# Patient Record
Sex: Male | Born: 1960
Health system: Southern US, Community
[De-identification: ages and names within clinical notes are randomized; demographics above are authoritative.]

## PROBLEM LIST (undated history)

## (undated) DIAGNOSIS — I700 Atherosclerosis of aorta: Secondary | ICD-10-CM

## (undated) DIAGNOSIS — K3184 Gastroparesis: Secondary | ICD-10-CM

## (undated) DIAGNOSIS — J9690 Respiratory failure, unspecified, unspecified whether with hypoxia or hypercapnia: Secondary | ICD-10-CM

## (undated) DIAGNOSIS — T8859XA Other complications of anesthesia, initial encounter: Secondary | ICD-10-CM

## (undated) DIAGNOSIS — I4891 Unspecified atrial fibrillation: Secondary | ICD-10-CM

## (undated) DIAGNOSIS — I255 Ischemic cardiomyopathy: Secondary | ICD-10-CM

## (undated) DIAGNOSIS — Z720 Tobacco use: Secondary | ICD-10-CM

## (undated) DIAGNOSIS — M75100 Unspecified rotator cuff tear or rupture of unspecified shoulder, not specified as traumatic: Secondary | ICD-10-CM

## (undated) DIAGNOSIS — I10 Essential (primary) hypertension: Secondary | ICD-10-CM

## (undated) DIAGNOSIS — Z951 Presence of aortocoronary bypass graft: Secondary | ICD-10-CM

## (undated) DIAGNOSIS — T148XXA Other injury of unspecified body region, initial encounter: Secondary | ICD-10-CM

## (undated) DIAGNOSIS — R3129 Other microscopic hematuria: Secondary | ICD-10-CM

## (undated) DIAGNOSIS — C801 Malignant (primary) neoplasm, unspecified: Secondary | ICD-10-CM

## (undated) DIAGNOSIS — E119 Type 2 diabetes mellitus without complications: Secondary | ICD-10-CM

## (undated) DIAGNOSIS — M869 Osteomyelitis, unspecified: Secondary | ICD-10-CM

## (undated) DIAGNOSIS — I272 Pulmonary hypertension, unspecified: Secondary | ICD-10-CM

## (undated) DIAGNOSIS — E739 Lactose intolerance, unspecified: Secondary | ICD-10-CM

## (undated) DIAGNOSIS — D696 Thrombocytopenia, unspecified: Secondary | ICD-10-CM

## (undated) DIAGNOSIS — E785 Hyperlipidemia, unspecified: Secondary | ICD-10-CM

## (undated) DIAGNOSIS — I517 Cardiomegaly: Secondary | ICD-10-CM

## (undated) DIAGNOSIS — I2510 Atherosclerotic heart disease of native coronary artery without angina pectoris: Secondary | ICD-10-CM

## (undated) DIAGNOSIS — G2581 Restless legs syndrome: Secondary | ICD-10-CM

## (undated) DIAGNOSIS — N189 Chronic kidney disease, unspecified: Secondary | ICD-10-CM

## (undated) DIAGNOSIS — Z9889 Other specified postprocedural states: Secondary | ICD-10-CM

## (undated) DIAGNOSIS — Z794 Long term (current) use of insulin: Secondary | ICD-10-CM

## (undated) DIAGNOSIS — M75102 Unspecified rotator cuff tear or rupture of left shoulder, not specified as traumatic: Secondary | ICD-10-CM

## (undated) DIAGNOSIS — I5020 Unspecified systolic (congestive) heart failure: Secondary | ICD-10-CM

## (undated) DIAGNOSIS — Z7982 Long term (current) use of aspirin: Secondary | ICD-10-CM

## (undated) DIAGNOSIS — I509 Heart failure, unspecified: Secondary | ICD-10-CM

## (undated) DIAGNOSIS — G629 Polyneuropathy, unspecified: Secondary | ICD-10-CM

## (undated) DIAGNOSIS — G250 Essential tremor: Secondary | ICD-10-CM

## (undated) DIAGNOSIS — M79601 Pain in right arm: Secondary | ICD-10-CM

## (undated) DIAGNOSIS — J449 Chronic obstructive pulmonary disease, unspecified: Secondary | ICD-10-CM

## (undated) DIAGNOSIS — I219 Acute myocardial infarction, unspecified: Secondary | ICD-10-CM

## (undated) DIAGNOSIS — G4730 Sleep apnea, unspecified: Secondary | ICD-10-CM

## (undated) DIAGNOSIS — C4491 Basal cell carcinoma of skin, unspecified: Secondary | ICD-10-CM

## (undated) DIAGNOSIS — M12812 Other specific arthropathies, not elsewhere classified, left shoulder: Secondary | ICD-10-CM

## (undated) DIAGNOSIS — R51 Headache: Secondary | ICD-10-CM

## (undated) DIAGNOSIS — F341 Dysthymic disorder: Secondary | ICD-10-CM

## (undated) HISTORY — DX: Tobacco use: Z72.0

## (undated) HISTORY — DX: Unspecified atrial fibrillation: I48.91

## (undated) HISTORY — PX: ROTATOR CUFF REPAIR: SHX139

## (undated) HISTORY — DX: Atherosclerotic heart disease of native coronary artery without angina pectoris: I25.10

## (undated) HISTORY — DX: Ischemic cardiomyopathy: I25.5

## (undated) HISTORY — DX: Respiratory failure, unspecified, unspecified whether with hypoxia or hypercapnia: J96.90

## (undated) HISTORY — DX: Hyperlipidemia, unspecified: E78.5

## (undated) HISTORY — PX: HERNIA REPAIR: SHX51

---

## 2011-11-26 NOTE — H&P (Signed)
[name] DOB: 08-25-1961  Chief Complaint:  Right shoulder pain  History of Present Illness The patient is a 51 year old male who is scheduled for an open right rotator cuff repair on Friday Dec 04, 2011. They are right handed and present today reporting pain at the right shoulder that began one month ago. The patient reports that the shoulder symptoms began following a specific injury. The injury resulted from a fall onto the arm. The patient reports symptoms which include shoulder pain, night pain and inability to lay on that side. The patient reports symptoms that radiate to the right upper arm. The patient describes these symptoms as moderate in severity. Symptoms are exacerbated by motion at the shoulder and lifting. MRI revealed full thickness tears of the supraspinatus and infraspinatus.   Allergies No Known Drug Allergies. 10/31/2011   Family History Congestive Heart Failure. father Depression. sister Diabetes Mellitus. father Heart Disease. father Heart disease in male family member before age 80 Hypertension. father Rheumatoid Arthritis. sister   Social History Alcohol use. current drinker; drinks beer Children. 2 Current work status. working full time Drug/Alcohol Rehab (Currently). no Drug/Alcohol Rehab (Previously). no Exercise. Exercises rarely; does running / walking Illicit drug use. no Living situation. live with spouse Marital status. married Number of flights of stairs before winded. greater than 5 Pain Contract. no Tobacco / smoke exposure. yes Tobacco use. current every day smoker; smoke(d) 1 pack(s) per day   Medication History No Current Medications.   Past Surgical History Arthroscopy of Shoulder. left   Review of Systems General:Not Present- Chills, Fever, Night Sweats, Appetite Loss, Fatigue, Feeling sick, Weight Gain and Weight Loss. Skin:Not Present- Itching, Rash, Skin Color Changes, Ulcer,  Psoriasis and Change in Hair or Nails. HEENT:Not Present- Sensitivity to light, Hearing problems, Nose Bleed and Ringing in the Ears. Neck:Not Present- Swollen Glands and Neck Mass. Respiratory:Not Present- Snoring, Chronic Cough, Bloody sputum and Dyspnea. Cardiovascular:Not Present- Shortness of Breath, Chest Pain, Swelling of Extremities, Leg Cramps and Palpitations. Gastrointestinal:Not Present- Bloody Stool, Heartburn, Abdominal Pain, Vomiting, Nausea and Incontinence of Stool. Male Genitourinary:Not Present- Blood in Urine, Frequency, Incontinence and Nocturia. Musculoskeletal:Present- Joint Pain. Not Present- Muscle Weakness, Muscle Pain, Joint Stiffness, Joint Swelling and Back Pain. Neurological:Not Present- Tingling, Numbness, Burning, Tremor, Headaches and Dizziness. Psychiatric:Not Present- Anxiety, Depression and Memory Loss. Endocrine:Not Present- Cold Intolerance, Heat Intolerance, Excessive hunger and Excessive Thirst. Hematology:Not Present- Abnormal Bleeding, Anemia, Blood Clots and Easy Bruising.   Vitals 10/31/2011 9:32 AM Weight: 166 lb Height: 66 in Body Surface Area: 1.87 m Body Mass Index: 26.79 kg/m Pulse: 92 (Regular) BP: 155/94 (Sitting, Left Arm, Standard)    Physical Exam Exam shows he has pain with all ranges of motion of his shoulder. He has swelling anteriorly. Weakness of the right shoulder against resistance while in flexion and abduction at 90 degrees. His biceps and triceps are intact. AC joint is normal. He has good circulation. Good function in his hand. No masses in the axillary region or supraclavicular area or rhomboids.   Assessment & Plan Complete rotator cuff rupture, non-traumatic (727.61) Open right rotator cuff repair We may or may not need to use a graft material that is made from calf skin. This is approved by the FDA and I have not had a rejection of that graft yet. Also, we may need to use anchors. These are  polyethylene anchors that stay in the bone and we use those anchors to suture the tendon down in certain cases where  the tendon is completely pulled off the bone. There is always a chance of a secondary infection obviously with any surgery but we do use antibiotics preop.   Dimitri Ped, PA-C

## 2011-12-02 ENCOUNTER — Encounter (HOSPITAL_COMMUNITY): Payer: Self-pay | Admitting: Pharmacy Technician

## 2011-12-02 ENCOUNTER — Encounter (HOSPITAL_COMMUNITY): Payer: Self-pay

## 2011-12-02 ENCOUNTER — Encounter (HOSPITAL_COMMUNITY)
Admission: RE | Admit: 2011-12-02 | Discharge: 2011-12-02 | Disposition: A | Discharge status: Home / Self Care | Payer: Managed Care, Other (non HMO) | Source: Ambulatory Visit | Attending: Orthopedic Surgery | Admitting: Orthopedic Surgery

## 2011-12-02 DIAGNOSIS — Z01812 Encounter for preprocedural laboratory examination: Secondary | ICD-10-CM | POA: Insufficient documentation

## 2011-12-02 DIAGNOSIS — Z01818 Encounter for other preprocedural examination: Secondary | ICD-10-CM | POA: Insufficient documentation

## 2011-12-02 HISTORY — DX: Lactose intolerance, unspecified: E73.9

## 2011-12-02 HISTORY — DX: Headache: R51

## 2011-12-02 HISTORY — DX: Unspecified rotator cuff tear or rupture of unspecified shoulder, not specified as traumatic: M75.100

## 2011-12-02 HISTORY — DX: Other injury of unspecified body region, initial encounter: T14.8XXA

## 2011-12-02 HISTORY — DX: Other microscopic hematuria: R31.29

## 2011-12-02 LAB — URINALYSIS, ROUTINE W REFLEX MICROSCOPIC
Bilirubin Urine: NEGATIVE
Glucose, UA: >1000 mg/dL — AB
Hgb urine dipstick: NEGATIVE
Ketones, ur: NEGATIVE mg/dL
Leukocytes, UA: NEGATIVE
Nitrite: NEGATIVE
Protein, ur: NEGATIVE mg/dL
Specific Gravity, Urine: 1.034 — ABNORMAL HIGH (ref 1.005–1.030)
Urobilinogen, UA: 1 mg/dL (ref 0.0–1.0)
pH: 5.5 (ref 5.0–8.0)

## 2011-12-02 LAB — COMPREHENSIVE METABOLIC PANEL
ALT: 68 U/L — ABNORMAL HIGH (ref 0–53)
AST: 49 U/L — ABNORMAL HIGH (ref 0–37)
Albumin: 3.9 g/dL (ref 3.5–5.2)
Alkaline Phosphatase: 142 U/L — ABNORMAL HIGH (ref 39–117)
BUN: 11 mg/dL (ref 6–23)
CO2: 27 mEq/L (ref 19–32)
Calcium: 9.1 mg/dL (ref 8.4–10.5)
Chloride: 101 mEq/L (ref 96–112)
Creatinine, Ser: 0.58 mg/dL (ref 0.50–1.35)
GFR calc Af Amer: >90 mL/min (ref >90)
GFR calc non Af Amer: >90 mL/min (ref >90)
Glucose, Bld: 232 mg/dL — ABNORMAL HIGH (ref 70–99)
Potassium: 3.3 mEq/L — ABNORMAL LOW (ref 3.5–5.1)
Sodium: 139 mEq/L (ref 135–145)
Total Bilirubin: 0.6 mg/dL (ref 0.3–1.2)
Total Protein: 7.5 g/dL (ref 6.0–8.3)

## 2011-12-02 LAB — DIFFERENTIAL
Basophils Absolute: 0 10*3/uL (ref 0.0–0.1)
Basophils Relative: 0 % (ref 0–1)
Eosinophils Absolute: 0.3 10*3/uL (ref 0.0–0.7)
Eosinophils Relative: 3 % (ref 0–5)
Lymphocytes Relative: 33 % (ref 12–46)
Lymphs Abs: 3 10*3/uL (ref 0.7–4.0)
Monocytes Absolute: 0.7 10*3/uL (ref 0.1–1.0)
Monocytes Relative: 8 % (ref 3–12)
Neutro Abs: 5.2 10*3/uL (ref 1.7–7.7)
Neutrophils Relative %: 56 % (ref 43–77)

## 2011-12-02 LAB — CBC
HCT: 45.1 % (ref 39.0–52.0)
Hemoglobin: 16.1 g/dL (ref 13.0–17.0)
MCH: 29.2 pg (ref 26.0–34.0)
MCHC: 35.7 g/dL (ref 30.0–36.0)
MCV: 81.9 fL (ref 78.0–100.0)
Platelets: 127 10*3/uL — ABNORMAL LOW (ref 150–400)
RBC: 5.51 MIL/uL (ref 4.22–5.81)
RDW: 12.3 % (ref 11.5–15.5)
WBC: 9.2 10*3/uL (ref 4.0–10.5)

## 2011-12-02 LAB — URINE MICROSCOPIC-ADD ON

## 2011-12-02 LAB — PROTIME-INR
INR: 0.99 (ref 0.00–1.49)
Prothrombin Time: 13.3 seconds (ref 11.6–15.2)

## 2011-12-02 LAB — APTT: aPTT: 29 seconds (ref 24–37)

## 2011-12-02 LAB — SURGICAL PCR SCREEN: MRSA, PCR: POSITIVE — AB

## 2011-12-02 NOTE — Patient Instructions (Signed)
20 [name]  12/02/2011   Your procedure is scheduled on:  12/04/11  Report to [facility] at 5:30 AM.  Call this number if you have problems the morning of surgery: [phone]   Remember:   Do not eat food or drink liquids :After Midnight.    Clear liquids include soda, tea, black coffee, apple or grape juice, broth.  Take these medicines the morning of surgery with A SIP OF WATER: none   Do not wear jewelry, make-up or nail polish.  Do not wear lotions, powders, or perfumes.   Do not shave underarms or legs 48 hours prior to surgery (men may shave face)  Do not bring valuables to the hospital.  Contacts, dentures or bridgework may not be worn into surgery.  Leave suitcase in the car. After surgery it may be brought to your room.  For patients admitted to the hospital, checkout time is 11:00 AM the day of discharge.   Patients discharged the day of surgery will not be allowed to drive home.  Name and phone number of your driver:   Special Instructions: CHG Shower Use Special Wash: 1/2 bottle night before surgery and 1/2 bottle morning of surgery.   Please read over the following fact sheets that you were given: MRSA Information / insentive spirometer information  20 [name]  12/02/2011

## 2011-12-03 NOTE — Pre-Procedure Instructions (Signed)
SPOKE WITH TAMMY AT DR GIOFFRE'S OFFICE CONCERNING ABNORMAL LABS - SHE SPOKE WITH DR GIOFFRE AND STATES PT SURGERY WILL BE CANCELLED FOR FURTHER WORK UP BY PRIMARY MD

## 2011-12-04 ENCOUNTER — Encounter (HOSPITAL_COMMUNITY): Admission: RE | Payer: Self-pay | Source: Ambulatory Visit

## 2011-12-04 ENCOUNTER — Ambulatory Visit (HOSPITAL_COMMUNITY)
Admission: RE | Admit: 2011-12-04 | Payer: Managed Care, Other (non HMO) | Source: Ambulatory Visit | Admitting: Orthopedic Surgery

## 2011-12-04 SURGERY — REPAIR, ROTATOR CUFF, OPEN
Anesthesia: General | Laterality: Right

## 2011-12-07 ENCOUNTER — Ambulatory Visit (INDEPENDENT_AMBULATORY_CARE_PROVIDER_SITE_OTHER): Payer: Managed Care, Other (non HMO) | Admitting: Family

## 2011-12-07 ENCOUNTER — Ambulatory Visit (INDEPENDENT_AMBULATORY_CARE_PROVIDER_SITE_OTHER)
Admission: RE | Admit: 2011-12-07 | Discharge: 2011-12-07 | Disposition: A | Discharge status: Home / Self Care | Payer: Managed Care, Other (non HMO) | Source: Ambulatory Visit | Attending: Family | Admitting: Family

## 2011-12-07 ENCOUNTER — Encounter: Payer: Self-pay | Admitting: Family

## 2011-12-07 VITALS — BP 148/88 | Ht 65.25 in | Wt 164.0 lb

## 2011-12-07 DIAGNOSIS — F17200 Nicotine dependence, unspecified, uncomplicated: Secondary | ICD-10-CM

## 2011-12-07 DIAGNOSIS — M75101 Unspecified rotator cuff tear or rupture of right shoulder, not specified as traumatic: Secondary | ICD-10-CM

## 2011-12-07 DIAGNOSIS — R7309 Other abnormal glucose: Secondary | ICD-10-CM

## 2011-12-07 DIAGNOSIS — R739 Hyperglycemia, unspecified: Secondary | ICD-10-CM

## 2011-12-07 DIAGNOSIS — Z720 Tobacco use: Secondary | ICD-10-CM

## 2011-12-07 DIAGNOSIS — S43429A Sprain of unspecified rotator cuff capsule, initial encounter: Secondary | ICD-10-CM

## 2011-12-07 LAB — BASIC METABOLIC PANEL
BUN: 16 mg/dL (ref 6–23)
Creatinine, Ser: 0.7 mg/dL (ref 0.4–1.5)
GFR: 120.63 mL/min (ref >60.00)

## 2011-12-07 LAB — HEMOGLOBIN A1C: Hgb A1c MFr Bld: 13 % — ABNORMAL HIGH (ref 4.6–6.5)

## 2011-12-07 NOTE — Patient Instructions (Signed)

## 2011-12-07 NOTE — Progress Notes (Signed)
Subjective:    Patient ID: [name], male    DOB: October 16, 1961, 51 y.o.   MRN: [id number]  HPI 51 year old new patient is in today requesting surgical clearance. He was scheduled to undergo rotator cuff repair related to a fall on 09/28/2011, the orthopedist obtained blood work that revealed a glucose of 247. Patient has no known history of type 2 diabetes. Denies any urinary frequency, urgency, polyuria, polydipsia. He is a smoker of 1-1/2 packs of cigarettes a day. He has a family history of type 2 diabetes. His last complete physical exam is unknown.   Review of Systems  Constitutional: Negative.   HENT: Negative.   Respiratory: Negative.   Cardiovascular: Negative.   Gastrointestinal: Negative.   Genitourinary: Negative.   Musculoskeletal: Positive for arthralgias.       Right rotator cuff tear  Skin: Negative.   Neurological: Negative.   Hematological: Negative.   Psychiatric/Behavioral: Negative.    Past Medical History  Diagnosis Date  . Headache   . Rotator cuff tear RIGHT  . Lactose intolerance   . Abrasion L FOREARM  . Hematuria, microscopic "SINCE I WAS A KID"    History   Social History  . Marital Status: Married    Spouse Name: N/A    Number of Children: N/A  . Years of Education: N/A   Occupational History  . Not on file.   Social History Main Topics  . Smoking status: Current Everyday Smoker -- 1.5 packs/day  . Smokeless tobacco: Not on file  . Alcohol Use: No  . Drug Use: No  . Sexually Active:    Other Topics Concern  . Not on file   Social History Narrative  . No narrative on file    Past Surgical History  Procedure Date  . Hernia repair X2  . Rotator cuff repair LEFT    Family History  Problem Relation Age of Onset  . Hypertension Mother   . Hypertension Father   . Sudden death Father   . Diabetes Father     No Known Allergies  Current Outpatient Prescriptions on File Prior to Visit  Medication Sig Dispense Refill  .  ibuprofen (ADVIL,MOTRIN) 200 MG tablet Take 400 mg by mouth every 6 (six) hours as needed. Pain        BP 148/88  Ht 5' 5.25" (1.657 m)  Wt 164 lb (74.39 kg)  BMI 27.08 kg/m2chart    Objective:   Physical Exam  Constitutional: He is oriented to person, place, and time. He appears well-developed and well-nourished.  HENT:  Right Ear: External ear normal.  Left Ear: External ear normal.  Nose: Nose normal.  Mouth/Throat: Oropharynx is clear and moist.  Neck: Normal range of motion. Neck supple.  Cardiovascular: Normal rate, regular rhythm and normal heart sounds.   Pulmonary/Chest: Effort normal and breath sounds normal.  Abdominal: Soft. Bowel sounds are normal.  Musculoskeletal: Normal range of motion.       Decreased ROM noted to the right rotator shoulder.   Neurological: He is alert and oriented to person, place, and time.  Skin: Skin is warm and dry.  Psychiatric: He has a normal mood and affect.      EKG: Within normal limits    Assessment & and Plan:  Assessment: Tobacco abuse, rotator cuff tear, hyperglycemia  Plan: Labs sent BMP and an A1c will notify patient pending results. Chest x-ray ordered. encouraged healthy diet and exercise, smoking cessation. Parents will be provided pending the results of  his imaging studies and labs.

## 2011-12-09 ENCOUNTER — Encounter: Payer: Self-pay | Admitting: Family

## 2011-12-09 ENCOUNTER — Ambulatory Visit (INDEPENDENT_AMBULATORY_CARE_PROVIDER_SITE_OTHER): Payer: Managed Care, Other (non HMO) | Admitting: Family

## 2011-12-09 VITALS — BP 142/90 | HR 107 | Temp 98.2°F | Ht 65.25 in | Wt 164.0 lb

## 2011-12-09 DIAGNOSIS — R030 Elevated blood-pressure reading, without diagnosis of hypertension: Secondary | ICD-10-CM

## 2011-12-09 DIAGNOSIS — M75101 Unspecified rotator cuff tear or rupture of right shoulder, not specified as traumatic: Secondary | ICD-10-CM

## 2011-12-09 DIAGNOSIS — S43429A Sprain of unspecified rotator cuff capsule, initial encounter: Secondary | ICD-10-CM

## 2011-12-09 DIAGNOSIS — E1165 Type 2 diabetes mellitus with hyperglycemia: Secondary | ICD-10-CM

## 2011-12-09 MED ORDER — TRAMADOL HCL 50 MG PO TABS: 50.0000 mg | ORAL_TABLET | Freq: Two times a day (BID) | ORAL | Status: AC | PRN

## 2011-12-09 MED ORDER — METFORMIN HCL 850 MG PO TABS: 850.0000 mg | ORAL_TABLET | Freq: Two times a day (BID) | ORAL | Status: DC

## 2011-12-09 NOTE — Progress Notes (Signed)
Subjective:    Patient ID: [name], male    DOB: 23-Sep-1961, 51 y.o.   MRN: [id number]  HPI 51 year old white male, smoker, newly diagnosed type II diabetic in for treatment today. He presented at his last office visit for surgical clearance to have a right rotator cuff repair. On his labs, he was found to have a blood sugar of 293-fasting, and an A1c of 13.0. Patient has never had any treatment for type 2 diabetes. He does report an increase in urination and thirst. He has a family history of type 2 diabetes. Denies any lightheadedness, blurred vision, double vision, dizziness, chest pain, palpitations, shortness of breath or edema.  He routinely has yearly eye exams.  Review of Systems  Constitutional: Negative.   HENT: Negative.   Respiratory: Negative.   Cardiovascular: Negative.   Gastrointestinal: Negative.   Genitourinary: Negative.   Musculoskeletal:       Right shoulder pain  Skin: Negative.   Neurological: Negative.   Hematological: Negative.   Psychiatric/Behavioral: Negative.    Past Medical History  Diagnosis Date  . Headache   . Rotator cuff tear RIGHT  . Lactose intolerance   . Abrasion L FOREARM  . Hematuria, microscopic "SINCE I WAS A KID"    History   Social History  . Marital Status: Married    Spouse Name: N/A    Number of Children: N/A  . Years of Education: N/A   Occupational History  . Not on file.   Social History Main Topics  . Smoking status: Current Everyday Smoker -- 1.5 packs/day  . Smokeless tobacco: Not on file  . Alcohol Use: No  . Drug Use: No  . Sexually Active:    Other Topics Concern  . Not on file   Social History Narrative  . No narrative on file    Past Surgical History  Procedure Date  . Hernia repair X2  . Rotator cuff repair LEFT    Family History  Problem Relation Age of Onset  . Hypertension Mother   . Hypertension Father   . Sudden death Father   . Diabetes Father     No Known  Allergies  Current Outpatient Prescriptions on File Prior to Visit  Medication Sig Dispense Refill  . ibuprofen (ADVIL,MOTRIN) 200 MG tablet Take 400 mg by mouth every 6 (six) hours as needed. Pain        BP 142/90  Pulse 107  Temp(Src) 98.2 F (36.8 C) (Oral)  Ht 5' 5.25" (1.657 m)  Wt 164 lb (74.39 kg)  BMI 27.08 kg/m2  SpO2 95%chart    Objective:   Physical Exam  Constitutional: He is oriented to person, place, and time. He appears well-developed and well-nourished.  HENT:  Right Ear: External ear normal.  Left Ear: External ear normal.  Nose: Nose normal.  Mouth/Throat: Oropharynx is clear and moist.  Eyes: Pupils are equal, round, and reactive to light.  Cardiovascular: Normal rate, regular rhythm and normal heart sounds.   Pulmonary/Chest: Effort normal and breath sounds normal.  Musculoskeletal: Normal range of motion.  Neurological: He is alert and oriented to person, place, and time.  Skin: Skin is warm and dry.  Psychiatric: He has a normal mood and affect.          Assessment & Plan:  Assessment: Type 2 diabetes-newly diagnosed, uncontrolled  Plan: Start metformin 850 mg 1 by mouth twice a day. Patient will check his blood sugars twice daily once fasting, and 2 hours postprandially.  Patient is currently out of work due to his right rotator cuff and said this has complicated and delayed his surgery, he will bring short-term disability paperwork to be completed. Encouraged healthy diet, exercise, avoidance of starches and sugars. The patient extensively about dietary recommendations. Instructions on how to use the glucometer were performed by medical assistant. We'll bring patient back for recheck in 4 weeks and sooner when necessary. At next office visit, will obtain fasting cholesterol and blood glucose.

## 2011-12-09 NOTE — Patient Instructions (Signed)
Diabetes, Type 2 Diabetes is a long-lasting (chronic) disease. In type 2 diabetes, the pancreas does not make enough insulin (a hormone), and the body does not respond normally to the insulin that is made. This type of diabetes was also previously called adult-onset diabetes. It usually occurs after the age of 29, but it can occur at any age.  CAUSES  Type 2 diabetes happens because the pancreasis not making enough insulin or your body has trouble using the insulin that your pancreas does make properly. SYMPTOMS   Drinking more than usual.   Urinating more than usual.   Blurred vision.   Dry, itchy skin.   Frequent infections.   Feeling more tired than usual (fatigue).  DIAGNOSIS The diagnosis of type 2 diabetes is usually made by one of the following tests:  Fasting blood glucose test. You will not eat for at least 8 hours and then take a blood test.   Random blood glucose test. Your blood glucose (sugar) is checked at any time of the day regardless of when you ate.   Oral glucose tolerance test (OGTT). Your blood glucose is measured after you have not eaten (fasted) and then after you drink a glucose containing beverage.  TREATMENT   Healthy eating.   Exercise.   Medicine, if needed.   Monitoring blood glucose.   Seeing your caregiver regularly.  HOME CARE INSTRUCTIONS   Check your blood glucose at least once a day. More frequent monitoring may be necessary, depending on your medicines and on how well your diabetes is controlled. Your caregiver will advise you.   Take your medicine as directed by your caregiver.   Do not smoke.   Make wise food choices. Ask your caregiver for information. Weight loss can improve your diabetes.   Learn about low blood glucose (hypoglycemia) and how to treat it.   Get your eyes checked regularly.   Have a yearly physical exam. Have your blood pressure checked and your blood and urine tested.   Wear a pendant or bracelet saying  that you have diabetes.   Check your feet every night for cuts, sores, blisters, and redness. Let your caregiver know if you have any problems.  SEEK MEDICAL CARE IF:   You have problems keeping your blood glucose in target range.   You have problems with your medicines.   You have symptoms of an illness that do not improve after 24 hours.   You have a sore or wound that is not healing.   You notice a change in vision or a new problem with your vision.   You have a fever.  MAKE SURE YOU:  Understand these instructions.   Will watch your condition.   Will get help right away if you are not doing well or get worse.  Document Released: 10/05/2005 Document Revised: 06/18/2011 Document Reviewed: 03/23/2011 [facility] Patient Information 2012 [place], [place].  Exercise to Stay Healthy Exercise helps you become and stay healthy. EXERCISE IDEAS AND TIPS Choose exercises that:  You enjoy.   Fit into your day.  You do not need to exercise really hard to be healthy. You can do exercises at a slow or medium level and stay healthy. You can:  Stretch before and after working out.   Try yoga, Pilates, or tai chi.   Lift weights.   Walk fast, swim, jog, run, climb stairs, bicycle, dance, or rollerskate.   Take aerobic classes.  Exercises that burn about 150 calories:  Running 1  miles  in 15 minutes.   Playing volleyball for 45 to 60 minutes.   Washing and waxing a car for 45 to 60 minutes.   Playing touch football for 45 minutes.   Walking 1  miles in 35 minutes.   Pushing a stroller 1  miles in 30 minutes.   Playing basketball for 30 minutes.   Raking leaves for 30 minutes.   Bicycling 5 miles in 30 minutes.   Walking 2 miles in 30 minutes.   Dancing for 30 minutes.   Shoveling snow for 15 minutes.   Swimming laps for 20 minutes.   Walking up stairs for 15 minutes.   Bicycling 4 miles in 15 minutes.   Gardening for 30 to 45 minutes.   Jumping rope for  15 minutes.   Washing windows or floors for 45 to 60 minutes.  Document Released: 11/07/2010 Document Revised: 06/17/2011 Document Reviewed: 11/07/2010 [facility] Patient Information 2012 [place], [place].

## 2011-12-15 ENCOUNTER — Telehealth: Payer: Self-pay | Admitting: Family

## 2011-12-15 NOTE — Telephone Encounter (Signed)
Pt needs a script for the test strips for Contour Next Blood Glucose meter. Pls call in test strips to [facility] on [address].

## 2011-12-16 ENCOUNTER — Telehealth: Payer: Self-pay | Admitting: Family

## 2011-12-16 MED ORDER — GLUCOSE BLOOD VI STRP: ORAL_STRIP | Status: DC

## 2011-12-16 MED ORDER — GLUCOSE BLOOD VI STRP: ORAL_STRIP | Status: AC

## 2011-12-16 NOTE — Telephone Encounter (Signed)
Almira Coaster from [facility] called stating that patient's rx was sent to the wrong [facility] and the directions read use as directed and can not be filled that way. Please assist and contact Pharmacy [phone].

## 2011-12-16 NOTE — Telephone Encounter (Signed)
Pharmacy changed and Rx sent with directions

## 2011-12-16 NOTE — Telephone Encounter (Signed)
Rx sent to pharamcy

## 2011-12-23 ENCOUNTER — Telehealth: Payer: Self-pay

## 2011-12-23 NOTE — Telephone Encounter (Signed)
Left message to notify pt to call office and schedule an OV to assess his blood sugar for surgery clearance.

## 2011-12-24 NOTE — Telephone Encounter (Signed)
Left another message to notify pt to call office

## 2011-12-28 NOTE — Telephone Encounter (Signed)
Left message for pt to call office and schedule appointment if he would like to come in for surgery clearance

## 2012-01-14 ENCOUNTER — Telehealth: Payer: Self-pay | Admitting: Family

## 2012-01-14 NOTE — Telephone Encounter (Signed)
Patient's sister called stating that she would like the FNP to call and make her brother come in for a follow up visit. She states that they checked his bs unaware and it was over 300 and patient has tested positive for prostate cancer and has cancer spots on his face. Patient's sister also states that patient is working long days and hours and is somewhat in denial of health. FNP notified.

## 2012-01-14 NOTE — Telephone Encounter (Signed)
Attempted to call pt to no avail. Left detailed message to advise pt that I was calling to check on him and to see if we could schedule a follow up appointment to evaluate his blood sugars and health status. Advised pt to call office to schedule f/u

## 2012-01-20 ENCOUNTER — Encounter: Payer: Self-pay | Admitting: Family

## 2012-01-20 ENCOUNTER — Ambulatory Visit (INDEPENDENT_AMBULATORY_CARE_PROVIDER_SITE_OTHER): Payer: Managed Care, Other (non HMO) | Admitting: Family

## 2012-01-20 VITALS — BP 140/88 | Temp 98.6°F | Wt 167.0 lb

## 2012-01-20 DIAGNOSIS — S43429A Sprain of unspecified rotator cuff capsule, initial encounter: Secondary | ICD-10-CM

## 2012-01-20 DIAGNOSIS — M75100 Unspecified rotator cuff tear or rupture of unspecified shoulder, not specified as traumatic: Secondary | ICD-10-CM

## 2012-01-20 DIAGNOSIS — E1165 Type 2 diabetes mellitus with hyperglycemia: Secondary | ICD-10-CM

## 2012-01-20 MED ORDER — METFORMIN HCL 1000 MG PO TABS: 1000.0000 mg | ORAL_TABLET | Freq: Two times a day (BID) | ORAL | Status: DC

## 2012-01-20 NOTE — Patient Instructions (Signed)

## 2012-01-21 ENCOUNTER — Ambulatory Visit: Payer: Managed Care, Other (non HMO) | Admitting: Family

## 2012-01-21 NOTE — Progress Notes (Signed)
Subjective:    Patient ID: [name], male    DOB: 06/15/1961, 51 y.o.   MRN: [id number]  HPI  51 year old white male comes in for recheck of type 2 diabetes and surgical clearance. He was recently diagnosed and found to have an A1c of 13.0. Started on metformin a 875 twice a day. He is doing much better now. Fasting blood sugars 160-180 and Post prandial reading 200-220. He has decreased intake of sodas from 15 cans per day to 2-3 a day. He is not exercising. He is also requesting a 2nd opinion for his Rotator cuff surgery.    Review of Systems  Constitutional: Negative.   HENT: Negative.   Respiratory: Negative.   Cardiovascular: Negative.   Gastrointestinal: Negative.   Genitourinary: Negative.   Musculoskeletal:       Torn rotator cuff, right  Skin: Negative.   Neurological: Negative.   Hematological: Negative.   Psychiatric/Behavioral: Negative.    Past Medical History  Diagnosis Date  . Headache   . Rotator cuff tear RIGHT  . Lactose intolerance   . Abrasion L FOREARM  . Hematuria, microscopic "SINCE I WAS A KID"    History   Social History  . Marital Status: Married    Spouse Name: N/A    Number of Children: N/A  . Years of Education: N/A   Occupational History  . Not on file.   Social History Main Topics  . Smoking status: Current Everyday Smoker -- 1.5 packs/day  . Smokeless tobacco: Not on file  . Alcohol Use: No  . Drug Use: No  . Sexually Active:    Other Topics Concern  . Not on file   Social History Narrative  . No narrative on file    Past Surgical History  Procedure Date  . Hernia repair X2  . Rotator cuff repair LEFT    Family History  Problem Relation Age of Onset  . Hypertension Mother   . Hypertension Father   . Sudden death Father   . Diabetes Father     No Known Allergies  Current Outpatient Prescriptions on File Prior to Visit  Medication Sig Dispense Refill  . glucose blood (BAYER CONTOUR NEXT TEST) test strip  Check blood sugar bid. Once fasting and once 2 hours after a meal  100 each  12  . ibuprofen (ADVIL,MOTRIN) 200 MG tablet Take 400 mg by mouth every 6 (six) hours as needed. Pain      . metFORMIN (GLUCOPHAGE) 1000 MG tablet Take 1 tablet (1,000 mg total) by mouth 2 (two) times daily with a meal.  60 tablet  3    BP 140/88  Temp(Src) 98.6 F (37 C) (Oral)  Wt 167 lb (75.751 kg)chart    Objective:   Physical Exam  Constitutional: He is oriented to person, place, and time. He appears well-developed and well-nourished.  HENT:  Right Ear: External ear normal.  Left Ear: External ear normal.  Nose: Nose normal.  Mouth/Throat: Oropharynx is clear and moist.  Eyes: Pupils are equal, round, and reactive to light.  Neck: Normal range of motion.  Cardiovascular: Normal rate, regular rhythm and normal heart sounds.   Pulmonary/Chest: Effort normal and breath sounds normal.  Abdominal: Soft.  Musculoskeletal: Normal range of motion.  Neurological: He is alert and oriented to person, place, and time.  Skin: Skin is warm and dry.  Psychiatric: He has a normal mood and affect.    Post prandial BG: 193  Assessment & Plan:  Assessment: Type 2 Diabetes, Rotator Cuff Tear  Plan: Refer to ortho. Increase Metformin to 1000 mg BID. Decrease intake of sodas. Recheck in 4 weeks. Healthy diet and exercise.

## 2012-03-18 DIAGNOSIS — Z0279 Encounter for issue of other medical certificate: Secondary | ICD-10-CM

## 2012-04-29 ENCOUNTER — Telehealth: Payer: Self-pay | Admitting: Family

## 2012-04-29 NOTE — Telephone Encounter (Signed)
Pt walked in and request a phone call. He needs a letter for his insurance company and would like to explain to you what the insurance company wants.

## 2012-04-29 NOTE — Telephone Encounter (Signed)
Pt requesting letter for insurance company stating that his rotator cuff surgery was postponed due to his uncontrolled diabetes.  Ok to write letter, per Oran Rein.  Pt aware letter ready to pick up

## 2012-06-23 ENCOUNTER — Other Ambulatory Visit: Payer: Self-pay

## 2012-06-23 MED ORDER — METFORMIN HCL 1000 MG PO TABS: 1000.0000 mg | ORAL_TABLET | Freq: Two times a day (BID) | ORAL | Status: DC

## 2012-08-17 ENCOUNTER — Ambulatory Visit: Payer: Managed Care, Other (non HMO) | Admitting: Family

## 2012-08-22 ENCOUNTER — Ambulatory Visit: Payer: Managed Care, Other (non HMO) | Admitting: Family

## 2012-08-23 ENCOUNTER — Ambulatory Visit (INDEPENDENT_AMBULATORY_CARE_PROVIDER_SITE_OTHER): Payer: Managed Care, Other (non HMO) | Admitting: Family

## 2012-08-23 ENCOUNTER — Encounter: Payer: Self-pay | Admitting: Family

## 2012-08-23 VITALS — BP 140/84 | HR 109 | Temp 98.4°F | Wt 166.0 lb

## 2012-08-23 DIAGNOSIS — E1165 Type 2 diabetes mellitus with hyperglycemia: Secondary | ICD-10-CM

## 2012-08-23 DIAGNOSIS — E1149 Type 2 diabetes mellitus with other diabetic neurological complication: Secondary | ICD-10-CM

## 2012-08-23 DIAGNOSIS — F17200 Nicotine dependence, unspecified, uncomplicated: Secondary | ICD-10-CM

## 2012-08-23 DIAGNOSIS — M25519 Pain in unspecified shoulder: Secondary | ICD-10-CM

## 2012-08-23 DIAGNOSIS — Z13220 Encounter for screening for lipoid disorders: Secondary | ICD-10-CM

## 2012-08-23 DIAGNOSIS — E1142 Type 2 diabetes mellitus with diabetic polyneuropathy: Secondary | ICD-10-CM

## 2012-08-23 DIAGNOSIS — Z720 Tobacco use: Secondary | ICD-10-CM

## 2012-08-23 DIAGNOSIS — M25511 Pain in right shoulder: Secondary | ICD-10-CM

## 2012-08-23 DIAGNOSIS — E119 Type 2 diabetes mellitus without complications: Secondary | ICD-10-CM

## 2012-08-23 LAB — GLUCOSE, POCT (MANUAL RESULT ENTRY): POC Glucose: 265 mg/dl — AB (ref 70–99)

## 2012-08-23 MED ORDER — METFORMIN HCL 1000 MG PO TABS: 1000.0000 mg | ORAL_TABLET | Freq: Two times a day (BID) | ORAL | Status: DC

## 2012-08-23 NOTE — Patient Instructions (Addendum)
Diabetes Meal Planning Guide The diabetes meal planning guide is a tool to help you plan your meals and snacks. It is important for people with diabetes to manage their blood glucose (sugar) levels. Choosing the right foods and the right amounts throughout your day will help control your blood glucose. Eating right can even help you improve your blood pressure and reach or maintain a healthy weight. CARBOHYDRATE COUNTING MADE EASY When you eat carbohydrates, they turn to sugar. This raises your blood glucose level. Counting carbohydrates can help you control this level so you feel better. When you plan your meals by counting carbohydrates, you can have more flexibility in what you eat and balance your medicine with your food intake. Carbohydrate counting simply means adding up the total amount of carbohydrate grams in your meals and snacks. Try to eat about the same amount at each meal. Foods with carbohydrates are listed below. Each portion below is 1 carbohydrate serving or 15 grams of carbohydrates. Ask your dietician how many grams of carbohydrates you should eat at each meal or snack. Grains and Starches  1 slice bread.   English muffin or hotdog/hamburger bun.   cup cold cereal (unsweetened).   cup cooked pasta or rice.   cup starchy vegetables (corn, potatoes, peas, beans, winter squash).  1 tortilla (6 inches).   bagel.  1 waffle or pancake (size of a CD).   cup cooked cereal.  4 to 6 small crackers. *Whole grain is recommended. Fruit  1 cup fresh unsweetened berries, melon, papaya, pineapple.  1 small fresh fruit.   banana or mango.   cup fruit juice (4 oz unsweetened).   cup canned fruit in natural juice or water.  2 tbs dried fruit.  12 to 15 grapes or cherries. Milk and Yogurt  1 cup fat-free or 1% milk.  1 cup soy milk.  6 oz light yogurt with sugar-free sweetener.  6 oz low-fat soy yogurt.  6 oz plain yogurt. Vegetables  1 cup raw or  cup  cooked is counted as 0 carbohydrates or a "free" food.  If you eat 3 or more servings at 1 meal, count them as 1 carbohydrate serving. Other Carbohydrates   oz chips or pretzels.   cup ice cream or frozen yogurt.   cup sherbet or sorbet.  2 inch square cake, no frosting.  1 tbs honey, sugar, jam, jelly, or syrup.  2 small cookies.  3 squares of graham crackers.  3 cups popcorn.  6 crackers.  1 cup broth-based soup.  Count 1 cup casserole or other mixed foods as 2 carbohydrate servings.  Foods with less than 20 calories in a serving may be counted as 0 carbohydrates or a "free" food. You may want to purchase a book or computer software that lists the carbohydrate gram counts of different foods. In addition, the nutrition facts panel on the labels of the foods you eat are a good source of this information. The label will tell you how big the serving size is and the total number of carbohydrate grams you will be eating per serving. Divide this number by 15 to obtain the number of carbohydrate servings in a portion. Remember, 1 carbohydrate serving equals 15 grams of carbohydrate. SERVING SIZES Measuring foods and serving sizes helps you make sure you are getting the right amount of food. The list below tells how big or small some common serving sizes are.  1 oz.........4 stacked dice.  3 oz.........Deck of cards.  1 tsp........Tip   of little finger.  1 tbs........Thumb.  2 tbs........Golf ball.   cup.......Half of a fist.  1 cup........A fist. SAMPLE DIABETES MEAL PLAN Below is a sample meal plan that includes foods from the grain and starches, dairy, vegetable, fruit, and meat groups. A dietician can individualize a meal plan to fit your calorie needs and tell you the number of servings needed from each food group. However, controlling the total amount of carbohydrates in your meal or snack is more important than making sure you include all of the food groups at every  meal. You may interchange carbohydrate containing foods (dairy, starches, and fruits). The meal plan below is an example of a 2000 calorie diet using carbohydrate counting. This meal plan has 17 carbohydrate servings. Breakfast  1 cup oatmeal (2 carb servings).   cup light yogurt (1 carb serving).  1 cup blueberries (1 carb serving).   cup almonds. Snack  1 large apple (2 carb servings).  1 low-fat string cheese stick. Lunch  Chicken breast salad.  1 cup spinach.   cup chopped tomatoes.  2 oz chicken breast, sliced.  2 tbs low-fat Italian dressing.  12 whole-wheat crackers (2 carb servings).  12 to 15 grapes (1 carb serving).  1 cup low-fat milk (1 carb serving). Snack  1 cup carrots.   cup hummus (1 carb serving). Dinner  3 oz broiled salmon.  1 cup brown rice (3 carb servings). Snack  1  cups steamed broccoli (1 carb serving) drizzled with 1 tsp olive oil and lemon juice.  1 cup light pudding (2 carb servings). DIABETES MEAL PLANNING WORKSHEET Your dietician can use this worksheet to help you decide how many servings of foods and what types of foods are right for you.  BREAKFAST Food Group and Servings / Carb Servings Grain/Starches __________________________________ Dairy __________________________________________ Vegetable ______________________________________ Fruit ___________________________________________ Meat __________________________________________ Fat ____________________________________________ LUNCH Food Group and Servings / Carb Servings Grain/Starches ___________________________________ Dairy ___________________________________________ Fruit ____________________________________________ Meat ___________________________________________ Fat _____________________________________________ DINNER Food Group and Servings / Carb Servings Grain/Starches ___________________________________ Dairy  ___________________________________________ Fruit ____________________________________________ Meat ___________________________________________ Fat _____________________________________________ SNACKS Food Group and Servings / Carb Servings Grain/Starches ___________________________________ Dairy ___________________________________________ Vegetable _______________________________________ Fruit ____________________________________________ Meat ___________________________________________ Fat _____________________________________________ DAILY TOTALS Starches _________________________ Vegetable ________________________ Fruit ____________________________ Dairy ____________________________ Meat ____________________________ Fat ______________________________ Document Released: 07/02/2005 Document Revised: 12/28/2011 Document Reviewed: 05/13/2009 Exit[employer] Patient Information 2013 [employer].   Smoking Cessation Quitting smoking is important to your health and has many advantages. However, it is not always easy to quit since nicotine is a very addictive drug. Often times, people try 3 times or more before being able to quit. This document explains the best ways for you to prepare to quit smoking. Quitting takes hard work and a lot of effort, but you can do it. ADVANTAGES OF QUITTING SMOKING  You will live longer, feel better, and live better.  Your body will feel the impact of quitting smoking almost immediately.  Within 20 minutes, blood pressure decreases. Your pulse returns to its normal level.  After 8 hours, carbon monoxide levels in the blood return to normal. Your oxygen level increases.  After 24 hours, the chance of having a heart attack starts to decrease. Your breath, hair, and body stop smelling like smoke.  After 48 hours, damaged nerve endings begin to recover. Your sense of taste and smell improve.  After 72 hours, the body is virtually free of nicotine. Your  bronchial tubes relax and breathing becomes easier.  After 2 to 12 weeks,   lungs can hold more air. Exercise becomes easier and circulation improves.  The risk of having a heart attack, stroke, cancer, or lung disease is greatly reduced.  After 1 year, the risk of coronary heart disease is cut in half.  After 5 years, the risk of stroke falls to the same as a nonsmoker.  After 10 years, the risk of lung cancer is cut in half and the risk of other cancers decreases significantly.  After 15 years, the risk of coronary heart disease drops, usually to the level of a nonsmoker.  If you are pregnant, quitting smoking will improve your chances of having a healthy baby.  The people you live with, especially any children, will be healthier.  You will have extra money to spend on things other than cigarettes. QUESTIONS TO THINK ABOUT BEFORE ATTEMPTING TO QUIT You may want to talk about your answers with your caregiver.  Why do you want to quit?  If you tried to quit in the past, what helped and what did not?  What will be the most difficult situations for you after you quit? How will you plan to handle them?  Who can help you through the tough times? Your family? Friends? A caregiver?  What pleasures do you get from smoking? What ways can you still get pleasure if you quit? Here are some questions to ask your caregiver:  How can you help me to be successful at quitting?  What medicine do you think would be best for me and how should I take it?  What should I do if I need more help?  What is smoking withdrawal like? How can I get information on withdrawal? GET READY  Set a quit date.  Change your environment by getting rid of all cigarettes, ashtrays, matches, and lighters in your home, car, or work. Do not let people smoke in your home.  Review your past attempts to quit. Think about what worked and what did not. GET SUPPORT AND ENCOURAGEMENT You have a better chance of being  successful if you have help. You can get support in many ways.  Tell your family, friends, and co-workers that you are going to quit and need their support. Ask them not to smoke around you.  Get individual, group, or telephone counseling and support. Programs are available at local hospitals and health centers. Call your local health department for information about programs in your area.  Spiritual beliefs and practices may help some smokers quit.  Download a "quit meter" on your computer to keep track of quit statistics, such as how long you have gone without smoking, cigarettes not smoked, and money saved.  Get a self-help book about quitting smoking and staying off of tobacco. LEARN NEW SKILLS AND BEHAVIORS  Distract yourself from urges to smoke. Talk to someone, go for a walk, or occupy your time with a task.  Change your normal routine. Take a different route to work. Drink tea instead of coffee. Eat breakfast in a different place.  Reduce your stress. Take a hot bath, exercise, or read a book.  Plan something enjoyable to do every day. Reward yourself for not smoking.  Explore interactive web-based programs that specialize in helping you quit. GET MEDICINE AND USE IT CORRECTLY Medicines can help you stop smoking and decrease the urge to smoke. Combining medicine with the above behavioral methods and support can greatly increase your chances of successfully quitting smoking.  Nicotine replacement therapy helps deliver nicotine to your body without   the negative effects and risks of smoking. Nicotine replacement therapy includes nicotine gum, lozenges, inhalers, nasal sprays, and skin patches. Some may be available over-the-counter and others require a prescription.  Antidepressant medicine helps people abstain from smoking, but how this works is unknown. This medicine is available by prescription.  Nicotinic receptor partial agonist medicine simulates the effect of nicotine in your  brain. This medicine is available by prescription. Ask your caregiver for advice about which medicines to use and how to use them based on your health history. Your caregiver will tell you what side effects to look out for if you choose to be on a medicine or therapy. Carefully read the information on the package. Do not use any other product containing nicotine while using a nicotine replacement product.  RELAPSE OR DIFFICULT SITUATIONS Most relapses occur within the first 3 months after quitting. Do not be discouraged if you start smoking again. Remember, most people try several times before finally quitting. You may have symptoms of withdrawal because your body is used to nicotine. You may crave cigarettes, be irritable, feel very hungry, cough often, get headaches, or have difficulty concentrating. The withdrawal symptoms are only temporary. They are strongest when you first quit, but they will go away within 10 14 days. To reduce the chances of relapse, try to:  Avoid drinking alcohol. Drinking lowers your chances of successfully quitting.  Reduce the amount of caffeine you consume. Once you quit smoking, the amount of caffeine in your body increases and can give you symptoms, such as a rapid heartbeat, sweating, and anxiety.  Avoid smokers because they can make you want to smoke.  Do not let weight gain distract you. Many smokers will gain weight when they quit, usually less than 10 pounds. Eat a healthy diet and stay active. You can always lose the weight gained after you quit.  Find ways to improve your mood other than smoking. FOR MORE INFORMATION  www.smokefree.gov  Document Released: 09/29/2001 Document Revised: 04/05/2012 Document Reviewed: 01/14/2012 [employer] Patient Information 2013 [employer].  

## 2012-08-23 NOTE — Progress Notes (Signed)
Subjective:    Patient ID: [name], male    DOB: June 29, 1961, 51 y.o.   MRN: [id number]  HPI 51 year old white male, one half pack per day smoker, patient is in today for recheck of type 2 diabetes. He's been off of his medication 2-1/2 weeks. Typical tolerates metformin thousand milligrams twice a day well. He recently went back to work after being out for 5 months for an arthroscopic shoulder surgery. He has good range of motion but continues to have pain at times. Denies any urinary frequency, urgency, blurred vision or double vision.   Review of Systems  Constitutional: Negative.   HENT: Negative.   Cardiovascular: Negative.   Gastrointestinal: Negative.   Musculoskeletal: Positive for arthralgias. Negative for joint swelling.       Right shoulder pain  Skin: Negative.   Neurological: Negative.   Hematological: Negative.   Psychiatric/Behavioral: Negative.    Past Medical History  Diagnosis Date  . Headache   . Rotator cuff tear RIGHT  . Lactose intolerance   . Abrasion L FOREARM  . Hematuria, microscopic "SINCE I WAS A KID"    History   Social History  . Marital Status: Married    Spouse Name: N/A    Number of Children: N/A  . Years of Education: N/A   Occupational History  . Not on file.   Social History Main Topics  . Smoking status: Current Every Day Smoker -- 1.5 packs/day  . Smokeless tobacco: Not on file  . Alcohol Use: No  . Drug Use: No  . Sexually Active:    Other Topics Concern  . Not on file   Social History Narrative  . No narrative on file    Past Surgical History  Procedure Date  . Hernia repair X2  . Rotator cuff repair LEFT    Family History  Problem Relation Age of Onset  . Hypertension Mother   . Hypertension Father   . Sudden death Father   . Diabetes Father     No Known Allergies  Current Outpatient Prescriptions on File Prior to Visit  Medication Sig Dispense Refill  . glucose blood (BAYER CONTOUR NEXT TEST) test  strip Check blood sugar bid. Once fasting and once 2 hours after a meal  100 each  12  . ibuprofen (ADVIL,MOTRIN) 200 MG tablet Take 400 mg by mouth every 6 (six) hours as needed. Pain      . [DISCONTINUED] metFORMIN (GLUCOPHAGE) 1000 MG tablet Take 1 tablet (1,000 mg total) by mouth 2 (two) times daily with a meal.  60 tablet  0    BP 140/84  Pulse 109  Temp 98.4 F (36.9 C) (Oral)  Wt 166 lb (75.297 kg)  SpO2 98%chart    Objective:   Physical Exam  Constitutional: He is oriented to person, place, and time. He appears well-developed and well-nourished.  HENT:  Right Ear: External ear normal.  Left Ear: External ear normal.  Nose: Nose normal.  Mouth/Throat: Oropharynx is clear and moist.  Neck: Normal range of motion. Neck supple.  Cardiovascular: Normal rate, regular rhythm and normal heart sounds.   Pulmonary/Chest: Effort normal and breath sounds normal.  Abdominal: Soft. Bowel sounds are normal.  Musculoskeletal: Normal range of motion.       Good range of motion of the right shoulder. Tenderness to palpation of the posterior aspect of the shoulder. No swelling. No redness. Well healed.  Neurological: He is alert and oriented to person, place, and time. He has  normal reflexes. He displays normal reflexes. No cranial nerve deficit. He exhibits normal muscle tone. Coordination normal.  Skin: Skin is warm and dry.  Psychiatric: He has a normal mood and affect.           Assessment & Plan:  Assessment: Type 2 diabetes-uncontrolled, Right shoulder pain  Plan: Metformin thousand milligrams one tablet twice a day. Celebrex 200 mg once daily to help with right shoulder pain and complaints of plantar fasciitis. Lab sent to include BMP, lipids, CBC, LFTs, A1c. Will notify patient pending results. Strongly encouraged smoking cessation. Patient call the office with any questions or concerns. Will recheck in 3 months and sooner when necessary. Consider additional physical therapy for  shoulder.

## 2012-08-24 ENCOUNTER — Other Ambulatory Visit: Payer: Self-pay | Admitting: Family

## 2012-08-24 LAB — CBC WITH DIFFERENTIAL/PLATELET
Basophils Absolute: 0.1 10*3/uL (ref 0.0–0.1)
HCT: 48.5 % (ref 39.0–52.0)
Hemoglobin: 16.1 g/dL (ref 13.0–17.0)
Lymphs Abs: 2.9 10*3/uL (ref 0.7–4.0)
MCV: 84.9 fl (ref 78.0–100.0)
Monocytes Absolute: 0.7 10*3/uL (ref 0.1–1.0)
Monocytes Relative: 7.6 % (ref 3.0–12.0)
Neutro Abs: 5.3 10*3/uL (ref 1.4–7.7)
Platelets: 124 10*3/uL — ABNORMAL LOW (ref 150.0–400.0)
RDW: 13 % (ref 11.5–14.6)

## 2012-08-24 LAB — HEPATIC FUNCTION PANEL
AST: 32 U/L (ref 0–37)
Albumin: 4.1 g/dL (ref 3.5–5.2)
Alkaline Phosphatase: 114 U/L (ref 39–117)
Bilirubin, Direct: 0.1 mg/dL (ref 0.0–0.3)
Total Bilirubin: 0.4 mg/dL (ref 0.3–1.2)

## 2012-08-24 LAB — LIPID PANEL
Total CHOL/HDL Ratio: 7
VLDL: 38 mg/dL (ref 0.0–40.0)

## 2012-08-24 LAB — BASIC METABOLIC PANEL
Calcium: 8.8 mg/dL (ref 8.4–10.5)
GFR: 111.44 mL/min (ref >60.00)
Glucose, Bld: 266 mg/dL — ABNORMAL HIGH (ref 70–99)
Potassium: 3.5 mEq/L (ref 3.5–5.1)
Sodium: 139 mEq/L (ref 135–145)

## 2012-08-24 LAB — HEMOGLOBIN A1C: Hgb A1c MFr Bld: 11.2 % — ABNORMAL HIGH (ref 4.6–6.5)

## 2012-08-24 MED ORDER — SIMVASTATIN 20 MG PO TABS: 20.0000 mg | ORAL_TABLET | Freq: Every day | ORAL | Status: DC

## 2012-09-02 ENCOUNTER — Encounter: Payer: Self-pay | Admitting: Family

## 2012-09-02 ENCOUNTER — Ambulatory Visit (INDEPENDENT_AMBULATORY_CARE_PROVIDER_SITE_OTHER): Payer: Managed Care, Other (non HMO) | Admitting: Family

## 2012-09-02 VITALS — BP 162/102 | HR 112 | Temp 98.7°F | Wt 166.0 lb

## 2012-09-02 DIAGNOSIS — I10 Essential (primary) hypertension: Secondary | ICD-10-CM

## 2012-09-02 DIAGNOSIS — J069 Acute upper respiratory infection, unspecified: Secondary | ICD-10-CM

## 2012-09-02 DIAGNOSIS — E1165 Type 2 diabetes mellitus with hyperglycemia: Secondary | ICD-10-CM

## 2012-09-02 MED ORDER — LISINOPRIL 20 MG PO TABS: 10.0000 mg | ORAL_TABLET | Freq: Every day | ORAL | Status: DC

## 2012-09-02 MED ORDER — AZITHROMYCIN 250 MG PO TABS: ORAL_TABLET | ORAL | Status: DC

## 2012-09-02 MED ORDER — GLIPIZIDE 10 MG PO TABS: 10.0000 mg | ORAL_TABLET | Freq: Two times a day (BID) | ORAL | Status: DC

## 2012-09-02 NOTE — Patient Instructions (Signed)

## 2012-09-05 ENCOUNTER — Encounter: Payer: Self-pay | Admitting: Family

## 2012-09-05 NOTE — Progress Notes (Signed)
Subjective:    Patient ID: [name], male    DOB: 06/13/1961, 51 y.o.   MRN: [id number]  HPI 51 year old white male, smoker, is in today with complaints of cough, congestion, sore throat x2 days. His wife is also been heel with similar symptoms. Cough is productive yellow phlegm. Has not taken any medication over-the-counter. Patient continues to have blood sugars in the high 200s to 300s. Last A1c was 11.3. Takes metformin 1000 mg twice daily and tolerating it well. She's also taken Zocor for his cholesterol and tolerating it well. Denies any urinary frequency or urgency, no blurred vision or double vision.    Review of Systems  Constitutional: Negative.   HENT: Positive for sore throat, sneezing and postnasal drip.   Eyes: Negative.   Respiratory: Positive for cough.   Cardiovascular: Negative.   Gastrointestinal: Negative.   Musculoskeletal: Negative.   Skin: Negative.   Neurological: Negative.   Hematological: Negative.   Psychiatric/Behavioral: Negative.    Past Medical History  Diagnosis Date  . Headache   . Rotator cuff tear RIGHT  . Lactose intolerance   . Abrasion L FOREARM  . Hematuria, microscopic "SINCE I WAS A KID"    History   Social History  . Marital Status: Married    Spouse Name: N/A    Number of Children: N/A  . Years of Education: N/A   Occupational History  . Not on file.   Social History Main Topics  . Smoking status: Current Every Day Smoker -- 1.5 packs/day  . Smokeless tobacco: Not on file  . Alcohol Use: No  . Drug Use: No  . Sexually Active:    Other Topics Concern  . Not on file   Social History Narrative  . No narrative on file    Past Surgical History  Procedure Date  . Hernia repair X2  . Rotator cuff repair LEFT    Family History  Problem Relation Age of Onset  . Hypertension Mother   . Hypertension Father   . Sudden death Father   . Diabetes Father     No Known Allergies  Current Outpatient Prescriptions on  File Prior to Visit  Medication Sig Dispense Refill  . glucose blood (BAYER CONTOUR NEXT TEST) test strip Check blood sugar bid. Once fasting and once 2 hours after a meal  100 each  12  . ibuprofen (ADVIL,MOTRIN) 200 MG tablet Take 400 mg by mouth every 6 (six) hours as needed. Pain      . metFORMIN (GLUCOPHAGE) 1000 MG tablet Take 1 tablet (1,000 mg total) by mouth 2 (two) times daily with a meal.  60 tablet  4  . simvastatin (ZOCOR) 20 MG tablet Take 1 tablet (20 mg total) by mouth at bedtime.  30 tablet  3  . glipiZIDE (GLUCOTROL) 10 MG tablet Take 1 tablet (10 mg total) by mouth 2 (two) times daily before a meal.  60 tablet  3  . lisinopril (PRINIVIL,ZESTRIL) 20 MG tablet Take 0.5 tablets (10 mg total) by mouth daily.  30 tablet  3    BP 162/102  Pulse 112  Temp 98.7 F (37.1 C) (Oral)  Wt 166 lb (75.297 kg)  SpO2 98%chart    Objective:   Physical Exam  Constitutional: He is oriented to person, place, and time. He appears well-developed and well-nourished.  HENT:  Right Ear: External ear normal.  Left Ear: External ear normal.  Nose: Nose normal.  Mouth/Throat: Oropharynx is clear and moist.  Neck:  Normal range of motion. Neck supple.  Cardiovascular: Normal rate, regular rhythm and normal heart sounds.   Pulmonary/Chest: Effort normal and breath sounds normal.  Abdominal: Soft. Bowel sounds are normal.  Neurological: He is alert and oriented to person, place, and time.  Skin: Skin is warm and dry.  Psychiatric: He has a normal mood and affect.          Assessment & Plan:  Assessment: Upper respiratory infection, type 2 diabetes, hyperlipidemia  Plan: Start lisinopril to decrease blood pressure protect the kidneys. Start glipizide 10 mg twice daily for better glycemic control. Over the counter symptomatic treatment for relief. Will recheck patient as scheduled and sooner when necessary.

## 2013-01-05 ENCOUNTER — Other Ambulatory Visit: Payer: Self-pay

## 2013-01-05 MED ORDER — SIMVASTATIN 20 MG PO TABS: 20.0000 mg | ORAL_TABLET | Freq: Every day | ORAL | Status: DC

## 2013-01-05 MED ORDER — GLIPIZIDE 10 MG PO TABS: 10.0000 mg | ORAL_TABLET | Freq: Two times a day (BID) | ORAL | Status: DC

## 2013-01-05 NOTE — Telephone Encounter (Signed)
Pt needs OV 

## 2013-02-28 ENCOUNTER — Other Ambulatory Visit: Payer: Self-pay

## 2013-02-28 MED ORDER — METFORMIN HCL 1000 MG PO TABS: 1000.0000 mg | ORAL_TABLET | Freq: Two times a day (BID) | ORAL | Status: DC

## 2013-04-24 ENCOUNTER — Telehealth: Payer: Self-pay | Admitting: Family

## 2013-04-24 MED ORDER — LISINOPRIL 20 MG PO TABS: 10.0000 mg | ORAL_TABLET | Freq: Every day | ORAL | Status: DC

## 2013-04-24 MED ORDER — GLIPIZIDE 10 MG PO TABS: 10.0000 mg | ORAL_TABLET | Freq: Two times a day (BID) | ORAL | Status: DC

## 2013-04-24 MED ORDER — SIMVASTATIN 20 MG PO TABS: 20.0000 mg | ORAL_TABLET | Freq: Every day | ORAL | Status: DC

## 2013-04-24 MED ORDER — METFORMIN HCL 1000 MG PO TABS: 1000.0000 mg | ORAL_TABLET | Freq: Two times a day (BID) | ORAL | Status: DC

## 2013-04-24 NOTE — Telephone Encounter (Signed)
Pt needs refill of  glipiZIDE (GLUCOTROL) 10 MG tablet  1 tab po bid metFORMIN (GLUCOPHAGE) 1000 MG tablet 1 po bid lisinopril (PRINIVIL,ZESTRIL) 20 MG tablet ( 05 pill/day) simvastatin (ZOCOR) 20 MG tablet  1 po qid  All rx 90 day supply Pharm: [facility]/ [address]  Pt aware of appt needs for refill, appt made for 7/23 1pm

## 2013-05-10 ENCOUNTER — Ambulatory Visit: Payer: Managed Care, Other (non HMO) | Admitting: Family

## 2013-05-23 ENCOUNTER — Encounter: Payer: Self-pay | Admitting: Family

## 2013-05-23 ENCOUNTER — Ambulatory Visit (INDEPENDENT_AMBULATORY_CARE_PROVIDER_SITE_OTHER): Payer: Managed Care, Other (non HMO) | Admitting: Family

## 2013-05-23 VITALS — BP 112/80 | HR 94 | Wt 177.0 lb

## 2013-05-23 DIAGNOSIS — E1165 Type 2 diabetes mellitus with hyperglycemia: Secondary | ICD-10-CM

## 2013-05-23 DIAGNOSIS — F17200 Nicotine dependence, unspecified, uncomplicated: Secondary | ICD-10-CM

## 2013-05-23 DIAGNOSIS — I10 Essential (primary) hypertension: Secondary | ICD-10-CM

## 2013-05-23 DIAGNOSIS — Z720 Tobacco use: Secondary | ICD-10-CM

## 2013-05-23 DIAGNOSIS — E1169 Type 2 diabetes mellitus with other specified complication: Secondary | ICD-10-CM | POA: Insufficient documentation

## 2013-05-23 DIAGNOSIS — E7800 Pure hypercholesterolemia, unspecified: Secondary | ICD-10-CM

## 2013-05-23 LAB — BASIC METABOLIC PANEL
BUN: 14 mg/dL (ref 6–23)
CO2: 26 mEq/L (ref 19–32)
Chloride: 107 mEq/L (ref 96–112)
Glucose, Bld: 234 mg/dL — ABNORMAL HIGH (ref 70–99)
Potassium: 3.9 mEq/L (ref 3.5–5.1)
Sodium: 140 mEq/L (ref 135–145)

## 2013-05-23 LAB — HEPATIC FUNCTION PANEL
ALT: 86 U/L — ABNORMAL HIGH (ref 0–53)
AST: 47 U/L — ABNORMAL HIGH (ref 0–37)
Albumin: 4.2 g/dL (ref 3.5–5.2)
Alkaline Phosphatase: 99 U/L (ref 39–117)

## 2013-05-23 LAB — HEMOGLOBIN A1C: Hgb A1c MFr Bld: 10.3 % — ABNORMAL HIGH (ref 4.6–6.5)

## 2013-05-23 LAB — LIPID PANEL: Cholesterol: 185 mg/dL (ref 0–200)

## 2013-05-23 MED ORDER — BUPROPION HCL ER (XL) 150 MG PO TB24: 150.0000 mg | ORAL_TABLET | Freq: Every day | ORAL | Status: DC

## 2013-05-23 NOTE — Progress Notes (Signed)
Subjective:    Patient ID: [name], male    DOB: [birth year], 52 y.o.   MRN: [id number]  HPI 52 year old, WM, 1 ppd smoker is in today recheck a type 2 diabetes-uncontrolled, hypertension, hyperlipidemia, and tobacco abuse. Patient reports having fluctuating blood sugars that dropped down to 60 at times. Patient able to that when his blood sugar is low and usually eat something. Reports a desire to stop smoking. He has heard about Chantix doesn't want to try it due to nightmares. He requests a try will be returning. Denies any past history of anxiety or depression. Does not routinely exercise.   Review of Systems  Constitutional: Negative.   HENT: Negative.   Eyes: Negative.   Respiratory: Negative.   Cardiovascular: Negative.   Gastrointestinal: Negative.   Endocrine: Negative.   Musculoskeletal: Negative.   Skin: Negative.   Neurological: Negative.   Hematological: Negative.   Psychiatric/Behavioral: Negative.    Past Medical History  Diagnosis Date  . Headache(784.0)   . Rotator cuff tear RIGHT  . Lactose intolerance   . Abrasion L FOREARM  . Hematuria, microscopic "SINCE I WAS A KID"    History   Social History  . Marital Status: Married    Spouse Name: N/A    Number of Children: N/A  . Years of Education: N/A   Occupational History  . Not on file.   Social History Main Topics  . Smoking status: Current Every Day Smoker -- 1.50 packs/day  . Smokeless tobacco: Not on file  . Alcohol Use: No  . Drug Use: No  . Sexually Active:    Other Topics Concern  . Not on file   Social History Narrative  . No narrative on file    Past Surgical History  Procedure Laterality Date  . Hernia repair  X2  . Rotator cuff repair  LEFT    Family History  Problem Relation Age of Onset  . Hypertension Mother   . Hypertension Father   . Sudden death Father   . Diabetes Father     No Known Allergies  Current Outpatient Prescriptions on File Prior to Visit   Medication Sig Dispense Refill  . glipiZIDE (GLUCOTROL) 10 MG tablet Take 1 tablet (10 mg total) by mouth 2 (two) times daily before a meal.  60 tablet  0  . ibuprofen (ADVIL,MOTRIN) 200 MG tablet Take 400 mg by mouth every 6 (six) hours as needed. Pain      . lisinopril (PRINIVIL,ZESTRIL) 20 MG tablet Take 0.5 tablets (10 mg total) by mouth daily.  15 tablet  0  . metFORMIN (GLUCOPHAGE) 1000 MG tablet Take 1 tablet (1,000 mg total) by mouth 2 (two) times daily with a meal.  60 tablet  0  . simvastatin (ZOCOR) 20 MG tablet Take 1 tablet (20 mg total) by mouth at bedtime.  30 tablet  0  . azithromycin (ZITHROMAX) 250 MG tablet 2 tabs po x 1. Then 1 tab a day x 4 more days.  6 tablet  0   No current facility-administered medications on file prior to visit.    BP 112/80  Pulse 94  Wt 177 lb (80.287 kg)  BMI 29.24 kg/m2chart     Objective:   Physical Exam  Constitutional: He is oriented to person, place, and time. He appears well-developed and well-nourished.  HENT:  Right Ear: External ear normal.  Left Ear: External ear normal.  Nose: Nose normal.  Mouth/Throat: Oropharynx is clear and moist.  Eyes: Conjunctivae are normal. Pupils are equal, round, and reactive to light.  Neck: Normal range of motion. Neck supple.  Cardiovascular: Normal rate, regular rhythm and normal heart sounds.   Pulmonary/Chest: Effort normal and breath sounds normal.  Abdominal: Soft. Bowel sounds are normal.  Musculoskeletal: Normal range of motion.  Neurological: He is alert and oriented to person, place, and time.  Skin: Skin is warm.  Psychiatric: He has a normal mood and affect.          Assessment & Plan:  Assessment:  1. Type 2 Diabetes-uncontrolled 2. Hypertension 3. Hypercholesterolemia 4. Tobacco Abuse  Plan: Lab sent to include A1c, BMP, LFTs, lipid notify patient of results. Continue current medications at present. Start Wellbutrin XL 150 mg once daily. [name] office with any questions  or concerns. Recheck in 6 months, as scheduled, and as needed.

## 2013-05-23 NOTE — Patient Instructions (Addendum)
Smoking Cessation Quitting smoking is important to your health and has many advantages. However, it is not always easy to quit since nicotine is a very addictive drug. Often times, people try 3 times or more before being able to quit. This document explains the best ways for you to prepare to quit smoking. Quitting takes hard work and a lot of effort, but you can do it. ADVANTAGES OF QUITTING SMOKING  You will live longer, feel better, and live better.  Your body will feel the impact of quitting smoking almost immediately.  Within 20 minutes, blood pressure decreases. Your pulse returns to its normal level.  After 8 hours, carbon monoxide levels in the blood return to normal. Your oxygen level increases.  After 24 hours, the chance of having a heart attack starts to decrease. Your breath, hair, and body stop smelling like smoke.  After 48 hours, damaged nerve endings begin to recover. Your sense of taste and smell improve.  After 72 hours, the body is virtually free of nicotine. Your bronchial tubes relax and breathing becomes easier.  After 2 to 12 weeks, lungs can hold more air. Exercise becomes easier and circulation improves.  The risk of having a heart attack, stroke, cancer, or lung disease is greatly reduced.  After 1 year, the risk of coronary heart disease is cut in half.  After 5 years, the risk of stroke falls to the same as a nonsmoker.  After 10 years, the risk of lung cancer is cut in half and the risk of other cancers decreases significantly.  After 15 years, the risk of coronary heart disease drops, usually to the level of a nonsmoker.  If you are pregnant, quitting smoking will improve your chances of having a healthy baby.  The people you live with, especially any children, will be healthier.  You will have extra money to spend on things other than cigarettes. QUESTIONS TO THINK ABOUT BEFORE ATTEMPTING TO QUIT You may want to talk about your answers with your  caregiver.  Why do you want to quit?  If you tried to quit in the past, what helped and what did not?  What will be the most difficult situations for you after you quit? How will you plan to handle them?  Who can help you through the tough times? Your family? Friends? A caregiver?  What pleasures do you get from smoking? What ways can you still get pleasure if you quit? Here are some questions to ask your caregiver:  How can you help me to be successful at quitting?  What medicine do you think would be best for me and how should I take it?  What should I do if I need more help?  What is smoking withdrawal like? How can I get information on withdrawal? GET READY  Set a quit date.  Change your environment by getting rid of all cigarettes, ashtrays, matches, and lighters in your home, car, or work. Do not let people smoke in your home.  Review your past attempts to quit. Think about what worked and what did not. GET SUPPORT AND ENCOURAGEMENT You have a better chance of being successful if you have help. You can get support in many ways.  Tell your family, friends, and co-workers that you are going to quit and need their support. Ask them not to smoke around you.  Get individual, group, or telephone counseling and support. Programs are available at local hospitals and health centers. Call your local health department for   information about programs in your area.  Spiritual beliefs and practices may help some smokers quit.  Download a "quit meter" on your computer to keep track of quit statistics, such as how long you have gone without smoking, cigarettes not smoked, and money saved.  Get a self-help book about quitting smoking and staying off of tobacco. LEARN NEW SKILLS AND BEHAVIORS  Distract yourself from urges to smoke. Talk to someone, go for a walk, or occupy your time with a task.  Change your normal routine. Take a different route to work. Drink tea instead of coffee.  Eat breakfast in a different place.  Reduce your stress. Take a hot bath, exercise, or read a book.  Plan something enjoyable to do every day. Reward yourself for not smoking.  Explore interactive web-based programs that specialize in helping you quit. GET MEDICINE AND USE IT CORRECTLY Medicines can help you stop smoking and decrease the urge to smoke. Combining medicine with the above behavioral methods and support can greatly increase your chances of successfully quitting smoking.  Nicotine replacement therapy helps deliver nicotine to your body without the negative effects and risks of smoking. Nicotine replacement therapy includes nicotine gum, lozenges, inhalers, nasal sprays, and skin patches. Some may be available over-the-counter and others require a prescription.  Antidepressant medicine helps people abstain from smoking, but how this works is unknown. This medicine is available by prescription.  Nicotinic receptor partial agonist medicine simulates the effect of nicotine in your brain. This medicine is available by prescription. Ask your caregiver for advice about which medicines to use and how to use them based on your health history. Your caregiver will tell you what side effects to look out for if you choose to be on a medicine or therapy. Carefully read the information on the package. Do not use any other product containing nicotine while using a nicotine replacement product.  RELAPSE OR DIFFICULT SITUATIONS Most relapses occur within the first 3 months after quitting. Do not be discouraged if you start smoking again. Remember, most people try several times before finally quitting. You may have symptoms of withdrawal because your body is used to nicotine. You may crave cigarettes, be irritable, feel very hungry, cough often, get headaches, or have difficulty concentrating. The withdrawal symptoms are only temporary. They are strongest when you first quit, but they will go away within  10 14 days. To reduce the chances of relapse, try to:  Avoid drinking alcohol. Drinking lowers your chances of successfully quitting.  Reduce the amount of caffeine you consume. Once you quit smoking, the amount of caffeine in your body increases and can give you symptoms, such as a rapid heartbeat, sweating, and anxiety.  Avoid smokers because they can make you want to smoke.  Do not let weight gain distract you. Many smokers will gain weight when they quit, usually less than 10 pounds. Eat a healthy diet and stay active. You can always lose the weight gained after you quit.  Find ways to improve your mood other than smoking. FOR MORE INFORMATION  www.smokefree.gov  Document Released: 09/29/2001 Document Revised: 04/05/2012 Document Reviewed: 01/14/2012 Exit[employer] Patient Information 2014 [employer].  

## 2013-05-24 ENCOUNTER — Other Ambulatory Visit: Payer: Self-pay | Admitting: Family

## 2013-05-24 DIAGNOSIS — E1165 Type 2 diabetes mellitus with hyperglycemia: Secondary | ICD-10-CM

## 2013-05-31 ENCOUNTER — Other Ambulatory Visit: Payer: Self-pay

## 2013-05-31 MED ORDER — METFORMIN HCL 1000 MG PO TABS: 1000.0000 mg | ORAL_TABLET | Freq: Two times a day (BID) | ORAL | Status: DC

## 2013-05-31 MED ORDER — LISINOPRIL 20 MG PO TABS: 10.0000 mg | ORAL_TABLET | Freq: Every day | ORAL | Status: DC

## 2013-05-31 MED ORDER — SIMVASTATIN 20 MG PO TABS: 20.0000 mg | ORAL_TABLET | Freq: Every day | ORAL | Status: DC

## 2013-05-31 MED ORDER — GLIPIZIDE 10 MG PO TABS: 10.0000 mg | ORAL_TABLET | Freq: Two times a day (BID) | ORAL | Status: DC

## 2013-06-09 ENCOUNTER — Ambulatory Visit: Payer: Managed Care, Other (non HMO) | Admitting: Endocrinology

## 2013-07-03 ENCOUNTER — Ambulatory Visit: Payer: Managed Care, Other (non HMO) | Admitting: Family

## 2013-08-29 ENCOUNTER — Other Ambulatory Visit: Payer: Self-pay

## 2013-08-29 MED ORDER — GLIPIZIDE 10 MG PO TABS: 10.0000 mg | ORAL_TABLET | Freq: Two times a day (BID) | ORAL | Status: DC

## 2013-08-29 MED ORDER — SIMVASTATIN 20 MG PO TABS: 20.0000 mg | ORAL_TABLET | Freq: Every day | ORAL | Status: DC

## 2013-08-29 MED ORDER — METFORMIN HCL 1000 MG PO TABS: 1000.0000 mg | ORAL_TABLET | Freq: Two times a day (BID) | ORAL | Status: DC

## 2013-08-29 MED ORDER — LISINOPRIL 20 MG PO TABS: 10.0000 mg | ORAL_TABLET | Freq: Every day | ORAL | Status: DC

## 2013-12-10 ENCOUNTER — Other Ambulatory Visit: Payer: Self-pay | Admitting: Family

## 2014-05-30 ENCOUNTER — Telehealth: Payer: Self-pay | Admitting: *Deleted

## 2014-05-30 NOTE — Telephone Encounter (Signed)
Pt needs an office visit with Roxy Cedar for the diabetic bundle.  Unable to leave message, no answer

## 2014-06-04 NOTE — Telephone Encounter (Signed)
Unable to leave message

## 2014-06-08 ENCOUNTER — Encounter: Payer: Self-pay | Admitting: *Deleted

## 2014-06-08 NOTE — Telephone Encounter (Signed)
Still unable to leave message, mailed letter to pt

## 2015-10-20 DIAGNOSIS — I214 Non-ST elevation (NSTEMI) myocardial infarction: Secondary | ICD-10-CM

## 2015-10-20 HISTORY — DX: Non-ST elevation (NSTEMI) myocardial infarction: I21.4

## 2016-06-10 ENCOUNTER — Other Ambulatory Visit: Payer: Self-pay | Admitting: General Surgery

## 2016-06-10 ENCOUNTER — Encounter (HOSPITAL_COMMUNITY)
Admission: AD | Disposition: A | Discharge status: Home / Self Care | Payer: Self-pay | Source: Ambulatory Visit | Attending: Internal Medicine

## 2016-06-10 ENCOUNTER — Observation Stay (HOSPITAL_COMMUNITY): Payer: BLUE CROSS/BLUE SHIELD | Admitting: Anesthesiology

## 2016-06-10 ENCOUNTER — Inpatient Hospital Stay (HOSPITAL_COMMUNITY)
Admission: AD | Admit: 2016-06-10 | Discharge: 2016-06-19 | DRG: 280 | Disposition: A | Discharge status: Home / Self Care | Payer: BLUE CROSS/BLUE SHIELD | Source: Ambulatory Visit | Attending: Internal Medicine | Admitting: Internal Medicine

## 2016-06-10 DIAGNOSIS — I509 Heart failure, unspecified: Secondary | ICD-10-CM

## 2016-06-10 DIAGNOSIS — I5023 Acute on chronic systolic (congestive) heart failure: Secondary | ICD-10-CM | POA: Diagnosis present

## 2016-06-10 DIAGNOSIS — Z8249 Family history of ischemic heart disease and other diseases of the circulatory system: Secondary | ICD-10-CM

## 2016-06-10 DIAGNOSIS — E7800 Pure hypercholesterolemia, unspecified: Secondary | ICD-10-CM | POA: Diagnosis present

## 2016-06-10 DIAGNOSIS — L02412 Cutaneous abscess of left axilla: Secondary | ICD-10-CM | POA: Diagnosis present

## 2016-06-10 DIAGNOSIS — R0902 Hypoxemia: Secondary | ICD-10-CM

## 2016-06-10 DIAGNOSIS — J9601 Acute respiratory failure with hypoxia: Secondary | ICD-10-CM | POA: Diagnosis present

## 2016-06-10 DIAGNOSIS — Z9119 Patient's noncompliance with other medical treatment and regimen: Secondary | ICD-10-CM

## 2016-06-10 DIAGNOSIS — I255 Ischemic cardiomyopathy: Secondary | ICD-10-CM | POA: Diagnosis present

## 2016-06-10 DIAGNOSIS — C439 Malignant melanoma of skin, unspecified: Secondary | ICD-10-CM | POA: Diagnosis present

## 2016-06-10 DIAGNOSIS — R0600 Dyspnea, unspecified: Secondary | ICD-10-CM

## 2016-06-10 DIAGNOSIS — R0602 Shortness of breath: Secondary | ICD-10-CM

## 2016-06-10 DIAGNOSIS — IMO0001 Reserved for inherently not codable concepts without codable children: Secondary | ICD-10-CM

## 2016-06-10 DIAGNOSIS — I2582 Chronic total occlusion of coronary artery: Secondary | ICD-10-CM | POA: Diagnosis present

## 2016-06-10 DIAGNOSIS — I480 Paroxysmal atrial fibrillation: Secondary | ICD-10-CM | POA: Diagnosis present

## 2016-06-10 DIAGNOSIS — I110 Hypertensive heart disease with heart failure: Secondary | ICD-10-CM | POA: Diagnosis present

## 2016-06-10 DIAGNOSIS — B9562 Methicillin resistant Staphylococcus aureus infection as the cause of diseases classified elsewhere: Secondary | ICD-10-CM | POA: Diagnosis present

## 2016-06-10 DIAGNOSIS — E739 Lactose intolerance, unspecified: Secondary | ICD-10-CM | POA: Diagnosis present

## 2016-06-10 DIAGNOSIS — Z794 Long term (current) use of insulin: Secondary | ICD-10-CM

## 2016-06-10 DIAGNOSIS — I2510 Atherosclerotic heart disease of native coronary artery without angina pectoris: Secondary | ICD-10-CM | POA: Diagnosis present

## 2016-06-10 DIAGNOSIS — F329 Major depressive disorder, single episode, unspecified: Secondary | ICD-10-CM | POA: Diagnosis present

## 2016-06-10 DIAGNOSIS — J449 Chronic obstructive pulmonary disease, unspecified: Secondary | ICD-10-CM | POA: Diagnosis present

## 2016-06-10 DIAGNOSIS — I4892 Unspecified atrial flutter: Secondary | ICD-10-CM | POA: Diagnosis present

## 2016-06-10 DIAGNOSIS — N179 Acute kidney failure, unspecified: Secondary | ICD-10-CM | POA: Diagnosis present

## 2016-06-10 DIAGNOSIS — F17210 Nicotine dependence, cigarettes, uncomplicated: Secondary | ICD-10-CM | POA: Diagnosis present

## 2016-06-10 DIAGNOSIS — J690 Pneumonitis due to inhalation of food and vomit: Secondary | ICD-10-CM | POA: Diagnosis present

## 2016-06-10 DIAGNOSIS — Z720 Tobacco use: Secondary | ICD-10-CM | POA: Diagnosis present

## 2016-06-10 DIAGNOSIS — I97191 Other postprocedural cardiac functional disturbances following other surgery: Secondary | ICD-10-CM | POA: Diagnosis present

## 2016-06-10 DIAGNOSIS — I214 Non-ST elevation (NSTEMI) myocardial infarction: Principal | ICD-10-CM | POA: Diagnosis present

## 2016-06-10 DIAGNOSIS — I10 Essential (primary) hypertension: Secondary | ICD-10-CM | POA: Diagnosis present

## 2016-06-10 DIAGNOSIS — E1169 Type 2 diabetes mellitus with other specified complication: Secondary | ICD-10-CM | POA: Diagnosis present

## 2016-06-10 DIAGNOSIS — E1165 Type 2 diabetes mellitus with hyperglycemia: Secondary | ICD-10-CM | POA: Diagnosis present

## 2016-06-10 HISTORY — PX: INCISION AND DRAINAGE ABSCESS: SHX5864

## 2016-06-10 HISTORY — DX: Essential (primary) hypertension: I10

## 2016-06-10 HISTORY — DX: Type 2 diabetes mellitus without complications: E11.9

## 2016-06-10 LAB — COMPREHENSIVE METABOLIC PANEL
ALBUMIN: 3.8 g/dL (ref 3.5–5.0)
ALK PHOS: 87 U/L (ref 38–126)
ALT: 25 U/L (ref 17–63)
AST: 19 U/L (ref 15–41)
Anion gap: 8 (ref 5–15)
BILIRUBIN TOTAL: 0.7 mg/dL (ref 0.3–1.2)
BUN: 15 mg/dL (ref 6–20)
CALCIUM: 8.6 mg/dL — AB (ref 8.9–10.3)
CO2: 28 mmol/L (ref 22–32)
CREATININE: 0.76 mg/dL (ref 0.61–1.24)
Chloride: 101 mmol/L (ref 101–111)
GFR calc Af Amer: >60 mL/min (ref >60)
GFR calc non Af Amer: >60 mL/min (ref >60)
GLUCOSE: 215 mg/dL — AB (ref 65–99)
Potassium: 3.4 mmol/L — ABNORMAL LOW (ref 3.5–5.1)
Sodium: 137 mmol/L (ref 135–145)
TOTAL PROTEIN: 7.4 g/dL (ref 6.5–8.1)

## 2016-06-10 LAB — SURGICAL PCR SCREEN
MRSA, PCR: NEGATIVE
STAPHYLOCOCCUS AUREUS: NEGATIVE

## 2016-06-10 LAB — GLUCOSE, CAPILLARY
Glucose-Capillary: 252 mg/dL — ABNORMAL HIGH (ref 65–99)
Glucose-Capillary: 182 mg/dL — ABNORMAL HIGH (ref 65–99)
Glucose-Capillary: 218 mg/dL — ABNORMAL HIGH (ref 65–99)

## 2016-06-10 LAB — CBC
HEMATOCRIT: 40.9 % (ref 39.0–52.0)
HEMOGLOBIN: 13.5 g/dL (ref 13.0–17.0)
MCH: 27.3 pg (ref 26.0–34.0)
MCHC: 33 g/dL (ref 30.0–36.0)
MCV: 82.6 fL (ref 78.0–100.0)
Platelets: 125 10*3/uL — ABNORMAL LOW (ref 150–400)
RBC: 4.95 MIL/uL (ref 4.22–5.81)
RDW: 12.8 % (ref 11.5–15.5)
WBC: 11.3 10*3/uL — AB (ref 4.0–10.5)

## 2016-06-10 LAB — PROTIME-INR
INR: 1
Prothrombin Time: 13.2 seconds (ref 11.4–15.2)

## 2016-06-10 SURGERY — INCISION AND DRAINAGE, ABSCESS
Anesthesia: General | Site: Axilla | Laterality: Left

## 2016-06-10 MED ORDER — PROPOFOL 10 MG/ML IV BOLUS
INTRAVENOUS | Status: DC | PRN
  Administered 2016-06-10: 150 mg via INTRAVENOUS
  Administered 2016-06-10: 50 mg via INTRAVENOUS

## 2016-06-10 MED ORDER — PIPERACILLIN-TAZOBACTAM 3.375 G IVPB
3.3750 g | Freq: Three times a day (TID) | INTRAVENOUS | Status: DC
  Administered 2016-06-10: 3.375 g via INTRAVENOUS
  Filled 2016-06-10: qty 50

## 2016-06-10 MED ORDER — MIDAZOLAM HCL 5 MG/5ML IJ SOLN
INTRAMUSCULAR | Status: DC | PRN
  Administered 2016-06-10: 2 mg via INTRAVENOUS

## 2016-06-10 MED ORDER — POTASSIUM CHLORIDE IN NACL 20-0.9 MEQ/L-% IV SOLN
INTRAVENOUS | Status: DC
  Administered 2016-06-10: 19:00:00 via INTRAVENOUS
  Filled 2016-06-10: qty 1000

## 2016-06-10 MED ORDER — PROPOFOL 10 MG/ML IV BOLUS
INTRAVENOUS | Status: AC
  Filled 2016-06-10: qty 20

## 2016-06-10 MED ORDER — HYDROMORPHONE HCL 1 MG/ML IJ SOLN: 0.2500 mg | INTRAMUSCULAR | Status: DC | PRN

## 2016-06-10 MED ORDER — MORPHINE SULFATE (PF) 2 MG/ML IV SOLN
1.0000 mg | INTRAVENOUS | Status: DC | PRN
  Administered 2016-06-10: 2 mg via INTRAVENOUS
  Administered 2016-06-11 (×2): 4 mg via INTRAVENOUS
  Administered 2016-06-12 – 2016-06-13 (×3): 2 mg via INTRAVENOUS
  Administered 2016-06-15 – 2016-06-16 (×2): 1 mg via INTRAVENOUS
  Administered 2016-06-16 – 2016-06-18 (×6): 2 mg via INTRAVENOUS
  Filled 2016-06-10 (×11): qty 1
  Filled 2016-06-10: qty 2
  Filled 2016-06-10: qty 1
  Filled 2016-06-10: qty 2

## 2016-06-10 MED ORDER — INSULIN ASPART 100 UNIT/ML ~~LOC~~ SOLN
0.0000 [IU] | Freq: Four times a day (QID) | SUBCUTANEOUS | Status: DC
  Administered 2016-06-10: 5 [IU] via SUBCUTANEOUS
  Administered 2016-06-11: 9 [IU] via SUBCUTANEOUS

## 2016-06-10 MED ORDER — HEPARIN SODIUM (PORCINE) 5000 UNIT/ML IJ SOLN: 5000.0000 [IU] | Freq: Three times a day (TID) | INTRAMUSCULAR | Status: DC

## 2016-06-10 MED ORDER — ACETAMINOPHEN 325 MG PO TABS
650.0000 mg | ORAL_TABLET | Freq: Four times a day (QID) | ORAL | Status: DC | PRN
  Administered 2016-06-10: 650 mg via ORAL
  Filled 2016-06-10: qty 2

## 2016-06-10 MED ORDER — LIDOCAINE HCL (CARDIAC) 20 MG/ML IV SOLN
INTRAVENOUS | Status: DC | PRN
  Administered 2016-06-10: 50 mg via INTRAVENOUS

## 2016-06-10 MED ORDER — OXYCODONE-ACETAMINOPHEN 5-325 MG PO TABS
1.0000 | ORAL_TABLET | ORAL | Status: DC | PRN
  Filled 2016-06-10: qty 2

## 2016-06-10 MED ORDER — DIPHENHYDRAMINE HCL 50 MG/ML IJ SOLN: 25.0000 mg | Freq: Four times a day (QID) | INTRAMUSCULAR | Status: DC | PRN

## 2016-06-10 MED ORDER — 0.9 % SODIUM CHLORIDE (POUR BTL) OPTIME
TOPICAL | Status: DC | PRN
  Administered 2016-06-10: 1000 mL

## 2016-06-10 MED ORDER — DEXAMETHASONE SODIUM PHOSPHATE 10 MG/ML IJ SOLN
INTRAMUSCULAR | Status: AC
  Filled 2016-06-10: qty 1

## 2016-06-10 MED ORDER — MIDAZOLAM HCL 2 MG/2ML IJ SOLN
INTRAMUSCULAR | Status: AC
  Filled 2016-06-10: qty 2

## 2016-06-10 MED ORDER — LIDOCAINE HCL (CARDIAC) 20 MG/ML IV SOLN
INTRAVENOUS | Status: AC
  Filled 2016-06-10: qty 5

## 2016-06-10 MED ORDER — ONDANSETRON 4 MG PO TBDP: 4.0000 mg | ORAL_TABLET | Freq: Four times a day (QID) | ORAL | Status: DC | PRN

## 2016-06-10 MED ORDER — BUPROPION HCL ER (XL) 150 MG PO TB24
150.0000 mg | ORAL_TABLET | Freq: Every day | ORAL | Status: DC
  Administered 2016-06-12 – 2016-06-19 (×7): 150 mg via ORAL
  Filled 2016-06-10 (×9): qty 1

## 2016-06-10 MED ORDER — NICOTINE 7 MG/24HR TD PT24
7.0000 mg | MEDICATED_PATCH | Freq: Every day | TRANSDERMAL | Status: DC
  Administered 2016-06-11 – 2016-06-19 (×8): 7 mg via TRANSDERMAL
  Filled 2016-06-10 (×11): qty 1

## 2016-06-10 MED ORDER — ONDANSETRON HCL 4 MG/2ML IJ SOLN
INTRAMUSCULAR | Status: AC
  Filled 2016-06-10: qty 2

## 2016-06-10 MED ORDER — FENTANYL CITRATE (PF) 100 MCG/2ML IJ SOLN
INTRAMUSCULAR | Status: AC
  Filled 2016-06-10: qty 2

## 2016-06-10 MED ORDER — INSULIN ASPART 100 UNIT/ML ~~LOC~~ SOLN: 0.0000 [IU] | Freq: Three times a day (TID) | SUBCUTANEOUS | Status: DC

## 2016-06-10 MED ORDER — BUPIVACAINE-EPINEPHRINE (PF) 0.5% -1:200000 IJ SOLN
INTRAMUSCULAR | Status: DC | PRN
  Administered 2016-06-10: 12 mL

## 2016-06-10 MED ORDER — BUPIVACAINE-EPINEPHRINE (PF) 0.5% -1:200000 IJ SOLN
INTRAMUSCULAR | Status: AC
  Filled 2016-06-10: qty 30

## 2016-06-10 MED ORDER — ONDANSETRON HCL 4 MG/2ML IJ SOLN
2.0000 mg | INTRAMUSCULAR | Status: DC | PRN
  Administered 2016-06-11 (×3): 4 mg via INTRAVENOUS
  Filled 2016-06-10 (×3): qty 2

## 2016-06-10 MED ORDER — OXYCODONE-ACETAMINOPHEN 5-325 MG PO TABS
1.0000 | ORAL_TABLET | ORAL | Status: DC | PRN
  Administered 2016-06-13 – 2016-06-14 (×2): 2 via ORAL
  Administered 2016-06-14: 1 via ORAL
  Administered 2016-06-15 – 2016-06-16 (×3): 2 via ORAL
  Filled 2016-06-10: qty 2
  Filled 2016-06-10: qty 1
  Filled 2016-06-10: qty 2
  Filled 2016-06-10: qty 1
  Filled 2016-06-10 (×3): qty 2

## 2016-06-10 MED ORDER — ACETAMINOPHEN 650 MG RE SUPP: 650.0000 mg | Freq: Four times a day (QID) | RECTAL | Status: DC | PRN

## 2016-06-10 MED ORDER — MORPHINE SULFATE (PF) 2 MG/ML IV SOLN
2.0000 mg | INTRAVENOUS | Status: DC | PRN
  Administered 2016-06-10: 2 mg via INTRAVENOUS
  Filled 2016-06-10: qty 1

## 2016-06-10 MED ORDER — DIPHENHYDRAMINE HCL 25 MG PO CAPS
25.0000 mg | ORAL_CAPSULE | Freq: Four times a day (QID) | ORAL | Status: DC | PRN
Start: 2016-06-10 — End: 2016-06-10

## 2016-06-10 MED ORDER — LACTATED RINGERS IV SOLN
INTRAVENOUS | Status: DC
  Administered 2016-06-10 – 2016-06-11 (×2): via INTRAVENOUS

## 2016-06-10 MED ORDER — FENTANYL CITRATE (PF) 100 MCG/2ML IJ SOLN
INTRAMUSCULAR | Status: DC | PRN
  Administered 2016-06-10 (×2): 50 ug via INTRAVENOUS

## 2016-06-10 MED ORDER — LISINOPRIL 10 MG PO TABS
10.0000 mg | ORAL_TABLET | Freq: Every day | ORAL | Status: DC
  Filled 2016-06-10: qty 1

## 2016-06-10 MED ORDER — ONDANSETRON HCL 4 MG/2ML IJ SOLN
4.0000 mg | Freq: Four times a day (QID) | INTRAMUSCULAR | Status: DC | PRN
  Administered 2016-06-10: 4 mg via INTRAVENOUS

## 2016-06-10 SURGICAL SUPPLY — 20 items
BNDG GAUZE ELAST 4 BULKY (GAUZE/BANDAGES/DRESSINGS) ×3 IMPLANT
COVER SURGICAL LIGHT HANDLE (MISCELLANEOUS) ×3 IMPLANT
DECANTER SPIKE VIAL GLASS SM (MISCELLANEOUS) IMPLANT
DRAPE LAPAROTOMY T 102X78X121 (DRAPES) ×3 IMPLANT
DRSG PAD ABDOMINAL 8X10 ST (GAUZE/BANDAGES/DRESSINGS) ×3 IMPLANT
ELECT REM PT RETURN 9FT ADLT (ELECTROSURGICAL) ×3
ELECTRODE REM PT RTRN 9FT ADLT (ELECTROSURGICAL) ×1 IMPLANT
GAUZE SPONGE 4X4 12PLY STRL (GAUZE/BANDAGES/DRESSINGS) ×3 IMPLANT
GLOVE BIO SURGEON STRL SZ7.5 (GLOVE) ×3 IMPLANT
GLOVE BIOGEL PI IND STRL 7.0 (GLOVE) ×1 IMPLANT
GLOVE BIOGEL PI INDICATOR 7.0 (GLOVE) ×2
GOWN STRL REUS W/ TWL XL LVL3 (GOWN DISPOSABLE) ×1 IMPLANT
GOWN STRL REUS W/TWL XL LVL3 (GOWN DISPOSABLE) ×2
KIT BASIN OR (CUSTOM PROCEDURE TRAY) ×3 IMPLANT
NEEDLE HYPO 25X1 1.5 SAFETY (NEEDLE) ×3 IMPLANT
NS IRRIG 1000ML POUR BTL (IV SOLUTION) ×3 IMPLANT
PACK GENERAL/GYN (CUSTOM PROCEDURE TRAY) ×3 IMPLANT
SOL PREP POV-IOD 4OZ 10% (MISCELLANEOUS) ×3 IMPLANT
SYR CONTROL 10ML LL (SYRINGE) ×3 IMPLANT
TOWEL OR 17X26 10 PK STRL BLUE (TOWEL DISPOSABLE) ×3 IMPLANT

## 2016-06-10 NOTE — Interval H&P Note (Signed)
History and Physical Interval Note:  06/10/2016 8:30 PM  [name]  has presented today for surgery, with the diagnosis of left axillary abscess  The various methods of treatment have been discussed with the patient and family. After consideration of risks, benefits and other options for treatment, the patient has consented to  Procedure(s): INCISION AND DRAINAGE LEFT AXILLARY ABSCESS (Left) as a surgical intervention .  The patient's history has been reviewed, patient examined, no change in status, stable for surgery.  I have reviewed the patient's chart and labs.  Questions were answered to the patient's satisfaction.     TOTH III,Estelene Carmack S

## 2016-06-10 NOTE — Anesthesia Preprocedure Evaluation (Addendum)
Anesthesia Evaluation  Patient identified by MRN, date of birth, ID band Patient awake    Reviewed: Allergy & Precautions, H&P , NPO status , Patient's Chart, lab work & pertinent test results  Airway Mallampati: III  TM Distance: >3 FB Neck ROM: Full    Dental no notable dental hx. (+) Poor Dentition, Dental Advisory Given   Pulmonary Current Smoker,    Pulmonary exam normal breath sounds clear to auscultation       Cardiovascular negative cardio ROS   Rhythm:Regular Rate:Normal     Neuro/Psych  Headaches, negative psych ROS   GI/Hepatic negative GI ROS, Neg liver ROS,   Endo/Other  negative endocrine ROS  Renal/GU negative Renal ROS  negative genitourinary   Musculoskeletal   Abdominal   Peds  Hematology negative hematology ROS (+)   Anesthesia Other Findings   Reproductive/Obstetrics negative OB ROS                            Anesthesia Physical Anesthesia Plan  ASA: II  Anesthesia Plan: General   Post-op Pain Management:    Induction: Intravenous  Airway Management Planned: LMA  Additional Equipment:   Intra-op Plan:   Post-operative Plan: Extubation in OR  Informed Consent: I have reviewed the patients History and Physical, chart, labs and discussed the procedure including the risks, benefits and alternatives for the proposed anesthesia with the patient or authorized representative who has indicated his/her understanding and acceptance.   Dental advisory given  Plan Discussed with: CRNA  Anesthesia Plan Comments:        Anesthesia Quick Evaluation

## 2016-06-10 NOTE — Op Note (Signed)
06/10/2016  9:01 PM  PATIENT:  [name]  55 y.o. male  PRE-OPERATIVE DIAGNOSIS:  left axillary abscess  POST-OPERATIVE DIAGNOSIS:  left axillary abscess  PROCEDURE:  Procedure(s): INCISION AND DRAINAGE LEFT AXILLARY ABSCESS (Left)  SURGEON:  Surgeon(s) and Role:    * Jovita Kussmaul, MD - Primary  PHYSICIAN ASSISTANT:   ASSISTANTS: none   ANESTHESIA:   general  EBL:  No intake/output data recorded.  BLOOD ADMINISTERED:none  DRAINS: none   LOCAL MEDICATIONS USED:  MARCAINE     SPECIMEN:  No Specimen  DISPOSITION OF SPECIMEN:  N/A  COUNTS:  YES  TOURNIQUET:  * No tourniquets in log *  DICTATION: .Dragon Dictation   After informed consent was obtained the patient was brought to the operating room and placed in the supine position on the operating room table. After adequate induction of general anesthesia the patient's left axilla was prepped with Betadine and draped in usual sterile manner. An appropriate timeout was performed. The area around the left axillary abscess was infiltrated with half percent Marcaine with epinephrine. A transversely oriented incision was made through the abscess cavity with a 15 blade knife. A large amount of pus was evacuated. Cultures were obtained.There was a second smaller abscess superiorly and this was also opened sharply with a 15 blade knife. The cavity was probed bluntly with the fingertip to break up all loculations. The 2 openings communicated. Once the wound was cleaned the wound was then packed with Kerlix gauze across the 2 openings. Sterile dressings were then applied. The patient tolerated the procedure well. At the end of the case all needle sponge and instrument counts were correct. The patient was then awakened and taken to recovery in stable condition.  PLAN OF CARE: Admit to inpatient   PATIENT DISPOSITION:  PACU - hemodynamically stable.   Delay start of Pharmacological VTE agent (>24hrs) due to surgical blood loss or  risk of bleeding: no

## 2016-06-10 NOTE — Anesthesia Procedure Notes (Signed)
Procedure Name: LMA Insertion Date/Time: 06/10/2016 8:40 PM Performed by: West Pugh Pre-anesthesia Checklist: Patient identified, Emergency Drugs available, Suction available and Patient being monitored Patient Re-evaluated:Patient Re-evaluated prior to inductionOxygen Delivery Method: Circle system utilized Preoxygenation: Pre-oxygenation with 100% oxygen Intubation Type: IV induction Ventilation: Mask ventilation without difficulty LMA: LMA inserted LMA Size: 4.0 Number of attempts: 1 Placement Confirmation: positive ETCO2 and breath sounds checked- equal and bilateral Tube secured with: Tape Dental Injury: Teeth and Oropharynx as per pre-operative assessment

## 2016-06-10 NOTE — H&P (Signed)
[name]. [facility]  Location: [facility] Surgery Patient #: [id number] DOB: [birth year] Married / Language: English / Race: White Male   History of Present Illness  The patient is a 55 year old male who presents with a subcutaneous abscess. [name] is a 55 year old gentleman referred to [place] by [facility] for a 5 day history of swelling and pain to the left axilla. He states that he noticed this a few days ago and has progressively worsened. He was seen by his primary provider 2 days ago and referred to [place] for further care. He has not taken any antibiotics. He is not taking any medication for this. He denies fever. He denies injury. He denies previous history of axillary abscess. He describes the pain as intense. He has a history of orthopedic procedures and excision of skin cancers.   Other Problems Diabetes Mellitus High blood pressure Melanoma Ventral Hernia Repair  Past Surgical History  Shoulder Surgery Bilateral. Ventral / Umbilical Hernia Surgery Bilateral.  Diagnostic Studies History  Colonoscopy never  Allergies  No Known Drug Allergies  Medication History No Current Medications Medications Reconciled  Social History Alcohol use Remotely quit alcohol use. Caffeine use Carbonated beverages. No drug use Tobacco use Current every day smoker.  Family History  Arthritis Mother, Sister. Depression Daughter. Diabetes Mellitus Father. Heart Disease Father. Heart disease in male family member before age 41 Hypertension Father. Kidney Disease Father.    Review of Systems General Present- Appetite Loss, Chills and Fatigue. Not Present- Fever, Night Sweats, Weight Gain and Weight Loss. Skin Present- New Lesions. Not Present- Change in Wart/Mole, Dryness, Hives, Jaundice, Non-Healing Wounds, Rash and Ulcer. HEENT Present- Wears glasses/contact lenses. Not Present- Earache, Hearing Loss, Hoarseness, Nose Bleed, Oral Ulcers,  Ringing in the Ears, Seasonal Allergies, Sinus Pain, Sore Throat, Visual Disturbances and Yellow Eyes. Respiratory Present- Snoring. Not Present- Bloody sputum, Chronic Cough, Difficulty Breathing and Wheezing. Breast Not Present- Breast Mass, Breast Pain, Nipple Discharge and Skin Changes. Cardiovascular Not Present- Chest Pain, Difficulty Breathing Lying Down, Leg Cramps, Palpitations, Rapid Heart Rate, Shortness of Breath and Swelling of Extremities. Gastrointestinal Not Present- Abdominal Pain, Bloating, Bloody Stool, Change in Bowel Habits, Chronic diarrhea, Constipation, Difficulty Swallowing, Excessive gas, Gets full quickly at meals, Hemorrhoids, Indigestion, Nausea, Rectal Pain and Vomiting. Male Genitourinary Present- Nocturia. Not Present- Blood in Urine, Change in Urinary Stream, Frequency, Impotence, Painful Urination, Urgency and Urine Leakage. Musculoskeletal Not Present- Back Pain, Joint Pain, Joint Stiffness, Muscle Pain, Muscle Weakness and Swelling of Extremities. Neurological Present- Headaches. Not Present- Decreased Memory, Fainting, Numbness, Seizures, Tingling, Tremor, Trouble walking and Weakness. Psychiatric Not Present- Anxiety, Bipolar, Change in Sleep Pattern, Depression, Fearful and Frequent crying. Endocrine Present- New Diabetes. Not Present- Cold Intolerance, Excessive Hunger, Hair Changes, Heat Intolerance and Hot flashes. Hematology Not Present- Blood Thinners, Easy Bruising, Excessive bleeding, Gland problems, HIV and Persistent Infections.  Vitals Weight: 162 lb Height: 66in Body Surface Area: 1.83 m Body Mass Index: 26.15 kg/m  Temp.: 98.87F(Temporal)  Pulse: 106 (Regular)  BP: 130/80 (Sitting, Left Arm, Standard)       Physical Exam  General Mental Status-Alert. General Appearance-Cooperative, Well groomed, Consistent with stated age, Not in acute distress. Orientation-Oriented X4.  Integumentary General  Characteristics Overall examination of the patient's skin reveals - no rashes. Color - normal coloration of skin. Skin Moisture - normal skin moisture.  Head and Neck Head-normocephalic, atraumatic with no lesions or palpable masses.  Chest and Lung Exam Chest and lung exam reveals -quiet,  even and easy respiratory effort with no use of accessory muscles.  Neurologic Neurologic evaluation reveals -normal attention span and ability to concentrate and able to name objects and repeat phrases. Appropriate fund of knowledge .  Musculoskeletal Global Assessment Gait and Station - normal gait and station and normal posture. Note: There is a large, approx. 7x4 cm area of tenderness, fluctuance and redness to the L axilla, primarily anteriorly as well as a smaller 2 cm diameter lesion posteriorly on the left consistent with axillary abscesses.     Assessment & Plan  ABSCESS OF LEFT AXILLA [id number]) Impression: The patient was examined with Dr. Marlou Starks. He will need I&D in the OR given extent of his abscess. We will get him set up at [facility] for IV antibiotics and urgent incision and drainage.

## 2016-06-10 NOTE — Transfer of Care (Signed)
Immediate Anesthesia Transfer of Care Note  Patient: [name]  Procedure(s) Performed: Procedure(s): INCISION AND DRAINAGE LEFT AXILLARY ABSCESS (Left)  Patient Location: PACU  Anesthesia Type:General  Level of Consciousness:  sedated, patient cooperative and responds to stimulation  Airway & Oxygen Therapy:Patient Spontanous Breathing and Patient connected to face mask oxgen  Post-op Assessment:  Report given to PACU RN and Post -op Vital signs reviewed and stable  Post vital signs:  Reviewed and stable  Last Vitals:  Vitals:   06/10/16 1812  BP: (!) 112/94  Pulse: (!) 109  Resp: 20  Temp: (!) 123XX123 C    Complications: No apparent anesthesia complications

## 2016-06-11 ENCOUNTER — Observation Stay (HOSPITAL_COMMUNITY): Payer: BLUE CROSS/BLUE SHIELD

## 2016-06-11 ENCOUNTER — Encounter (HOSPITAL_COMMUNITY): Payer: Self-pay

## 2016-06-11 DIAGNOSIS — R0902 Hypoxemia: Secondary | ICD-10-CM

## 2016-06-11 DIAGNOSIS — I2582 Chronic total occlusion of coronary artery: Secondary | ICD-10-CM | POA: Diagnosis present

## 2016-06-11 DIAGNOSIS — I97191 Other postprocedural cardiac functional disturbances following other surgery: Secondary | ICD-10-CM | POA: Diagnosis present

## 2016-06-11 DIAGNOSIS — F17210 Nicotine dependence, cigarettes, uncomplicated: Secondary | ICD-10-CM | POA: Diagnosis present

## 2016-06-11 DIAGNOSIS — E7800 Pure hypercholesterolemia, unspecified: Secondary | ICD-10-CM

## 2016-06-11 DIAGNOSIS — L02412 Cutaneous abscess of left axilla: Secondary | ICD-10-CM

## 2016-06-11 DIAGNOSIS — I214 Non-ST elevation (NSTEMI) myocardial infarction: Secondary | ICD-10-CM | POA: Diagnosis present

## 2016-06-11 DIAGNOSIS — Z794 Long term (current) use of insulin: Secondary | ICD-10-CM | POA: Diagnosis not present

## 2016-06-11 DIAGNOSIS — J9601 Acute respiratory failure with hypoxia: Secondary | ICD-10-CM | POA: Diagnosis present

## 2016-06-11 DIAGNOSIS — E739 Lactose intolerance, unspecified: Secondary | ICD-10-CM | POA: Diagnosis present

## 2016-06-11 DIAGNOSIS — Z9119 Patient's noncompliance with other medical treatment and regimen: Secondary | ICD-10-CM | POA: Diagnosis not present

## 2016-06-11 DIAGNOSIS — N179 Acute kidney failure, unspecified: Secondary | ICD-10-CM | POA: Diagnosis present

## 2016-06-11 DIAGNOSIS — I2510 Atherosclerotic heart disease of native coronary artery without angina pectoris: Secondary | ICD-10-CM | POA: Diagnosis not present

## 2016-06-11 DIAGNOSIS — C439 Malignant melanoma of skin, unspecified: Secondary | ICD-10-CM | POA: Diagnosis present

## 2016-06-11 DIAGNOSIS — I10 Essential (primary) hypertension: Secondary | ICD-10-CM | POA: Diagnosis not present

## 2016-06-11 DIAGNOSIS — E1165 Type 2 diabetes mellitus with hyperglycemia: Secondary | ICD-10-CM | POA: Diagnosis present

## 2016-06-11 DIAGNOSIS — I480 Paroxysmal atrial fibrillation: Secondary | ICD-10-CM | POA: Diagnosis not present

## 2016-06-11 DIAGNOSIS — I25110 Atherosclerotic heart disease of native coronary artery with unstable angina pectoris: Secondary | ICD-10-CM | POA: Diagnosis not present

## 2016-06-11 DIAGNOSIS — I110 Hypertensive heart disease with heart failure: Secondary | ICD-10-CM | POA: Diagnosis present

## 2016-06-11 DIAGNOSIS — I4892 Unspecified atrial flutter: Secondary | ICD-10-CM | POA: Diagnosis present

## 2016-06-11 DIAGNOSIS — J449 Chronic obstructive pulmonary disease, unspecified: Secondary | ICD-10-CM | POA: Diagnosis present

## 2016-06-11 DIAGNOSIS — R7989 Other specified abnormal findings of blood chemistry: Secondary | ICD-10-CM | POA: Diagnosis not present

## 2016-06-11 DIAGNOSIS — B9562 Methicillin resistant Staphylococcus aureus infection as the cause of diseases classified elsewhere: Secondary | ICD-10-CM | POA: Diagnosis present

## 2016-06-11 DIAGNOSIS — Z8249 Family history of ischemic heart disease and other diseases of the circulatory system: Secondary | ICD-10-CM | POA: Diagnosis not present

## 2016-06-11 DIAGNOSIS — J690 Pneumonitis due to inhalation of food and vomit: Secondary | ICD-10-CM | POA: Diagnosis present

## 2016-06-11 DIAGNOSIS — I255 Ischemic cardiomyopathy: Secondary | ICD-10-CM | POA: Diagnosis present

## 2016-06-11 DIAGNOSIS — Z720 Tobacco use: Secondary | ICD-10-CM | POA: Diagnosis not present

## 2016-06-11 DIAGNOSIS — F329 Major depressive disorder, single episode, unspecified: Secondary | ICD-10-CM | POA: Diagnosis present

## 2016-06-11 DIAGNOSIS — Z01810 Encounter for preprocedural cardiovascular examination: Secondary | ICD-10-CM | POA: Diagnosis not present

## 2016-06-11 DIAGNOSIS — I5023 Acute on chronic systolic (congestive) heart failure: Secondary | ICD-10-CM | POA: Diagnosis present

## 2016-06-11 LAB — GLUCOSE, CAPILLARY
GLUCOSE-CAPILLARY: 311 mg/dL — AB (ref 65–99)
GLUCOSE-CAPILLARY: 425 mg/dL — AB (ref 65–99)
GLUCOSE-CAPILLARY: 349 mg/dL — AB (ref 65–99)
Glucose-Capillary: 410 mg/dL — ABNORMAL HIGH (ref 65–99)
Glucose-Capillary: 342 mg/dL — ABNORMAL HIGH (ref 65–99)
Glucose-Capillary: 312 mg/dL — ABNORMAL HIGH (ref 65–99)
Glucose-Capillary: 241 mg/dL — ABNORMAL HIGH (ref 65–99)

## 2016-06-11 LAB — BLOOD GAS, ARTERIAL
ACID-BASE EXCESS: 1.5 mmol/L (ref 0.0–2.0)
Acid-base deficit: 0.8 mmol/L (ref 0.0–2.0)
BICARBONATE: 23.1 meq/L (ref 20.0–24.0)
Bicarbonate: 24.7 mEq/L — ABNORMAL HIGH (ref 20.0–24.0)
DELIVERY SYSTEMS: POSITIVE
DRAWN BY: 257701
Drawn by: 225631
EXPIRATORY PAP: 8
FIO2: 1
FIO2: 85
INSPIRATORY PAP: 12
O2 SAT: 79.5 %
O2 Saturation: 98 %
PH ART: 7.402 (ref 7.350–7.450)
PO2 ART: 120 mmHg — AB (ref 80.0–100.0)
Patient temperature: 98.6
Patient temperature: 98.6
RATE: 17 resp/min
TCO2: 20.3 mmol/L (ref 0–100)
TCO2: 21.4 mmol/L (ref 0–100)
VT: 756 mL
pCO2 arterial: 37.9 mmHg (ref 35.0–45.0)
pCO2 arterial: 36 mmHg (ref 35.0–45.0)
pH, Arterial: 7.451 — ABNORMAL HIGH (ref 7.350–7.450)
pO2, Arterial: 46.9 mmHg — ABNORMAL LOW (ref 80.0–100.0)

## 2016-06-11 LAB — COMPREHENSIVE METABOLIC PANEL
ALK PHOS: 84 U/L (ref 38–126)
ALT: 31 U/L (ref 17–63)
AST: 62 U/L — AB (ref 15–41)
Albumin: 3.5 g/dL (ref 3.5–5.0)
Anion gap: 12 (ref 5–15)
BUN: 28 mg/dL — AB (ref 6–20)
CALCIUM: 8.2 mg/dL — AB (ref 8.9–10.3)
CO2: 26 mmol/L (ref 22–32)
CREATININE: 1.37 mg/dL — AB (ref 0.61–1.24)
Chloride: 92 mmol/L — ABNORMAL LOW (ref 101–111)
GFR calc non Af Amer: 57 mL/min — ABNORMAL LOW (ref >60)
Glucose, Bld: 354 mg/dL — ABNORMAL HIGH (ref 65–99)
Potassium: 3.6 mmol/L (ref 3.5–5.1)
SODIUM: 130 mmol/L — AB (ref 135–145)
Total Bilirubin: 1.1 mg/dL (ref 0.3–1.2)
Total Protein: 7.2 g/dL (ref 6.5–8.1)

## 2016-06-11 LAB — D-DIMER, QUANTITATIVE: D-Dimer, Quant: 1.87 ug/mL-FEU — ABNORMAL HIGH (ref 0.00–0.50)

## 2016-06-11 LAB — CBC
HCT: 42 % (ref 39.0–52.0)
Hemoglobin: 14.4 g/dL (ref 13.0–17.0)
MCH: 28.3 pg (ref 26.0–34.0)
MCHC: 34.3 g/dL (ref 30.0–36.0)
MCV: 82.7 fL (ref 78.0–100.0)
PLATELETS: 152 10*3/uL (ref 150–400)
RBC: 5.08 MIL/uL (ref 4.22–5.81)
RDW: 12.7 % (ref 11.5–15.5)
WBC: 17.7 10*3/uL — ABNORMAL HIGH (ref 4.0–10.5)

## 2016-06-11 LAB — HEMOGLOBIN A1C
HEMOGLOBIN A1C: 12.2 % — AB (ref 4.8–5.6)
Mean Plasma Glucose: 303 mg/dL

## 2016-06-11 LAB — TROPONIN I: Troponin I: 5.9 ng/mL (ref <0.03)

## 2016-06-11 MED ORDER — INSULIN ASPART 100 UNIT/ML ~~LOC~~ SOLN
0.0000 [IU] | Freq: Three times a day (TID) | SUBCUTANEOUS | Status: DC
  Administered 2016-06-11: 11 [IU] via SUBCUTANEOUS
  Administered 2016-06-12: 5 [IU] via SUBCUTANEOUS
  Administered 2016-06-12: 15 [IU] via SUBCUTANEOUS
  Administered 2016-06-12: 5 [IU] via SUBCUTANEOUS

## 2016-06-11 MED ORDER — FUROSEMIDE 10 MG/ML IJ SOLN
20.0000 mg | Freq: Once | INTRAMUSCULAR | Status: AC
  Administered 2016-06-11: 20 mg via INTRAVENOUS

## 2016-06-11 MED ORDER — METOCLOPRAMIDE HCL 5 MG/ML IJ SOLN
5.0000 mg | Freq: Three times a day (TID) | INTRAMUSCULAR | Status: DC
  Administered 2016-06-11 – 2016-06-14 (×9): 5 mg via INTRAVENOUS
  Filled 2016-06-11 (×9): qty 2

## 2016-06-11 MED ORDER — INSULIN ASPART 100 UNIT/ML ~~LOC~~ SOLN
0.0000 [IU] | Freq: Every day | SUBCUTANEOUS | Status: DC
  Administered 2016-06-11: 2 [IU] via SUBCUTANEOUS

## 2016-06-11 MED ORDER — PIPERACILLIN-TAZOBACTAM 3.375 G IVPB
3.3750 g | Freq: Three times a day (TID) | INTRAVENOUS | Status: DC
  Administered 2016-06-11 – 2016-06-15 (×12): 3.375 g via INTRAVENOUS
  Filled 2016-06-11 (×14): qty 50

## 2016-06-11 MED ORDER — IOPAMIDOL (ISOVUE-370) INJECTION 76%
100.0000 mL | Freq: Once | INTRAVENOUS | Status: AC | PRN
  Administered 2016-06-11: 100 mL via INTRAVENOUS

## 2016-06-11 MED ORDER — PIPERACILLIN-TAZOBACTAM 3.375 G IVPB 30 MIN: 3.3750 g | Freq: Three times a day (TID) | INTRAVENOUS | Status: DC

## 2016-06-11 MED ORDER — INSULIN ASPART 100 UNIT/ML ~~LOC~~ SOLN: 10.0000 [IU] | Freq: Once | SUBCUTANEOUS | Status: DC

## 2016-06-11 MED ORDER — INSULIN ASPART 100 UNIT/ML ~~LOC~~ SOLN
9.0000 [IU] | Freq: Once | SUBCUTANEOUS | Status: AC
  Administered 2016-06-11: 9 [IU] via SUBCUTANEOUS

## 2016-06-11 MED ORDER — HEPARIN (PORCINE) IN NACL 100-0.45 UNIT/ML-% IJ SOLN
850.0000 [IU]/h | INTRAMUSCULAR | Status: DC
  Administered 2016-06-11: 850 [IU]/h via INTRAVENOUS
  Filled 2016-06-11: qty 250

## 2016-06-11 MED ORDER — LIVING WELL WITH DIABETES BOOK
Freq: Once | Status: AC
  Administered 2016-06-11: 1
  Filled 2016-06-11: qty 1

## 2016-06-11 MED ORDER — INSULIN GLARGINE 100 UNIT/ML ~~LOC~~ SOLN
10.0000 [IU] | Freq: Every day | SUBCUTANEOUS | Status: DC
  Administered 2016-06-11 – 2016-06-19 (×8): 10 [IU] via SUBCUTANEOUS
  Filled 2016-06-11 (×10): qty 0.1

## 2016-06-11 MED ORDER — VANCOMYCIN HCL IN DEXTROSE 750-5 MG/150ML-% IV SOLN
750.0000 mg | Freq: Three times a day (TID) | INTRAVENOUS | Status: DC
  Administered 2016-06-11 – 2016-06-12 (×3): 750 mg via INTRAVENOUS
  Filled 2016-06-11 (×5): qty 150

## 2016-06-11 MED ORDER — INSULIN ASPART 100 UNIT/ML ~~LOC~~ SOLN
10.0000 [IU] | Freq: Once | SUBCUTANEOUS | Status: AC
  Administered 2016-06-11: 10 [IU] via SUBCUTANEOUS

## 2016-06-11 MED ORDER — HEPARIN SODIUM (PORCINE) 5000 UNIT/ML IJ SOLN
5000.0000 [IU] | Freq: Three times a day (TID) | INTRAMUSCULAR | Status: DC
  Administered 2016-06-11: 5000 [IU] via SUBCUTANEOUS
  Filled 2016-06-11: qty 1

## 2016-06-11 MED ORDER — ATORVASTATIN CALCIUM 80 MG PO TABS
80.0000 mg | ORAL_TABLET | Freq: Every day | ORAL | Status: DC
  Administered 2016-06-11 – 2016-06-18 (×8): 80 mg via ORAL
  Filled 2016-06-11: qty 1
  Filled 2016-06-11: qty 2
  Filled 2016-06-11 (×7): qty 1

## 2016-06-11 MED ORDER — ASPIRIN 81 MG PO CHEW
324.0000 mg | CHEWABLE_TABLET | ORAL | Status: AC
  Administered 2016-06-11: 324 mg via ORAL
  Filled 2016-06-11: qty 4

## 2016-06-11 MED ORDER — ASPIRIN 81 MG PO CHEW
81.0000 mg | CHEWABLE_TABLET | Freq: Every day | ORAL | Status: DC
  Administered 2016-06-12 – 2016-06-19 (×8): 81 mg via ORAL
  Filled 2016-06-11 (×8): qty 1

## 2016-06-11 MED ORDER — FUROSEMIDE 10 MG/ML IJ SOLN
INTRAMUSCULAR | Status: AC
  Filled 2016-06-11: qty 2

## 2016-06-11 MED ORDER — PROMETHAZINE HCL 25 MG/ML IJ SOLN
12.5000 mg | Freq: Four times a day (QID) | INTRAMUSCULAR | Status: DC | PRN
  Administered 2016-06-11: 12.5 mg via INTRAVENOUS
  Filled 2016-06-11: qty 1

## 2016-06-11 MED ORDER — HEPARIN BOLUS VIA INFUSION
3000.0000 [IU] | Freq: Once | INTRAVENOUS | Status: AC
  Administered 2016-06-11: 3000 [IU] via INTRAVENOUS
  Filled 2016-06-11: qty 3000

## 2016-06-11 NOTE — Progress Notes (Addendum)
ANTICOAGULATION CONSULT NOTE - Initial Consult  Pharmacy Consult for Heparin Indication: chest pain/ACS  No Known Allergies  Patient Measurements: Height: 5\' 6"  (167.6 cm) Weight: 161 lb 9.6 oz (73.3 kg) IBW/kg (Calculated) : 63.8 Heparin Dosing Weight: 73 kg  Vital Signs: BP: 112/68 (08/24 1800) Pulse Rate: 108 (08/24 1800)  Labs:  Recent Labs  06/10/16 1845 06/11/16 1723  HGB 13.5 14.4  HCT 40.9 42.0  PLT 125* 152  LABPROT 13.2  --   INR 1.00  --   CREATININE 0.76 1.37*  TROPONINI  --  5.90*    Estimated Creatinine Clearance: 55 mL/min (by C-G formula based on SCr of 1.37 mg/dL).   Medical History: Past Medical History:  Diagnosis Date  . Abrasion L FOREARM  . Diabetes mellitus without complication ([place])   . Headache(784.0)   . Hematuria, microscopic "SINCE I WAS A KID"  . Hypertension   . Lactose intolerance   . Rotator cuff tear RIGHT    Medications:  Scheduled:  . buPROPion  150 mg Oral Daily  . heparin subcutaneous  5,000 Units Subcutaneous Q8H  . insulin aspart  0-15 Units Subcutaneous TID WC  . insulin aspart  0-5 Units Subcutaneous QHS  . insulin glargine  10 Units Subcutaneous Daily  . lisinopril  10 mg Oral Daily  . metoCLOPramide (REGLAN) injection  5 mg Intravenous Q8H  . nicotine  7 mg Transdermal Daily  . piperacillin-tazobactam (ZOSYN)  IV  3.375 g Intravenous Q8H  . vancomycin  750 mg Intravenous Q8H   Infusions:  . lactated ringers 75 mL/hr at 06/11/16 0025   PRN: morphine injection, ondansetron (ZOFRAN) IV, oxyCODONE-acetaminophen, promethazine  Assessment: 55 yo M admitted on 8/23 for I &D of left axillary abscess complicated by hyperglycemia post-op. Today, he was noted to be more lethargic, sleepy and hypoxic. CT chest- no acute PE. Pt has elevated troponin. Per MD, start heparin for MI.  Goal of Therapy:  Heparin level 0.3-0.7 units/ml Monitor platelets by anticoagulation protocol: Yes   Plan:  Give 3000 units IV x 1 of  heparin then drip at 850 units/hr. Will check 6 hr HL  Daily HL and CBC Will d/c heparin 5000 units SQ Q8h (last dose @1535 )  Garnet Sierras 06/11/2016,7:01 PM   Addendum: Discussed with Will Creig Hines. Ok to start heparin drip and to give heparin bolus (40 units/kg).  06/11/16 @1923 

## 2016-06-11 NOTE — Consult Note (Addendum)
[facility] Medical Consultation  [name] [id number] DOB: October 08, 1961 DOA: 06/10/2016 PCP: Kennyth Arnold, FNP   Requesting physician: General surgery Date of consultation: 06/11/2016 Reason for consultation: poorly controlled DM  HPI:  This is a 55 year old male with history of diabetes mellitus, diagnosed about 4 years ago, for which he is on metformin and glipizide, hypertension for which she is on lisinopril as well as hyperlipidemia taking simvastatin. He was admitted to general surgery service yesterday after being seen in office and underwent an IND of the left axillary abscess. During his hospital stay, he was found to have progressively elevated CBGs which went to the 400s this morning and this afternoon. His hemoglobin A1c was checked and was found to be elevated at 12. Patient admits to noncompliance to his CBG checks ("once in a while"), as well as noncompliance with his home medications. He denies any fever or chills, endorses profuse sweats while in the hospital bed. He has no chest pain, no abdominal pain, no nausea or vomiting, and he is otherwise feeling his baseline.  Review of Systems:  As per history of present illness, otherwise 10 point review of systems negative   Impression/Recommendations Active Problems:   Type 2 diabetes mellitus, uncontrolled ([place])   Essential hypertension   Pure hypercholesterolemia   Abscess of axilla, left    1. Poorly controlled diabetes mellitus - will place patient on Lantus 10 units, will give 10 units of NovoLog right now and his sliding scale has already been changed to moderate by [facility]. Will monitor response, and may need glucose stabilizer for 6 hours or so if he continues to persist in the 400s. Diabetes educator to see. Discussed extensively with the patient bedside, he will need insulin at home given elevated A1c. 2. Left axillary abscess - per general surgery, suspect plays a role into elevated CBGs given active  infection as well as abscess formation due to poorly controlled diabetes 3. Hypertension - blood pressure controlled today, continue home lisinopril  4. Depression - continue Wellbutrin    Addendum 5 pm Called by RN around 4:30 pm that patient has been more lethargic and sleepy and he is having difficulties maintaining oxygen saturations. Evaluated patient bedside, he is lethargic however wakes up and answers basic questions. Denies chest pain or palpitations. He feels "mild shortness of breath, but not too bad". Falls back asleep easily when not interacted with. Blood pressure stable. STAT CBG mid 300s. RRR, moves air bilaterally. Transfer to SDU, consulted PCCM. STAT CXR, D dimer, repeat rainbow labs. ABG with profound hypoxia. PH 7.40, pCO2 38, pO2 47. Place on BiPAP, if he doesn't maintain his sats will need to be intubated.   TRH will be primary given decompensation. Discussed with surgery.  Addendum 7 pm Troponin came back elevated to 5.9. CT angio negative for PE Patient likely has an NSTEMI. Discussed with night coverage Sch[place], will consult cardiology.   Past Medical History:  Diagnosis Date  . Abrasion L FOREARM  . Diabetes mellitus without complication ([place])   . Headache(784.0)   . Hematuria, microscopic "SINCE I WAS A KID"  . Hypertension   . Lactose intolerance   . Rotator cuff tear RIGHT   Past Surgical History:  Procedure Laterality Date  . HERNIA REPAIR  X2  . INCISION AND DRAINAGE ABSCESS Left 06/10/2016   Procedure: INCISION AND DRAINAGE LEFT AXILLARY ABSCESS;  Surgeon: Autumn Messing III, MD;  Location: [facility]S;  Service: General;  Laterality: Left;  . ROTATOR  CUFF REPAIR  LEFT   Social History:  reports that he has been smoking.  He has been smoking about 1.50 packs per day. He has never used smokeless tobacco. He reports that he does not drink alcohol or use drugs.  No Known Allergies Family History  Problem Relation Age of Onset  . Hypertension Father   . Sudden  death Father   . Diabetes Father   . Hypertension Mother     Prior to Admission medications   Medication Sig Start Date End Date Taking? Authorizing Provider  Aspirin-Salicylamide-Caffeine (BC HEADACHE POWDER PO) Take 1 each by mouth daily as needed (pain).   Yes Historical Provider, MD  buPROPion (WELLBUTRIN XL) 150 MG 24 hr tablet Take 1 tablet (150 mg total) by mouth daily. Patient not taking: Reported on 06/10/2016 05/23/13   Kennyth Arnold, FNP  glipiZIDE (GLUCOTROL) 10 MG tablet take 1 tablet by mouth twice a day BEFORE A MEAL Patient not taking: Reported on 06/10/2016    Kennyth Arnold, FNP  lisinopril (PRINIVIL,ZESTRIL) 20 MG tablet take 1/2 tablet by mouth once daily Patient not taking: Reported on 06/10/2016    Kennyth Arnold, FNP  metFORMIN (GLUCOPHAGE) 1000 MG tablet take 1 tablet by mouth twice a day WITH A MEAL Patient not taking: Reported on 06/10/2016    Kennyth Arnold, FNP  simvastatin (ZOCOR) 20 MG tablet take 1 tablet by mouth at bedtime Patient not taking: Reported on 06/10/2016    Kennyth Arnold, FNP   Physical Exam: Blood pressure 116/79, pulse 74, temperature 99.1 F (37.3 C), temperature source Oral, resp. rate 16, height 5\' 6"  (1.676 m), weight 73.3 kg (161 lb 9.6 oz), SpO2 96 %. Vitals:   06/10/16 2212 06/11/16 0627  BP: 134/83 116/79  Pulse: (!) 104 74  Resp: 14 16  Temp: 99 F (37.2 C) 99.1 F (37.3 C)     General:  NAD, pleasant  Eyes: PERRL, no scleral icterus  Neck: supple  Cardiovascular: RRR without MRG, no JVD, no edema  Respiratory: CTA biL  Abdomen: soft, non tender  Skin: left axillary abscess, packed  Psychiatric: normal mood and affect  Neurologic: non focal  Labs on Admission:  Basic Metabolic Panel:  Recent Labs Lab 06/10/16 1845  NA 137  K 3.4*  CL 101  CO2 28  GLUCOSE 215*  BUN 15  CREATININE 0.76  CALCIUM 8.6*   Liver Function Tests:  Recent Labs Lab 06/10/16 1845  AST 19  ALT 25  ALKPHOS 87  BILITOT 0.7    PROT 7.4  ALBUMIN 3.8   No results for input(s): LIPASE, AMYLASE in the last 168 hours. No results for input(s): AMMONIA in the last 168 hours. CBC:  Recent Labs Lab 06/10/16 1845  WBC 11.3*  HGB 13.5  HCT 40.9  MCV 82.6  PLT 125*   Cardiac Enzymes: No results for input(s): CKTOTAL, CKMB, CKMBINDEX, TROPONINI in the last 168 hours. BNP: Invalid input(s): POCBNP CBG:  Recent Labs Lab 06/10/16 2123 06/10/16 2219 06/11/16 0219 06/11/16 0611 06/11/16 1210  GLUCAP 182* 218* 311* 425* 38*    Marzetta Board [facility] Pager [phone]  If 7PM-7AM, please contact night-coverage [email or web address] Password [facility] 06/11/2016, 12:41 PM

## 2016-06-11 NOTE — Progress Notes (Signed)
Pt got suddenly SOB, Dr. Cruzita Lederer called me after seeing and transferring to ICU.  He is going on BiPap now. 12/8 with better Sats now.  Rapid response called, sats down into the 67-76% range.  ABG 7.40 PCO -37.9, PO2 46.9, HCO3 23.  CCM has also been called.  Dr. Ninfa Linden is also aware of patients decline.

## 2016-06-11 NOTE — Progress Notes (Signed)
Called Dr. Emmit Alexanders about critical labs and results of EKG and CTA.  He will consult cardiology.

## 2016-06-11 NOTE — Progress Notes (Signed)
PT transported to [facility] CT on 100% Fio2 will on BiPAP.

## 2016-06-11 NOTE — Consult Note (Addendum)
PULMONARY / CRITICAL CARE MEDICINE   Name: [name] MRN: [id number] DOB: 01-Aug-1961    ADMISSION DATE:  06/10/2016 CONSULTATION DATE:  06/11/16  REFERRING MD:  [facility]  CHIEF COMPLAINT:  Acute onset dyspnea, hypoxia.  HISTORY OF PRESENT ILLNESS:   [name] is a 55 year old with history of uncontrolled type 2 diabetes, hypertension, hyperlipidemia. He was admitted on 8/23 for incision and drainage of left axillary abscess. Postop course complicated by hyperglycemia for which when TRH was consulted. Today about 4:30 patient was noted to be more lethargic, sleepy, hypoxic. ABG shows 7.40/38/47/79%. He was placed on BiPAP and transferred to stepdown. PCCM consulted to assess need for intubation.  After he was placed on BiPAP the respiratory status has improved. He has normal work of breathing with sats in the 90s. He is awake and alert, oriented and answers questions appropriately. He is an active smoker, up to one half packs per day. He's never been told he says he has COPD but has symptoms of chronic cough with mucus production. He also has snoring at night but no sleep study done.  PAST MEDICAL HISTORY :  He  has a past medical history of Abrasion (L FOREARM); Diabetes mellitus without complication ([name]); Headache(784.0); Hematuria, microscopic ("SINCE I WAS A KID"); Hypertension; Lactose intolerance; and Rotator cuff tear (RIGHT).  PAST SURGICAL HISTORY: He  has a past surgical history that includes Hernia repair (X2); Rotator cuff repair (LEFT); and Incision and drainage abscess (Left, 06/10/2016).  No Known Allergies  No current facility-administered medications on file prior to encounter.    Current Outpatient Prescriptions on File Prior to Encounter  Medication Sig  . buPROPion (WELLBUTRIN XL) 150 MG 24 hr tablet Take 1 tablet (150 mg total) by mouth daily. (Patient not taking: Reported on 06/10/2016)  . glipiZIDE (GLUCOTROL) 10 MG tablet take 1 tablet by mouth twice a day BEFORE A  MEAL (Patient not taking: Reported on 06/10/2016)  . lisinopril (PRINIVIL,ZESTRIL) 20 MG tablet take 1/2 tablet by mouth once daily (Patient not taking: Reported on 06/10/2016)  . metFORMIN (GLUCOPHAGE) 1000 MG tablet take 1 tablet by mouth twice a day WITH A MEAL (Patient not taking: Reported on 06/10/2016)  . simvastatin (ZOCOR) 20 MG tablet take 1 tablet by mouth at bedtime (Patient not taking: Reported on 06/10/2016)    FAMILY HISTORY:  His indicated that the status of his mother is unknown. He indicated that his father is deceased.    SOCIAL HISTORY: He  reports that he has been smoking.  He has been smoking about 1.50 packs per day. He has never used smokeless tobacco. He reports that he does not drink alcohol or use drugs.  REVIEW OF SYSTEMS:   As noted above. Denies any dyspnea. Positive for chronic cough with white mucus. No wheezing, hemoptysis. No fevers, chills. Positive for nausea, vomiting. No chest pain, palpitation. All other ROS are negative.   SUBJECTIVE:   VITAL SIGNS: BP 116/79 (BP Location: Right Arm)   Pulse 74   Temp 99.1 F (37.3 C) (Oral)   Resp 16   Ht 5\' 6"  (1.676 m)   Wt 161 lb 9.6 oz (73.3 kg)   SpO2 97%   BMI 26.08 kg/m   HEMODYNAMICS:    VENTILATOR SETTINGS: Vent Mode: Other (Comment) FiO2 (%):  [85 %] 85 % Set Rate:  [12 bmp] 12 bmp PEEP:  [8 cmH20] 8 cmH20  INTAKE / OUTPUT: I/O last 3 completed shifts: In: 1089 [I.V.:1089] Out: 12 [Urine:800; Blood:10]  PHYSICAL EXAMINATION: General: Elderly white male on BiPAP. No apparent distress. Neuro:  Moves all 4 extremities, no focal deficits HEENT: PERRLA, no thyromegaly, JVD Cardiovascular:  Regular rate and rhythm, no murmurs rubs gallops Lungs:  Clear, no wheeze, crackles Abdomen:  Soft, positive bowel sounds, nontender, nondistended Musculoskeletal:  Normal tone and bulk. No edema Skin:  No rash  LABS:  BMET  Recent Labs Lab 06/10/16 1845  NA 137  K 3.4*  CL 101  CO2 28   BUN 15  CREATININE 0.76  GLUCOSE 215*    Electrolytes  Recent Labs Lab 06/10/16 1845  CALCIUM 8.6*    CBC  Recent Labs Lab 06/10/16 1845 06/11/16 1723  WBC 11.3* 17.7*  HGB 13.5 14.4  HCT 40.9 42.0  PLT 125* 152    Coag's  Recent Labs Lab 06/10/16 1845  INR 1.00    Sepsis Markers No results for input(s): LATICACIDVEN, PROCALCITON, O2SATVEN in the last 168 hours.  ABG  Recent Labs Lab 06/11/16 1730  PHART 7.402  PCO2ART 37.9  PO2ART 46.9*    Liver Enzymes  Recent Labs Lab 06/10/16 1845  AST 19  ALT 25  ALKPHOS 87  BILITOT 0.7  ALBUMIN 3.8    Cardiac Enzymes No results for input(s): TROPONINI, PROBNP in the last 168 hours.  Glucose  Recent Labs Lab 06/10/16 2219 06/11/16 0219 06/11/16 0611 06/11/16 1210 06/11/16 1553 06/11/16 1642  GLUCAP 218* 311* 425* 410* 349* 342*    Imaging Dg Chest Port 1 View  Result Date: 06/11/2016 CLINICAL DATA:  Hypoxia, weakness today. History of hypertension, diabetes. EXAM: PORTABLE CHEST 1 VIEW COMPARISON:  Chest radiograph December 07, 2011 FINDINGS: The cardiac silhouette is mildly enlarged. Pulmonary vascular congestion and mild interstitial prominence without pleural effusion or focal consolidation. No pneumothorax tiny scattered granulomata. Soft tissue planes included osseous structures are nonsuspicious, high-riding RIGHT humeral head suggests old rotator cuff injury. IMPRESSION: Mild cardiomegaly. Pulmonary vascular congestion. Interstitial prominence favoring pulmonary edema, less likely atypical pneumonia. Electronically Signed   By: Elon Alas M.D.   On: 06/11/2016 17:47   STUDIES:  Chest X ray 8/24 > Vascular congestion. Images reviewed. CTA 8/24 >  CULTURES: Abscess culture 8/23 > GPCs  ANTIBIOTICS: Zosyn 8/23 > Vanc 8/23 >  SIGNIFICANT EVENTS: 6/23 - I and D of axillary abscess.  LINES/TUBES:   DISCUSSION: 55 year old s/p I and D of left axillary abscess, GPC's in  wound culture. He has sudden onset hypoxia. He is at risk for PE, especially given his recent surgery, positive d-dimer. He is going to get CTA of the chest. He does not appear to have COPD exacerbation. Chest x-ray shows mild vascular congestion and he will benefit from diuresis.  He appears stable on BiPAP. There is no need at present state for intubation. We'll continue to monitor, follow on the CT of the chest, repeat ABG.  RECCS: - Continue Bipap - Nebs prn - Recheck ABG - Follow CTA - Lasix one dose 20 mg IV if there is no PE. Monitor hemodynamics and urine output - Check EKG, troponin to R/O ACS - Continue to monitor need for intubation.  Marshell Garfinkel MD [facility] Pager [phone] If no answer or after 3pm call: [phone] 06/11/2016, 6:23 PM

## 2016-06-11 NOTE — Progress Notes (Signed)
Pharmacy Antibiotic Note  [name] is a 55 y.o. male admitted on 06/10/2016 with wound infection.  Pharmacy has been consulted for vancomycin dosing.  Plan: Vancomycin 750mg  IV q8h Zosyn (per MD) 3.375mg  IV q8h Follow renal function, cultures, clinical course vanc trough at steady state  Height: 5\' 6"  (167.6 cm) Weight: 161 lb 9.6 oz (73.3 kg) IBW/kg (Calculated) : 63.8  Temp (24hrs), Avg:99 F (37.2 C), Min:97.5 F (36.4 C), Max:100.6 F (38.1 C)   Recent Labs Lab 06/10/16 1845  WBC 11.3*  CREATININE 0.76    Estimated Creatinine Clearance: 94.1 mL/min (by C-G formula based on SCr of 0.8 mg/dL).    No Known Allergies  Antimicrobials this admission: 8/24 vanc >> 8/24 zosyn >> Dose adjustments this admission:   Microbiology results:  8/23 MRSA PCR: negative  Thank you for allowing pharmacy to be a part of this patient's care.  Dolly Rias RPh 06/11/2016, 1:34 PM Pager [phone]

## 2016-06-11 NOTE — Progress Notes (Signed)
Pt. Tolerating current settings well, is alert adjusted original setting per ABG results, RT to monitor.

## 2016-06-11 NOTE — Progress Notes (Signed)
1 Day Post-Op  Subjective: Glucose is 410, he is very diaphoretic, mentation is fine, Daughter just walked in. They did not know he had quit taking diabetic meds.   Objective: Vital signs in last 24 hours: Temp:  [97.5 F (36.4 C)-100.6 F (38.1 C)] 99.1 F (37.3 C) (08/24 0627) Pulse Rate:  [74-109] 74 (08/24 0627) Resp:  [14-20] 16 (08/24 0627) BP: (109-134)/(72-94) 116/79 (08/24 0627) SpO2:  [95 %-99 %] 96 % (08/24 0627) Weight:  [73.3 kg (161 lb 9.6 oz)] 73.3 kg (161 lb 9.6 oz) (08/23 1812) Last BM Date: 06/10/16 Nothing pO recorded Urine 800 TM 100.6 Nothing since 3 AM recorded No new labs Intake/Output from previous day: 08/23 0701 - 08/24 0700 In: 1089 [I.V.:1089] Out: 810 [Urine:800; Blood:10] Intake/Output this shift: No intake/output data recorded.  General appearance: alert, cooperative and no distress Resp: clear to auscultation bilaterally Skin: Skin color, texture, turgor normal. No rashes or lesions or open site is clean, it has a kerlix coming thru 2 open sites.  I pulled out the largest portion of the larger site.  I will redress and watch for now.    Lab Results:   Recent Labs  06/10/16 1845  WBC 11.3*  HGB 13.5  HCT 40.9  PLT 125*    BMET  Recent Labs  06/10/16 1845  NA 137  K 3.4*  CL 101  CO2 28  GLUCOSE 215*  BUN 15  CREATININE 0.76  CALCIUM 8.6*   PT/INR  Recent Labs  06/10/16 1845  LABPROT 13.2  INR 1.00     Recent Labs Lab 06/10/16 1845  AST 19  ALT 25  ALKPHOS 87  BILITOT 0.7  PROT 7.4  ALBUMIN 3.8     Lipase  No results found for: LIPASE   Studies/Results: No results found.  Prior to Admission medications   Medication Sig Start Date End Date Taking? Authorizing Provider  Aspirin-Salicylamide-Caffeine (BC HEADACHE POWDER PO) Take 1 each by mouth daily as needed (pain).   Yes Historical Provider, MD  buPROPion (WELLBUTRIN XL) 150 MG 24 hr tablet Take 1 tablet (150 mg total) by mouth daily. Patient not  taking: Reported on 06/10/2016 05/23/13   Kennyth Arnold, FNP  glipiZIDE (GLUCOTROL) 10 MG tablet take 1 tablet by mouth twice a day BEFORE A MEAL Patient not taking: Reported on 06/10/2016    Kennyth Arnold, FNP  lisinopril (PRINIVIL,ZESTRIL) 20 MG tablet take 1/2 tablet by mouth once daily Patient not taking: Reported on 06/10/2016    Kennyth Arnold, FNP  metFORMIN (GLUCOPHAGE) 1000 MG tablet take 1 tablet by mouth twice a day WITH A MEAL Patient not taking: Reported on 06/10/2016    Kennyth Arnold, FNP  simvastatin (ZOCOR) 20 MG tablet take 1 tablet by mouth at bedtime Patient not taking: Reported on 06/10/2016    Kennyth Arnold, FNP   PCP:  Kennyth Arnold, FNP   Medications: . buPROPion  150 mg Oral Daily  . insulin aspart  0-9 Units Subcutaneous Q6H  . lisinopril  10 mg Oral Daily  . nicotine  7 mg Transdermal Daily   . lactated ringers 75 mL/hr at 06/11/16 0025    Assessment/Plan Left axillary abscess S/p I&D left axillary abscess AODM - Hemoglobin A1C  12.2 Glucose 410 Hypertension Dyslipidemia  FEN: carb mod ID: [id number] day 2 DVT:  SCD    Plan:  I called Diabetes coordinator early when I saw A1C.  Calling Medicine to see and assist with  diabetes.     LOS: 1 day    [name] 06/11/2016 [phone]

## 2016-06-11 NOTE — Consult Note (Addendum)
CONSULTATION NOTE  Reason for Consult: Elevated troponin, acute respiratory failure  Requesting Physician: Dr. Vaughan Browner  Cardiologist: None (NEW)  HPI: This is a 55 y.o. male with a past medical history significant for HTN, poorly controlled DM2, and dyslipidemia. He was admitted yesterday for I&D of a left axillary abscess. Today he was noted to become more lethargic and hypoxic. Arterial blood gas showed significant hypoxemia and he was placed on BiPAP and critical care medicine was consult and transferred to step down. He's also been noted to be markedly diaphoretic but denies any chest pain. He had laboratory work including troponin sent which resulted abnormal at 5.9. This suggests a probable peri-procedural MI. EKG shows sinus rhythm with new inferior Q waves and lateral ST depressions which are less than 1 mm.  PMHx:  Past Medical History:  Diagnosis Date  . Abrasion L FOREARM  . Diabetes mellitus without complication ([place])   . Headache(784.0)   . Hematuria, microscopic "SINCE I WAS A KID"  . Hypertension   . Lactose intolerance   . Rotator cuff tear RIGHT   Past Surgical History:  Procedure Laterality Date  . HERNIA REPAIR  X2  . INCISION AND DRAINAGE ABSCESS Left 06/10/2016   Procedure: INCISION AND DRAINAGE LEFT AXILLARY ABSCESS;  Surgeon: Autumn Messing III, MD;  Location: [facility];  Service: General;  Laterality: Left;  . ROTATOR CUFF REPAIR  LEFT    FAMHx: Family History  Problem Relation Age of Onset  . Hypertension Father   . Sudden death Father   . Diabetes Father   . Hypertension Mother     SOCHx:  reports that he has been smoking.  He has been smoking about 1.50 packs per day. He has never used smokeless tobacco. He reports that he does not drink alcohol or use drugs.  ALLERGIES: No Known Allergies  ROS: Review of systems not obtained due to patient factors. (patient on BiPAP)  HOME MEDICATIONS: Reviewed  HOSPITAL MEDICATIONS: I have reviewed the  patient's current medications.  VITALS: Blood pressure 105/71, pulse (!) 103, temperature 99.1 F (37.3 C), temperature source Oral, resp. rate (!) 21, height '5\' 6"'  (1.676 m), weight 161 lb 9.6 oz (73.3 kg), SpO2 100 %.  PHYSICAL EXAM: General appearance: alert, moderate distress and Sitting upright on BiPAP Neck: JVD - 3 cm above sternal notch and no carotid bruit Lungs: diminished breath sounds bibasilar and rales bibasilar Heart: Regular tachycardia Abdomen: soft, non-tender; bowel sounds normal; no masses,  no organomegaly Extremities: extremities normal, atraumatic, no cyanosis or edema and Left axillary abscess site bandaged Pulses: 2+ and symmetric Skin: Pale, diaphoretic Neurologic: Alert and oriented X 3, normal strength and tone. Normal symmetric reflexes. Normal coordination and gait Psych: Pleasant  LABS: Results for orders placed or performed during the hospital encounter of 06/10/16 (from the past 48 hour(s))  Glucose, capillary     Status: Abnormal   Collection Time: 06/10/16  6:10 PM  Result Value Ref Range   Glucose-Capillary 252 (H) 65 - 99 mg/dL   Comment 1 Notify RN    Comment 2 Document in Chart   Surgical pcr screen     Status: None   Collection Time: 06/10/16  6:39 PM  Result Value Ref Range   MRSA, PCR NEGATIVE NEGATIVE   Staphylococcus aureus NEGATIVE NEGATIVE    Comment:        The Xpert SA Assay (FDA approved for NASAL specimens in patients over 28 years of age), is one component of  a comprehensive surveillance program.  Test performance has been validated by [facility] for patients greater than or equal to 6 year old. It is not intended to diagnose infection nor to guide or monitor treatment.   Hemoglobin A1c     Status: Abnormal   Collection Time: 06/10/16  6:45 PM  Result Value Ref Range   Hgb A1c MFr Bld 12.2 (H) 4.8 - 5.6 %    Comment: (NOTE)         Pre-diabetes: 5.7 - 6.4         Diabetes: >6.4         Glycemic control for adults  with diabetes: <7.0    Mean Plasma Glucose 303 mg/dL    Comment: (NOTE) Performed At: [facility] [place], [place] [id number] Lindon Romp MD YC:[id number]   CBC     Status: Abnormal   Collection Time: 06/10/16  6:45 PM  Result Value Ref Range   WBC 11.3 (H) 4.0 - 10.5 K/uL   RBC 4.95 4.22 - 5.81 MIL/uL   Hemoglobin 13.5 13.0 - 17.0 g/dL   HCT 40.9 39.0 - 52.0 %   MCV 82.6 78.0 - 100.0 fL   MCH 27.3 26.0 - 34.0 pg   MCHC 33.0 30.0 - 36.0 g/dL   RDW 12.8 11.5 - 15.5 %   Platelets 125 (L) 150 - 400 K/uL  Comprehensive metabolic panel     Status: Abnormal   Collection Time: 06/10/16  6:45 PM  Result Value Ref Range   Sodium 137 135 - 145 mmol/L   Potassium 3.4 (L) 3.5 - 5.1 mmol/L   Chloride 101 101 - 111 mmol/L   CO2 28 22 - 32 mmol/L   Glucose, Bld 215 (H) 65 - 99 mg/dL   BUN 15 6 - 20 mg/dL   Creatinine, Ser 0.76 0.61 - 1.24 mg/dL   Calcium 8.6 (L) 8.9 - 10.3 mg/dL   Total Protein 7.4 6.5 - 8.1 g/dL   Albumin 3.8 3.5 - 5.0 g/dL   AST 19 15 - 41 U/L   ALT 25 17 - 63 U/L   Alkaline Phosphatase 87 38 - 126 U/L   Total Bilirubin 0.7 0.3 - 1.2 mg/dL   GFR calc non Af Amer >60 >60 mL/min   GFR calc Af Amer >60 >60 mL/min    Comment: (NOTE) The eGFR has been calculated using the CKD EPI equation. This calculation has not been validated in all clinical situations. eGFR's persistently <60 mL/min signify possible Chronic Kidney Disease.    Anion gap 8 5 - 15  Protime-INR     Status: None   Collection Time: 06/10/16  6:45 PM  Result Value Ref Range   Prothrombin Time 13.2 11.4 - 15.2 seconds   INR 1.00   Aerobic/Anaerobic Culture (surgical/deep wound)     Status: None (Preliminary result)   Collection Time: 06/10/16  8:50 PM  Result Value Ref Range   Specimen Description ABSCESS LEFT AXILLA    Special Requests PATIENT ON FOLLOWING ZOSYN    Gram Stain      ABUNDANT WBC PRESENT,BOTH PMN AND MONONUCLEAR ABUNDANT GRAM POSITIVE COCCI IN CLUSTERS  IN PAIRS    Culture      CULTURE REINCUBATED FOR BETTER GROWTH Performed at [facility]    Report Status PENDING   Glucose, capillary     Status: Abnormal   Collection Time: 06/10/16  9:23 PM  Result Value Ref Range   Glucose-Capillary 182 (H) 65 -  99 mg/dL  Glucose, capillary     Status: Abnormal   Collection Time: 06/10/16 10:19 PM  Result Value Ref Range   Glucose-Capillary 218 (H) 65 - 99 mg/dL   Comment 1 Notify RN   Glucose, capillary     Status: Abnormal   Collection Time: 06/11/16  2:19 AM  Result Value Ref Range   Glucose-Capillary 311 (H) 65 - 99 mg/dL   Comment 1 Notify RN   Glucose, capillary     Status: Abnormal   Collection Time: 06/11/16  6:11 AM  Result Value Ref Range   Glucose-Capillary 425 (H) 65 - 99 mg/dL   Comment 1 Notify RN   Glucose, capillary     Status: Abnormal   Collection Time: 06/11/16 12:10 PM  Result Value Ref Range   Glucose-Capillary 410 (H) 65 - 99 mg/dL   Comment 1 Notify RN    Comment 2 Document in Chart   Glucose, capillary     Status: Abnormal   Collection Time: 06/11/16  3:53 PM  Result Value Ref Range   Glucose-Capillary 349 (H) 65 - 99 mg/dL  Glucose, capillary     Status: Abnormal   Collection Time: 06/11/16  4:42 PM  Result Value Ref Range   Glucose-Capillary 342 (H) 65 - 99 mg/dL  D-dimer, quantitative (not at [facility])     Status: Abnormal   Collection Time: 06/11/16  5:23 PM  Result Value Ref Range   D-Dimer, Quant 1.87 (H) 0.00 - 0.50 ug/mL-FEU    Comment: (NOTE) At the manufacturer cut-off of 0.50 ug/mL FEU, this assay has been documented to exclude PE with a sensitivity and negative predictive value of 97 to 99%.  At this time, this assay has not been approved by the FDA to exclude DVT/VTE. Results should be correlated with clinical presentation.   Troponin I     Status: Abnormal   Collection Time: 06/11/16  5:23 PM  Result Value Ref Range   Troponin I 5.90 (HH) <0.03 ng/mL    Comment: CRITICAL RESULT CALLED  TO, READ BACK BY AND VERIFIED WITH: HOEFLER,S AT 1820 ON 06/11/16 BY MOSLEY,J   Comprehensive metabolic panel     Status: Abnormal   Collection Time: 06/11/16  5:23 PM  Result Value Ref Range   Sodium 130 (L) 135 - 145 mmol/L    Comment: DELTA CHECK NOTED   Potassium 3.6 3.5 - 5.1 mmol/L   Chloride 92 (L) 101 - 111 mmol/L   CO2 26 22 - 32 mmol/L   Glucose, Bld 354 (H) 65 - 99 mg/dL   BUN 28 (H) 6 - 20 mg/dL   Creatinine, Ser 1.37 (H) 0.61 - 1.24 mg/dL   Calcium 8.2 (L) 8.9 - 10.3 mg/dL   Total Protein 7.2 6.5 - 8.1 g/dL   Albumin 3.5 3.5 - 5.0 g/dL   AST 62 (H) 15 - 41 U/L   ALT 31 17 - 63 U/L   Alkaline Phosphatase 84 38 - 126 U/L   Total Bilirubin 1.1 0.3 - 1.2 mg/dL   GFR calc non Af Amer 57 (L) >60 mL/min   GFR calc Af Amer >60 >60 mL/min    Comment: (NOTE) The eGFR has been calculated using the CKD EPI equation. This calculation has not been validated in all clinical situations. eGFR's persistently <60 mL/min signify possible Chronic Kidney Disease.    Anion gap 12 5 - 15  CBC     Status: Abnormal   Collection Time: 06/11/16  5:23 PM  Result Value Ref Range   WBC 17.7 (H) 4.0 - 10.5 K/uL   RBC 5.08 4.22 - 5.81 MIL/uL   Hemoglobin 14.4 13.0 - 17.0 g/dL   HCT 42.0 39.0 - 52.0 %   MCV 82.7 78.0 - 100.0 fL   MCH 28.3 26.0 - 34.0 pg   MCHC 34.3 30.0 - 36.0 g/dL   RDW 12.7 11.5 - 15.5 %   Platelets 152 150 - 400 K/uL  Blood gas, arterial     Status: Abnormal   Collection Time: 06/11/16  5:30 PM  Result Value Ref Range   FIO2 1.00    Delivery systems NON-REBREATHER OXYGEN MASK    pH, Arterial 7.402 7.350 - 7.450   pCO2 arterial 37.9 35.0 - 45.0 mmHg   pO2, Arterial 46.9 (L) 80.0 - 100.0 mmHg   Bicarbonate 23.1 20.0 - 24.0 mEq/L   TCO2 20.3 0 - 100 mmol/L   Acid-base deficit 0.8 0.0 - 2.0 mmol/L   O2 Saturation 79.5 %   Patient temperature 98.6    Collection site RIGHT RADIAL    Drawn by [id number]    Sample type ARTERIAL DRAW    Allens test (pass/fail) PASS PASS     IMAGING: Ct Angio Chest Pe W Or Wo Contrast  Result Date: 06/11/2016 CLINICAL DATA:  Lethargy, hypoxia ; status post LEFT axillary abscess incision and drainage. History hypertension, hyperlipidemia, uncontrolled diabetes. EXAM: CT ANGIOGRAPHY CHEST WITH CONTRAST TECHNIQUE: Multidetector CT imaging of the chest was performed using the standard protocol during bolus administration of intravenous contrast. Multiplanar CT image reconstructions and MIPs were obtained to evaluate the vascular anatomy. CONTRAST:  100 cc Isovue 370 COMPARISON:  Chest radiographs August 24th 1719 hours FINDINGS: PULMONARY ARTERY: Adequate contrast opacification of the pulmonary artery's. Main pulmonary artery is not enlarged. No pulmonary arterial filling defects to the level of the subsegmental branches. MEDIASTINUM: Heart size is mildly enlarged. No RIGHT heart strain. Scattered coronary artery calcifications. No pericardial effusion. Thoracic aorta is normal course and caliber, trace calcific atherosclerosis at the aortic arch. LUNGS: Tracheobronchial tree is patent, no pneumothorax. Dense hypo enhancing consolidation in the lower lobes bilaterally air bronchograms. SOFT TISSUES AND OSSEOUS STRUCTURES: Included view of the abdomen is unremarkable. LEFT axilla skin defect with small surrounding lymph nodes compatible with history of abscess drainage, no focal fluid collection. IMPRESSION: No acute pulmonary embolism. Bilateral lower lobe pneumonia. Status post LEFT axillary incision and drainage, no focal fluid collection. Electronically Signed   By: Elon Alas M.D.   On: 06/11/2016 18:48   Dg Chest Port 1 View  Result Date: 06/11/2016 CLINICAL DATA:  Hypoxia, weakness today. History of hypertension, diabetes. EXAM: PORTABLE CHEST 1 VIEW COMPARISON:  Chest radiograph December 07, 2011 FINDINGS: The cardiac silhouette is mildly enlarged. Pulmonary vascular congestion and mild interstitial prominence without pleural  effusion or focal consolidation. No pneumothorax tiny scattered granulomata. Soft tissue planes included osseous structures are nonsuspicious, high-riding RIGHT humeral head suggests old rotator cuff injury. IMPRESSION: Mild cardiomegaly. Pulmonary vascular congestion. Interstitial prominence favoring pulmonary edema, less likely atypical pneumonia. Electronically Signed   By: Elon Alas M.D.   On: 06/11/2016 17:47    HOSPITAL DIAGNOSES: Principal Problem:   Abscess of axilla, left Active Problems:   Type 2 diabetes mellitus, uncontrolled ([place])   Essential hypertension   Pure hypercholesterolemia   Acute respiratory failure with hypoxia (HCC)   NSTEMI (non-ST elevated myocardial infarction) ([place])   IMPRESSION: 1. Acute respiratory failure with hypoxia 2. Probable periprocedural NSTEMI 3.  Uncontrolled type 2 diabetes 4. Dyslipidemia 5. Hypertension 6. Status post left axillary abscess drainage  RECOMMENDATION: 1. Mr. Warehime likely had a periprocedural non-STEMI and has evidence of new Q waves inferiorly compared to a prior EKG in 2013. There is lateral ST segment depression which is less than 1 mm. Initial troponin was elevated at close to 6. We will obtain serial cardiac enzymes and continue IV heparin. Lasix was administered for probable acute systolic congestive heart failure secondary to MI. There also appears to have been an aspiration event with by basilar pneumonitis versus pneumonia on CT. His CT chest was negative for pulmonary embolism however there are multivessel coronary artery calcifications. He does not appear to be currently on aspirin. I would load 324 mg by mouth 1 now and then continue 81 mg aspirin daily. Start Lipitor 80 mg daily at bedtime. Check a 2-D echocardiogram tomorrow and BNP with labs in the am. If he shows significant respiratory improvement, he may be candidate for cardiac catheterization later tomorrow - please keep NPO p MN (which he should be if he  remains on bipap).  Thanks for the consultation. Cardiology will follow closely with you. Dr. Meda Coffee will assume care tomorrow on rounds.  CRITICAL CARE:  The patient is critically ill with multi-organ system failure and requires high complexity decision making for assessment and support, frequent evaluation and titration of therapies, application of advanced monitoring technologies and extensive interpretation of multiple databases.  Time Spent Directly with Patient: 45 minutes  Pixie Casino, MD, [facility] Attending Cardiologist [place] 06/11/2016, 8:10 PM

## 2016-06-11 NOTE — Significant Event (Signed)
Rapid Response Event Note  Overview:      Initial Focused Assessment:   Interventions:  Plan of Care (if not transferred):  Event Summary:RRT RN called to 1330 for desating. Upon arrival pt supine in bed, alert and mentating well. Family at bedside. Lung sounds equal bilaterally. Does not feel distressed, although increased Work of breathing noted. Sats 67-76% on NRB. Apparently he had an I&D 8/23 in OR last pm. He has had high blood sugars today, making him feel miserable and multi events of vomitting. ? Aspiration. Dr Renne Crigler at bedside. Stat PCXR and ABG's ordered. PCCM called for Consult. Name of Physician Notified: Dr Renne Crigler at 1650    at    Outcome: Transferred (Comment)  Event End Time: [address] [place]

## 2016-06-11 NOTE — Progress Notes (Signed)
Inpatient Diabetes Program Recommendations  AACE/ADA: New Consensus Statement on Inpatient Glycemic Control (2015)  Target Ranges:  Prepandial:   less than 140 mg/dL      Peak postprandial:   less than 180 mg/dL (1-2 hours)      Critically ill patients:  140 - 180 mg/dL   Lab Results  Component Value Date   GLUCAP 349 (H) 06/11/2016   HGBA1C 12.2 (H) 06/10/2016   Results for [name], [name] (MRN [id number]) as of 06/11/2016 15:20  Ref. Range 06/11/2016 02:19 06/11/2016 06:11 06/11/2016 12:10  Glucose-Capillary Latest Ref Range: 65 - 99 mg/dL 311 (H) 425 (H) 410 (H)   Review of Glycemic Control  Diabetes history: DM2 Outpatient Diabetes medications: None - previously on metformin 1000 mg bid, glipizide 10 mg bid Current orders for Inpatient glycemic control: Lantus 10 units QD, Novolog moderate tidwc and hs  Inpatient Diabetes Program Recommendations:    Increase Lantus to 15 units QD Increase Novolog to resistant tidwc and hs When po intake increases, will likely need meal coverage insulin - Novolog 4 units tidwc  Ordered Living Well with Diabetes book, DM videos, OP Diabetes education consult and insulin pen starter kit.  Spoke with pt regarding his HgbA1C of 12.2% and importance of controlling blood sugars for healing and to prevent long and short-term complications. Pt states he has meter at home and checks blood sugars 1-2x/week. States blood sugars have never been this high before. 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 Discussed how diet, exercise and stress management play a role in controlling blood sugars. Also discussed smoking cessation. Discussed above with MD and RN. Pt will need much encouragement. Is unable to participate in diabetes education as pt feels "nauseated and in a lot of pain."  Diabetes Coordinator to f/u in am.  Thank you. Lorenda Peck, RD, LDN,  CDE Inpatient Diabetes Coordinator [phone]

## 2016-06-12 ENCOUNTER — Encounter (HOSPITAL_COMMUNITY)
Admission: AD | Disposition: A | Discharge status: Home / Self Care | Payer: Self-pay | Source: Ambulatory Visit | Attending: Internal Medicine

## 2016-06-12 ENCOUNTER — Inpatient Hospital Stay (HOSPITAL_COMMUNITY): Payer: BLUE CROSS/BLUE SHIELD

## 2016-06-12 DIAGNOSIS — R0902 Hypoxemia: Secondary | ICD-10-CM

## 2016-06-12 DIAGNOSIS — Z720 Tobacco use: Secondary | ICD-10-CM

## 2016-06-12 DIAGNOSIS — I214 Non-ST elevation (NSTEMI) myocardial infarction: Principal | ICD-10-CM

## 2016-06-12 DIAGNOSIS — R7989 Other specified abnormal findings of blood chemistry: Secondary | ICD-10-CM

## 2016-06-12 LAB — BLOOD GAS, ARTERIAL
ACID-BASE EXCESS: 1.5 mmol/L (ref 0.0–2.0)
Bicarbonate: 25.2 mEq/L — ABNORMAL HIGH (ref 20.0–24.0)
DRAWN BY: 257701
FIO2: 1
O2 SAT: 95.5 %
PATIENT TEMPERATURE: 37
TCO2: 21.9 mmol/L (ref 0–100)
pCO2 arterial: 38.4 mmHg (ref 35.0–45.0)
pH, Arterial: 7.433 (ref 7.350–7.450)
pO2, Arterial: 81.5 mmHg (ref 80.0–100.0)

## 2016-06-12 LAB — BRAIN NATRIURETIC PEPTIDE: B NATRIURETIC PEPTIDE 5: 559.6 pg/mL — AB (ref 0.0–100.0)

## 2016-06-12 LAB — GLUCOSE, CAPILLARY
GLUCOSE-CAPILLARY: 405 mg/dL — AB (ref 65–99)
Glucose-Capillary: 248 mg/dL — ABNORMAL HIGH (ref 65–99)
Glucose-Capillary: 238 mg/dL — ABNORMAL HIGH (ref 65–99)
Glucose-Capillary: 237 mg/dL — ABNORMAL HIGH (ref 65–99)
Glucose-Capillary: 183 mg/dL — ABNORMAL HIGH (ref 65–99)

## 2016-06-12 LAB — BASIC METABOLIC PANEL
ANION GAP: 11 (ref 5–15)
BUN: 30 mg/dL — ABNORMAL HIGH (ref 6–20)
CO2: 27 mmol/L (ref 22–32)
Calcium: 8.4 mg/dL — ABNORMAL LOW (ref 8.9–10.3)
Chloride: 96 mmol/L — ABNORMAL LOW (ref 101–111)
Creatinine, Ser: 1.19 mg/dL (ref 0.61–1.24)
GFR calc Af Amer: >60 mL/min (ref >60)
Glucose, Bld: 186 mg/dL — ABNORMAL HIGH (ref 65–99)
POTASSIUM: 3.8 mmol/L (ref 3.5–5.1)
SODIUM: 134 mmol/L — AB (ref 135–145)

## 2016-06-12 LAB — CBC
HCT: 42.2 % (ref 39.0–52.0)
Hemoglobin: 14.5 g/dL (ref 13.0–17.0)
MCH: 28.4 pg (ref 26.0–34.0)
MCHC: 34.4 g/dL (ref 30.0–36.0)
MCV: 82.7 fL (ref 78.0–100.0)
PLATELETS: 141 10*3/uL — AB (ref 150–400)
RBC: 5.1 MIL/uL (ref 4.22–5.81)
RDW: 12.7 % (ref 11.5–15.5)
WBC: 18.8 10*3/uL — AB (ref 4.0–10.5)

## 2016-06-12 LAB — TROPONIN I
Troponin I: 16.06 ng/mL (ref <0.03)
Troponin I: 10.36 ng/mL (ref <0.03)

## 2016-06-12 LAB — PROCALCITONIN: Procalcitonin: 0.89 ng/mL

## 2016-06-12 LAB — HEPARIN LEVEL (UNFRACTIONATED)
HEPARIN UNFRACTIONATED: 0.16 [IU]/mL — AB (ref 0.30–0.70)
Heparin Unfractionated: <0.1 IU/mL — ABNORMAL LOW (ref 0.30–0.70)
Heparin Unfractionated: <0.1 IU/mL — ABNORMAL LOW (ref 0.30–0.70)

## 2016-06-12 LAB — ECHOCARDIOGRAM COMPLETE
Height: 66 in
Weight: 2585.55 oz

## 2016-06-12 SURGERY — LEFT HEART CATH AND CORONARY ANGIOGRAPHY

## 2016-06-12 MED ORDER — VANCOMYCIN HCL IN DEXTROSE 1-5 GM/200ML-% IV SOLN
1000.0000 mg | Freq: Two times a day (BID) | INTRAVENOUS | Status: DC
  Administered 2016-06-12 – 2016-06-14 (×5): 1000 mg via INTRAVENOUS
  Filled 2016-06-12 (×7): qty 200

## 2016-06-12 MED ORDER — INSULIN ASPART 100 UNIT/ML ~~LOC~~ SOLN
0.0000 [IU] | SUBCUTANEOUS | Status: DC
  Administered 2016-06-12 – 2016-06-13 (×3): 3 [IU] via SUBCUTANEOUS
  Administered 2016-06-13 (×3): 2 [IU] via SUBCUTANEOUS
  Administered 2016-06-14 (×2): 3 [IU] via SUBCUTANEOUS
  Administered 2016-06-14: 2 [IU] via SUBCUTANEOUS
  Administered 2016-06-14: 5 [IU] via SUBCUTANEOUS
  Administered 2016-06-14: 1 [IU] via SUBCUTANEOUS
  Administered 2016-06-15 (×2): 2 [IU] via SUBCUTANEOUS
  Administered 2016-06-15 (×3): 3 [IU] via SUBCUTANEOUS
  Administered 2016-06-16: 1 [IU] via SUBCUTANEOUS
  Administered 2016-06-16: 2 [IU] via SUBCUTANEOUS
  Administered 2016-06-16: 3 [IU] via SUBCUTANEOUS
  Administered 2016-06-16: 1 [IU] via SUBCUTANEOUS

## 2016-06-12 MED ORDER — ALBUTEROL SULFATE (2.5 MG/3ML) 0.083% IN NEBU: 2.5000 mg | INHALATION_SOLUTION | RESPIRATORY_TRACT | Status: DC | PRN

## 2016-06-12 MED ORDER — SODIUM CHLORIDE 0.9% FLUSH: 3.0000 mL | INTRAVENOUS | Status: DC | PRN

## 2016-06-12 MED ORDER — SODIUM CHLORIDE 0.9% FLUSH
3.0000 mL | Freq: Two times a day (BID) | INTRAVENOUS | Status: DC
  Administered 2016-06-12 – 2016-06-14 (×3): 3 mL via INTRAVENOUS

## 2016-06-12 MED ORDER — FUROSEMIDE 10 MG/ML IJ SOLN
40.0000 mg | Freq: Once | INTRAMUSCULAR | Status: AC
  Administered 2016-06-12: 40 mg via INTRAVENOUS
  Filled 2016-06-12: qty 4

## 2016-06-12 MED ORDER — HEPARIN (PORCINE) IN NACL 100-0.45 UNIT/ML-% IJ SOLN
1100.0000 [IU]/h | INTRAMUSCULAR | Status: DC
  Filled 2016-06-12 (×2): qty 250

## 2016-06-12 MED ORDER — METOPROLOL TARTRATE 12.5 MG HALF TABLET
12.5000 mg | ORAL_TABLET | Freq: Two times a day (BID) | ORAL | Status: DC
  Administered 2016-06-12 (×2): 12.5 mg via ORAL
  Filled 2016-06-12 (×2): qty 1

## 2016-06-12 MED ORDER — HEPARIN BOLUS VIA INFUSION
2000.0000 [IU] | Freq: Once | INTRAVENOUS | Status: AC
  Administered 2016-06-12: 2000 [IU] via INTRAVENOUS
  Filled 2016-06-12: qty 2000

## 2016-06-12 MED ORDER — SODIUM CHLORIDE 0.9 % IV SOLN
INTRAVENOUS | Status: DC
  Administered 2016-06-14: 07:00:00 via INTRAVENOUS

## 2016-06-12 MED ORDER — CHLORHEXIDINE GLUCONATE 0.12 % MT SOLN
15.0000 mL | Freq: Two times a day (BID) | OROMUCOSAL | Status: DC
  Administered 2016-06-12 – 2016-06-15 (×5): 15 mL via OROMUCOSAL
  Filled 2016-06-12 (×5): qty 15

## 2016-06-12 MED ORDER — FUROSEMIDE 10 MG/ML IJ SOLN: 20.0000 mg | Freq: Once | INTRAMUSCULAR | Status: DC

## 2016-06-12 MED ORDER — HEPARIN (PORCINE) IN NACL 100-0.45 UNIT/ML-% IJ SOLN
1700.0000 [IU]/h | INTRAMUSCULAR | Status: DC
  Administered 2016-06-12: 1350 [IU]/h via INTRAVENOUS
  Administered 2016-06-13: 1550 [IU]/h via INTRAVENOUS
  Administered 2016-06-14 – 2016-06-16 (×3): 1700 [IU]/h via INTRAVENOUS
  Filled 2016-06-12 (×7): qty 250

## 2016-06-12 MED ORDER — ASPIRIN 81 MG PO CHEW: 81.0000 mg | CHEWABLE_TABLET | ORAL | Status: AC

## 2016-06-12 MED ORDER — NITROGLYCERIN 0.4 MG SL SUBL
0.4000 mg | SUBLINGUAL_TABLET | SUBLINGUAL | Status: DC | PRN
  Administered 2016-06-12: 0.4 mg via SUBLINGUAL
  Filled 2016-06-12: qty 1

## 2016-06-12 MED ORDER — ORAL CARE MOUTH RINSE
15.0000 mL | Freq: Two times a day (BID) | OROMUCOSAL | Status: DC
  Administered 2016-06-12 – 2016-06-14 (×3): 15 mL via OROMUCOSAL

## 2016-06-12 MED ORDER — SODIUM CHLORIDE 0.9 % IV SOLN: 250.0000 mL | INTRAVENOUS | Status: DC | PRN

## 2016-06-12 NOTE — Progress Notes (Signed)
Interventional Cardiology Note:   [name] was transferred to [facility] ICU today from [facility] hospital for further monitoring and consideration for cardiac cath. He was admitted after I&D of abscess 06/10/16 with subsequent lethargy and hypoxic respiratory failure. He denies any chest pain over the last 48 hours. His wife confirms this. He was diaphoretic. Troponin peaked at 16 and now down to 10. He has had continued respiratory distress today requiring bipap. He has been found to have bilateral pneumonia, likely aspiration pneumonia per PCCM team notes. He is receiving Zosyn now. WBC is elevated at 18.8. He is felt to be volume overloaded and is receiving IV Lasix.   I have met with him this afternoon along with his wife. He will need to have his pulmonary status optimized before proceeding with cardiac cath. I would recommend continued diuresis, continue IV antibiotics and when he is able to be weaned from bipap, can consider cath. The fact that his troponin is now trending down is reassuring. Would continue IV heparin.   Lauree Chandler 06/12/2016  4:14 PM

## 2016-06-12 NOTE — Progress Notes (Signed)
2 Days Post-Op  Subjective: Events noted Peri-op MI Now on heparin Still some SOB  Objective: Vital signs in last 24 hours: Temp:  [98 F (36.7 C)-99.5 F (37.5 C)] 99.5 F (37.5 C) (08/25 0400) Pulse Rate:  [95-108] 105 (08/25 0600) Resp:  [18-32] 22 (08/25 0600) BP: (102-132)/(55-114) 109/78 (08/25 0600) SpO2:  [92 %-100 %] 96 % (08/25 0600) FiO2 (%):  [60 %-85 %] 60 % (08/24 2035) Last BM Date: 06/10/16  Intake/Output from previous day: 08/24 0701 - 08/25 0700 In: 1704.8 [I.V.:994.8; IV Piggyback:600] Out: 1300 [Urine:1300] Intake/Output this shift: No intake/output data recorded.  Packing removed from axilla.  Wound clean, induration less  Lab Results:   Recent Labs  06/11/16 1723 06/12/16 0152  WBC 17.7* 18.8*  HGB 14.4 14.5  HCT 42.0 42.2  PLT 152 141*   BMET  Recent Labs  06/11/16 1723 06/12/16 0152  NA 130* 134*  K 3.6 3.8  CL 92* 96*  CO2 26 27  GLUCOSE 354* 186*  BUN 28* 30*  CREATININE 1.37* 1.19  CALCIUM 8.2* 8.4*   PT/INR  Recent Labs  06/10/16 1845  LABPROT 13.2  INR 1.00   ABG  Recent Labs  06/11/16 1730 06/11/16 2030  PHART 7.402 7.451*  HCO3 23.1 24.7*    Studies/Results: Ct Angio Chest Pe W Or Wo Contrast  Result Date: 06/11/2016 CLINICAL DATA:  Lethargy, hypoxia ; status post LEFT axillary abscess incision and drainage. History hypertension, hyperlipidemia, uncontrolled diabetes. EXAM: CT ANGIOGRAPHY CHEST WITH CONTRAST TECHNIQUE: Multidetector CT imaging of the chest was performed using the standard protocol during bolus administration of intravenous contrast. Multiplanar CT image reconstructions and MIPs were obtained to evaluate the vascular anatomy. CONTRAST:  100 cc Isovue 370 COMPARISON:  Chest radiographs August 24th 1719 hours FINDINGS: PULMONARY ARTERY: Adequate contrast opacification of the pulmonary artery's. Main pulmonary artery is not enlarged. No pulmonary arterial filling defects to the level of the  subsegmental branches. MEDIASTINUM: Heart size is mildly enlarged. No RIGHT heart strain. Scattered coronary artery calcifications. No pericardial effusion. Thoracic aorta is normal course and caliber, trace calcific atherosclerosis at the aortic arch. LUNGS: Tracheobronchial tree is patent, no pneumothorax. Dense hypo enhancing consolidation in the lower lobes bilaterally air bronchograms. SOFT TISSUES AND OSSEOUS STRUCTURES: Included view of the abdomen is unremarkable. LEFT axilla skin defect with small surrounding lymph nodes compatible with history of abscess drainage, no focal fluid collection. IMPRESSION: No acute pulmonary embolism. Bilateral lower lobe pneumonia. Status post LEFT axillary incision and drainage, no focal fluid collection. Electronically Signed   By: Elon Alas M.D.   On: 06/11/2016 18:48   Dg Chest Port 1 View  Result Date: 06/11/2016 CLINICAL DATA:  Hypoxia, weakness today. History of hypertension, diabetes. EXAM: PORTABLE CHEST 1 VIEW COMPARISON:  Chest radiograph December 07, 2011 FINDINGS: The cardiac silhouette is mildly enlarged. Pulmonary vascular congestion and mild interstitial prominence without pleural effusion or focal consolidation. No pneumothorax tiny scattered granulomata. Soft tissue planes included osseous structures are nonsuspicious, high-riding RIGHT humeral head suggests old rotator cuff injury. IMPRESSION: Mild cardiomegaly. Pulmonary vascular congestion. Interstitial prominence favoring pulmonary edema, less likely atypical pneumonia. Electronically Signed   By: Elon Alas M.D.   On: 06/11/2016 17:47    Anti-infectives: Anti-infectives    Start     Dose/Rate Route Frequency Ordered Stop   06/11/16 1400  piperacillin-tazobactam (ZOSYN) IVPB 3.375 g  Status:  Discontinued     3.375 g 100 mL/hr over 30 Minutes Intravenous Every 8 hours  06/11/16 1246 06/11/16 1247   06/11/16 1400  piperacillin-tazobactam (ZOSYN) IVPB 3.375 g     3.375 g 12.5  mL/hr over 240 Minutes Intravenous Every 8 hours 06/11/16 1249     06/11/16 1330  vancomycin (VANCOCIN) IVPB 750 mg/150 ml premix     750 mg 150 mL/hr over 60 Minutes Intravenous Every 8 hours 06/11/16 1305     06/10/16 1900  piperacillin-tazobactam (ZOSYN) IVPB 3.375 g  Status:  Discontinued     3.375 g 12.5 mL/hr over 240 Minutes Intravenous Every 8 hours 06/10/16 1805 06/10/16 2207      Assessment/Plan: s/p Procedure(s): INCISION AND DRAINAGE LEFT AXILLARY ABSCESS (Left)  Wound care IV antibiotics Plans per Cards  LOS: 2 days    [name] A 06/12/2016

## 2016-06-12 NOTE — Care Management Note (Signed)
Case Management Note  Patient Details  Name: [name] MRN: [id number] Date of Birth: 03/28/1961  Subjective/Objective:         Adm w axilla I&d, mi           Action/Plan: lives w wife   Expected Discharge Date:                  Expected Discharge Plan:  [place]  In-House Referral:     Discharge planning Services  CM Consult  Post Acute Care Choice:  Home Health Choice offered to:  Spouse  DME Arranged:    DME Agency:     HH Arranged:  RN [place] Agency:  [place]  Status of Service:     If discussed at [place] of Stay Meetings, dates discussed:    Additional Comments: spoke w wife and went over hhc agency list. [facility] chose [facility]. Ref to donna w [facility] for hhrn for dssg changes at disch.  Lacretia Leigh, RN 06/12/2016, 2:32 PM

## 2016-06-12 NOTE — Progress Notes (Signed)
Patient is tolerating 6L nasal cannula fairly well for now. RR is 17 and HR 113, patient is not in active respiratory distress and wants to eat. BiPAP not placed on at this time and RT will continue to monitor throughout the night.

## 2016-06-12 NOTE — Progress Notes (Signed)
CRITICAL VALUE ALERT  Critical value received:  Troponin 16.06  Date of notification:  06/12/2016  Time of notification:  0015  Critical value read back:Yes.    Nurse who received alert:  Odis Hollingshead RN   MD notified (1st page):  Dr. Marlowe Sax Cards Fellow   Time of first page:  0025  MD notified (2nd page):  Time of second page:  Responding MD:  Dr. Marlowe Sax   Time MD responded:  [phone]

## 2016-06-12 NOTE — Anesthesia Postprocedure Evaluation (Signed)
Anesthesia Post Note  Patient: [name]  Procedure(s) Performed: Procedure(s) (LRB): INCISION AND DRAINAGE LEFT AXILLARY ABSCESS (Left)  Patient location during evaluation: PACU Anesthesia Type: General Level of consciousness: awake and alert Pain management: pain level controlled Vital Signs Assessment: post-procedure vital signs reviewed and stable Respiratory status: spontaneous breathing, nonlabored ventilation and respiratory function stable Cardiovascular status: blood pressure returned to baseline and stable Postop Assessment: no signs of nausea or vomiting Anesthetic complications: no    Last Vitals:  Vitals:   06/12/16 0500 06/12/16 0600  BP: 117/70 109/78  Pulse: 97 (!) 105  Resp:  (!) 22  Temp:      Last Pain:  Vitals:   06/12/16 0400  TempSrc: Axillary  PainSc: 3                  Rashed Edler,W. EDMOND

## 2016-06-12 NOTE — Progress Notes (Signed)
Pharmacy Antibiotic Note  [name] is a 55 y.o. male admitted on 06/10/2016 with wound infection s/p I&D left axillary abscess 8/23, now possible aspiration PNA vomiting post I&D.  Patient being transferred to [facility] for [facility] 8/25 for NSTEMI.  Pharmacy has been following for vancomycin dosing.  SCr bumped yesterday afternoon, which is improving today but would still like to empirically adjust the vancomycin dose from q8h dosing.  Plan: Adjust vancomycin to 1g q12h. No dose adjustment to Zosyn. Continue Zosyn 3.375g IV q8h (4 hour infusion time).  Follow renal function, cultures, clinical course.  Height: 5\' 6"  (167.6 cm) Weight: 161 lb 9.6 oz (73.3 kg) IBW/kg (Calculated) : 63.8  Temp (24hrs), Avg:99 F (37.2 C), Min:98 F (36.7 C), Max:99.7 F (37.6 C)   Recent Labs Lab 06/10/16 1845 06/11/16 1723 06/12/16 0152  WBC 11.3* 17.7* 18.8*  CREATININE 0.76 1.37* 1.19    Estimated Creatinine Clearance: 63.3 mL/min (by C-G formula based on SCr of 1.19 mg/dL).    No Known Allergies  Antimicrobials this admission: 8/24 vanc >> 8/24 zosyn >>  Dose adjustments this admission: 8/25 adj vanc 750mg  q8h to 1g q12h for incr SCr  Microbiology results: 8/24 left axilla cx: IP; gram stain showing GPC 8/24 MRSA PCR: negative  Thank you for allowing pharmacy to be a part of this patient's care.  Hershal Coria, PharmD, BCPS Pager: [phone] 06/12/2016 12:42 PM

## 2016-06-12 NOTE — Progress Notes (Addendum)
ANTICOAGULATION CONSULT NOTE - f/u Consult  Pharmacy Consult for Heparin Indication: chest pain/ACS  No Known Allergies  Patient Measurements: Height: 5\' 6"  (167.6 cm) Weight: 161 lb 6 oz (73.2 kg) IBW/kg (Calculated) : 63.8 Heparin Dosing Weight: 73 kg  Vital Signs: Temp: 99.1 F (37.3 C) (08/25 1942) Temp Source: Oral (08/25 1942) BP: 114/91 (08/25 1942) Pulse Rate: 107 (08/25 1942)  Labs:  Recent Labs  06/10/16 1845 06/11/16 1723 06/11/16 2320 06/12/16 0152 06/12/16 0721 06/12/16 1131 06/12/16 1900  HGB 13.5 14.4  --  14.5  --   --   --   HCT 40.9 42.0  --  42.2  --   --   --   PLT 125* 152  --  141*  --   --   --   LABPROT 13.2  --   --   --   --   --   --   INR 1.00  --   --   --   --   --   --   HEPARINUNFRC  --   --   --  <0.10*  --  <0.10* 0.16*  CREATININE 0.76 1.37*  --  1.19  --   --   --   TROPONINI  --  5.90* 16.06*  --  10.36*  --   --     Estimated Creatinine Clearance: 63.3 mL/min (by C-G formula based on SCr of 1.19 mg/dL).   Medical History: Past Medical History:  Diagnosis Date  . Abrasion L FOREARM  . Diabetes mellitus without complication ([place])   . Headache(784.0)   . Hematuria, microscopic "SINCE I WAS A KID"  . Hypertension   . Lactose intolerance   . Rotator cuff tear RIGHT    Medications:  Scheduled:  . aspirin  81 mg Oral Daily  . [START ON 06/13/2016] aspirin  81 mg Oral Pre-Cath  . atorvastatin  80 mg Oral q1800  . buPROPion  150 mg Oral Daily  . chlorhexidine  15 mL Mouth Rinse BID  . insulin aspart  0-9 Units Subcutaneous Q4H  . insulin glargine  10 Units Subcutaneous Daily  . mouth rinse  15 mL Mouth Rinse q12n4p  . metoCLOPramide (REGLAN) injection  5 mg Intravenous Q8H  . metoprolol tartrate  12.5 mg Oral BID  . nicotine  7 mg Transdermal Daily  . piperacillin-tazobactam (ZOSYN)  IV  3.375 g Intravenous Q8H  . sodium chloride flush  3 mL Intravenous Q12H  . vancomycin  1,000 mg Intravenous Q12H   Infusions:  .  sodium chloride Stopped (06/12/16 1437)  . heparin 1,350 Units/hr (06/12/16 1555)  . lactated ringers 30 mL/hr at 06/12/16 1328   Assessment: 55yoM w/ PMH uncontrolled DM2, HTN, HLD. Admitted on 8/23 for I&D of left axillary abscess. Transferred to ICU 8/24 for sudden onset hypoxia, intubation, concern for aspiration.  He is now noted with NSTEMI on heparin andlevel is below goal on 1350 units/hr -HL= 0.16  Goal of Therapy:  Heparin level 0.3-0.7 units/ml Monitor platelets by anticoagulation protocol: Yes   Plan: -Heparin bolus 2000 units IV followed by increase to 1350 units/hr  -Heparin level in 6 hours and daily wth CBC daily  Hildred Laser, Pharm D 06/12/2016 8:44 PM

## 2016-06-12 NOTE — Progress Notes (Signed)
Patient placed back on BiPAP by RN. Patient is tolerating it well and RT will continue to monitor.

## 2016-06-12 NOTE — Progress Notes (Signed)
PULMONARY / CRITICAL CARE MEDICINE   Name: WAHID FRIDMAN MRN: [id number] DOB: 1961/10/02    ADMISSION DATE:  06/10/2016 CONSULTATION DATE:  06/11/16  REFERRING MD:  CCS  CHIEF COMPLAINT:  Acute onset dyspnea, hypoxia.  HISTORY OF PRESENT ILLNESS:   Mr. Congleton is a 55 year old with history of uncontrolled type 2 diabetes, hypertension, hyperlipidemia. He was admitted on 8/23 for incision and drainage of left axillary abscess. Postop course complicated by hyperglycemia for which when TRH was consulted. Today about 4:30 patient was noted to be more lethargic, sleepy, hypoxic. ABG shows 7.40/38/47/79%. He was placed on BiPAP and transferred to stepdown. PCCM consulted to assess need for intubation.  After he was placed on BiPAP the respiratory status has improved. He has normal work of breathing with sats in the 90s. He is awake and alert, oriented and answers questions appropriately. He is an active smoker, up to one half packs per day. He's never been told he says he has COPD but has symptoms of chronic cough with mucus production. He also has snoring at night but no sleep study done.   SUBJECTIVE:  RN reports pt wore bipap overnight ~2-3 hours, did not tolerate well (pulling at mask), received lasix overnight with 1.3L out.  Inferior EKG changes + elevated troponin.  O2 weaned to partial NRB  VITAL SIGNS: BP 109/78   Pulse (!) 105   Temp 99.5 F (37.5 C) (Axillary)   Resp (!) 22   Ht 5\' 6"  (1.676 m)   Wt 161 lb 9.6 oz (73.3 kg)   SpO2 96%   BMI 26.08 kg/m   HEMODYNAMICS:    VENTILATOR SETTINGS: Vent Mode: Other (Comment) FiO2 (%):  [60 %-85 %] 60 % Set Rate:  [12 bmp] 12 bmp PEEP:  [6 cmH20-8 cmH20] 6 cmH20  INTAKE / OUTPUT: I/O last 3 completed shifts: In: 2793.8 [I.V.:2083.8; Other:110; IV [id number] Out: 2110 [Urine:2100; Blood:10]  PHYSICAL EXAMINATION: General: white male lying in bed, no acute distress   Neuro:  Moves all 4 extremities, no focal  deficits HEENT: MM pink/moist, poor dentition, no thyromegaly, JVD Cardiovascular:  Regular rate and rhythm, no murmurs rubs gallops Lungs:  Even/non-labored, lungs bilaterally with basilar crackles, no wheezing Abdomen:  Soft, positive bowel sounds, nontender, nondistended Musculoskeletal:  Normal tone and bulk. No edema Skin:  No rashes, L axilla surgical incisions c/d/i, foul odor from site  LABS:  BMET  Recent Labs Lab 06/10/16 1845 06/11/16 1723 06/12/16 0152  NA 137 130* 134*  K 3.4* 3.6 3.8  CL 101 92* 96*  CO2 28 26 27   BUN 15 28* 30*  CREATININE 0.76 1.37* 1.19  GLUCOSE 215* 354* 186*    Electrolytes  Recent Labs Lab 06/10/16 1845 06/11/16 1723 06/12/16 0152  CALCIUM 8.6* 8.2* 8.4*    CBC  Recent Labs Lab 06/10/16 1845 06/11/16 1723 06/12/16 0152  WBC 11.3* 17.7* 18.8*  HGB 13.5 14.4 14.5  HCT 40.9 42.0 42.2  PLT 125* 152 141*    Coag's  Recent Labs Lab 06/10/16 1845  INR 1.00    Sepsis Markers No results for input(s): LATICACIDVEN, PROCALCITON, O2SATVEN in the last 168 hours.  ABG  Recent Labs Lab 06/11/16 1730 06/11/16 2030  PHART 7.402 7.451*  PCO2ART 37.9 36.0  PO2ART 46.9* 120*    Liver Enzymes  Recent Labs Lab 06/10/16 1845 06/11/16 1723  AST 19 62*  ALT 25 31  ALKPHOS 87 84  BILITOT 0.7 1.1  ALBUMIN 3.8 3.5    Cardiac  Enzymes  Recent Labs Lab 06/11/16 1723 06/11/16 2320 06/12/16 0721  TROPONINI 5.90* 16.06* 10.36*    Glucose  Recent Labs Lab 06/11/16 0611 06/11/16 1210 06/11/16 1553 06/11/16 1642 06/11/16 1841 06/11/16 2131  GLUCAP 425* 410* 349* 342* 312* 241*    Imaging Ct Angio Chest Pe W Or Wo Contrast  Result Date: 06/11/2016 CLINICAL DATA:  Lethargy, hypoxia ; status post LEFT axillary abscess incision and drainage. History hypertension, hyperlipidemia, uncontrolled diabetes. EXAM: CT ANGIOGRAPHY CHEST WITH CONTRAST TECHNIQUE: Multidetector CT imaging of the chest was performed using  the standard protocol during bolus administration of intravenous contrast. Multiplanar CT image reconstructions and MIPs were obtained to evaluate the vascular anatomy. CONTRAST:  100 cc Isovue 370 COMPARISON:  Chest radiographs August 24th 1719 hours FINDINGS: PULMONARY ARTERY: Adequate contrast opacification of the pulmonary artery's. Main pulmonary artery is not enlarged. No pulmonary arterial filling defects to the level of the subsegmental branches. MEDIASTINUM: Heart size is mildly enlarged. No RIGHT heart strain. Scattered coronary artery calcifications. No pericardial effusion. Thoracic aorta is normal course and caliber, trace calcific atherosclerosis at the aortic arch. LUNGS: Tracheobronchial tree is patent, no pneumothorax. Dense hypo enhancing consolidation in the lower lobes bilaterally air bronchograms. SOFT TISSUES AND OSSEOUS STRUCTURES: Included view of the abdomen is unremarkable. LEFT axilla skin defect with small surrounding lymph nodes compatible with history of abscess drainage, no focal fluid collection. IMPRESSION: No acute pulmonary embolism. Bilateral lower lobe pneumonia. Status post LEFT axillary incision and drainage, no focal fluid collection. Electronically Signed   By: Elon Alas M.D.   On: 06/11/2016 18:48   Dg Chest Port 1 View  Result Date: 06/11/2016 CLINICAL DATA:  Hypoxia, weakness today. History of hypertension, diabetes. EXAM: PORTABLE CHEST 1 VIEW COMPARISON:  Chest radiograph December 07, 2011 FINDINGS: The cardiac silhouette is mildly enlarged. Pulmonary vascular congestion and mild interstitial prominence without pleural effusion or focal consolidation. No pneumothorax tiny scattered granulomata. Soft tissue planes included osseous structures are nonsuspicious, high-riding RIGHT humeral head suggests old rotator cuff injury. IMPRESSION: Mild cardiomegaly. Pulmonary vascular congestion. Interstitial prominence favoring pulmonary edema, less likely atypical  pneumonia. Electronically Signed   By: Elon Alas M.D.   On: 06/11/2016 17:47   STUDIES:  Chest X ray 8/24 >> Vascular congestion. Images reviewed. CTA Chest 8/24 >> no PE, bilateral lower lobe PNA, s/p L axillary incision & drainage, no fluid collection ECHO 8/25 >>   CULTURES: Abscess culture 8/23 >> GPCs >>  ANTIBIOTICS: Zosyn 8/23 >> Vanc 8/23 >>  SIGNIFICANT EVENTS: 6/23 - I and D of axillary abscess. 6/24 - PCCM consulted for respiratory distress  6/25 - elevated trop to 16 with new Q wave in inferior leads, pending tx to [facility] for LHC  LINES/TUBES:   DISCUSSION: 55 year old M with PMH DM (poor control with A1c >12) s/p I and D of left axillary abscess, GPC's in wound culture.  Vomiting post surgery.  Developed sudden onset hypoxia, with concern for aspiration.  Briefly required bipap overnight 8/25. He does not appear to have COPD exacerbation. CT chest with bibasilar PNA.     Acute Hypoxic Respiratory Failure - improved with O2, in the setting of NSTEMI + aspiration PNA , diuresed well with 20 mg IV lasix (1.3L)  Aspiration PNA - vomiting post I&D, LMA utilized during surgery   Suspected COPD - no baseline PFT's to confirm   Tobacco Abuse - 1.5 ppd x30 years  NSTEMI - inferior changes with new Q wave, troponin peak >16, caution  with fluid status given inferior involvement  Uncontrolled DM    Recommendations:  Wean O2 for sats > 90% PRN BiPAP if needed for increased work of breathing  Goal even to slightly positive balance  ABX as above  Trend PCT, narrow abx as able  Pulmonary hygiene - IS, mobilize as able  Consider PFT's at discharge  Intermittent CXR to follow bibasilar airspace disease  PRN albuterol for wheezing  No role steroids at this time Lasix per Cardiology  Continue heparin gtt per Cardiology > no PE on CTA [facility] for clear liquids until 10 am, then NPO  Need DM Coordinator education / suspect he will need to d/c on insulin  Cardiology  following Trend BMP  Agree with need for transfer to [facility] for pending LHC.  Will defer to [facility], likely will need TRH involvement upon transfer      Noe Gens, NP-C [place] Pgr: [phone] or if no answer [phone] 06/12/2016, 8:34 AM

## 2016-06-12 NOTE — Progress Notes (Signed)
At 0220, pt began complaining of left shoulder pain.  9/10 on pain scale and throbbing in nature. 2mg  morphine administered and EKG performed. Pt reported decrease to 6/10 pain.  Cardiology paged x2 with no response. Dr. Jimmy Footman paged and orders for sublingual nitro received and administered.  Pt pain decreased to 3/10.  Vitals signs remained stable throughout. Will continue to monitor.

## 2016-06-12 NOTE — Progress Notes (Addendum)
ANTICOAGULATION CONSULT NOTE - f/u Consult  Pharmacy Consult for Heparin Indication: chest pain/ACS  No Known Allergies  Patient Measurements: Height: 5\' 6"  (167.6 cm) Weight: 161 lb 9.6 oz (73.3 kg) IBW/kg (Calculated) : 63.8 Heparin Dosing Weight: 73 kg  Vital Signs: Temp: 99.7 F (37.6 C) (08/25 0800) Temp Source: Oral (08/25 0800) BP: 119/74 (08/25 0800) Pulse Rate: 111 (08/25 0800)  Labs:  Recent Labs  06/10/16 1845 06/11/16 1723 06/11/16 2320 06/12/16 0152 06/12/16 0721 06/12/16 1131  HGB 13.5 14.4  --  14.5  --   --   HCT 40.9 42.0  --  42.2  --   --   PLT 125* 152  --  141*  --   --   LABPROT 13.2  --   --   --   --   --   INR 1.00  --   --   --   --   --   HEPARINUNFRC  --   --   --  <0.10*  --  <0.10*  CREATININE 0.76 1.37*  --  1.19  --   --   TROPONINI  --  5.90* 16.06*  --  10.36*  --     Estimated Creatinine Clearance: 63.3 mL/min (by C-G formula based on SCr of 1.19 mg/dL).   Medical History: Past Medical History:  Diagnosis Date  . Abrasion L FOREARM  . Diabetes mellitus without complication ([name])   . Headache(784.0)   . Hematuria, microscopic "SINCE I WAS A KID"  . Hypertension   . Lactose intolerance   . Rotator cuff tear RIGHT    Medications:  Scheduled:  . aspirin  81 mg Oral Daily  . atorvastatin  80 mg Oral q1800  . buPROPion  150 mg Oral Daily  . insulin aspart  0-15 Units Subcutaneous TID WC  . insulin aspart  0-5 Units Subcutaneous QHS  . insulin glargine  10 Units Subcutaneous Daily  . metoCLOPramide (REGLAN) injection  5 mg Intravenous Q8H  . metoprolol tartrate  12.5 mg Oral BID  . nicotine  7 mg Transdermal Daily  . piperacillin-tazobactam (ZOSYN)  IV  3.375 g Intravenous Q8H  . vancomycin  750 mg Intravenous Q8H   Infusions:  . heparin 1,100 Units/hr (06/12/16 0800)  . lactated ringers 30 mL/hr at 06/12/16 1039   PRN: albuterol, morphine injection, nitroGLYCERIN, ondansetron (ZOFRAN) IV, oxyCODONE-acetaminophen,  promethazine  Assessment: 8yoM w/ PMH uncontrolled DM2, HTN, HLD. Admitted on 8/23 for I&D of left axillary abscess. Transferred to ICU 8/24 for sudden onset hypoxia, intubation, concern for aspiration.  On 8/25, troponins elevated - NSTEMI.  Pharmacy consults for abx and heparin gtt.  Pending tx to [facility] for LHC.  Today, 8/25:  HL remains undetectable despite increase in infusion rate to 1100 units/hr.   CBC stable  Goal of Therapy:  Heparin level 0.3-0.7 units/ml Monitor platelets by anticoagulation protocol: Yes   Plan:  Had placed order to increase heparin infusion to 1350 units/hr but found that patient currently en route to [facility] now for LHC so heparin rate not increased.  Will contact pharmacist at [facility] to follow up plan for heparin post-cath.  Hershal Coria 06/12/2016,12:36 PM

## 2016-06-12 NOTE — Progress Notes (Signed)
PROGRESS NOTE    [name]  [id number] DOB: 1961/01/16 DOA: 06/10/2016 PCP: Kennyth Arnold, FNP    Brief Narrative: [name] is a 55 year old male with history of diabetes mellitus, type II, hypertension, hyperlipidemia who was admitted for incision and drainage of left axilla abscess. Patient developed acute hypoxic respiratory failure and was found to have an NSTEMI, elevation in troponin with associated new Q waves in inferior leads initially without chest pain. Patient was started on heparin and cardiology consulted for management   Assessment & Plan:   Principal Problem:   Abscess of axilla, left Active Problems:   Type 2 diabetes mellitus, uncontrolled ([place])   Essential hypertension   Pure hypercholesterolemia   Acute respiratory failure with hypoxia (HCC)   NSTEMI (non-ST elevated myocardial infarction) (HCC)   Hypoxia   Smoking   NSTEMI Likely periprocedural. Patient with evidence of associated heart failure. Troponin continues to rise. Patient is asymptomatic today. Patient was given aspirin 324 mg yesterday and received nitroglycerin and morphine for pain -Continue heparin -Patient will likely need a cardiac catheterization today -Follow-up cardiology recommendations  Essential Hypertension Controlled -Lisinopril held on admission  Diabetes mellitus, type 2 Uncontrolled -Sliding-scale insulin -Continue Lantus 10 units subcutaneous  Hypercholesterolemia Patient on simvastatin. Will likely need to transition to atorvastatin.  Abscess of left axilla Status post I&D on 8/23  DVT prophylaxis: Heparin full dose  Code Status: Full code  Family Communication: Discussed with 2 daughters at bedside  Disposition Plan: Likely transferred to [facility] stepdown unit for cardiac catheterization   Consultants:   Cardiology  Pulmonology   Procedures:   Echocardiogram (8/25)  Antimicrobials:  None    Subjective: Patient reports no issues  overnight. He currently has no chest pain with improving dyspnea. He overall feels improved. He has some pain in his left axilla that is controlled with medication  Objective: Vitals:   06/12/16 0600 06/12/16 0700 06/12/16 0800 06/12/16 1115  BP: 109/78 120/76 119/74   Pulse: (!) 105 (!) 109 (!) 111   Resp: (!) 22 (!) 23 (!) 24   Temp:   99.7 F (37.6 C)   TempSrc:   Oral   SpO2: 96% 93% 98% 97%  Weight:      Height:        Intake/Output Summary (Last 24 hours) at 06/12/16 1249 Last data filed at 06/12/16 0800  Gross per 24 hour  Intake          1746.77 ml  Output             1300 ml  Net           446.77 ml   Filed Weights   06/10/16 1812  Weight: 73.3 kg (161 lb 9.6 oz)    Examination:  General exam: Appears calm and comfortable Respiratory system: Mild bibasilar crackles. Respiratory effort normal. Cardiovascular system: S1 & S2 heard, RRR. 2/6 midsystolic ejection murmur. No rubs, gallops or clicks. No pedal edema. Gastrointestinal system: Abdomen is nondistended, soft and nontender. No organomegaly or masses felt. Normal bowel sounds heard. Central nervous system: Alert and oriented. No focal neurological deficits. Extremities: Symmetric 5 x 5 power. Skin: No rashes, lesions or ulcers Psychiatry: Judgement and insight appear normal. Mood & affect appropriate.     Data Reviewed: I have personally reviewed following labs and imaging studies  CBC:  Recent Labs Lab 06/10/16 1845 06/11/16 1723 06/12/16 0152  WBC 11.3* 17.7* 18.8*  HGB 13.5 14.4 14.5  HCT 40.9  42.0 42.2  MCV 82.6 82.7 82.7  PLT 125* 152 Q000111Q*   Basic Metabolic Panel:  Recent Labs Lab 06/10/16 1845 06/11/16 1723 06/12/16 0152  NA 137 130* 134*  K 3.4* 3.6 3.8  CL 101 92* 96*  CO2 28 26 27   GLUCOSE 215* 354* 186*  BUN 15 28* 30*  CREATININE 0.76 1.37* 1.19  CALCIUM 8.6* 8.2* 8.4*   GFR: Estimated Creatinine Clearance: 63.3 mL/min (by C-G formula based on SCr of 1.19 mg/dL). Liver  Function Tests:  Recent Labs Lab 06/10/16 1845 06/11/16 1723  AST 19 62*  ALT 25 31  ALKPHOS 87 84  BILITOT 0.7 1.1  PROT 7.4 7.2  ALBUMIN 3.8 3.5   No results for input(s): LIPASE, AMYLASE in the last 168 hours. No results for input(s): AMMONIA in the last 168 hours. Coagulation Profile:  Recent Labs Lab 06/10/16 1845  INR 1.00   Cardiac Enzymes:  Recent Labs Lab 06/11/16 1723 06/11/16 2320 06/12/16 0721  TROPONINI 5.90* 16.06* 10.36*   BNP (last 3 results) No results for input(s): PROBNP in the last 8760 hours. HbA1C:  Recent Labs  06/10/16 1845  HGBA1C 12.2*   CBG:  Recent Labs Lab 06/11/16 1642 06/11/16 1841 06/11/16 2131 06/12/16 0835 06/12/16 1227  GLUCAP 342* 312* 241* 248* 405*   Lipid Profile: No results for input(s): CHOL, HDL, LDLCALC, TRIG, CHOLHDL, LDLDIRECT in the last 72 hours. Thyroid Function Tests: No results for input(s): TSH, T4TOTAL, FREET4, T3FREE, THYROIDAB in the last 72 hours. Anemia Panel: No results for input(s): VITAMINB12, FOLATE, FERRITIN, TIBC, IRON, RETICCTPCT in the last 72 hours. Sepsis Labs:  Recent Labs Lab 06/12/16 0721  PROCALCITON 0.89    Recent Results (from the past 240 hour(s))  Surgical pcr screen     Status: None   Collection Time: 06/10/16  6:39 PM  Result Value Ref Range Status   MRSA, PCR NEGATIVE NEGATIVE Final   Staphylococcus aureus NEGATIVE NEGATIVE Final    Comment:        The Xpert SA Assay (FDA approved for NASAL specimens in patients over 37 years of age), is one component of a comprehensive surveillance program.  Test performance has been validated by [facility] for patients greater than or equal to 69 year old. It is not intended to diagnose infection nor to guide or monitor treatment.   Aerobic/Anaerobic Culture (surgical/deep wound)     Status: None (Preliminary result)   Collection Time: 06/10/16  8:50 PM  Result Value Ref Range Status   Specimen Description ABSCESS LEFT  AXILLA  Final   Special Requests PATIENT ON FOLLOWING ZOSYN  Final   Gram Stain   Final    ABUNDANT WBC PRESENT,BOTH PMN AND MONONUCLEAR ABUNDANT GRAM POSITIVE COCCI IN CLUSTERS IN PAIRS    Culture   Final    CULTURE REINCUBATED FOR BETTER GROWTH Performed at [facility]    Report Status PENDING  Incomplete  Aerobic Culture (superficial specimen)     Status: None (Preliminary result)   Collection Time: 06/11/16 12:31 PM  Result Value Ref Range Status   Specimen Description AXILLA LEFT  Final   Special Requests NONE  Final   Gram Stain   Final    NO WBC SEEN ABUNDANT GRAM POSITIVE COCCI IN PAIRS IN CLUSTERS Performed at [facility]    Culture PENDING  Incomplete   Report Status PENDING  Incomplete         Radiology Studies: Ct Angio Chest Pe W Or Wo  Contrast  Result Date: 06/11/2016 CLINICAL DATA:  Lethargy, hypoxia ; status post LEFT axillary abscess incision and drainage. History hypertension, hyperlipidemia, uncontrolled diabetes. EXAM: CT ANGIOGRAPHY CHEST WITH CONTRAST TECHNIQUE: Multidetector CT imaging of the chest was performed using the standard protocol during bolus administration of intravenous contrast. Multiplanar CT image reconstructions and MIPs were obtained to evaluate the vascular anatomy. CONTRAST:  100 cc Isovue 370 COMPARISON:  Chest radiographs August 24th 1719 hours FINDINGS: PULMONARY ARTERY: Adequate contrast opacification of the pulmonary artery's. Main pulmonary artery is not enlarged. No pulmonary arterial filling defects to the level of the subsegmental branches. MEDIASTINUM: Heart size is mildly enlarged. No RIGHT heart strain. Scattered coronary artery calcifications. No pericardial effusion. Thoracic aorta is normal course and caliber, trace calcific atherosclerosis at the aortic arch. LUNGS: Tracheobronchial tree is patent, no pneumothorax. Dense hypo enhancing consolidation in the lower lobes bilaterally air bronchograms. SOFT TISSUES  AND OSSEOUS STRUCTURES: Included view of the abdomen is unremarkable. LEFT axilla skin defect with small surrounding lymph nodes compatible with history of abscess drainage, no focal fluid collection. IMPRESSION: No acute pulmonary embolism. Bilateral lower lobe pneumonia. Status post LEFT axillary incision and drainage, no focal fluid collection. Electronically Signed   By: Elon Alas M.D.   On: 06/11/2016 18:48   Dg Chest Port 1 View  Result Date: 06/11/2016 CLINICAL DATA:  Hypoxia, weakness today. History of hypertension, diabetes. EXAM: PORTABLE CHEST 1 VIEW COMPARISON:  Chest radiograph December 07, 2011 FINDINGS: The cardiac silhouette is mildly enlarged. Pulmonary vascular congestion and mild interstitial prominence without pleural effusion or focal consolidation. No pneumothorax tiny scattered granulomata. Soft tissue planes included osseous structures are nonsuspicious, high-riding RIGHT humeral head suggests old rotator cuff injury. IMPRESSION: Mild cardiomegaly. Pulmonary vascular congestion. Interstitial prominence favoring pulmonary edema, less likely atypical pneumonia. Electronically Signed   By: Elon Alas M.D.   On: 06/11/2016 17:47        Scheduled Meds: . aspirin  81 mg Oral Daily  . atorvastatin  80 mg Oral q1800  . buPROPion  150 mg Oral Daily  . insulin aspart  0-15 Units Subcutaneous TID WC  . insulin aspart  0-5 Units Subcutaneous QHS  . insulin glargine  10 Units Subcutaneous Daily  . metoCLOPramide (REGLAN) injection  5 mg Intravenous Q8H  . metoprolol tartrate  12.5 mg Oral BID  . nicotine  7 mg Transdermal Daily  . piperacillin-tazobactam (ZOSYN)  IV  3.375 g Intravenous Q8H  . vancomycin  1,000 mg Intravenous Q12H   Continuous Infusions: . heparin    . lactated ringers 30 mL/hr at 06/12/16 1039     LOS: 2 days     Cordelia Poche, MD [facility] 06/12/2016, 12:49 PM Pager: [phone]  If 7PM-7AM, please contact  night-coverage [email or web address] Password [facility] 06/12/2016, 12:49 PM

## 2016-06-12 NOTE — Progress Notes (Signed)
Inpatient Diabetes Program Recommendations  AACE/ADA: New Consensus Statement on Inpatient Glycemic Control (2015)  Target Ranges:  Prepandial:   less than 140 mg/dL      Peak postprandial:   less than 180 mg/dL (1-2 hours)      Critically ill patients:  140 - 180 mg/dL     MD- CBG up to 405 mg/dl at 12 pm today:  Please consider the following in-hospital insulin adjustments:  Start IV Insulin drip per IV Insulin GlucoStabilizer order set  OR  Increase Lantus to 15 units daily (0.2 units/kg dosing) and  Change Novolog Moderate SSI (0-15 units) to Q4 hour coverage      --Will follow patient during hospitalization--  Wyn Quaker RN, MSN, CDE Diabetes Coordinator Inpatient Glycemic Control Team Team Pager: [phone] (8a-5p)

## 2016-06-12 NOTE — Progress Notes (Signed)
Inpatient Diabetes Program Recommendations  AACE/ADA: New Consensus Statement on Inpatient Glycemic Control (2015)  Target Ranges:  Prepandial:   less than 140 mg/dL      Peak postprandial:   less than 180 mg/dL (1-2 hours)      Critically ill patients:  140 - 180 mg/dL   Results for BABOUCARR, SINGERMAN (MRN [id number]) as of 06/12/2016 11:39  Ref. Range 06/11/2016 06:11 06/11/2016 12:10 06/11/2016 15:53 06/11/2016 16:42 06/11/2016 18:41 06/11/2016 21:31 06/12/2016 08:35  Glucose-Capillary Latest Ref Range: 65 - 99 mg/dL 425 (H) 410 (H) 349 (H) 342 (H) 312 (H) 241 (H) 248 (H)   Results for AAYAM, TISCARENO (MRN [id number]) as of 06/12/2016 11:39  Ref. Range 06/10/2016 18:45  Hemoglobin A1C Latest Ref Range: 4.8 - 5.6 % 12.2 (H)    Admit with: Abscess of Axilla  History: DM  Home DM Meds: Glipizide 10 mg bid (not taking for over one year)       Metformin 1000 mg bid (not taking for over one year)  Current Insulin Orders: Lantus 10 units daily      Novolog Moderate Correction Scale/ SSI (0-15 units) TID AC + HS     -Spoke briefly with patient and daughters this AM regarding blood glucose control.  Patient was not appropriate for thorough DM education as he was having increased work of breathing and needed to restart Bipap this AM.  Discussed briefly with patient and his daughters what patient's current A1c is (12.2%).  Explained what an A1c is and what it measures.  Discussed briefly with pt and family that pt will likely need insulin at time of discharge and patient and daughters stated they were agreeable to this plan.  -Patient to transfer to [facility] today for Cardiac Cath.  Not sure is he will return to [place] afterwards.  -Will have RNs continue basic DM education with pt and family as appropriate and will have RNs begin insulin education when appropriate.     --Will follow patient during hospitalization--  Wyn Quaker RN, MSN, CDE Diabetes Coordinator Inpatient  Glycemic Control Team Team Pager: [phone] (8a-5p)

## 2016-06-12 NOTE — Progress Notes (Signed)
RT assessed PT sleeping on NRB-  RT removed 1 flap to make PRB ( at 15 lpm )- Sp02 94%. When PT is speaking Sp02 92%. PT awakes to speak with MD- appears to be alert and oriented. RN aware.

## 2016-06-12 NOTE — Progress Notes (Signed)
Dr. Lonny Prude notified of axillary temp 100.3 and elevated cbgs. Requested every four hour cbgs with coverage as pt is npo on Bipap.  Will continue to monitor pt closely.

## 2016-06-12 NOTE — Progress Notes (Addendum)
Hospital Problem List     Principal Problem:   Abscess of axilla, left Active Problems:   Type 2 diabetes mellitus, uncontrolled ([place])   Essential hypertension   Pure hypercholesterolemia   Acute respiratory failure with hypoxia (HCC)   NSTEMI (non-ST elevated myocardial infarction) [facility])   Hypoxia    Patient Profile:   Primary Cardiologist: New - Dr. Debara Pickett  55 yo male w/ PMH of HTN, HLD, and poorly controlled DM who presented for I&D of left axillary abscess and noted to become lethargic and hypoxic. EKG showed new inferior Q-waves with lateral ST depression and troponin now elevated to 16.06.  Subjective   Had episodes of chest discomfort overnight, relieved with SL NTG and Morphine. Denies any pain at this current time. Switched from BiPAP to NRB.  Inpatient Medications    . aspirin  81 mg Oral Daily  . atorvastatin  80 mg Oral q1800  . buPROPion  150 mg Oral Daily  . insulin aspart  0-15 Units Subcutaneous TID WC  . insulin aspart  0-5 Units Subcutaneous QHS  . insulin glargine  10 Units Subcutaneous Daily  . lisinopril  10 mg Oral Daily  . metoCLOPramide (REGLAN) injection  5 mg Intravenous Q8H  . nicotine  7 mg Transdermal Daily  . piperacillin-tazobactam (ZOSYN)  IV  3.375 g Intravenous Q8H  . vancomycin  750 mg Intravenous Q8H    Vital Signs    Vitals:   06/12/16 0300 06/12/16 0400 06/12/16 0500 06/12/16 0600  BP: (!) 132/114 (!) 112/55 117/70 109/78  Pulse: (!) 103 (!) 105 97 (!) 105  Resp: (!) 24 (!) 25  (!) 22  Temp:  99.5 F (37.5 C)    TempSrc:  Axillary    SpO2: 96% 99% 96% 96%  Weight:      Height:        Intake/Output Summary (Last 24 hours) at 06/12/16 0819 Last data filed at 06/12/16 0600  Gross per 24 hour  Intake          1704.77 ml  Output             1300 ml  Net           404.77 ml   Filed Weights   06/10/16 1812  Weight: 161 lb 9.6 oz (73.3 kg)    Physical Exam    General: Well developed, well nourished, male appearing  in no acute distress. NRB in place. Head: Normocephalic, atraumatic.  Neck: Supple without bruits, JVD at 8cm. Lungs:  Resp regular and unlabored, mild rales at bases bilaterally. Heart: RRR, S1, S2, no S3, S4, or murmur; no rub. Abdomen: Soft, non-tender, non-distended with normoactive bowel sounds. No hepatomegaly. No rebound/guarding. No obvious abdominal masses. Extremities: No clubbing, cyanosis, or edema. Distal pedal pulses are 2+ bilaterally. Neuro: Alert and oriented X 3. Moves all extremities spontaneously. Psych: Normal affect.  Labs    CBC  Recent Labs  06/11/16 1723 06/12/16 0152  WBC 17.7* 18.8*  HGB 14.4 14.5  HCT 42.0 42.2  MCV 82.7 82.7  PLT 152 Q000111Q*   Basic Metabolic Panel  Recent Labs  06/11/16 1723 06/12/16 0152  NA 130* 134*  K 3.6 3.8  CL 92* 96*  CO2 26 27  GLUCOSE 354* 186*  BUN 28* 30*  CREATININE 1.37* 1.19  CALCIUM 8.2* 8.4*   Liver Function Tests  Recent Labs  06/10/16 1845 06/11/16 1723  AST 19 62*  ALT 25 31  ALKPHOS 87 84  BILITOT 0.7 1.1  PROT 7.4 7.2  ALBUMIN 3.8 3.5   No results for input(s): LIPASE, AMYLASE in the last 72 hours. Cardiac Enzymes  Recent Labs  06/11/16 1723 06/11/16 2320  TROPONINI 5.90* 16.06*   BNP Invalid input(s): POCBNP D-Dimer  Recent Labs  06/11/16 1723  DDIMER 1.87*   Hemoglobin A1C  Recent Labs  06/10/16 1845  HGBA1C 12.2*    Telemetry    Sinus tachycardia, HR in 90's to low-100's.   ECG    Sinus tachycardia, HR 104, inferior Q-waves, minimal lateral ST depression.    Cardiac Studies and Radiology    Ct Angio Chest Pe W Or Wo Contrast  Result Date: 06/11/2016 CLINICAL DATA:  Lethargy, hypoxia ; status post LEFT axillary abscess incision and drainage. History hypertension, hyperlipidemia, uncontrolled diabetes. EXAM: CT ANGIOGRAPHY CHEST WITH CONTRAST TECHNIQUE: Multidetector CT imaging of the chest was performed using the standard protocol during bolus administration of  intravenous contrast. Multiplanar CT image reconstructions and MIPs were obtained to evaluate the vascular anatomy. CONTRAST:  100 cc Isovue 370 COMPARISON:  Chest radiographs August 24th 1719 hours FINDINGS: PULMONARY ARTERY: Adequate contrast opacification of the pulmonary artery's. Main pulmonary artery is not enlarged. No pulmonary arterial filling defects to the level of the subsegmental branches. MEDIASTINUM: Heart size is mildly enlarged. No RIGHT heart strain. Scattered coronary artery calcifications. No pericardial effusion. Thoracic aorta is normal course and caliber, trace calcific atherosclerosis at the aortic arch. LUNGS: Tracheobronchial tree is patent, no pneumothorax. Dense hypo enhancing consolidation in the lower lobes bilaterally air bronchograms. SOFT TISSUES AND OSSEOUS STRUCTURES: Included view of the abdomen is unremarkable. LEFT axilla skin defect with small surrounding lymph nodes compatible with history of abscess drainage, no focal fluid collection. IMPRESSION: No acute pulmonary embolism. Bilateral lower lobe pneumonia. Status post LEFT axillary incision and drainage, no focal fluid collection. Electronically Signed   By: Elon Alas M.D.   On: 06/11/2016 18:48   Dg Chest Port 1 View  Result Date: 06/11/2016 CLINICAL DATA:  Hypoxia, weakness today. History of hypertension, diabetes. EXAM: PORTABLE CHEST 1 VIEW COMPARISON:  Chest radiograph December 07, 2011 FINDINGS: The cardiac silhouette is mildly enlarged. Pulmonary vascular congestion and mild interstitial prominence without pleural effusion or focal consolidation. No pneumothorax tiny scattered granulomata. Soft tissue planes included osseous structures are nonsuspicious, high-riding RIGHT humeral head suggests old rotator cuff injury. IMPRESSION: Mild cardiomegaly. Pulmonary vascular congestion. Interstitial prominence favoring pulmonary edema, less likely atypical pneumonia. Electronically Signed   By: Elon Alas  M.D.   On: 06/11/2016 17:47    Echocardiogram: Pending  Assessment & Plan    1. NSTEMI -  Underwent I&D of left axillary abscess on 8/23. On 8/24, he developed acute dyspnea and became lethargic. Initially denied any chest discomfort but developed this overnight. Relieved with NTG and Morphine, now without chest discomfort.  - cyclic troponin values have been 5.90 and 16.06 thus far. Initial EKG showed inferior Q-waves and lateral ST depression. ST depression improved on most recent tracings.  - continue ASA, statin, and Heparin. Will start low-dose BB.  - will need a cardiac catheterization this admission. The patient understands that risks included but are not limited to stroke (1 in 1000), death (1 in 36), kidney failure [usually temporary] (1 in 500), bleeding (1 in 200), allergic reaction [possibly serious] (1 in 200). Currently on NRB. Will discuss with MD timing of cath, potentially later this afternoon if respiratory status improves. Plan reviewed with patient and his daughters at  the bedside.  2. HLD - on Simvastatin 20mg  daily. Switched to Atorvastatin 80mg  in setting of ACS.  - will check Lipid Panel.   3. Acute Hypoxic Respiratory Failure/ PNA - CTA on 8/24 showing bilateral lower lobe pneumonia. WBC trending up to 18.8. Had initially been placed on BiPAP, now on non-rebreather.   - BNP elevated to 559. Echo pending. Had been given IV Lasix yesterday and respiratory status improved. Still appears mildly volume overloaded on physical exam. Will give additional IV Lasix 20mg  this AM.  - CCM following.   4. AKI - creatinine 0.76 on admission, up to 1.37 on 8/24. - trending down to 1.19 this AM.   5. Tobacco Use - Cessation advised.  Arna Medici , PA-C 8:19 AM 06/12/2016 Pager: [phone]  The patient was seen, examined and discussed with Brittainy M. Rosita Fire, PA-C and I agree with the above.   A very pleasant 55 year old gentleman with no prior history  of coronary artery disease, ongoing smoker, with positive family history of premature coronary artery disease, specifically his father died of MI at age of 6, who presented yesterday with an acute abscess in his left axilla and underwent I&D.  While in the hospital he developed acute hypoxic respiratory failure, and chest pain, he underwent chest CT that showed pneumonia but also possible pulmonary edema, history of pontine was elevated and trended up to 16 this morning down to 10. His EKG from 2013 was completely normal is morning it shows Q waves in inferior leads, yesterday also showed negative T waves in the lateral leads. He continues to have mild shortness of breath and physical exam show signs of fluid overload, his kidney function is borderline, will give Lasix 40 mg IV 1 and keep him nothing by mouth for potential catheter this afternoon. He is currently on nasal cannula at 3 L and his O2 sats are 92%. We will transfer the patient to [facility] ICU. The patient agrees with the plan. He is already on aspirin atorvastatin lisinopril, we will start low-dose metoprolol 12.5 mg by mouth twice a day.  Ena Dawley, MD 06/12/2016

## 2016-06-12 NOTE — Progress Notes (Signed)
ANTICOAGULATION CONSULT NOTE - f/u Consult  Pharmacy Consult for Heparin Indication: chest pain/ACS  No Known Allergies  Patient Measurements: Height: 5\' 6"  (167.6 cm) Weight: 161 lb 9.6 oz (73.3 kg) IBW/kg (Calculated) : 63.8 Heparin Dosing Weight: 73 kg  Vital Signs: Temp: 98.9 F (37.2 C) (08/25 0000) Temp Source: Axillary (08/25 0000) BP: 132/114 (08/25 0300) Pulse Rate: 103 (08/25 0300)  Labs:  Recent Labs  06/10/16 1845 06/11/16 1723 06/11/16 2320 06/12/16 0152  HGB 13.5 14.4  --  14.5  HCT 40.9 42.0  --  42.2  PLT 125* 152  --  141*  LABPROT 13.2  --   --   --   INR 1.00  --   --   --   HEPARINUNFRC  --   --   --  <0.10*  CREATININE 0.76 1.37*  --  1.19  TROPONINI  --  5.90* 16.06*  --     Estimated Creatinine Clearance: 63.3 mL/min (by C-G formula based on SCr of 1.19 mg/dL).   Medical History: Past Medical History:  Diagnosis Date  . Abrasion L FOREARM  . Diabetes mellitus without complication ([place])   . Headache(784.0)   . Hematuria, microscopic "SINCE I WAS A KID"  . Hypertension   . Lactose intolerance   . Rotator cuff tear RIGHT    Medications:  Scheduled:  . aspirin  81 mg Oral Daily  . atorvastatin  80 mg Oral q1800  . buPROPion  150 mg Oral Daily  . heparin  2,000 Units Intravenous Once  . insulin aspart  0-15 Units Subcutaneous TID WC  . insulin aspart  0-5 Units Subcutaneous QHS  . insulin glargine  10 Units Subcutaneous Daily  . lisinopril  10 mg Oral Daily  . metoCLOPramide (REGLAN) injection  5 mg Intravenous Q8H  . nicotine  7 mg Transdermal Daily  . piperacillin-tazobactam (ZOSYN)  IV  3.375 g Intravenous Q8H  . vancomycin  750 mg Intravenous Q8H   Infusions:  . heparin    . lactated ringers 75 mL/hr at 06/11/16 0025   PRN: morphine injection, nitroGLYCERIN, ondansetron (ZOFRAN) IV, oxyCODONE-acetaminophen, promethazine  Assessment: 55 yo M admitted on 8/23 for I &D of left axillary abscess complicated by hyperglycemia  post-op. Today, he was noted to be more lethargic, sleepy and hypoxic. CT chest- no acute PE. Pt has elevated troponin. Per MD, start heparin for MI.  Today, 8/25 0152 HL= <0.10, no infusion problems and no bleeding at I&D site noted by RN.   Goal of Therapy:  Heparin level 0.3-0.7 units/ml Monitor platelets by anticoagulation protocol: Yes   Plan:  Rebolus with  2000 units IV heparin x1 Then  increase drip to 1100 units/hr. Will check 6 hr HL  Daily HL and CBC   Lawana Pai R 06/12/2016,3:47 AM

## 2016-06-12 NOTE — Progress Notes (Signed)
  Echocardiogram 2D Echocardiogram has been performed.  Tresa Res 06/12/2016, 12:38 PM

## 2016-06-13 ENCOUNTER — Inpatient Hospital Stay (HOSPITAL_COMMUNITY): Payer: BLUE CROSS/BLUE SHIELD

## 2016-06-13 LAB — CBC
HCT: 38.2 % — ABNORMAL LOW (ref 39.0–52.0)
Hemoglobin: 12.8 g/dL — ABNORMAL LOW (ref 13.0–17.0)
MCH: 28.5 pg (ref 26.0–34.0)
MCHC: 33.5 g/dL (ref 30.0–36.0)
MCV: 85.1 fL (ref 78.0–100.0)
PLATELETS: 133 10*3/uL — AB (ref 150–400)
RBC: 4.49 MIL/uL (ref 4.22–5.81)
RDW: 12.6 % (ref 11.5–15.5)
WBC: 13.6 10*3/uL — ABNORMAL HIGH (ref 4.0–10.5)

## 2016-06-13 LAB — GLUCOSE, CAPILLARY
GLUCOSE-CAPILLARY: 163 mg/dL — AB (ref 65–99)
GLUCOSE-CAPILLARY: 201 mg/dL — AB (ref 65–99)
Glucose-Capillary: 232 mg/dL — ABNORMAL HIGH (ref 65–99)
Glucose-Capillary: 184 mg/dL — ABNORMAL HIGH (ref 65–99)
Glucose-Capillary: 183 mg/dL — ABNORMAL HIGH (ref 65–99)

## 2016-06-13 LAB — HEPARIN LEVEL (UNFRACTIONATED): HEPARIN UNFRACTIONATED: 0.34 [IU]/mL (ref 0.30–0.70)

## 2016-06-13 LAB — PROCALCITONIN: PROCALCITONIN: 0.66 ng/mL

## 2016-06-13 LAB — TROPONIN I: Troponin I: 3.63 ng/mL (ref <0.03)

## 2016-06-13 MED ORDER — DIGOXIN 0.25 MG/ML IJ SOLN
0.2500 mg | Freq: Every day | INTRAMUSCULAR | Status: DC
  Administered 2016-06-14: 0.25 mg via INTRAVENOUS
  Filled 2016-06-13 (×2): qty 2

## 2016-06-13 MED ORDER — AMIODARONE HCL IN DEXTROSE 360-4.14 MG/200ML-% IV SOLN: 60.0000 mg/h | INTRAVENOUS | Status: DC

## 2016-06-13 MED ORDER — FUROSEMIDE 10 MG/ML IJ SOLN
40.0000 mg | Freq: Once | INTRAMUSCULAR | Status: AC
  Administered 2016-06-13: 40 mg via INTRAVENOUS
  Filled 2016-06-13: qty 4

## 2016-06-13 MED ORDER — METOPROLOL TARTRATE 50 MG PO TABS
50.0000 mg | ORAL_TABLET | Freq: Two times a day (BID) | ORAL | Status: DC
  Administered 2016-06-13: 50 mg via ORAL
  Filled 2016-06-13: qty 1

## 2016-06-13 MED ORDER — DILTIAZEM HCL 100 MG IV SOLR
5.0000 mg/h | INTRAVENOUS | Status: DC
  Administered 2016-06-13: 15 mg/h via INTRAVENOUS
  Administered 2016-06-13 – 2016-06-15 (×3): 5 mg/h via INTRAVENOUS
  Filled 2016-06-13 (×3): qty 100

## 2016-06-13 MED ORDER — DILTIAZEM LOAD VIA INFUSION
10.0000 mg | Freq: Once | INTRAVENOUS | Status: AC
  Administered 2016-06-13: 10 mg via INTRAVENOUS
  Filled 2016-06-13: qty 10

## 2016-06-13 MED ORDER — AMIODARONE HCL IN DEXTROSE 360-4.14 MG/200ML-% IV SOLN: 30.0000 mg/h | INTRAVENOUS | Status: DC

## 2016-06-13 NOTE — Progress Notes (Signed)
CRITICAL VALUE ALERT  Critical value received: Troponin 3.63  Date of notification:  06/13/2016  Time of notification:  0651  Critical value read back:Yes.    Nurse who received alert:  Carleene Cooper RN  MD notified (1st page):  Expected value, MD not notified  Time of first page:  1651  MD notified (2nd page):  Time of second page:  Responding MD:  Expected value  Time MD responded:  1651

## 2016-06-13 NOTE — Progress Notes (Signed)
Dr. Broadus John and  Dr. Lovena Le paged.

## 2016-06-13 NOTE — Progress Notes (Signed)
Patient ID: [name], male   DOB: 1961/08/17, 55 y.o.   MRN: [id number]    Patient Name: [name] Date of Encounter: 06/13/2016     Principal Problem:   Abscess of axilla, left Active Problems:   Type 2 diabetes mellitus, uncontrolled ([place])   Essential hypertension   Pure hypercholesterolemia   Acute respiratory failure with hypoxia ([facility])   NSTEMI (non-ST elevated myocardial infarction) ([place])   Hypoxia   Smoking    SUBJECTIVE  C/o sob. Remains ill. Denies chest pain.  CURRENT MEDS . aspirin  81 mg Oral Daily  . aspirin  81 mg Oral Pre-Cath  . atorvastatin  80 mg Oral q1800  . buPROPion  150 mg Oral Daily  . chlorhexidine  15 mL Mouth Rinse BID  . digoxin  0.25 mg Intravenous Daily  . insulin aspart  0-9 Units Subcutaneous Q4H  . insulin glargine  10 Units Subcutaneous Daily  . mouth rinse  15 mL Mouth Rinse q12n4p  . metoCLOPramide (REGLAN) injection  5 mg Intravenous Q8H  . metoprolol tartrate  50 mg Oral BID  . nicotine  7 mg Transdermal Daily  . piperacillin-tazobactam (ZOSYN)  IV  3.375 g Intravenous Q8H  . sodium chloride flush  3 mL Intravenous Q12H  . vancomycin  1,000 mg Intravenous Q12H    OBJECTIVE  Vitals:   06/13/16 0900 06/13/16 0915 06/13/16 0938 06/13/16 1053  BP: 105/74  105/74   Pulse: (!) 128 (!) 137 (!) 133   Resp: 17 15    Temp:      TempSrc:      SpO2: 97% 90%  94%  Weight:      Height:        Intake/Output Summary (Last 24 hours) at 06/13/16 1124 Last data filed at 06/13/16 0830  Gross per 24 hour  Intake          1344.44 ml  Output             1275 ml  Net            69.44 ml   Filed Weights   06/10/16 1812 06/12/16 1330  Weight: 161 lb 9.6 oz (73.3 kg) 161 lb 6 oz (73.2 kg)    PHYSICAL EXAM  General: Pleasant, 55 yo man, sob on Bipap. Neuro: Alert and oriented X 3. Moves all extremities spontaneously. Psych: Normal affect but a little anxious HEENT:  Normal. Bipap mask in place  Neck: Supple without bruits or  JVD. Lungs:  Resp regular and unlabored, CTA. Heart: RRR no s3, s4, or murmurs. Abdomen: Soft, non-tender, non-distended, BS + x 4.  Extremities: No clubbing, cyanosis or edema. DP/PT/Radials 2+ and equal bilaterally.  Accessory Clinical Findings  CBC  Recent Labs  06/12/16 0152 06/13/16 0321  WBC 18.8* 13.6*  HGB 14.5 12.8*  HCT 42.2 38.2*  MCV 82.7 85.1  PLT 141* Q000111Q*   Basic Metabolic Panel  Recent Labs  06/11/16 1723 06/12/16 0152  NA 130* 134*  K 3.6 3.8  CL 92* 96*  CO2 26 27  GLUCOSE 354* 186*  BUN 28* 30*  CREATININE 1.37* 1.19  CALCIUM 8.2* 8.4*   Liver Function Tests  Recent Labs  06/10/16 1845 06/11/16 1723  AST 19 62*  ALT 25 31  ALKPHOS 87 84  BILITOT 0.7 1.1  PROT 7.4 7.2  ALBUMIN 3.8 3.5   No results for input(s): LIPASE, AMYLASE in the last 72 hours. Cardiac Enzymes  Recent Labs  06/11/16  1723 06/11/16 2320 06/12/16 0721  TROPONINI 5.90* 16.06* 10.36*   BNP Invalid input(s): POCBNP D-Dimer  Recent Labs  06/11/16 1723  DDIMER 1.87*   Hemoglobin A1C  Recent Labs  06/10/16 1845  HGBA1C 12.2*   Fasting Lipid Panel No results for input(s): CHOL, HDL, LDLCALC, TRIG, CHOLHDL, LDLDIRECT in the last 72 hours. Thyroid Function Tests No results for input(s): TSH, T4TOTAL, T3FREE, THYROIDAB in the last 72 hours.  Invalid input(s): FREET3  TELE  Atrial fib/flutter with a RVR/CVR  Radiology/Studies  Ct Angio Chest Pe W Or Wo Contrast  Result Date: 06/11/2016 CLINICAL DATA:  Lethargy, hypoxia ; status post LEFT axillary abscess incision and drainage. History hypertension, hyperlipidemia, uncontrolled diabetes. EXAM: CT ANGIOGRAPHY CHEST WITH CONTRAST TECHNIQUE: Multidetector CT imaging of the chest was performed using the standard protocol during bolus administration of intravenous contrast. Multiplanar CT image reconstructions and MIPs were obtained to evaluate the vascular anatomy. CONTRAST:  100 cc Isovue 370 COMPARISON:   Chest radiographs August 24th 1719 hours FINDINGS: PULMONARY ARTERY: Adequate contrast opacification of the pulmonary artery's. Main pulmonary artery is not enlarged. No pulmonary arterial filling defects to the level of the subsegmental branches. MEDIASTINUM: Heart size is mildly enlarged. No RIGHT heart strain. Scattered coronary artery calcifications. No pericardial effusion. Thoracic aorta is normal course and caliber, trace calcific atherosclerosis at the aortic arch. LUNGS: Tracheobronchial tree is patent, no pneumothorax. Dense hypo enhancing consolidation in the lower lobes bilaterally air bronchograms. SOFT TISSUES AND OSSEOUS STRUCTURES: Included view of the abdomen is unremarkable. LEFT axilla skin defect with small surrounding lymph nodes compatible with history of abscess drainage, no focal fluid collection. IMPRESSION: No acute pulmonary embolism. Bilateral lower lobe pneumonia. Status post LEFT axillary incision and drainage, no focal fluid collection. Electronically Signed   By: Elon Alas M.D.   On: 06/11/2016 18:48   Dg Chest Port 1 View  Result Date: 06/11/2016 CLINICAL DATA:  Hypoxia, weakness today. History of hypertension, diabetes. EXAM: PORTABLE CHEST 1 VIEW COMPARISON:  Chest radiograph December 07, 2011 FINDINGS: The cardiac silhouette is mildly enlarged. Pulmonary vascular congestion and mild interstitial prominence without pleural effusion or focal consolidation. No pneumothorax tiny scattered granulomata. Soft tissue planes included osseous structures are nonsuspicious, high-riding RIGHT humeral head suggests old rotator cuff injury. IMPRESSION: Mild cardiomegaly. Pulmonary vascular congestion. Interstitial prominence favoring pulmonary edema, less likely atypical pneumonia. Electronically Signed   By: Elon Alas M.D.   On: 06/11/2016 17:47    ASSESSMENT AND PLAN  1. Post op atrial fib/flutter - continue IV cardiazem and will add IV digoxin for 3 days. Continue IV  heparin 2. NSTEM - he had no angina. He denies chest pain. Will plan heart cath early next week. Will recheck troponin. He does not have angina and I am concerned about silent ischemia. Continue heparin 3. Abscess - he is s/p I/D. He is still draining. I am concerned he may require more debridement 4. COPD - he has a long smoking history. He will need to stop smoking.  Cherell Colvin,M.D.  Jahmar Mckelvy,M.D.  06/13/2016 11:24 AM

## 2016-06-13 NOTE — Progress Notes (Signed)
Pt noted to be in a.fibb/a.flutter sustained between 130-140's. M.Lynch called and new orders received and noted.

## 2016-06-13 NOTE — Progress Notes (Signed)
ANTICOAGULATION CONSULT NOTE - f/u Consult  Pharmacy Consult for Heparin Indication: chest pain/ACS  No Known Allergies  Patient Measurements: Height: 5\' 6"  (167.6 cm) Weight: 161 lb 6 oz (73.2 kg) IBW/kg (Calculated) : 63.8 Heparin Dosing Weight: 73 kg  Vital Signs: Temp: 96.8 F (36 C) (08/26 1200) Temp Source: Axillary (08/26 1200) BP: 90/76 (08/26 1200) Pulse Rate: 70 (08/26 1200)  Labs:  Recent Labs  06/10/16 1845 06/11/16 1723 06/11/16 2320  06/12/16 0152 06/12/16 0721 06/12/16 1131 06/12/16 1900 06/13/16 0321 06/13/16 0839  HGB 13.5 14.4  --   --  14.5  --   --   --  12.8*  --   HCT 40.9 42.0  --   --  42.2  --   --   --  38.2*  --   PLT 125* 152  --   --  141*  --   --   --  133*  --   LABPROT 13.2  --   --   --   --   --   --   --   --   --   INR 1.00  --   --   --   --   --   --   --   --   --   HEPARINUNFRC  --   --   --   < > <0.10*  --  <0.10* 0.16*  --  0.34  CREATININE 0.76 1.37*  --   --  1.19  --   --   --   --   --   TROPONINI  --  5.90* 16.06*  --   --  10.36*  --   --   --   --   < > = values in this interval not displayed.  Estimated Creatinine Clearance: 63.3 mL/min (by C-G formula based on SCr of 1.19 mg/dL).   Medical History: Past Medical History:  Diagnosis Date  . Abrasion L FOREARM  . Diabetes mellitus without complication ([place])   . Headache(784.0)   . Hematuria, microscopic "SINCE I WAS A KID"  . Hypertension   . Lactose intolerance   . Rotator cuff tear RIGHT    Medications:  Scheduled:  . aspirin  81 mg Oral Daily  . aspirin  81 mg Oral Pre-Cath  . atorvastatin  80 mg Oral q1800  . buPROPion  150 mg Oral Daily  . chlorhexidine  15 mL Mouth Rinse BID  . digoxin  0.25 mg Intravenous Daily  . insulin aspart  0-9 Units Subcutaneous Q4H  . insulin glargine  10 Units Subcutaneous Daily  . mouth rinse  15 mL Mouth Rinse q12n4p  . metoCLOPramide (REGLAN) injection  5 mg Intravenous Q8H  . nicotine  7 mg Transdermal Daily  .  piperacillin-tazobactam (ZOSYN)  IV  3.375 g Intravenous Q8H  . sodium chloride flush  3 mL Intravenous Q12H  . vancomycin  1,000 mg Intravenous Q12H   Infusions:  . sodium chloride 10 mL/hr at 06/13/16 0938  . diltiazem (CARDIZEM) infusion Stopped (06/13/16 1150)  . heparin 1,550 Units/hr (06/13/16 0830)   Assessment: 27yoM w/ PMH uncontrolled DM2, HTN, HLD. Admitted on 8/23 for I&D of left axillary abscess. Transferred to ICU 8/24 for sudden onset hypoxia, intubation, concern for aspiration.  He is now noted with NSTEMI  Heparin drip started at [facility] prior to transfer. Heparin drip 1550 uts/hr HL 0.35 at goal CBC ok  Goal of Therapy:  Heparin level 0.3-0.7 units/ml Monitor  platelets by anticoagulation protocol: Yes   Plan: Heparin 1550 uts/hr  Daily HL, CBC  Bonnita Nasuti Pharm.D. CPP, BCPS Clinical Pharmacist [phone] 06/13/2016 2:45 PM

## 2016-06-13 NOTE — Progress Notes (Signed)
PROGRESS NOTE    [name]  [id number] DOB: May 21, 1961 DOA: 06/10/2016 PCP: Kennyth Arnold, FNP    Brief Narrative: [name] is a 55 year old male with history of diabetes mellitus, type II, hypertension, hyperlipidemia who was admitted for incision and drainage of left axilla abscess. Patient developed acute hypoxic respiratory failure on 8/24 and was found to have an NSTEMI, elevation in troponin with associated new Q waves in inferior leads initially without chest pain. Patient was started on heparin and cardiology consulted for management, started on BIPAP and transferred to [facility] now for cardiac cath  Assessment & Plan:   Acute Hypoxic resp failure -due to MI/CHF and aspiration pneumonia -improving -Iv lasix again today, Continue Zosyn -nebs PRN -Wean off BIPAP today if remains stable  NSTEMI - ? Post op silent MI -troponin finally starting to drop, 10.3 yesterday from 16 -ECHO with EF of 25% and significant WMA -Akinesis of the basal-midinferolateral and inferior myocardium -Cards following -continue ASA/Metoprolol-increase dose to 50mg  -Continue heparin -plan for LHC when pulm status better  Acute systolic CHF -due to MI, s/p IV lasix -another dose of IV lasix today  L axillar abscess -s/p I&D 8/23 by Dr.Toth  Aspiration pneumonia -on IV zosyn, day2  Afib with RVR -on cardizem gtt since this am -increase metoprolol dose -Cards following -on IV heparin for NSTEMI  Essential Hypertension Controlled -Lisinopril held on admission  Diabetes mellitus, type 2 Uncontrolled, was not taking any meds >1year, HbA1c is 12.2 -Sliding-scale insulin -Continue Lantus 10 units subcutaneous  Hypercholesterolemia Patient on simvastatin. Will likely need to transition to atorvastatin.  Abscess of left axilla Status post I&D on 8/23 -continue IV vanc, Wound Cx with Staph, sens pending  Tobacco abuse -counseled  DVT prophylaxis: Heparin full dose  Code  Status: Full code  Family Communication: none at bedside  Disposition Plan: Keep in SDU  Consultants:   Cardiology  Pulmonology   Procedures:   Echocardiogram (8/25)  Antimicrobials:  None    Subjective: No chest pain, breathing better  Objective: Vitals:   06/13/16 0430 06/13/16 0500 06/13/16 0530 06/13/16 0600  BP: 113/73 104/71 108/65 124/80  Pulse: (!) 48 (!) 48 (!) 48 (!) 47  Resp: 20 18 19 18   Temp:      TempSrc:      SpO2: 93% 93% 93% 93%  Weight:      Height:        Intake/Output Summary (Last 24 hours) at 06/13/16 0745 Last data filed at 06/13/16 0612  Gross per 24 hour  Intake          1249.44 ml  Output             1275 ml  Net           -25.56 ml   Filed Weights   06/10/16 1812 06/12/16 1330  Weight: 73.3 kg (161 lb 9.6 oz) 73.2 kg (161 lb 6 oz)    Examination:  General exam: AAOx3, on BIPAP, no distress Respiratory system: Mild bibasilar crackles. Respiratory effort normal. Cardiovascular system: S1 & S2 heard, RRR. 2/6 midsystolic ejection murmur. No rubs, gallops or clicks. No pedal edema. Gastrointestinal system: Abdomen is nondistended, soft and nontender. No organomegaly or masses felt. Normal bowel sounds heard. Central nervous system: Alert and oriented. No focal neurological deficits. Extremities: Symmetric 5 x 5 power. Skin: No rashes, lesions or ulcers Psychiatry: Judgement and insight appear normal. Mood & affect appropriate.     Data Reviewed: I have  personally reviewed following labs and imaging studies  CBC:  Recent Labs Lab 06/10/16 1845 06/11/16 1723 06/12/16 0152 06/13/16 0321  WBC 11.3* 17.7* 18.8* 13.6*  HGB 13.5 14.4 14.5 12.8*  HCT 40.9 42.0 42.2 38.2*  MCV 82.6 82.7 82.7 85.1  PLT 125* 152 141* Q000111Q*   Basic Metabolic Panel:  Recent Labs Lab 06/10/16 1845 06/11/16 1723 06/12/16 0152  NA 137 130* 134*  K 3.4* 3.6 3.8  CL 101 92* 96*  CO2 28 26 27   GLUCOSE 215* 354* 186*  BUN 15 28* 30*    CREATININE 0.76 1.37* 1.19  CALCIUM 8.6* 8.2* 8.4*   GFR: Estimated Creatinine Clearance: 63.3 mL/min (by C-G formula based on SCr of 1.19 mg/dL). Liver Function Tests:  Recent Labs Lab 06/10/16 1845 06/11/16 1723  AST 19 62*  ALT 25 31  ALKPHOS 87 84  BILITOT 0.7 1.1  PROT 7.4 7.2  ALBUMIN 3.8 3.5   No results for input(s): LIPASE, AMYLASE in the last 168 hours. No results for input(s): AMMONIA in the last 168 hours. Coagulation Profile:  Recent Labs Lab 06/10/16 1845  INR 1.00   Cardiac Enzymes:  Recent Labs Lab 06/11/16 1723 06/11/16 2320 06/12/16 0721  TROPONINI 5.90* 16.06* 10.36*   BNP (last 3 results) No results for input(s): PROBNP in the last 8760 hours. HbA1C:  Recent Labs  06/10/16 1845  HGBA1C 12.2*   CBG:  Recent Labs Lab 06/12/16 1227 06/12/16 1725 06/12/16 1941 06/12/16 2317 06/13/16 0321  GLUCAP 405* 238* 237* 183* 232*   Lipid Profile: No results for input(s): CHOL, HDL, LDLCALC, TRIG, CHOLHDL, LDLDIRECT in the last 72 hours. Thyroid Function Tests: No results for input(s): TSH, T4TOTAL, FREET4, T3FREE, THYROIDAB in the last 72 hours. Anemia Panel: No results for input(s): VITAMINB12, FOLATE, FERRITIN, TIBC, IRON, RETICCTPCT in the last 72 hours. Sepsis Labs:  Recent Labs Lab 06/12/16 0721 06/13/16 0321  PROCALCITON 0.89 0.66    Recent Results (from the past 240 hour(s))  Surgical pcr screen     Status: None   Collection Time: 06/10/16  6:39 PM  Result Value Ref Range Status   MRSA, PCR NEGATIVE NEGATIVE Final   Staphylococcus aureus NEGATIVE NEGATIVE Final    Comment:        The Xpert SA Assay (FDA approved for NASAL specimens in patients over 34 years of age), is one component of a comprehensive surveillance program.  Test performance has been validated by [facility] for patients greater than or equal to 47 year old. It is not intended to diagnose infection nor to guide or monitor treatment.    Aerobic/Anaerobic Culture (surgical/deep wound)     Status: None (Preliminary result)   Collection Time: 06/10/16  8:50 PM  Result Value Ref Range Status   Specimen Description ABSCESS LEFT AXILLA  Final   Special Requests PATIENT ON FOLLOWING ZOSYN  Final   Gram Stain   Final    ABUNDANT WBC PRESENT,BOTH PMN AND MONONUCLEAR ABUNDANT GRAM POSITIVE COCCI IN CLUSTERS IN PAIRS Performed at [facility]    Culture   Final    ABUNDANT STAPHYLOCOCCUS AUREUS NO ANAEROBES ISOLATED; CULTURE IN PROGRESS FOR 5 DAYS    Report Status PENDING  Incomplete  Aerobic Culture (superficial specimen)     Status: None (Preliminary result)   Collection Time: 06/11/16 12:31 PM  Result Value Ref Range Status   Specimen Description AXILLA LEFT  Final   Special Requests NONE  Final   Gram Stain   Final  NO WBC SEEN ABUNDANT GRAM POSITIVE COCCI IN PAIRS IN CLUSTERS Performed at [place]  Final   Report Status PENDING  Incomplete         Radiology Studies: Ct Angio Chest Pe W Or Wo Contrast  Result Date: 06/11/2016 CLINICAL DATA:  Lethargy, hypoxia ; status post LEFT axillary abscess incision and drainage. History hypertension, hyperlipidemia, uncontrolled diabetes. EXAM: CT ANGIOGRAPHY CHEST WITH CONTRAST TECHNIQUE: Multidetector CT imaging of the chest was performed using the standard protocol during bolus administration of intravenous contrast. Multiplanar CT image reconstructions and MIPs were obtained to evaluate the vascular anatomy. CONTRAST:  100 cc Isovue 370 COMPARISON:  Chest radiographs August 24th 1719 hours FINDINGS: PULMONARY ARTERY: Adequate contrast opacification of the pulmonary artery's. Main pulmonary artery is not enlarged. No pulmonary arterial filling defects to the level of the subsegmental branches. MEDIASTINUM: Heart size is mildly enlarged. No RIGHT heart strain. Scattered coronary artery calcifications. No pericardial  effusion. Thoracic aorta is normal course and caliber, trace calcific atherosclerosis at the aortic arch. LUNGS: Tracheobronchial tree is patent, no pneumothorax. Dense hypo enhancing consolidation in the lower lobes bilaterally air bronchograms. SOFT TISSUES AND OSSEOUS STRUCTURES: Included view of the abdomen is unremarkable. LEFT axilla skin defect with small surrounding lymph nodes compatible with history of abscess drainage, no focal fluid collection. IMPRESSION: No acute pulmonary embolism. Bilateral lower lobe pneumonia. Status post LEFT axillary incision and drainage, no focal fluid collection. Electronically Signed   By: Elon Alas M.D.   On: 06/11/2016 18:48   Dg Chest Port 1 View  Result Date: 06/11/2016 CLINICAL DATA:  Hypoxia, weakness today. History of hypertension, diabetes. EXAM: PORTABLE CHEST 1 VIEW COMPARISON:  Chest radiograph December 07, 2011 FINDINGS: The cardiac silhouette is mildly enlarged. Pulmonary vascular congestion and mild interstitial prominence without pleural effusion or focal consolidation. No pneumothorax tiny scattered granulomata. Soft tissue planes included osseous structures are nonsuspicious, high-riding RIGHT humeral head suggests old rotator cuff injury. IMPRESSION: Mild cardiomegaly. Pulmonary vascular congestion. Interstitial prominence favoring pulmonary edema, less likely atypical pneumonia. Electronically Signed   By: Elon Alas M.D.   On: 06/11/2016 17:47        Scheduled Meds: . aspirin  81 mg Oral Daily  . aspirin  81 mg Oral Pre-Cath  . atorvastatin  80 mg Oral q1800  . buPROPion  150 mg Oral Daily  . chlorhexidine  15 mL Mouth Rinse BID  . insulin aspart  0-9 Units Subcutaneous Q4H  . insulin glargine  10 Units Subcutaneous Daily  . mouth rinse  15 mL Mouth Rinse q12n4p  . metoCLOPramide (REGLAN) injection  5 mg Intravenous Q8H  . metoprolol tartrate  50 mg Oral BID  . nicotine  7 mg Transdermal Daily  .  piperacillin-tazobactam (ZOSYN)  IV  3.375 g Intravenous Q8H  . sodium chloride flush  3 mL Intravenous Q12H  . vancomycin  1,000 mg Intravenous Q12H   Continuous Infusions: . sodium chloride Stopped (06/12/16 1437)  . diltiazem (CARDIZEM) infusion 5 mg/hr (06/13/16 0600)  . heparin 1,550 Units/hr (06/12/16 2047)     LOS: 3 days     Domenic Polite, MD [facility] 06/13/2016, 7:45 AM Pager: [phone]  If 7PM-7AM, please contact night-coverage [email or web address] Password [facility] 06/13/2016, 7:45 AM

## 2016-06-13 NOTE — Progress Notes (Signed)
Dr. Broadus John notified of 7 second pause on monitor, Cardizem drip was stopped and digoxin has not yet been given.  Also notified of earlier episode of diaphoresis and desaturation while on venti mask trial at FiO2 55%. Also notified that cardiologist has been paged as well.  Will continue to monitor pt closely.

## 2016-06-13 NOTE — Progress Notes (Signed)
Pt had 7 second pause (asystole) on monitor. Cardizem drip has been turned off. Pt was asleep, easily aroused, alert and oriented. Now in sinus rhythm with rate 66-69. ZOLL placed in the room.  Will continue to monitor pt closely.

## 2016-06-14 LAB — GLUCOSE, CAPILLARY
GLUCOSE-CAPILLARY: 130 mg/dL — AB (ref 65–99)
GLUCOSE-CAPILLARY: 233 mg/dL — AB (ref 65–99)
GLUCOSE-CAPILLARY: 238 mg/dL — AB (ref 65–99)
Glucose-Capillary: 120 mg/dL — ABNORMAL HIGH (ref 65–99)
Glucose-Capillary: 161 mg/dL — ABNORMAL HIGH (ref 65–99)
Glucose-Capillary: 272 mg/dL — ABNORMAL HIGH (ref 65–99)

## 2016-06-14 LAB — AEROBIC CULTURE  (SUPERFICIAL SPECIMEN): GRAM STAIN: NONE SEEN

## 2016-06-14 LAB — CBC
HCT: 38.7 % — ABNORMAL LOW (ref 39.0–52.0)
Hemoglobin: 12.7 g/dL — ABNORMAL LOW (ref 13.0–17.0)
MCH: 28.2 pg (ref 26.0–34.0)
MCHC: 32.8 g/dL (ref 30.0–36.0)
MCV: 85.8 fL (ref 78.0–100.0)
PLATELETS: 140 10*3/uL — AB (ref 150–400)
RBC: 4.51 MIL/uL (ref 4.22–5.81)
RDW: 12.7 % (ref 11.5–15.5)
WBC: 9.7 10*3/uL (ref 4.0–10.5)

## 2016-06-14 LAB — AEROBIC CULTURE W GRAM STAIN (SUPERFICIAL SPECIMEN)

## 2016-06-14 LAB — TROPONIN I: TROPONIN I: 3.2 ng/mL — AB (ref <0.03)

## 2016-06-14 LAB — PROCALCITONIN: PROCALCITONIN: 0.45 ng/mL

## 2016-06-14 LAB — HEPARIN LEVEL (UNFRACTIONATED)
HEPARIN UNFRACTIONATED: 0.23 [IU]/mL — AB (ref 0.30–0.70)
Heparin Unfractionated: 0.41 IU/mL (ref 0.30–0.70)

## 2016-06-14 MED ORDER — METOPROLOL TARTRATE 25 MG PO TABS
25.0000 mg | ORAL_TABLET | Freq: Two times a day (BID) | ORAL | Status: DC
  Administered 2016-06-14 – 2016-06-15 (×3): 25 mg via ORAL
  Filled 2016-06-14 (×3): qty 1

## 2016-06-14 MED ORDER — WHITE PETROLATUM GEL
Status: AC
  Administered 2016-06-14: 1
  Filled 2016-06-14: qty 1

## 2016-06-14 NOTE — Progress Notes (Signed)
ANTICOAGULATION CONSULT NOTE - f/u Consult  Pharmacy Consult for Heparin Indication: chest pain/ACS  No Known Allergies  Patient Measurements: Height: 5\' 6"  (167.6 cm) Weight: 161 lb 6 oz (73.2 kg) IBW/kg (Calculated) : 63.8 Heparin Dosing Weight: 73 kg  Vital Signs: Temp: 97.9 F (36.6 C) (08/27 1155) Temp Source: Oral (08/27 1155) BP: 101/67 (08/27 1200) Pulse Rate: 74 (08/27 1200)  Labs:  Recent Labs  06/11/16 1723  06/12/16 0152 06/12/16 0721  06/13/16 0321 06/13/16 0839 06/13/16 1610 06/14/16 0325 06/14/16 1213  HGB 14.4  --  14.5  --   --  12.8*  --   --  12.7*  --   HCT 42.0  --  42.2  --   --  38.2*  --   --  38.7*  --   PLT 152  --  141*  --   --  133*  --   --  140*  --   HEPARINUNFRC  --   --  <0.10*  --   < >  --  0.34  --  0.23* 0.41  CREATININE 1.37*  --  1.19  --   --   --   --   --   --   --   TROPONINI 5.90*  < >  --  10.36*  --   --   --  3.63* 3.20*  --   < > = values in this interval not displayed.  Estimated Creatinine Clearance: 63.3 mL/min (by C-G formula based on SCr of 1.19 mg/dL).  Assessment: 55yoM on heparin for NSTEMI. Heparin level 0.4 at goal on 1700 units/hr. CBC stable. No issues with line or bleeding reported per RN.  Goal of Therapy:  Heparin level 0.3-0.7 units/ml Monitor platelets by anticoagulation protocol: Yes   Plan: Continue  heparin 1700 uts/hr  Daily HL, CBC   Bonnita Nasuti Pharm.D. CPP, BCPS Clinical Pharmacist [phone] 06/14/2016 1:52 PM

## 2016-06-14 NOTE — Progress Notes (Signed)
Patient ID: DARLA GREENWALT, male   DOB: 30-May-1961, 55 y.o.   MRN: [id number]    Patient Name: [name] Date of Encounter: 06/14/2016     Principal Problem:   Abscess of axilla, left Active Problems:   Type 2 diabetes mellitus, uncontrolled ([place])   Essential hypertension   Pure hypercholesterolemia   Acute respiratory failure with hypoxia ([facility])   NSTEMI (non-ST elevated myocardial infarction) ([place])   Hypoxia   Smoking    SUBJECTIVE  " I feel better today", dyspnea is improved, no chest pain  CURRENT MEDS . aspirin  81 mg Oral Daily  . atorvastatin  80 mg Oral q1800  . buPROPion  150 mg Oral Daily  . chlorhexidine  15 mL Mouth Rinse BID  . digoxin  0.25 mg Intravenous Daily  . insulin aspart  0-9 Units Subcutaneous Q4H  . insulin glargine  10 Units Subcutaneous Daily  . mouth rinse  15 mL Mouth Rinse q12n4p  . metoCLOPramide (REGLAN) injection  5 mg Intravenous Q8H  . metoprolol tartrate  25 mg Oral BID  . nicotine  7 mg Transdermal Daily  . piperacillin-tazobactam (ZOSYN)  IV  3.375 g Intravenous Q8H  . sodium chloride flush  3 mL Intravenous Q12H  . vancomycin  1,000 mg Intravenous Q12H    OBJECTIVE  Vitals:   06/14/16 0230 06/14/16 0420 06/14/16 0700 06/14/16 0800  BP: 108/75 113/78 115/69 120/68  Pulse: 86 84 79 80  Resp: 16 15 14 12   Temp:  98.1 F (36.7 C)  (!) 96.7 F (35.9 C)  TempSrc:  Oral  Axillary  SpO2: 92% 94% 97% 94%  Weight:      Height:        Intake/Output Summary (Last 24 hours) at 06/14/16 0915 Last data filed at 06/14/16 0800  Gross per 24 hour  Intake          1409.87 ml  Output              800 ml  Net           609.87 ml   Filed Weights   06/10/16 1812 06/12/16 1330  Weight: 161 lb 9.6 oz (73.3 kg) 161 lb 6 oz (73.2 kg)    PHYSICAL EXAM  General: Pleasant, middle-aged man, NAD. Neuro: Alert and oriented X 3. Moves all extremities spontaneously. Psych: Normal affect. HEENT:  Normal  Neck: Supple without bruits or  JVD. Lungs:  Resp regular and unlabored, CTA. bandage left axilla with small amount of drainage Heart: RRR no s3, s4, or murmurs. Abdomen: Soft, non-tender, non-distended, BS + x 4.  Extremities: No clubbing, cyanosis or edema. DP/PT/Radials 2+ and equal bilaterally.  Accessory Clinical Findings  CBC  Recent Labs  06/13/16 0321 06/14/16 0325  WBC 13.6* 9.7  HGB 12.8* 12.7*  HCT 38.2* 38.7*  MCV 85.1 85.8  PLT 133* [id number]*   Basic Metabolic Panel  Recent Labs  06/11/16 1723 06/12/16 0152  NA 130* 134*  K 3.6 3.8  CL 92* 96*  CO2 26 27  GLUCOSE 354* 186*  BUN 28* 30*  CREATININE 1.37* 1.19  CALCIUM 8.2* 8.4*   Liver Function Tests  Recent Labs  06/11/16 1723  AST 62*  ALT 31  ALKPHOS 84  BILITOT 1.1  PROT 7.2  ALBUMIN 3.5   No results for input(s): LIPASE, AMYLASE in the last 72 hours. Cardiac Enzymes  Recent Labs  06/12/16 0721 06/13/16 1610 06/14/16 0325  TROPONINI 10.36* 3.63* 3.20*  BNP Invalid input(s): POCBNP D-Dimer  Recent Labs  06/11/16 1723  DDIMER 1.87*   Hemoglobin A1C No results for input(s): HGBA1C in the last 72 hours. Fasting Lipid Panel No results for input(s): CHOL, HDL, LDLCALC, TRIG, CHOLHDL, LDLDIRECT in the last 72 hours. Thyroid Function Tests No results for input(s): TSH, T4TOTAL, T3FREE, THYROIDAB in the last 72 hours.  Invalid input(s): FREET3  TELE  Sinus rhythm    Radiology/Studies  Ct Angio Chest Pe W Or Wo Contrast  Result Date: 06/11/2016 CLINICAL DATA:  Lethargy, hypoxia ; status post LEFT axillary abscess incision and drainage. History hypertension, hyperlipidemia, uncontrolled diabetes. EXAM: CT ANGIOGRAPHY CHEST WITH CONTRAST TECHNIQUE: Multidetector CT imaging of the chest was performed using the standard protocol during bolus administration of intravenous contrast. Multiplanar CT image reconstructions and MIPs were obtained to evaluate the vascular anatomy. CONTRAST:  100 cc Isovue 370 COMPARISON:   Chest radiographs August 24th 1719 hours FINDINGS: PULMONARY ARTERY: Adequate contrast opacification of the pulmonary artery's. Main pulmonary artery is not enlarged. No pulmonary arterial filling defects to the level of the subsegmental branches. MEDIASTINUM: Heart size is mildly enlarged. No RIGHT heart strain. Scattered coronary artery calcifications. No pericardial effusion. Thoracic aorta is normal course and caliber, trace calcific atherosclerosis at the aortic arch. LUNGS: Tracheobronchial tree is patent, no pneumothorax. Dense hypo enhancing consolidation in the lower lobes bilaterally air bronchograms. SOFT TISSUES AND OSSEOUS STRUCTURES: Included view of the abdomen is unremarkable. LEFT axilla skin defect with small surrounding lymph nodes compatible with history of abscess drainage, no focal fluid collection. IMPRESSION: No acute pulmonary embolism. Bilateral lower lobe pneumonia. Status post LEFT axillary incision and drainage, no focal fluid collection. Electronically Signed   By: Elon Alas M.D.   On: 06/11/2016 18:48   Dg Chest Port 1 View  Result Date: 06/13/2016 CLINICAL DATA:  Shortness of breath. EXAM: PORTABLE CHEST 1 VIEW COMPARISON:  06/11/2016 FINDINGS: Mild cardiac enlargement. Bilateral pleural effusions and moderate interstitial edema is identified compatible with CHF. IMPRESSION: Moderate CHF. Electronically Signed   By: Kerby Moors M.D.   On: 06/13/2016 12:37   Dg Chest Port 1 View  Result Date: 06/11/2016 CLINICAL DATA:  Hypoxia, weakness today. History of hypertension, diabetes. EXAM: PORTABLE CHEST 1 VIEW COMPARISON:  Chest radiograph December 07, 2011 FINDINGS: The cardiac silhouette is mildly enlarged. Pulmonary vascular congestion and mild interstitial prominence without pleural effusion or focal consolidation. No pneumothorax tiny scattered granulomata. Soft tissue planes included osseous structures are nonsuspicious, high-riding RIGHT humeral head suggests old  rotator cuff injury. IMPRESSION: Mild cardiomegaly. Pulmonary vascular congestion. Interstitial prominence favoring pulmonary edema, less likely atypical pneumonia. Electronically Signed   By: Elon Alas M.D.   On: 06/11/2016 17:47    ASSESSMENT AND PLAN  1.postoperative atrial fibrillation/flutter - he is return to sinus rhythm with a 6-1/2 second pause. He is on low-dose Cardizem. We have decided to hold the digoxin. He will continue his intravenous heparin for now. 2. Non-ST elevation MI - he is currently pain-free. He has never had anginal symptoms. Would anticipate left heart catheterization probably on Tuesday. 3. Left axilla abscess status post drainage - his white count is improved and his temperature is down. He will continue antibiotic therapy. 4. COPD - he is encouraged to stop smoking. His shortness of breath is much better now that he is back in sinus rhythm.  Gregg Taylor,M.D.  06/14/2016 9:15 AM

## 2016-06-14 NOTE — Progress Notes (Signed)
PROGRESS NOTE    [name]  [id number] DOB: July 03, 1961 DOA: 06/10/2016 PCP: Kennyth Arnold, FNP    Brief Narrative: [name] is a 55 year old male with history of diabetes mellitus, type II, hypertension, hyperlipidemia who was admitted for incision and drainage of left axilla abscess. Patient developed acute hypoxic respiratory failure on 8/24 and was found to have an NSTEMI, elevation in troponin with associated new Q waves in inferior leads initially without chest pain. Patient was started on heparin and cardiology consulted for management, started on BIPAP and transferred to [facility] now for cardiac cath  Assessment & Plan:   Acute Hypoxic resp failure -due to MI/CHF and aspiration pneumonia -improving -Continue Zosyn Day 3, change to PO levaquin tomorrow -nebs PRN -Weaned off BIPAP  NSTEMI - ? Post op silent MI -troponin trending down from peak of16 -ECHO with EF of 25% and significant WMA -Akinesis of the basal-midinferolateral and inferior myocardium -Cards following -continue ASA/Metoprolol and IV heparin -plan for LHC ? tomorrow  Acute systolic CHF -due to MI, s/p IV lasix -appears euvolemic now  L axillar abscess -s/p I&D 8/23 by Dr.Toth  Aspiration pneumonia -on IV zosyn, day3  Afib with RVR -back on cardizem gtt since this am, this was stopped yesterday after pause then resumed due to recurrence of AFib RVR -restart metoprolol dose and wean off Cardizem -Cards following -on IV heparin for NSTEMI  Essential Hypertension Controlled -Lisinopril held on admission  Diabetes mellitus, type 2 Uncontrolled, was not taking any meds >1year, HbA1c is 12.2 -Sliding-scale insulin -Continue Lantus 10 units subcutaneous  Hypercholesterolemia Patient on simvastatin. Will likely need to transition to atorvastatin.  Abscess of left axilla Status post I&D on 8/23 -continue IV vanc, Wound Cx with Staph, sens pending  Tobacco abuse -counseled  DVT  prophylaxis: Heparin full dose  Code Status: Full code  Family Communication: none at bedside  Disposition Plan: Keep in SDU  Consultants:   Cardiology  Pulmonology   Procedures:   Echocardiogram (8/25)  Antimicrobials:  None    Subjective: No chest pain, breathing better  Objective: Vitals:   06/14/16 0700 06/14/16 0800 06/14/16 0900 06/14/16 0930  BP: 115/69 120/68 115/63 113/64  Pulse: 79 80 83 86  Resp: 14 12 14 17   Temp:  (!) 96.7 F (35.9 C)    TempSrc:  Axillary    SpO2: 97% 94% 94% 90%  Weight:      Height:        Intake/Output Summary (Last 24 hours) at 06/14/16 1145 Last data filed at 06/14/16 1012  Gross per 24 hour  Intake          1358.45 ml  Output              800 ml  Net           558.45 ml   Filed Weights   06/10/16 1812 06/12/16 1330  Weight: 73.3 kg (161 lb 9.6 oz) 73.2 kg (161 lb 6 oz)    Examination:  General exam: AAOx3, on BIPAP, no distress Respiratory system: Mild bibasilar crackles. Respiratory effort normal. Cardiovascular system: S1 & S2 heard, RRR. 2/6 midsystolic ejection murmur. No rubs, gallops or clicks. No pedal edema. Gastrointestinal system: Abdomen is nondistended, soft and nontender. No organomegaly or masses felt. Normal bowel sounds heard. Central nervous system: Alert and oriented. No focal neurological deficits. Extremities: Symmetric 5 x 5 power, dressing in L axilla Skin: No rashes, lesions or ulcers Psychiatry: Judgement and insight appear  normal. Mood & affect appropriate.     Data Reviewed: I have personally reviewed following labs and imaging studies  CBC:  Recent Labs Lab 06/10/16 1845 06/11/16 1723 06/12/16 0152 06/13/16 0321 06/14/16 0325  WBC 11.3* 17.7* 18.8* 13.6* 9.7  HGB 13.5 14.4 14.5 12.8* 12.7*  HCT 40.9 42.0 42.2 38.2* 38.7*  MCV 82.6 82.7 82.7 85.1 85.8  PLT 125* 152 141* 133* [id number]*   Basic Metabolic Panel:  Recent Labs Lab 06/10/16 1845 06/11/16 1723 06/12/16 0152  NA 137  130* 134*  K 3.4* 3.6 3.8  CL 101 92* 96*  CO2 28 26 27   GLUCOSE 215* 354* 186*  BUN 15 28* 30*  CREATININE 0.76 1.37* 1.19  CALCIUM 8.6* 8.2* 8.4*   GFR: Estimated Creatinine Clearance: 63.3 mL/min (by C-G formula based on SCr of 1.19 mg/dL). Liver Function Tests:  Recent Labs Lab 06/10/16 1845 06/11/16 1723  AST 19 62*  ALT 25 31  ALKPHOS 87 84  BILITOT 0.7 1.1  PROT 7.4 7.2  ALBUMIN 3.8 3.5   No results for input(s): LIPASE, AMYLASE in the last 168 hours. No results for input(s): AMMONIA in the last 168 hours. Coagulation Profile:  Recent Labs Lab 06/10/16 1845  INR 1.00   Cardiac Enzymes:  Recent Labs Lab 06/11/16 1723 06/11/16 2320 06/12/16 0721 06/13/16 1610 06/14/16 0325  TROPONINI 5.90* 16.06* 10.36* 3.63* 3.20*   BNP (last 3 results) No results for input(s): PROBNP in the last 8760 hours. HbA1C: No results for input(s): HGBA1C in the last 72 hours. CBG:  Recent Labs Lab 06/13/16 1618 06/13/16 2015 06/13/16 2343 06/14/16 0417 06/14/16 0848  GLUCAP 201* 183* 130* 120* 161*   Lipid Profile: No results for input(s): CHOL, HDL, LDLCALC, TRIG, CHOLHDL, LDLDIRECT in the last 72 hours. Thyroid Function Tests: No results for input(s): TSH, T4TOTAL, FREET4, T3FREE, THYROIDAB in the last 72 hours. Anemia Panel: No results for input(s): VITAMINB12, FOLATE, FERRITIN, TIBC, IRON, RETICCTPCT in the last 72 hours. Sepsis Labs:  Recent Labs Lab 06/12/16 0721 06/13/16 0321 06/14/16 0325  PROCALCITON 0.89 0.66 0.45    Recent Results (from the past 240 hour(s))  Surgical pcr screen     Status: None   Collection Time: 06/10/16  6:39 PM  Result Value Ref Range Status   MRSA, PCR NEGATIVE NEGATIVE Final   Staphylococcus aureus NEGATIVE NEGATIVE Final    Comment:        The Xpert SA Assay (FDA approved for NASAL specimens in patients over 16 years of age), is one component of a comprehensive surveillance program.  Test performance has been  validated by [facility] for patients greater than or equal to 55 year old. It is not intended to diagnose infection nor to guide or monitor treatment.   Aerobic/Anaerobic Culture (surgical/deep wound)     Status: None (Preliminary result)   Collection Time: 06/10/16  8:50 PM  Result Value Ref Range Status   Specimen Description ABSCESS LEFT AXILLA  Final   Special Requests PATIENT ON FOLLOWING ZOSYN  Final   Gram Stain   Final    ABUNDANT WBC PRESENT,BOTH PMN AND MONONUCLEAR ABUNDANT GRAM POSITIVE COCCI IN CLUSTERS IN PAIRS Performed at [facility]    Culture   Final    ABUNDANT STAPHYLOCOCCUS AUREUS NO ANAEROBES ISOLATED; CULTURE IN PROGRESS FOR 5 DAYS    Report Status PENDING  Incomplete  Aerobic Culture (superficial specimen)     Status: None (Preliminary result)   Collection Time: 06/11/16 12:31 PM  Result Value Ref Range Status   Specimen Description AXILLA LEFT  Final   Special Requests NONE  Final   Gram Stain   Final    NO WBC SEEN ABUNDANT GRAM POSITIVE COCCI IN PAIRS IN CLUSTERS Performed at [facility]    Culture   Final    ABUNDANT METHICILLIN RESISTANT STAPHYLOCOCCUS AUREUS   Report Status PENDING  Incomplete   Organism ID, Bacteria METHICILLIN RESISTANT STAPHYLOCOCCUS AUREUS  Final      Susceptibility   Methicillin resistant staphylococcus aureus - MIC*    CIPROFLOXACIN >=8 RESISTANT Resistant     ERYTHROMYCIN >=8 RESISTANT Resistant     GENTAMICIN <=0.5 SENSITIVE Sensitive     OXACILLIN >=4 RESISTANT Resistant     TETRACYCLINE <=1 SENSITIVE Sensitive     VANCOMYCIN <=0.5 SENSITIVE Sensitive     TRIMETH/SULFA <=10 SENSITIVE Sensitive     CLINDAMYCIN <=0.25 SENSITIVE Sensitive     RIFAMPIN <=0.5 SENSITIVE Sensitive     Inducible Clindamycin NEGATIVE Sensitive     * ABUNDANT METHICILLIN RESISTANT STAPHYLOCOCCUS AUREUS         Radiology Studies: Dg Chest Port 1 View  Result Date: 06/13/2016 CLINICAL DATA:  Shortness of breath.  EXAM: PORTABLE CHEST 1 VIEW COMPARISON:  06/11/2016 FINDINGS: Mild cardiac enlargement. Bilateral pleural effusions and moderate interstitial edema is identified compatible with CHF. IMPRESSION: Moderate CHF. Electronically Signed   By: Kerby Moors M.D.   On: 06/13/2016 12:37        Scheduled Meds: . aspirin  81 mg Oral Daily  . atorvastatin  80 mg Oral q1800  . buPROPion  150 mg Oral Daily  . chlorhexidine  15 mL Mouth Rinse BID  . digoxin  0.25 mg Intravenous Daily  . insulin aspart  0-9 Units Subcutaneous Q4H  . insulin glargine  10 Units Subcutaneous Daily  . mouth rinse  15 mL Mouth Rinse q12n4p  . metoCLOPramide (REGLAN) injection  5 mg Intravenous Q8H  . metoprolol tartrate  25 mg Oral BID  . nicotine  7 mg Transdermal Daily  . piperacillin-tazobactam (ZOSYN)  IV  3.375 g Intravenous Q8H  . sodium chloride flush  3 mL Intravenous Q12H  . vancomycin  1,000 mg Intravenous Q12H   Continuous Infusions: . sodium chloride Stopped (06/14/16 0700)  . diltiazem (CARDIZEM) infusion 5 mg/hr (06/14/16 0700)  . heparin 1,700 Units/hr (06/14/16 0700)     LOS: 4 days     Domenic Polite, MD [facility] 06/14/2016, 11:45 AM Pager: [phone]  If 7PM-7AM, please contact night-coverage [email or web address] Password [email or web address] 06/14/2016, 11:45 AM

## 2016-06-14 NOTE — Progress Notes (Addendum)
Pt is resting comfortably on VM.  Pt states that he does not like the BiPAP and wants to avoid wearing.  RT noticed that the bridge of pt's nose is red.  RT spoke with the pt and has determined that before placing pt on BiPAP, RT would switch to a NRB. BiPAP will only be placed if pt shows increased WOB and/or decreased SpO2.  Pt has agreed to this.

## 2016-06-14 NOTE — Progress Notes (Signed)
Removed patient from BiPAP and placed on 55% venti mask due to discomfort and wanting something to drink. Patient is not showing signs of respiratory distress and has a SpO2 of 97%. RT will continue to monitor.

## 2016-06-14 NOTE — Progress Notes (Signed)
ANTICOAGULATION CONSULT NOTE - f/u Consult  Pharmacy Consult for Heparin Indication: chest pain/ACS  No Known Allergies  Patient Measurements: Height: 5\' 6"  (167.6 cm) Weight: 161 lb 6 oz (73.2 kg) IBW/kg (Calculated) : 63.8 Heparin Dosing Weight: 73 kg  Vital Signs: Temp: 98.1 F (36.7 C) (08/27 0420) Temp Source: Oral (08/27 0420) BP: 113/78 (08/27 0420) Pulse Rate: 84 (08/27 0420)  Labs:  Recent Labs  06/11/16 1723 06/11/16 2320 06/12/16 0152 06/12/16 0721  06/12/16 1900 06/13/16 0321 06/13/16 0839 06/13/16 1610 06/14/16 0325  HGB 14.4  --  14.5  --   --   --  12.8*  --   --  12.7*  HCT 42.0  --  42.2  --   --   --  38.2*  --   --  38.7*  PLT 152  --  141*  --   --   --  133*  --   --  140*  HEPARINUNFRC  --   --  <0.10*  --   < > 0.16*  --  0.34  --  0.23*  CREATININE 1.37*  --  1.19  --   --   --   --   --   --   --   TROPONINI 5.90* 16.06*  --  10.36*  --   --   --   --  3.63*  --   < > = values in this interval not displayed.  Estimated Creatinine Clearance: 63.3 mL/min (by C-G formula based on SCr of 1.19 mg/dL).  Assessment: 55yoM on heparin for NSTEMI. Heparin level down to 0.23 (subtherapeutic) on 1550 units/hr. CBC stable. No issues with line or bleeding reported per RN.  Goal of Therapy:  Heparin level 0.3-0.7 units/ml Monitor platelets by anticoagulation protocol: Yes   Plan: Increase heparin to 1700 uts/hr  F/u 6 hr heparin level  Sherlon Handing, PharmD, BCPS Clinical pharmacist, pager [phone] 06/14/2016 5:06 AM

## 2016-06-15 ENCOUNTER — Encounter (HOSPITAL_COMMUNITY): Payer: Self-pay | Admitting: *Deleted

## 2016-06-15 ENCOUNTER — Encounter: Payer: Self-pay | Admitting: *Deleted

## 2016-06-15 ENCOUNTER — Inpatient Hospital Stay (HOSPITAL_COMMUNITY): Payer: BLUE CROSS/BLUE SHIELD

## 2016-06-15 DIAGNOSIS — Z006 Encounter for examination for normal comparison and control in clinical research program: Secondary | ICD-10-CM

## 2016-06-15 LAB — CBC
HEMATOCRIT: 41.2 % (ref 39.0–52.0)
HEMOGLOBIN: 13.7 g/dL (ref 13.0–17.0)
MCH: 28.1 pg (ref 26.0–34.0)
MCHC: 33.3 g/dL (ref 30.0–36.0)
MCV: 84.6 fL (ref 78.0–100.0)
Platelets: 158 10*3/uL (ref 150–400)
RBC: 4.87 MIL/uL (ref 4.22–5.81)
RDW: 12.5 % (ref 11.5–15.5)
WBC: 11.5 10*3/uL — AB (ref 4.0–10.5)

## 2016-06-15 LAB — BASIC METABOLIC PANEL
Anion gap: 6 (ref 5–15)
BUN: 16 mg/dL (ref 6–20)
CALCIUM: 8 mg/dL — AB (ref 8.9–10.3)
CO2: 34 mmol/L — ABNORMAL HIGH (ref 22–32)
Chloride: 97 mmol/L — ABNORMAL LOW (ref 101–111)
Creatinine, Ser: 0.95 mg/dL (ref 0.61–1.24)
GFR calc Af Amer: >60 mL/min (ref >60)
GLUCOSE: 155 mg/dL — AB (ref 65–99)
Potassium: 2.9 mmol/L — ABNORMAL LOW (ref 3.5–5.1)
SODIUM: 137 mmol/L (ref 135–145)

## 2016-06-15 LAB — GLUCOSE, CAPILLARY
GLUCOSE-CAPILLARY: 167 mg/dL — AB (ref 65–99)
GLUCOSE-CAPILLARY: 176 mg/dL — AB (ref 65–99)
GLUCOSE-CAPILLARY: 187 mg/dL — AB (ref 65–99)
GLUCOSE-CAPILLARY: 205 mg/dL — AB (ref 65–99)
GLUCOSE-CAPILLARY: 206 mg/dL — AB (ref 65–99)
GLUCOSE-CAPILLARY: 210 mg/dL — AB (ref 65–99)
Glucose-Capillary: 383 mg/dL — ABNORMAL HIGH (ref 65–99)
Glucose-Capillary: 244 mg/dL — ABNORMAL HIGH (ref 65–99)

## 2016-06-15 LAB — HEPARIN LEVEL (UNFRACTIONATED): Heparin Unfractionated: 0.43 IU/mL (ref 0.30–0.70)

## 2016-06-15 LAB — VANCOMYCIN, TROUGH: VANCOMYCIN TR: 14 ug/mL — AB (ref 15–20)

## 2016-06-15 MED ORDER — POTASSIUM CHLORIDE CRYS ER 20 MEQ PO TBCR
40.0000 meq | EXTENDED_RELEASE_TABLET | ORAL | Status: AC
  Administered 2016-06-15 (×2): 40 meq via ORAL
  Filled 2016-06-15 (×2): qty 2

## 2016-06-15 MED ORDER — LISINOPRIL 2.5 MG PO TABS
2.5000 mg | ORAL_TABLET | Freq: Every day | ORAL | Status: DC
  Administered 2016-06-15 – 2016-06-17 (×3): 2.5 mg via ORAL
  Filled 2016-06-15 (×2): qty 1

## 2016-06-15 MED ORDER — LEVOFLOXACIN 500 MG PO TABS
500.0000 mg | ORAL_TABLET | Freq: Every day | ORAL | Status: DC
  Administered 2016-06-15 – 2016-06-16 (×2): 500 mg via ORAL
  Filled 2016-06-15 (×3): qty 1

## 2016-06-15 MED ORDER — SODIUM CHLORIDE 0.9% FLUSH: 3.0000 mL | Freq: Two times a day (BID) | INTRAVENOUS | Status: DC

## 2016-06-15 MED ORDER — FUROSEMIDE 10 MG/ML IJ SOLN
40.0000 mg | Freq: Two times a day (BID) | INTRAMUSCULAR | Status: AC
  Administered 2016-06-15 (×2): 40 mg via INTRAVENOUS
  Filled 2016-06-15 (×2): qty 4

## 2016-06-15 MED ORDER — ASPIRIN 81 MG PO CHEW
81.0000 mg | CHEWABLE_TABLET | ORAL | Status: AC
  Administered 2016-06-16: 81 mg via ORAL
  Filled 2016-06-15: qty 1

## 2016-06-15 MED ORDER — ORAL CARE MOUTH RINSE
15.0000 mL | Freq: Two times a day (BID) | OROMUCOSAL | Status: DC
  Administered 2016-06-16 – 2016-06-19 (×5): 15 mL via OROMUCOSAL

## 2016-06-15 MED ORDER — METOPROLOL TARTRATE 50 MG PO TABS
50.0000 mg | ORAL_TABLET | Freq: Two times a day (BID) | ORAL | Status: DC
  Administered 2016-06-15 – 2016-06-19 (×8): 50 mg via ORAL
  Filled 2016-06-15 (×8): qty 1

## 2016-06-15 MED ORDER — VANCOMYCIN HCL 10 G IV SOLR
1250.0000 mg | Freq: Two times a day (BID) | INTRAVENOUS | Status: DC
  Administered 2016-06-15 – 2016-06-19 (×9): 1250 mg via INTRAVENOUS
  Filled 2016-06-15 (×13): qty 1250

## 2016-06-15 MED ORDER — POTASSIUM CHLORIDE CRYS ER 20 MEQ PO TBCR
40.0000 meq | EXTENDED_RELEASE_TABLET | Freq: Two times a day (BID) | ORAL | Status: DC
  Administered 2016-06-15 – 2016-06-19 (×8): 40 meq via ORAL
  Filled 2016-06-15 (×8): qty 2

## 2016-06-15 MED ORDER — LIVING WELL WITH DIABETES BOOK
Freq: Once | Status: AC
  Administered 2016-06-15: 14:00:00
  Filled 2016-06-15: qty 1

## 2016-06-15 MED ORDER — INSULIN STARTER KIT- PEN NEEDLES (ENGLISH)
1.0000 | Freq: Once | Status: AC
  Administered 2016-06-15: 1
  Filled 2016-06-15: qty 1

## 2016-06-15 MED ORDER — SODIUM CHLORIDE 0.9 % IV SOLN: INTRAVENOUS | Status: DC

## 2016-06-15 MED ORDER — SODIUM CHLORIDE 0.9 % IV SOLN: 250.0000 mL | INTRAVENOUS | Status: DC | PRN

## 2016-06-15 MED ORDER — SODIUM CHLORIDE 0.9% FLUSH: 3.0000 mL | INTRAVENOUS | Status: DC | PRN

## 2016-06-15 NOTE — Progress Notes (Signed)
5 Days Post-Op  Subjective:N NAE   Objective: Vital signs in last 24 hours: Temp:  [96.8 F (36 C)-98.2 F (36.8 C)] 98.1 F (36.7 C) (08/28 0753) Pulse Rate:  [70-93] 72 (08/28 0753) Resp:  [14-28] 18 (08/28 0753) BP: (93-120)/(63-84) 97/63 (08/28 0753) SpO2:  [89 %-98 %] 96 % (08/28 0753) FiO2 (%):  [55 %-100 %] 100 % (08/27 2230) Last BM Date: 06/10/16  Intake/Output from previous day: 08/27 0701 - 08/28 0700 In: 1796 [P.O.:720; I.V.:476; IV Piggyback:600] Out: -  Intake/Output this shift: No intake/output data recorded.  General appearance: alert and cooperative Incision/Wound: purulent drainage from L ax wound, min erythema   Lab Results:   Recent Labs  06/14/16 0325 06/15/16 0420  WBC 9.7 11.5*  HGB 12.7* 13.7  HCT 38.7* 41.2  PLT 140* 158   BMET No results for input(s): NA, K, CL, CO2, GLUCOSE, BUN, CREATININE, CALCIUM in the last 72 hours. PT/INR No results for input(s): LABPROT, INR in the last 72 hours. ABG  Recent Labs  06/12/16 1103  PHART 7.433  HCO3 25.2*    Results for orders placed or performed during the hospital encounter of 06/10/16  Surgical pcr screen     Status: None   Collection Time: 06/10/16  6:39 PM  Result Value Ref Range Status   MRSA, PCR NEGATIVE NEGATIVE Final   Staphylococcus aureus NEGATIVE NEGATIVE Final    Comment:        The Xpert SA Assay (FDA approved for NASAL specimens in patients over 91 years of age), is one component of a comprehensive surveillance program.  Test performance has been validated by [facility] for patients greater than or equal to 29 year old. It is not intended to diagnose infection nor to guide or monitor treatment.   Aerobic/Anaerobic Culture (surgical/deep wound)     Status: None (Preliminary result)   Collection Time: 06/10/16  8:50 PM  Result Value Ref Range Status   Specimen Description ABSCESS LEFT AXILLA  Final   Special Requests PATIENT ON FOLLOWING ZOSYN  Final   Gram  Stain   Final    ABUNDANT WBC PRESENT,BOTH PMN AND MONONUCLEAR ABUNDANT GRAM POSITIVE COCCI IN CLUSTERS IN PAIRS Performed at [facility]    Culture   Final    ABUNDANT STAPHYLOCOCCUS AUREUS NO ANAEROBES ISOLATED; CULTURE IN PROGRESS FOR 5 DAYS    Report Status PENDING  Incomplete  Aerobic Culture (superficial specimen)     Status: None   Collection Time: 06/11/16 12:31 PM  Result Value Ref Range Status   Specimen Description AXILLA LEFT  Final   Special Requests NONE  Final   Gram Stain   Final    NO WBC SEEN ABUNDANT GRAM POSITIVE COCCI IN PAIRS IN CLUSTERS Performed at [facility]    Culture   Final    ABUNDANT METHICILLIN RESISTANT STAPHYLOCOCCUS AUREUS   Report Status 06/14/2016 FINAL  Final   Organism ID, Bacteria METHICILLIN RESISTANT STAPHYLOCOCCUS AUREUS  Final      Susceptibility   Methicillin resistant staphylococcus aureus - MIC*    CIPROFLOXACIN >=8 RESISTANT Resistant     ERYTHROMYCIN >=8 RESISTANT Resistant     GENTAMICIN <=0.5 SENSITIVE Sensitive     OXACILLIN >=4 RESISTANT Resistant     TETRACYCLINE <=1 SENSITIVE Sensitive     VANCOMYCIN <=0.5 SENSITIVE Sensitive     TRIMETH/SULFA <=10 SENSITIVE Sensitive     CLINDAMYCIN <=0.25 SENSITIVE Sensitive     RIFAMPIN <=0.5 SENSITIVE Sensitive  Inducible Clindamycin NEGATIVE Sensitive     * ABUNDANT METHICILLIN RESISTANT STAPHYLOCOCCUS AUREUS     Studies/Results: Dg Chest Port 1 View  Result Date: 06/13/2016 CLINICAL DATA:  Shortness of breath. EXAM: PORTABLE CHEST 1 VIEW COMPARISON:  06/11/2016 FINDINGS: Mild cardiac enlargement. Bilateral pleural effusions and moderate interstitial edema is identified compatible with CHF. IMPRESSION: Moderate CHF. Electronically Signed   By: Kerby Moors M.D.   On: 06/13/2016 12:37    Anti-infectives: Anti-infectives    Start     Dose/Rate Route Frequency Ordered Stop   06/15/16 0600  vancomycin (VANCOCIN) 1,250 mg in sodium chloride 0.9 % 250 mL IVPB      1,250 mg 166.7 mL/hr over 90 Minutes Intravenous Every 12 hours 06/15/16 0534     06/12/16 1800  vancomycin (VANCOCIN) IVPB 1000 mg/200 mL premix  Status:  Discontinued     1,000 mg 200 mL/hr over 60 Minutes Intravenous Every 12 hours 06/12/16 1241 06/15/16 0534   06/11/16 1400  piperacillin-tazobactam (ZOSYN) IVPB 3.375 g  Status:  Discontinued     3.375 g 100 mL/hr over 30 Minutes Intravenous Every 8 hours 06/11/16 1246 06/11/16 1247   06/11/16 1400  piperacillin-tazobactam (ZOSYN) IVPB 3.375 g     3.375 g 12.5 mL/hr over 240 Minutes Intravenous Every 8 hours 06/11/16 1249     06/11/16 1330  vancomycin (VANCOCIN) IVPB 750 mg/150 ml premix  Status:  Discontinued     750 mg 150 mL/hr over 60 Minutes Intravenous Every 8 hours 06/11/16 1305 06/12/16 1241   06/10/16 1900  piperacillin-tazobactam (ZOSYN) IVPB 3.375 g  Status:  Discontinued     3.375 g 12.5 mL/hr over 240 Minutes Intravenous Every 8 hours 06/10/16 1805 06/10/16 2207      Assessment/Plan: s/p Procedure(s): INCISION AND DRAINAGE LEFT AXILLARY ABSCESS (Left) -Dressing changes q8h WTD -MRSA per micro  LOS: 5 days    Rosario Jacks., Anne Hahn 06/15/2016

## 2016-06-15 NOTE — Progress Notes (Signed)
PROGRESS NOTE    RICAHRD SCHWAGER  ZOX:[id number] DOB: 05-Aug-1961 DOA: 06/10/2016 PCP: Kennyth Arnold, FNP    Brief Narrative: [name] is a 55 year old male with history of diabetes mellitus, type II, hypertension, hyperlipidemia who was admitted for incision and drainage of left axilla abscess. Patient developed acute hypoxic respiratory failure on 8/24 and was found to have an NSTEMI, elevation in troponin with associated new Q waves in inferior leads initially without chest pain. Patient was started on heparin and cardiology consulted for management, started on BIPAP and transferred to [facility] now for cardiac cath  Assessment & Plan:   Acute Hypoxic resp failure -due to MI/CHF and aspiration pneumonia -improving -s/p IV Zosyn Day 4, change to PO levaquin today for 51moe days -nebs PRN -Weaned off BIPAP -FU CXR today  NSTEMI - ? Post op silent MI -troponin trending down from peak of16 -ECHO with EF of 25% and significant WMA -Akinesis of the basal-midinferolateral and inferior myocardium -Cards following -continue ASA/Metoprolol, increase dose and IV heparin -plan for LHC ? tomorrow  Acute systolic CHF -due to MI, s/p IV lasix -will diurese with 2 doses IV lasix today -urine output not recorded, strict I/Os ordered  MRSA L axillar abscess -s/p I&D 8/23 by Dr.Toth -Cx with MRSA, on IV Vanc D5, d/w CCS regarding FU -wound care  Afib with RVR -back on cardizem gtt again this was weaned off yesterday after pause then resumed due to recurrence of AFib RVR -increase metoprolol dose and wean off Cardizem -Cards following -on IV heparin for NSTEMI  Essential Hypertension Controlled -Lisinopril held on admission, resumed today per Cards  Diabetes mellitus, type 2 Uncontrolled, was not taking any meds >1year, HbA1c is 12.2 -Sliding-scale insulin -Continue Lantus 10 units subcutaneous  Hypercholesterolemia Patient on simvastatin. Will likely need to transition to  atorvastatin.  Tobacco abuse -counseled  DVT prophylaxis: Heparin full dose  Code Status: Full code  Family Communication: none at bedside  Disposition Plan: Keep in SDU  Consultants:   Cardiology  Pulmonology   Procedures:   Echocardiogram (8/25)  Antimicrobials:  None    Subjective: No chest pain, breathing better  Objective: Vitals:   06/15/16 0600 06/15/16 0753 06/15/16 0958 06/15/16 1108  BP: 112/71 97/63 121/75   Pulse: 71 72 79   Resp: 18 18    Temp:  98.1 F (36.7 C)  98.3 F (36.8 C)  TempSrc:  Oral  Oral  SpO2: 96% 96%    Weight:      Height:        Intake/Output Summary (Last 24 hours) at 06/15/16 1138 Last data filed at 06/15/16 0600  Gross per 24 hour  Intake             1632 ml  Output                0 ml  Net             1632 ml   Filed Weights   06/10/16 1812 06/12/16 1330  Weight: 73.3 kg (161 lb 9.6 oz) 73.2 kg (161 lb 6 oz)    Examination:  General exam: AAOx3, on VM, no distress Respiratory system: Mild bibasilar crackles. Respiratory effort normal. Cardiovascular system: S1 & S2 heard, RRR. 2/6 midsystolic ejection murmur. No rubs, gallops or clicks. No pedal edema. Gastrointestinal system: Abdomen is nondistended, soft and nontender. No organomegaly or masses felt. Normal bowel sounds heard. Central nervous system: Alert and oriented. No focal neurological deficits.  Extremities: Symmetric 5 x 5 power, dressing in L axilla, purulent discharge noted Skin: No rashes, lesions or ulcers Psychiatry: Judgement and insight appear normal. Mood & affect appropriate.     Data Reviewed: I have personally reviewed following labs and imaging studies  CBC:  Recent Labs Lab 06/11/16 1723 06/12/16 0152 06/13/16 0321 06/14/16 0325 06/15/16 0420  WBC 17.7* 18.8* 13.6* 9.7 11.5*  HGB 14.4 14.5 12.8* 12.7* 13.7  HCT 42.0 42.2 38.2* 38.7* 41.2  MCV 82.7 82.7 85.1 85.8 84.6  PLT 152 141* 133* 140* 174   Basic Metabolic  Panel:  Recent Labs Lab 06/10/16 1845 06/11/16 1723 06/12/16 0152 06/15/16 0745  NA 137 130* 134* 137  K 3.4* 3.6 3.8 2.9*  CL 101 92* 96* 97*  CO2 '28 26 27 ' 34*  GLUCOSE 215* 354* 186* 155*  BUN 15 28* 30* 16  CREATININE 0.76 1.37* 1.19 0.95  CALCIUM 8.6* 8.2* 8.4* 8.0*   GFR: Estimated Creatinine Clearance: 79.3 mL/min (by C-G formula based on SCr of 0.95 mg/dL). Liver Function Tests:  Recent Labs Lab 06/10/16 1845 06/11/16 1723  AST 19 62*  ALT 25 31  ALKPHOS 87 84  BILITOT 0.7 1.1  PROT 7.4 7.2  ALBUMIN 3.8 3.5   No results for input(s): LIPASE, AMYLASE in the last 168 hours. No results for input(s): AMMONIA in the last 168 hours. Coagulation Profile:  Recent Labs Lab 06/10/16 1845  INR 1.00   Cardiac Enzymes:  Recent Labs Lab 06/11/16 1723 06/11/16 2320 06/12/16 0721 06/13/16 1610 06/14/16 0325  TROPONINI 5.90* 16.06* 10.36* 3.63* 3.20*   BNP (last 3 results) No results for input(s): PROBNP in the last 8760 hours. HbA1C: No results for input(s): HGBA1C in the last 72 hours. CBG:  Recent Labs Lab 06/14/16 2023 06/15/16 0038 06/15/16 0437 06/15/16 0752 06/15/16 1107  GLUCAP 233* 210* 176* 167* 205*   Lipid Profile: No results for input(s): CHOL, HDL, LDLCALC, TRIG, CHOLHDL, LDLDIRECT in the last 72 hours. Thyroid Function Tests: No results for input(s): TSH, T4TOTAL, FREET4, T3FREE, THYROIDAB in the last 72 hours. Anemia Panel: No results for input(s): VITAMINB12, FOLATE, FERRITIN, TIBC, IRON, RETICCTPCT in the last 72 hours. Sepsis Labs:  Recent Labs Lab 06/12/16 0721 06/13/16 0321 06/14/16 0325  PROCALCITON 0.89 0.66 0.45    Recent Results (from the past 240 hour(s))  Surgical pcr screen     Status: None   Collection Time: 06/10/16  6:39 PM  Result Value Ref Range Status   MRSA, PCR NEGATIVE NEGATIVE Final   Staphylococcus aureus NEGATIVE NEGATIVE Final    Comment:        The Xpert SA Assay (FDA approved for NASAL  specimens in patients over 42 years of age), is one component of a comprehensive surveillance program.  Test performance has been validated by [facility] for patients greater than or equal to 35 year old. It is not intended to diagnose infection nor to guide or monitor treatment.   Aerobic/Anaerobic Culture (surgical/deep wound)     Status: None (Preliminary result)   Collection Time: 06/10/16  8:50 PM  Result Value Ref Range Status   Specimen Description ABSCESS LEFT AXILLA  Final   Special Requests PATIENT ON FOLLOWING ZOSYN  Final   Gram Stain   Final    ABUNDANT WBC PRESENT,BOTH PMN AND MONONUCLEAR ABUNDANT GRAM POSITIVE COCCI IN CLUSTERS IN PAIRS Performed at [facility]    Culture   Final    ABUNDANT STAPHYLOCOCCUS AUREUS NO ANAEROBES ISOLATED; CULTURE  IN PROGRESS FOR 5 DAYS    Report Status PENDING  Incomplete  Aerobic Culture (superficial specimen)     Status: None   Collection Time: 06/11/16 12:31 PM  Result Value Ref Range Status   Specimen Description AXILLA LEFT  Final   Special Requests NONE  Final   Gram Stain   Final    NO WBC SEEN ABUNDANT GRAM POSITIVE COCCI IN PAIRS IN CLUSTERS Performed at [facility]    Culture   Final    ABUNDANT METHICILLIN RESISTANT STAPHYLOCOCCUS AUREUS   Report Status 06/14/2016 FINAL  Final   Organism ID, Bacteria METHICILLIN RESISTANT STAPHYLOCOCCUS AUREUS  Final      Susceptibility   Methicillin resistant staphylococcus aureus - MIC*    CIPROFLOXACIN >=8 RESISTANT Resistant     ERYTHROMYCIN >=8 RESISTANT Resistant     GENTAMICIN <=0.5 SENSITIVE Sensitive     OXACILLIN >=4 RESISTANT Resistant     TETRACYCLINE <=1 SENSITIVE Sensitive     VANCOMYCIN <=0.5 SENSITIVE Sensitive     TRIMETH/SULFA <=10 SENSITIVE Sensitive     CLINDAMYCIN <=0.25 SENSITIVE Sensitive     RIFAMPIN <=0.5 SENSITIVE Sensitive     Inducible Clindamycin NEGATIVE Sensitive     * ABUNDANT METHICILLIN RESISTANT STAPHYLOCOCCUS AUREUS          Radiology Studies: Dg Chest Port 1 View  Result Date: 06/13/2016 CLINICAL DATA:  Shortness of breath. EXAM: PORTABLE CHEST 1 VIEW COMPARISON:  06/11/2016 FINDINGS: Mild cardiac enlargement. Bilateral pleural effusions and moderate interstitial edema is identified compatible with CHF. IMPRESSION: Moderate CHF. Electronically Signed   By: Kerby Moors M.D.   On: 06/13/2016 12:37        Scheduled Meds: . aspirin  81 mg Oral Daily  . atorvastatin  80 mg Oral q1800  . buPROPion  150 mg Oral Daily  . chlorhexidine  15 mL Mouth Rinse BID  . insulin aspart  0-9 Units Subcutaneous Q4H  . insulin glargine  10 Units Subcutaneous Daily  . insulin starter kit- pen needles  1 kit Other Once  . lisinopril  2.5 mg Oral Daily  . living well with diabetes book   Does not apply Once  . mouth rinse  15 mL Mouth Rinse q12n4p  . metoprolol tartrate  50 mg Oral BID  . nicotine  7 mg Transdermal Daily  . piperacillin-tazobactam (ZOSYN)  IV  3.375 g Intravenous Q8H  . potassium chloride  40 mEq Oral Q2H  . vancomycin  1,250 mg Intravenous Q12H   Continuous Infusions: . sodium chloride 10 mL/hr at 06/15/16 0500  . heparin 1,700 Units/hr (06/15/16 0957)     LOS: 5 days     Domenic Polite, MD [facility] 06/15/2016, 11:38 AM Pager: [phone]  If 7PM-7AM, please contact night-coverage [email or web address] Password [facility] 06/15/2016, 11:38 AM

## 2016-06-15 NOTE — Progress Notes (Addendum)
Inpatient Diabetes Program Recommendations  AACE/ADA: New Consensus Statement on Inpatient Glycemic Control (2015)  Target Ranges:  Prepandial:   less than 140 mg/dL      Peak postprandial:   less than 180 mg/dL (1-2 hours)      Critically ill patients:  140 - 180 mg/dL   Results for Baisch, Farouk F (MRN [id number]) as of 06/15/2016 10:32  Ref. Range 06/14/2016 08:48 06/14/2016 11:58 06/14/2016 17:00 06/14/2016 20:23 06/15/2016 00:38 06/15/2016 04:37 06/15/2016 07:52  Glucose-Capillary Latest Ref Range: 65 - 99 mg/dL 161 (H) 238 (H) 272 (H) 233 (H) 210 (H) 176 (H) 167 (H)    Review of Glycemic Control  Inpatient Diabetes Program Recommendations:  Please consider increasing Lantus to 15 units daily (0.2 units/kg) and add Novolog 3-4 units meal coverage tid if eats 50% meal, change Novolog correction to tid with meals with hs correction 0-5 units. Noted A1c 12.2. Nurses to educate patient on insulin injections as appropriate. Spoke with RN Pat Stramoski regarding education plan. Will order insulin starter kit. Spoke with patient @ bedside regarding medication compliance and risks with elevated A1c. Patient states willingness to take insulin @ home as needed.  Thank you, Judy E. Hanks, RN, MSN, CDE Inpatient Glycemic Control Team Team Pager #[phone] (8am-5pm) 06/15/2016 10:39 AM     

## 2016-06-15 NOTE — Progress Notes (Signed)
 TELEMETRY: Reviewed telemetry pt in NSR, he had brief episodes of Afib with RVR last night. IV cardizem resumed. No post termination pauses: Vitals:   06/15/16 0300 06/15/16 0600 06/15/16 0753 06/15/16 0958  BP: 105/74 112/71 97/63 121/75  Pulse: 70 71 72 79  Resp: 15 18 18   Temp: (!) 96.8 F (36 C)  98.1 F (36.7 C)   TempSrc: Axillary  Oral   SpO2: 95% 96% 96%   Weight:      Height:        Intake/Output Summary (Last 24 hours) at 06/15/16 1051 Last data filed at 06/15/16 0600  Gross per 24 hour  Intake             1632 ml  Output                0 ml  Net             1632 ml   Filed Weights   06/10/16 1812 06/12/16 1330  Weight: 161 lb 9.6 oz (73.3 kg) 161 lb 6 oz (73.2 kg)    Subjective  Feels OK this am. Not aware of arrhythmia. No chest pain or SOB currently.  . aspirin  81 mg Oral Daily  . atorvastatin  80 mg Oral q1800  . buPROPion  150 mg Oral Daily  . chlorhexidine  15 mL Mouth Rinse BID  . insulin aspart  0-9 Units Subcutaneous Q4H  . insulin glargine  10 Units Subcutaneous Daily  . insulin starter kit- pen needles  1 kit Other Once  . lisinopril  2.5 mg Oral Daily  . living well with diabetes book   Does not apply Once  . mouth rinse  15 mL Mouth Rinse q12n4p  . metoprolol tartrate  50 mg Oral BID  . nicotine  7 mg Transdermal Daily  . piperacillin-tazobactam (ZOSYN)  IV  3.375 g Intravenous Q8H  . potassium chloride  40 mEq Oral Q2H  . vancomycin  1,250 mg Intravenous Q12H   . sodium chloride 10 mL/hr at 06/15/16 0500  . heparin 1,700 Units/hr (06/15/16 0957)    LABS: Basic Metabolic Panel:  Recent Labs  06/15/16 0745  NA 137  K 2.9*  CL 97*  CO2 34*  GLUCOSE 155*  BUN 16  CREATININE 0.95  CALCIUM 8.0*   Liver Function Tests: No results for input(s): AST, ALT, ALKPHOS, BILITOT, PROT, ALBUMIN in the last 72 hours. No results for input(s): LIPASE, AMYLASE in the last 72 hours. CBC:  Recent Labs  06/14/16 0325 06/15/16 0420  WBC  9.7 11.5*  HGB 12.7* 13.7  HCT 38.7* 41.2  MCV 85.8 84.6  PLT 140* 158   Cardiac Enzymes:  Recent Labs  06/13/16 1610 06/14/16 0325  TROPONINI 3.63* 3.20*   BNP: No results for input(s): PROBNP in the last 72 hours. D-Dimer: No results for input(s): DDIMER in the last 72 hours. Hemoglobin A1C: No results for input(s): HGBA1C in the last 72 hours. Fasting Lipid Panel: No results for input(s): CHOL, HDL, LDLCALC, TRIG, CHOLHDL, LDLDIRECT in the last 72 hours. Thyroid Function Tests: No results for input(s): TSH, T4TOTAL, T3FREE, THYROIDAB in the last 72 hours.  Invalid input(s): FREET3   Radiology/Studies:  Dg Chest Port 1 View  Result Date: 06/13/2016 CLINICAL DATA:  Shortness of breath. EXAM: PORTABLE CHEST 1 VIEW COMPARISON:  06/11/2016 FINDINGS: Mild cardiac enlargement. Bilateral pleural effusions and moderate interstitial edema is identified compatible with CHF. IMPRESSION: Moderate CHF. Electronically Signed     By: Kerby Moors M.D.   On: 06/13/2016 12:37   Echo: 06/12/16:Study Conclusions  - Left ventricle: The cavity size was normal. There was mild   concentric hypertrophy. Systolic function was severely reduced.   The estimated ejection fraction was in the range of 25% to 30%.   Akinesis of the basal-midinferolateral and inferior myocardium.   Severe hypokinesis of the lateral myocardium. ; consistent with   infarction in the distribution of the right coronary and left   circumflex coronary artery. Features are consistent with a   pseudonormal left ventricular filling pattern, with concomitant   abnormal relaxation and increased filling pressure (grade 2   diastolic dysfunction). - Mitral valve: There was mild regurgitation. - Pericardium, extracardiac: A trivial pericardial effusion was   identified.  PHYSICAL EXAM General: Well developed, well nourished, in no acute distress. Head: Normocephalic, atraumatic, sclera non-icteric, oropharynx is clear with  poor dentition Neck: Negative for carotid bruits. JVD not elevated. No adenopathy Lungs: Clear bilaterally to auscultation without wheezes, rales, or rhonchi. Breathing is unlabored. Heart: RRR S1 S2 without murmurs, rubs, or gallops.  Abdomen: Soft, non-tender, non-distended with normoactive bowel sounds. No hepatomegaly. No rebound/guarding. No obvious abdominal masses. Left axilla with surgical dressing. Extremities: No clubbing, cyanosis or edema.  Distal pedal pulses are 2+ and equal bilaterally. Neuro: Alert and oriented X 3. Moves all extremities spontaneously. Psych:  Responds to questions appropriately with a normal affect.  ASSESSMENT AND PLAN: 1. Paroxysmal AFib with RVR. Previously had 6.5 second post termination pause. Digoxin stopped and on metoprolol 25 mg bid and low dose Cardizem. Last night IV cardizem resumed and patient converted. Given low EF I would recommend increasing metoprolol to 50 mg bid and stopping Cardizem. If he continues to have frequent afib may need to consider amiodarone. Currently on IV heparin. Will need long term anticoagulation once invasive procedures complete. 2. NSTEMI. Peak troponin 16.06. Ecg with minimal ST depression laterally. EF is markedly reduced. Plan Cardiac cath +/- PCI tomorrow.  3. Ischemic cardiomyopathy. EF 25-30% by Echo with regional wall motion abnormality. Will increase metoprolol. Start low dose ACEi now since renal function normalized. LHC tomorrow.  4. Acute hypoxic respiratory failure secondary to PNA (by CT)/aspiration and/or CHF. Clinically improved 5. Left axillary abscess. S/p surgical drainage. 6. DM on insulin 7. HTN 8. Tobacco abuse. Counseled on cessation. 9. Hyperlipidemia on high dose statin.  Present on Admission: . Abscess of axilla, left . Pure hypercholesterolemia . Type 2 diabetes mellitus, uncontrolled ([place]) . Essential hypertension . Acute respiratory failure with hypoxia ([place])   Signed, Ersel Wadleigh [place],  [place] 06/15/2016 10:51 AM

## 2016-06-15 NOTE — Progress Notes (Signed)
Pharmacy Antibiotic Note  [name] is a 55 y.o. male admitted on 06/10/2016 with MRSA wound infection s/p I&D left axillary abscess 8/23, now with PNA on chest CT 8/24. Pt continues on Vancomycin and Zosyn (Day #5).  Vancomycin trough 14 mcg/ml (slightly subtherapeutic) on 1gm IV q12h.  Plan: Adjust vancomycin to 1250mg  q12h. No dose adjustment to Zosyn. Continue Zosyn 3.375g IV q8h (4 hour infusion time).  Follow renal function, cultures, clinical course. Will also check BMET in a.m. (since last done 8/25)  Height: 5\' 6"  (167.6 cm) Weight: 161 lb 6 oz (73.2 kg) IBW/kg (Calculated) : 63.8  Temp (24hrs), Avg:97.4 F (36.3 C), Min:96.7 F (35.9 C), Max:98.2 F (36.8 C)   Recent Labs Lab 06/10/16 1845 06/11/16 1723 06/12/16 0152 06/13/16 0321 06/14/16 0325 06/15/16 0420  WBC 11.3* 17.7* 18.8* 13.6* 9.7 11.5*  CREATININE 0.76 1.37* 1.19  --   --   --   VANCOTROUGH  --   --   --   --   --  14*    Estimated Creatinine Clearance: 63.3 mL/min (by C-G formula based on SCr of 1.19 mg/dL).    No Known Allergies  Antimicrobials this admission: 8/24 vanc >> 8/24 zosyn >>  Dose adjustments this admission: 8/25 adj vanc 750mg  q8h to 1g q12h for incr SCr 8/28 adj vanc 1000mg  q12h to 1250mg  q12h  Microbiology results: 8/24 left axilla cx: MRSA 8/24 MRSA PCR: negative  Thank you for allowing pharmacy to be a part of this patient's care.  Sherlon Handing, PharmD, BCPS Clinical pharmacist, pager [phone] 06/15/2016 5:32 AM

## 2016-06-15 NOTE — Progress Notes (Signed)
ANTICOAGULATION CONSULT NOTE - f/u Consult  Pharmacy Consult for Heparin Indication: chest pain/ACS  No Known Allergies  Patient Measurements: Height: 5\' 6"  (167.6 cm) Weight: 161 lb 6 oz (73.2 kg) IBW/kg (Calculated) : 63.8 Heparin Dosing Weight: 73 kg  Vital Signs: Temp: 98.1 F (36.7 C) (08/28 0753) Temp Source: Oral (08/28 0753) BP: 97/63 (08/28 0753) Pulse Rate: 72 (08/28 0753)  Labs:  Recent Labs  06/13/16 0321  06/13/16 1610 06/14/16 0325 06/14/16 1213 06/15/16 0420  HGB 12.8*  --   --  12.7*  --  13.7  HCT 38.2*  --   --  38.7*  --  41.2  PLT 133*  --   --  140*  --  158  HEPARINUNFRC  --   < >  --  0.23* 0.41 0.43  TROPONINI  --   --  3.63* 3.20*  --   --   < > = values in this interval not displayed.  Estimated Creatinine Clearance: 63.3 mL/min (by C-G formula based on SCr of 1.19 mg/dL).  Assessment: 55yoM on heparin for possible NSTEMI. Heparin level at goal. CBC stable. No issues with line or bleeding reported per RN.  Goal of Therapy:  Heparin level 0.3-0.7 units/ml Monitor platelets by anticoagulation protocol: Yes   Plan: Continue heparin 1700 uts/hr  Daily HL, CBC   Melburn Popper, PharmD Clinical Pharmacy Resident Pager: [phone] 06/15/16 7:54 AM

## 2016-06-15 NOTE — Progress Notes (Signed)
Leaders Free II Informed Consent   Subject Name: [name]. Reece  Subject met inclusion and exclusion criteria.  The informed consent form, study requirements and expectations were reviewed with the subject and questions and concerns were addressed prior to the signing of the consent form.  The subject verbalized understanding of the trail requirements.  The subject agreed to participate in the Leaders Free II trial and signed the informed consent.  The informed consent was obtained prior to performance of any protocol-specific procedures for the subject.  A copy of the signed informed consent was given to the subject and a copy was placed in the subject's medical record.  [name] 06/15/2016, 1720 PM

## 2016-06-15 NOTE — Progress Notes (Signed)
Patient currently on Venturi Mask. States his breathing is good. No distress noted.  Encouraged patient to call for Respiratory if any increased WOB or SOB.

## 2016-06-16 ENCOUNTER — Other Ambulatory Visit: Payer: Self-pay | Admitting: *Deleted

## 2016-06-16 ENCOUNTER — Encounter (HOSPITAL_COMMUNITY)
Admission: AD | Disposition: A | Discharge status: Home / Self Care | Payer: Self-pay | Source: Ambulatory Visit | Attending: Internal Medicine

## 2016-06-16 ENCOUNTER — Encounter (HOSPITAL_COMMUNITY): Payer: Self-pay | Admitting: Cardiovascular Disease

## 2016-06-16 DIAGNOSIS — I2510 Atherosclerotic heart disease of native coronary artery without angina pectoris: Secondary | ICD-10-CM

## 2016-06-16 DIAGNOSIS — I480 Paroxysmal atrial fibrillation: Secondary | ICD-10-CM

## 2016-06-16 HISTORY — PX: CARDIAC CATHETERIZATION: SHX172

## 2016-06-16 LAB — CBC
HEMATOCRIT: 40.8 % (ref 39.0–52.0)
HEMOGLOBIN: 13.5 g/dL (ref 13.0–17.0)
MCH: 28.3 pg (ref 26.0–34.0)
MCHC: 33.1 g/dL (ref 30.0–36.0)
MCV: 85.5 fL (ref 78.0–100.0)
Platelets: 177 10*3/uL (ref 150–400)
RBC: 4.77 MIL/uL (ref 4.22–5.81)
RDW: 12.7 % (ref 11.5–15.5)
WBC: 13.2 10*3/uL — AB (ref 4.0–10.5)

## 2016-06-16 LAB — GLUCOSE, CAPILLARY
GLUCOSE-CAPILLARY: 122 mg/dL — AB (ref 65–99)
GLUCOSE-CAPILLARY: 122 mg/dL — AB (ref 65–99)
GLUCOSE-CAPILLARY: 175 mg/dL — AB (ref 65–99)
Glucose-Capillary: 135 mg/dL — ABNORMAL HIGH (ref 65–99)
Glucose-Capillary: 171 mg/dL — ABNORMAL HIGH (ref 65–99)
Glucose-Capillary: 205 mg/dL — ABNORMAL HIGH (ref 65–99)

## 2016-06-16 LAB — BASIC METABOLIC PANEL
ANION GAP: 10 (ref 5–15)
BUN: 16 mg/dL (ref 6–20)
CHLORIDE: 96 mmol/L — AB (ref 101–111)
CO2: 33 mmol/L — AB (ref 22–32)
Calcium: 8.4 mg/dL — ABNORMAL LOW (ref 8.9–10.3)
Creatinine, Ser: 0.88 mg/dL (ref 0.61–1.24)
GFR calc non Af Amer: >60 mL/min (ref >60)
Glucose, Bld: 134 mg/dL — ABNORMAL HIGH (ref 65–99)
Potassium: 3.8 mmol/L (ref 3.5–5.1)
SODIUM: 139 mmol/L (ref 135–145)

## 2016-06-16 LAB — MAGNESIUM: MAGNESIUM: 1.6 mg/dL — AB (ref 1.7–2.4)

## 2016-06-16 LAB — HEPARIN LEVEL (UNFRACTIONATED): Heparin Unfractionated: 0.7 IU/mL (ref 0.30–0.70)

## 2016-06-16 SURGERY — LEFT HEART CATH AND CORONARY ANGIOGRAPHY

## 2016-06-16 MED ORDER — SODIUM CHLORIDE 0.9 % IV SOLN: 250.0000 mL | INTRAVENOUS | Status: DC | PRN

## 2016-06-16 MED ORDER — VERAPAMIL HCL 2.5 MG/ML IV SOLN
INTRAVENOUS | Status: DC | PRN
  Administered 2016-06-16: 10 mL via INTRA_ARTERIAL

## 2016-06-16 MED ORDER — ASPIRIN 81 MG PO CHEW: 81.0000 mg | CHEWABLE_TABLET | Freq: Every day | ORAL | Status: DC

## 2016-06-16 MED ORDER — HEPARIN (PORCINE) IN NACL 100-0.45 UNIT/ML-% IJ SOLN
1600.0000 [IU]/h | INTRAMUSCULAR | Status: DC
  Administered 2016-06-16: 1600 [IU]/h via INTRAVENOUS
  Filled 2016-06-16: qty 250

## 2016-06-16 MED ORDER — ZOLPIDEM TARTRATE 5 MG PO TABS: 5.0000 mg | ORAL_TABLET | Freq: Every evening | ORAL | Status: DC | PRN

## 2016-06-16 MED ORDER — IOPAMIDOL (ISOVUE-370) INJECTION 76%
INTRAVENOUS | Status: AC
  Filled 2016-06-16: qty 100

## 2016-06-16 MED ORDER — VERAPAMIL HCL 2.5 MG/ML IV SOLN
INTRAVENOUS | Status: AC
  Filled 2016-06-16: qty 2

## 2016-06-16 MED ORDER — MAGNESIUM SULFATE 2 GM/50ML IV SOLN
2.0000 g | Freq: Once | INTRAVENOUS | Status: AC
  Administered 2016-06-16: 2 g via INTRAVENOUS
  Filled 2016-06-16: qty 50

## 2016-06-16 MED ORDER — LIDOCAINE HCL (PF) 1 % IJ SOLN
INTRAMUSCULAR | Status: DC | PRN
  Administered 2016-06-16: 2 mL via INTRADERMAL

## 2016-06-16 MED ORDER — INSULIN ASPART 100 UNIT/ML ~~LOC~~ SOLN
0.0000 [IU] | Freq: Three times a day (TID) | SUBCUTANEOUS | Status: DC
  Administered 2016-06-17 (×3): 2 [IU] via SUBCUTANEOUS
  Administered 2016-06-18: 3 [IU] via SUBCUTANEOUS
  Administered 2016-06-18: 5 [IU] via SUBCUTANEOUS
  Administered 2016-06-18: 3 [IU] via SUBCUTANEOUS
  Administered 2016-06-19: 2 [IU] via SUBCUTANEOUS
  Administered 2016-06-19: 3 [IU] via SUBCUTANEOUS

## 2016-06-16 MED ORDER — ACETAMINOPHEN 325 MG PO TABS: 650.0000 mg | ORAL_TABLET | ORAL | Status: DC | PRN

## 2016-06-16 MED ORDER — HEPARIN SODIUM (PORCINE) 1000 UNIT/ML IJ SOLN
INTRAMUSCULAR | Status: AC
  Filled 2016-06-16: qty 1

## 2016-06-16 MED ORDER — ONDANSETRON HCL 4 MG/2ML IJ SOLN: 4.0000 mg | Freq: Four times a day (QID) | INTRAMUSCULAR | Status: DC | PRN

## 2016-06-16 MED ORDER — FENTANYL CITRATE (PF) 100 MCG/2ML IJ SOLN
INTRAMUSCULAR | Status: DC | PRN
  Administered 2016-06-16: 25 ug via INTRAVENOUS

## 2016-06-16 MED ORDER — MIDAZOLAM HCL 2 MG/2ML IJ SOLN
INTRAMUSCULAR | Status: DC | PRN
  Administered 2016-06-16: 2 mg via INTRAVENOUS

## 2016-06-16 MED ORDER — SODIUM CHLORIDE 0.9% FLUSH: 3.0000 mL | INTRAVENOUS | Status: DC | PRN

## 2016-06-16 MED ORDER — HEPARIN (PORCINE) IN NACL 2-0.9 UNIT/ML-% IJ SOLN
INTRAMUSCULAR | Status: AC
  Filled 2016-06-16: qty 1000

## 2016-06-16 MED ORDER — FENTANYL CITRATE (PF) 100 MCG/2ML IJ SOLN
INTRAMUSCULAR | Status: AC
  Filled 2016-06-16: qty 2

## 2016-06-16 MED ORDER — IOPAMIDOL (ISOVUE-370) INJECTION 76%
INTRAVENOUS | Status: DC | PRN
  Administered 2016-06-16: 70 mL via INTRA_ARTERIAL

## 2016-06-16 MED ORDER — LIDOCAINE HCL (PF) 1 % IJ SOLN
INTRAMUSCULAR | Status: AC
  Filled 2016-06-16: qty 30

## 2016-06-16 MED ORDER — ALPRAZOLAM 0.25 MG PO TABS
0.2500 mg | ORAL_TABLET | Freq: Two times a day (BID) | ORAL | Status: DC | PRN
  Administered 2016-06-16: 0.25 mg via ORAL
  Filled 2016-06-16: qty 1

## 2016-06-16 MED ORDER — SODIUM CHLORIDE 0.9% FLUSH
3.0000 mL | Freq: Two times a day (BID) | INTRAVENOUS | Status: DC
  Administered 2016-06-17 – 2016-06-19 (×4): 3 mL via INTRAVENOUS

## 2016-06-16 MED ORDER — HEPARIN SODIUM (PORCINE) 1000 UNIT/ML IJ SOLN
INTRAMUSCULAR | Status: DC | PRN
  Administered 2016-06-16: 4000 [IU] via INTRAVENOUS

## 2016-06-16 MED ORDER — SODIUM CHLORIDE 0.9 % IV SOLN: INTRAVENOUS | Status: DC

## 2016-06-16 MED ORDER — MIDAZOLAM HCL 2 MG/2ML IJ SOLN
INTRAMUSCULAR | Status: AC
  Filled 2016-06-16: qty 2

## 2016-06-16 MED ORDER — HEPARIN (PORCINE) IN NACL 2-0.9 UNIT/ML-% IJ SOLN
INTRAMUSCULAR | Status: DC | PRN
  Administered 2016-06-16: 1000 mL

## 2016-06-16 SURGICAL SUPPLY — 8 items
CATH IMPULSE 5F ANG/FL3.5 (CATHETERS) ×3 IMPLANT
DEVICE RAD COMP TR BAND LRG (VASCULAR PRODUCTS) ×3 IMPLANT
GLIDESHEATH SLEND SS 6F .021 (SHEATH) ×3 IMPLANT
KIT HEART LEFT (KITS) ×3 IMPLANT
PACK CARDIAC CATHETERIZATION (CUSTOM PROCEDURE TRAY) ×3 IMPLANT
TRANSDUCER W/STOPCOCK (MISCELLANEOUS) ×3 IMPLANT
TUBING CIL FLEX 10 FLL-RA (TUBING) ×3 IMPLANT
WIRE SAFE-T 1.5MM-J .035X260CM (WIRE) ×3 IMPLANT

## 2016-06-16 NOTE — Progress Notes (Signed)
k Pad applied to left shoulder.

## 2016-06-16 NOTE — Progress Notes (Signed)
ANTICOAGULATION CONSULT NOTE - f/u Consult  Pharmacy Consult for Heparin Indication: chest pain/ACS  Patient Measurements: Height: 5\' 6"  (167.6 cm) Weight: 158 lb 15.2 oz (72.1 kg) IBW/kg (Calculated) : 63.8 Heparin Dosing Weight: 73 kg  Vital Signs: Temp: 97.7 F (36.5 C) (08/29 1115) Temp Source: Oral (08/29 1115) BP: 103/71 (08/29 1115) Pulse Rate: 70 (08/29 1115)  Labs:  Recent Labs  06/13/16 1610  06/14/16 0325 06/14/16 1213 06/15/16 0420 06/15/16 0745 06/16/16 0404  HGB  --   < > 12.7*  --  13.7  --  13.5  HCT  --   --  38.7*  --  41.2  --  40.8  PLT  --   --  140*  --  158  --  177  HEPARINUNFRC  --   < > 0.23* 0.41 0.43  --  0.70  CREATININE  --   --   --   --   --  0.95 0.88  TROPONINI 3.63*  --  3.20*  --   --   --   --   < > = values in this interval not displayed.  Estimated Creatinine Clearance: 85.6 mL/min (by C-G formula based on SCr of 0.88 mg/dL).  Assessment: 38 yoM on heparin for possible NSTEMI with HL at 0.7 (the upper end of goal) on 1700 units/hr this morning. Patient taken to cath 8/29 and found to have extensive severe multivessel disease - now awaiting surgical consult for possible CABG. Heparin to be resumed 8 hours post sheath removal (8/23 ~1300). CBC stable. No issues with line or bleeding reported per RN.  Goal of Therapy:  Heparin level 0.3-0.7 units/ml Monitor platelets by anticoagulation protocol: Yes   Plan: Restart heparin at 1600 uts/hr  ~6 hour HL with morning labs Daily HL, CBC  Melburn Popper, PharmD Clinical Pharmacy Resident Pager: [phone] 06/16/16 2:20 PM

## 2016-06-16 NOTE — Progress Notes (Signed)
ANTICOAGULATION CONSULT NOTE - f/u Consult  Pharmacy Consult for Heparin Indication: chest pain/ACS  No Known Allergies  Patient Measurements: Height: 5\' 6"  (167.6 cm) Weight: 158 lb 15.2 oz (72.1 kg) IBW/kg (Calculated) : 63.8 Heparin Dosing Weight: 73 kg  Vital Signs: Temp: 97.8 F (36.6 C) (08/29 0732) Temp Source: Oral (08/29 0732) BP: 116/73 (08/29 0732) Pulse Rate: 72 (08/29 0732)  Labs:  Recent Labs  06/13/16 1610  06/14/16 0325 06/14/16 1213 06/15/16 0420 06/15/16 0745 06/16/16 0404  HGB  --   < > 12.7*  --  13.7  --  13.5  HCT  --   --  38.7*  --  41.2  --  40.8  PLT  --   --  140*  --  158  --  177  HEPARINUNFRC  --   < > 0.23* 0.41 0.43  --  0.70  CREATININE  --   --   --   --   --  0.95 0.88  TROPONINI 3.63*  --  3.20*  --   --   --   --   < > = values in this interval not displayed.  Estimated Creatinine Clearance: 85.6 mL/min (by C-G formula based on SCr of 0.88 mg/dL).  Assessment: 55yoM on heparin for possible NSTEMI. Heparin level at goal. CBC stable. No issues with line or bleeding reported per RN. Patient with plans to go to cath today.  Goal of Therapy:  Heparin level 0.3-0.7 units/ml Monitor platelets by anticoagulation protocol: Yes   Plan: Continue heparin 1700 uts/hr  Daily HL, CBC Follow up [facility] plans post cath, per Dr. [place] note is a candidate for [place], PharmD Clinical Pharmacy Resident Pager: [phone] 06/16/16 9:12 AM

## 2016-06-16 NOTE — Progress Notes (Signed)
6 Days Post-Op  Subjective: Pt doing well with no acute events  Objective: Vital signs in last 24 hours: Temp:  [97.7 F (36.5 C)-98.9 F (37.2 C)] 97.8 F (36.6 C) (08/29 0732) Pulse Rate:  [65-94] 72 (08/29 0732) Resp:  [14-25] 16 (08/29 0732) BP: (97-129)/(63-84) 116/73 (08/29 0732) SpO2:  [93 %-100 %] 95 % (08/29 0732) FiO2 (%):  [55 %-100 %] 55 % (08/28 2000) Weight:  [72.1 kg (158 lb 15.2 oz)] 72.1 kg (158 lb 15.2 oz) (08/29 0400) Last BM Date: 06/15/16  Intake/Output from previous day: 08/28 0701 - 08/29 0700 In: 1842.7 [P.O.:840; I.V.:502.7; IV Piggyback:500] Out: 2700 [Urine:2700] Intake/Output this shift: No intake/output data recorded.  wound c/d/i, min erythema  Lab Results:   Recent Labs  06/15/16 0420 06/16/16 0404  WBC 11.5* 13.2*  HGB 13.7 13.5  HCT 41.2 40.8  PLT 158 177   BMET  Recent Labs  06/15/16 0745 06/16/16 0404  NA 137 139  K 2.9* 3.8  CL 97* 96*  CO2 34* 33*  GLUCOSE 155* 134*  BUN 16 16  CREATININE 0.95 0.88  CALCIUM 8.0* 8.4*   PT/INR No results for input(s): LABPROT, INR in the last 72 hours. ABG No results for input(s): PHART, HCO3 in the last 72 hours.  Invalid input(s): PCO2, PO2  Studies/Results: Dg Chest Port 1 View  Result Date: 06/15/2016 CLINICAL DATA:  Shortness of breath. Cough. Hypoxia. Acute myocardial infarction. EXAM: PORTABLE CHEST 1 VIEW COMPARISON:  Chest x-ray dated 06/13/2016 FINDINGS: There is persistent bilateral pulmonary edema with Kerley B-lines at the bases. Small left pleural effusion. Heart size is normal. Slight pulmonary vascular congestion. No acute bone abnormality. Arthritic changes of the right shoulder with evidence consistent with chronic rotator cuff disease. IMPRESSION: Persistent bilateral pulmonary edema with increased small left effusion. Electronically Signed   By: Francene Boyers M.D.   On: 06/15/2016 13:31    Anti-infectives: Anti-infectives    Start     Dose/Rate Route Frequency  Ordered Stop   06/15/16 1200  levofloxacin (LEVAQUIN) tablet 500 mg     500 mg Oral Daily 06/15/16 1141     06/15/16 0600  vancomycin (VANCOCIN) 1,250 mg in sodium chloride 0.9 % 250 mL IVPB     1,250 mg 166.7 mL/hr over 90 Minutes Intravenous Every 12 hours 06/15/16 0534     06/12/16 1800  vancomycin (VANCOCIN) IVPB 1000 mg/200 mL premix  Status:  Discontinued     1,000 mg 200 mL/hr over 60 Minutes Intravenous Every 12 hours 06/12/16 1241 06/15/16 0534   06/11/16 1400  piperacillin-tazobactam (ZOSYN) IVPB 3.375 g  Status:  Discontinued     3.375 g 100 mL/hr over 30 Minutes Intravenous Every 8 hours 06/11/16 1246 06/11/16 1247   06/11/16 1400  piperacillin-tazobactam (ZOSYN) IVPB 3.375 g  Status:  Discontinued     3.375 g 12.5 mL/hr over 240 Minutes Intravenous Every 8 hours 06/11/16 1249 06/15/16 1141   06/11/16 1330  vancomycin (VANCOCIN) IVPB 750 mg/150 ml premix  Status:  Discontinued     750 mg 150 mL/hr over 60 Minutes Intravenous Every 8 hours 06/11/16 1305 06/12/16 1241   06/10/16 1900  piperacillin-tazobactam (ZOSYN) IVPB 3.375 g  Status:  Discontinued     3.375 g 12.5 mL/hr over 240 Minutes Intravenous Every 8 hours 06/10/16 1805 06/10/16 2207      Assessment/Plan: s/p Procedure(s): INCISION AND DRAINAGE LEFT AXILLARY ABSCESS (Left)  con't wound care Narrow abx per primary team following  LOS: 6 days  Rosario Jacks., Anne Hahn 06/16/2016

## 2016-06-16 NOTE — Progress Notes (Signed)
PROGRESS NOTE    [name]  [id number] DOB: 15-Jun-1961 DOA: 06/10/2016 PCP: Kennyth Arnold, FNP    Brief Narrative: [name] is a 55 year old male with history of diabetes mellitus, type II, hypertension, hyperlipidemia who was admitted for incision and drainage of left axilla abscess. Patient developed acute hypoxic respiratory failure on 8/24 and was found to have an NSTEMI, elevation in troponin with associated new Q waves in inferior leads initially without chest pain. Patient was started on heparin and cardiology consulted for management, started on BIPAP and transferred to [facility] from [facility] for cardiac cath Since transfer, had AFib RVR, requiring cardizem drip off and on, limited by pauses and RVR. On diuretics, Cards following for LHC today  Assessment & Plan:   Acute Hypoxic resp failure -due to MI/CHF and aspiration pneumonia and suspected underlying COPD(>50ppd smoker) -improving -s/p IV Zosyn Day 4, changed to PO levaquin , needs 1 more day -Weaned off BIPAP -FU CXR with CHF, diuretics restarted, I/o inaccurate , urine output recorded sporadically -suspect he will need Home O2 due to undiagnosed COPD  NSTEMI - ? Post op silent MI -troponin trending down from peak of16 -ECHO with EF of 25% and significant WMA -Akinesis of the basal-midinferolateral and inferior myocardium -Cards following -continue ASA/Metoprolol, increased dose and IV heparin -plan for LHC today  Acute systolic CHF -due to MI, on diuretics with IV lasix, I/o inaccurate -hold lasix today for cath  MRSA L axillar abscess -s/p I&D 8/23 by Dr.Toth -Cx with MRSA, on IV Vanc D6, d/w CCS regarding FU -wound care -? Duration of Abx per CCS  Afib with RVR -back on cardizem gtt again this was weaned off 8/27 after pause then resumed due to recurrence of AFib RVR -increase metoprolol dose and wean off Cardizem -Cards following -on IV heparin for NSTEMI  Essential  Hypertension Controlled -Lisinopril held on admission, resumed today per Cards  Diabetes mellitus, type 2 Uncontrolled, was not taking any meds >1year, HbA1c is 12.2 -Sliding-scale insulin -Continue Lantus 10 units subcutaneous, CBGS 122-135 today  Hypercholesterolemia -Simvastatin changed to atorvastatin.  Tobacco abuse -counseled  DVT prophylaxis: Heparin full dose  Code Status: Full code  Family Communication: none at bedside today Disposition Plan: Keep in SDU  Consultants:   Cardiology  Pulmonology   Procedures:   Echocardiogram (8/25)  Antimicrobials:  None    Subjective: No chest pain, breathing better, wants to get cath over with  Objective: Vitals:   06/16/16 0400 06/16/16 0500 06/16/16 0732 06/16/16 1115  BP:  105/84 116/73 103/71  Pulse:  65 72 70  Resp:  14 16 12   Temp: 97.8 F (36.6 C)  97.8 F (36.6 C) 97.7 F (36.5 C)  TempSrc: Oral  Oral Oral  SpO2:  96% 95% 96%  Weight: 72.1 kg (158 lb 15.2 oz)     Height:        Intake/Output Summary (Last 24 hours) at 06/16/16 1152 Last data filed at 06/16/16 0700  Gross per 24 hour  Intake          1514.67 ml  Output             2700 ml  Net         -1185.33 ml   Filed Weights   06/10/16 1812 06/12/16 1330 06/16/16 0400  Weight: 73.3 kg (161 lb 9.6 oz) 73.2 kg (161 lb 6 oz) 72.1 kg (158 lb 15.2 oz)    Examination:  General exam: AAOx3, on  VM, no distress Respiratory system: Mild bibasilar crackles. Respiratory effort normal. Cardiovascular system: S1 & S2 heard, RRR. 2/6 midsystolic ejection murmur. No rubs, gallops or clicks. No pedal edema. Gastrointestinal system: Abdomen is nondistended, soft and nontender. No organomegaly or masses felt. Normal bowel sounds heard. Central nervous system: Alert and oriented. No focal neurological deficits. Extremities: Symmetric 5 x 5 power, dressing in L axilla, purulent discharge noted Skin: No rashes, lesions or ulcers Psychiatry: Judgement and  insight appear normal. Mood & affect appropriate.     Data Reviewed: I have personally reviewed following labs and imaging studies  CBC:  Recent Labs Lab 06/12/16 0152 06/13/16 0321 06/14/16 0325 06/15/16 0420 06/16/16 0404  WBC 18.8* 13.6* 9.7 11.5* 13.2*  HGB 14.5 12.8* 12.7* 13.7 13.5  HCT 42.2 38.2* 38.7* 41.2 40.8  MCV 82.7 85.1 85.8 84.6 85.5  PLT 141* 133* 140* 158 [id number]   Basic Metabolic Panel:  Recent Labs Lab 06/10/16 1845 06/11/16 1723 06/12/16 0152 06/15/16 0745 06/16/16 0404  NA 137 130* 134* 137 139  K 3.4* 3.6 3.8 2.9* 3.8  CL 101 92* 96* 97* 96*  CO2 28 26 27  34* 33*  GLUCOSE 215* 354* 186* 155* 134*  BUN 15 28* 30* 16 16  CREATININE 0.76 1.37* 1.19 0.95 0.88  CALCIUM 8.6* 8.2* 8.4* 8.0* 8.4*  MG  --   --   --   --  1.6*   GFR: Estimated Creatinine Clearance: 85.6 mL/min (by C-G formula based on SCr of 0.88 mg/dL). Liver Function Tests:  Recent Labs Lab 06/10/16 1845 06/11/16 1723  AST 19 62*  ALT 25 31  ALKPHOS 87 84  BILITOT 0.7 1.1  PROT 7.4 7.2  ALBUMIN 3.8 3.5   No results for input(s): LIPASE, AMYLASE in the last 168 hours. No results for input(s): AMMONIA in the last 168 hours. Coagulation Profile:  Recent Labs Lab 06/10/16 1845  INR 1.00   Cardiac Enzymes:  Recent Labs Lab 06/11/16 1723 06/11/16 2320 06/12/16 0721 06/13/16 1610 06/14/16 0325  TROPONINI 5.90* 16.06* 10.36* 3.63* 3.20*   BNP (last 3 results) No results for input(s): PROBNP in the last 8760 hours. HbA1C: No results for input(s): HGBA1C in the last 72 hours. CBG:  Recent Labs Lab 06/15/16 2237 06/16/16 0003 06/16/16 0406 06/16/16 0726 06/16/16 1108  GLUCAP 187* 171* 135* 122* 122*   Lipid Profile: No results for input(s): CHOL, HDL, LDLCALC, TRIG, CHOLHDL, LDLDIRECT in the last 72 hours. Thyroid Function Tests: No results for input(s): TSH, T4TOTAL, FREET4, T3FREE, THYROIDAB in the last 72 hours. Anemia Panel: No results for input(s):  VITAMINB12, FOLATE, FERRITIN, TIBC, IRON, RETICCTPCT in the last 72 hours. Sepsis Labs:  Recent Labs Lab 06/12/16 0721 06/13/16 0321 06/14/16 0325  PROCALCITON 0.89 0.66 0.45    Recent Results (from the past 240 hour(s))  Surgical pcr screen     Status: None   Collection Time: 06/10/16  6:39 PM  Result Value Ref Range Status   MRSA, PCR NEGATIVE NEGATIVE Final   Staphylococcus aureus NEGATIVE NEGATIVE Final    Comment:        The Xpert SA Assay (FDA approved for NASAL specimens in patients over 49 years of age), is one component of a comprehensive surveillance program.  Test performance has been validated by [facility] for patients greater than or equal to 76 year old. It is not intended to diagnose infection nor to guide or monitor treatment.   Aerobic/Anaerobic Culture (surgical/deep wound)     Status:  None   Collection Time: 06/10/16  8:50 PM  Result Value Ref Range Status   Specimen Description ABSCESS LEFT AXILLA  Final   Special Requests PATIENT ON FOLLOWING ZOSYN  Final   Gram Stain   Final    ABUNDANT WBC PRESENT,BOTH PMN AND MONONUCLEAR ABUNDANT GRAM POSITIVE COCCI IN CLUSTERS IN PAIRS    Culture   Final    ABUNDANT STAPHYLOCOCCUS AUREUS NO ANAEROBES ISOLATED Performed at [facility]    Report Status 06/15/2016 FINAL  Final  Aerobic Culture (superficial specimen)     Status: None   Collection Time: 06/11/16 12:31 PM  Result Value Ref Range Status   Specimen Description AXILLA LEFT  Final   Special Requests NONE  Final   Gram Stain   Final    NO WBC SEEN ABUNDANT GRAM POSITIVE COCCI IN PAIRS IN CLUSTERS Performed at [facility]    Culture   Final    ABUNDANT METHICILLIN RESISTANT STAPHYLOCOCCUS AUREUS   Report Status 06/14/2016 FINAL  Final   Organism ID, Bacteria METHICILLIN RESISTANT STAPHYLOCOCCUS AUREUS  Final      Susceptibility   Methicillin resistant staphylococcus aureus - MIC*    CIPROFLOXACIN >=8 RESISTANT Resistant      ERYTHROMYCIN >=8 RESISTANT Resistant     GENTAMICIN <=0.5 SENSITIVE Sensitive     OXACILLIN >=4 RESISTANT Resistant     TETRACYCLINE <=1 SENSITIVE Sensitive     VANCOMYCIN <=0.5 SENSITIVE Sensitive     TRIMETH/SULFA <=10 SENSITIVE Sensitive     CLINDAMYCIN <=0.25 SENSITIVE Sensitive     RIFAMPIN <=0.5 SENSITIVE Sensitive     Inducible Clindamycin NEGATIVE Sensitive     * ABUNDANT METHICILLIN RESISTANT STAPHYLOCOCCUS AUREUS         Radiology Studies: Dg Chest Port 1 View  Result Date: 06/15/2016 CLINICAL DATA:  Shortness of breath. Cough. Hypoxia. Acute myocardial infarction. EXAM: PORTABLE CHEST 1 VIEW COMPARISON:  Chest x-ray dated 06/13/2016 FINDINGS: There is persistent bilateral pulmonary edema with Kerley B-lines at the bases. Small left pleural effusion. Heart size is normal. Slight pulmonary vascular congestion. No acute bone abnormality. Arthritic changes of the right shoulder with evidence consistent with chronic rotator cuff disease. IMPRESSION: Persistent bilateral pulmonary edema with increased small left effusion. Electronically Signed   By: Lorriane Shire M.D.   On: 06/15/2016 13:31        Scheduled Meds: . aspirin  81 mg Oral Daily  . atorvastatin  80 mg Oral q1800  . buPROPion  150 mg Oral Daily  . insulin aspart  0-9 Units Subcutaneous Q4H  . insulin glargine  10 Units Subcutaneous Daily  . levofloxacin  500 mg Oral Daily  . lisinopril  2.5 mg Oral Daily  . mouth rinse  15 mL Mouth Rinse BID  . metoprolol tartrate  50 mg Oral BID  . nicotine  7 mg Transdermal Daily  . potassium chloride  40 mEq Oral BID  . sodium chloride flush  3 mL Intravenous Q12H  . vancomycin  1,250 mg Intravenous Q12H   Continuous Infusions: . sodium chloride Stopped (06/15/16 0700)  . sodium chloride Stopped (06/16/16 [id number])  . heparin 1,700 Units/hr (06/16/16 0017)     LOS: 6 days     Domenic Polite, MD [facility] 06/16/2016, 11:52 AM Pager: [phone]  If  7PM-7AM, please contact night-coverage [email or web address] Password [facility] 06/16/2016, 11:52 AM

## 2016-06-16 NOTE — Progress Notes (Signed)
Inpatient Diabetes Program Recommendations  AACE/ADA: New Consensus Statement on Inpatient Glycemic Control (2015)  Target Ranges:  Prepandial:   less than 140 mg/dL      Peak postprandial:   less than 180 mg/dL (1-2 hours)      Critically ill patients:  140 - 180 mg/dL   Results for BERNON, SURTI (MRN [id number]) as of 06/16/2016 09:45  Ref. Range 06/15/2016 20:21 06/15/2016 22:37 06/16/2016 00:03 06/16/2016 04:06 06/16/2016 07:26  Glucose-Capillary Latest Ref Range: 65 - 99 mg/dL 244 (H) 187 (H) 171 (H) 135 (H) 122 (H)    Review of Glycemic Control  Diabetes history: DM2 Outpatient Diabetes medications: Has not been taking prescribed DM meds Current orders for Inpatient glycemic control: Lantus 10 units qhs + Novolog correction 0-9 tid q4hrs  Inpatient Diabetes Program Recommendations:  Received consult regarding rec for home medications.  For Home Meds: please consider Lantus 15 units q hs (has received Novolog correction q 4 hrs=12 units in 24 hrs) and add Metformin on discharge. Hospital: Increase Lantus to 15 units and add meal coverage tid Novolog 3 units if eats 50%.  Thank you, Nani Gasser. Yulian Gosney, RN, MSN, CDE Inpatient Glycemic Control Team Team Pager [phone] (8am-5pm) 06/16/2016 9:58 AM

## 2016-06-16 NOTE — Care Management Note (Signed)
Case Management Note  Patient Details  Name: [name] MRN: [id number] Date of Birth: [birth year]  Subjective/Objective:         Adm w axilla I&d, mi           Action/Plan: lives w wife   Expected Discharge Date:                  Expected Discharge Plan:  [place]  In-House Referral:     Discharge planning Services  CM Consult  Post Acute Care Choice:  Home Health Choice offered to:  Spouse  DME Arranged:    DME Agency:     HH Arranged:  RN [place] Agency:  [place]  Status of Service:     If discussed at [place] of Stay Meetings, dates discussed:    CM submitted benefit check for insulins as consulted- unable to complete - yielded no prescription coverage.  CM spoke pt; pt states he has prescription coverage and will obtain card from wife this evening.  CM will follow up  Additional Comments: spoke w wife and went over hhc agency list. [facility]. Ref to donna w [facility] for hhrn for dssg changes at disch.  Maryclare Labrador, RN 06/16/2016, 4:18 PM

## 2016-06-16 NOTE — Progress Notes (Signed)
TELEMETRY: Reviewed telemetry pt in NSR, Vitals:   06/16/16 0000 06/16/16 0400 06/16/16 0500 06/16/16 0732  BP: 110/72  105/84 116/73  Pulse: 80  65 72  Resp: (!) 21  14 16   Temp: 97.8 F (36.6 C) 97.8 F (36.6 C)  97.8 F (36.6 C)  TempSrc: Oral Oral  Oral  SpO2: 100%  96% 95%  Weight:  158 lb 15.2 oz (72.1 kg)    Height:        Intake/Output Summary (Last 24 hours) at 06/16/16 0849 Last data filed at 06/16/16 0700  Gross per 24 hour  Intake          1820.67 ml  Output             2700 ml  Net          -879.33 ml   Filed Weights   06/10/16 1812 06/12/16 1330 06/16/16 0400  Weight: 161 lb 9.6 oz (73.3 kg) 161 lb 6 oz (73.2 kg) 158 lb 15.2 oz (72.1 kg)    Subjective  Feels OK this am. No chest pain or SOB currently.  Marland Kitchen aspirin  81 mg Oral Daily  . atorvastatin  80 mg Oral q1800  . buPROPion  150 mg Oral Daily  . insulin aspart  0-9 Units Subcutaneous Q4H  . insulin glargine  10 Units Subcutaneous Daily  . levofloxacin  500 mg Oral Daily  . lisinopril  2.5 mg Oral Daily  . magnesium sulfate 1 - 4 g bolus IVPB  2 g Intravenous Once  . mouth rinse  15 mL Mouth Rinse BID  . metoprolol tartrate  50 mg Oral BID  . nicotine  7 mg Transdermal Daily  . potassium chloride  40 mEq Oral BID  . sodium chloride flush  3 mL Intravenous Q12H  . vancomycin  1,250 mg Intravenous Q12H   . sodium chloride Stopped (06/15/16 0700)  . sodium chloride Stopped (06/16/16 [id number])  . heparin 1,700 Units/hr (06/16/16 0017)    LABS: Basic Metabolic Panel:  Recent Labs  06/15/16 0745 06/16/16 0404  NA 137 139  K 2.9* 3.8  CL 97* 96*  CO2 34* 33*  GLUCOSE 155* 134*  BUN 16 16  CREATININE 0.95 0.88  CALCIUM 8.0* 8.4*  MG  --  1.6*   Liver Function Tests: No results for input(s): AST, ALT, ALKPHOS, BILITOT, PROT, ALBUMIN in the last 72 hours. No results for input(s): LIPASE, AMYLASE in the last 72 hours. CBC:  Recent Labs  06/15/16 0420 06/16/16 0404  WBC 11.5* 13.2*  HGB  13.7 13.5  HCT 41.2 40.8  MCV 84.6 85.5  PLT 158 177   Cardiac Enzymes:  Recent Labs  06/13/16 1610 06/14/16 0325  TROPONINI 3.63* 3.20*   BNP: No results for input(s): PROBNP in the last 72 hours. D-Dimer: No results for input(s): DDIMER in the last 72 hours. Hemoglobin A1C: No results for input(s): HGBA1C in the last 72 hours. Fasting Lipid Panel: No results for input(s): CHOL, HDL, LDLCALC, TRIG, CHOLHDL, LDLDIRECT in the last 72 hours. Thyroid Function Tests: No results for input(s): TSH, T4TOTAL, T3FREE, THYROIDAB in the last 72 hours.  Invalid input(s): FREET3   Radiology/Studies:  Dg Chest Port 1 View  Result Date: 06/15/2016 CLINICAL DATA:  Shortness of breath. Cough. Hypoxia. Acute myocardial infarction. EXAM: PORTABLE CHEST 1 VIEW COMPARISON:  Chest x-ray dated 06/13/2016 FINDINGS: There is persistent bilateral pulmonary edema with Kerley B-lines at the bases. Small left pleural effusion. Heart size is  normal. Slight pulmonary vascular congestion. No acute bone abnormality. Arthritic changes of the right shoulder with evidence consistent with chronic rotator cuff disease. IMPRESSION: Persistent bilateral pulmonary edema with increased small left effusion. Electronically Signed   By: Lorriane Shire M.D.   On: 06/15/2016 13:31   Echo: 06/12/16:Study Conclusions  - Left ventricle: The cavity size was normal. There was mild   concentric hypertrophy. Systolic function was severely reduced.   The estimated ejection fraction was in the range of 25% to 30%.   Akinesis of the basal-midinferolateral and inferior myocardium.   Severe hypokinesis of the lateral myocardium. ; consistent with   infarction in the distribution of the right coronary and left   circumflex coronary artery. Features are consistent with a   pseudonormal left ventricular filling pattern, with concomitant   abnormal relaxation and increased filling pressure (grade 2   diastolic dysfunction). - Mitral  valve: There was mild regurgitation. - Pericardium, extracardiac: A trivial pericardial effusion was   identified.  PHYSICAL EXAM General: Well developed, well nourished, in no acute distress. Head: Normocephalic, atraumatic, sclera non-icteric, oropharynx is clear with poor dentition Neck: Negative for carotid bruits. JVD not elevated. No adenopathy Lungs: Bilateral coarse BS and crackles.  Breathing is unlabored. Heart: RRR S1 S2 without murmurs, rubs, or gallops.  Abdomen: Soft, non-tender, non-distended with normoactive bowel sounds. No hepatomegaly. No rebound/guarding. No obvious abdominal masses. Left axilla with surgical dressing. Extremities: No clubbing, cyanosis or edema.  Distal pedal pulses are 2+ and equal bilaterally. Neuro: Alert and oriented X 3. Moves all extremities spontaneously. Psych:  Responds to questions appropriately with a normal affect.  ASSESSMENT AND PLAN: 1. Paroxysmal AFib with RVR. Previously had 6.5 second post termination pause. Digoxin stopped and metoprolol increased. No Afib in over 24 hours.  If he continues to have frequent afib may need to consider amiodarone. Currently on IV heparin. Will need long term anticoagulation once invasive procedures complete. He would be a candidate for a NOAC. 2. NSTEMI. Peak troponin 16.06. Ecg with minimal ST depression laterally. EF is markedly reduced. Plan Cardiac cath +/- PCI today. 3. Ischemic cardiomyopathy. EF 25-30% by Echo with regional wall motion abnormality. Will increase metoprolol. Will titrate ACEi now since renal function normalized. Assess EDP by cath today. If elevated will need diuresis.  4. Acute hypoxic respiratory failure secondary to PNA (by CT)/aspiration and/or CHF. Clinically improved 5. Left axillary abscess. S/p surgical drainage. 6. DM on insulin 7. HTN 8. Tobacco abuse. Counseled on cessation. 9. Hyperlipidemia on high dose statin. 10. Hypomagnesemia. Repleating.   Present on  Admission: . Abscess of axilla, left . Pure hypercholesterolemia . Type 2 diabetes mellitus, uncontrolled ([place]) . Essential hypertension . Acute respiratory failure with hypoxia ([place])   Signed, Urania Pearlman [place], [place] 06/16/2016 8:49 AM

## 2016-06-16 NOTE — Interval H&P Note (Signed)
Cath Lab Visit (complete for each Cath Lab visit)  Clinical Evaluation Leading to the Procedure:   ACS: Yes.    Non-ACS:    Anginal Classification: CCS IV  Anti-ischemic medical therapy: Maximal Therapy (2 or more classes of medications)  Non-Invasive Test Results: No non-invasive testing performed  Prior CABG: No previous CABG      History and Physical Interval Note:  06/16/2016 12:30 PM  [name]  has presented today for surgery, with the diagnosis of cp  The various methods of treatment have been discussed with the patient and family. After consideration of risks, benefits and other options for treatment, the patient has consented to  Procedure(s): Left Heart Cath and Coronary Angiography (N/A) as a surgical intervention .  The patient's history has been reviewed, patient examined, no change in status, stable for surgery.  I have reviewed the patient's chart and labs.  Questions were answered to the patient's satisfaction.     Shelva Majestic

## 2016-06-16 NOTE — H&P (View-Only) (Signed)
TELEMETRY: Reviewed telemetry pt in NSR, Vitals:   06/16/16 0000 06/16/16 0400 06/16/16 0500 06/16/16 0732  BP: 110/72  105/84 116/73  Pulse: 80  65 72  Resp: (!) 21  14 16   Temp: 97.8 F (36.6 C) 97.8 F (36.6 C)  97.8 F (36.6 C)  TempSrc: Oral Oral  Oral  SpO2: 100%  96% 95%  Weight:  158 lb 15.2 oz (72.1 kg)    Height:        Intake/Output Summary (Last 24 hours) at 06/16/16 0849 Last data filed at 06/16/16 0700  Gross per 24 hour  Intake          1820.67 ml  Output             2700 ml  Net          -879.33 ml   Filed Weights   06/10/16 1812 06/12/16 1330 06/16/16 0400  Weight: 161 lb 9.6 oz (73.3 kg) 161 lb 6 oz (73.2 kg) 158 lb 15.2 oz (72.1 kg)    Subjective  Feels OK this am. No chest pain or SOB currently.  Marland Kitchen aspirin  81 mg Oral Daily  . atorvastatin  80 mg Oral q1800  . buPROPion  150 mg Oral Daily  . insulin aspart  0-9 Units Subcutaneous Q4H  . insulin glargine  10 Units Subcutaneous Daily  . levofloxacin  500 mg Oral Daily  . lisinopril  2.5 mg Oral Daily  . magnesium sulfate 1 - 4 g bolus IVPB  2 g Intravenous Once  . mouth rinse  15 mL Mouth Rinse BID  . metoprolol tartrate  50 mg Oral BID  . nicotine  7 mg Transdermal Daily  . potassium chloride  40 mEq Oral BID  . sodium chloride flush  3 mL Intravenous Q12H  . vancomycin  1,250 mg Intravenous Q12H   . sodium chloride Stopped (06/15/16 0700)  . sodium chloride Stopped (06/16/16 [id number])  . heparin 1,700 Units/hr (06/16/16 0017)    LABS: Basic Metabolic Panel:  Recent Labs  06/15/16 0745 06/16/16 0404  NA 137 139  K 2.9* 3.8  CL 97* 96*  CO2 34* 33*  GLUCOSE 155* 134*  BUN 16 16  CREATININE 0.95 0.88  CALCIUM 8.0* 8.4*  MG  --  1.6*   Liver Function Tests: No results for input(s): AST, ALT, ALKPHOS, BILITOT, PROT, ALBUMIN in the last 72 hours. No results for input(s): LIPASE, AMYLASE in the last 72 hours. CBC:  Recent Labs  06/15/16 0420 06/16/16 0404  WBC 11.5* 13.2*  HGB  13.7 13.5  HCT 41.2 40.8  MCV 84.6 85.5  PLT 158 177   Cardiac Enzymes:  Recent Labs  06/13/16 1610 06/14/16 0325  TROPONINI 3.63* 3.20*   BNP: No results for input(s): PROBNP in the last 72 hours. D-Dimer: No results for input(s): DDIMER in the last 72 hours. Hemoglobin A1C: No results for input(s): HGBA1C in the last 72 hours. Fasting Lipid Panel: No results for input(s): CHOL, HDL, LDLCALC, TRIG, CHOLHDL, LDLDIRECT in the last 72 hours. Thyroid Function Tests: No results for input(s): TSH, T4TOTAL, T3FREE, THYROIDAB in the last 72 hours.  Invalid input(s): FREET3   Radiology/Studies:  Dg Chest Port 1 View  Result Date: 06/15/2016 CLINICAL DATA:  Shortness of breath. Cough. Hypoxia. Acute myocardial infarction. EXAM: PORTABLE CHEST 1 VIEW COMPARISON:  Chest x-ray dated 06/13/2016 FINDINGS: There is persistent bilateral pulmonary edema with Kerley B-lines at the bases. Small left pleural effusion. Heart size is  normal. Slight pulmonary vascular congestion. No acute bone abnormality. Arthritic changes of the right shoulder with evidence consistent with chronic rotator cuff disease. IMPRESSION: Persistent bilateral pulmonary edema with increased small left effusion. Electronically Signed   By: Lorriane Shire M.D.   On: 06/15/2016 13:31   Echo: 06/12/16:Study Conclusions  - Left ventricle: The cavity size was normal. There was mild   concentric hypertrophy. Systolic function was severely reduced.   The estimated ejection fraction was in the range of 25% to 30%.   Akinesis of the basal-midinferolateral and inferior myocardium.   Severe hypokinesis of the lateral myocardium. ; consistent with   infarction in the distribution of the right coronary and left   circumflex coronary artery. Features are consistent with a   pseudonormal left ventricular filling pattern, with concomitant   abnormal relaxation and increased filling pressure (grade 2   diastolic dysfunction). - Mitral  valve: There was mild regurgitation. - Pericardium, extracardiac: A trivial pericardial effusion was   identified.  PHYSICAL EXAM General: Well developed, well nourished, in no acute distress. Head: Normocephalic, atraumatic, sclera non-icteric, oropharynx is clear with poor dentition Neck: Negative for carotid bruits. JVD not elevated. No adenopathy Lungs: Bilateral coarse BS and crackles.  Breathing is unlabored. Heart: RRR S1 S2 without murmurs, rubs, or gallops.  Abdomen: Soft, non-tender, non-distended with normoactive bowel sounds. No hepatomegaly. No rebound/guarding. No obvious abdominal masses. Left axilla with surgical dressing. Extremities: No clubbing, cyanosis or edema.  Distal pedal pulses are 2+ and equal bilaterally. Neuro: Alert and oriented X 3. Moves all extremities spontaneously. Psych:  Responds to questions appropriately with a normal affect.  ASSESSMENT AND PLAN: 1. Paroxysmal AFib with RVR. Previously had 6.5 second post termination pause. Digoxin stopped and metoprolol increased. No Afib in over 24 hours.  If he continues to have frequent afib may need to consider amiodarone. Currently on IV heparin. Will need long term anticoagulation once invasive procedures complete. He would be a candidate for a NOAC. 2. NSTEMI. Peak troponin 16.06. Ecg with minimal ST depression laterally. EF is markedly reduced. Plan Cardiac cath +/- PCI today. 3. Ischemic cardiomyopathy. EF 25-30% by Echo with regional wall motion abnormality. Will increase metoprolol. Will titrate ACEi now since renal function normalized. Assess EDP by cath today. If elevated will need diuresis.  4. Acute hypoxic respiratory failure secondary to PNA (by CT)/aspiration and/or CHF. Clinically improved 5. Left axillary abscess. S/p surgical drainage. 6. DM on insulin 7. HTN 8. Tobacco abuse. Counseled on cessation. 9. Hyperlipidemia on high dose statin. 10. Hypomagnesemia. Repleating.   Present on  Admission: . Abscess of axilla, left . Pure hypercholesterolemia . Type 2 diabetes mellitus, uncontrolled ([place]) . Essential hypertension . Acute respiratory failure with hypoxia ([place])   Signed, Jaeleigh [name] [place], [place] 06/16/2016 8:49 AM

## 2016-06-17 ENCOUNTER — Other Ambulatory Visit (HOSPITAL_COMMUNITY): Payer: Managed Care, Other (non HMO)

## 2016-06-17 ENCOUNTER — Inpatient Hospital Stay (HOSPITAL_COMMUNITY): Payer: BLUE CROSS/BLUE SHIELD

## 2016-06-17 DIAGNOSIS — I5023 Acute on chronic systolic (congestive) heart failure: Secondary | ICD-10-CM

## 2016-06-17 DIAGNOSIS — I480 Paroxysmal atrial fibrillation: Secondary | ICD-10-CM | POA: Diagnosis present

## 2016-06-17 DIAGNOSIS — I25110 Atherosclerotic heart disease of native coronary artery with unstable angina pectoris: Secondary | ICD-10-CM

## 2016-06-17 LAB — BASIC METABOLIC PANEL
ANION GAP: 10 (ref 5–15)
BUN: 14 mg/dL (ref 6–20)
CO2: 29 mmol/L (ref 22–32)
Calcium: 8.4 mg/dL — ABNORMAL LOW (ref 8.9–10.3)
Chloride: 100 mmol/L — ABNORMAL LOW (ref 101–111)
Creatinine, Ser: 0.87 mg/dL (ref 0.61–1.24)
GLUCOSE: 174 mg/dL — AB (ref 65–99)
Potassium: 4.1 mmol/L (ref 3.5–5.1)
Sodium: 139 mmol/L (ref 135–145)

## 2016-06-17 LAB — SPIROMETRY WITH GRAPH
FEF 25-75 Post: 1.1 L/sec
FEF 25-75 Pre: 1.22 L/sec
FEF2575-%Change-Post: -9 %
FEF2575-%Pred-Post: 38 %
FEF2575-%Pred-Pre: 42 %
FEV1-%Change-Post: -1 %
FEV1-%Pred-Post: 46 %
FEV1-%Pred-Pre: 46 %
FEV1-Post: 1.51 L
FEV1-Pre: 1.54 L
FEV1FVC-%Change-Post: 7 %
FEV1FVC-%Pred-Pre: 98 %
FEV6-%Change-Post: -8 %
FEV6-%Pred-Post: 45 %
FEV6-%Pred-Pre: 49 %
FEV6-Post: 1.85 L
FEV6-Pre: 2.02 L
FEV6FVC-%Change-Post: 0 %
FEV6FVC-%Pred-Post: 104 %
FEV6FVC-%Pred-Pre: 103 %
FVC-%Change-Post: -8 %
FVC-%Pred-Post: 43 %
FVC-%Pred-Pre: 47 %
FVC-Post: 1.86 L
FVC-Pre: 2.03 L
Post FEV1/FVC ratio: 81 %
Post FEV6/FVC ratio: 100 %
Pre FEV1/FVC ratio: 76 %
Pre FEV6/FVC Ratio: 100 %

## 2016-06-17 LAB — CBC
HCT: 41.3 % (ref 39.0–52.0)
Hemoglobin: 13.4 g/dL (ref 13.0–17.0)
MCH: 28.1 pg (ref 26.0–34.0)
MCHC: 32.4 g/dL (ref 30.0–36.0)
MCV: 86.6 fL (ref 78.0–100.0)
PLATELETS: 176 10*3/uL (ref 150–400)
RBC: 4.77 MIL/uL (ref 4.22–5.81)
RDW: 13 % (ref 11.5–15.5)
WBC: 13.9 10*3/uL — AB (ref 4.0–10.5)

## 2016-06-17 LAB — GLUCOSE, CAPILLARY
GLUCOSE-CAPILLARY: 175 mg/dL — AB (ref 65–99)
Glucose-Capillary: 168 mg/dL — ABNORMAL HIGH (ref 65–99)
Glucose-Capillary: 191 mg/dL — ABNORMAL HIGH (ref 65–99)
Glucose-Capillary: 174 mg/dL — ABNORMAL HIGH (ref 65–99)

## 2016-06-17 LAB — HEPARIN LEVEL (UNFRACTIONATED): HEPARIN UNFRACTIONATED: 0.38 [IU]/mL (ref 0.30–0.70)

## 2016-06-17 LAB — AEROBIC/ANAEROBIC CULTURE (SURGICAL/DEEP WOUND)

## 2016-06-17 LAB — VANCOMYCIN, TROUGH: VANCOMYCIN TR: 19 ug/mL (ref 15–20)

## 2016-06-17 LAB — AEROBIC/ANAEROBIC CULTURE W GRAM STAIN (SURGICAL/DEEP WOUND)

## 2016-06-17 MED ORDER — GUAIFENESIN-DM 100-10 MG/5ML PO SYRP
5.0000 mL | ORAL_SOLUTION | ORAL | Status: DC | PRN
  Administered 2016-06-17 – 2016-06-18 (×3): 5 mL via ORAL
  Filled 2016-06-17 (×4): qty 5

## 2016-06-17 MED ORDER — APIXABAN 5 MG PO TABS: 5.0000 mg | ORAL_TABLET | Freq: Two times a day (BID) | ORAL | Status: DC

## 2016-06-17 MED ORDER — APIXABAN 5 MG PO TABS
5.0000 mg | ORAL_TABLET | Freq: Two times a day (BID) | ORAL | Status: DC
  Administered 2016-06-17 – 2016-06-19 (×5): 5 mg via ORAL
  Filled 2016-06-17 (×5): qty 1

## 2016-06-17 MED ORDER — LISINOPRIL 5 MG PO TABS
5.0000 mg | ORAL_TABLET | Freq: Every day | ORAL | Status: DC
  Administered 2016-06-18 – 2016-06-19 (×2): 5 mg via ORAL
  Filled 2016-06-17 (×2): qty 1

## 2016-06-17 MED ORDER — LEVOFLOXACIN 500 MG PO TABS
500.0000 mg | ORAL_TABLET | Freq: Every day | ORAL | Status: AC
  Administered 2016-06-17: 500 mg via ORAL
  Filled 2016-06-17: qty 1

## 2016-06-17 MED ORDER — ALBUTEROL SULFATE (2.5 MG/3ML) 0.083% IN NEBU
2.5000 mg | INHALATION_SOLUTION | Freq: Once | RESPIRATORY_TRACT | Status: AC
  Administered 2016-06-17: 2.5 mg via RESPIRATORY_TRACT

## 2016-06-17 NOTE — Progress Notes (Signed)
TELEMETRY: Reviewed telemetry pt in NSR, Vitals:   06/17/16 0000 06/17/16 0345 06/17/16 0700 06/17/16 0800  BP: 115/75 109/78 98/86 115/67  Pulse: 78 78 80 79  Resp: 19 20 20 18   Temp: 97.1 F (36.2 C) 98 F (36.7 C) 98.2 F (36.8 C)   TempSrc: Oral Oral Oral   SpO2: 96% 96% 95% 97%  Weight:      Height:        Intake/Output Summary (Last 24 hours) at 06/17/16 1002 Last data filed at 06/17/16 0720  Gross per 24 hour  Intake          2964.25 ml  Output             2600 ml  Net           364.25 ml   Filed Weights   06/10/16 1812 06/12/16 1330 06/16/16 0400  Weight: 161 lb 9.6 oz (73.3 kg) 161 lb 6 oz (73.2 kg) 158 lb 15.2 oz (72.1 kg)    Subjective  Feels OK this am. No chest pain or SOB  . aspirin  81 mg Oral Daily  . atorvastatin  80 mg Oral q1800  . buPROPion  150 mg Oral Daily  . insulin aspart  0-9 Units Subcutaneous TID WC  . insulin glargine  10 Units Subcutaneous Daily  . lisinopril  2.5 mg Oral Daily  . mouth rinse  15 mL Mouth Rinse BID  . metoprolol tartrate  50 mg Oral BID  . nicotine  7 mg Transdermal Daily  . potassium chloride  40 mEq Oral BID  . sodium chloride flush  3 mL Intravenous Q12H  . vancomycin  1,250 mg Intravenous Q12H   . sodium chloride Stopped (06/17/16 0605)  . heparin 1,600 Units/hr (06/16/16 2123)    LABS: Basic Metabolic Panel:  Recent Labs  06/16/16 0404 06/17/16 0206  NA 139 139  K 3.8 4.1  CL 96* 100*  CO2 33* 29  GLUCOSE 134* 174*  BUN 16 14  CREATININE 0.88 0.87  CALCIUM 8.4* 8.4*  MG 1.6*  --    Liver Function Tests: No results for input(s): AST, ALT, ALKPHOS, BILITOT, PROT, ALBUMIN in the last 72 hours. No results for input(s): LIPASE, AMYLASE in the last 72 hours. CBC:  Recent Labs  06/16/16 0404 06/17/16 0206  WBC 13.2* 13.9*  HGB 13.5 13.4  HCT 40.8 41.3  MCV 85.5 86.6  PLT 177 176   Cardiac Enzymes: No results for input(s): CKTOTAL, CKMB, CKMBINDEX, TROPONINI in the last 72 hours. BNP: No  results for input(s): PROBNP in the last 72 hours. D-Dimer: No results for input(s): DDIMER in the last 72 hours. Hemoglobin A1C: No results for input(s): HGBA1C in the last 72 hours. Fasting Lipid Panel: No results for input(s): CHOL, HDL, LDLCALC, TRIG, CHOLHDL, LDLDIRECT in the last 72 hours. Thyroid Function Tests: No results for input(s): TSH, T4TOTAL, T3FREE, THYROIDAB in the last 72 hours.  Invalid input(s): FREET3   Radiology/Studies:  Dg Chest Port 1 View  Result Date: 06/15/2016 CLINICAL DATA:  Shortness of breath. Cough. Hypoxia. Acute myocardial infarction. EXAM: PORTABLE CHEST 1 VIEW COMPARISON:  Chest x-ray dated 06/13/2016 FINDINGS: There is persistent bilateral pulmonary edema with Kerley B-lines at the bases. Small left pleural effusion. Heart size is normal. Slight pulmonary vascular congestion. No acute bone abnormality. Arthritic changes of the right shoulder with evidence consistent with chronic rotator cuff disease. IMPRESSION: Persistent bilateral pulmonary edema with increased small left effusion. Electronically Signed  By: Lorriane Shire M.D.   On: 06/15/2016 13:31   Echo: 06/12/16:Study Conclusions  - Left ventricle: The cavity size was normal. There was mild   concentric hypertrophy. Systolic function was severely reduced.   The estimated ejection fraction was in the range of 25% to 30%.   Akinesis of the basal-midinferolateral and inferior myocardium.   Severe hypokinesis of the lateral myocardium. ; consistent with   infarction in the distribution of the right coronary and left   circumflex coronary artery. Features are consistent with a   pseudonormal left ventricular filling pattern, with concomitant   abnormal relaxation and increased filling pressure (grade 2   diastolic dysfunction). - Mitral valve: There was mild regurgitation. - Pericardium, extracardiac: A trivial pericardial effusion was   identified.  PHYSICAL EXAM General: Well developed,  well nourished, in no acute distress. Head: Normocephalic, atraumatic, sclera non-icteric, oropharynx is clear with poor dentition Neck: Negative for carotid bruits. JVD not elevated. No adenopathy Lungs: Bilateral coarse BS and crackles.  Breathing is unlabored. Heart: RRR S1 S2 without murmurs, rubs, or gallops.  Abdomen: Soft, non-tender, non-distended with normoactive bowel sounds. No hepatomegaly. No rebound/guarding. No obvious abdominal masses. Left axilla with surgical dressing. Extremities: No clubbing, cyanosis or edema.  Distal pedal pulses are 2+ and equal bilaterally. Radial cath site without hematoma. Neuro: Alert and oriented X 3. Moves all extremities spontaneously. Psych:  Responds to questions appropriately with a normal affect.  ASSESSMENT AND PLAN: 1. Paroxysmal AFib with RVR. Previously had 6.5 second post termination pause. Digoxin stopped and metoprolol increased. No Afib in over 48 hours.  If he were to have recurrent  afib may need to consider amiodarone. Currently on IV heparin. Needs oral anticoagulation. Would recommend NOAC- start Eliquis.  2. NSTEMI. Peak troponin 16.06. Ecg with minimal ST depression laterally. EF is markedly reduced. Cardiac cath showed severe 3 vessel CAD. Not a candidate for PCI. Ultimately CABG would be his best option but he is not a candidate at this time due to active infections. Plan CABG at later date- post hospitalization once clinical status improved.  3. Ischemic cardiomyopathy. EF 25-30% by Echo with regional wall motion abnormality. Will increase metoprolol. Will titrate ACEi now since renal function normalized.  EDP by cath was normal so no need for diuresis.   4. Acute hypoxic respiratory failure secondary to PNA (by CT)/aspiration and/or CHF. Clinically improved 5. Left axillary abscess. S/p surgical drainage. 6. DM on insulin 7. HTN 8. Tobacco abuse. Counseled on cessation. 9. Hyperlipidemia on high dose statin. 10.  Hypomagnesemia. Repleating.   Present on Admission: . Abscess of axilla, left . Pure hypercholesterolemia . Type 2 diabetes mellitus, uncontrolled ([place]) . Essential hypertension . Acute respiratory failure with hypoxia ([place])   Signed, Tondra Reierson [place], [place] 06/17/2016 10:02 AM

## 2016-06-17 NOTE — Progress Notes (Signed)
Pharmacy Antibiotic Note  [name] is a 55 y.o. male admitted on 06/10/2016 with MRSA wound infection s/p I&D left axillary abscess 8/23, now with PNA on chest CT 8/24. Pt continues on Vancomycin and Zosyn (D#7).  Vancomycin trough 19 mcg/ml on 1250mg  IV q12h, but drawn 1.5 hr late. Estimated true trough ~ 21 mcg/ml. Previously trough subtherapeutic on 1g Q 12. Pt is afebrile, wbc remains elevate 13.9K, scr stable, UOP good.  Plan: Continue vancomycin to 1250mg  q12h, move next dose to 2200 to avoid accumulation. Monitor renal function and f/u LOT.  Height: 5\' 6"  (167.6 cm) Weight: 158 lb 15.2 oz (72.1 kg) IBW/kg (Calculated) : 63.8  Temp (24hrs), Avg:98.2 F (36.8 C), Min:97.1 F (36.2 C), Max:99.2 F (37.3 C)   Recent Labs Lab 06/11/16 1723 06/12/16 0152 06/13/16 0321 06/14/16 0325 06/15/16 0420 06/15/16 0745 06/16/16 0404 06/17/16 0206 06/17/16 1905  WBC 17.7* 18.8* 13.6* 9.7 11.5*  --  13.2* 13.9*  --   CREATININE 1.37* 1.19  --   --   --  0.95 0.88 0.87  --   VANCOTROUGH  --   --   --   --  14*  --   --   --  19    Estimated Creatinine Clearance: 86.6 mL/min (by C-G formula based on SCr of 0.87 mg/dL).    Allergies  Allergen Reactions  . Latex Rash    Antimicrobials this admission: 8/24 vanc >> 8/24 zosyn >>  Dose adjustments this admission: 8/25 adj vanc 750mg  q8h to 1g q12h for incr SCr 8/28 adj vanc 1000mg  q12h to 1250mg  q12h  Microbiology results: 8/24 left axilla cx: MRSA 8/24 MRSA PCR: negative  Thank you for allowing pharmacy to be a part of this patient's care.  Sherlon Handing, PharmD, BCPS Clinical pharmacist, pager [phone] 06/17/2016 8:17 PM

## 2016-06-17 NOTE — Progress Notes (Signed)
[facility]                                                                              Patient Demographics  [name], is a 55 y.o. male, DOB - [birth year], [id number]  Admit date - 06/10/2016   Admitting Physician Coralie Keens, MD  Outpatient Primary MD for the patient is Kennyth Arnold, FNP  Outpatient specialists:   LOS - 7  days    No chief complaint on file.      Brief summary   [name] is a 55 year old male with history of diabetes mellitus, type II, hypertension, hyperlipidemia who was admitted for incision and drainage of left axilla abscess. Patient developed acute hypoxic respiratory failure on 8/24 and was found to have an NSTEMI, elevation in troponin with associated new Q waves in inferior leads initially without chest pain. Patient was started on heparin and cardiology consulted for management, started on BIPAP and transferred to [facility] from [facility] for cardiac cath Since transfer, had AFib RVR, requiring cardizem drip off and on, limited by pauses and RVR. Underwent cardiac cath on 8/29, recommended CABG  Assessment & Plan    Acute Hypoxic resp failure -due to MI/CHF and aspiration pneumonia and suspected underlying COPD(>50ppd smoker) -improving, weaned off BiPAP -On vancomycin and levofloxacin (completed -FU CXR with CHF, diuretics restarted -suspect he will need Home O2 due to undiagnosed COPD  NSTEMI - ? Post op silent MI -troponin trending down from peak of16 -ECHO with EF of 25% and significant WMA -Akinesis of the basal-midinferolateral and inferior myocardium - continue ASA/Metoprolol - Patient underwent cardiac cath on 8/29 which showed severe multivessel CAD with diffuse narrowing of LAD, 60-70% proximal stenosis, 50% mid and distal diffuse stenosis, total occlusion off high marginal, ramus intermediate vessel, left circumflex 85% stenosis, total occlusion of marginal branches and distal AV groove circumflex, mid  50% stenosis of small nondominant RCA. Recommended CABG -Cardiothoracic surgery consulted on the patient, seen by Dr Nils Pyle, recommended CABG at a later date, delayed due to active infections  Acute systolic CHF -d cardiology following, EDP normal by cath, so no need for diuresis per cardiology  MRSA L axillar abscess -s/p I&D 8/23 by Dr.Toth -Cx with MRSA, on IV Vanc D7 -wound care, ? Duration of Abx per [facility]  Afib with RVR - Currently stable. Previously had 6.5 second posttraumatic pauses, digoxin was stopped and metoprolol was increased, per cardiology if recurrent A. fib, may need to consider amiodarone.  - Patient currently on IV heparin, started on eliquis today  Essential Hypertension Controlled -Lisinopril held on admission, resumed by Cardiology  Diabetes mellitus, type 2 Uncontrolled, was not taking any meds >1year, HbA1c is 12.2 -Sliding-scale insulin, Continue Lantus 10 units subcutaneous  Hypercholesterolemia -Simvastatin changed to atorvastatin.  Tobacco abuse -counseled   Code Status: full  DVT Prophylaxis: eliquis Family Communication: Discussed in detail with the patient, all imaging results, lab results explained to the patient  Disposition Plan:   in sdu 1 more day, will transfer to telemetry in am  Time Spent in minutes: 36mins   Procedures:   Consultants:   Cardiothoracic  surgery Cardiology Surgery  Antimicrobials:   IV vancomycin  Levaquin complete   Medications  Scheduled Meds: . aspirin  81 mg Oral Daily  . atorvastatin  80 mg Oral q1800  . buPROPion  150 mg Oral Daily  . insulin aspart  0-9 Units Subcutaneous TID WC  . insulin glargine  10 Units Subcutaneous Daily  . [START ON 06/18/2016] lisinopril  5 mg Oral Daily  . mouth rinse  15 mL Mouth Rinse BID  . metoprolol tartrate  50 mg Oral BID  . nicotine  7 mg Transdermal Daily  . potassium chloride  40 mEq Oral BID  . sodium chloride flush  3 mL Intravenous Q12H  .  vancomycin  1,250 mg Intravenous Q12H   Continuous Infusions: . sodium chloride Stopped (06/17/16 0605)   PRN Meds:.sodium chloride, acetaminophen, albuterol, ALPRAZolam, morphine injection, nitroGLYCERIN, ondansetron (ZOFRAN) IV, ondansetron (ZOFRAN) IV, oxyCODONE-acetaminophen, promethazine, sodium chloride flush, zolpidem   Antibiotics   Anti-infectives    Start     Dose/Rate Route Frequency Ordered Stop   06/17/16 1000  levofloxacin (LEVAQUIN) tablet 500 mg     500 mg Oral Daily 06/17/16 0856 06/17/16 0906   06/15/16 1200  levofloxacin (LEVAQUIN) tablet 500 mg  Status:  Discontinued     500 mg Oral Daily 06/15/16 1141 06/17/16 0856   06/15/16 0600  vancomycin (VANCOCIN) 1,250 mg in sodium chloride 0.9 % 250 mL IVPB     1,250 mg 166.7 mL/hr over 90 Minutes Intravenous Every 12 hours 06/15/16 0534     06/12/16 1800  vancomycin (VANCOCIN) IVPB 1000 mg/200 mL premix  Status:  Discontinued     1,000 mg 200 mL/hr over 60 Minutes Intravenous Every 12 hours 06/12/16 1241 06/15/16 0534   06/11/16 1400  piperacillin-tazobactam (ZOSYN) IVPB 3.375 g  Status:  Discontinued     3.375 g 100 mL/hr over 30 Minutes Intravenous Every 8 hours 06/11/16 1246 06/11/16 1247   06/11/16 1400  piperacillin-tazobactam (ZOSYN) IVPB 3.375 g  Status:  Discontinued     3.375 g 12.5 mL/hr over 240 Minutes Intravenous Every 8 hours 06/11/16 1249 06/15/16 1141   06/11/16 1330  vancomycin (VANCOCIN) IVPB 750 mg/150 ml premix  Status:  Discontinued     750 mg 150 mL/hr over 60 Minutes Intravenous Every 8 hours 06/11/16 1305 06/12/16 1241   06/10/16 1900  piperacillin-tazobactam (ZOSYN) IVPB 3.375 g  Status:  Discontinued     3.375 g 12.5 mL/hr over 240 Minutes Intravenous Every 8 hours 06/10/16 1805 06/10/16 2207        Subjective:   [name] was seen and examined today.  Patient denies dizziness, chest pain, shortness of breath, abdominal pain, N/V/D/C, new weakness, numbess, tingling. No acute events  overnight.    Objective:   Vitals:   06/17/16 0000 06/17/16 0345 06/17/16 0700 06/17/16 0800  BP: 115/75 109/78 98/86 115/67  Pulse: 78 78 80 79  Resp: 19 20 20 18   Temp: 97.1 F (36.2 C) 98 F (36.7 C) 98.2 F (36.8 C)   TempSrc: Oral Oral Oral   SpO2: 96% 96% 95% 97%  Weight:      Height:        Intake/Output Summary (Last 24 hours) at 06/17/16 1021 Last data filed at 06/17/16 0720  Gross per 24 hour  Intake          2964.25 ml  Output             2600 ml  Net  364.25 ml     Wt Readings from Last 3 Encounters:  06/16/16 72.1 kg (158 lb 15.2 oz)  05/23/13 80.3 kg (177 lb)  09/02/12 75.3 kg (166 lb)     Exam  General: Alert and oriented x 3, NAD  HEENT:  PERRLA, EOMI, Anicteric Sclera, mucous membranes moist.   Neck: Supple, no JVD, no masses  Cardiovascular: S1 S2 auscultated, no rubs, murmurs or gallops. Regular rate and rhythm.  Respiratory: Clear to auscultation bilaterally, no wheezing, rales or rhonchi  Gastrointestinal: Soft, nontender, nondistended, + bowel sounds  Ext: no cyanosis clubbing or edema, Dressing intact on the left axilla with packing  Neuro: AAOx3, Cr N's II- XII. Strength 5/5 upper and lower extremities bilaterally  Skin: No rashes  Psych: Normal affect and demeanor, alert and oriented x3    Data Reviewed:  I have personally reviewed following labs and imaging studies  Micro Results Recent Results (from the past 240 hour(s))  Surgical pcr screen     Status: None   Collection Time: 06/10/16  6:39 PM  Result Value Ref Range Status   MRSA, PCR NEGATIVE NEGATIVE Final   Staphylococcus aureus NEGATIVE NEGATIVE Final    Comment:        The Xpert SA Assay (FDA approved for NASAL specimens in patients over 73 years of age), is one component of a comprehensive surveillance program.  Test performance has been validated by [facility] for patients greater than or equal to 52 year old. It is not intended to diagnose  infection nor to guide or monitor treatment.   Aerobic/Anaerobic Culture (surgical/deep wound)     Status: None   Collection Time: 06/10/16  8:50 PM  Result Value Ref Range Status   Specimen Description ABSCESS LEFT AXILLA  Final   Special Requests PATIENT ON FOLLOWING ZOSYN  Final   Gram Stain   Final    ABUNDANT WBC PRESENT,BOTH PMN AND MONONUCLEAR ABUNDANT GRAM POSITIVE COCCI IN CLUSTERS IN PAIRS    Culture   Final    ABUNDANT STAPHYLOCOCCUS AUREUS NO ANAEROBES ISOLATED Performed at [facility]    Report Status 06/15/2016 FINAL  Final  Aerobic Culture (superficial specimen)     Status: None   Collection Time: 06/11/16 12:31 PM  Result Value Ref Range Status   Specimen Description AXILLA LEFT  Final   Special Requests NONE  Final   Gram Stain   Final    NO WBC SEEN ABUNDANT GRAM POSITIVE COCCI IN PAIRS IN CLUSTERS Performed at [facility]    Culture   Final    ABUNDANT METHICILLIN RESISTANT STAPHYLOCOCCUS AUREUS   Report Status 06/14/2016 FINAL  Final   Organism ID, Bacteria METHICILLIN RESISTANT STAPHYLOCOCCUS AUREUS  Final      Susceptibility   Methicillin resistant staphylococcus aureus - MIC*    CIPROFLOXACIN >=8 RESISTANT Resistant     ERYTHROMYCIN >=8 RESISTANT Resistant     GENTAMICIN <=0.5 SENSITIVE Sensitive     OXACILLIN >=4 RESISTANT Resistant     TETRACYCLINE <=1 SENSITIVE Sensitive     VANCOMYCIN <=0.5 SENSITIVE Sensitive     TRIMETH/SULFA <=10 SENSITIVE Sensitive     CLINDAMYCIN <=0.25 SENSITIVE Sensitive     RIFAMPIN <=0.5 SENSITIVE Sensitive     Inducible Clindamycin NEGATIVE Sensitive     * ABUNDANT METHICILLIN RESISTANT STAPHYLOCOCCUS AUREUS    Radiology Reports Ct Angio Chest Pe W Or Wo Contrast  Result Date: 06/11/2016 CLINICAL DATA:  Lethargy, hypoxia ; status post LEFT axillary abscess incision  and drainage. History hypertension, hyperlipidemia, uncontrolled diabetes. EXAM: CT ANGIOGRAPHY CHEST WITH CONTRAST TECHNIQUE:  Multidetector CT imaging of the chest was performed using the standard protocol during bolus administration of intravenous contrast. Multiplanar CT image reconstructions and MIPs were obtained to evaluate the vascular anatomy. CONTRAST:  100 cc Isovue 370 COMPARISON:  Chest radiographs August 24th 1719 hours FINDINGS: PULMONARY ARTERY: Adequate contrast opacification of the pulmonary artery's. Main pulmonary artery is not enlarged. No pulmonary arterial filling defects to the level of the subsegmental branches. MEDIASTINUM: Heart size is mildly enlarged. No RIGHT heart strain. Scattered coronary artery calcifications. No pericardial effusion. Thoracic aorta is normal course and caliber, trace calcific atherosclerosis at the aortic arch. LUNGS: Tracheobronchial tree is patent, no pneumothorax. Dense hypo enhancing consolidation in the lower lobes bilaterally air bronchograms. SOFT TISSUES AND OSSEOUS STRUCTURES: Included view of the abdomen is unremarkable. LEFT axilla skin defect with small surrounding lymph nodes compatible with history of abscess drainage, no focal fluid collection. IMPRESSION: No acute pulmonary embolism. Bilateral lower lobe pneumonia. Status post LEFT axillary incision and drainage, no focal fluid collection. Electronically Signed   By: Elon Alas M.D.   On: 06/11/2016 18:48   Dg Chest Port 1 View  Result Date: 06/15/2016 CLINICAL DATA:  Shortness of breath. Cough. Hypoxia. Acute myocardial infarction. EXAM: PORTABLE CHEST 1 VIEW COMPARISON:  Chest x-ray dated 06/13/2016 FINDINGS: There is persistent bilateral pulmonary edema with Kerley B-lines at the bases. Small left pleural effusion. Heart size is normal. Slight pulmonary vascular congestion. No acute bone abnormality. Arthritic changes of the right shoulder with evidence consistent with chronic rotator cuff disease. IMPRESSION: Persistent bilateral pulmonary edema with increased small left effusion. Electronically Signed   By:  Lorriane Shire M.D.   On: 06/15/2016 13:31   Dg Chest Port 1 View  Result Date: 06/13/2016 CLINICAL DATA:  Shortness of breath. EXAM: PORTABLE CHEST 1 VIEW COMPARISON:  06/11/2016 FINDINGS: Mild cardiac enlargement. Bilateral pleural effusions and moderate interstitial edema is identified compatible with CHF. IMPRESSION: Moderate CHF. Electronically Signed   By: Kerby Moors M.D.   On: 06/13/2016 12:37   Dg Chest Port 1 View  Result Date: 06/11/2016 CLINICAL DATA:  Hypoxia, weakness today. History of hypertension, diabetes. EXAM: PORTABLE CHEST 1 VIEW COMPARISON:  Chest radiograph December 07, 2011 FINDINGS: The cardiac silhouette is mildly enlarged. Pulmonary vascular congestion and mild interstitial prominence without pleural effusion or focal consolidation. No pneumothorax tiny scattered granulomata. Soft tissue planes included osseous structures are nonsuspicious, high-riding RIGHT humeral head suggests old rotator cuff injury. IMPRESSION: Mild cardiomegaly. Pulmonary vascular congestion. Interstitial prominence favoring pulmonary edema, less likely atypical pneumonia. Electronically Signed   By: Elon Alas M.D.   On: 06/11/2016 17:47    Lab Data:  CBC:  Recent Labs Lab 06/13/16 0321 06/14/16 0325 06/15/16 0420 06/16/16 0404 06/17/16 0206  WBC 13.6* 9.7 11.5* 13.2* 13.9*  HGB 12.8* 12.7* 13.7 13.5 13.4  HCT 38.2* 38.7* 41.2 40.8 41.3  MCV 85.1 85.8 84.6 85.5 86.6  PLT 133* 140* 158 177 0000000   Basic Metabolic Panel:  Recent Labs Lab 06/11/16 1723 06/12/16 0152 06/15/16 0745 06/16/16 0404 06/17/16 0206  NA 130* 134* 137 139 139  K 3.6 3.8 2.9* 3.8 4.1  CL 92* 96* 97* 96* 100*  CO2 26 27 34* 33* 29  GLUCOSE 354* 186* 155* 134* 174*  BUN 28* 30* 16 16 14   CREATININE 1.37* 1.19 0.95 0.88 0.87  CALCIUM 8.2* 8.4* 8.0* 8.4* 8.4*  MG  --   --   --  1.6*  --    GFR: Estimated Creatinine Clearance: 86.6 mL/min (by C-G formula based on SCr of 0.87 mg/dL). Liver  Function Tests:  Recent Labs Lab 06/10/16 1845 06/11/16 1723  AST 19 62*  ALT 25 31  ALKPHOS 87 84  BILITOT 0.7 1.1  PROT 7.4 7.2  ALBUMIN 3.8 3.5   No results for input(s): LIPASE, AMYLASE in the last 168 hours. No results for input(s): AMMONIA in the last 168 hours. Coagulation Profile:  Recent Labs Lab 06/10/16 1845  INR 1.00   Cardiac Enzymes:  Recent Labs Lab 06/11/16 1723 06/11/16 2320 06/12/16 0721 06/13/16 1610 06/14/16 0325  TROPONINI 5.90* 16.06* 10.36* 3.63* 3.20*   BNP (last 3 results) No results for input(s): PROBNP in the last 8760 hours. HbA1C: No results for input(s): HGBA1C in the last 72 hours. CBG:  Recent Labs Lab 06/16/16 0726 06/16/16 1108 06/16/16 1725 06/16/16 2127 06/17/16 0717  GLUCAP 122* 122* 205* 175* 168*   Lipid Profile: No results for input(s): CHOL, HDL, LDLCALC, TRIG, CHOLHDL, LDLDIRECT in the last 72 hours. Thyroid Function Tests: No results for input(s): TSH, T4TOTAL, FREET4, T3FREE, THYROIDAB in the last 72 hours. Anemia Panel: No results for input(s): VITAMINB12, FOLATE, FERRITIN, TIBC, IRON, RETICCTPCT in the last 72 hours. Urine analysis:    Component Value Date/Time   COLORURINE YELLOW 12/02/2011 [place] 12/02/2011 1456   LABSPEC 1.034 (H) 12/02/2011 1456   PHURINE 5.5 12/02/2011 1456   GLUCOSEU >1000 (A) 12/02/2011 1456   HGBUR NEGATIVE 12/02/2011 1456   BILIRUBINUR NEGATIVE 12/02/2011 1456   KETONESUR NEGATIVE 12/02/2011 1456   PROTEINUR NEGATIVE 12/02/2011 1456   UROBILINOGEN 1.0 12/02/2011 1456   NITRITE NEGATIVE 12/02/2011 1456   LEUKOCYTESUR NEGATIVE 12/02/2011 1456     Antinette Keough M.D. [facility] 06/17/2016, 10:21 AM  Pager: [phone] Between 7am to 7pm - call Pager - [phone]  After 7pm go to [email or web address] - password [email or web address]  Call night coverage person covering after 7pm

## 2016-06-17 NOTE — Progress Notes (Signed)
CM received consult: New start apixaban 5mg  BID - please check cost. Benefits check in process. CM to f/u with results. Whitman Hero RN,BSN,CM [phone]

## 2016-06-17 NOTE — Progress Notes (Signed)
   Subject: RE: [facility] Benefit Check               S/W [place] @ [facility] # [id number]. LANTUS : PEN -SOILASTAS  COVER- YES  30 DAY SUPPLY - $25.00 AND 90 DAY SUPPLY- $ 62.56   2. LEVIMIR : PEN / VALVE  COVER- YES  30 DAY SUPPLY AND 90 DAY SUPPLY-$62.56   3. HUMALOG : PEN / VALVE  COVER- YES  NEED UNITS   4. NOVOLOG : PEN / VALVE  COVER- YES  NEED UNITS   PRIOR APPROVAL - NO  [facility]   Whitman Hero RN,BSN,CM [phone]

## 2016-06-17 NOTE — Progress Notes (Signed)
1 Day Post-Op Procedure(s) (LRB): Left Heart Cath and Coronary Angiography (N/A) Subjective: Patient examined and cath, echo personally reviewed, d/w patient  55 yo diabetic smoker with MRSA abscess L axilla treated with I+D Had non stemi postop, supported with BIPAP Echo- ischemic CM EF .20, no MR Cath- nondominant RCA with circ occlusion and high grade LAD - diag stenosis. LVEDP 16 Now stable after  L axilla wound personally examined and dressing changed- still with gross infection  Patient is not a candidate for CABG with active MRSA infection in upper L chest but he would benefit from CABG when infection is improved and wound cleans up. Need for  CABG is not urgent.  Will follow in hospital and as outpatient if needed rec PICCline for extended iv vanc and poss home therapies Objective: Vital signs in last 24 hours: Temp:  [97.1 F (36.2 C)-98.2 F (36.8 C)] 98.2 F (36.8 C) (08/30 0700) Pulse Rate:  [0-88] 80 (08/30 0700) Cardiac Rhythm: Normal sinus rhythm (08/30 0700) Resp:  [0-54] 20 (08/30 0700) BP: (98-121)/(71-90) 98/86 (08/30 0700) SpO2:  [0 %-100 %] 95 % (08/30 0700)  Hemodynamic parameters for last 24 hours:  nsr  Intake/Output from previous day: 08/29 0701 - 08/30 0700 In: 3165.3 [P.O.:1324; I.V.:1341.3; IV Piggyback:500] Out: 1800 [Urine:1800] Intake/Output this shift: Total I/O In: -  Out: 800 [Urine:800]      Physical Exam  General: small middle aged WM NAD HEENT: Normocephalic pupils equal , dentition poor with necrotic teeth Neck: Supple without JVD, adenopathy, or bruit Chest: Clear to auscultation, symmetrical breath sounds, no rhonchi, no tenderness  erythema around a deep open wound of L axilla with purlulence and fibrinous exudate          Cardiovascular: Regular rate and rhythm, no murmur, no gallop, peripheral pulses             palpable in all extremities Abdomen:  Soft, nontender, no palpable mass or organomegaly Extremities: Warm,  well-perfused, no clubbing cyanosis edema or tenderness,              no venous stasis changes of the legs Rectal/GU: Deferred Neuro: Grossly non--focal and symmetrical throughout Skin: Clean and dry without rash or ulceration   Lab Results:  Recent Labs  06/16/16 0404 06/17/16 0206  WBC 13.2* 13.9*  HGB 13.5 13.4  HCT 40.8 41.3  PLT 177 176   BMET:  Recent Labs  06/16/16 0404 06/17/16 0206  NA 139 139  K 3.8 4.1  CL 96* 100*  CO2 33* 29  GLUCOSE 134* 174*  BUN 16 14  CREATININE 0.88 0.87  CALCIUM 8.4* 8.4*    PT/INR: No results for input(s): LABPROT, INR in the last 72 hours. ABG    Component Value Date/Time   PHART 7.433 06/12/2016 1103   HCO3 25.2 (H) 06/12/2016 1103   TCO2 21.9 06/12/2016 1103   ACIDBASEDEF 0.8 06/11/2016 1730   O2SAT 95.5 06/12/2016 1103   CBG (last 3)   Recent Labs  06/16/16 1725 06/16/16 2127 06/17/16 0717  GLUCAP 205* 175* 168*    Assessment/Plan: S/P Procedure(s) (LRB): Left Heart Cath and Coronary Angiography (N/A) Treat L chest wall - axilla MRSA infection as is being done CABG later   LOS: 7 days    Tharon Aquas Trigt III 06/17/2016

## 2016-06-17 NOTE — Progress Notes (Signed)
ANTICOAGULATION CONSULT NOTE - f/u Consult  Pharmacy Consult for Heparin Indication: chest pain/ACS  Patient Measurements: Height: 5\' 6"  (167.6 cm) Weight: 158 lb 15.2 oz (72.1 kg) IBW/kg (Calculated) : 63.8 Heparin Dosing Weight: 73 kg  Vital Signs: Temp: 98.2 F (36.8 C) (08/30 0700) Temp Source: Oral (08/30 0700) BP: 98/86 (08/30 0700) Pulse Rate: 80 (08/30 0700)  Labs:  Recent Labs  06/15/16 0420 06/15/16 0745 06/16/16 0404 06/17/16 0206  HGB 13.7  --  13.5 13.4  HCT 41.2  --  40.8 41.3  PLT 158  --  177 176  HEPARINUNFRC 0.43  --  0.70 0.38  CREATININE  --  0.95 0.88 0.87    Estimated Creatinine Clearance: 86.6 mL/min (by C-G formula based on SCr of 0.87 mg/dL).  Assessment: 62 yoM on heparin for possible NSTEMI with HL at goal. Patient taken to cath 8/29 and found to have extensive severe multivessel disease - now awaiting surgical consult for possible CABG. Heparin to be resumed 8 hours post sheath removal on 8/23 at 2100. CBC stable. No issues with line or bleeding reported per RN.  Goal of Therapy:  Heparin level 0.3-0.7 units/ml Monitor platelets by anticoagulation protocol: Yes   Plan: Continue heparin at 1600 uts/hr  Daily HL, CBC  Melburn Popper, PharmD Clinical Pharmacy Resident Pager: [phone] 06/17/16 8:48 AM

## 2016-06-17 NOTE — Progress Notes (Signed)
Benefits check: Apixaban  S/W [place] @ [facility] # [phone]   1. APIXABAN 5 MG BID ( 30 )  NOT ON FORMULARY   2. ELIQUIS 5 MG BID  AND 2.5 MG BID  ( 30 )  COVER- YES  CO-PAY- $ 48.15  MAIL-ORDER FOR 90 DAY SUPPLY $ 125.00  PRIOR APPROVAL - NO  [facility]   Whitman Hero RN,BSN,CM [phone]

## 2016-06-18 ENCOUNTER — Inpatient Hospital Stay (HOSPITAL_COMMUNITY): Payer: BLUE CROSS/BLUE SHIELD

## 2016-06-18 ENCOUNTER — Encounter (HOSPITAL_COMMUNITY): Payer: Managed Care, Other (non HMO)

## 2016-06-18 DIAGNOSIS — Z01810 Encounter for preprocedural cardiovascular examination: Secondary | ICD-10-CM

## 2016-06-18 LAB — GLUCOSE, CAPILLARY
GLUCOSE-CAPILLARY: 217 mg/dL — AB (ref 65–99)
GLUCOSE-CAPILLARY: 233 mg/dL — AB (ref 65–99)
GLUCOSE-CAPILLARY: 284 mg/dL — AB (ref 65–99)
Glucose-Capillary: 210 mg/dL — ABNORMAL HIGH (ref 65–99)

## 2016-06-18 LAB — BASIC METABOLIC PANEL
Anion gap: 8 (ref 5–15)
BUN: 14 mg/dL (ref 6–20)
CHLORIDE: 104 mmol/L (ref 101–111)
CO2: 27 mmol/L (ref 22–32)
Calcium: 8.6 mg/dL — ABNORMAL LOW (ref 8.9–10.3)
Creatinine, Ser: 1.04 mg/dL (ref 0.61–1.24)
GFR calc Af Amer: >60 mL/min (ref >60)
GFR calc non Af Amer: >60 mL/min (ref >60)
Glucose, Bld: 220 mg/dL — ABNORMAL HIGH (ref 65–99)
POTASSIUM: 4.8 mmol/L (ref 3.5–5.1)
SODIUM: 139 mmol/L (ref 135–145)

## 2016-06-18 LAB — CBC
HEMATOCRIT: 39.4 % (ref 39.0–52.0)
HEMOGLOBIN: 12.7 g/dL — AB (ref 13.0–17.0)
MCH: 28 pg (ref 26.0–34.0)
MCHC: 32.2 g/dL (ref 30.0–36.0)
MCV: 86.8 fL (ref 78.0–100.0)
Platelets: 180 10*3/uL (ref 150–400)
RBC: 4.54 MIL/uL (ref 4.22–5.81)
RDW: 13.1 % (ref 11.5–15.5)
WBC: 14.2 10*3/uL — ABNORMAL HIGH (ref 4.0–10.5)

## 2016-06-18 LAB — MAGNESIUM: Magnesium: 1.8 mg/dL (ref 1.7–2.4)

## 2016-06-18 LAB — PREALBUMIN: Prealbumin: 10.4 mg/dL — ABNORMAL LOW (ref 18–38)

## 2016-06-18 MED ORDER — FUROSEMIDE 40 MG PO TABS
40.0000 mg | ORAL_TABLET | Freq: Two times a day (BID) | ORAL | Status: DC
  Administered 2016-06-18 – 2016-06-19 (×2): 40 mg via ORAL
  Filled 2016-06-18 (×2): qty 1

## 2016-06-18 MED ORDER — FUROSEMIDE 10 MG/ML IJ SOLN
40.0000 mg | Freq: Once | INTRAMUSCULAR | Status: AC
  Administered 2016-06-18: 40 mg via INTRAVENOUS
  Filled 2016-06-18: qty 4

## 2016-06-18 NOTE — Progress Notes (Signed)
2 Days Post-Op  Subjective: Pt with NAE  Objective: Vital signs in last 24 hours: Temp:  [98 F (36.7 C)-99.2 F (37.3 C)] 98.8 F (37.1 C) (08/31 0412) Pulse Rate:  [75-89] 78 (08/31 0600) Resp:  [17-25] 25 (08/31 0600) BP: (93-120)/(61-81) 101/71 (08/31 0600) SpO2:  [90 %-97 %] 93 % (08/31 0600) Weight:  [74.3 kg (163 lb 14.4 oz)] 74.3 kg (163 lb 14.4 oz) (08/31 0412) Last BM Date: 06/17/16  Intake/Output from previous day: 08/30 0701 - 08/31 0700 In: 250 [IV Piggyback:250] Out: 800 [Urine:800] Intake/Output this shift: No intake/output data recorded.  General appearance: alert and cooperative Incision/Wound: c/d/i, min erythema on edge of wound, ss drainage  Lab Results:   Recent Labs  06/17/16 0206 06/18/16 0348  WBC 13.9* 14.2*  HGB 13.4 12.7*  HCT 41.3 39.4  PLT 176 180   BMET  Recent Labs  06/17/16 0206 06/18/16 0348  NA 139 139  K 4.1 4.8  CL 100* 104  CO2 29 27  GLUCOSE 174* 220*  BUN 14 14  CREATININE 0.87 1.04  CALCIUM 8.4* 8.6*   Studies/Results: Dg Chest Port 1 View  Result Date: 06/18/2016 CLINICAL DATA:  Wound infection.  Pneumonia.  History of CHF. EXAM: PORTABLE CHEST 1 VIEW COMPARISON:  06/15/2016 . FINDINGS: Mediastinum hilar structures are unremarkable. Cardiomegaly. Slight worsening of bilateral pulmonary infiltrates consistent with pulmonary edema and/or pneumonia. Small bilateral pleural effusions noted on today's exam . No pneumothorax. IMPRESSION: Slight worsening of bilateral bilateral pulmonary edema. Small bilateral pleural effusions. Electronically Signed   By: Marcello Moores  Register   On: 06/18/2016 07:13    Anti-infectives: Anti-infectives    Start     Dose/Rate Route Frequency Ordered Stop   06/17/16 1000  levofloxacin (LEVAQUIN) tablet 500 mg     500 mg Oral Daily 06/17/16 0856 06/17/16 0906   06/15/16 1200  levofloxacin (LEVAQUIN) tablet 500 mg  Status:  Discontinued     500 mg Oral Daily 06/15/16 1141 06/17/16 0856   06/15/16 0600  vancomycin (VANCOCIN) 1,250 mg in sodium chloride 0.9 % 250 mL IVPB     1,250 mg 166.7 mL/hr over 90 Minutes Intravenous Every 12 hours 06/15/16 0534     06/12/16 1800  vancomycin (VANCOCIN) IVPB 1000 mg/200 mL premix  Status:  Discontinued     1,000 mg 200 mL/hr over 60 Minutes Intravenous Every 12 hours 06/12/16 1241 06/15/16 0534   06/11/16 1400  piperacillin-tazobactam (ZOSYN) IVPB 3.375 g  Status:  Discontinued     3.375 g 100 mL/hr over 30 Minutes Intravenous Every 8 hours 06/11/16 1246 06/11/16 1247   06/11/16 1400  piperacillin-tazobactam (ZOSYN) IVPB 3.375 g  Status:  Discontinued     3.375 g 12.5 mL/hr over 240 Minutes Intravenous Every 8 hours 06/11/16 1249 06/15/16 1141   06/11/16 1330  vancomycin (VANCOCIN) IVPB 750 mg/150 ml premix  Status:  Discontinued     750 mg 150 mL/hr over 60 Minutes Intravenous Every 8 hours 06/11/16 1305 06/12/16 1241   06/10/16 1900  piperacillin-tazobactam (ZOSYN) IVPB 3.375 g  Status:  Discontinued     3.375 g 12.5 mL/hr over 240 Minutes Intravenous Every 8 hours 06/10/16 1805 06/10/16 2207      Assessment/Plan: L axillary abscess, s/p I&D -con't wound care TID -abx for ax abscess can be stopped  LOS: 8 days    Rosario Jacks., Anne Hahn 06/18/2016

## 2016-06-18 NOTE — Progress Notes (Signed)
Pre-op Cardiac Surgery  Carotid Findings:  No significant stenosis in bilateral carotid arteries. Vertebral arteries demonstrated antegrade flow.   Upper Extremity Right Left  Brachial Pressures 121 120  Radial Waveforms Triphasic triphasic  Ulnar Waveforms Triphasic triphasic  Palmar Arch (Allen's Test) WNL WNL   Findings:  Bilateral Doppler waveforms remain within normal limits with compression.    Lower  Extremity Right Left  Dorsalis Pedis 147 130  Posterior Tibial 139 135  Ankle/Brachial Indices 1.2 1.1    Findings:  ABI normal at rest.

## 2016-06-18 NOTE — Progress Notes (Signed)
Pt had a small nose bleed at this time from R nare. O2 already humidified.  Family member noted the blood before pt was aware of it. Pt denies picking at nose. Bleeding had stopped when RN was informed. Instructed pt not to blow his nose hard if he had to blow it, (he blew it some after he was aware of his nose bleeding) and instructed to call staff if any more bleeding occurred. Pt educated on eliquis this am, discussing bruising easily and bleeding risk, and taking longer to clot if gets a cut.  Pt verbalized understanding, teach back done.

## 2016-06-18 NOTE — Progress Notes (Signed)
TELEMETRY: Reviewed telemetry pt in NSR Vitals:   06/18/16 0400 06/18/16 0412 06/18/16 0600 06/18/16 0815  BP: 112/75  101/71 110/73  Pulse: 78  78 78  Resp: (!) 24  (!) 25 (!) 21  Temp:  98.8 F (37.1 C)  97.7 F (36.5 C)  TempSrc:  Oral  Oral  SpO2: 93%  93% 92%  Weight:  163 lb 14.4 oz (74.3 kg)    Height:        Intake/Output Summary (Last 24 hours) at 06/18/16 0838 Last data filed at 06/17/16 2256  Gross per 24 hour  Intake              250 ml  Output                0 ml  Net              250 ml   Filed Weights   06/12/16 1330 06/16/16 0400 06/18/16 0412  Weight: 161 lb 6 oz (73.2 kg) 158 lb 15.2 oz (72.1 kg) 163 lb 14.4 oz (74.3 kg)    Subjective  No chest pain. Notes increased dyspnea and cough today.  Marland Kitchen apixaban  5 mg Oral BID  . aspirin  81 mg Oral Daily  . atorvastatin  80 mg Oral q1800  . buPROPion  150 mg Oral Daily  . furosemide  40 mg Intravenous Once  . furosemide  40 mg Oral BID  . insulin aspart  0-9 Units Subcutaneous TID WC  . insulin glargine  10 Units Subcutaneous Daily  . lisinopril  5 mg Oral Daily  . mouth rinse  15 mL Mouth Rinse BID  . metoprolol tartrate  50 mg Oral BID  . nicotine  7 mg Transdermal Daily  . potassium chloride  40 mEq Oral BID  . sodium chloride flush  3 mL Intravenous Q12H  . vancomycin  1,250 mg Intravenous Q12H   . sodium chloride Stopped (06/17/16 0605)    LABS: Basic Metabolic Panel:  Recent Labs  06/16/16 0404 06/17/16 0206 06/18/16 0348  NA 139 139 139  K 3.8 4.1 4.8  CL 96* 100* 104  CO2 33* 29 27  GLUCOSE 134* 174* 220*  BUN 16 14 14   CREATININE 0.88 0.87 1.04  CALCIUM 8.4* 8.4* 8.6*  MG 1.6*  --  1.8   Liver Function Tests: No results for input(s): AST, ALT, ALKPHOS, BILITOT, PROT, ALBUMIN in the last 72 hours. No results for input(s): LIPASE, AMYLASE in the last 72 hours. CBC:  Recent Labs  06/17/16 0206 06/18/16 0348  WBC 13.9* 14.2*  HGB 13.4 12.7*  HCT 41.3 39.4  MCV 86.6 86.8    PLT 176 180   Cardiac Enzymes: No results for input(s): CKTOTAL, CKMB, CKMBINDEX, TROPONINI in the last 72 hours. BNP: No results for input(s): PROBNP in the last 72 hours. D-Dimer: No results for input(s): DDIMER in the last 72 hours. Hemoglobin A1C: No results for input(s): HGBA1C in the last 72 hours. Fasting Lipid Panel: No results for input(s): CHOL, HDL, LDLCALC, TRIG, CHOLHDL, LDLDIRECT in the last 72 hours. Thyroid Function Tests: No results for input(s): TSH, T4TOTAL, T3FREE, THYROIDAB in the last 72 hours.  Invalid input(s): FREET3   Radiology/Studies:  Dg Chest Port 1 View  Result Date: 06/18/2016 CLINICAL DATA:  Wound infection.  Pneumonia.  History of CHF. EXAM: PORTABLE CHEST 1 VIEW COMPARISON:  06/15/2016 . FINDINGS: Mediastinum hilar structures are unremarkable. Cardiomegaly. Slight worsening of bilateral pulmonary infiltrates consistent  with pulmonary edema and/or pneumonia. Small bilateral pleural effusions noted on today's exam . No pneumothorax. IMPRESSION: Slight worsening of bilateral bilateral pulmonary edema. Small bilateral pleural effusions. Electronically Signed   By: Marcello Moores  Register   On: 06/18/2016 07:13   Echo: 06/12/16:Study Conclusions  - Left ventricle: The cavity size was normal. There was mild   concentric hypertrophy. Systolic function was severely reduced.   The estimated ejection fraction was in the range of 25% to 30%.   Akinesis of the basal-midinferolateral and inferior myocardium.   Severe hypokinesis of the lateral myocardium. ; consistent with   infarction in the distribution of the right coronary and left   circumflex coronary artery. Features are consistent with a   pseudonormal left ventricular filling pattern, with concomitant   abnormal relaxation and increased filling pressure (grade 2   diastolic dysfunction). - Mitral valve: There was mild regurgitation. - Pericardium, extracardiac: A trivial pericardial effusion was    identified.  PHYSICAL EXAM General: Well developed, well nourished, in no acute distress. Head: Normocephalic, atraumatic, sclera non-icteric, oropharynx is clear with poor dentition Neck: Negative for carotid bruits. JVD not elevated. No adenopathy Lungs: Bilateral coarse BS and crackles.  Breathing is unlabored. Heart: RRR S1 S2 without murmurs, rubs, or gallops.  Abdomen: Soft, non-tender, non-distended with normoactive bowel sounds. No hepatomegaly. No rebound/guarding. No obvious abdominal masses. Left axilla with surgical dressing. Extremities: No clubbing, cyanosis or edema.  Distal pedal pulses are 2+ and equal bilaterally. Radial cath site without hematoma. Neuro: Alert and oriented X 3. Moves all extremities spontaneously. Psych:  Responds to questions appropriately with a normal affect.  ASSESSMENT AND PLAN: 1. Paroxysmal AFib with RVR. Previously had 6.5 second post termination pause. Digoxin stopped and metoprolol increased. No Afib in over 72 hours.  If he were to have recurrent  afib may need to consider amiodarone. On Eliquis for anticoagulation.  2. NSTEMI. Peak troponin 16.06. Ecg with minimal ST depression laterally. EF is markedly reduced. Cardiac cath showed severe 3 vessel CAD. Not a candidate for PCI. Ultimately CABG would be his best option but he is not a candidate at this time due to active infections. Plan CABG at later date- post hospitalization once clinical status improved.  3. Acute systolic CHF. Ischemic cardiomyopathy. EF 25-30% by Echo with regional wall motion abnormality. On metoprolol and ACEi. Weight up today and CXR shows increased pulmonary edema with effusions.  Will given IV lasix this am then start on oral regimen of 40 mg bid. Monitor I/O and weight. Sodium restriction.  4. Acute hypoxic respiratory failure secondary to PNA (by CT)/aspiration and/or CHF. Clinically improved but still requiring oxygen. Diuresis.  5. Left axillary abscess. S/p surgical  drainage. 6. DM on insulin 7. HTN 8. Tobacco abuse. Counseled on cessation. 9. Hyperlipidemia on high dose statin. 10. Hypomagnesemia. Repleating.   Present on Admission: . Abscess of axilla, left . Pure hypercholesterolemia . Type 2 diabetes mellitus, uncontrolled ([place]) . Essential hypertension . Acute respiratory failure with hypoxia ([place]) . NSTEMI (non-ST elevated myocardial infarction) ([place]) . Smoking . Acute on chronic systolic CHF (congestive heart failure) ([place]) . Atrial fibrillation ([place])   Signed, Treveon Bourcier [place], [place] 06/18/2016 8:38 AM

## 2016-06-18 NOTE — Progress Notes (Signed)
Pt transferring to telemetry unit, room 2W05. Report completed at this time to RN Lexi on unit [address]. Vascular in with pt doing pre-CABG dopplers. Will transfer pt as soon as possible. Pt made aware of new room number.

## 2016-06-18 NOTE — Progress Notes (Signed)
Pt transferred via wheelchair with belongings, on O2, with unit RN. Pt stated he would call his family with new room number when he gets to his new room.

## 2016-06-18 NOTE — Progress Notes (Signed)
L axilla dsg changed per order. Daughter present at time and she observed dsg changed, and was given directions as to how the dsg was been done. Family may be changing dressing some at home. Pt has orders for a [facility] for wound care. (pt orders are for changing dsg every 8 hours).  Open wounds x2 must be packed with NS gauze, explained this to pt and daughter and the importance of packing them for proper healing.

## 2016-06-18 NOTE — Care Management Note (Addendum)
Case Management Note  Patient Details  Name: [name] MRN: [id number] Date of Birth: [birth year]  Subjective/Objective:    Adm w axilla abscess, nstemi                Action/Plan: hhc arranged w [facility] and hhpt.    Expected Discharge Date:                  Expected Discharge Plan:  [place]  In-House Referral:     Discharge planning Services  CM Consult, Follow-up appt scheduled, Medication Assistance  Post Acute Care Choice:  Home Health Choice offered to:  Spouse  DME Arranged:    DME Agency:     HH Arranged:  RN, PT [place] Agency:  [facility]  Status of Service:  Completed, signed off  If discussed at [place] of Stay Meetings, dates discussed:    Additional Comments: prev case mng worked on [employer]. appt w [facility] at [facility] on 05-26-17 at 11: 72 am. Lacretia Leigh, RN 06/18/2016, 10:19 AM

## 2016-06-18 NOTE — Progress Notes (Signed)
[facility]                                                                              Patient Demographics  [name], is a 55 y.o. male, DOB - 12/10/1960, [id number]  Admit date - 06/10/2016   Admitting Physician Coralie Keens, MD  Outpatient Primary MD for the patient is Kennyth Arnold, FNP  Outpatient specialists:   LOS - 8  days    No chief complaint on file.      Brief summary   [name] is a 55 year old male with history of diabetes mellitus, type II, hypertension, hyperlipidemia who was admitted for incision and drainage of left axilla abscess. Patient developed acute hypoxic respiratory failure on 8/24 and was found to have an NSTEMI, elevation in troponin with associated new Q waves in inferior leads initially without chest pain. Patient was started on heparin and cardiology consulted for management, started on BIPAP and transferred to [facility] from [facility] for cardiac cath Since transfer, had AFib RVR, requiring cardizem drip off and on, limited by pauses and RVR. Underwent cardiac cath on 8/29, recommended CABG  Assessment & Plan    Acute Hypoxic resp failure -due to MI/CHF and aspiration pneumonia and suspected underlying COPD(>50ppd smoker), Acute systolic CHF -improving, weaned off BiPAP -On vancomycin and levofloxacin (completed) -Chest x-ray showed worsening pulmonary edema, given 40 mg IV Lasix 1 and restarted on Lasix overall 40MG  twice a day -suspect he will need Home O2 due to undiagnosed COPD, home O2 evaluation prior to dc  NSTEMI - ? Post op silent MI -troponin trending down from peak of16 -ECHO with EF of 25% and significant WMA -Akinesis of the basal-midinferolateral and inferior myocardium - continue ASA/Metoprolol - Patient underwent cardiac cath on 8/29 which showed severe multivessel CAD with diffuse narrowing of LAD, 60-70% proximal stenosis, 50% mid and distal diffuse stenosis, total occlusion off high  marginal, ramus intermediate vessel, left circumflex 85% stenosis, total occlusion of marginal branches and distal AV groove circumflex, mid 50% stenosis of small nondominant RCA. Recommended CABG -Cardiothoracic surgery consulted on the patient, seen by Dr Nils Pyle, recommended CABG at a later date, delayed due to active infections  Acute systolic CHF - EDP normal by cath, restarted diuretics - Strict I's and O's and daily weights, positive balance of 3.2 L today, weight up from 158-> 163 today  MRSA L axillar abscess -s/p I&D 8/23 by Dr.Toth -Cx with MRSA, on IV Vanc D8 -No further antibiotics needed at the time of discharge per surgery. I had discussed with ID, Dr. Megan Salon earlier this morning, had recommended okay to give 5-7 days of oral doxycycline if patient still has any fevers or leukocytosis.   Afib with RVR - Currently stable. Previously had 6.5 second posttraumatic pauses, digoxin was stopped and metoprolol was increased, per cardiology if recurrent A. fib, may need to consider amiodarone.  - Patient started on eliquis today  Essential Hypertension Controlled -Lisinopril held on admission, resumed by Cardiology  Diabetes mellitus, type 2 Uncontrolled, was not taking any meds >1year, HbA1c is 12.2 -Sliding-scale insulin, Continue Lantus 10 units subcutaneous  Hypercholesterolemia -  Simvastatin changed to atorvastatin.  Tobacco abuse -counseled   Code Status: full  DVT Prophylaxis: eliquis Family Communication: Discussed in detail with the patient, all imaging results, lab results explained to the patient  Disposition Plan:  Transfer to telemetry  Time Spent in minutes: 64mins   Procedures:   Consultants:   Cardiothoracic surgery Cardiology Surgery  Antimicrobials:   IV vancomycin  Levaquin complete   Medications  Scheduled Meds: . apixaban  5 mg Oral BID  . aspirin  81 mg Oral Daily  . atorvastatin  80 mg Oral q1800  . buPROPion  150 mg  Oral Daily  . furosemide  40 mg Oral BID  . insulin aspart  0-9 Units Subcutaneous TID WC  . insulin glargine  10 Units Subcutaneous Daily  . lisinopril  5 mg Oral Daily  . mouth rinse  15 mL Mouth Rinse BID  . metoprolol tartrate  50 mg Oral BID  . nicotine  7 mg Transdermal Daily  . potassium chloride  40 mEq Oral BID  . sodium chloride flush  3 mL Intravenous Q12H  . vancomycin  1,250 mg Intravenous Q12H   Continuous Infusions: . sodium chloride Stopped (06/17/16 0605)   PRN Meds:.sodium chloride, acetaminophen, albuterol, ALPRAZolam, guaiFENesin-dextromethorphan, morphine injection, nitroGLYCERIN, ondansetron (ZOFRAN) IV, ondansetron (ZOFRAN) IV, oxyCODONE-acetaminophen, promethazine, sodium chloride flush, zolpidem   Antibiotics   Anti-infectives    Start     Dose/Rate Route Frequency Ordered Stop   06/17/16 1000  levofloxacin (LEVAQUIN) tablet 500 mg     500 mg Oral Daily 06/17/16 0856 06/17/16 0906   06/15/16 1200  levofloxacin (LEVAQUIN) tablet 500 mg  Status:  Discontinued     500 mg Oral Daily 06/15/16 1141 06/17/16 0856   06/15/16 0600  vancomycin (VANCOCIN) 1,250 mg in sodium chloride 0.9 % 250 mL IVPB     1,250 mg 166.7 mL/hr over 90 Minutes Intravenous Every 12 hours 06/15/16 0534     06/12/16 1800  vancomycin (VANCOCIN) IVPB 1000 mg/200 mL premix  Status:  Discontinued     1,000 mg 200 mL/hr over 60 Minutes Intravenous Every 12 hours 06/12/16 1241 06/15/16 0534   06/11/16 1400  piperacillin-tazobactam (ZOSYN) IVPB 3.375 g  Status:  Discontinued     3.375 g 100 mL/hr over 30 Minutes Intravenous Every 8 hours 06/11/16 1246 06/11/16 1247   06/11/16 1400  piperacillin-tazobactam (ZOSYN) IVPB 3.375 g  Status:  Discontinued     3.375 g 12.5 mL/hr over 240 Minutes Intravenous Every 8 hours 06/11/16 1249 06/15/16 1141   06/11/16 1330  vancomycin (VANCOCIN) IVPB 750 mg/150 ml premix  Status:  Discontinued     750 mg 150 mL/hr over 60 Minutes Intravenous Every 8 hours  06/11/16 1305 06/12/16 1241   06/10/16 1900  piperacillin-tazobactam (ZOSYN) IVPB 3.375 g  Status:  Discontinued     3.375 g 12.5 mL/hr over 240 Minutes Intravenous Every 8 hours 06/10/16 1805 06/10/16 2207        Subjective:   [name] was seen and examined today.  Overnight was somewhat short of breath, coughing, O2 sats 92% on 4 L. Patient denies dizziness, chest pain, abdominal pain, N/V/D/C, new weakness, numbess, tingling. No acute events overnight.    Objective:   Vitals:   06/18/16 0400 06/18/16 0412 06/18/16 0600 06/18/16 0815  BP: 112/75  101/71 110/73  Pulse: 78  78 78  Resp: (!) 24  (!) 25 (!) 21  Temp:  98.8 F (37.1 C)  97.7 F (36.5 C)  TempSrc:  Oral  Oral  SpO2: 93%  93% 92%  Weight:  74.3 kg (163 lb 14.4 oz)    Height:        Intake/Output Summary (Last 24 hours) at 06/18/16 1029 Last data filed at 06/17/16 2256  Gross per 24 hour  Intake              250 ml  Output                0 ml  Net              250 ml     Wt Readings from Last 3 Encounters:  06/18/16 74.3 kg (163 lb 14.4 oz)  05/23/13 80.3 kg (177 lb)  09/02/12 75.3 kg (166 lb)     Exam  General: Alert and oriented x 3, NAD  HEENT:    Neck: Supple, no JVD,  Cardiovascular: S1 S2 auscultated, no rubs, murmurs or gallops. Regular rate and rhythm.  Respiratory:Bibasilar crackles   Gastrointestinal: Soft, nontender, nondistended, + bowel sounds  Ext: no cyanosis clubbing or edema, Dressing intact on the left axilla with packing  Neuro:no new deficits   Skin: No rashes  Psych: Normal affect and demeanor, alert and oriented x3    Data Reviewed:  I have personally reviewed following labs and imaging studies  Micro Results Recent Results (from the past 240 hour(s))  Surgical pcr screen     Status: None   Collection Time: 06/10/16  6:39 PM  Result Value Ref Range Status   MRSA, PCR NEGATIVE NEGATIVE Final   Staphylococcus aureus NEGATIVE NEGATIVE Final    Comment:         The Xpert SA Assay (FDA approved for NASAL specimens in patients over 81 years of age), is one component of a comprehensive surveillance program.  Test performance has been validated by [facility] for patients greater than or equal to 41 year old. It is not intended to diagnose infection nor to guide or monitor treatment.   Aerobic/Anaerobic Culture (surgical/deep wound)     Status: None   Collection Time: 06/10/16  8:50 PM  Result Value Ref Range Status   Specimen Description ABSCESS LEFT AXILLA  Final   Special Requests PATIENT ON FOLLOWING ZOSYN  Final   Gram Stain   Final    ABUNDANT WBC PRESENT,BOTH PMN AND MONONUCLEAR ABUNDANT GRAM POSITIVE COCCI IN CLUSTERS IN PAIRS    Culture   Final    ABUNDANT STAPHYLOCOCCUS AUREUS NO ANAEROBES ISOLATED SUSCEPTIBILITIES PERFORMED ON PREVIOUS CULTURE WITHIN THE LAST 5 DAYS. Performed at [facility]    Report Status 06/17/2016 FINAL  Final  Aerobic Culture (superficial specimen)     Status: None   Collection Time: 06/11/16 12:31 PM  Result Value Ref Range Status   Specimen Description AXILLA LEFT  Final   Special Requests NONE  Final   Gram Stain   Final    NO WBC SEEN ABUNDANT GRAM POSITIVE COCCI IN PAIRS IN CLUSTERS Performed at [facility]    Culture   Final    ABUNDANT METHICILLIN RESISTANT STAPHYLOCOCCUS AUREUS   Report Status 06/14/2016 FINAL  Final   Organism ID, Bacteria METHICILLIN RESISTANT STAPHYLOCOCCUS AUREUS  Final      Susceptibility   Methicillin resistant staphylococcus aureus - MIC*    CIPROFLOXACIN >=8 RESISTANT Resistant     ERYTHROMYCIN >=8 RESISTANT Resistant     GENTAMICIN <=0.5 SENSITIVE Sensitive     OXACILLIN >=4 RESISTANT Resistant  TETRACYCLINE <=1 SENSITIVE Sensitive     VANCOMYCIN <=0.5 SENSITIVE Sensitive     TRIMETH/SULFA <=10 SENSITIVE Sensitive     CLINDAMYCIN <=0.25 SENSITIVE Sensitive     RIFAMPIN <=0.5 SENSITIVE Sensitive     Inducible Clindamycin NEGATIVE  Sensitive     * ABUNDANT METHICILLIN RESISTANT STAPHYLOCOCCUS AUREUS    Radiology Reports Ct Angio Chest Pe W Or Wo Contrast  Result Date: 06/11/2016 CLINICAL DATA:  Lethargy, hypoxia ; status post LEFT axillary abscess incision and drainage. History hypertension, hyperlipidemia, uncontrolled diabetes. EXAM: CT ANGIOGRAPHY CHEST WITH CONTRAST TECHNIQUE: Multidetector CT imaging of the chest was performed using the standard protocol during bolus administration of intravenous contrast. Multiplanar CT image reconstructions and MIPs were obtained to evaluate the vascular anatomy. CONTRAST:  100 cc Isovue 370 COMPARISON:  Chest radiographs August 24th 1719 hours FINDINGS: PULMONARY ARTERY: Adequate contrast opacification of the pulmonary artery's. Main pulmonary artery is not enlarged. No pulmonary arterial filling defects to the level of the subsegmental branches. MEDIASTINUM: Heart size is mildly enlarged. No RIGHT heart strain. Scattered coronary artery calcifications. No pericardial effusion. Thoracic aorta is normal course and caliber, trace calcific atherosclerosis at the aortic arch. LUNGS: Tracheobronchial tree is patent, no pneumothorax. Dense hypo enhancing consolidation in the lower lobes bilaterally air bronchograms. SOFT TISSUES AND OSSEOUS STRUCTURES: Included view of the abdomen is unremarkable. LEFT axilla skin defect with small surrounding lymph nodes compatible with history of abscess drainage, no focal fluid collection. IMPRESSION: No acute pulmonary embolism. Bilateral lower lobe pneumonia. Status post LEFT axillary incision and drainage, no focal fluid collection. Electronically Signed   By: Elon Alas M.D.   On: 06/11/2016 18:48   Dg Chest Port 1 View  Result Date: 06/18/2016 CLINICAL DATA:  Wound infection.  Pneumonia.  History of CHF. EXAM: PORTABLE CHEST 1 VIEW COMPARISON:  06/15/2016 . FINDINGS: Mediastinum hilar structures are unremarkable. Cardiomegaly. Slight worsening of  bilateral pulmonary infiltrates consistent with pulmonary edema and/or pneumonia. Small bilateral pleural effusions noted on today's exam . No pneumothorax. IMPRESSION: Slight worsening of bilateral bilateral pulmonary edema. Small bilateral pleural effusions. Electronically Signed   By: Marcello Moores  Register   On: 06/18/2016 07:13   Dg Chest Port 1 View  Result Date: 06/15/2016 CLINICAL DATA:  Shortness of breath. Cough. Hypoxia. Acute myocardial infarction. EXAM: PORTABLE CHEST 1 VIEW COMPARISON:  Chest x-ray dated 06/13/2016 FINDINGS: There is persistent bilateral pulmonary edema with Kerley B-lines at the bases. Small left pleural effusion. Heart size is normal. Slight pulmonary vascular congestion. No acute bone abnormality. Arthritic changes of the right shoulder with evidence consistent with chronic rotator cuff disease. IMPRESSION: Persistent bilateral pulmonary edema with increased small left effusion. Electronically Signed   By: Lorriane Shire M.D.   On: 06/15/2016 13:31   Dg Chest Port 1 View  Result Date: 06/13/2016 CLINICAL DATA:  Shortness of breath. EXAM: PORTABLE CHEST 1 VIEW COMPARISON:  06/11/2016 FINDINGS: Mild cardiac enlargement. Bilateral pleural effusions and moderate interstitial edema is identified compatible with CHF. IMPRESSION: Moderate CHF. Electronically Signed   By: Kerby Moors M.D.   On: 06/13/2016 12:37   Dg Chest Port 1 View  Result Date: 06/11/2016 CLINICAL DATA:  Hypoxia, weakness today. History of hypertension, diabetes. EXAM: PORTABLE CHEST 1 VIEW COMPARISON:  Chest radiograph December 07, 2011 FINDINGS: The cardiac silhouette is mildly enlarged. Pulmonary vascular congestion and mild interstitial prominence without pleural effusion or focal consolidation. No pneumothorax tiny scattered granulomata. Soft tissue planes included osseous structures are nonsuspicious, high-riding RIGHT humeral head  suggests old rotator cuff injury. IMPRESSION: Mild cardiomegaly. Pulmonary  vascular congestion. Interstitial prominence favoring pulmonary edema, less likely atypical pneumonia. Electronically Signed   By: Elon Alas M.D.   On: 06/11/2016 17:47    Lab Data:  CBC:  Recent Labs Lab 06/14/16 0325 06/15/16 0420 06/16/16 0404 06/17/16 0206 06/18/16 0348  WBC 9.7 11.5* 13.2* 13.9* 14.2*  HGB 12.7* 13.7 13.5 13.4 12.7*  HCT 38.7* 41.2 40.8 41.3 39.4  MCV 85.8 84.6 85.5 86.6 86.8  PLT 140* 158 177 176 [id number]   Basic Metabolic Panel:  Recent Labs Lab 06/12/16 0152 06/15/16 0745 06/16/16 0404 06/17/16 0206 06/18/16 0348  NA 134* 137 139 139 139  K 3.8 2.9* 3.8 4.1 4.8  CL 96* 97* 96* 100* 104  CO2 27 34* 33* 29 27  GLUCOSE 186* 155* 134* 174* 220*  BUN 30* 16 16 14 14   CREATININE 1.19 0.95 0.88 0.87 1.04  CALCIUM 8.4* 8.0* 8.4* 8.4* 8.6*  MG  --   --  1.6*  --  1.8   GFR: Estimated Creatinine Clearance: 72.4 mL/min (by C-G formula based on SCr of 1.04 mg/dL). Liver Function Tests:  Recent Labs Lab 06/11/16 1723  AST 62*  ALT 31  ALKPHOS 84  BILITOT 1.1  PROT 7.2  ALBUMIN 3.5   No results for input(s): LIPASE, AMYLASE in the last 168 hours. No results for input(s): AMMONIA in the last 168 hours. Coagulation Profile: No results for input(s): INR, PROTIME in the last 168 hours. Cardiac Enzymes:  Recent Labs Lab 06/11/16 1723 06/11/16 2320 06/12/16 0721 06/13/16 1610 06/14/16 0325  TROPONINI 5.90* 16.06* 10.36* 3.63* 3.20*   BNP (last 3 results) No results for input(s): PROBNP in the last 8760 hours. HbA1C: No results for input(s): HGBA1C in the last 72 hours. CBG:  Recent Labs Lab 06/17/16 0717 06/17/16 1137 06/17/16 1602 06/17/16 2123 06/18/16 0812  GLUCAP 168* 191* 174* 175* 217*   Lipid Profile: No results for input(s): CHOL, HDL, LDLCALC, TRIG, CHOLHDL, LDLDIRECT in the last 72 hours. Thyroid Function Tests: No results for input(s): TSH, T4TOTAL, FREET4, T3FREE, THYROIDAB in the last 72 hours. Anemia  Panel: No results for input(s): VITAMINB12, FOLATE, FERRITIN, TIBC, IRON, RETICCTPCT in the last 72 hours. Urine analysis:    Component Value Date/Time   COLORURINE YELLOW 12/02/2011 [place] 12/02/2011 1456   LABSPEC 1.034 (H) 12/02/2011 1456   PHURINE 5.5 12/02/2011 1456   GLUCOSEU >1000 (A) 12/02/2011 1456   HGBUR NEGATIVE 12/02/2011 1456   BILIRUBINUR NEGATIVE 12/02/2011 1456   KETONESUR NEGATIVE 12/02/2011 1456   PROTEINUR NEGATIVE 12/02/2011 1456   UROBILINOGEN 1.0 12/02/2011 1456   NITRITE NEGATIVE 12/02/2011 1456   LEUKOCYTESUR NEGATIVE 12/02/2011 1456     RAI,RIPUDEEP M.D. [facility] 06/18/2016, 10:29 AM  Pager: [phone] Between 7am to 7pm - call Pager - [phone]  After 7pm go to [email or web address] - password [email or web address]  Call night coverage person covering after 7pm

## 2016-06-18 NOTE — Progress Notes (Signed)
Patient Tx 2W05 from 2S. Assessment was done. Patient states no pain at this time. Patient sitting up in bed. Patient belongings at the bedside. Patient on 4L O2 nasal cannula. VSS. Call bell within reach. Will continue to monitor.   Domingo Dimes RN

## 2016-06-19 ENCOUNTER — Encounter (HOSPITAL_COMMUNITY)
Admission: AD | Disposition: A | Discharge status: Home / Self Care | Payer: Self-pay | Source: Ambulatory Visit | Attending: Internal Medicine

## 2016-06-19 DIAGNOSIS — I110 Hypertensive heart disease with heart failure: Secondary | ICD-10-CM | POA: Diagnosis present

## 2016-06-19 DIAGNOSIS — I214 Non-ST elevation (NSTEMI) myocardial infarction: Secondary | ICD-10-CM | POA: Diagnosis not present

## 2016-06-19 DIAGNOSIS — I4892 Unspecified atrial flutter: Secondary | ICD-10-CM | POA: Diagnosis present

## 2016-06-19 DIAGNOSIS — F329 Major depressive disorder, single episode, unspecified: Secondary | ICD-10-CM | POA: Diagnosis present

## 2016-06-19 DIAGNOSIS — L02412 Cutaneous abscess of left axilla: Secondary | ICD-10-CM | POA: Diagnosis not present

## 2016-06-19 DIAGNOSIS — E739 Lactose intolerance, unspecified: Secondary | ICD-10-CM | POA: Diagnosis present

## 2016-06-19 DIAGNOSIS — I5023 Acute on chronic systolic (congestive) heart failure: Secondary | ICD-10-CM | POA: Diagnosis not present

## 2016-06-19 DIAGNOSIS — J9601 Acute respiratory failure with hypoxia: Secondary | ICD-10-CM | POA: Diagnosis not present

## 2016-06-19 DIAGNOSIS — I480 Paroxysmal atrial fibrillation: Secondary | ICD-10-CM | POA: Diagnosis not present

## 2016-06-19 DIAGNOSIS — E1165 Type 2 diabetes mellitus with hyperglycemia: Secondary | ICD-10-CM | POA: Diagnosis present

## 2016-06-19 DIAGNOSIS — Z720 Tobacco use: Secondary | ICD-10-CM | POA: Diagnosis not present

## 2016-06-19 DIAGNOSIS — F17210 Nicotine dependence, cigarettes, uncomplicated: Secondary | ICD-10-CM | POA: Diagnosis present

## 2016-06-19 DIAGNOSIS — J690 Pneumonitis due to inhalation of food and vomit: Secondary | ICD-10-CM | POA: Diagnosis present

## 2016-06-19 DIAGNOSIS — I255 Ischemic cardiomyopathy: Secondary | ICD-10-CM | POA: Diagnosis present

## 2016-06-19 DIAGNOSIS — E7800 Pure hypercholesterolemia, unspecified: Secondary | ICD-10-CM | POA: Diagnosis present

## 2016-06-19 DIAGNOSIS — I97191 Other postprocedural cardiac functional disturbances following other surgery: Secondary | ICD-10-CM | POA: Diagnosis present

## 2016-06-19 DIAGNOSIS — N179 Acute kidney failure, unspecified: Secondary | ICD-10-CM | POA: Diagnosis present

## 2016-06-19 DIAGNOSIS — Z9119 Patient's noncompliance with other medical treatment and regimen: Secondary | ICD-10-CM | POA: Diagnosis not present

## 2016-06-19 LAB — BASIC METABOLIC PANEL
Anion gap: 8 (ref 5–15)
BUN: 14 mg/dL (ref 6–20)
CALCIUM: 8.9 mg/dL (ref 8.9–10.3)
CO2: 28 mmol/L (ref 22–32)
CREATININE: 1.06 mg/dL (ref 0.61–1.24)
Chloride: 102 mmol/L (ref 101–111)
GFR calc non Af Amer: >60 mL/min (ref >60)
Glucose, Bld: 200 mg/dL — ABNORMAL HIGH (ref 65–99)
Potassium: 4.4 mmol/L (ref 3.5–5.1)
SODIUM: 138 mmol/L (ref 135–145)

## 2016-06-19 LAB — VAS US DOPPLER PRE CABG
LEFT ECA DIAS: -16 cm/s
LEFT VERTEBRAL DIAS: -19 cm/s
Left CCA dist dias: -18 cm/s
Left CCA dist sys: -72 cm/s
Left CCA prox dias: -24 cm/s
Left CCA prox sys: -92 cm/s
Left ICA dist dias: -33 cm/s
Left ICA dist sys: -85 cm/s
Left ICA prox dias: -30 cm/s
Left ICA prox sys: -75 cm/s
RIGHT ECA DIAS: -11 cm/s
RIGHT VERTEBRAL DIAS: 11 cm/s
Right CCA prox dias: 17 cm/s
Right CCA prox sys: 74 cm/s
Right cca dist sys: -57 cm/s

## 2016-06-19 LAB — GLUCOSE, CAPILLARY
Glucose-Capillary: 202 mg/dL — ABNORMAL HIGH (ref 65–99)
Glucose-Capillary: 193 mg/dL — ABNORMAL HIGH (ref 65–99)

## 2016-06-19 LAB — CBC
HCT: 41.3 % (ref 39.0–52.0)
HEMOGLOBIN: 13.3 g/dL (ref 13.0–17.0)
MCH: 27.8 pg (ref 26.0–34.0)
MCHC: 32.2 g/dL (ref 30.0–36.0)
MCV: 86.2 fL (ref 78.0–100.0)
Platelets: 206 10*3/uL (ref 150–400)
RBC: 4.79 MIL/uL (ref 4.22–5.81)
RDW: 13 % (ref 11.5–15.5)
WBC: 13.3 10*3/uL — ABNORMAL HIGH (ref 4.0–10.5)

## 2016-06-19 SURGERY — CORONARY ARTERY BYPASS GRAFTING (CABG)
Anesthesia: General | Site: Chest

## 2016-06-19 MED ORDER — FUROSEMIDE 40 MG PO TABS: 40.0000 mg | ORAL_TABLET | Freq: Two times a day (BID) | ORAL | 3 refills | Status: DC

## 2016-06-19 MED ORDER — GLIPIZIDE 10 MG PO TABS: 10.0000 mg | ORAL_TABLET | Freq: Two times a day (BID) | ORAL | 3 refills | Status: DC

## 2016-06-19 MED ORDER — NITROGLYCERIN 0.4 MG SL SUBL: 0.4000 mg | SUBLINGUAL_TABLET | SUBLINGUAL | 12 refills | Status: DC | PRN

## 2016-06-19 MED ORDER — POTASSIUM CHLORIDE CRYS ER 20 MEQ PO TBCR: 40.0000 meq | EXTENDED_RELEASE_TABLET | Freq: Every day | ORAL | 3 refills | Status: DC

## 2016-06-19 MED ORDER — ATORVASTATIN CALCIUM 80 MG PO TABS: 80.0000 mg | ORAL_TABLET | Freq: Every day | ORAL | 3 refills | Status: DC

## 2016-06-19 MED ORDER — METFORMIN HCL 1000 MG PO TABS: 1000.0000 mg | ORAL_TABLET | Freq: Two times a day (BID) | ORAL | 3 refills | Status: DC

## 2016-06-19 MED ORDER — DOXYCYCLINE HYCLATE 100 MG PO CAPS: 100.0000 mg | ORAL_CAPSULE | Freq: Two times a day (BID) | ORAL | 0 refills | Status: DC

## 2016-06-19 MED ORDER — NICOTINE 7 MG/24HR TD PT24: 7.0000 mg | MEDICATED_PATCH | Freq: Every day | TRANSDERMAL | 0 refills | Status: DC

## 2016-06-19 MED ORDER — ASPIRIN 81 MG PO CHEW: 81.0000 mg | CHEWABLE_TABLET | Freq: Every day | ORAL | 3 refills | Status: AC

## 2016-06-19 MED ORDER — BLOOD GLUCOSE MONITOR KIT: PACK | 0 refills | Status: DC

## 2016-06-19 MED ORDER — BUPROPION HCL ER (XL) 150 MG PO TB24: 150.0000 mg | ORAL_TABLET | Freq: Every day | ORAL | 1 refills | Status: DC

## 2016-06-19 MED ORDER — APIXABAN 5 MG PO TABS: 5.0000 mg | ORAL_TABLET | Freq: Two times a day (BID) | ORAL | 4 refills | Status: DC

## 2016-06-19 MED ORDER — GUAIFENESIN-DM 100-10 MG/5ML PO SYRP: 5.0000 mL | ORAL_SOLUTION | ORAL | 0 refills | Status: DC | PRN

## 2016-06-19 MED ORDER — METOPROLOL TARTRATE 50 MG PO TABS: 50.0000 mg | ORAL_TABLET | Freq: Two times a day (BID) | ORAL | 3 refills | Status: DC

## 2016-06-19 MED ORDER — LISINOPRIL 5 MG PO TABS: 5.0000 mg | ORAL_TABLET | Freq: Every day | ORAL | 3 refills | Status: DC

## 2016-06-19 NOTE — Progress Notes (Addendum)
Inpatient Diabetes Program Recommendations  AACE/ADA: New Consensus Statement on Inpatient Glycemic Control (2015)  Target Ranges:  Prepandial:   less than 140 mg/dL      Peak postprandial:   less than 180 mg/dL (1-2 hours)      Critically ill patients:  140 - 180 mg/dL  Results for PAULINO, CORK (MRN [id number]) as of 06/19/2016 10:28  Ref. Range 06/10/2016 18:45  Hemoglobin A1C Latest Ref Range: 4.8 - 5.6 % 12.2 (H)   Results for GILMAN, OLAZABAL (MRN [id number]) as of 06/19/2016 10:28  Ref. Range 06/18/2016 08:12 06/18/2016 11:50 06/18/2016 16:36 06/18/2016 20:20 06/19/2016 06:08  Glucose-Capillary Latest Ref Range: 65 - 99 mg/dL 217 (H) 233 (H) 284 (H) 210 (H) 202 (H)   Inpatient Diabetes Program Recommendations:   Educated patient and daughter Roselyn Reef on insulin pen use at home. Reviewed contents of insulin teaching kit. Reviewed all steps of insulin pen including attachment of needle, 2-unit air shot, dialing up dose, giving injection, removing needle, disposal of sharps, storage of unused insulin, disposal of insulin etc. Daughter Roselyn Reef able to provide successful return demonstration. Reviewed troubleshooting with insulin pen. Also reviewed Signs/Symptoms of Hypoglycemia with patient and how to treat Hypoglycemia at home. Have asked RNs caring for patient to please allow patient to give all injections here in hospital as much as possible for practice. MD to give patient Rxs for insulin pens and insulin pen needles if going home on insulin. Spoke with Dr. Tana Coast @ 11:55 am and patient expressed he prefers to go home on orals and then followup with PCP. Agree with plans.  Thank you, Nani Gasser. Immaculate Crutcher, RN, MSN, CDE Inpatient Glycemic Control Team Team Pager [phone] (8am-5pm) 06/19/2016 10:30 AM

## 2016-06-19 NOTE — Progress Notes (Signed)
Hospital Problem List     Principal Problem:   Abscess of axilla, left Active Problems:   Type 2 diabetes mellitus, uncontrolled ([place])   Essential hypertension   Pure hypercholesterolemia   Acute respiratory failure with hypoxia (HCC)   NSTEMI (non-ST elevated myocardial infarction) (HCC)   Hypoxia   Smoking   Acute on chronic systolic CHF (congestive heart failure) ([place])   Atrial fibrillation [facility])    Patient Profile:   Primary Cardiologist: New - Dr. Debara Pickett  55 yo male w/ PMH of HTN, HLD, and poorly controlled DM who presented for I&D of left axillary abscess and noted to become lethargic and hypoxic. EKG showed new inferior Q-waves with lateral ST depression with troponin elevation to 16.06. Cath showed 3 vessel CAD.   Subjective   Denies any chest discomfort or palpitations. Says he is determined to get his diabetes under control.   Inpatient Medications    . apixaban  5 mg Oral BID  . aspirin  81 mg Oral Daily  . atorvastatin  80 mg Oral q1800  . buPROPion  150 mg Oral Daily  . furosemide  40 mg Oral BID  . insulin aspart  0-9 Units Subcutaneous TID WC  . insulin glargine  10 Units Subcutaneous Daily  . lisinopril  5 mg Oral Daily  . mouth rinse  15 mL Mouth Rinse BID  . metoprolol tartrate  50 mg Oral BID  . nicotine  7 mg Transdermal Daily  . potassium chloride  40 mEq Oral BID  . sodium chloride flush  3 mL Intravenous Q12H  . vancomycin  1,250 mg Intravenous Q12H    Vital Signs    Vitals:   06/18/16 1152 06/18/16 1742 06/18/16 1923 06/19/16 0538  BP: 108/77 111/72 110/70 108/72  Pulse: 75 76 80 86  Resp: 20  18 18   Temp: 98.1 F (36.7 C) 98.4 F (36.9 C) 98.5 F (36.9 C) 98.3 F (36.8 C)  TempSrc: Oral  Oral Oral  SpO2: 98% 98% 99% 100%  Weight:      Height:        Intake/Output Summary (Last 24 hours) at 06/19/16 0743 Last data filed at 06/18/16 1330  Gross per 24 hour  Intake              730 ml  Output             2600 ml  Net             -1870 ml   Filed Weights   06/12/16 1330 06/16/16 0400 06/18/16 0412  Weight: 161 lb 6 oz (73.2 kg) 158 lb 15.2 oz (72.1 kg) 163 lb 14.4 oz (74.3 kg)    Physical Exam    General: Well developed, well nourished, male appearing in no acute distress. Head: Normocephalic, atraumatic.  Neck: Supple without bruits, JVD not elevated. Lungs:  Resp regular and unlabored, mild rales at bases bilaterally. Heart: RRR, S1, S2, no S3, S4, or murmur; no rub. Abdomen: Soft, non-tender, non-distended with normoactive bowel sounds. No hepatomegaly. No rebound/guarding. No obvious abdominal masses. Extremities: No clubbing, cyanosis, or edema. Distal pedal pulses are 2+ bilaterally. Left axilla dressing in place. Neuro: Alert and oriented X 3. Moves all extremities spontaneously. Psych: Normal affect.  Labs    CBC  Recent Labs  06/18/16 0348 06/19/16 0442  WBC 14.2* 13.3*  HGB 12.7* 13.3  HCT 39.4 41.3  MCV 86.8 86.2  PLT 180 [id number]   Basic Metabolic Panel  Recent Labs  06/18/16 0348 06/19/16 0442  NA 139 138  K 4.8 4.4  CL 104 102  CO2 27 28  GLUCOSE 220* 200*  BUN 14 14  CREATININE 1.04 1.06  CALCIUM 8.6* 8.9  MG 1.8  --     Telemetry    NSR, HR in 60's - 70's. 3 beats NSVT.  ECG    No new tracings.    Cardiac Studies and Radiology    Ct Angio Chest Pe W Or Wo Contrast  Result Date: 06/11/2016 CLINICAL DATA:  Lethargy, hypoxia ; status post LEFT axillary abscess incision and drainage. History hypertension, hyperlipidemia, uncontrolled diabetes. EXAM: CT ANGIOGRAPHY CHEST WITH CONTRAST TECHNIQUE: Multidetector CT imaging of the chest was performed using the standard protocol during bolus administration of intravenous contrast. Multiplanar CT image reconstructions and MIPs were obtained to evaluate the vascular anatomy. CONTRAST:  100 cc Isovue 370 COMPARISON:  Chest radiographs August 24th 1719 hours FINDINGS: PULMONARY ARTERY: Adequate contrast opacification of the  pulmonary artery's. Main pulmonary artery is not enlarged. No pulmonary arterial filling defects to the level of the subsegmental branches. MEDIASTINUM: Heart size is mildly enlarged. No RIGHT heart strain. Scattered coronary artery calcifications. No pericardial effusion. Thoracic aorta is normal course and caliber, trace calcific atherosclerosis at the aortic arch. LUNGS: Tracheobronchial tree is patent, no pneumothorax. Dense hypo enhancing consolidation in the lower lobes bilaterally air bronchograms. SOFT TISSUES AND OSSEOUS STRUCTURES: Included view of the abdomen is unremarkable. LEFT axilla skin defect with small surrounding lymph nodes compatible with history of abscess drainage, no focal fluid collection. IMPRESSION: No acute pulmonary embolism. Bilateral lower lobe pneumonia. Status post LEFT axillary incision and drainage, no focal fluid collection. Electronically Signed   By: Elon Alas M.D.   On: 06/11/2016 18:48   Dg Chest Port 1 View  Result Date: 06/18/2016 CLINICAL DATA:  Wound infection.  Pneumonia.  History of CHF. EXAM: PORTABLE CHEST 1 VIEW COMPARISON:  06/15/2016 . FINDINGS: Mediastinum hilar structures are unremarkable. Cardiomegaly. Slight worsening of bilateral pulmonary infiltrates consistent with pulmonary edema and/or pneumonia. Small bilateral pleural effusions noted on today's exam . No pneumothorax. IMPRESSION: Slight worsening of bilateral bilateral pulmonary edema. Small bilateral pleural effusions. Electronically Signed   By: Marcello Moores  Register   On: 06/18/2016 07:13   Dg Chest Port 1 View  Result Date: 06/15/2016 CLINICAL DATA:  Shortness of breath. Cough. Hypoxia. Acute myocardial infarction. EXAM: PORTABLE CHEST 1 VIEW COMPARISON:  Chest x-ray dated 06/13/2016 FINDINGS: There is persistent bilateral pulmonary edema with Kerley B-lines at the bases. Small left pleural effusion. Heart size is normal. Slight pulmonary vascular congestion. No acute bone abnormality.  Arthritic changes of the right shoulder with evidence consistent with chronic rotator cuff disease. IMPRESSION: Persistent bilateral pulmonary edema with increased small left effusion. Electronically Signed   By: Lorriane Shire M.D.   On: 06/15/2016 13:31   Dg Chest Port 1 View  Result Date: 06/13/2016 CLINICAL DATA:  Shortness of breath. EXAM: PORTABLE CHEST 1 VIEW COMPARISON:  06/11/2016 FINDINGS: Mild cardiac enlargement. Bilateral pleural effusions and moderate interstitial edema is identified compatible with CHF. IMPRESSION: Moderate CHF. Electronically Signed   By: Kerby Moors M.D.   On: 06/13/2016 12:37   Dg Chest Port 1 View  Result Date: 06/11/2016 CLINICAL DATA:  Hypoxia, weakness today. History of hypertension, diabetes. EXAM: PORTABLE CHEST 1 VIEW COMPARISON:  Chest radiograph December 07, 2011 FINDINGS: The cardiac silhouette is mildly enlarged. Pulmonary vascular congestion and mild interstitial prominence without pleural effusion  or focal consolidation. No pneumothorax tiny scattered granulomata. Soft tissue planes included osseous structures are nonsuspicious, high-riding RIGHT humeral head suggests old rotator cuff injury. IMPRESSION: Mild cardiomegaly. Pulmonary vascular congestion. Interstitial prominence favoring pulmonary edema, less likely atypical pneumonia. Electronically Signed   By: Elon Alas M.D.   On: 06/11/2016 17:47    Echocardiogram: 06/12/2016 Study Conclusions  - Left ventricle: The cavity size was normal. There was mild   concentric hypertrophy. Systolic function was severely reduced.   The estimated ejection fraction was in the range of 25% to 30%.   Akinesis of the basal-midinferolateral and inferior myocardium.   Severe hypokinesis of the lateral myocardium. ; consistent with   infarction in the distribution of the right coronary and left   circumflex coronary artery. Features are consistent with a   pseudonormal left ventricular filling pattern,  with concomitant   abnormal relaxation and increased filling pressure (grade 2   diastolic dysfunction). - Mitral valve: There was mild regurgitation. - Pericardium, extracardiac: A trivial pericardial effusion was   identified.  Cardiac Catheterization: 06/16/2016  Ost RCA to Prox RCA lesion, 50 %stenosed.  Prox RCA to Mid RCA lesion, 50 %stenosed.  Ost Ramus to Ramus lesion, 100 %stenosed.  Prox Cx lesion, 85 %stenosed.  Ost 2nd Mrg to 2nd Mrg lesion, 100 %stenosed.  Ost 4th Mrg to 4th Mrg lesion, 100 %stenosed.  Ost 3rd Mrg to 3rd Mrg lesion, 100 %stenosed.  Prox LAD lesion, 70 %stenosed.  Mid LAD to Dist LAD lesion, 50 %stenosed.  Dist LAD lesion, 50 %stenosed.  Ost LPDA to LPDA lesion, 100 %stenosed.   Severe multivessel CAD with diffuse narrowing of the LAD with 60-70% proximal stenoses, 50% mid and distal diffuse stenoses; total occlusion of a high marginal/ramus intermediate vessel with distal retrograde filling; dominant left circumflex vessel with 85% very proximal stenosis, total occlusion of marginal branches and distal AV groove circumflex occlusion with faint collateralization to the PDA/PLA-like vessel, and proximal to mid 50% stenoses in a small nondominant RCA.  Prior echocardiographic documentation of an ejection fraction of 25-30%.  LVEDP 16 mmHg.  RECOMMENDATION: There appears to be significant thrombus present in the circumflex territory.  Heparin will be resumed.  Surgical consultation will be obtained for consideration of CABG revascularization.  Assessment & Plan    1. Paroxysmal AFib with RVR - Previously had 6.5 second post termination pause. Digoxin stopped and metoprolol increased.  If he were to have recurrent  afib may need to consider amiodarone. No repeat episodes noted on telemetry in the past few days.  - This patients CHA2DS2-VASc Score and unadjusted Ischemic Stroke Rate (% per year) is equal to 4.8 % stroke rate/year from a score of  4 (CHF, HTN, DM, Vascular). On Eliquis for anticoagulation.   2. NSTEMI  - Peak troponin 16.06. EKG with minimal ST depression laterally. EF is markedly reduced. Cardiac cath showed severe 3 vessel CAD. Not a candidate for PCI. Ultimately CABG would be his best option but he is not a candidate at this time due to active infections.  - Plan CABG at later date-post hospitalization once clinical status improved.   3. Acute systolic CHF/ Ischemic cardiomyopathy - echo this admission shows EF of 25% to 30% with akinesis of the basal-midinferolateral and inferior myocardium. - On Lopressor and Lisinopril (consider switching Lopressor to Toprol-XL or Coreg with reduced EF). CXR on 8/31 showed increased pulmonary edema with effusions. Started on PO Lasix 40mg  BID. Monitor I/O and daily weights. Sodium restriction.  4. Acute hypoxic respiratory failure  - secondary to PNA (by CT)/aspiration and/or CHF. Clinically improved but still requiring oxygen. On 3L [place].   5. MRSA Left axillary abscess - s/p surgical drainage. Per Surgical team  6. Type 2 DM  - on insulin  7. HTN - well-controlled. Does not allow for titration of medications at this time as SBP in low-100's.   8. Tobacco abuse - Counseled on cessation.  9. Hyperlipidemia - started on high-intensity statin   Arna Medici , PA-C 7:43 AM 06/19/2016 Pager: [phone] Patient seen and examined and history reviewed. Agree with above findings and plan. Good response to diuretics yesterday. Less pulmonary congestion on exam. No edema. I/O negative 1870 cc. BMET stable. Patient stable for discharge today from our standpoint. Will arrange outpatient follow up.  Najmah Carradine [place], [place] 06/19/2016 9:24 AM

## 2016-06-19 NOTE — Progress Notes (Signed)
Patient is waiting for clothes. Ride/familt is at bedside, discharge instruction reviewed and understood. IV's taken out and telemetry box taken off.

## 2016-06-19 NOTE — Care Management Note (Signed)
Case Management Note Derrick Gibbons RN, BSN Unit 2W-Case Manager [phone]   Patient Details  Name: [name] MRN: [id number] Date of Birth: Apr 13, 1961  Subjective/Objective:  Pt admitted with abscess of axilla s/p I&D                  Action/Plan: PTA pt lived at home with wife- plan is to return home with wife and [facility] services- see previous CM notes for benefit checks and and [place] referrals.   Expected Discharge Date:     06/19/16             Expected Discharge Plan:  [place]  In-House Referral:     Discharge planning Services  CM Consult, Follow-up appt scheduled, Medication Assistance  Post Acute Care Choice:  Home Health Choice offered to:  Spouse, Patient  DME Arranged:    DME Agency:     HH Arranged:  RN [place] Agency:  [place]  Status of Service:  Completed, signed off  If discussed at [place] of Stay Meetings, dates discussed:    Additional Comments:  06/19/16- 1220- Ashling Roane RN, CM- pt for d/c home today, f/u appointments have been made- orders placed for [facility]- wound care needs- per Cardiac Rehab pt independent and does not need HHPT and did not qualify for home 02- spoke with pt and wife at bedside- wife has copay card for insulin did not have copay cards for  Eliquis- 30 day free and copay assist cards provided for Eliquis and discussed what copay with insurance would be without card. $48.15/mo per insurance check. - also confirmed with pt and wife [place] agency choice for [facility]- spoke with Tiffany at [facility] regarding [facility] and pt's discharge for today. No other CM needs noted.   Dawayne Patricia, RN 06/19/2016, 12:23 PM

## 2016-06-19 NOTE — Discharge Summary (Signed)
Physician Discharge Summary   Patient ID: [name] MRN: [id number] DOB/AGE: 01-04-1961 55 y.o.  Admit date: 06/10/2016 Discharge date: 06/19/2016  Primary Care Physician:  Kennyth Arnold, FNP  Discharge Diagnoses:    . Abscess of axilla, left . Pure hypercholesterolemia . Type 2 diabetes mellitus, uncontrolled ([place]) . Essential hypertension . Acute respiratory failure with hypoxia ([place]) . NSTEMI (non-ST elevated myocardial infarction) ([place]) . Smoking . Acute on chronic systolic CHF (congestive heart failure) ([place]) . Atrial fibrillation [facility])   Consults:  Cardiology Diabetic coordinator Gen. Surgery Cardiothoracic surgery  Recommendations for Outpatient Follow-up:  1. Home health RN arranged for wound check and care 2. Please repeat CBC/BMET at next visit 3. Please check hemoglobin A1c in 4 weeks. Patient had not been taking any diabetic medications for over a year.   DIET: Carb modified diet    Allergies:   Allergies  Allergen Reactions  . Latex Rash     DISCHARGE MEDICATIONS: Current Discharge Medication List    START taking these medications   Details  apixaban (ELIQUIS) 5 MG TABS tablet Take 1 tablet (5 mg total) by mouth 2 (two) times daily. Qty: 60 tablet, Refills: 4    aspirin 81 MG chewable tablet Chew 1 tablet (81 mg total) by mouth daily. Qty: 30 tablet, Refills: 3    atorvastatin (LIPITOR) 80 MG tablet Take 1 tablet (80 mg total) by mouth at bedtime. Qty: 30 tablet, Refills: 3    blood glucose meter kit and supplies KIT Dispense based on patient and insurance preference. Use up to four times daily as directed. (FOR ICD-9 250.00, 250.01). Qty: 1 each, Refills: 0    doxycycline (VIBRAMYCIN) 100 MG capsule Take 1 capsule (100 mg total) by mouth 2 (two) times daily. X 5 days Qty: 10 capsule, Refills: 0    furosemide (LASIX) 40 MG tablet Take 1 tablet (40 mg total) by mouth 2 (two) times daily. Qty: 60 tablet, Refills: 3     guaiFENesin-dextromethorphan (ROBITUSSIN DM) 100-10 MG/5ML syrup Take 5 mLs by mouth every 4 (four) hours as needed for cough. Qty: 118 mL, Refills: 0    metoprolol (LOPRESSOR) 50 MG tablet Take 1 tablet (50 mg total) by mouth 2 (two) times daily. Qty: 60 tablet, Refills: 3    nicotine (NICODERM CQ - DOSED IN MG/24 HR) 7 mg/24hr patch Place 1 patch (7 mg total) onto the skin daily. Qty: 28 patch, Refills: 0    nitroGLYCERIN (NITROSTAT) 0.4 MG SL tablet Place 1 tablet (0.4 mg total) under the tongue every 5 (five) minutes as needed for chest pain. Qty: 30 tablet, Refills: 12    potassium chloride SA (K-DUR,KLOR-CON) 20 MEQ tablet Take 2 tablets (40 mEq total) by mouth daily. Qty: 60 tablet, Refills: 3      CONTINUE these medications which have CHANGED   Details  buPROPion (WELLBUTRIN XL) 150 MG 24 hr tablet Take 1 tablet (150 mg total) by mouth daily. Qty: 30 tablet, Refills: 1    glipiZIDE (GLUCOTROL) 10 MG tablet Take 1 tablet (10 mg total) by mouth 2 (two) times daily before a meal. Qty: 60 tablet, Refills: 3    lisinopril (PRINIVIL,ZESTRIL) 5 MG tablet Take 1 tablet (5 mg total) by mouth daily. Qty: 30 tablet, Refills: 3    metFORMIN (GLUCOPHAGE) 1000 MG tablet Take 1 tablet (1,000 mg total) by mouth 2 (two) times daily with a meal. Qty: 60 tablet, Refills: 3      STOP taking these medications  Aspirin-Salicylamide-Caffeine (BC HEADACHE POWDER PO)      simvastatin (ZOCOR) 20 MG tablet          Brief H and P: For complete details please refer to admission H and P, but in briefJames F Thomasis a 55 year old male with history of diabetes mellitus, type II, hypertension, hyperlipidemia who was admitted for incision and drainage of left axilla abscess. Patient developed acute hypoxic respiratory failure on 8/24 and was found to have an NSTEMI, elevation in troponin with associated new Q waves in inferior leads initially without chest pain. Patient was started on heparin  and cardiology consulted for management, started on BIPAP and transferred to [facility] from [facility] cardiac cath Since transfer, had AFib RVR, requiring cardizem drip off and on, limited by pauses and RVR. Underwent cardiac cath on 8/29,  Hospital Course:   Acute Hypoxic resp failure - due to MI/CHF and aspiration pneumonia and suspected underlying COPD(>50ppd smoker), Acute systolic CHF - Patient was placed on BiPAP, currently weaned off, on O2. Home O2 evaluation will be done prior to discharge. - On vancomycin and levofloxacin (completed). Chest x-ray 8/31 showed worsening pulmonary edema, given 40 mg IV , then placed on the oral Lasix. - suspect he will need Home O2 due to undiagnosed COPD, home O2 evaluation prior to discharge will be done.  NSTEMI - ? Post op silent MI, troponin trending down from peak of16 -ECHO with EF of 25% and significant WMA -Akinesis of the basal-midinferolateral and inferior myocardium - continue ASA/Metoprolol - Patient underwent cardiac cath on 8/29 which showed severe multivessel CAD with diffuse narrowing of LAD, 60-70% proximal stenosis, 50% mid and distal diffuse stenosis, total occlusion off high marginal, ramus intermediate vessel, left circumflex 85% stenosis, total occlusion of marginal branches and distal AV groove circumflex, mid 50% stenosis of small nondominant RCA. Recommended CABG -Cardiothoracic surgery was consulted on the patient, seen by Dr Nils Pyle, recommended CABG at a later date, delayed due to active infections. Patient has follow-up appointment with cardiothoracic surgery outpatient.  Acute systolic CHF - Improving, patient was placed on oral Lasix with potassium supplementation. - Patient has follow-up appointment with cardiology  MRSA L axillar abscess -s/p I&D 8/23 by Dr.Toth -Cx with MRSA, on IV Vanc D8 -No further antibiotics needed at the time of discharge per surgery. I had discussed with ID, Dr. Megan Salon, recommended  5-7 days of oral doxycycline if patient still has any fevers or leukocytosis. WBC count improving  Afib with RVR - Currently stable. Previously had 6.5 second posttraumatic pauses, digoxin was stopped and metoprolol was increased, per cardiology if recurrent A. fib, may need to consider amiodarone.  - Patient started on eliquis today  Essential Hypertension Controlled, Lisinopril held on admission, resumed by Cardiology  Diabetes mellitus, type 2 Uncontrolled, was not taking any meds for over a 1year, HbA1c is 12.2. Patient was started on a Lantus and subcutaneous insulin while inpatient. Discussed in detail with the patient regarding oral hypoglycemics vs insulin. He also does not check his blood sugars daily. Had a long discussion with the patient this morning regarding compliance and better control of his diabetes mellitus. Patient was given prescription for glucometer with lancets strips. He agreed to continue metformin and glipizide, repeat A1c in 4 weeks. If no improvement in hemoglobin A1c, he will consider insulin at that time.   Hypercholesterolemia -Simvastatin changed to atorvastatin.  Tobacco abuse -counseled  Day of Discharge BP 98/67 (BP Location: Right Arm)   Pulse 73  Temp 98.5 F (36.9 C) (Oral)   Resp 18   Ht _0  (1.676 m)   Wt 74.3 kg (163 lb 14.4 oz)   SpO2 98%   BMI 26.45 kg/m   Physical Exam: General: Alert and awake oriented x3 not in any acute distress. HEENT: anicteric sclera, pupils reactive to light and accommodation CVS: S1-S2 clear no murmur rubs or gallops Chest: clear to auscultation bilaterally, no wheezing rales or rhonchi Abdomen: soft nontender, nondistended, normal bowel sounds Extremities: no cyanosis, clubbing or edema noted bilaterally Neuro: Cranial nerves II-XII intact, no focal neurological deficits   The results of significant diagnostics from this hospitalization (including imaging, microbiology, ancillary and laboratory)  are listed below for reference.    LAB RESULTS: Basic Metabolic Panel:  Recent Labs Lab 06/18/16 0348 06/19/16 0442  NA 139 138  K 4.8 4.4  CL 104 102  CO2 27 28  GLUCOSE 220* 200*  BUN 14 14  CREATININE 1.04 1.06  CALCIUM 8.6* 8.9  MG 1.8  --    Liver Function Tests: No results for input(s): AST, ALT, ALKPHOS, BILITOT, PROT, ALBUMIN in the last 168 hours. No results for input(s): LIPASE, AMYLASE in the last 168 hours. No results for input(s): AMMONIA in the last 168 hours. CBC:  Recent Labs Lab 06/18/16 0348 06/19/16 0442  WBC 14.2* 13.3*  HGB 12.7* 13.3  HCT 39.4 41.3  MCV 86.8 86.2  PLT 180 206   Cardiac Enzymes:  Recent Labs Lab 06/13/16 1610 06/14/16 0325  TROPONINI 3.63* 3.20*   BNP: Invalid input(s): POCBNP CBG:  Recent Labs Lab 06/19/16 0608 06/19/16 1114  GLUCAP 202* 193*    Significant Diagnostic Studies:  Ct Angio Chest Pe W Or Wo Contrast  Result Date: 06/11/2016 CLINICAL DATA:  Lethargy, hypoxia ; status post LEFT axillary abscess incision and drainage. History hypertension, hyperlipidemia, uncontrolled diabetes. EXAM: CT ANGIOGRAPHY CHEST WITH CONTRAST TECHNIQUE: Multidetector CT imaging of the chest was performed using the standard protocol during bolus administration of intravenous contrast. Multiplanar CT image reconstructions and MIPs were obtained to evaluate the vascular anatomy. CONTRAST:  100 cc Isovue 370 COMPARISON:  Chest radiographs August 24th 1719 hours FINDINGS: PULMONARY ARTERY: Adequate contrast opacification of the pulmonary artery's. Main pulmonary artery is not enlarged. No pulmonary arterial filling defects to the level of the subsegmental branches. MEDIASTINUM: Heart size is mildly enlarged. No RIGHT heart strain. Scattered coronary artery calcifications. No pericardial effusion. Thoracic aorta is normal course and caliber, trace calcific atherosclerosis at the aortic arch. LUNGS: Tracheobronchial tree is patent, no  pneumothorax. Dense hypo enhancing consolidation in the lower lobes bilaterally air bronchograms. SOFT TISSUES AND OSSEOUS STRUCTURES: Included view of the abdomen is unremarkable. LEFT axilla skin defect with small surrounding lymph nodes compatible with history of abscess drainage, no focal fluid collection. IMPRESSION: No acute pulmonary embolism. Bilateral lower lobe pneumonia. Status post LEFT axillary incision and drainage, no focal fluid collection. Electronically Signed   By: Elon Alas M.D.   On: 06/11/2016 18:48   Dg Chest Port 1 View  Result Date: 06/11/2016 CLINICAL DATA:  Hypoxia, weakness today. History of hypertension, diabetes. EXAM: PORTABLE CHEST 1 VIEW COMPARISON:  Chest radiograph December 07, 2011 FINDINGS: The cardiac silhouette is mildly enlarged. Pulmonary vascular congestion and mild interstitial prominence without pleural effusion or focal consolidation. No pneumothorax tiny scattered granulomata. Soft tissue planes included osseous structures are nonsuspicious, high-riding RIGHT humeral head suggests old rotator cuff injury. IMPRESSION: Mild cardiomegaly. Pulmonary vascular congestion. Interstitial prominence favoring pulmonary  edema, less likely atypical pneumonia. Electronically Signed   By: Elon Alas M.D.   On: 06/11/2016 17:47     Procedures   Left Heart Cath and Coronary Angiography  Conclusion     Ost RCA to Prox RCA lesion, 50 %stenosed.  Prox RCA to Mid RCA lesion, 50 %stenosed.  Ost Ramus to Ramus lesion, 100 %stenosed.  Prox Cx lesion, 85 %stenosed.  Ost 2nd Mrg to 2nd Mrg lesion, 100 %stenosed.  Ost 4th Mrg to 4th Mrg lesion, 100 %stenosed.  Ost 3rd Mrg to 3rd Mrg lesion, 100 %stenosed.  Prox LAD lesion, 70 %stenosed.  Mid LAD to Dist LAD lesion, 50 %stenosed.  Dist LAD lesion, 50 %stenosed.  Ost LPDA to LPDA lesion, 100 %stenosed.   Severe multivessel CAD with diffuse narrowing of the LAD with 60-70% proximal stenoses, 50%  mid and distal diffuse stenoses; total occlusion of a high marginal/ramus intermediate vessel with distal retrograde filling; dominant left circumflex vessel with 85% very proximal stenosis, total occlusion of marginal branches and distal AV groove circumflex occlusion with faint collateralization to the PDA/PLA-like vessel, and proximal to mid 50% stenoses in a small nondominant RCA.  Prior echocardiographic documentation of an ejection fraction of 25-30%.  LVEDP 16 mmHg.  RECOMMENDATION: There appears to be significant thrombus present in the circumflex territory.  Heparin will be resumed.  Surgical consultation will be obtained for consideration of CABG revascularization.     Disposition and Follow-up: Discharge Instructions    (HEART FAILURE PATIENTS) Call MD:  Anytime you have any of the following symptoms: 1) 3 pound weight gain in 24 hours or 5 pounds in 1 week 2) shortness of breath, with or without a dry hacking cough 3) swelling in the hands, feet or stomach 4) if you have to sleep on extra pillows at night in order to breathe.    Complete by:  As directed   Ambulatory referral to Nutrition and Diabetic Education    Complete by:  As directed   Diet Carb Modified    Complete by:  As directed   Discharge instructions    Complete by:  As directed   It is VERY IMPORTANT that you follow up with a PCP on a regular basis.  Check your blood glucoses before each meal and at bedtime and maintain a log of your readings.  Bring this log with you when you follow up with your PCP so that he or she can adjust your diabetes medications at your follow up visit.   Increase activity slowly    Complete by:  As directed       DISPOSITION: home    [facility] [place] Follow up on 06/26/2016.   Specialty:  Family Medicine Why:  appt at 11:30 am on 05-26-16 Contact information: [place]  [place] Van Kerby Less III, MD Follow up on 07/09/2016.   Specialty:  Cardiothoracic Surgery Why:  07/09/2016 at 3 pm for f/u appt Contact information: [address] [place] [address] [place], [place] Follow up on 07/06/2016.   Specialties:  Cardiology, Radiology Why:  [place] on 07/06/2016 at 1:30PM.  Contact information: [address] [place] [address] [phone]            Time spent on Discharge: 35 mins   Signed:  Syncere Eble M.D. [facility] 06/19/2016, 11:56 AM Pager: [phone]

## 2016-06-19 NOTE — Progress Notes (Signed)
CARDIAC REHAB PHASE I   PRE:  Rate/Rhythm: 74 SR  BP:  Sitting: 98/61        SaO2: 93 RA  MODE:  Ambulation: 550 ft   POST:  Rate/Rhythm: 80 SR  BP:  Sitting: 101/66         SaO2: 94 RA  Pt ambulated 550 ft on RA, IV, handheld assist, steady gait, tolerated well with no complaints. Pt O2 sats 93-94% on RA during ambulation, pt does not meet requirements for home oxgen. Completed MI/CHF education as well as cardiac surgery pre-op education with pt and pt's daughter at bedsdie.  Reviewed risk factors, tobacco cessation, MI book, anti-platelet therapy, activity restrictions, ntg, heart healthy diet, carb counting, sodium restrictions, CHF booklet and zone tool, daily weights, "off the beat" book, cardiac surgery booklet, cardiac surgery guidelines, IS, and sternal precautions. Pt and daughter verbalized understanding, receptive to education. Pt not appropriate for referral to phase 2 cardiac rehab at this time as he is to return for surgery. Will send referral at that time. Pt to bed per pt request after walk, call bell within reach.   UR:[id number] Lenna Sciara, RN, BSN 06/19/2016 12:02 PM

## 2016-06-24 ENCOUNTER — Telehealth: Payer: Self-pay

## 2016-06-24 NOTE — Telephone Encounter (Signed)
Spoke with patient regarding FMLA forms. Patient was informed that [employer] mailed ROI form that needs to be completed along with a form of payment to begin the completion process. Patient said, he wasn't sure if he is going to wait on mail or come by office.

## 2016-06-25 ENCOUNTER — Encounter: Payer: Self-pay | Admitting: Skilled Nursing Facility1

## 2016-06-25 ENCOUNTER — Encounter: Payer: BLUE CROSS/BLUE SHIELD | Attending: Family | Admitting: Skilled Nursing Facility1

## 2016-06-25 DIAGNOSIS — IMO0001 Reserved for inherently not codable concepts without codable children: Secondary | ICD-10-CM

## 2016-06-25 DIAGNOSIS — E1165 Type 2 diabetes mellitus with hyperglycemia: Secondary | ICD-10-CM | POA: Insufficient documentation

## 2016-06-25 NOTE — Patient Instructions (Addendum)
-  Try to flavor your water with items such as lemon, cinnamon, cucumber, fruit -Always bring your meter with you everywhere you go -Always Properly dispose of your needles:  -Discard in a hard plastic/metal container with a lid (something the needle can't puncture)  -Write Do Not Recycle on the outside of the container  -Example: A laundry detergent bottle -Never use the same needle more than once -Eat about 3 carbohydrate choices for each meal and 2 for each snack -A meal: carbohydrates, protein, vegetable -A snack: A Fruit OR Vegetable AND Protein  -Try to be more active -Always pay attention to your body keeping watchful of possible low blood sugar (below 70) or high blood sugar (above 200) -Check your feet every day -Try frying your egg in olive oil -Pick the cereal with the most fiber -

## 2016-06-25 NOTE — Progress Notes (Signed)
Diabetes Self-Management Education  Visit Type: First/Initial  Appt. Start Time: 3:15 Appt. End Time: 4:45  06/26/2016  Mr. [name], identified by name and date of birth, is a 55 y.o. male with a diagnosis of Diabetes: Type 2.   ASSESSMENT  Height 5\' 6"  (1.676 m). There is no height or weight on file to calculate BMI.  Pt states he was just in the hospital for 10 days due to a heart attack. Pt states he has lost 6 pounds in 4 days. Pt states he has been getting dizzy and nauseated-he des check his blood sugar which is in the 90's. Pt states he hate how tired and exhausted he feels all the time and is having trouble accepting he needs to give his body time to heal. Pt was accompanied by his very supportive daughter who looks after him.      Diabetes Self-Management Education - 06/25/16 1528      Visit Information   Visit Type First/Initial     Initial Visit   Diabetes Type Type 2   Are you currently following a meal plan? No   Are you taking your medications as prescribed? Yes   Date Diagnosed 2013     Health Coping   How would you rate your overall health? Fair     Psychosocial Assessment   Patient Belief/Attitude about Diabetes Afraid   Self-care barriers None   Self-management support Family   Other persons present Family Member   Patient Concerns Nutrition/Meal planning;Glycemic Control   Special Needs None     Pre-Education Assessment   Patient understands the diabetes disease and treatment process. Needs Instruction   Patient understands incorporating nutritional management into lifestyle. Needs Instruction   Patient undertands incorporating physical activity into lifestyle. Needs Instruction   Patient understands using medications safely. Needs Instruction   Patient understands monitoring blood glucose, interpreting and using results Needs Instruction   Patient understands prevention, detection, and treatment of acute complications. Needs Instruction    Patient understands prevention, detection, and treatment of chronic complications. Needs Instruction   Patient understands how to develop strategies to address psychosocial issues. Needs Instruction   Patient understands how to develop strategies to promote health/change behavior. Needs Instruction     Complications   Last HgB A1C per patient/outside source 12.2 %   How often do you check your blood sugar? 3-4 times/day   Fasting Blood glucose range (mg/dL) 130-179   Have you had a dilated eye exam in the past 12 months? Yes   Have you had a dental exam in the past 12 months? No   Are you checking your feet? No     Dietary Intake   Breakfast 2-3 peices of bacon with boiled eggs   Lunch meat and green beans or sweet potatoes   Dinner meat and green beans or sweet potatoes   Beverage(s) propel, sprite zero, water     Exercise   Exercise Type ADL's     Patient Education   Previous Diabetes Education --  4 years ago   Disease state  Definition of diabetes, type 1 and 2, and the diagnosis of diabetes;Factors that contribute to the development of diabetes   Nutrition management  Role of diet in the treatment of diabetes and the relationship between the three main macronutrients and blood glucose level;Food label reading, portion sizes and measuring food.;Carbohydrate counting;Reviewed blood glucose goals for pre and post meals and how to evaluate the patients' food intake on their blood glucose level.;Meal  options for control of blood glucose level and chronic complications.   Physical activity and exercise  Role of exercise on diabetes management, blood pressure control and cardiac health.;Identified with patient nutritional and/or medication changes necessary with exercise.;Helped patient identify appropriate exercises in relation to his/her diabetes, diabetes complications and other health issue.   Medications Reviewed patients medication for diabetes, action, purpose, timing of dose and side  effects.   Monitoring Purpose and frequency of SMBG.;Yearly dilated eye exam;Daily foot exams;Identified appropriate SMBG and/or A1C goals.   Acute complications Taught treatment of hypoglycemia - the 15 rule.;Discussed and identified patients' treatment of hyperglycemia.   Chronic complications Relationship between chronic complications and blood glucose control;Lipid levels, blood glucose control and heart disease;Dental care;Reviewed with patient heart disease, higher risk of, and prevention;Retinopathy and reason for yearly dilated eye exams;Identified and discussed with patient  current chronic complications;Assessed and discussed foot care and prevention of foot problems   Psychosocial adjustment Worked with patient to identify barriers to care and solutions;Role of stress on diabetes;Identified and addressed patients feelings and concerns about diabetes     Individualized Goals (developed by patient)   Nutrition Follow meal plan discussed;Adjust meds/carbs with exercise as discussed   Monitoring  test blood glucose pre and post meals as discussed   Reducing Risk do foot checks daily;increase portions of olive oil in diet;examine blood glucose patterns     Post-Education Assessment   Patient understands the diabetes disease and treatment process. Demonstrates understanding / competency   Patient understands incorporating nutritional management into lifestyle. Demonstrates understanding / competency   Patient undertands incorporating physical activity into lifestyle. Demonstrates understanding / competency   Patient understands using medications safely. Demonstrates understanding / competency   Patient understands monitoring blood glucose, interpreting and using results Demonstrates understanding / competency   Patient understands prevention, detection, and treatment of acute complications. Demonstrates understanding / competency   Patient understands prevention, detection, and treatment of  chronic complications. Demonstrates understanding / competency   Patient understands how to develop strategies to address psychosocial issues. Demonstrates understanding / competency   Patient understands how to develop strategies to promote health/change behavior. Demonstrates understanding / competency     Outcomes   Expected Outcomes Demonstrated interest in learning. Expect positive outcomes   Future DMSE PRN   Program Status Completed      Individualized Plan for Diabetes Self-Management Training:   Learning Objective:  Patient will have a greater understanding of diabetes self-management. Patient education plan is to attend individual and/or group sessions per assessed needs and concerns.   Plan:   Patient Instructions  -Try to flavor your water with items such as lemon, cinnamon, cucumber, fruit -Always bring your meter with you everywhere you go -Always Properly dispose of your needles:  -Discard in a hard plastic/metal container with a lid (something the needle can't puncture)  -Write Do Not Recycle on the outside of the container  -Example: A laundry detergent bottle -Never use the same needle more than once -Eat about 3 carbohydrate choices for each meal and 2 for each snack -A meal: carbohydrates, protein, vegetable -A snack: A Fruit OR Vegetable AND Protein  -Try to be more active -Always pay attention to your body keeping watchful of possible low blood sugar (below 70) or high blood sugar (above 200) -Check your feet every day -Try frying your egg in olive oil -Pick the cereal with the most fiber -   Expected Outcomes:  Demonstrated interest in learning. Expect positive outcomes  Education  material provided: Living Well with Diabetes, Meal plan card, My Plate and Snack sheet  If problems or questions, patient to contact team via:  Phone  Future DSME appointment: PRN

## 2016-06-26 ENCOUNTER — Ambulatory Visit (INDEPENDENT_AMBULATORY_CARE_PROVIDER_SITE_OTHER): Payer: BLUE CROSS/BLUE SHIELD | Admitting: Adult Health

## 2016-06-26 ENCOUNTER — Encounter: Payer: Self-pay | Admitting: Adult Health

## 2016-06-26 ENCOUNTER — Telehealth: Payer: Self-pay | Admitting: Adult Health

## 2016-06-26 VITALS — BP 90/58 | HR 83 | Temp 97.9°F | Ht 66.0 in | Wt 151.6 lb

## 2016-06-26 DIAGNOSIS — Z7689 Persons encountering health services in other specified circumstances: Secondary | ICD-10-CM

## 2016-06-26 DIAGNOSIS — IMO0001 Reserved for inherently not codable concepts without codable children: Secondary | ICD-10-CM

## 2016-06-26 DIAGNOSIS — I10 Essential (primary) hypertension: Secondary | ICD-10-CM | POA: Diagnosis not present

## 2016-06-26 DIAGNOSIS — T148XXA Other injury of unspecified body region, initial encounter: Secondary | ICD-10-CM | POA: Insufficient documentation

## 2016-06-26 DIAGNOSIS — E119 Type 2 diabetes mellitus without complications: Secondary | ICD-10-CM | POA: Diagnosis not present

## 2016-06-26 DIAGNOSIS — I214 Non-ST elevation (NSTEMI) myocardial infarction: Secondary | ICD-10-CM | POA: Diagnosis not present

## 2016-06-26 DIAGNOSIS — I5023 Acute on chronic systolic (congestive) heart failure: Secondary | ICD-10-CM | POA: Diagnosis not present

## 2016-06-26 DIAGNOSIS — I480 Paroxysmal atrial fibrillation: Secondary | ICD-10-CM | POA: Diagnosis not present

## 2016-06-26 DIAGNOSIS — Z720 Tobacco use: Secondary | ICD-10-CM

## 2016-06-26 DIAGNOSIS — L02412 Cutaneous abscess of left axilla: Secondary | ICD-10-CM

## 2016-06-26 DIAGNOSIS — E1165 Type 2 diabetes mellitus with hyperglycemia: Secondary | ICD-10-CM | POA: Diagnosis not present

## 2016-06-26 DIAGNOSIS — Z7189 Other specified counseling: Secondary | ICD-10-CM | POA: Diagnosis not present

## 2016-06-26 DIAGNOSIS — F17200 Nicotine dependence, unspecified, uncomplicated: Secondary | ICD-10-CM

## 2016-06-26 LAB — CBC WITH DIFFERENTIAL/PLATELET
BASOS PCT: 0.3 % (ref 0.0–3.0)
Basophils Absolute: 0 10*3/uL (ref 0.0–0.1)
EOS PCT: 1.3 % (ref 0.0–5.0)
Eosinophils Absolute: 0.2 10*3/uL (ref 0.0–0.7)
HCT: 45 % (ref 39.0–52.0)
HEMOGLOBIN: 15.2 g/dL (ref 13.0–17.0)
LYMPHS ABS: 2.8 10*3/uL (ref 0.7–4.0)
Lymphocytes Relative: 21.8 % (ref 12.0–46.0)
MCHC: 33.7 g/dL (ref 30.0–36.0)
MCV: 84.3 fl (ref 78.0–100.0)
MONO ABS: 0.9 10*3/uL (ref 0.1–1.0)
Monocytes Relative: 7.1 % (ref 3.0–12.0)
NEUTROS PCT: 69.5 % (ref 43.0–77.0)
Neutro Abs: 8.8 10*3/uL — ABNORMAL HIGH (ref 1.4–7.7)
Platelets: 403 10*3/uL — ABNORMAL HIGH (ref 150.0–400.0)
RBC: 5.34 Mil/uL (ref 4.22–5.81)
RDW: 13.1 % (ref 11.5–15.5)
WBC: 12.6 10*3/uL — ABNORMAL HIGH (ref 4.0–10.5)

## 2016-06-26 LAB — BASIC METABOLIC PANEL
BUN: 38 mg/dL — AB (ref 6–23)
CHLORIDE: 95 meq/L — AB (ref 96–112)
CO2: 29 meq/L (ref 19–32)
Calcium: 9.2 mg/dL (ref 8.4–10.5)
Creatinine, Ser: 1.46 mg/dL (ref 0.40–1.50)
GFR: 53.27 mL/min — ABNORMAL LOW (ref >60.00)
GLUCOSE: 248 mg/dL — AB (ref 70–99)
POTASSIUM: 5.2 meq/L — AB (ref 3.5–5.1)
Sodium: 133 mEq/L — ABNORMAL LOW (ref 135–145)

## 2016-06-26 MED ORDER — DOXYCYCLINE HYCLATE 100 MG PO CAPS: 100.0000 mg | ORAL_CAPSULE | Freq: Two times a day (BID) | ORAL | 0 refills | Status: DC

## 2016-06-26 NOTE — Telephone Encounter (Signed)
Pt need his antibiotic called in to [facility] in [place] please.

## 2016-06-26 NOTE — Progress Notes (Signed)
Pre visit review using our clinic review tool, if applicable. No additional management support is needed unless otherwise documented below in the visit note. 

## 2016-06-26 NOTE — Patient Instructions (Signed)
It was great meeting you today  1. Stop lisinopril and continue to monitor blood pressure at home 2. Decrease Lasix to daily, if you start to become short of breath then restart twice a day  3. I have sent in 5 more days of doxycycline 4. Follow up on 9 /15/2017 for wound check  5. Follow up on 07/17/2016 for A1c check

## 2016-06-26 NOTE — Progress Notes (Signed)
Patient presents to clinic today to establish care. He is a pleasant 55 year old male who  has a past medical history of Abrasion (L FOREARM); Atrial fibrillation ([place]); Diabetes mellitus without complication ([place]); Headache(784.0); Hematuria, microscopic ("SINCE I WAS A KID"); Hyperlipidemia; Hypertension; Lactose intolerance; NSTEMI (non-ST elevated myocardial infarction) ([place]) (2017); Respiratory failure ([place]); Rotator cuff tear (RIGHT); and Tobacco use. He presents with his daughter to this visit.   Most recently he was admitted to the hospital from 06/10/2016 and discharged on 06/19/2016. Per H&P was admitted for incision and drainage of left axilla abscess. Patient developed acute hypoxic respiratory failure on 8/24 and was found to have an NSTEMI, elevation in troponin with associated new Q waves. Patient underwent cardiac cath on 8/29 which showed severe multivessel CAD with diffuse narrowing of LAD, 60-70% proximal stenosis, 50% mid and distal diffuse stenosis, total occlusion off high marginal, ramus intermediate vessel, left circumflex 85% stenosis, total occlusion of marginal branches and distal AV groove circumflex, mid 50% stenosis of small nondominant RCA. Recommended CABG -Cardiothoracic surgery was consulted on the patient, seen by Dr Nils Pyle, recommended CABG at a later date, delayed due to active infections. Patient has follow-up appointment with cardiothoracic surgery outpatient.  His A1C was found to be 12.2, as he had not taken any diabetes medications for over a year. He was started on Lantus while inpatient. Lantus was stopped on discharge and he was switched to oral agents ( metformin and glipizide)  Afib with RVR - Currently stable. Previously had 6.5 second posttraumatic pauses, digoxin was stopped and metoprolol was increased, per cardiology if recurrent A. fib, may need to consider amiodarone.  - Patient started on eliquis while in the hospital.   Today in the office he  reports that he is starting to feel a little bit better. His strength is starting to return but still has trouble walking more than 100 yards before becoming winded. He was concerned about weight loss after being discharged from the hospital but over the last few days his weight has been steadily climbing.   He has not smoked a cigarette since being discharged.   He has met with a diabetic educator and now feels as though he has a grasp on what to eat and what not to eat. His blood sugars have been slowly lowering. Per his log they have been coming down from mid 300's to more suitable 150', he continues to take oral agents.    Acute Concerns: [facility] Follow up Axilla wound  - Has been taking doxycycline 120m BID x 5 days. Has family and home care RN changing packing. Continues to have drainage from wound.   Chronic Issues: Diabetes  HTN NSTEMI -   Health Maintenance: Dental --Has not seen in some time [facility] -- Routine Visit  Immunizations -- UTD  Colonoscopy -- Needs to have one     Past Medical History:  Diagnosis Date  . Abrasion L FOREARM  . Atrial fibrillation ([facility]   . Diabetes mellitus without complication ([facility]   . Headache(784.0)   . Hematuria, microscopic "SINCE I WAS A KID"  . Hyperlipidemia   . Hypertension   . Lactose intolerance   . NSTEM[place] (non-ST elevated myocardial infarction) ([facility] 2017  . Respiratory failure ([facility]   . Rotator cuff tear RIGHT  . Tobacco use     Past Surgical History:  Procedure Laterality Date  . CARDIAC CATHETERIZATION N/A 06/16/2016   Procedure: Left Heart Cath and Coronary  Angiography;  Surgeon: Troy Sine, MD;  Location: [facility];  Service: Cardiovascular;  Laterality: N/A;  . HERNIA REPAIR  X2  . INCISION AND DRAINAGE ABSCESS Left 06/10/2016   Procedure: INCISION AND DRAINAGE LEFT AXILLARY ABSCESS;  Surgeon: Autumn Messing III, MD;  Location: [facility];  Service: General;  Laterality: Left;  . ROTATOR CUFF  REPAIR Bilateral     Current Outpatient Prescriptions on File Prior to Visit  Medication Sig Dispense Refill  . apixaban (ELIQUIS) 5 MG TABS tablet Take 1 tablet (5 mg total) by mouth 2 (two) times daily. 60 tablet 4  . aspirin 81 MG chewable tablet Chew 1 tablet (81 mg total) by mouth daily. 30 tablet 3  . atorvastatin (LIPITOR) 80 MG tablet Take 1 tablet (80 mg total) by mouth at bedtime. 30 tablet 3  . blood glucose meter kit and supplies KIT Dispense based on patient and insurance preference. Use up to four times daily as directed. (FOR ICD-9 250.00, 250.01). 1 each 0  . buPROPion (WELLBUTRIN XL) 150 MG 24 hr tablet Take 1 tablet (150 mg total) by mouth daily. 30 tablet 1  . furosemide (LASIX) 40 MG tablet Take 1 tablet (40 mg total) by mouth 2 (two) times daily. 60 tablet 3  . glipiZIDE (GLUCOTROL) 10 MG tablet Take 1 tablet (10 mg total) by mouth 2 (two) times daily before a meal. 60 tablet 3  . guaiFENesin-dextromethorphan (ROBITUSSIN DM) 100-10 MG/5ML syrup Take 5 mLs by mouth every 4 (four) hours as needed for cough. 118 mL 0  . lisinopril (PRINIVIL,ZESTRIL) 5 MG tablet Take 1 tablet (5 mg total) by mouth daily. 30 tablet 3  . metFORMIN (GLUCOPHAGE) 1000 MG tablet Take 1 tablet (1,000 mg total) by mouth 2 (two) times daily with a meal. 60 tablet 3  . metoprolol (LOPRESSOR) 50 MG tablet Take 1 tablet (50 mg total) by mouth 2 (two) times daily. 60 tablet 3  . nicotine (NICODERM CQ - DOSED IN MG/24 HR) 7 mg/24hr patch Place 1 patch (7 mg total) onto the skin daily. 28 patch 0  . nitroGLYCERIN (NITROSTAT) 0.4 MG SL tablet Place 1 tablet (0.4 mg total) under the tongue every 5 (five) minutes as needed for chest pain. 30 tablet 12  . potassium chloride SA (K-DUR,KLOR-CON) 20 MEQ tablet Take 2 tablets (40 mEq total) by mouth daily. 60 tablet 3   No current facility-administered medications on file prior to visit.     Allergies  Allergen Reactions  . Latex Rash    Family History    Problem Relation Age of Onset  . Hypertension Father   . Sudden death Father   . Diabetes Father   . Hypertension Mother   . Heart attack Sister     Social History   Social History  . Marital status: Married    Spouse name: N/A  . Number of children: N/A  . Years of education: N/A   Occupational History  . Not on file.   Social History Main Topics  . Smoking status: Current Every Day Smoker    Packs/day: 1.50  . Smokeless tobacco: Never Used  . Alcohol use No  . Drug use: No  . Sexual activity: Yes   Other Topics Concern  . Not on file   Social History Narrative  . No narrative on file    Review of Systems  Constitutional: Positive for malaise/fatigue and weight loss.  HENT: Negative.   Respiratory: Negative.   Cardiovascular: Negative.  Gastrointestinal: Negative.   Genitourinary: Negative.   Musculoskeletal: Negative.   Skin: Negative.   Neurological: Positive for weakness.  Psychiatric/Behavioral: Negative.   All other systems reviewed and are negative.   BP (!) 90/58 (BP Location: Right Arm, Patient Position: Sitting, Cuff Size: Normal)   Pulse 83   Temp 97.9 F (36.6 C) (Oral)   Ht '5\' 6"'  (1.676 m)   Wt 151 lb 9.6 oz (68.8 kg)   SpO2 95%   BMI 24.47 kg/m   Physical Exam  Constitutional: He is oriented to person, place, and time and well-developed, well-nourished, and in no distress. No distress.  Appears tired and weak   HENT:  Head: Normocephalic and atraumatic.  Right Ear: External ear normal.  Left Ear: External ear normal.  Nose: Nose normal.  Mouth/Throat: Oropharynx is clear and moist. No oropharyngeal exudate.  Eyes: Conjunctivae and EOM are normal. Pupils are equal, round, and reactive to light. Right eye exhibits no discharge. Left eye exhibits no discharge. No scleral icterus.  Neck: Normal range of motion. Neck supple. No JVD present. No tracheal deviation present. No thyromegaly present.  Cardiovascular: Normal rate, regular  rhythm, normal heart sounds and intact distal pulses.  Exam reveals no gallop and no friction rub.   No murmur heard. Pulmonary/Chest: Effort normal and breath sounds normal. No stridor. No respiratory distress. He has no wheezes. He exhibits no tenderness.  Abdominal: Soft. Bowel sounds are normal. He exhibits no mass. There is no tenderness. There is no rebound and no guarding.  Musculoskeletal: Normal range of motion. He exhibits no edema, tenderness or deformity.  Lymphadenopathy:    He has no cervical adenopathy.  Neurological: He is alert and oriented to person, place, and time. He has normal reflexes. He displays normal reflexes. No cranial nerve deficit. He exhibits normal muscle tone. Gait normal. Coordination normal. GCS score is 15.  Skin: Skin is warm and dry. No rash noted. He is not diaphoretic. No erythema. There is pallor.  No draining wound in left axilla from prior I&D. Appears to be healing well. Packing remains intact   Psychiatric: Mood, memory, affect and judgment normal.  Nursing note and vitals reviewed.  Assessment/Plan: 1. Encounter to establish care - Will get scheduled for a physical in the future - He needs colonoscopy but will wait until he is stronger  - Follow up in one week for wound check and 3 weeks for repeat A1c   2. Acute on chronic systolic CHF (congestive heart failure) (HCC) - Appears to be resolving   3. Paroxysmal atrial fibrillation (HCC) - Normal sinus today.  - Continue with Liquids and Metoprolol.  - Follow up with cardiology   4. Essential hypertension - Hypotensive today and reports being hypotensive at home. Will have him come off lisinopril and monitor BP - Follow up in one week - Basic metabolic panel - CBC with Differential/Platelet  5. NSTEMI (non-ST elevated myocardial infarction) ([place]) - Follow up with Cardiology and CT surgery as directed - Basic metabolic panel - CBC with Differential/Platelet  6. Uncontrolled type 2  diabetes mellitus without complication, without long-term current use of insulin ([place]) - Will recheck A1c in 3 weeks  - ONETOUCH DELICA LANCETS 89Q MISC; USE UP TO 4 TIMES DAILY AS DIRECTED; Refill: 2 - ONE TOUCH ULTRA TEST test strip; USE UP TO 4 TIMES DAILY AS DIRECTED; Refill: 2 - Basic metabolic panel - CBC with Differential/Platelet  7. Smoking - Has not had a cigarette since being discharged.  Continue with Wellbutrin and patch.   8. Abscess of axilla, left - Continue with dressing changes as directed.  - doxycycline (VIBRAMYCIN) 100 MG capsule; Take 1 capsule (100 mg total) by mouth 2 (two) times daily.  Dispense: 10 capsule; Refill: 0 - Follow up with any signs of infection - Follow up in one week for wound check   [name], NP

## 2016-06-29 ENCOUNTER — Other Ambulatory Visit: Payer: Self-pay

## 2016-06-29 DIAGNOSIS — L02412 Cutaneous abscess of left axilla: Secondary | ICD-10-CM

## 2016-06-29 MED ORDER — DOXYCYCLINE HYCLATE 100 MG PO CAPS: 100.0000 mg | ORAL_CAPSULE | Freq: Two times a day (BID) | ORAL | 0 refills | Status: DC

## 2016-06-29 NOTE — Telephone Encounter (Signed)
Previous Rx was sent to [facility] called and cancelled that Rx.

## 2016-06-29 NOTE — Telephone Encounter (Signed)
Rx has been sent in. 

## 2016-07-02 ENCOUNTER — Ambulatory Visit (INDEPENDENT_AMBULATORY_CARE_PROVIDER_SITE_OTHER): Payer: BLUE CROSS/BLUE SHIELD | Admitting: Adult Health

## 2016-07-02 ENCOUNTER — Encounter: Payer: Self-pay | Admitting: Adult Health

## 2016-07-02 VITALS — BP 112/74 | Temp 98.6°F | Ht 66.0 in | Wt 154.7 lb

## 2016-07-02 DIAGNOSIS — Z5189 Encounter for other specified aftercare: Secondary | ICD-10-CM | POA: Diagnosis not present

## 2016-07-02 DIAGNOSIS — E1165 Type 2 diabetes mellitus with hyperglycemia: Secondary | ICD-10-CM

## 2016-07-02 DIAGNOSIS — IMO0001 Reserved for inherently not codable concepts without codable children: Secondary | ICD-10-CM

## 2016-07-02 DIAGNOSIS — I952 Hypotension due to drugs: Secondary | ICD-10-CM

## 2016-07-02 DIAGNOSIS — E875 Hyperkalemia: Secondary | ICD-10-CM

## 2016-07-02 DIAGNOSIS — D729 Disorder of white blood cells, unspecified: Secondary | ICD-10-CM

## 2016-07-02 LAB — CBC
HEMATOCRIT: 42 % (ref 39.0–52.0)
HEMOGLOBIN: 14.1 g/dL (ref 13.0–17.0)
MCHC: 33.6 g/dL (ref 30.0–36.0)
MCV: 83.4 fl (ref 78.0–100.0)
Platelets: 256 10*3/uL (ref 150.0–400.0)
RBC: 5.03 Mil/uL (ref 4.22–5.81)
RDW: 13 % (ref 11.5–15.5)
WBC: 11.9 10*3/uL — ABNORMAL HIGH (ref 4.0–10.5)

## 2016-07-02 LAB — BASIC METABOLIC PANEL
BUN: 18 mg/dL (ref 6–23)
CALCIUM: 9.3 mg/dL (ref 8.4–10.5)
CO2: 31 mEq/L (ref 19–32)
CREATININE: 1.09 mg/dL (ref 0.40–1.50)
Chloride: 104 mEq/L (ref 96–112)
GFR: 74.63 mL/min (ref >60.00)
GLUCOSE: 183 mg/dL — AB (ref 70–99)
Potassium: 5.2 mEq/L — ABNORMAL HIGH (ref 3.5–5.1)
SODIUM: 142 meq/L (ref 135–145)

## 2016-07-02 NOTE — Progress Notes (Signed)
Subjective:    Patient ID: [name], male    DOB: 09-12-1961, 55 y.o.   MRN: [id number]  HPI  55 year old male who presents to the office today for one week follow up regarding wound care and hypertension. I last saw him after being admitted to the hospital for incision and drainage of left axilla abscess. Patient developed acute hypoxic respiratory failure on 8/24 and was found to have an NSTEMI, elevation in troponin with associated new Q waves. Patient underwent cardiac cath on 8/29 which showed severe multivessel CAD with diffuse narrowing of LAD, 60-70% proximal stenosis, 50% mid and distal diffuse stenosis, total occlusion off high marginal, ramus intermediate vessel, left circumflex 85% stenosis, total occlusion of marginal branches and distal AV groove circumflex, mid 50% stenosis of small nondominant RCA  His A1C was found to be 12.2, as he had not taken any diabetes medications for over a year. He was started on Lantus while inpatient. Lantus was stopped on discharge and he was switched to oral agents ( metformin and glipizide)  I had removed lisinopril from his medication regimen last week due to hypotension and prescribed another round of doxycycline for his wound from I&D in left axilla.   Today in the office he reports that every day he is improving. His strength is starting to come back and he is walking farther everyday. He also reports that is wound appears to be healing well and he is no longer having any drainage from it. He continues to be smoke free.   Blood work last week showed a slightly elevated white count of 12.6, hyperkalemia of 5.2 , decreased GFR and sodium levels.   He continues to work on diet and endorses that his blood sugars continue to improve but he is having nights where sugars are in the high 200's. He contributes this to diet.   Overall he is feeling much better but does complain of trouble falling asleep at night.   Review of Systems    Constitutional: Positive for activity change and fatigue. Negative for appetite change, chills, diaphoresis and fever.  HENT: Negative.   Respiratory: Negative.   Cardiovascular: Negative.   Genitourinary: Negative.   Musculoskeletal: Negative.   Skin: Positive for wound.  Neurological: Negative.   Psychiatric/Behavioral: Positive for sleep disturbance.  All other systems reviewed and are negative.      Objective:   Physical Exam  Constitutional: He is oriented to person, place, and time. He appears well-developed and well-nourished. No distress.  Cardiovascular: Normal rate, regular rhythm, normal heart sounds and intact distal pulses.  Exam reveals no gallop and no friction rub.   No murmur heard. Pulmonary/Chest: Effort normal and breath sounds normal. No respiratory distress. He has no wheezes. He has no rales. He exhibits no tenderness.  Neurological: He is alert and oriented to person, place, and time. He has normal reflexes.  Skin: Skin is warm and dry. No rash noted. He is not diaphoretic. No erythema. No pallor.  Wounds in left axilla appear to be healing well. Slight redness around incision borders. No purulent drainage noted. No signs of infection noted  Psychiatric: He has a normal mood and affect. His behavior is normal. Judgment and thought content normal.  Nursing note and vitals reviewed.     Assessment & Plan:  1. Abnormal white blood cell (WBC) count - CBC (no diff)  2. Hyperkalemia - Basic metabolic panel - Consider cutting back on K supplement  3. Visit for  wound check - Wound appears to be healing well - can apply antibiotic ointment around wound edge - Advised caregiver to reducing packing each day  - Will follow up with in 2 weeks   4. Hypotension due to drugs - 112/74 today  - Continue with metoprolol    5. Uncontrolled type 2 diabetes mellitus without complication, without long-term current use of insulin ([place]) - Will check A1c in two weeks -  Consider insulin therapy - Continue to work on diet and exercise  [name], NP

## 2016-07-03 ENCOUNTER — Telehealth: Payer: Self-pay | Admitting: Adult Health

## 2016-07-03 NOTE — Telephone Encounter (Signed)
Called patient back and left message with daughter for him to call back and ask for Ledell Noss because Jerene Pitch is not here today. Daughter wanted me to give her results but told her I was unable( not on DPR ). Calling to give results to patient.

## 2016-07-03 NOTE — Telephone Encounter (Signed)
Pt is returning brooke  call

## 2016-07-06 ENCOUNTER — Encounter: Payer: Self-pay | Admitting: Student

## 2016-07-06 ENCOUNTER — Ambulatory Visit (INDEPENDENT_AMBULATORY_CARE_PROVIDER_SITE_OTHER): Payer: BLUE CROSS/BLUE SHIELD | Admitting: Student

## 2016-07-06 VITALS — BP 114/70 | HR 81 | Ht 66.0 in | Wt 156.0 lb

## 2016-07-06 DIAGNOSIS — E1165 Type 2 diabetes mellitus with hyperglycemia: Secondary | ICD-10-CM

## 2016-07-06 DIAGNOSIS — I214 Non-ST elevation (NSTEMI) myocardial infarction: Secondary | ICD-10-CM

## 2016-07-06 DIAGNOSIS — IMO0001 Reserved for inherently not codable concepts without codable children: Secondary | ICD-10-CM

## 2016-07-06 DIAGNOSIS — I10 Essential (primary) hypertension: Secondary | ICD-10-CM | POA: Diagnosis not present

## 2016-07-06 DIAGNOSIS — I480 Paroxysmal atrial fibrillation: Secondary | ICD-10-CM | POA: Diagnosis not present

## 2016-07-06 DIAGNOSIS — E875 Hyperkalemia: Secondary | ICD-10-CM

## 2016-07-06 DIAGNOSIS — Z79899 Other long term (current) drug therapy: Secondary | ICD-10-CM | POA: Diagnosis not present

## 2016-07-06 DIAGNOSIS — I255 Ischemic cardiomyopathy: Secondary | ICD-10-CM

## 2016-07-06 NOTE — Patient Instructions (Signed)
Medication Instructions:  NO CHANGES.  Labwork: Your physician recommends that you return for lab work in: [place].   Follow-Up: Your physician recommends that you schedule a follow-up appointment in: 3 MONTHS WITH DR HILTY.  If you need a refill on your cardiac medications before your next appointment, please call your pharmacy.

## 2016-07-06 NOTE — Progress Notes (Signed)
Cardiology Office Note    Date:  07/06/2016   ID:  [name], DOB [birth year], MRN [id number]  PCP:  [name], NP  Cardiologist:  Dr. Debara Pickett   Chief Complaint  Patient presents with  . Follow-up    Recent Hospitalization. Feeling fatigued.    History of Present Illness:    [name] is a 55 y.o. male with past medical history of CAD (s/p NSTEMI in 05/2016 with cath showing severe 3 vessel CAD), ischemic cardiomyopathy (EF 25-35% by echo in 05/2014), PAF (on Eliquis), Type 2 DM, HTN, HLD, COPD and tobacco use who presents today for hospital follow-up.  He was recently admitted from 8/23 - 06/19/2016 for I&D of a left axillary abscess. The day following the procedure, he was noted to become lethargic and hypoxic. Was started on IV Antibiotics for possible aspiration PNA. EKG showed new inferior Q-waves and lateral ST depression with a peak troponin of 16.06. Echo with reduced EF of 25-35% and akinesis of the basal-midinferolateral and inferior myocardium. Cardiac cath on 8/29 showed severe multivessel CAD with diffuse narrowing of the LAD with 60-70% proximal stenoses, total occlusion of a high marginal/ramus intermediate vessel with distal retrograde filling, 85% LCx stenosis, and 50% stenoses in a small nondominant RCA. CABG was recommended. He was not thought to be a current CABG candidate with his active MRSA wound along his axilla.   Hospital course was complicated by atrial fibrillation with RVR. He had a post-termination pause of 6.5 seconds and Digoxin was discontinued with him being continued on Lopressor. Also found to have an Hgb A1c of 12.2 and started on oral antiglycemic medications at time of hospital discharge.  Has established care with a PCP and was continued on Doxycycline for his left axilla abscess. Has been on oral medications for his DM and is to have repeat A1c in two weeks. Lisinopril was discontinued secondary to hypotension.   In talking with the patient  today, he reports overall doing well. Has quit smoking and is currently using Nicotine patch. Has been checking his blood sugars multiple times per day with readings improved from the 300's - 400's, down to 150's - 180's. He feels jittery and shaky at times but denies any episodes of hypoglycemia.   Has been walking multiple times per day and recently returned from a beach trip where he went for long walks and did not experience any anginal symptoms. Has not required any SL NTG, but he carries this with him on a daily basis. Denies any shortness of breath with exertion, orthopnea, PND, or lower extremity edema. Has occasional palpitations but says these are not as severe as when he was in atrial fibrillation while hospitalized, for they only last a few seconds then spontaneously resolve. Says he feels fatigued and wants to be more active, but does not have the energy. Misses working at his job. Has appointment with Dr. Prescott Gum next week to discuss potential surgical dates.   Past Medical History:  Diagnosis Date  . Abrasion L FOREARM  . Atrial fibrillation ([place])    a. initially occurring in post-op setting in 05/2016, started on Eliquis  . CAD (coronary artery disease)    a. 05/2016: NSTEMI, cath showing 3v disease with CABG recommended once MRSA infection resolved.  . Diabetes mellitus without complication ([place])   . Headache(784.0)   . Hematuria, microscopic "SINCE I WAS A KID"  . Hyperlipidemia   . Hypertension   . Ischemic cardiomyopathy  a. 05/2016: echo w/ EF of 25-35% and akinesis of the basal-midinferolateral and inferior myocardium.  . Lactose intolerance   . Respiratory failure ([place])   . Rotator cuff tear RIGHT  . Tobacco use    a. quit in 05/2016 following MI    Past Surgical History:  Procedure Laterality Date  . CARDIAC CATHETERIZATION N/A 06/16/2016   Procedure: Left Heart Cath and Coronary Angiography;  Surgeon: Troy Sine, MD;  Location: [facility];   Service: Cardiovascular;  Laterality: N/A;  . HERNIA REPAIR  X2  . INCISION AND DRAINAGE ABSCESS Left 06/10/2016   Procedure: INCISION AND DRAINAGE LEFT AXILLARY ABSCESS;  Surgeon: Autumn Messing III, MD;  Location: [facility];  Service: General;  Laterality: Left;  . ROTATOR CUFF REPAIR Bilateral     Current Medications: Outpatient Medications Prior to Visit  Medication Sig Dispense Refill  . apixaban (ELIQUIS) 5 MG TABS tablet Take 1 tablet (5 mg total) by mouth 2 (two) times daily. 60 tablet 4  . aspirin 81 MG chewable tablet Chew 1 tablet (81 mg total) by mouth daily. 30 tablet 3  . atorvastatin (LIPITOR) 80 MG tablet Take 1 tablet (80 mg total) by mouth at bedtime. 30 tablet 3  . blood glucose meter kit and supplies KIT Dispense based on patient and insurance preference. Use up to four times daily as directed. (FOR ICD-9 250.00, 250.01). 1 each 0  . buPROPion (WELLBUTRIN XL) 150 MG 24 hr tablet Take 1 tablet (150 mg total) by mouth daily. 30 tablet 1  . doxycycline (VIBRAMYCIN) 100 MG capsule Take 1 capsule (100 mg total) by mouth 2 (two) times daily. 10 capsule 0  . glipiZIDE (GLUCOTROL) 10 MG tablet Take 1 tablet (10 mg total) by mouth 2 (two) times daily before a meal. 60 tablet 3  . guaiFENesin-dextromethorphan (ROBITUSSIN DM) 100-10 MG/5ML syrup Take 5 mLs by mouth every 4 (four) hours as needed for cough. 118 mL 0  . metFORMIN (GLUCOPHAGE) 1000 MG tablet Take 1 tablet (1,000 mg total) by mouth 2 (two) times daily with a meal. 60 tablet 3  . metoprolol (LOPRESSOR) 50 MG tablet Take 1 tablet (50 mg total) by mouth 2 (two) times daily. 60 tablet 3  . nicotine (NICODERM CQ - DOSED IN MG/24 HR) 7 mg/24hr patch Place 1 patch (7 mg total) onto the skin daily. 28 patch 0  . nitroGLYCERIN (NITROSTAT) 0.4 MG SL tablet Place 1 tablet (0.4 mg total) under the tongue every 5 (five) minutes as needed for chest pain. 30 tablet 12  . ONE TOUCH ULTRA TEST test strip USE UP TO 4 TIMES DAILY AS DIRECTED  2  .  ONETOUCH DELICA LANCETS 46N MISC USE UP TO 4 TIMES DAILY AS DIRECTED  2  . furosemide (LASIX) 40 MG tablet Take 1 tablet (40 mg total) by mouth 2 (two) times daily. 60 tablet 3  . potassium chloride SA (K-DUR,KLOR-CON) 20 MEQ tablet Take 2 tablets (40 mEq total) by mouth daily. 60 tablet 3   No facility-administered medications prior to visit.      Allergies:   Latex   Social History   Social History  . Marital status: Married    Spouse name: N/A  . Number of children: N/A  . Years of education: N/A   Social History Main Topics  . Smoking status: Current Every Day Smoker    Packs/day: 1.50  . Smokeless tobacco: Never Used  . Alcohol use No  . Drug use: No  .  Sexual activity: Yes   Other Topics Concern  . None   Social History Narrative  . None     Family History:  The patient's family history includes Diabetes in his father; Heart attack in his sister; Hypertension in his father and mother; Sudden death in his father.   Review of Systems:   Please see the history of present illness.    Review of Systems  Constitution: Positive for weakness. Negative for chills, decreased appetite and fever.  Cardiovascular: Positive for irregular heartbeat. Negative for chest pain, dyspnea on exertion, near-syncope, orthopnea, palpitations and paroxysmal nocturnal dyspnea.  Respiratory: Negative for shortness of breath, sputum production and wheezing.   Skin: Positive for poor wound healing. Negative for color change.  Gastrointestinal: Negative for bloating, constipation, diarrhea, hematemesis, hematochezia, nausea and vomiting.  Genitourinary: Negative for flank pain and hematuria.  Neurological: Negative for loss of balance and vertigo.  Psychiatric/Behavioral: Negative for altered mental status. The patient is not nervous/anxious.    All other systems reviewed and are negative.   Physical Exam:    VS:  BP 114/70   Pulse 81   Ht _0  (1.676 m)   Wt 156 lb (70.8 kg)   BMI  25.18 kg/m     General: Well developed, well nourished,male appearing in no acute distress. Head: Normocephalic, atraumatic, sclera non-icteric, no xanthomas, nares are without discharge.  Neck: No carotid bruits. JVD not elevated.  Lungs: Respirations regular and unlabored, without wheezes or rales.  Heart: Regular rate and rhythm. No S3 or S4.  No murmur, no rubs, or gallops appreciated. Abdomen: Soft, non-tender, non-distended with normoactive bowel sounds. No hepatomegaly. No rebound/guarding. No obvious abdominal masses. Msk:  Strength and tone appear normal for age. No joint deformities or effusions. Extremities: No clubbing or cyanosis. No edema.  Distal pedal pulses are 2+ bilaterally. Wound dressing in place along left axilla.  Neuro: Alert and oriented X 3. Moves all extremities spontaneously. No focal deficits noted. Psych:  Responds to questions appropriately with a normal affect. Skin: No rashes or lesions noted  Wt Readings from Last 3 Encounters:  07/06/16 156 lb (70.8 kg)  07/02/16 154 lb 11.2 oz (70.2 kg)  06/26/16 151 lb 9.6 oz (68.8 kg)      Studies/Labs Reviewed:   EKG:  EKG is ordered today.  The ekg ordered today demonstrates NSR, HR 81, with ST depression in inferior and lateral leads. QTc 448 ms.   Recent Labs: 06/11/2016: ALT 31 06/12/2016: B Natriuretic Peptide 559.6 06/18/2016: Magnesium 1.8 07/02/2016: BUN 18; Creatinine, Ser 1.09; Hemoglobin 14.1; Platelets 256.0; Potassium 5.2; Sodium 142   Lipid Panel    Component Value Date/Time   CHOL 185 05/23/2013 1058   TRIG 146.0 05/23/2013 1058   HDL 32.80 (L) 05/23/2013 1058   CHOLHDL 6 05/23/2013 1058   VLDL 29.2 05/23/2013 1058   LDLCALC 123 (H) 05/23/2013 1058   LDLDIRECT 169.5 08/23/2012 1628    Additional studies/ records that were reviewed today include:  Echocardiogram: 06/12/2016 Study Conclusions  - Left ventricle: The cavity size was normal. There was mild   concentric hypertrophy. Systolic  function was severely reduced.   The estimated ejection fraction was in the range of 25% to 30%.   Akinesis of the basal-midinferolateral and inferior myocardium.   Severe hypokinesis of the lateral myocardium. ; consistent with   infarction in the distribution of the right coronary and left   circumflex coronary artery. Features are consistent with a   pseudonormal left  ventricular filling pattern, with concomitant   abnormal relaxation and increased filling pressure (grade 2   diastolic dysfunction). - Mitral valve: There was mild regurgitation. - Pericardium, extracardiac: A trivial pericardial effusion was   identified.  Cardiac Catheterization: 06/16/2016  Ost RCA to Prox RCA lesion, 50 %stenosed.  Prox RCA to Mid RCA lesion, 50 %stenosed.  Ost Ramus to Ramus lesion, 100 %stenosed.  Prox Cx lesion, 85 %stenosed.  Ost 2nd Mrg to 2nd Mrg lesion, 100 %stenosed.  Ost 4th Mrg to 4th Mrg lesion, 100 %stenosed.  Ost 3rd Mrg to 3rd Mrg lesion, 100 %stenosed.  Prox LAD lesion, 70 %stenosed.  Mid LAD to Dist LAD lesion, 50 %stenosed.  Dist LAD lesion, 50 %stenosed.  Ost LPDA to LPDA lesion, 100 %stenosed.   Severe multivessel CAD with diffuse narrowing of the LAD with 60-70% proximal stenoses, 50% mid and distal diffuse stenoses; total occlusion of a high marginal/ramus intermediate vessel with distal retrograde filling; dominant left circumflex vessel with 85% very proximal stenosis, total occlusion of marginal branches and distal AV groove circumflex occlusion with faint collateralization to the PDA/PLA-like vessel, and proximal to mid 50% stenoses in a small nondominant RCA.  Prior echocardiographic documentation of an ejection fraction of 25-30%.  LVEDP 16 mmHg.  RECOMMENDATION: There appears to be significant thrombus present in the circumflex territory.  Heparin will be resumed.  Surgical consultation will be obtained for consideration of CABG  revascularization.   Assessment:    1. Non-ST elevation myocardial infarction (NSTEMI), subsequent episode of care ([place])   2. Cardiomyopathy, ischemic   3. Paroxysmal atrial fibrillation ([facility])   4. Essential hypertension   5. Medication management   6. Uncontrolled type 2 diabetes mellitus without complication, without long-term current use of insulin ([place])      Plan:   In order of problems listed above:  1. Subsequent Episode of Care for NSTEMI/ CAD - admitted in 05/2016 for I&D of a left axillary abscess, developed NSTEMI the following day with EKG showing  new inferior Q-waves and lateral ST depression with a peak troponin of 16.06. Cardiac cath on 8/29 showed severe multivessel CAD as mentioned above and CABG was recommended but not thought to be a current CABG candidate with his active MRSA wound along his axilla.  - denies any recent anginal symptoms. Has not had to take SL NTG. Reports feeling fatigued on a daily basis. EKG today shows ST depression in the inferior and lateral leads. Has appointment with Dr. Prescott Gum next week to discuss potential surgical dates. - continue ASA, statin, and BB.   2. Ischemic Cardiomyopathy - echo in 05/2016 showed reduced EF of 25-35% and akinesis of the basal-midinferolateral and inferior myocardium. Consider repeat echocardiogram 6 months out following CABG to reassess EF.  - does not appear volume overloaded by physical exam. - continue BB and Lasix. Lisinopril recently discontinued secondary to hypotension, SBP in 90's at times. BP does not allow for addition of Spironolactone at this time as well. If BP remains low, could consider reducing Lopressor dosing to 82m BID and starting Lisinopril 2.517mdaily at next office visit. Hesitant to reduce BB dosing at this time as he reports palpitations which could be secondary to PAF or NSVT (had episodes of this during hospitalization).   3. Paroxysmal Atrial Fibrillation - occurred during recent  hospitalization following surgical I&D along with MI. Reports occasional palpitations which are less intense than PAF. Could be episodes of NSVT with his known cardiomyopathy. - This patients  CHA2DS2-VASc Score and unadjusted Ischemic Stroke Rate (% per year) is equal to 4.8 % stroke rate/year from a score of 4 (CHF, HTN, DM, Vascular). Continue Elquis for anticoagulation. - continue Lopressor 7m BID. EKG today shows NSR.   4. HTN - BP well-controlled at 114/70 during today's visit. Has been hypotensive at times with SBP in the 90's, therefore Lisinopril was discontinued. Would restart low-dose Lisinopril 2.5 mg daily if BP remains stable in setting of ischemic cardiomyopathy.   5. HLD - continue high-intensity statin in setting of 3-vessel CAD. AST 62, ALT 31 on 8/24. Repeat LFT's at next Cardiology follow-up.   6. Type 2 DM - A1c elevated to 12.2 during recent hospitalization. Glucose readings improved from 300's to 150's - 180's according to patient.  - being followed closely by PCP.  7. Hypokalemia - K+ 5.2 on 9/14. Dosing changed from 40 mEq daily to 40 mEq every other day. Will recheck BMET today with continued Lasix use.   Medication Adjustments/Labs and Tests Ordered: Current medicines are reviewed at length with the patient today.  Concerns regarding medicines are outlined above.  Medication changes, Labs and Tests ordered today are listed in the Patient Instructions below. Patient Instructions  Medication Instructions:  NO CHANGES.  Labwork: Your physician recommends that you return for lab work in: [place]   Follow-Up: Your physician recommends that you schedule a follow-up appointment in: 3 MONTHS WITH DR HILTY.  If you need a refill on your cardiac medications before your next appointment, please call your pharmacy.      Signed, BErma Heritage PUtah 07/06/2016 3:16 PM    [facility] [address]   [id number]: [phone] Fax: [phone] [address] [place]assen[place] [id number]: [phone]

## 2016-07-07 ENCOUNTER — Telehealth: Payer: Self-pay | Admitting: Internal Medicine

## 2016-07-07 LAB — BASIC METABOLIC PANEL
BUN: 28 mg/dL — AB (ref 7–25)
CALCIUM: 8.6 mg/dL (ref 8.6–10.3)
CHLORIDE: 103 mmol/L (ref 98–110)
CO2: 24 mmol/L (ref 20–31)
CREATININE: 1.03 mg/dL (ref 0.70–1.33)
Glucose, Bld: 245 mg/dL — ABNORMAL HIGH (ref 65–99)
Potassium: 4.6 mmol/L (ref 3.5–5.3)
Sodium: 140 mmol/L (ref 135–146)

## 2016-07-07 NOTE — Telephone Encounter (Signed)
Results and recommendations from yesterday's BMET discussed w patient, he voiced understanding and thanks.

## 2016-07-07 NOTE — Telephone Encounter (Signed)
New message ° ° ° ° ° ° °Pt returning nurse call  °

## 2016-07-07 NOTE — Telephone Encounter (Signed)
Received signed [name] Term Disability Statement back from [place], [place].  Form was faxed to [facility] and patient given his copy. lp

## 2016-07-09 ENCOUNTER — Ambulatory Visit: Payer: Managed Care, Other (non HMO) | Admitting: Cardiothoracic Surgery

## 2016-07-09 ENCOUNTER — Telehealth: Payer: Self-pay | Admitting: Adult Health

## 2016-07-09 NOTE — Telephone Encounter (Signed)
[facility] Day - Client [place] Call Center Patient Name: [name] DOB: [birth year] Initial Comment Caller states father has been very nauseous, upset stomach and vomiting, would like to know if something could be called in. Nurse Assessment Nurse: Martyn Ehrich RN, Felicia Date/Time (Eastern Time): 07/09/2016 3:40:54 PM Confirm and document reason for call. If symptomatic, describe symptoms. You must click the next button to save text entered. ---PT is vomiting with stomach upset. (Dtr put on speaker phone with pt on phone) Vomiting on and off for a week. Diarrhea for same time frame. No fever. Vomited 1x today and in last 24 h. Now blood sugar is 240. He checks it BID. BLood sugars have been less than 250 lately Has the patient traveled out of the country within the last 30 days? ---No Does the patient have any new or worsening symptoms? ---[name] a triage be completed? ---Yes Related visit to physician within the last 2 weeks? ---No Does the PT have any chronic conditions? (i.e. diabetes, asthma, etc.) ---Yes List chronic conditions. ---DM, He had an MI 3 wks ago on 8/23 Is this a behavioral health or substance abuse call? ---No Guidelines Guideline Title Affirmed Question Affirmed Notes Heart Attack Post-Hospitalization Followup Call [1] Discharged from hospital after treatment for heart attack AND [2] symptoms improved (feels BETTER) (all other triage questions negative) Vomiting High-risk adult (e.g., diabetes mellitus, brain tumor, V-P shunt, hernia) Final Disposition User Go to ED Now (or PCP triage) Martyn Ehrich, RN, FeliciaComments told him will page MD and would recommend that he go to ER in 30 min if he doesnt hear from the MD at the office because he is diabetic. Will call the triage nurse in office bc the pt's blood sugars have been good and he only vomited 1x today. 2 diarrheas - no blood in diarrhea. No fever. He  has been in the low 100's on blood sugar before he eats and higher after but not higher than 250 reached [facility] triage nurse in office and she will ask Pt's NP if Pt needs to go to ER with mild vomiting and diarrhea when blood sugars are good without other symptoms - reported MI 3 wks ago but triage was good for that f/u - no chest pain - Nafziger, NP said he should use imodium and if that doesnt help go to the ERComments told daughter Nafziger's advice and she voiced understanding Referrals GO TO FACILITY UNDECIDED Disagree/Comply: Comply Call Id: [id number]

## 2016-07-10 NOTE — Telephone Encounter (Signed)
Left message for patient to return phone call.  

## 2016-07-10 NOTE — Telephone Encounter (Signed)
Can we see how he is feeling

## 2016-07-10 NOTE — Telephone Encounter (Signed)
Patient states that he feels great today, and has had no episodes of N/V, or diarrhea.  He states that it happens almost every other day - he will feel great one day, and then the next he feels very nauseated, throws up and has episodes of diarrhea.  His states his blood sugars have been within normal range.

## 2016-07-10 NOTE — Telephone Encounter (Signed)
Will route to Cory as FYI. 

## 2016-07-13 ENCOUNTER — Telehealth: Payer: Self-pay | Admitting: Adult Health

## 2016-07-13 ENCOUNTER — Ambulatory Visit: Payer: BLUE CROSS/BLUE SHIELD | Admitting: Cardiothoracic Surgery

## 2016-07-13 NOTE — Telephone Encounter (Signed)
Ok to continue wound care

## 2016-07-13 NOTE — Telephone Encounter (Signed)
Patty with [facility] needs verbal to continue wound care for pt. Patty states he sent a fax last week but has not heard anything. Pt's would has not completely healed and she needs to continue to see.

## 2016-07-14 NOTE — Telephone Encounter (Signed)
Patty notified and verbalized understanding.

## 2016-07-15 ENCOUNTER — Telehealth: Payer: Self-pay | Admitting: Adult Health

## 2016-07-15 NOTE — Telephone Encounter (Signed)
See below

## 2016-07-15 NOTE — Telephone Encounter (Signed)
° ° °  Patti from [facility] call to say she saw pt today and the pt has a rash on both hands that extend up to his elbow. It is not itching but burning and not sure what it is coming from. She said she does not know what is causing this. Would like a call back   [phone]

## 2016-07-15 NOTE — Telephone Encounter (Signed)
He needs to be evaluated.   Can try putting hydocortisone cream on it until seen

## 2016-07-15 NOTE — Telephone Encounter (Signed)
Patti notified.

## 2016-07-17 ENCOUNTER — Ambulatory Visit (INDEPENDENT_AMBULATORY_CARE_PROVIDER_SITE_OTHER): Payer: BLUE CROSS/BLUE SHIELD | Admitting: Adult Health

## 2016-07-17 ENCOUNTER — Encounter: Payer: Self-pay | Admitting: Adult Health

## 2016-07-17 VITALS — BP 118/74 | Temp 98.2°F | Ht 66.0 in | Wt 156.7 lb

## 2016-07-17 DIAGNOSIS — R21 Rash and other nonspecific skin eruption: Secondary | ICD-10-CM | POA: Diagnosis not present

## 2016-07-17 DIAGNOSIS — I10 Essential (primary) hypertension: Secondary | ICD-10-CM

## 2016-07-17 DIAGNOSIS — L02412 Cutaneous abscess of left axilla: Secondary | ICD-10-CM | POA: Diagnosis not present

## 2016-07-17 DIAGNOSIS — E1165 Type 2 diabetes mellitus with hyperglycemia: Secondary | ICD-10-CM | POA: Diagnosis not present

## 2016-07-17 DIAGNOSIS — IMO0001 Reserved for inherently not codable concepts without codable children: Secondary | ICD-10-CM

## 2016-07-17 LAB — POCT GLYCOSYLATED HEMOGLOBIN (HGB A1C): Hemoglobin A1C: 8.9

## 2016-07-17 MED ORDER — TRIAMCINOLONE ACETONIDE 0.5 % EX OINT: 1.0000 "application " | TOPICAL_OINTMENT | Freq: Two times a day (BID) | CUTANEOUS | 0 refills | Status: DC

## 2016-07-17 NOTE — Progress Notes (Signed)
Subjective:    Patient ID: [name], male    DOB: 11-Aug-1961, 55 y.o.   MRN: [id number]  HPI  55 year old male who presents to the office today for follow-up A1c. His previous A1c was 12.2 he was not taking any medications and was not following a diabetic diet at this point in time. Is been released from hospital in August and is supposed and STEMI, respiratory failure, A. Fib, he has been working on diet and following a diabetic diet as closely as he can, he's also been taking his medications on a regular basis. Today his A1c has dropped to an 8.8  He is also complaining of a red, nonpainful, and non-itching rash that covers all fingers of bilateral hands. First noticed this rash approximately 2 weeks ago after he was [place] sun [place] the [place] at that time he was taking doxycycline. Since that time he has finished his course approximately 4 days ago. He continues to have this red rash on his hands 8 he feels is getting worse. He's been seen over-the-counter hydrocortisone cream and she reports seems to do nothing. He was seen at an urgent care for this matter and they again prescribed and over-the-counter hydrocortisone cream for possible eczema.  Additionally on evaluation today the wound in his left axilla was monitored. The smaller of the wounds have since closed up and a larger wound appears to be closing and healing very nicely. I removed the packing from the wound and there seems to be no infection. Advised family that they no longer need to pack this wound and will close naturally. No redness,warmth or purulent discharge noticed.  Overall, [name] feels as though he is continuing to improve. He is walking longer distances he feels as though his strength is coming back to him. He is slowly putting on weight   Review of Systems  Constitutional: Positive for fever.  Respiratory: Negative.   Cardiovascular: Negative.   Gastrointestinal: Negative.   Skin: Positive for rash and  wound.  Neurological: Positive for weakness. Negative for light-headedness.  Psychiatric/Behavioral: Negative.   All other systems reviewed and are negative.  Past Medical History:  Diagnosis Date  . Abrasion L FOREARM  . Atrial fibrillation ([place])    a. initially occurring in post-op setting in 05/2016, started on Eliquis  . CAD (coronary artery disease)    a. 05/2016: NSTEMI, cath showing 3v disease with CABG recommended once MRSA infection resolved.  . Diabetes mellitus without complication ([place])   . Headache(784.0)   . Hematuria, microscopic "SINCE I WAS A KID"  . Hyperlipidemia   . Hypertension   . Ischemic cardiomyopathy    a. 05/2016: echo w/ EF of 25-35% and akinesis of the basal-midinferolateral and inferior myocardium.  . Lactose intolerance   . Respiratory failure ([place])   . Rotator cuff tear RIGHT  . Tobacco use    a. quit in 05/2016 following MI    Social History   Social History  . Marital status: Married    Spouse name: N/A  . Number of children: N/A  . Years of education: N/A   Occupational History  . Not on file.   Social History Main Topics  . Smoking status: Current Every Day Smoker    Packs/day: 1.50  . Smokeless tobacco: Never Used  . Alcohol use No  . Drug use: No  . Sexual activity: Yes   Other Topics Concern  . Not on file   Social History Narrative  .  No narrative on file    Past Surgical History:  Procedure Laterality Date  . CARDIAC CATHETERIZATION N/A 06/16/2016   Procedure: Left Heart Cath and Coronary Angiography;  Surgeon: Troy Sine, MD;  Location: [facility];  Service: Cardiovascular;  Laterality: N/A;  . HERNIA REPAIR  X2  . INCISION AND DRAINAGE ABSCESS Left 06/10/2016   Procedure: INCISION AND DRAINAGE LEFT AXILLARY ABSCESS;  Surgeon: Autumn Messing III, MD;  Location: [facility];  Service: General;  Laterality: Left;  . ROTATOR CUFF REPAIR Bilateral     Family History  Problem Relation Age of Onset  . Hypertension  Father   . Sudden death Father   . Diabetes Father   . Hypertension Mother   . Heart attack Sister     Allergies  Allergen Reactions  . Latex Rash    Current Outpatient Prescriptions on File Prior to Visit  Medication Sig Dispense Refill  . apixaban (ELIQUIS) 5 MG TABS tablet Take 1 tablet (5 mg total) by mouth 2 (two) times daily. 60 tablet 4  . aspirin 81 MG chewable tablet Chew 1 tablet (81 mg total) by mouth daily. 30 tablet 3  . atorvastatin (LIPITOR) 80 MG tablet Take 1 tablet (80 mg total) by mouth at bedtime. 30 tablet 3  . blood glucose meter kit and supplies KIT Dispense based on patient and insurance preference. Use up to four times daily as directed. (FOR ICD-9 250.00, 250.01). 1 each 0  . buPROPion (WELLBUTRIN XL) 150 MG 24 hr tablet Take 1 tablet (150 mg total) by mouth daily. 30 tablet 1  . doxycycline (VIBRAMYCIN) 100 MG capsule Take 1 capsule (100 mg total) by mouth 2 (two) times daily. 10 capsule 0  . furosemide (LASIX) 40 MG tablet Take 40 mg by mouth daily.    Marland Kitchen glipiZIDE (GLUCOTROL) 10 MG tablet Take 1 tablet (10 mg total) by mouth 2 (two) times daily before a meal. 60 tablet 3  . guaiFENesin-dextromethorphan (ROBITUSSIN DM) 100-10 MG/5ML syrup Take 5 mLs by mouth every 4 (four) hours as needed for cough. 118 mL 0  . KLOR-CON M20 20 MEQ tablet Take 40 mEq by mouth every other day.  3  . metFORMIN (GLUCOPHAGE) 1000 MG tablet Take 1 tablet (1,000 mg total) by mouth 2 (two) times daily with a meal. 60 tablet 3  . metoprolol (LOPRESSOR) 50 MG tablet Take 1 tablet (50 mg total) by mouth 2 (two) times daily. 60 tablet 3  . nicotine (NICODERM CQ - DOSED IN MG/24 HR) 7 mg/24hr patch Place 1 patch (7 mg total) onto the skin daily. 28 patch 0  . nitroGLYCERIN (NITROSTAT) 0.4 MG SL tablet Place 1 tablet (0.4 mg total) under the tongue every 5 (five) minutes as needed for chest pain. 30 tablet 12  . ONE TOUCH ULTRA TEST test strip USE UP TO 4 TIMES DAILY AS DIRECTED  2  .  ONETOUCH DELICA LANCETS 35W MISC USE UP TO 4 TIMES DAILY AS DIRECTED  2   No current facility-administered medications on file prior to visit.     BP 118/74   Temp 98.2 F (36.8 C) (Oral)   Ht '5\' 6"'  (1.676 m)   Wt 156 lb 11.2 oz (71.1 kg)   BMI 25.29 kg/m       Objective:   Physical Exam  Constitutional: He is oriented to person, place, and time. He appears well-developed and well-nourished.  HENT:  Head: Normocephalic and atraumatic.  Right Ear: External ear normal.  Left Ear: External ear normal.  Nose: Nose normal.  Mouth/Throat: Oropharynx is clear and moist. No oropharyngeal exudate.  Eyes: Conjunctivae and EOM are normal. Pupils are equal, round, and reactive to light. Right eye exhibits no discharge. Left eye exhibits no discharge. No scleral icterus.  Cardiovascular: Normal rate, regular rhythm, normal heart sounds and intact distal pulses.  Exam reveals no gallop and no friction rub.   No murmur heard. Pulmonary/Chest: Effort normal and breath sounds normal. No respiratory distress. He has no wheezes. He has no rales. He exhibits no tenderness.  Musculoskeletal: Normal range of motion. He exhibits no edema, tenderness or deformity.  Neurological: He is alert and oriented to person, place, and time. He has normal reflexes. He displays normal reflexes. No cranial nerve deficit. Coordination normal.  Skin: Skin is warm and dry. No rash noted. No erythema. No pallor.  Well-healing wound in left axilla. Removed the dressing and packing recovered with Steri-Strips and Band-Aid  Psychiatric: He has a normal mood and affect. His behavior is normal. Judgment and thought content normal.  Nursing note and vitals reviewed.      Assessment & Plan:  1. Uncontrolled type 2 diabetes mellitus without complication, without long-term current use of insulin ([place]) - He's done great since being discharged from the hospital. We'll continue him on the same regimen of oral anti-glycemic  agents. No need for insulin at this time. Tinea with diabetic diet and exercise as tolerated - POC HgB A1c- 8.8 - Follow-up in 3 months for her next A1c check - Continue  to monitor blood sugars at home, return precautions given  2. Essential hypertension - Well-controlled  3. Abscess of axilla, left - Well-healing - I will continue to have family monitor - report back to the office with any signs of infection  4. Rash and nonspecific skin eruption - Appears as eczema - triamcinolone ointment (KENALOG) 0.5 %; Apply 1 application topically 2 (two) times daily.  Dispense: 30 g; Refill: 0 - Follow-up if no improvement within one week  [name], NP

## 2016-07-17 NOTE — Progress Notes (Signed)
   Subjective:    Patient ID: [name], male    DOB: Nov 17, 1960, 55 y.o.   MRN: [id number]  HPI   BP Readings from Last 3 Encounters:  07/17/16 118/74  07/06/16 114/70  07/02/16 112/74      Lab Results  Component Value Date   HGBA1C 12.2 (H) 06/10/2016    Review of Systems     Objective:   Physical Exam        Assessment & Plan:

## 2016-07-22 ENCOUNTER — Ambulatory Visit: Payer: BLUE CROSS/BLUE SHIELD | Admitting: Cardiothoracic Surgery

## 2016-07-24 ENCOUNTER — Ambulatory Visit (INDEPENDENT_AMBULATORY_CARE_PROVIDER_SITE_OTHER): Payer: BLUE CROSS/BLUE SHIELD | Admitting: Cardiothoracic Surgery

## 2016-07-24 ENCOUNTER — Encounter: Payer: Self-pay | Admitting: Cardiothoracic Surgery

## 2016-07-24 VITALS — BP 108/73 | HR 92 | Resp 20 | Ht 66.0 in | Wt 156.0 lb

## 2016-07-24 DIAGNOSIS — I2510 Atherosclerotic heart disease of native coronary artery without angina pectoris: Secondary | ICD-10-CM

## 2016-07-24 NOTE — Progress Notes (Signed)
PCP is [name], NP Referring Provider is Pixie Casino, MD  Chief Complaint  Patient presents with  . Coronary Artery Disease    F/u from hosptial consult, further discuss surgery    HPI:The patient returns for follow-up and discussion of recently diagnosed severe three-vessel coronary artery disease and a diabetic pattern with ejection fraction of 35%. The patient was admitted with a MRSA infection-abscess of the left axilla 6 weeks ago. He had incision and drainage by general surgery and was placed on antibiotics. He had a postop NSTEMI and remained hemodynamically stable. He underwent cardiac catheterization that showed severe three-vessel coronary disease with moderate LV dysfunction. Echo shows EF 25 - 30% with mild MR.  Patient's MRSA infection in the left axilla has significantly improved but not completely healed. The patient states he has had previous MRSA infections at surgical sites for shoulder surgery. The patient's hemoglobin A1c on admission 6 weeks ago was 12.2. After being treated with proper hypoglycemic therapy his A1c is now 8.2. Patient presents for evaluation for CABG and to determine timing. He denies any angina. He is starting to gain weight after his last hospitalization. He is increasing his strength is walking daily. He also has stopped smoking completely. The patient takes Eliquis for recent diagnosis of atrial fibrillation   Assessment of the patient's coronary angiogram demonstrates the LAD is an adequate target, the ramus intermediate is an adequate target and the cervix marginal is a very marginal target due to diffuse diabetic disease. The right coronary is nondominant, small and not graftable.  Past Medical History:  Diagnosis Date  . Abrasion L FOREARM  . Atrial fibrillation ([place])    a. initially occurring in post-op setting in 05/2016, started on Eliquis  . CAD (coronary artery disease)    a. 05/2016: NSTEMI, cath showing 3v disease with CABG  recommended once MRSA infection resolved.  . Diabetes mellitus without complication ([place])   . Headache(784.0)   . Hematuria, microscopic "SINCE I WAS A KID"  . Hyperlipidemia   . Hypertension   . Ischemic cardiomyopathy    a. 05/2016: echo w/ EF of 25-35% and akinesis of the basal-midinferolateral and inferior myocardium.  . Lactose intolerance   . Respiratory failure ([place])   . Rotator cuff tear RIGHT  . Tobacco use    a. quit in 05/2016 following MI    Past Surgical History:  Procedure Laterality Date  . CARDIAC CATHETERIZATION N/A 06/16/2016   Procedure: Left Heart Cath and Coronary Angiography;  Surgeon: Troy Sine, MD;  Location: [facility];  Service: Cardiovascular;  Laterality: N/A;  . HERNIA REPAIR  X2  . INCISION AND DRAINAGE ABSCESS Left 06/10/2016   Procedure: INCISION AND DRAINAGE LEFT AXILLARY ABSCESS;  Surgeon: Autumn Messing III, MD;  Location: [facility];  Service: General;  Laterality: Left;  . ROTATOR CUFF REPAIR Bilateral     Family History  Problem Relation Age of Onset  . Hypertension Father   . Sudden death Father   . Diabetes Father   . Hypertension Mother   . Heart attack Sister     Social History Social History  Substance Use Topics  . Smoking status: Current Every Day Smoker    Packs/day: 1.50  . Smokeless tobacco: Never Used  . Alcohol use No    Current Outpatient Prescriptions  Medication Sig Dispense Refill  . apixaban (ELIQUIS) 5 MG TABS tablet Take 1 tablet (5 mg total) by mouth 2 (two) times daily. 60 tablet 4  .  aspirin 81 MG chewable tablet Chew 1 tablet (81 mg total) by mouth daily. 30 tablet 3  . atorvastatin (LIPITOR) 80 MG tablet Take 1 tablet (80 mg total) by mouth at bedtime. 30 tablet 3  . blood glucose meter kit and supplies KIT Dispense based on patient and insurance preference. Use up to four times daily as directed. (FOR ICD-9 250.00, 250.01). 1 each 0  . buPROPion (WELLBUTRIN XL) 150 MG 24 hr tablet Take 1 tablet (150 mg  total) by mouth daily. 30 tablet 1  . doxycycline (VIBRAMYCIN) 100 MG capsule Take 1 capsule (100 mg total) by mouth 2 (two) times daily. 10 capsule 0  . furosemide (LASIX) 40 MG tablet Take 40 mg by mouth daily.    Marland Kitchen glipiZIDE (GLUCOTROL) 10 MG tablet Take 1 tablet (10 mg total) by mouth 2 (two) times daily before a meal. 60 tablet 3  . guaiFENesin-dextromethorphan (ROBITUSSIN DM) 100-10 MG/5ML syrup Take 5 mLs by mouth every 4 (four) hours as needed for cough. 118 mL 0  . KLOR-CON M20 20 MEQ tablet Take 40 mEq by mouth every other day.  3  . metFORMIN (GLUCOPHAGE) 1000 MG tablet Take 1 tablet (1,000 mg total) by mouth 2 (two) times daily with a meal. 60 tablet 3  . metoprolol (LOPRESSOR) 50 MG tablet Take 1 tablet (50 mg total) by mouth 2 (two) times daily. 60 tablet 3  . nicotine (NICODERM CQ - DOSED IN MG/24 HR) 7 mg/24hr patch Place 1 patch (7 mg total) onto the skin daily. 28 patch 0  . nitroGLYCERIN (NITROSTAT) 0.4 MG SL tablet Place 1 tablet (0.4 mg total) under the tongue every 5 (five) minutes as needed for chest pain. 30 tablet 12  . ONE TOUCH ULTRA TEST test strip USE UP TO 4 TIMES DAILY AS DIRECTED  2  . ONETOUCH DELICA LANCETS 08Q MISC USE UP TO 4 TIMES DAILY AS DIRECTED  2  . triamcinolone ointment (KENALOG) 0.5 % Apply 1 application topically 2 (two) times daily. 30 g 0   No current facility-administered medications for this visit.     Allergies  Allergen Reactions  . Latex Rash    Review of Systems        Review of Systems :  [ y ] = yes, [  ] = no        General :  Weight gain [ yes  ]    Weight loss  [   ]  Fatigue [  ]  Fever [  ]  Chills  [  ]                                Weakness  [  ]           HEENT    Headache [  ]  Dizziness [  ]  Blurred vision [  ] Glaucoma  [  ]                          Nosebleeds [  ] Painful or loose teeth [  ]        Cardiac :  Chest pain/ pressure [  ]  Resting SOB [  ] exertional SOB [yes mild  ]                        Orthopnea [   ]  Pedal edema  [  ]  Palpitations [  ] Syncope/presyncope [ ]                        Paroxysmal nocturnal dyspnea [  ]         Pulmonary : cough [  ]  wheezing [  ]  Hemoptysis [  ] Sputum [  ] Snoring [  ]                              Pneumothorax [  ]  Sleep apnea [  ]        GI : Vomiting [  ]  Dysphagia [  ]  Melena  [  ]  Abdominal pain [  ] BRBPR [  ]              Heart burn [  ]  Constipation [  ] Diarrhea  [  ] Colonoscopy [   ]        GU : Hematuria [  ]  Dysuria [  ]  Nocturia [  ] UTI's [  ]        Vascular : Claudication [  ]  Rest pain [  ]  DVT [  ] Vein stripping [  ] leg ulcers [  ]                          TIA [  ] Stroke [  ]  Varicose veins [  ]        NEURO :  Headaches  [  ] Seizures [  ] Vision changes [  ] Paresthesias [  ]                                       Seizures [  ]        Musculoskeletal :  Arthritis [  ] Gout  [  ]  Back pain [  ]  Joint pain [  ]        Skin :  Rash [  ]  Melanoma [  ] Sores [yes under left axilla  ]        Heme : Bleeding problems [  ]Clotting Disorders [  ] Anemia [  ]Blood Transfusion [ ]        Endocrine : Diabetes [  ] Heat or Cold intolerance [  ] Polyuria [  ]excessive thirst [ ]        Psych : Depression [  ]  Anxiety [  ]  Psych hospitalizations [  ] Memory change [  ]                                               BP 108/73 (BP Location: Right Arm, Patient Position: Sitting, Cuff Size: Normal)   Pulse 92   Resp 20   Ht 5' 6" (1.676 m)   Wt 156 lb (70.8 kg)   SpO2 98% Comment: RA  BMI 25.18 kg/m  Physical Exam      Physical Exam  General: Middle-aged small Caucasian male no acute distress HEENT: Normocephalic  pupils equal , dentition adequate Neck: Supple without JVD, adenopathy, or bruit Chest: Clear to auscultation, symmetrical breath sounds, no rhonchi, no tenderness             or deformity Cardiovascular: Regular rate and rhythm, no murmur, no gallop, peripheral pulses             palpable in all  extremities Abdomen:  Soft, nontender, no palpable mass or organomegaly Extremities: Warm, well-perfused, no clubbing cyanosis edema or tenderness,              no venous stasis changes of the legs Rectal/GU: Deferred Neuro: Grossly non--focal and symmetrical throughout Skin: Clean and dry , small skin ulcer in left axilla with clean granulation tissue but approximately 0.7 cm deep   Diagnostic Tests: No chest x-ray today  Impression: Severe two-vessel coronary disease with moderate-severe LV dysfunction Patient would benefit from multivessel CABG when his MRSA infection of his left chest-axilla heels. He'll continue local care and return for follow-up visit in 3 weeks at which time we will probably discuss scheduling surgery and stopping the Eliqui.s  Plan:   Len Childs, MD [facility] [phone]

## 2016-08-12 ENCOUNTER — Ambulatory Visit (INDEPENDENT_AMBULATORY_CARE_PROVIDER_SITE_OTHER): Payer: BLUE CROSS/BLUE SHIELD | Admitting: Cardiothoracic Surgery

## 2016-08-12 ENCOUNTER — Encounter: Payer: Self-pay | Admitting: Cardiothoracic Surgery

## 2016-08-12 VITALS — BP 118/84 | HR 92 | Resp 20 | Ht 66.0 in | Wt 156.0 lb

## 2016-08-12 DIAGNOSIS — I2510 Atherosclerotic heart disease of native coronary artery without angina pectoris: Secondary | ICD-10-CM

## 2016-08-12 NOTE — Progress Notes (Signed)
PCP is Derrick Peng, NP Referring Provider is Derrick Peng, NP  Chief Complaint  Patient presents with  . Coronary Artery Disease    3 week f/u, discuss scheduling surgery date    HPI:The patient returns for further discussion of his documented severe multivessel coronary disease with significant LV dysfunction EF 25-30 percent. The patient was an uncontrolled diabetic and presented in August with a left axillary abscess with MRSA and underwent drainage by general surgery under anesthesia. The patient had a non-ST elevation MI after surgery. He underwent cardiac catheterization. He had occlusion of the circumflex, small nondominant RCA and high-grade stenosis of the LAD. LVEDP 16. Echo shows EF 20% without significant MR. The patient had a badly infected left axilla and was discharged. He is now completed extended antibiotics with doxycycline and wound care and the axilla is now healed. The patient denies symptoms of angina or shortness of breath. The patient's diabetes is now under much better control and he is compliant with his medications. His hemoglobin A1c has decreased dramatically. The patient has stopped smoking.  The patient was diagnosed with atrial fibrillation during his admission and was placed on Eliquis. No bleeding complications noted.   Past Medical History:  Diagnosis Date  . Abrasion L FOREARM  . Atrial fibrillation ([place])    a. initially occurring in post-op setting in 05/2016, started on Eliquis  . CAD (coronary artery disease)    a. 05/2016: NSTEMI, cath showing 3v disease with CABG recommended once MRSA infection resolved.  . Diabetes mellitus without complication ([place])   . Headache(784.0)   . Hematuria, microscopic "SINCE I WAS A KID"  . Hyperlipidemia   . Hypertension   . Ischemic cardiomyopathy    a. 05/2016: echo w/ EF of 25-35% and akinesis of the basal-midinferolateral and inferior myocardium.  . Lactose intolerance   . Respiratory failure ([place])   .  Rotator cuff tear RIGHT  . Tobacco use    a. quit in 05/2016 following MI    Past Surgical History:  Procedure Laterality Date  . CARDIAC CATHETERIZATION N/A 06/16/2016   Procedure: Left Heart Cath and Coronary Angiography;  Surgeon: Troy Sine, MD;  Location: [facility];  Service: Cardiovascular;  Laterality: N/A;  . HERNIA REPAIR  X2  . INCISION AND DRAINAGE ABSCESS Left 06/10/2016   Procedure: INCISION AND DRAINAGE LEFT AXILLARY ABSCESS;  Surgeon: Autumn Messing III, MD;  Location: [facility];  Service: General;  Laterality: Left;  . ROTATOR CUFF REPAIR Bilateral     Family History  Problem Relation Age of Onset  . Hypertension Father   . Sudden death Father   . Diabetes Father   . Hypertension Mother   . Heart attack Sister     Social History Social History  Substance Use Topics  . Smoking status: Current Every Day Smoker    Packs/day: 1.50  . Smokeless tobacco: Never Used  . Alcohol use No    Current Outpatient Prescriptions  Medication Sig Dispense Refill  . apixaban (ELIQUIS) 5 MG TABS tablet Take 1 tablet (5 mg total) by mouth 2 (two) times daily. 60 tablet 4  . aspirin 81 MG chewable tablet Chew 1 tablet (81 mg total) by mouth daily. 30 tablet 3  . atorvastatin (LIPITOR) 80 MG tablet Take 1 tablet (80 mg total) by mouth at bedtime. 30 tablet 3  . blood glucose meter kit and supplies KIT Dispense based on patient and insurance preference. Use up to four times daily as directed. (FOR  ICD-9 250.00, 250.01). 1 each 0  . buPROPion (WELLBUTRIN XL) 150 MG 24 hr tablet Take 1 tablet (150 mg total) by mouth daily. 30 tablet 1  . doxycycline (VIBRAMYCIN) 100 MG capsule Take 1 capsule (100 mg total) by mouth 2 (two) times daily. 10 capsule 0  . furosemide (LASIX) 40 MG tablet Take 40 mg by mouth daily.    Marland Kitchen glipiZIDE (GLUCOTROL) 10 MG tablet Take 1 tablet (10 mg total) by mouth 2 (two) times daily before a meal. 60 tablet 3  . guaiFENesin-dextromethorphan (ROBITUSSIN DM)  100-10 MG/5ML syrup Take 5 mLs by mouth every 4 (four) hours as needed for cough. 118 mL 0  . KLOR-CON M20 20 MEQ tablet Take 40 mEq by mouth every other day.  3  . metFORMIN (GLUCOPHAGE) 1000 MG tablet Take 1 tablet (1,000 mg total) by mouth 2 (two) times daily with a meal. 60 tablet 3  . metoprolol (LOPRESSOR) 50 MG tablet Take 1 tablet (50 mg total) by mouth 2 (two) times daily. 60 tablet 3  . nicotine (NICODERM CQ - DOSED IN MG/24 HR) 7 mg/24hr patch Place 1 patch (7 mg total) onto the skin daily. 28 patch 0  . nitroGLYCERIN (NITROSTAT) 0.4 MG SL tablet Place 1 tablet (0.4 mg total) under the tongue every 5 (five) minutes as needed for chest pain. 30 tablet 12  . ONE TOUCH ULTRA TEST test strip USE UP TO 4 TIMES DAILY AS DIRECTED  2  . ONETOUCH DELICA LANCETS 43X MISC USE UP TO 4 TIMES DAILY AS DIRECTED  2  . triamcinolone ointment (KENALOG) 0.5 % Apply 1 application topically 2 (two) times daily. 30 g 0   No current facility-administered medications for this visit.     Allergies  Allergen Reactions  . Latex Rash    Review of Systems        Review of Systems :  [ y ] = yes, [  ] = no        General :  Weight gain [   ]    Weight loss  [   ]  Fatigue [  ]  Fever [  ]  Chills  [  ]                                Weakness  [  ]           HEENT    Headache [  ]  Dizziness [  ]  Blurred vision [  ] Glaucoma  [  ]                          Nosebleeds [  ] Painful or loose teeth [  ]        Cardiac :  Chest pain/ pressure [  ]  Resting SOB [  ] exertional SOB [  ]                        Orthopnea [  ]  Pedal edema  [  ]  Palpitations [  ] Syncope/presyncope _0                         Paroxysmal nocturnal dyspnea [  ]         Pulmonary : cough [  ]  wheezing [  ]  Hemoptysis [  ] Sputum [  ] Snoring [  ]                              Pneumothorax [  ]  Sleep apnea [  ]        GI : Vomiting [  ]  Dysphagia [  ]  Melena  [  ]  Abdominal pain [  ] BRBPR [  ]              Heart burn [  ]   Constipation [  ] Diarrhea  [  ] Colonoscopy [   ]        GU : Hematuria [  ]  Dysuria [  ]  Nocturia [  ] UTI's [  ]        Vascular : Claudication [  ]  Rest pain [  ]  DVT [  ] Vein stripping [  ] leg ulcers [  ]                          TIA [  ] Stroke [  ]  Varicose veins [  ]        NEURO :  Headaches  [  ] Seizures [  ] Vision changes [  ] Paresthesias [  ]                                       Seizures [  ]      Significant medical problems with tremors of his left hand uncontrollable now           Musculoskeletal :  Arthritis [  ] Gout  [  ]  Back pain [  ]  Joint pain [  ]        Skin :  Rash [  ]  Melanoma [  ] Sores [  ]        Heme : Bleeding problems [  ]Clotting Disorders [  ] Anemia [  ]Blood Transfusion _0         Endocrine : Diabetes Blue.[name]  ] Heat or Cold intolerance [  ] Polyuria [  ]excessive thirst _1         Psych : Depression [  ]  Anxiety Blue.[name] ]  Psych hospitalizations [  ] Memory change [  ]                                               BP 118/84 (BP Location: Right Arm, Patient Position: Sitting, Cuff Size: Normal)   Pulse 92   Resp 20   Ht _2  (1.676 m)   Wt 156 lb (70.8 kg)   SpO2 98% Comment: RA  BMI 25.18 kg/m  Physical Exam      Physical Exam  General: Middle-aged Caucasian male accompanied by his family no acute distress HEENT: Normocephalic pupils equal , dentition adequate Neck: Supple without JVD, adenopathy, or bruit Chest: Clear to auscultation, symmetrical breath sounds, no rhonchi, no tenderness             or deformity. Incision under left axilla completely healed without  erythema Cardiovascular: Irregular rhythm consistent with atrial fibrillation, no murmur, no gallop, peripheral pulses             palpable in all extremities Abdomen:  Soft, nontender, no palpable mass or organomegaly Extremities: Warm, well-perfused, no clubbing cyanosis edema or tenderness,              no venous stasis changes of the legs Rectal/GU:  Deferred Neuro: Grossly non--focal and symmetrical throughout Skin: Clean and dry without rash or ulceration   Diagnostic Tests:  coronary angiograms again reviewed and discussed with patient and family   he would benefit from left IMA to LAD and vein grafts to ramus intermediate and circumflex marginal.  Impression:  history recent non-ST elevation MI, moderate to severe LV dysfunction and significant three-vessel CAD   Plan:Plan CABG 3 at [facility] November 6.   benefits, risks, alternatives discussed with patient and family and they understand and agree.   Eliquis will be stopped 6 days prior to surgery and the patient will continue his aspirin  Len Childs, MD [facility] [phone]

## 2016-08-13 ENCOUNTER — Other Ambulatory Visit: Payer: Self-pay | Admitting: *Deleted

## 2016-08-13 DIAGNOSIS — R251 Tremor, unspecified: Secondary | ICD-10-CM

## 2016-08-14 ENCOUNTER — Other Ambulatory Visit: Payer: Self-pay | Admitting: *Deleted

## 2016-08-14 DIAGNOSIS — I2510 Atherosclerotic heart disease of native coronary artery without angina pectoris: Secondary | ICD-10-CM

## 2016-08-17 ENCOUNTER — Ambulatory Visit
Admission: RE | Admit: 2016-08-17 | Discharge: 2016-08-17 | Disposition: A | Discharge status: Home / Self Care | Payer: BLUE CROSS/BLUE SHIELD | Source: Ambulatory Visit | Attending: Cardiothoracic Surgery | Admitting: Cardiothoracic Surgery

## 2016-08-17 DIAGNOSIS — R251 Tremor, unspecified: Secondary | ICD-10-CM

## 2016-08-17 MED ORDER — IOPAMIDOL (ISOVUE-300) INJECTION 61%
75.0000 mL | Freq: Once | INTRAVENOUS | Status: AC | PRN
  Administered 2016-08-17: 75 mL via INTRAVENOUS

## 2016-08-19 NOTE — Pre-Procedure Instructions (Signed)
NA TROWER  08/19/2016      [id number] [place], [place] - [address] [place] [address] Phone: [phone] Fax: [phone]    Your procedure is scheduled on Monday November 6  Report to [facility] Admitting at [place].M.  Call this number if you have problems the morning of surgery:  [phone]   Remember:  Do not eat food or drink liquids after midnight.   Take these medicines the morning of surgery with A SIP OF WATER acetaminophen (TYLENOL), buPROPion (WELLBUTRIN XL),  metoprolol (LOPRESSOR)  7 days prior to surgery STOP taking any Aspirin, Aleve, Naproxen, Ibuprofen, Motrin, Advil, Goody's, BC's, all herbal medications, fish oil, and all vitamins  WHAT DO I DO ABOUT MY DIABETES MEDICATION?   Marland Kitchen Do not take oral diabetes medicines (pills) the morning of surgery.metFORMIN (GLUCOPHAGE) and glipiZIDE (GLUCOTROL)  Only take morning dose of glipiZIDE (GLUCOTROL)    How to Manage Your Diabetes Before and After Surgery  Why is it important to control my blood sugar before and after surgery? . Improving blood sugar levels before and after surgery helps healing and can limit problems. . A way of improving blood sugar control is eating a healthy diet by: o  Eating less sugar and carbohydrates o  Increasing activity/exercise o  Talking with your doctor about reaching your blood sugar goals . High blood sugars (greater than 180 mg/dL) can raise your risk of infections and slow your recovery, so you will need to focus on controlling your diabetes during the weeks before surgery. . Make sure that the doctor who takes care of your diabetes knows about your planned surgery including the date and location.  How do I manage my blood sugar before surgery? . Check your blood sugar at least 4 times a day, starting 2 days before surgery, to make sure that the level is not too high or  low. o Check your blood sugar the morning of your surgery when you wake up and every 2 hours until you get to the Short Stay unit. . If your blood sugar is less than 70 mg/dL, you will need to treat for low blood sugar: o Do not take insulin. o Treat a low blood sugar (less than 70 mg/dL) with  cup of clear juice (cranberry or apple), 4 glucose tablets, OR glucose gel. o Recheck blood sugar in 15 minutes after treatment (to make sure it is greater than 70 mg/dL). If your blood sugar is not greater than 70 mg/dL on recheck, call [phone] for further instructions. . Report your blood sugar to the short stay nurse when you get to Short Stay.  . If you are admitted to the hospital after surgery: o Your blood sugar will be checked by the staff and you will probably be given insulin after surgery (instead of oral diabetes medicines) to make sure you have good blood sugar levels. o The goal for blood sugar control after surgery is 80-180 mg/dL.     Do not wear jewelry.  Do not wear lotions, powders, or perfumes, or deoderant.  Men may shave face and neck.  Do not bring valuables to the hospital.  [facility] is not responsible for any belongings or valuables.  Contacts, dentures or bridgework may not be worn into surgery.  Leave your suitcase in the car.  After surgery it may be brought to your room.  For  patients admitted to the hospital, discharge time will be determined by your treatment team.  Patients discharged the day of surgery will not be allowed to drive home.    Special instructions:   - Preparing For Surgery  Before surgery, you can play an important role. Because skin is not sterile, your skin needs to be as free of germs as possible. You can reduce the number of germs on your skin by washing with CHG (chlorahexidine gluconate) Soap before surgery.  CHG is an antiseptic cleaner which kills germs and bonds with the skin to continue killing germs even after  washing.  Please do not use if you have an allergy to CHG or antibacterial soaps. If your skin becomes reddened/irritated stop using the CHG.  Do not shave (including legs and underarms) for at least 48 hours prior to first CHG shower. It is OK to shave your face.  Please follow these instructions carefully.   1. Shower the NIGHT BEFORE SURGERY and the MORNING OF SURGERY with CHG.   2. If you chose to wash your hair, wash your hair first as usual with your normal shampoo.  3. After you shampoo, rinse your hair and body thoroughly to remove the shampoo.  4. Use CHG as you would any other liquid soap. You can apply CHG directly to the skin and wash gently with a scrungie or a clean washcloth.   5. Apply the CHG Soap to your body ONLY FROM THE NECK DOWN.  Do not use on open wounds or open sores. Avoid contact with your eyes, ears, mouth and genitals (private parts). Wash genitals (private parts) with your normal soap.  6. Wash thoroughly, paying special attention to the area where your surgery will be performed.  7. Thoroughly rinse your body with warm water from the neck down.  8. DO NOT shower/wash with your normal soap after using and rinsing off the CHG Soap.  9. Pat yourself dry with a CLEAN TOWEL.   10. Wear CLEAN PAJAMAS   11. Place CLEAN SHEETS on your bed the night of your first shower and DO NOT SLEEP WITH PETS.    Day of Surgery: Do not apply any deodorants/lotions. Please wear clean clothes to the hospital/surgery center.      Please read over the following fact sheets that you were given.

## 2016-08-20 ENCOUNTER — Encounter (HOSPITAL_COMMUNITY)
Admission: RE | Admit: 2016-08-20 | Discharge: 2016-08-20 | Disposition: A | Discharge status: Home / Self Care | Payer: BLUE CROSS/BLUE SHIELD | Source: Ambulatory Visit | Attending: Cardiothoracic Surgery | Admitting: Cardiothoracic Surgery

## 2016-08-20 ENCOUNTER — Encounter (HOSPITAL_COMMUNITY): Payer: Self-pay

## 2016-08-20 ENCOUNTER — Telehealth: Payer: Self-pay | Admitting: Adult Health

## 2016-08-20 ENCOUNTER — Other Ambulatory Visit: Payer: Self-pay

## 2016-08-20 DIAGNOSIS — Z01812 Encounter for preprocedural laboratory examination: Secondary | ICD-10-CM | POA: Insufficient documentation

## 2016-08-20 DIAGNOSIS — R9431 Abnormal electrocardiogram [ECG] [EKG]: Secondary | ICD-10-CM | POA: Insufficient documentation

## 2016-08-20 DIAGNOSIS — I2510 Atherosclerotic heart disease of native coronary artery without angina pectoris: Secondary | ICD-10-CM | POA: Insufficient documentation

## 2016-08-20 DIAGNOSIS — Z01818 Encounter for other preprocedural examination: Secondary | ICD-10-CM | POA: Diagnosis present

## 2016-08-20 DIAGNOSIS — Z01810 Encounter for preprocedural cardiovascular examination: Secondary | ICD-10-CM | POA: Insufficient documentation

## 2016-08-20 HISTORY — DX: Acute myocardial infarction, unspecified: I21.9

## 2016-08-20 HISTORY — DX: Malignant (primary) neoplasm, unspecified: C80.1

## 2016-08-20 LAB — COMPREHENSIVE METABOLIC PANEL
ALT: 26 U/L (ref 17–63)
AST: 27 U/L (ref 15–41)
Albumin: 3.8 g/dL (ref 3.5–5.0)
Alkaline Phosphatase: 101 U/L (ref 38–126)
Anion gap: 11 (ref 5–15)
BUN: 17 mg/dL (ref 6–20)
CO2: 26 mmol/L (ref 22–32)
Calcium: 8.5 mg/dL — ABNORMAL LOW (ref 8.9–10.3)
Chloride: 104 mmol/L (ref 101–111)
Creatinine, Ser: 1.09 mg/dL (ref 0.61–1.24)
GFR calc Af Amer: >60 mL/min (ref >60)
GFR calc non Af Amer: >60 mL/min (ref >60)
Glucose, Bld: 233 mg/dL — ABNORMAL HIGH (ref 65–99)
Potassium: 3.8 mmol/L (ref 3.5–5.1)
Sodium: 141 mmol/L (ref 135–145)
Total Bilirubin: 0.7 mg/dL (ref 0.3–1.2)
Total Protein: 7 g/dL (ref 6.5–8.1)

## 2016-08-20 LAB — CBC
HCT: 38.5 % — ABNORMAL LOW (ref 39.0–52.0)
Hemoglobin: 13.2 g/dL (ref 13.0–17.0)
MCH: 28.9 pg (ref 26.0–34.0)
MCHC: 34.3 g/dL (ref 30.0–36.0)
MCV: 84.2 fL (ref 78.0–100.0)
Platelets: 143 10*3/uL — ABNORMAL LOW (ref 150–400)
RBC: 4.57 MIL/uL (ref 4.22–5.81)
RDW: 14.6 % (ref 11.5–15.5)
WBC: 10.5 10*3/uL (ref 4.0–10.5)

## 2016-08-20 LAB — URINALYSIS, ROUTINE W REFLEX MICROSCOPIC
Bilirubin Urine: NEGATIVE
Glucose, UA: 250 mg/dL — AB
Hgb urine dipstick: NEGATIVE
Ketones, ur: NEGATIVE mg/dL
Leukocytes, UA: NEGATIVE
Nitrite: NEGATIVE
Protein, ur: NEGATIVE mg/dL
Specific Gravity, Urine: 1.01 (ref 1.005–1.030)
pH: 5.5 (ref 5.0–8.0)

## 2016-08-20 LAB — GLUCOSE, CAPILLARY: Glucose-Capillary: 225 mg/dL — ABNORMAL HIGH (ref 65–99)

## 2016-08-20 LAB — BLOOD GAS, ARTERIAL
Acid-base deficit: 0.4 mmol/L (ref 0.0–2.0)
Bicarbonate: 24.1 mmol/L (ref 20.0–28.0)
Drawn by: 313941
FIO2: 21
O2 Saturation: 96.1 %
Patient temperature: 98.6
pCO2 arterial: 41.8 mmHg (ref 32.0–48.0)
pH, Arterial: 7.378 (ref 7.350–7.450)
pO2, Arterial: 89.1 mmHg (ref 83.0–108.0)

## 2016-08-20 LAB — SURGICAL PCR SCREEN
MRSA, PCR: NEGATIVE
Staphylococcus aureus: NEGATIVE

## 2016-08-20 LAB — ABO/RH: ABO/RH(D): O NEG

## 2016-08-20 LAB — PROTIME-INR
INR: 0.97
Prothrombin Time: 12.9 seconds (ref 11.4–15.2)

## 2016-08-20 LAB — APTT: aPTT: 32 seconds (ref 24–36)

## 2016-08-20 MED ORDER — BUPROPION HCL ER (XL) 150 MG PO TB24: 150.0000 mg | ORAL_TABLET | Freq: Every day | ORAL | 1 refills | Status: DC

## 2016-08-20 NOTE — Progress Notes (Signed)
Pt denies any acute cardiopulmonary issues. Pt under the care of Dr. Martinique, Cardiology. Pt stated that he was instructed to stop taking Eliquis " last dose was Sunday night." Thurmond Butts, Therapist, sports, at surgeon's office advised that pt hold Aspirin on morning of procedure. Pt chart forwarded to anesthesia for review of cardiac history.

## 2016-08-20 NOTE — Telephone Encounter (Signed)
Ok to refill 

## 2016-08-20 NOTE — Telephone Encounter (Signed)
° °  Pt request refill of the following:     buPROPion (WELLBUTRIN XL) 150 MG 24 hr tablet   [facility]:   [facility] [place]

## 2016-08-20 NOTE — Pre-Procedure Instructions (Signed)
[name]  08/20/2016      [id number] [place], [place] - [address] [place] [address] Phone: [phone] Fax: [phone]    Your procedure is scheduled on Monday, August 24, 2016  Report to [facility] Admitting at 5:30 A.M.  Call this number if you have problems the morning of surgery:  [phone]   Remember: Follow doctors instructions regarding apixaban [name]  Do not eat food or drink liquids after midnight Sunday, August 23, 2016  Take these medicines the morning of surgery with A SIP OF WATER :buPROPion (WELLBUTRIN XL), metoprolol (LOPRESSOR), if needed: Tylenol for pain, Nitroglycerin for chest pain Stop taking vitamins, fish oil and herbal medications. Do not take any NSAIDs ie: Ibuprofen, Advil, Naproxen, BC and Goody Powder; stop now.    How to Manage Your Diabetes Before and After Surgery  Why is it important to control my blood sugar before and after surgery? . Improving blood sugar levels before and after surgery helps healing and can limit problems. . A way of improving blood sugar control is eating a healthy diet by: o  Eating less sugar and carbohydrates o  Increasing activity/exercise o  Talking with your doctor about reaching your blood sugar goals . High blood sugars (greater than 180 mg/dL) can raise your risk of infections and slow your recovery, so you will need to focus on controlling your diabetes during the weeks before surgery. . Make sure that the doctor who takes care of your diabetes knows about your planned surgery including the date and location.  How do I manage my blood sugar before surgery? . Check your blood sugar at least 4 times a day, starting 2 days before surgery, to make sure that the level is not too high or low. o Check your blood sugar the morning of your surgery when you wake up and every 2 hours until you get to the Short Stay  unit. . If your blood sugar is less than 70 mg/dL, you will need to treat for low blood sugar: o Do not take insulin. o Treat a low blood sugar (less than 70 mg/dL) with  cup of clear juice (cranberry or apple), 4 glucose tablets, OR glucose gel. o Recheck blood sugar in 15 minutes after treatment (to make sure it is greater than 70 mg/dL). If your blood sugar is not greater than 70 mg/dL on recheck, call [phone] for further instructions. . Report your blood sugar to the short stay nurse when you get to Short Stay.  . If you are admitted to the hospital after surgery: o Your blood sugar will be checked by the staff and you will probably be given insulin after surgery (instead of oral diabetes medicines) to make sure you have good blood sugar levels. o The goal for blood sugar control after surgery is 80-180 mg/dL.  WHAT DO I DO ABOUT MY DIABETES MEDICATION?   Marland Kitchen Do not take oral diabetes medicines (pills) the morning of surgery glipiZIDE (GLUCOTROL) and metFORMIN (GLUCOPHAGE)   Other Instructions: Do not take glipiZIDE (GLUCOTROL) the night before surgery  Patient Signature:  Date:   Nurse Signature:  Date:   Reviewed and Endorsed by [facility] Patient Education Committee, August 2015  Do not wear jewelry, make-up or nail polish.  Do not wear lotions, powders, or perfumes, or deoderant.  Do not shave 48 hours prior to surgery.  Men  may shave face and neck.  Do not bring valuables to the hospital.  [facility] is not responsible for any belongings or valuables.  Contacts, dentures or bridgework may not be worn into surgery.  Leave your suitcase in the car.  After surgery it may be brought to your room.  For patients admitted to the hospital, discharge time will be determined by your treatment team.  Patients discharged the day of surgery will not be allowed to drive home.   Special instructions: Shower the night before surgery and the morning of surgery with CHG.  Please  read over the following fact sheets that you were given. Pain Booklet, Coughing and Deep Breathing, Blood Transfusion Information, MRSA Information and Surgical Site Infection Prevention

## 2016-08-20 NOTE — Progress Notes (Signed)
Pt denies having a stress test. 

## 2016-08-20 NOTE — Telephone Encounter (Signed)
Rx sent in

## 2016-08-21 ENCOUNTER — Other Ambulatory Visit: Payer: Self-pay

## 2016-08-21 ENCOUNTER — Other Ambulatory Visit: Payer: Self-pay | Admitting: Emergency Medicine

## 2016-08-21 LAB — HEMOGLOBIN A1C
Hgb A1c MFr Bld: 8.2 % — ABNORMAL HIGH (ref 4.8–5.6)
Mean Plasma Glucose: 189 mg/dL

## 2016-08-21 MED ORDER — GLIPIZIDE 10 MG PO TABS: 10.0000 mg | ORAL_TABLET | Freq: Two times a day (BID) | ORAL | 0 refills | Status: DC

## 2016-08-21 NOTE — Progress Notes (Signed)
Anesthesia Chart Review: Patient is a 55 year old male scheduled for CABG on 08/24/16 by Dr. Prescott Gum.   History includes recent former smoker (quite 05/2016), DM2, left axillary abscess (MRSA) s/p I&D [id number] complicated by acute respiratory failure (BiPAP), NSTEMI (troponin 16.06), afib with RVR, and acute systolic CHF; CAD, acut ischemic cardiomyopathy, HTN, HLD, microscopic hematuria, lactose intolerance, skin cancer (BCC).  Patient goes by "[name]."  PCP is [name], NP. Cardiologist is Dr. Martinique.  Meds include Eliquis (on hold 08/16/16), aspirin 81 mg, Lipitor, Wellbutrin XL, Lasix, glipizide, KCl, metformin, metoprolol, nitroglycerin. LATEX allergy.  Vitals were not recorded at PAT. CBG 225.  08/20/16 EKG: NSR, inferior infarct (age undetermined), ST/T wave abnormality, consider lateral ischemia.  06/16/16 Cardiac cath: Conclusion:  Ost RCA to Prox RCA lesion, 50 %stenosed.  Prox RCA to Mid RCA lesion, 50 %stenosed.  Ost Ramus to Ramus lesion, 100 %stenosed.  Prox Cx lesion, 85 %stenosed.  Ost 2nd Mrg to 2nd Mrg lesion, 100 %stenosed.  Ost 4th Mrg to 4th Mrg lesion, 100 %stenosed.  Ost 3rd Mrg to 3rd Mrg lesion, 100 %stenosed.  Prox LAD lesion, 70 %stenosed.  Mid LAD to Dist LAD lesion, 50 %stenosed.  Dist LAD lesion, 50 %stenosed.  Ost LPDA to LPDA lesion, 100 %stenosed. Severe multivessel CAD with diffuse narrowing of the LAD with 60-70% proximal stenoses, 50% mid and distal diffuse stenoses; total occlusion of a high marginal/ramus intermediate vessel with distal retrograde filling; dominant left circumflex vessel with 85% very proximal stenosis, total occlusion of marginal branches and distal AV groove circumflex occlusion with faint collateralization to the PDA/PLA-like vessel, and proximal to mid 50% stenoses in a small nondominant RCA. - Prior echocardiographic documentation of an ejection fraction of 25-30%. - LVEDP 16 mmHg. RECOMMENDATION: There appears to  be significant thrombus present in the circumflex territory.  Heparin will be resumed.  Surgical consultation will be obtained for consideration of CABG revascularization.  06/12/16 Echo: Study Conclusions - Left ventricle: The cavity size was normal. There was mild   concentric hypertrophy. Systolic function was severely reduced.   The estimated ejection fraction was in the range of 25% to 30%.   Akinesis of the basal-midinferolateral and inferior myocardium.   Severe hypokinesis of the lateral myocardium. ; consistent with   infarction in the distribution of the right coronary and left   circumflex coronary artery. Features are consistent with a   pseudonormal left ventricular filling pattern, with concomitant   abnormal relaxation and increased filling pressure (grade 2   diastolic dysfunction). - Mitral valve: There was mild regurgitation. - Pericardium, extracardiac: A trivial pericardial effusion was   identified.  06/18/16 Carotid U/S: Mild plaque visualized with no evidence of a significant stenosis. 1-30% category.   08/20/16 CXR: IMPRESSION: No acute cardiopulmonary disease. Previously identified pulmonary edema noted on 06/18/2016 has cleared.  06/17/16 Spirometry: FVC 2.03 (47%), FEV1 1.54 (46%).   Preoperative labs noted. Cr 1.09, H/H 13.2/38.5, WBC 10.5, PLT 143. PT/PTT WNL. A1c 8.2 (down from 8.9 on 07/17/16). UA negative leukocytes and nitrites.  If no acute changes then I anticipate that he can proceed as planned.  George Hugh [facility] Short Stay Center/Anesthesiology Phone [phone] 08/21/2016 9:45 AM

## 2016-08-23 MED ORDER — TRANEXAMIC ACID 1000 MG/10ML IV SOLN
1.5000 mg/kg/h | INTRAVENOUS | Status: AC
  Administered 2016-08-24: 1.5 mg/kg/h via INTRAVENOUS
  Filled 2016-08-23: qty 25

## 2016-08-23 MED ORDER — PLASMA-LYTE 148 IV SOLN
INTRAVENOUS | Status: AC
  Administered 2016-08-24: 500 mL
  Filled 2016-08-23: qty 2.5

## 2016-08-23 MED ORDER — EPINEPHRINE PF 1 MG/ML IJ SOLN
0.0000 ug/min | INTRAVENOUS | Status: AC
  Administered 2016-08-24: 2 ug/min via INTRAVENOUS
  Filled 2016-08-23: qty 4

## 2016-08-23 MED ORDER — DEXMEDETOMIDINE HCL IN NACL 400 MCG/100ML IV SOLN
0.1000 ug/kg/h | INTRAVENOUS | Status: AC
  Administered 2016-08-24: 0.7 ug/kg/h via INTRAVENOUS
  Filled 2016-08-23: qty 100

## 2016-08-23 MED ORDER — DEXTROSE 5 % IV SOLN
750.0000 mg | INTRAVENOUS | Status: DC
  Filled 2016-08-23: qty 750

## 2016-08-23 MED ORDER — DOPAMINE-DEXTROSE 3.2-5 MG/ML-% IV SOLN
0.0000 ug/kg/min | INTRAVENOUS | Status: AC
  Administered 2016-08-24: 2.5 ug/kg/min via INTRAVENOUS
  Filled 2016-08-23: qty 250

## 2016-08-23 MED ORDER — PHENYLEPHRINE HCL 10 MG/ML IJ SOLN
30.0000 ug/min | INTRAMUSCULAR | Status: AC
  Administered 2016-08-24: 25 ug/min via INTRAVENOUS
  Filled 2016-08-23: qty 2

## 2016-08-23 MED ORDER — MAGNESIUM SULFATE 50 % IJ SOLN
40.0000 meq | INTRAMUSCULAR | Status: DC
  Filled 2016-08-23: qty 10

## 2016-08-23 MED ORDER — POTASSIUM CHLORIDE 2 MEQ/ML IV SOLN
80.0000 meq | INTRAVENOUS | Status: DC
  Filled 2016-08-23: qty 40

## 2016-08-23 MED ORDER — DEXTROSE 5 % IV SOLN
1.5000 g | INTRAVENOUS | Status: AC
  Administered 2016-08-24: 1.5 g via INTRAVENOUS
  Administered 2016-08-24: .75 g via INTRAVENOUS
  Filled 2016-08-23 (×2): qty 1.5

## 2016-08-23 MED ORDER — METOPROLOL TARTRATE 12.5 MG HALF TABLET: 12.5000 mg | ORAL_TABLET | Freq: Once | ORAL | Status: DC

## 2016-08-23 MED ORDER — TRANEXAMIC ACID (OHS) PUMP PRIME SOLUTION
2.0000 mg/kg | INTRAVENOUS | Status: DC
  Filled 2016-08-23: qty 1.42

## 2016-08-23 MED ORDER — VANCOMYCIN HCL 10 G IV SOLR
1250.0000 mg | INTRAVENOUS | Status: AC
  Administered 2016-08-24: 1250 mg via INTRAVENOUS
  Filled 2016-08-23: qty 1250

## 2016-08-23 MED ORDER — NITROGLYCERIN IN D5W 200-5 MCG/ML-% IV SOLN
2.0000 ug/min | INTRAVENOUS | Status: DC
  Filled 2016-08-23: qty 250

## 2016-08-23 MED ORDER — TRANEXAMIC ACID (OHS) BOLUS VIA INFUSION
15.0000 mg/kg | INTRAVENOUS | Status: AC
  Administered 2016-08-24: 1062 mg via INTRAVENOUS
  Filled 2016-08-23: qty 1062

## 2016-08-23 MED ORDER — SODIUM CHLORIDE 0.9 % IV SOLN
INTRAVENOUS | Status: DC
  Filled 2016-08-23: qty 30

## 2016-08-23 MED ORDER — SODIUM CHLORIDE 0.9 % IV SOLN
INTRAVENOUS | Status: AC
  Administered 2016-08-24: 2.2 [IU]/h via INTRAVENOUS
  Filled 2016-08-23: qty 2.5

## 2016-08-24 ENCOUNTER — Inpatient Hospital Stay (HOSPITAL_COMMUNITY): Payer: BLUE CROSS/BLUE SHIELD

## 2016-08-24 ENCOUNTER — Inpatient Hospital Stay (HOSPITAL_COMMUNITY)
Admission: RE | Admit: 2016-08-24 | Discharge: 2016-09-04 | DRG: 236 | Disposition: A | Discharge status: Home / Self Care | Payer: BLUE CROSS/BLUE SHIELD | Source: Ambulatory Visit | Attending: Cardiothoracic Surgery | Admitting: Cardiothoracic Surgery

## 2016-08-24 ENCOUNTER — Inpatient Hospital Stay (HOSPITAL_COMMUNITY): Payer: BLUE CROSS/BLUE SHIELD | Admitting: Certified Registered Nurse Anesthetist

## 2016-08-24 ENCOUNTER — Encounter (HOSPITAL_COMMUNITY): Payer: Self-pay | Admitting: General Practice

## 2016-08-24 ENCOUNTER — Encounter (HOSPITAL_COMMUNITY)
Admission: RE | Disposition: A | Discharge status: Home / Self Care | Payer: Self-pay | Source: Ambulatory Visit | Attending: Cardiothoracic Surgery

## 2016-08-24 DIAGNOSIS — J9811 Atelectasis: Secondary | ICD-10-CM

## 2016-08-24 DIAGNOSIS — I454 Nonspecific intraventricular block: Secondary | ICD-10-CM | POA: Diagnosis present

## 2016-08-24 DIAGNOSIS — E8770 Fluid overload, unspecified: Secondary | ICD-10-CM | POA: Diagnosis not present

## 2016-08-24 DIAGNOSIS — E1165 Type 2 diabetes mellitus with hyperglycemia: Secondary | ICD-10-CM | POA: Diagnosis present

## 2016-08-24 DIAGNOSIS — J90 Pleural effusion, not elsewhere classified: Secondary | ICD-10-CM | POA: Diagnosis present

## 2016-08-24 DIAGNOSIS — I509 Heart failure, unspecified: Secondary | ICD-10-CM

## 2016-08-24 DIAGNOSIS — Z951 Presence of aortocoronary bypass graft: Secondary | ICD-10-CM

## 2016-08-24 DIAGNOSIS — Z91040 Latex allergy status: Secondary | ICD-10-CM | POA: Diagnosis not present

## 2016-08-24 DIAGNOSIS — Z8614 Personal history of Methicillin resistant Staphylococcus aureus infection: Secondary | ICD-10-CM

## 2016-08-24 DIAGNOSIS — D62 Acute posthemorrhagic anemia: Secondary | ICD-10-CM | POA: Diagnosis not present

## 2016-08-24 DIAGNOSIS — Z7901 Long term (current) use of anticoagulants: Secondary | ICD-10-CM

## 2016-08-24 DIAGNOSIS — I480 Paroxysmal atrial fibrillation: Secondary | ICD-10-CM | POA: Diagnosis present

## 2016-08-24 DIAGNOSIS — Z7984 Long term (current) use of oral hypoglycemic drugs: Secondary | ICD-10-CM

## 2016-08-24 DIAGNOSIS — I10 Essential (primary) hypertension: Secondary | ICD-10-CM | POA: Diagnosis not present

## 2016-08-24 DIAGNOSIS — Z79899 Other long term (current) drug therapy: Secondary | ICD-10-CM

## 2016-08-24 DIAGNOSIS — I2510 Atherosclerotic heart disease of native coronary artery without angina pectoris: Secondary | ICD-10-CM | POA: Diagnosis present

## 2016-08-24 DIAGNOSIS — I252 Old myocardial infarction: Secondary | ICD-10-CM | POA: Diagnosis not present

## 2016-08-24 DIAGNOSIS — Z7982 Long term (current) use of aspirin: Secondary | ICD-10-CM | POA: Diagnosis not present

## 2016-08-24 DIAGNOSIS — I25119 Atherosclerotic heart disease of native coronary artery with unspecified angina pectoris: Secondary | ICD-10-CM | POA: Diagnosis present

## 2016-08-24 DIAGNOSIS — Z87891 Personal history of nicotine dependence: Secondary | ICD-10-CM | POA: Diagnosis not present

## 2016-08-24 DIAGNOSIS — Z9889 Other specified postprocedural states: Secondary | ICD-10-CM

## 2016-08-24 DIAGNOSIS — E876 Hypokalemia: Secondary | ICD-10-CM | POA: Diagnosis present

## 2016-08-24 DIAGNOSIS — I255 Ischemic cardiomyopathy: Secondary | ICD-10-CM | POA: Diagnosis present

## 2016-08-24 HISTORY — PX: TEE WITHOUT CARDIOVERSION: SHX5443

## 2016-08-24 HISTORY — PX: CLIPPING OF ATRIAL APPENDAGE: SHX5773

## 2016-08-24 HISTORY — DX: Presence of aortocoronary bypass graft: Z95.1

## 2016-08-24 HISTORY — DX: Other specified postprocedural states: Z98.890

## 2016-08-24 HISTORY — PX: CORONARY ARTERY BYPASS GRAFT: SHX141

## 2016-08-24 LAB — CBC
HCT: 31.5 % — ABNORMAL LOW (ref 39.0–52.0)
HEMATOCRIT: 32.5 % — AB (ref 39.0–52.0)
HEMOGLOBIN: 11.1 g/dL — AB (ref 13.0–17.0)
Hemoglobin: 10.7 g/dL — ABNORMAL LOW (ref 13.0–17.0)
MCH: 28.2 pg (ref 26.0–34.0)
MCH: 28.2 pg (ref 26.0–34.0)
MCHC: 34.2 g/dL (ref 30.0–36.0)
MCHC: 34 g/dL (ref 30.0–36.0)
MCV: 82.5 fL (ref 78.0–100.0)
MCV: 82.9 fL (ref 78.0–100.0)
Platelets: 142 10*3/uL — ABNORMAL LOW (ref 150–400)
Platelets: 147 10*3/uL — ABNORMAL LOW (ref 150–400)
RBC: 3.94 MIL/uL — ABNORMAL LOW (ref 4.22–5.81)
RBC: 3.8 MIL/uL — ABNORMAL LOW (ref 4.22–5.81)
RDW: 14.1 % (ref 11.5–15.5)
RDW: 14.1 % (ref 11.5–15.5)
WBC: 15.4 10*3/uL — AB (ref 4.0–10.5)
WBC: 14 10*3/uL — ABNORMAL HIGH (ref 4.0–10.5)

## 2016-08-24 LAB — POCT I-STAT 3, ART BLOOD GAS (G3+)
Acid-Base Excess: 2 mmol/L (ref 0.0–2.0)
Acid-Base Excess: 1 mmol/L (ref 0.0–2.0)
Acid-base deficit: 1 mmol/L (ref 0.0–2.0)
Acid-base deficit: 1 mmol/L (ref 0.0–2.0)
Bicarbonate: 25.8 mmol/L (ref 20.0–28.0)
Bicarbonate: 26.4 mmol/L (ref 20.0–28.0)
Bicarbonate: 26.1 mmol/L (ref 20.0–28.0)
Bicarbonate: 25.8 mmol/L (ref 20.0–28.0)
Bicarbonate: 24.9 mmol/L (ref 20.0–28.0)
Bicarbonate: 24.2 mmol/L (ref 20.0–28.0)
Bicarbonate: 23.9 mmol/L (ref 20.0–28.0)
O2 Saturation: 100 %
O2 Saturation: 100 %
O2 Saturation: 99 %
O2 Saturation: 99 %
O2 Saturation: 99 %
O2 Saturation: 99 %
O2 Saturation: 98 %
Patient temperature: 36
Patient temperature: 37.1
Patient temperature: 37.5
Patient temperature: 37.6
TCO2: 27 mmol/L (ref 0–100)
TCO2: 28 mmol/L (ref 0–100)
TCO2: 27 mmol/L (ref 0–100)
TCO2: 27 mmol/L (ref 0–100)
TCO2: 26 mmol/L (ref 0–100)
TCO2: 25 mmol/L (ref 0–100)
TCO2: 25 mmol/L (ref 0–100)
pCO2 arterial: 46.7 mmHg (ref 32.0–48.0)
pCO2 arterial: 41.4 mmHg (ref 32.0–48.0)
pCO2 arterial: 43.5 mmHg (ref 32.0–48.0)
pCO2 arterial: 43.1 mmHg (ref 32.0–48.0)
pCO2 arterial: 40.4 mmHg (ref 32.0–48.0)
pCO2 arterial: 42.2 mmHg (ref 32.0–48.0)
pCO2 arterial: 42.7 mmHg (ref 32.0–48.0)
pH, Arterial: 7.351 (ref 7.350–7.450)
pH, Arterial: 7.412 (ref 7.350–7.450)
pH, Arterial: 7.387 (ref 7.350–7.450)
pH, Arterial: 7.381 (ref 7.350–7.450)
pH, Arterial: 7.398 (ref 7.350–7.450)
pH, Arterial: 7.368 (ref 7.350–7.450)
pH, Arterial: 7.358 (ref 7.350–7.450)
pO2, Arterial: 420 mmHg — ABNORMAL HIGH (ref 83.0–108.0)
pO2, Arterial: 458 mmHg — ABNORMAL HIGH (ref 83.0–108.0)
pO2, Arterial: 137 mmHg — ABNORMAL HIGH (ref 83.0–108.0)
pO2, Arterial: 165 mmHg — ABNORMAL HIGH (ref 83.0–108.0)
pO2, Arterial: 146 mmHg — ABNORMAL HIGH (ref 83.0–108.0)
pO2, Arterial: 124 mmHg — ABNORMAL HIGH (ref 83.0–108.0)
pO2, Arterial: 105 mmHg (ref 83.0–108.0)

## 2016-08-24 LAB — GLUCOSE, CAPILLARY
Glucose-Capillary: 138 mg/dL — ABNORMAL HIGH (ref 65–99)
Glucose-Capillary: 209 mg/dL — ABNORMAL HIGH (ref 65–99)
Glucose-Capillary: 243 mg/dL — ABNORMAL HIGH (ref 65–99)
Glucose-Capillary: 213 mg/dL — ABNORMAL HIGH (ref 65–99)
Glucose-Capillary: 211 mg/dL — ABNORMAL HIGH (ref 65–99)
Glucose-Capillary: 215 mg/dL — ABNORMAL HIGH (ref 65–99)
Glucose-Capillary: 192 mg/dL — ABNORMAL HIGH (ref 65–99)
Glucose-Capillary: 231 mg/dL — ABNORMAL HIGH (ref 65–99)
Glucose-Capillary: 174 mg/dL — ABNORMAL HIGH (ref 65–99)

## 2016-08-24 LAB — POCT I-STAT, CHEM 8
BUN: 26 mg/dL — ABNORMAL HIGH (ref 6–20)
BUN: 18 mg/dL (ref 6–20)
BUN: 24 mg/dL — ABNORMAL HIGH (ref 6–20)
BUN: 21 mg/dL — ABNORMAL HIGH (ref 6–20)
BUN: 22 mg/dL — ABNORMAL HIGH (ref 6–20)
BUN: 33 mg/dL — ABNORMAL HIGH (ref 6–20)
BUN: 19 mg/dL (ref 6–20)
Calcium, Ion: 1.13 mmol/L — ABNORMAL LOW (ref 1.15–1.40)
Calcium, Ion: 0.84 mmol/L — CL (ref 1.15–1.40)
Calcium, Ion: 1.07 mmol/L — ABNORMAL LOW (ref 1.15–1.40)
Calcium, Ion: 0.96 mmol/L — ABNORMAL LOW (ref 1.15–1.40)
Calcium, Ion: 0.97 mmol/L — ABNORMAL LOW (ref 1.15–1.40)
Calcium, Ion: 0.97 mmol/L — ABNORMAL LOW (ref 1.15–1.40)
Calcium, Ion: 1.11 mmol/L — ABNORMAL LOW (ref 1.15–1.40)
Chloride: 100 mmol/L — ABNORMAL LOW (ref 101–111)
Chloride: 100 mmol/L — ABNORMAL LOW (ref 101–111)
Chloride: 104 mmol/L (ref 101–111)
Chloride: 100 mmol/L — ABNORMAL LOW (ref 101–111)
Chloride: 100 mmol/L — ABNORMAL LOW (ref 101–111)
Chloride: 96 mmol/L — ABNORMAL LOW (ref 101–111)
Chloride: 103 mmol/L (ref 101–111)
Creatinine, Ser: 0.8 mg/dL (ref 0.61–1.24)
Creatinine, Ser: 0.5 mg/dL — ABNORMAL LOW (ref 0.61–1.24)
Creatinine, Ser: 0.8 mg/dL (ref 0.61–1.24)
Creatinine, Ser: 0.7 mg/dL (ref 0.61–1.24)
Creatinine, Ser: 0.8 mg/dL (ref 0.61–1.24)
Creatinine, Ser: 0.9 mg/dL (ref 0.61–1.24)
Creatinine, Ser: 1.1 mg/dL (ref 0.61–1.24)
Glucose, Bld: 169 mg/dL — ABNORMAL HIGH (ref 65–99)
Glucose, Bld: 109 mg/dL — ABNORMAL HIGH (ref 65–99)
Glucose, Bld: 134 mg/dL — ABNORMAL HIGH (ref 65–99)
Glucose, Bld: 124 mg/dL — ABNORMAL HIGH (ref 65–99)
Glucose, Bld: 136 mg/dL — ABNORMAL HIGH (ref 65–99)
Glucose, Bld: 138 mg/dL — ABNORMAL HIGH (ref 65–99)
Glucose, Bld: 196 mg/dL — ABNORMAL HIGH (ref 65–99)
HCT: 32 % — ABNORMAL LOW (ref 39.0–52.0)
HCT: 23 % — ABNORMAL LOW (ref 39.0–52.0)
HCT: 29 % — ABNORMAL LOW (ref 39.0–52.0)
HCT: 23 % — ABNORMAL LOW (ref 39.0–52.0)
HCT: 24 % — ABNORMAL LOW (ref 39.0–52.0)
HCT: 24 % — ABNORMAL LOW (ref 39.0–52.0)
HCT: 32 % — ABNORMAL LOW (ref 39.0–52.0)
Hemoglobin: 10.9 g/dL — ABNORMAL LOW (ref 13.0–17.0)
Hemoglobin: 7.8 g/dL — ABNORMAL LOW (ref 13.0–17.0)
Hemoglobin: 9.9 g/dL — ABNORMAL LOW (ref 13.0–17.0)
Hemoglobin: 7.8 g/dL — ABNORMAL LOW (ref 13.0–17.0)
Hemoglobin: 8.2 g/dL — ABNORMAL LOW (ref 13.0–17.0)
Hemoglobin: 8.2 g/dL — ABNORMAL LOW (ref 13.0–17.0)
Hemoglobin: 10.9 g/dL — ABNORMAL LOW (ref 13.0–17.0)
Potassium: 3.7 mmol/L (ref 3.5–5.1)
Potassium: 3.5 mmol/L (ref 3.5–5.1)
Potassium: 3.6 mmol/L (ref 3.5–5.1)
Potassium: 3.8 mmol/L (ref 3.5–5.1)
Potassium: 4.3 mmol/L (ref 3.5–5.1)
Potassium: 3.9 mmol/L (ref 3.5–5.1)
Potassium: 3.8 mmol/L (ref 3.5–5.1)
Sodium: 142 mmol/L (ref 135–145)
Sodium: 142 mmol/L (ref 135–145)
Sodium: 142 mmol/L (ref 135–145)
Sodium: 138 mmol/L (ref 135–145)
Sodium: 138 mmol/L (ref 135–145)
Sodium: 138 mmol/L (ref 135–145)
Sodium: 143 mmol/L (ref 135–145)
TCO2: 26 mmol/L (ref 0–100)
TCO2: 26 mmol/L (ref 0–100)
TCO2: 26 mmol/L (ref 0–100)
TCO2: 28 mmol/L (ref 0–100)
TCO2: 28 mmol/L (ref 0–100)
TCO2: 24 mmol/L (ref 0–100)
TCO2: 22 mmol/L (ref 0–100)

## 2016-08-24 LAB — ECHO INTRAOPERATIVE TEE
HEIGHTINCHES: 66 in
Weight: 2496 oz

## 2016-08-24 LAB — CREATININE, SERUM
Creatinine, Ser: 1.25 mg/dL — ABNORMAL HIGH (ref 0.61–1.24)
GFR calc Af Amer: >60 mL/min (ref >60)
GFR calc non Af Amer: >60 mL/min (ref >60)

## 2016-08-24 LAB — PROTIME-INR
INR: 1.3
Prothrombin Time: 16.2 seconds — ABNORMAL HIGH (ref 11.4–15.2)

## 2016-08-24 LAB — MAGNESIUM: Magnesium: 2.6 mg/dL — ABNORMAL HIGH (ref 1.7–2.4)

## 2016-08-24 LAB — PREPARE RBC (CROSSMATCH)

## 2016-08-24 LAB — HEMOGLOBIN AND HEMATOCRIT, BLOOD
HCT: 22.9 % — ABNORMAL LOW (ref 39.0–52.0)
Hemoglobin: 7.8 g/dL — ABNORMAL LOW (ref 13.0–17.0)

## 2016-08-24 LAB — POCT I-STAT 4, (NA,K, GLUC, HGB,HCT)
Glucose, Bld: 208 mg/dL — ABNORMAL HIGH (ref 65–99)
HCT: 33 % — ABNORMAL LOW (ref 39.0–52.0)
Hemoglobin: 11.2 g/dL — ABNORMAL LOW (ref 13.0–17.0)
Potassium: 3 mmol/L — ABNORMAL LOW (ref 3.5–5.1)
Sodium: 142 mmol/L (ref 135–145)

## 2016-08-24 LAB — APTT: APTT: 28 s (ref 24–36)

## 2016-08-24 LAB — PLATELET COUNT: Platelets: 104 10*3/uL — ABNORMAL LOW (ref 150–400)

## 2016-08-24 SURGERY — CORONARY ARTERY BYPASS GRAFTING (CABG)
Anesthesia: General | Site: Chest

## 2016-08-24 MED ORDER — ACETAMINOPHEN 160 MG/5ML PO SOLN: 1000.0000 mg | Freq: Four times a day (QID) | ORAL | Status: AC

## 2016-08-24 MED ORDER — LACTATED RINGERS IV SOLN: INTRAVENOUS | Status: DC

## 2016-08-24 MED ORDER — HYDROCORTISONE NA SUCCINATE PF 250 MG IJ SOLR
INTRAMUSCULAR | Status: AC
  Filled 2016-08-24: qty 250

## 2016-08-24 MED ORDER — INSULIN REGULAR BOLUS VIA INFUSION
0.0000 [IU] | Freq: Three times a day (TID) | INTRAVENOUS | Status: DC
  Filled 2016-08-24: qty 10

## 2016-08-24 MED ORDER — PANTOPRAZOLE SODIUM 40 MG PO TBEC
40.0000 mg | DELAYED_RELEASE_TABLET | Freq: Every day | ORAL | Status: DC
  Administered 2016-08-26 – 2016-09-04 (×10): 40 mg via ORAL
  Filled 2016-08-24 (×10): qty 1

## 2016-08-24 MED ORDER — SODIUM CHLORIDE 0.9% FLUSH
3.0000 mL | Freq: Two times a day (BID) | INTRAVENOUS | Status: DC
  Administered 2016-08-25 – 2016-08-29 (×9): 3 mL via INTRAVENOUS

## 2016-08-24 MED ORDER — DEXMEDETOMIDINE HCL IN NACL 200 MCG/50ML IV SOLN: 0.0000 ug/kg/h | INTRAVENOUS | Status: DC

## 2016-08-24 MED ORDER — VANCOMYCIN HCL IN DEXTROSE 1-5 GM/200ML-% IV SOLN
1000.0000 mg | Freq: Two times a day (BID) | INTRAVENOUS | Status: AC
  Administered 2016-08-24 – 2016-08-25 (×3): 1000 mg via INTRAVENOUS
  Filled 2016-08-24 (×3): qty 200

## 2016-08-24 MED ORDER — ASPIRIN 81 MG PO CHEW: 324.0000 mg | CHEWABLE_TABLET | Freq: Every day | ORAL | Status: DC

## 2016-08-24 MED ORDER — ETOMIDATE 2 MG/ML IV SOLN
INTRAVENOUS | Status: AC
Start: 2016-08-24 — End: 2016-08-24
  Filled 2016-08-24: qty 10

## 2016-08-24 MED ORDER — PHENYLEPHRINE 40 MCG/ML (10ML) SYRINGE FOR IV PUSH (FOR BLOOD PRESSURE SUPPORT)
PREFILLED_SYRINGE | INTRAVENOUS | Status: AC
  Filled 2016-08-24: qty 20

## 2016-08-24 MED ORDER — METOPROLOL TARTRATE 25 MG/10 ML ORAL SUSPENSION: 12.5000 mg | Freq: Two times a day (BID) | ORAL | Status: DC

## 2016-08-24 MED ORDER — BUPROPION HCL ER (XL) 150 MG PO TB24
150.0000 mg | ORAL_TABLET | Freq: Every day | ORAL | Status: DC
  Administered 2016-08-25 – 2016-09-04 (×11): 150 mg via ORAL
  Filled 2016-08-24 (×11): qty 1

## 2016-08-24 MED ORDER — METOCLOPRAMIDE HCL 5 MG/ML IJ SOLN
10.0000 mg | Freq: Four times a day (QID) | INTRAMUSCULAR | Status: AC
  Administered 2016-08-24 – 2016-08-30 (×24): 10 mg via INTRAVENOUS
  Filled 2016-08-24 (×23): qty 2

## 2016-08-24 MED ORDER — ONDANSETRON HCL 4 MG/2ML IJ SOLN
4.0000 mg | Freq: Four times a day (QID) | INTRAMUSCULAR | Status: DC | PRN
  Administered 2016-08-27 (×2): 4 mg via INTRAVENOUS
  Filled 2016-08-24: qty 2

## 2016-08-24 MED ORDER — CALCIUM CHLORIDE 10 % IV SOLN
INTRAVENOUS | Status: AC
  Filled 2016-08-24: qty 10

## 2016-08-24 MED ORDER — MAGNESIUM SULFATE 4 GM/100ML IV SOLN
4.0000 g | Freq: Once | INTRAVENOUS | Status: AC
  Administered 2016-08-24: 4 g via INTRAVENOUS
  Filled 2016-08-24: qty 100

## 2016-08-24 MED ORDER — MUPIROCIN 2 % EX OINT
TOPICAL_OINTMENT | CUTANEOUS | Status: AC
  Administered 2016-08-24: 1
  Filled 2016-08-24: qty 22

## 2016-08-24 MED ORDER — LACTATED RINGERS IV SOLN
INTRAVENOUS | Status: DC
  Administered 2016-08-25: 16:00:00 via INTRAVENOUS

## 2016-08-24 MED ORDER — NITROGLYCERIN IN D5W 200-5 MCG/ML-% IV SOLN
0.0000 ug/min | INTRAVENOUS | Status: DC
Start: 2016-08-24 — End: 2016-08-25

## 2016-08-24 MED ORDER — SODIUM CHLORIDE 0.9 % IV SOLN
INTRAVENOUS | Status: DC | PRN
  Administered 2016-08-24 (×3): via INTRAVENOUS

## 2016-08-24 MED ORDER — MORPHINE SULFATE (PF) 2 MG/ML IV SOLN
2.0000 mg | INTRAVENOUS | Status: DC | PRN
  Administered 2016-08-24 (×4): 2 mg via INTRAVENOUS
  Administered 2016-08-25 (×7): 4 mg via INTRAVENOUS
  Administered 2016-08-26: 2 mg via INTRAVENOUS
  Filled 2016-08-24 (×2): qty 2
  Filled 2016-08-24 (×3): qty 1
  Filled 2016-08-24: qty 2
  Filled 2016-08-24 (×2): qty 1
  Filled 2016-08-24: qty 2
  Filled 2016-08-24: qty 1
  Filled 2016-08-24: qty 2
  Filled 2016-08-24 (×2): qty 1
  Filled 2016-08-24: qty 2

## 2016-08-24 MED ORDER — SODIUM CHLORIDE 0.9% FLUSH: 3.0000 mL | INTRAVENOUS | Status: DC | PRN

## 2016-08-24 MED ORDER — HEPARIN SODIUM (PORCINE) 1000 UNIT/ML IJ SOLN
INTRAMUSCULAR | Status: AC
  Filled 2016-08-24: qty 1

## 2016-08-24 MED ORDER — ACETAMINOPHEN 500 MG PO TABS
1000.0000 mg | ORAL_TABLET | Freq: Four times a day (QID) | ORAL | Status: AC
  Administered 2016-08-25 – 2016-08-29 (×17): 1000 mg via ORAL
  Filled 2016-08-24 (×19): qty 2

## 2016-08-24 MED ORDER — AMIODARONE HCL IN DEXTROSE 360-4.14 MG/200ML-% IV SOLN
60.0000 mg/h | INTRAVENOUS | Status: AC
  Administered 2016-08-24: 60 mg/h via INTRAVENOUS
  Filled 2016-08-24: qty 200

## 2016-08-24 MED ORDER — POTASSIUM CHLORIDE 10 MEQ/50ML IV SOLN
10.0000 meq | INTRAVENOUS | Status: AC
  Administered 2016-08-24 (×3): 10 meq via INTRAVENOUS

## 2016-08-24 MED ORDER — AMIODARONE HCL IN DEXTROSE 360-4.14 MG/200ML-% IV SOLN
30.0000 mg/h | INTRAVENOUS | Status: DC
  Administered 2016-08-25: 30 mg/h via INTRAVENOUS
  Filled 2016-08-24: qty 200

## 2016-08-24 MED ORDER — PROPOFOL 10 MG/ML IV BOLUS
INTRAVENOUS | Status: DC | PRN
  Administered 2016-08-24: 60 mg via INTRAVENOUS

## 2016-08-24 MED ORDER — BISACODYL 10 MG RE SUPP: 10.0000 mg | Freq: Every day | RECTAL | Status: DC

## 2016-08-24 MED ORDER — MIDAZOLAM HCL 2 MG/2ML IJ SOLN: 2.0000 mg | INTRAMUSCULAR | Status: DC | PRN

## 2016-08-24 MED ORDER — FENTANYL CITRATE (PF) 250 MCG/5ML IJ SOLN
INTRAMUSCULAR | Status: DC | PRN
  Administered 2016-08-24 (×3): 100 ug via INTRAVENOUS
  Administered 2016-08-24: 50 ug via INTRAVENOUS
  Administered 2016-08-24 (×2): 150 ug via INTRAVENOUS
  Administered 2016-08-24: 50 ug via INTRAVENOUS
  Administered 2016-08-24 (×3): 100 ug via INTRAVENOUS
  Administered 2016-08-24: 150 ug via INTRAVENOUS
  Administered 2016-08-24 (×2): 100 ug via INTRAVENOUS

## 2016-08-24 MED ORDER — SODIUM CHLORIDE 0.9 % IV SOLN
INTRAVENOUS | Status: DC
  Filled 2016-08-24: qty 2.5

## 2016-08-24 MED ORDER — EPINEPHRINE PF 1 MG/10ML IJ SOSY
PREFILLED_SYRINGE | INTRAMUSCULAR | Status: AC
  Filled 2016-08-24: qty 10

## 2016-08-24 MED ORDER — FAMOTIDINE IN NACL 20-0.9 MG/50ML-% IV SOLN: 20.0000 mg | Freq: Two times a day (BID) | INTRAVENOUS | Status: DC

## 2016-08-24 MED ORDER — FENTANYL CITRATE (PF) 250 MCG/5ML IJ SOLN
INTRAMUSCULAR | Status: AC
  Filled 2016-08-24: qty 25

## 2016-08-24 MED ORDER — ARTIFICIAL TEARS OP OINT
TOPICAL_OINTMENT | OPHTHALMIC | Status: DC | PRN
  Administered 2016-08-24: 1 via OPHTHALMIC

## 2016-08-24 MED ORDER — DEXTROSE 5 % IV SOLN
2.0000 ug/min | INTRAVENOUS | Status: DC
  Filled 2016-08-24: qty 4

## 2016-08-24 MED ORDER — DIPHENHYDRAMINE HCL 50 MG/ML IJ SOLN
INTRAMUSCULAR | Status: DC | PRN
  Administered 2016-08-24: 50 mg via INTRAVENOUS

## 2016-08-24 MED ORDER — HEMOSTATIC AGENTS (NO CHARGE) OPTIME
TOPICAL | Status: DC | PRN
  Administered 2016-08-24 (×2): 1 via TOPICAL

## 2016-08-24 MED ORDER — ARTIFICIAL TEARS OP OINT
TOPICAL_OINTMENT | OPHTHALMIC | Status: AC
  Filled 2016-08-24: qty 3.5

## 2016-08-24 MED ORDER — EPINEPHRINE PF 1 MG/10ML IJ SOSY
PREFILLED_SYRINGE | INTRAMUSCULAR | Status: DC | PRN
  Administered 2016-08-24 (×2): 50 ug via INTRAVENOUS

## 2016-08-24 MED ORDER — DIPHENHYDRAMINE HCL 50 MG/ML IJ SOLN
INTRAMUSCULAR | Status: AC
  Filled 2016-08-24: qty 1

## 2016-08-24 MED ORDER — POTASSIUM CHLORIDE 10 MEQ/50ML IV SOLN
10.0000 meq | INTRAVENOUS | Status: AC
  Administered 2016-08-24 (×2): 10 meq via INTRAVENOUS

## 2016-08-24 MED ORDER — ASPIRIN EC 325 MG PO TBEC
325.0000 mg | DELAYED_RELEASE_TABLET | Freq: Every day | ORAL | Status: DC
  Administered 2016-08-25 – 2016-08-28 (×4): 325 mg via ORAL
  Filled 2016-08-24 (×4): qty 1

## 2016-08-24 MED ORDER — MILRINONE LACTATE IN DEXTROSE 20-5 MG/100ML-% IV SOLN
0.3000 ug/kg/min | INTRAVENOUS | Status: DC
  Administered 2016-08-25 – 2016-08-26 (×3): 0.3 ug/kg/min via INTRAVENOUS
  Filled 2016-08-24 (×3): qty 100

## 2016-08-24 MED ORDER — HEPARIN SODIUM (PORCINE) 1000 UNIT/ML IJ SOLN
INTRAMUSCULAR | Status: DC | PRN
  Administered 2016-08-24: 3000 [IU] via INTRAVENOUS
  Administered 2016-08-24: 20000 [IU] via INTRAVENOUS

## 2016-08-24 MED ORDER — ACETAMINOPHEN 650 MG RE SUPP
650.0000 mg | Freq: Once | RECTAL | Status: AC
  Administered 2016-08-24: 650 mg via RECTAL

## 2016-08-24 MED ORDER — POTASSIUM CHLORIDE 10 MEQ/50ML IV SOLN: 10.0000 meq | INTRAVENOUS | Status: DC

## 2016-08-24 MED ORDER — ALBUMIN HUMAN 5 % IV SOLN
INTRAVENOUS | Status: DC | PRN
  Administered 2016-08-24: 13:00:00 via INTRAVENOUS

## 2016-08-24 MED ORDER — NICOTINE 7 MG/24HR TD PT24: 7.0000 mg | MEDICATED_PATCH | Freq: Every day | TRANSDERMAL | Status: DC

## 2016-08-24 MED ORDER — SODIUM CHLORIDE 0.9 % IJ SOLN
INTRAMUSCULAR | Status: DC | PRN
  Administered 2016-08-24 (×3): 4 mL via TOPICAL

## 2016-08-24 MED ORDER — ALBUMIN HUMAN 5 % IV SOLN
250.0000 mL | INTRAVENOUS | Status: AC | PRN
  Administered 2016-08-24: 250 mL via INTRAVENOUS

## 2016-08-24 MED ORDER — METOPROLOL TARTRATE 5 MG/5ML IV SOLN
2.5000 mg | INTRAVENOUS | Status: DC | PRN
  Administered 2016-08-27: 5 mg via INTRAVENOUS
  Filled 2016-08-24: qty 5

## 2016-08-24 MED ORDER — DOCUSATE SODIUM 100 MG PO CAPS
200.0000 mg | ORAL_CAPSULE | Freq: Every day | ORAL | Status: DC
  Administered 2016-08-26 – 2016-09-04 (×9): 200 mg via ORAL
  Filled 2016-08-24 (×10): qty 2

## 2016-08-24 MED ORDER — ACETAMINOPHEN 160 MG/5ML PO SOLN: 650.0000 mg | Freq: Once | ORAL | Status: AC

## 2016-08-24 MED ORDER — MUPIROCIN CALCIUM 2 % EX CREA
TOPICAL_CREAM | Freq: Two times a day (BID) | CUTANEOUS | Status: DC
  Administered 2016-08-25: 1 via TOPICAL
  Administered 2016-08-26 – 2016-08-29 (×8): via TOPICAL
  Administered 2016-08-30: 1 via TOPICAL
  Administered 2016-08-30: 09:00:00 via TOPICAL
  Administered 2016-08-31: 1 via TOPICAL
  Filled 2016-08-24: qty 15

## 2016-08-24 MED ORDER — ORAL CARE MOUTH RINSE
15.0000 mL | Freq: Two times a day (BID) | OROMUCOSAL | Status: DC
  Administered 2016-08-26 – 2016-09-04 (×10): 15 mL via OROMUCOSAL

## 2016-08-24 MED ORDER — PROPOFOL 10 MG/ML IV BOLUS
INTRAVENOUS | Status: AC
  Filled 2016-08-24: qty 20

## 2016-08-24 MED ORDER — DOPAMINE-DEXTROSE 3.2-5 MG/ML-% IV SOLN
0.0000 ug/kg/min | INTRAVENOUS | Status: DC
  Filled 2016-08-24: qty 250

## 2016-08-24 MED ORDER — MIDAZOLAM HCL 5 MG/5ML IJ SOLN
INTRAMUSCULAR | Status: DC | PRN
  Administered 2016-08-24 (×2): 2 mg via INTRAVENOUS
  Administered 2016-08-24: 1 mg via INTRAVENOUS

## 2016-08-24 MED ORDER — CHLORHEXIDINE GLUCONATE 0.12 % MT SOLN
15.0000 mL | OROMUCOSAL | Status: AC
  Administered 2016-08-24: 15 mL via OROMUCOSAL
  Filled 2016-08-24: qty 15

## 2016-08-24 MED ORDER — PHENYLEPHRINE HCL 10 MG/ML IJ SOLN
INTRAMUSCULAR | Status: DC | PRN
  Administered 2016-08-24 (×3): 80 ug via INTRAVENOUS
  Administered 2016-08-24: 40 ug via INTRAVENOUS
  Administered 2016-08-24: 80 ug via INTRAVENOUS

## 2016-08-24 MED ORDER — OXYCODONE HCL 5 MG PO TABS
5.0000 mg | ORAL_TABLET | ORAL | Status: DC | PRN
  Administered 2016-08-25: 5 mg via ORAL
  Administered 2016-08-25 (×2): 10 mg via ORAL
  Administered 2016-08-25: 5 mg via ORAL
  Administered 2016-08-25 – 2016-09-03 (×7): 10 mg via ORAL
  Filled 2016-08-24 (×2): qty 2
  Filled 2016-08-24: qty 1
  Filled 2016-08-24 (×3): qty 2
  Filled 2016-08-24: qty 1
  Filled 2016-08-24 (×4): qty 2

## 2016-08-24 MED ORDER — PHENYLEPHRINE HCL 10 MG/ML IJ SOLN
0.0000 ug/min | INTRAVENOUS | Status: DC
  Filled 2016-08-24: qty 2

## 2016-08-24 MED ORDER — 0.9 % SODIUM CHLORIDE (POUR BTL) OPTIME
TOPICAL | Status: DC | PRN
  Administered 2016-08-24: 6000 mL

## 2016-08-24 MED ORDER — PROTAMINE SULFATE 10 MG/ML IV SOLN
INTRAVENOUS | Status: DC | PRN
  Administered 2016-08-24 (×2): 50 mg via INTRAVENOUS
  Administered 2016-08-24: 30 mg via INTRAVENOUS
  Administered 2016-08-24 (×2): 50 mg via INTRAVENOUS

## 2016-08-24 MED ORDER — LACTATED RINGERS IV SOLN: INTRAVENOUS | Status: DC | PRN

## 2016-08-24 MED ORDER — CHLORHEXIDINE GLUCONATE 0.12 % MT SOLN
15.0000 mL | Freq: Once | OROMUCOSAL | Status: AC
  Administered 2016-08-24: 15 mL via OROMUCOSAL
  Filled 2016-08-24: qty 15

## 2016-08-24 MED ORDER — LACTATED RINGERS IV SOLN
INTRAVENOUS | Status: DC | PRN
  Administered 2016-08-24 (×4): via INTRAVENOUS

## 2016-08-24 MED ORDER — ROCURONIUM BROMIDE 10 MG/ML (PF) SYRINGE
PREFILLED_SYRINGE | INTRAVENOUS | Status: AC
  Filled 2016-08-24: qty 10

## 2016-08-24 MED ORDER — FENTANYL CITRATE (PF) 250 MCG/5ML IJ SOLN
INTRAMUSCULAR | Status: AC
  Filled 2016-08-24: qty 5

## 2016-08-24 MED ORDER — HYDROCORTISONE NA SUCCINATE PF 100 MG IJ SOLR
INTRAMUSCULAR | Status: DC | PRN
  Administered 2016-08-24: 250 mg via INTRAVENOUS

## 2016-08-24 MED ORDER — TRAMADOL HCL 50 MG PO TABS
50.0000 mg | ORAL_TABLET | ORAL | Status: DC | PRN
  Administered 2016-08-25 – 2016-09-02 (×3): 50 mg via ORAL
  Filled 2016-08-24 (×3): qty 1

## 2016-08-24 MED ORDER — BISACODYL 5 MG PO TBEC
10.0000 mg | DELAYED_RELEASE_TABLET | Freq: Every day | ORAL | Status: DC
  Administered 2016-08-26 – 2016-09-02 (×8): 10 mg via ORAL
  Filled 2016-08-24 (×9): qty 2

## 2016-08-24 MED ORDER — SODIUM CHLORIDE 0.45 % IV SOLN
INTRAVENOUS | Status: DC | PRN
  Administered 2016-08-24: 14:00:00 via INTRAVENOUS

## 2016-08-24 MED ORDER — VANCOMYCIN HCL IN DEXTROSE 1-5 GM/200ML-% IV SOLN
1000.0000 mg | Freq: Once | INTRAVENOUS | Status: DC
  Filled 2016-08-24: qty 200

## 2016-08-24 MED ORDER — NOREPINEPHRINE BITARTRATE 1 MG/ML IV SOLN
0.0000 ug/min | INTRAVENOUS | Status: AC
  Administered 2016-08-24: 2 ug/min via INTRAVENOUS
  Filled 2016-08-24: qty 4

## 2016-08-24 MED ORDER — SODIUM CHLORIDE 0.9 % IV SOLN: 250.0000 mL | INTRAVENOUS | Status: DC

## 2016-08-24 MED ORDER — CEFUROXIME SODIUM 1.5 G IJ SOLR
1.5000 g | Freq: Two times a day (BID) | INTRAMUSCULAR | Status: AC
  Administered 2016-08-25 (×2): 1.5 g via INTRAVENOUS
  Filled 2016-08-24 (×2): qty 1.5

## 2016-08-24 MED ORDER — PROTAMINE SULFATE 10 MG/ML IV SOLN
INTRAVENOUS | Status: AC
  Filled 2016-08-24: qty 25

## 2016-08-24 MED ORDER — ROCURONIUM BROMIDE 10 MG/ML (PF) SYRINGE
PREFILLED_SYRINGE | INTRAVENOUS | Status: DC | PRN
Start: 2016-08-24 — End: 2016-08-24
  Administered 2016-08-24 (×4): 50 mg via INTRAVENOUS

## 2016-08-24 MED ORDER — ATORVASTATIN CALCIUM 80 MG PO TABS
80.0000 mg | ORAL_TABLET | Freq: Every day | ORAL | Status: DC
  Administered 2016-08-25 – 2016-09-03 (×10): 80 mg via ORAL
  Filled 2016-08-24 (×10): qty 1

## 2016-08-24 MED ORDER — LACTATED RINGERS IV SOLN: 500.0000 mL | Freq: Once | INTRAVENOUS | Status: DC | PRN

## 2016-08-24 MED ORDER — AMIODARONE HCL IN DEXTROSE 360-4.14 MG/200ML-% IV SOLN
60.0000 mg/h | INTRAVENOUS | Status: DC
Start: 2016-08-24 — End: 2016-08-24

## 2016-08-24 MED ORDER — CALCIUM CHLORIDE 10 % IV SOLN
INTRAVENOUS | Status: DC | PRN
  Administered 2016-08-24 (×3): 200 mg via INTRAVENOUS

## 2016-08-24 MED ORDER — CHLORHEXIDINE GLUCONATE 4 % EX LIQD: 30.0000 mL | CUTANEOUS | Status: DC

## 2016-08-24 MED ORDER — MILRINONE LACTATE IN DEXTROSE 20-5 MG/100ML-% IV SOLN
0.1250 ug/kg/min | INTRAVENOUS | Status: AC
  Administered 2016-08-24: .3 ug/kg/min via INTRAVENOUS
  Filled 2016-08-24: qty 100

## 2016-08-24 MED ORDER — DEXTROSE 5 % IV SOLN
5.0000 ug/min | INTRAVENOUS | Status: DC
  Filled 2016-08-24: qty 4

## 2016-08-24 MED ORDER — SODIUM CHLORIDE 0.9 % IV SOLN: INTRAVENOUS | Status: DC

## 2016-08-24 MED ORDER — MORPHINE SULFATE (PF) 2 MG/ML IV SOLN
1.0000 mg | INTRAVENOUS | Status: DC | PRN
  Administered 2016-08-24: 1 mg via INTRAVENOUS

## 2016-08-24 MED ORDER — METOPROLOL TARTRATE 12.5 MG HALF TABLET
12.5000 mg | ORAL_TABLET | Freq: Two times a day (BID) | ORAL | Status: DC
  Administered 2016-08-25 – 2016-09-04 (×17): 12.5 mg via ORAL
  Filled 2016-08-24 (×19): qty 1

## 2016-08-24 MED ORDER — MIDAZOLAM HCL 10 MG/2ML IJ SOLN
INTRAMUSCULAR | Status: AC
  Filled 2016-08-24: qty 2

## 2016-08-24 MED FILL — Magnesium Sulfate Inj 50%: INTRAMUSCULAR | Qty: 10 | Status: AC

## 2016-08-24 MED FILL — Potassium Chloride Inj 2 mEq/ML: INTRAVENOUS | Qty: 40 | Status: AC

## 2016-08-24 MED FILL — Heparin Sodium (Porcine) Inj 1000 Unit/ML: INTRAMUSCULAR | Qty: 30 | Status: AC

## 2016-08-24 SURGICAL SUPPLY — 106 items
ADAPTER CARDIO PERF ANTE/RETRO (ADAPTER) ×4 IMPLANT
ATRICLIP EXCLUSION 35 STD HAND (Clip) ×4 IMPLANT
BAG DECANTER FOR FLEXI CONT (MISCELLANEOUS) ×4 IMPLANT
BANDAGE ACE 4X5 VEL STRL LF (GAUZE/BANDAGES/DRESSINGS) ×4 IMPLANT
BANDAGE ACE 6X5 VEL STRL LF (GAUZE/BANDAGES/DRESSINGS) ×4 IMPLANT
BASKET HEART  (ORDER IN 25'S) (MISCELLANEOUS) ×1
BASKET HEART (ORDER IN 25'S) (MISCELLANEOUS) ×1
BASKET HEART (ORDER IN 25S) (MISCELLANEOUS) ×2 IMPLANT
BLADE STERNUM SYSTEM 6 (BLADE) ×4 IMPLANT
BLADE SURG 11 STRL SS (BLADE) ×4 IMPLANT
BLADE SURG 12 STRL SS (BLADE) ×4 IMPLANT
BLADE SURG ROTATE 9660 (MISCELLANEOUS) ×4 IMPLANT
BNDG GAUZE ELAST 4 BULKY (GAUZE/BANDAGES/DRESSINGS) ×4 IMPLANT
CANISTER SUCTION 2500CC (MISCELLANEOUS) ×4 IMPLANT
CANNULA GUNDRY RCSP 15FR (MISCELLANEOUS) ×4 IMPLANT
CATH CPB KIT VANTRIGT (MISCELLANEOUS) ×4 IMPLANT
CATH FOLEY LATEX FREE 16FR (CATHETERS) ×2
CATH FOLEY LF 16FR (CATHETERS) ×2 IMPLANT
CATH ROBINSON RED A/P 18FR (CATHETERS) ×12 IMPLANT
CATH THORACIC 36FR RT ANG (CATHETERS) ×4 IMPLANT
CLIP TI WIDE RED SMALL 24 (CLIP) ×8 IMPLANT
CRADLE DONUT ADULT HEAD (MISCELLANEOUS) ×4 IMPLANT
DRAIN CHANNEL 32F RND 10.7 FF (WOUND CARE) ×4 IMPLANT
DRAPE CARDIOVASCULAR INCISE (DRAPES) ×2
DRAPE SLUSH/WARMER DISC (DRAPES) ×4 IMPLANT
DRAPE SRG 135X102X78XABS (DRAPES) ×2 IMPLANT
DRSG AQUACEL AG ADV 3.5X14 (GAUZE/BANDAGES/DRESSINGS) ×4 IMPLANT
ELECT BLADE 4.0 EZ CLEAN MEGAD (MISCELLANEOUS) ×4
ELECT BLADE 6.5 EXT (BLADE) ×4 IMPLANT
ELECT CAUTERY BLADE 6.4 (BLADE) ×4 IMPLANT
ELECT REM PT RETURN 9FT ADLT (ELECTROSURGICAL) ×8
ELECTRODE BLDE 4.0 EZ CLN MEGD (MISCELLANEOUS) ×2 IMPLANT
ELECTRODE REM PT RTRN 9FT ADLT (ELECTROSURGICAL) ×4 IMPLANT
FELT TEFLON 1X6 (MISCELLANEOUS) ×8 IMPLANT
GAUZE SPONGE 4X4 12PLY STRL (GAUZE/BANDAGES/DRESSINGS) ×8 IMPLANT
GLOVE BIO SURGEON STRL SZ7.5 (GLOVE) ×12 IMPLANT
GLOVE SURG SS PI 6.5 STRL IVOR (GLOVE) ×32 IMPLANT
GOWN STRL REUS W/ TWL LRG LVL3 (GOWN DISPOSABLE) ×8 IMPLANT
GOWN STRL REUS W/TWL LRG LVL3 (GOWN DISPOSABLE) ×8
HEMOSTAT POWDER SURGIFOAM 1G (HEMOSTASIS) ×12 IMPLANT
HEMOSTAT SURGICEL 2X14 (HEMOSTASIS) ×4 IMPLANT
INSERT FOGARTY XLG (MISCELLANEOUS) IMPLANT
IV CATH 22GX1 FEP (IV SOLUTION) ×4 IMPLANT
KIT BASIN OR (CUSTOM PROCEDURE TRAY) ×4 IMPLANT
KIT ROOM TURNOVER OR (KITS) ×4 IMPLANT
KIT SUCTION CATH 14FR (SUCTIONS) ×4 IMPLANT
KIT VASOVIEW HEMOPRO VH 3000 (KITS) ×4 IMPLANT
LEAD PACING MYOCARDI (MISCELLANEOUS) ×4 IMPLANT
MARKER GRAFT CORONARY BYPASS (MISCELLANEOUS) ×12 IMPLANT
NS IRRIG 1000ML POUR BTL (IV SOLUTION) ×24 IMPLANT
PACK OPEN HEART (CUSTOM PROCEDURE TRAY) ×4 IMPLANT
PAD ARMBOARD 7.5X6 YLW CONV (MISCELLANEOUS) ×8 IMPLANT
PAD ELECT DEFIB RADIOL ZOLL (MISCELLANEOUS) ×4 IMPLANT
PENCIL BUTTON HOLSTER BLD 10FT (ELECTRODE) ×4 IMPLANT
PUNCH AORTIC ROTATE  4.5MM 8IN (MISCELLANEOUS) ×4 IMPLANT
PUNCH AORTIC ROTATE 4.0MM (MISCELLANEOUS) IMPLANT
PUNCH AORTIC ROTATE 4.5MM 8IN (MISCELLANEOUS) IMPLANT
PUNCH AORTIC ROTATE 5MM 8IN (MISCELLANEOUS) IMPLANT
SET CARDIOPLEGIA MPS 5001102 (MISCELLANEOUS) ×4 IMPLANT
SPONGE GAUZE 4X4 12PLY STER LF (GAUZE/BANDAGES/DRESSINGS) ×8 IMPLANT
SPONGE LAP 18X18 X RAY DECT (DISPOSABLE) ×4 IMPLANT
SPONGE LAP 4X18 X RAY DECT (DISPOSABLE) ×4 IMPLANT
STAPLER VISISTAT 35W (STAPLE) ×4 IMPLANT
SURGIFLO W/THROMBIN 8M KIT (HEMOSTASIS) ×4 IMPLANT
SUT BONE WAX W31G (SUTURE) ×4 IMPLANT
SUT ETHIBOND 2 0 SH (SUTURE) ×8
SUT ETHIBOND 2 0 SH 36X2 (SUTURE) ×8 IMPLANT
SUT MNCRL AB 4-0 PS2 18 (SUTURE) ×16 IMPLANT
SUT PROLENE 3 0 SH DA (SUTURE) IMPLANT
SUT PROLENE 3 0 SH1 36 (SUTURE) IMPLANT
SUT PROLENE 4 0 RB 1 (SUTURE) ×4
SUT PROLENE 4 0 SH DA (SUTURE) ×4 IMPLANT
SUT PROLENE 4-0 RB1 .5 CRCL 36 (SUTURE) ×4 IMPLANT
SUT PROLENE 5 0 C 1 36 (SUTURE) ×8 IMPLANT
SUT PROLENE 6 0 C 1 30 (SUTURE) ×8 IMPLANT
SUT PROLENE 6 0 CC (SUTURE) ×20 IMPLANT
SUT PROLENE 8 0 BV175 6 (SUTURE) ×16 IMPLANT
SUT PROLENE BLUE 7 0 (SUTURE) ×8 IMPLANT
SUT SILK  1 MH (SUTURE) ×6
SUT SILK 1 MH (SUTURE) ×6 IMPLANT
SUT SILK 1 TIES 10X30 (SUTURE) ×4 IMPLANT
SUT SILK 2 0 SH CR/8 (SUTURE) ×8 IMPLANT
SUT SILK 2 0 TIES 17X18 (SUTURE) ×2
SUT SILK 2-0 18XBRD TIE BLK (SUTURE) ×2 IMPLANT
SUT SILK 3 0 SH CR/8 (SUTURE) ×4 IMPLANT
SUT SILK 4 0 TIE 10X30 (SUTURE) ×8 IMPLANT
SUT STEEL 6MS V (SUTURE) ×8 IMPLANT
SUT STEEL SZ 6 DBL 3X14 BALL (SUTURE) ×8 IMPLANT
SUT TEM PAC WIRE 2 0 SH (SUTURE) ×16 IMPLANT
SUT VIC AB 1 CTX 36 (SUTURE) ×4
SUT VIC AB 1 CTX36XBRD ANBCTR (SUTURE) ×4 IMPLANT
SUT VIC AB 2-0 CT1 27 (SUTURE) ×8
SUT VIC AB 2-0 CT1 TAPERPNT 27 (SUTURE) ×8 IMPLANT
SUT VIC AB 2-0 CTX 27 (SUTURE) ×8 IMPLANT
SUT VIC AB 3-0 X1 27 (SUTURE) ×8 IMPLANT
SUTURE E-PAK OPEN HEART (SUTURE) ×4 IMPLANT
SYR 5ML LUER SLIP (SYRINGE) ×4 IMPLANT
SYSTEM SAHARA CHEST DRAIN ATS (WOUND CARE) ×4 IMPLANT
TAPE CLOTH SURG 4X10 WHT LF (GAUZE/BANDAGES/DRESSINGS) ×4 IMPLANT
TAPE PAPER 2X10 WHT MICROPORE (GAUZE/BANDAGES/DRESSINGS) ×4 IMPLANT
TOWEL OR 17X24 6PK STRL BLUE (TOWEL DISPOSABLE) ×8 IMPLANT
TOWEL OR 17X26 10 PK STRL BLUE (TOWEL DISPOSABLE) ×8 IMPLANT
TRAY FOLEY IC TEMP SENS 16FR (CATHETERS) ×4 IMPLANT
TUBING INSUFFLATION (TUBING) ×4 IMPLANT
UNDERPAD 30X30 (UNDERPADS AND DIAPERS) ×4 IMPLANT
WATER STERILE IRR 1000ML POUR (IV SOLUTION) ×8 IMPLANT

## 2016-08-24 NOTE — Anesthesia Procedure Notes (Signed)
Procedure Name: Intubation Date/Time: 08/24/2016 7:56 AM Performed by: Valda Favia Pre-anesthesia Checklist: Patient identified, Emergency Drugs available, Suction available and Patient being monitored Patient Re-evaluated:Patient Re-evaluated prior to inductionOxygen Delivery Method: Circle System Utilized Preoxygenation: Pre-oxygenation with 100% oxygen Intubation Type: IV induction Ventilation: Oral airway inserted - appropriate to patient size and Mask ventilation without difficulty Laryngoscope Size: Mac and 3 Grade View: Grade I Tube type: Oral Tube size: 8.0 mm Number of attempts: 1 Airway Equipment and Method: Stylet and Oral airway Placement Confirmation: ETT inserted through vocal cords under direct vision,  positive ETCO2 and breath sounds checked- equal and bilateral Secured at: 22 cm Tube secured with: Tape Dental Injury: Teeth and Oropharynx as per pre-operative assessment

## 2016-08-24 NOTE — OR Nursing (Signed)
12:30 - 45 minute call to SICU charge nurse, 13:05 - 20 minute call to SICU

## 2016-08-24 NOTE — Progress Notes (Signed)
  Echocardiogram Echocardiogram Transesophageal has been performed.  [name] R 08/24/2016, 8:16 AM

## 2016-08-24 NOTE — Brief Op Note (Signed)
08/24/2016  12:17 PM  PATIENT:  [name]  55 y.o. male  PRE-OPERATIVE DIAGNOSIS:  CAD  POST-OPERATIVE DIAGNOSIS:  CAD  PROCEDURE: TRANSESOPHAGEAL ECHOCARDIOGRAM (TEE), MEDIAN STERNOTOMY for CORONARY ARTERY BYPASS GRAFTING (CABG) x 3 (LIAM to LAD, SVG to DIAGONAL, SVG to RAMUS INTERMEDIATE) using left internal mammary artery and right leg greater saphenous vein harvested endoscopically/open  SURGEON:  Surgeon(s) and Role:    * Ivin Poot, MD - Primary  PHYSICIAN ASSISTANT: Ashley Murrain PA-C  ANESTHESIA:   general  EBL:  Total I/O In: -  Out: 800 [Urine:800]  BLOOD ADMINISTERED:Two FFP  DRAINS: Chest tubes placed in the mediastinal and pleural spaces   COUNTS CORRECTION:  YES  DICTATION: .Dragon Dictation  PLAN OF CARE: Admit to inpatient   PATIENT DISPOSITION:  ICU - intubated and hemodynamically stable.   Delay start of Pharmacological VTE agent (>24hrs) due to surgical blood loss or risk of bleeding: yes  BASELINE WEIGHT: 70.8 kg

## 2016-08-24 NOTE — Procedures (Signed)
Extubation Procedure Note  Patient Details:   Name: [name] DOB: 1961-04-16 MRN: [id number]   Airway Documentation:     Evaluation  O2 sats: stable throughout Complications: No apparent complications Patient did tolerate procedure well. Bilateral Breath Sounds: Clear   Yes   Patient extubated to 2L nasal cannula.  NIF of -24, VC of 1L.  Positive cuff leak noted.  No evidence of stridor.  Patient able to speak post extubation.  Sats currently 97%.  Vitals are stable.  Incentive spirometry performed x5 with achieved goal of 500.  No complications noted.     Philomena Doheny 08/24/2016, 5:44 PM

## 2016-08-24 NOTE — Anesthesia Postprocedure Evaluation (Signed)
Anesthesia Post Note  Patient: [name]  Procedure(s) Performed: Procedure(s) (LRB): CORONARY ARTERY BYPASS GRAFTING (CABG) x three,  using left internal mammary artery and right leg greater saphenous vein harvested endoscopically (N/A) TRANSESOPHAGEAL ECHOCARDIOGRAM (TEE) (N/A) CLIPPING OF ATRIAL APPENDAGE (N/A)  Patient location during evaluation: SICU Anesthesia Type: General Level of consciousness: patient remains intubated per anesthesia plan Vital Signs Assessment: post-procedure vital signs reviewed and stable Cardiovascular status: blood pressure returned to baseline Anesthetic complications: no    Last Vitals:  Vitals:   08/24/16 1410 08/24/16 1525  BP: (!) 183/80 (!) 118/53  Pulse: 99 96  Resp: 12 17  Temp:      Last Pain:  Vitals:   08/24/16 0553  TempSrc: Oral                 Lanay Zinda COKER

## 2016-08-24 NOTE — Transfer of Care (Signed)
Immediate Anesthesia Transfer of Care Note  Patient: [name]  Procedure(s) Performed: Procedure(s): CORONARY ARTERY BYPASS GRAFTING (CABG) x three,  using left internal mammary artery and right leg greater saphenous vein harvested endoscopically (N/A) TRANSESOPHAGEAL ECHOCARDIOGRAM (TEE) (N/A) CLIPPING OF ATRIAL APPENDAGE (N/A)  Patient Location: SICU  Anesthesia Type:General  Level of Consciousness: Patient remains intubated per anesthesia plan  Airway & Oxygen Therapy: Patient remains intubated per anesthesia plan and Patient placed on Ventilator (see vital sign flow sheet for setting)  Post-op Assessment: Report given to RN and Post -op Vital signs reviewed and stable  Post vital signs: Reviewed and stable  Last Vitals:  Vitals:   08/24/16 0553 08/24/16 1410  BP: 130/85 (!) 183/80  Pulse: 81 99  Resp: 20 12  Temp: 36.9 C     Last Pain:  Vitals:   08/24/16 0553  TempSrc: Oral      Patients Stated Pain Goal: 2 ([id number])  Complications: No apparent anesthesia complications

## 2016-08-24 NOTE — Addendum Note (Signed)
Addendum  created 08/24/16 1855 by Roberts Gaudy, MD   Order list changed

## 2016-08-24 NOTE — Progress Notes (Signed)
The patient was examined and preop studies reviewed. There has been no change from the prior exam and the patient is ready for surgery.  plan CABG on [name] today

## 2016-08-24 NOTE — Anesthesia Procedure Notes (Addendum)
Central Venous Catheter Insertion Performed by: anesthesiologist Patient location: Pre-op. Preanesthetic checklist: patient identified, IV checked, site marked, risks and benefits discussed, surgical consent, monitors and equipment checked, pre-op evaluation, timeout performed and anesthesia consent Position: Trendelenburg Lidocaine 1% used for infiltration Landmarks identified and Seldinger technique used Catheter size: 8.5 Fr Central line was placed.Sheath introducer Swan type and PA catheter depth:thermodilution and 50PA Cath depth:50 Procedure performed using ultrasound guided technique. Attempts: 1 Following insertion, line sutured and dressing applied. Post procedure assessment: blood return through all ports, free fluid flow and no air. Patient tolerated the procedure well with no immediate complications.

## 2016-08-24 NOTE — Progress Notes (Signed)
Rapid wean protocol began at 1640 with dropping rate to 4 and FIO2 to 40%.

## 2016-08-24 NOTE — Progress Notes (Signed)
Patient ID: HINSON VLASIC, male   DOB: 05/28/1961, 55 y.o.   MRN: [id number] EVENING ROUNDS NOTE :     [address]       [place] [address]             [phone]                 Day of Surgery Procedure(s) (LRB): CORONARY ARTERY BYPASS GRAFTING (CABG) x three,  using left internal mammary artery and right leg greater saphenous vein harvested endoscopically (N/A) TRANSESOPHAGEAL ECHOCARDIOGRAM (TEE) (N/A) CLIPPING OF ATRIAL APPENDAGE (N/A)  Total Length of Stay:  LOS: 0 days  BP (!) 118/53   Pulse 96   Temp 98.4 F (36.9 C) (Oral)   Resp 17   Ht 5\' 6"  (1.676 m)   Wt 156 lb (70.8 kg)   SpO2 100%   BMI 25.18 kg/m   .Intake/Output      11/05 0701 - 11/06 0700 11/06 0701 - 11/07 0700   I.V. (mL/kg)  2900 (41)   Blood  884   IV Piggyback  250   Total Intake(mL/kg)  4034 (57)   Urine (mL/kg/hr)  1300 (1.6)   Blood  700 (0.8)   Total Output   2000   Net   +2034          . sodium chloride 20 mL/hr at 08/24/16 1400  . [START ON 08/25/2016] sodium chloride    . sodium chloride 20 mL/hr at 08/24/16 1400  . amiodarone 60 mg/hr (08/24/16 1638)  . amiodarone    . dexmedetomidine 0.7 mcg/kg/hr (08/24/16 1400)  . DOPamine 3 mcg/kg/min (08/24/16 1400)  . epinephrine 2 mcg/min (08/24/16 1400)  . insulin (NOVOLIN-R) infusion 1.6 Units/hr (08/24/16 1400)  . lactated ringers 20 mL/hr at 08/24/16 1400  . lactated ringers 20 mL/hr at 08/24/16 1400  . milrinone 0.3 mcg/kg/min (08/24/16 1400)  . nitroGLYCERIN 0 mcg/min (08/24/16 1400)  . norepinephrine (LEVOPHED) Adult infusion 5 mcg/min (08/24/16 1400)  . phenylephrine (NEO-SYNEPHRINE) Adult infusion 0 mcg/min (08/24/16 1400)     Lab Results  Component Value Date   WBC 15.4 (H) 08/24/2016   HGB 11.1 (L) 08/24/2016   HGB 11.2 (L) 08/24/2016   HCT 32.5 (L) 08/24/2016   HCT 33.0 (L) 08/24/2016   PLT 142 (L) 08/24/2016   GLUCOSE 208 (H) 08/24/2016   CHOL 185 05/23/2013   TRIG 146.0 05/23/2013   HDL 32.80 (L)  05/23/2013   LDLDIRECT 169.5 08/23/2012   LDLCALC 123 (H) 05/23/2013   ALT 26 08/20/2016   AST 27 08/20/2016   NA 142 08/24/2016   K 3.0 (L) 08/24/2016   CL 96 (L) 08/24/2016   CREATININE 0.90 08/24/2016   BUN 33 (H) 08/24/2016   CO2 26 08/20/2016   INR 1.30 08/24/2016   HGBA1C 8.2 (H) 08/20/2016   Early post op Not bleeding Extubated and neuro inatct  Grace Isaac MD  Beeper [phone] Office [phone] 08/24/2016 6:45 PM

## 2016-08-24 NOTE — Anesthesia Preprocedure Evaluation (Addendum)
Anesthesia Evaluation  Patient identified by MRN, date of birth, ID band Patient awake    Reviewed: Allergy & Precautions, NPO status , Patient's Chart, lab work & pertinent test results  Airway Mallampati: II  TM Distance: >3 FB     Dental  (+) Teeth Intact   Pulmonary former smoker,    breath sounds clear to auscultation       Cardiovascular hypertension,  Rhythm:Regular Rate:Normal     Neuro/Psych    GI/Hepatic   Endo/Other  diabetes  Renal/GU      Musculoskeletal   Abdominal   Peds  Hematology   Anesthesia Other Findings   Reproductive/Obstetrics                            Anesthesia Physical Anesthesia Plan  ASA: IV  Anesthesia Plan: General   Post-op Pain Management:    Induction: Intravenous  Airway Management Planned: Oral ETT  Additional Equipment: CVP, PA Cath, 3D TEE and Arterial line  Intra-op Plan:   Post-operative Plan: Post-operative intubation/ventilation  Informed Consent: I have reviewed the patients History and Physical, chart, labs and discussed the procedure including the risks, benefits and alternatives for the proposed anesthesia with the patient or authorized representative who has indicated his/her understanding and acceptance.   Dental advisory given  Plan Discussed with: CRNA and Anesthesiologist  Anesthesia Plan Comments:        Anesthesia Quick Evaluation

## 2016-08-25 ENCOUNTER — Encounter (HOSPITAL_COMMUNITY): Payer: Self-pay | Admitting: Cardiothoracic Surgery

## 2016-08-25 ENCOUNTER — Inpatient Hospital Stay (HOSPITAL_COMMUNITY): Payer: BLUE CROSS/BLUE SHIELD

## 2016-08-25 ENCOUNTER — Other Ambulatory Visit: Payer: Self-pay | Admitting: *Deleted

## 2016-08-25 LAB — GLUCOSE, CAPILLARY
Glucose-Capillary: 105 mg/dL — ABNORMAL HIGH (ref 65–99)
Glucose-Capillary: 106 mg/dL — ABNORMAL HIGH (ref 65–99)
Glucose-Capillary: 110 mg/dL — ABNORMAL HIGH (ref 65–99)
Glucose-Capillary: 117 mg/dL — ABNORMAL HIGH (ref 65–99)
Glucose-Capillary: 118 mg/dL — ABNORMAL HIGH (ref 65–99)
Glucose-Capillary: 125 mg/dL — ABNORMAL HIGH (ref 65–99)
Glucose-Capillary: 125 mg/dL — ABNORMAL HIGH (ref 65–99)
Glucose-Capillary: 131 mg/dL — ABNORMAL HIGH (ref 65–99)
Glucose-Capillary: 133 mg/dL — ABNORMAL HIGH (ref 65–99)
Glucose-Capillary: 134 mg/dL — ABNORMAL HIGH (ref 65–99)
Glucose-Capillary: 144 mg/dL — ABNORMAL HIGH (ref 65–99)
Glucose-Capillary: 148 mg/dL — ABNORMAL HIGH (ref 65–99)
Glucose-Capillary: 153 mg/dL — ABNORMAL HIGH (ref 65–99)
Glucose-Capillary: 170 mg/dL — ABNORMAL HIGH (ref 65–99)
Glucose-Capillary: 236 mg/dL — ABNORMAL HIGH (ref 65–99)
Glucose-Capillary: 277 mg/dL — ABNORMAL HIGH (ref 65–99)
Glucose-Capillary: 83 mg/dL (ref 65–99)

## 2016-08-25 LAB — MAGNESIUM
Magnesium: 2.3 mg/dL (ref 1.7–2.4)
Magnesium: 1.8 mg/dL (ref 1.7–2.4)

## 2016-08-25 LAB — PREPARE FRESH FROZEN PLASMA
UNIT DIVISION: 0
Unit division: 0

## 2016-08-25 LAB — BASIC METABOLIC PANEL
Anion gap: 10 (ref 5–15)
BUN: 18 mg/dL (ref 6–20)
CALCIUM: 8 mg/dL — AB (ref 8.9–10.3)
CO2: 25 mmol/L (ref 22–32)
Chloride: 106 mmol/L (ref 101–111)
Creatinine, Ser: 1.1 mg/dL (ref 0.61–1.24)
GFR calc Af Amer: >60 mL/min (ref >60)
GLUCOSE: 77 mg/dL (ref 65–99)
Potassium: 4.3 mmol/L (ref 3.5–5.1)
Sodium: 141 mmol/L (ref 135–145)

## 2016-08-25 LAB — POCT I-STAT 3, ART BLOOD GAS (G3+)
Acid-base deficit: 3 mmol/L — ABNORMAL HIGH (ref 0.0–2.0)
Bicarbonate: 23 mmol/L (ref 20.0–28.0)
O2 Saturation: 92 %
Patient temperature: 98.5
TCO2: 24 mmol/L (ref 0–100)
pCO2 arterial: 44.1 mmHg (ref 32.0–48.0)
pH, Arterial: 7.324 — ABNORMAL LOW (ref 7.350–7.450)
pO2, Arterial: 67 mmHg — ABNORMAL LOW (ref 83.0–108.0)

## 2016-08-25 LAB — CBC
HCT: 28.7 % — ABNORMAL LOW (ref 39.0–52.0)
HCT: 28.8 % — ABNORMAL LOW (ref 39.0–52.0)
HEMOGLOBIN: 9.7 g/dL — AB (ref 13.0–17.0)
Hemoglobin: 9.8 g/dL — ABNORMAL LOW (ref 13.0–17.0)
MCH: 28.2 pg (ref 26.0–34.0)
MCH: 28.1 pg (ref 26.0–34.0)
MCHC: 34.1 g/dL (ref 30.0–36.0)
MCHC: 33.7 g/dL (ref 30.0–36.0)
MCV: 82.5 fL (ref 78.0–100.0)
MCV: 83.5 fL (ref 78.0–100.0)
PLATELETS: 109 10*3/uL — AB (ref 150–400)
Platelets: 106 10*3/uL — ABNORMAL LOW (ref 150–400)
RBC: 3.48 MIL/uL — ABNORMAL LOW (ref 4.22–5.81)
RBC: 3.45 MIL/uL — ABNORMAL LOW (ref 4.22–5.81)
RDW: 14.4 % (ref 11.5–15.5)
RDW: 14.6 % (ref 11.5–15.5)
WBC: 11 10*3/uL — ABNORMAL HIGH (ref 4.0–10.5)
WBC: 14.7 10*3/uL — ABNORMAL HIGH (ref 4.0–10.5)

## 2016-08-25 LAB — CARBOXYHEMOGLOBIN - COOX: Carboxyhemoglobin: 0.9 % (ref 0.5–1.5)

## 2016-08-25 LAB — BLOOD GAS, ARTERIAL
Acid-base deficit: 0.1 mmol/L (ref 0.0–2.0)
Bicarbonate: 24.7 mmol/L (ref 20.0–28.0)
Drawn by: 437071
O2 Content: 1.5 L/min
O2 Saturation: 91.5 %
Patient temperature: 97.8
pCO2 arterial: 44.1 mmHg (ref 32.0–48.0)
pH, Arterial: 7.364 (ref 7.350–7.450)
pO2, Arterial: 63.9 mmHg — ABNORMAL LOW (ref 83.0–108.0)

## 2016-08-25 LAB — CREATININE, SERUM
CREATININE: 1.18 mg/dL (ref 0.61–1.24)
GFR calc Af Amer: >60 mL/min (ref >60)
GFR calc non Af Amer: >60 mL/min (ref >60)

## 2016-08-25 LAB — POCT I-STAT, CHEM 8
BUN: 17 mg/dL (ref 6–20)
Calcium, Ion: 1.05 mmol/L — ABNORMAL LOW (ref 1.15–1.40)
Chloride: 97 mmol/L — ABNORMAL LOW (ref 101–111)
Creatinine, Ser: 1 mg/dL (ref 0.61–1.24)
Glucose, Bld: 228 mg/dL — ABNORMAL HIGH (ref 65–99)
HCT: 29 % — ABNORMAL LOW (ref 39.0–52.0)
Hemoglobin: 9.9 g/dL — ABNORMAL LOW (ref 13.0–17.0)
Potassium: 4 mmol/L (ref 3.5–5.1)
Sodium: 135 mmol/L (ref 135–145)
TCO2: 24 mmol/L (ref 0–100)

## 2016-08-25 MED ORDER — AMIODARONE HCL 200 MG PO TABS
400.0000 mg | ORAL_TABLET | Freq: Two times a day (BID) | ORAL | Status: DC
  Administered 2016-08-25 – 2016-08-26 (×4): 400 mg via ORAL
  Filled 2016-08-25 (×4): qty 2

## 2016-08-25 MED ORDER — INSULIN DETEMIR 100 UNIT/ML ~~LOC~~ SOLN
12.0000 [IU] | Freq: Two times a day (BID) | SUBCUTANEOUS | Status: DC
  Administered 2016-08-25 (×2): 12 [IU] via SUBCUTANEOUS
  Filled 2016-08-25 (×3): qty 0.12

## 2016-08-25 MED ORDER — DOPAMINE-DEXTROSE 3.2-5 MG/ML-% IV SOLN: 2.5000 ug/kg/min | INTRAVENOUS | Status: DC

## 2016-08-25 MED ORDER — WARFARIN SODIUM 2 MG PO TABS
2.0000 mg | ORAL_TABLET | Freq: Every day | ORAL | Status: DC
  Administered 2016-08-25 – 2016-08-27 (×3): 2 mg via ORAL
  Filled 2016-08-25 (×3): qty 1

## 2016-08-25 MED ORDER — WARFARIN - PHYSICIAN DOSING INPATIENT
Freq: Every day | Status: DC
  Administered 2016-08-26: 18:00:00

## 2016-08-25 MED ORDER — FUROSEMIDE 10 MG/ML IJ SOLN
20.0000 mg | Freq: Two times a day (BID) | INTRAMUSCULAR | Status: DC
  Administered 2016-08-25 – 2016-08-26 (×3): 20 mg via INTRAVENOUS
  Filled 2016-08-25 (×2): qty 2

## 2016-08-25 MED ORDER — NOREPINEPHRINE BITARTRATE 1 MG/ML IV SOLN
4.0000 ug/min | INTRAVENOUS | Status: DC
  Administered 2016-08-25 – 2016-08-27 (×4): 4 ug/min via INTRAVENOUS
  Filled 2016-08-25 (×4): qty 4

## 2016-08-25 MED ORDER — INSULIN ASPART 100 UNIT/ML ~~LOC~~ SOLN
0.0000 [IU] | SUBCUTANEOUS | Status: DC
  Administered 2016-08-25: 12 [IU] via SUBCUTANEOUS
  Administered 2016-08-25: 4 [IU] via SUBCUTANEOUS
  Administered 2016-08-25: 8 [IU] via SUBCUTANEOUS
  Administered 2016-08-25: 2 [IU] via SUBCUTANEOUS
  Administered 2016-08-26: 12 [IU] via SUBCUTANEOUS
  Administered 2016-08-26 (×2): 8 [IU] via SUBCUTANEOUS
  Administered 2016-08-26: 12 [IU] via SUBCUTANEOUS
  Administered 2016-08-26: 8 [IU] via SUBCUTANEOUS
  Administered 2016-08-27: 2 [IU] via SUBCUTANEOUS
  Administered 2016-08-27: 8 [IU] via SUBCUTANEOUS
  Administered 2016-08-27 (×2): 4 [IU] via SUBCUTANEOUS
  Administered 2016-08-27 (×2): 8 [IU] via SUBCUTANEOUS
  Administered 2016-08-27: 2 [IU] via SUBCUTANEOUS
  Administered 2016-08-28: 4 [IU] via SUBCUTANEOUS
  Administered 2016-08-28 (×2): 8 [IU] via SUBCUTANEOUS
  Administered 2016-08-28: 4 [IU] via SUBCUTANEOUS
  Administered 2016-08-28 – 2016-08-29 (×2): 2 [IU] via SUBCUTANEOUS
  Administered 2016-08-29: 4 [IU] via SUBCUTANEOUS

## 2016-08-25 MED FILL — Sodium Chloride IV Soln 0.9%: INTRAVENOUS | Qty: 2000 | Status: AC

## 2016-08-25 MED FILL — Electrolyte-R (PH 7.4) Solution: INTRAVENOUS | Qty: 4000 | Status: AC

## 2016-08-25 MED FILL — Lidocaine HCl IV Inj 20 MG/ML: INTRAVENOUS | Qty: 5 | Status: AC

## 2016-08-25 MED FILL — Heparin Sodium (Porcine) Inj 1000 Unit/ML: INTRAMUSCULAR | Qty: 30 | Status: AC

## 2016-08-25 MED FILL — Mannitol IV Soln 20%: INTRAVENOUS | Qty: 500 | Status: AC

## 2016-08-25 MED FILL — Sodium Bicarbonate IV Soln 8.4%: INTRAVENOUS | Qty: 50 | Status: AC

## 2016-08-25 NOTE — Telephone Encounter (Signed)
Pt in hospital       

## 2016-08-25 NOTE — Progress Notes (Signed)
Inpatient Diabetes Program Recommendations  AACE/ADA: New Consensus Statement on Inpatient Glycemic Control (2015)  Target Ranges:  Prepandial:   less than 140 mg/dL      Peak postprandial:   less than 180 mg/dL (1-2 hours)      Critically ill patients:  140 - 180 mg/dL   Lab Results  Component Value Date   GLUCAP 133 (H) 08/25/2016   HGBA1C 8.2 (H) 08/20/2016    Review of Glycemic Control  Diabetes history: DM2 Outpatient Diabetes medications: metformin 1000 mg bid, glipizide 10 mg bid Current orders for Inpatient glycemic control: GlucoStabilizer    Inpatient Diabetes Program Recommendations:   Please consider Lantus 15 units qhs - give 2 hours prior to discontinuation of drip. When pt begins diet, begin Novolog 3 units tidwc Novolog 0-24 Q4H when transitioning off drip.  Will talk with pt regarding starting insulin at home when transferred to floor.   Thank you. Lorenda Peck, RD, LDN, CDE Inpatient Diabetes Coordinator [phone]

## 2016-08-25 NOTE — Care Management Note (Signed)
Case Management Note  Patient Details  Name: [name] MRN: [id number] Date of Birth: 05/17/1961  Subjective/Objective:   Pt lives with his mother and his daughter, states one of them will be available for assistance if needed 24/7.  Was independent PTA and has needed no adaptive equipment.                          Expected Discharge Plan:  Home/Self Care  Discharge planning Services  CM Consult  Status of Service:  In process, will continue to follow  Girard Cooter, RN 08/25/2016, 11:21 AM

## 2016-08-25 NOTE — Progress Notes (Signed)
[facility] BRIEF SICU PROGRESS NOTE  1 Day Post-Op  S/P Procedure(s) (LRB): CORONARY ARTERY BYPASS GRAFTING (CABG) x three,  using left internal mammary artery and right leg greater saphenous vein harvested endoscopically (N/A) TRANSESOPHAGEAL ECHOCARDIOGRAM (TEE) (N/A) CLIPPING OF ATRIAL APPENDAGE (N/A)   Stable day Sinus tach w/ stable BP on dopamine, levophed and milrinone Breathing comfortably w/ O2 sats 92-96% on 2 L/min UOP adequate Labs okay  Plan: Continue current plan  Rexene Alberts, MD 08/25/2016 6:27 PM

## 2016-08-25 NOTE — Progress Notes (Signed)
1 Day Post-Op Procedure(s) (LRB): CORONARY ARTERY BYPASS GRAFTING (CABG) x three,  using left internal mammary artery and right leg greater saphenous vein harvested endoscopically (N/A) TRANSESOPHAGEAL ECHOCARDIOGRAM (TEE) (N/A) CLIPPING OF ATRIAL APPENDAGE (N/A) Subjective: cabg x3 , ischemic cardiomyopathy EF 25 Extubated with fluid overload nsr Objective: Vital signs in last 24 hours: Temp:  [96.4 F (35.8 C)-99.9 F (37.7 C)] 97.9 F (36.6 C) (11/07 0700) Pulse Rate:  [88-101] 97 (11/07 0700) Cardiac Rhythm: Normal sinus rhythm (11/07 0700) Resp:  [8-29] 13 (11/07 0700) BP: (87-183)/(48-84) 97/63 (11/07 0700) SpO2:  [83 %-100 %] 96 % (11/07 0700) FiO2 (%):  [40 %-80 %] 40 % (11/06 1707) Weight:  [175 lb 11.3 oz (79.7 kg)] 175 lb 11.3 oz (79.7 kg) (11/07 0515)  Hemodynamic parameters for last 24 hours: PAP: (25-46)/(10-27) 26/15 CO:  [5.2 L/min-7.2 L/min] 5.5 L/min CI:  [2.9 L/min/m2-4 L/min/m2] 3.1 L/min/m2  Intake/Output from previous day: 11/06 0701 - 11/07 0700 In: 6313 [P.O.:30; I.V.:4449; MK:[id number]; IV Piggyback:950] Out: 4400 [Urine:3010; Drains:200; Blood:700; Chest Tube:240] Intake/Output this shift: No intake/output data recorded.       Exam    General- alert and comfortable   Lungs- clear without rales, wheezes   Cor- regular rate and rhythm, no murmur , gallop   Abdomen- soft, non-tender   Extremities - warm, non-tender, minimal edema   Neuro- oriented, appropriate, no focal weakness.  Lab Results:  Recent Labs  08/24/16 2000 08/24/16 2014 08/25/16 0415  WBC 14.0*  --  11.0*  HGB 10.7* 10.9* 9.8*  HCT 31.5* 32.0* 28.7*  PLT 147*  --  109*   BMET:  Recent Labs  08/24/16 2014 08/25/16 0415  NA 143 141  K 3.8 4.3  CL 103 106  CO2  --  25  GLUCOSE 196* 77  BUN 19 18  CREATININE 1.10 1.10  CALCIUM  --  8.0*    PT/INR:  Recent Labs  08/24/16 1415  LABPROT 16.2*  INR 1.30   ABG    Component Value Date/Time   PHART 7.364  08/25/2016 0435   HCO3 24.7 08/25/2016 0435   TCO2 22 08/24/2016 2014   ACIDBASEDEF 0.1 08/25/2016 0435   O2SAT 91.5 08/25/2016 0435   CBG (last 3)   Recent Labs  08/25/16 0431 08/25/16 0536 08/25/16 0636  GLUCAP 131* 118* 117*    Assessment/Plan: S/P Procedure(s) (LRB): CORONARY ARTERY BYPASS GRAFTING (CABG) x three,  using left internal mammary artery and right leg greater saphenous vein harvested endoscopically (N/A) TRANSESOPHAGEAL ECHOCARDIOGRAM (TEE) (N/A) CLIPPING OF ATRIAL APPENDAGE (N/A) Mobilize Diuresis Diabetes control d/c tubes/lines See progression orders start coumadin for hx afib   LOS: 1 day    [name] 08/25/2016

## 2016-08-25 NOTE — Op Note (Signed)
NAME:  KERMIT, RACHAL NO.:  [id number]  MEDICAL RECORD NO.:  [id number]  LOCATION:  2S06C                        FACILITY:  [facility]  PHYSICIAN:  Ivin Poot, M.D.  DATE OF BIRTH:  [birth year]  DATE OF PROCEDURE:  08/24/2016 DATE OF DISCHARGE:                              OPERATIVE REPORT   OPERATION: 1. Coronary artery bypass grafting x3 (left internal mammary artery to     LAD, saphenous vein graft to ramus intermediate, saphenous vein     graft to diagonal). 2. Endoscopic harvest of right leg greater saphenous vein.  SURGEON:  Ivin Poot, M.D.  ASSISTANT:  Lars Pinks, PA-C  ANESTHESIA:  General by Dr. Roberts Gaudy.  PREOPERATIVE DIAGNOSIS:  Status post ST-elevation myocardial infarction with ischemic cardiomyopathy and severe three-vessel disease in a diabetic pattern.  POSTOPERATIVE DIAGNOSIS:  Status post ST-elevation myocardial infarction with ischemic cardiomyopathy and severe three-vessel disease in a diabetic pattern.  CLINICAL NOTE:  The patient is a 55 year old poorly controlled diabetic who presented in August of this year with sepsis from a left axillary subcutaneous abscess with MRSA, which required general surgical drainage and debridement.  Postop, he had an MI.  Cardiac catheterization demonstrated chronic occlusion of his circumflex system, a small non- dominant right coronary artery and LAD with a significant proximal stenosis.  Ejection fraction was 25-30%.  I saw the patient in consultation at that time, but did not recommend surgery because of his active MRSA infection on his chest close to the sternum.  The patient was treated with prolonged antibiotics, wound care, and I followed the patient as an outpatient in the office.  When his wound healed and his diabetes was under better control, I recommended that he undergo high-risk multivessel CABG for severe 3 vessel CAD with poor LV function.  I discussed the details  of the surgery, the alternatives to surgery, and the expected postoperative hospital recovery.  I discussed the risks to him of the operation including risks of stroke, bleeding, blood transfusion requirement, postoperative MI, postoperative multi- system failure, postoperative pulmonary problems including pleural effusion, postoperative infection, and death.  He understood that his operation was at increased risk because of the poor targets for grafting and his moderate to severe LV dysfunction.  He agreed to proceed with surgery.  OPERATIVE FINDINGS: 1. Difficult conduits, small but adequate left IMA, small suboptimal     but adequate saphenous vein grafts. 2. Poor native coronary vessels, poor targets for grafting. 3. No graftable vessel in the circumflex distribution.  PROCEDURE IN DETAIL:  The patient was brought to the operating room and placed supine on the operating table where general anesthesia was induced under invasive hemodynamic monitoring.  The chest, abdomen, and legs were prepped with Betadine and draped as a sterile field.  A sternal incision was made after a proper time-out.  The vein was harvested endoscopically from the right leg.  The internal mammary artery was harvested as a pedicle graft.  It was a 1.5-mm vessel with good flow.  The sternal retractor was placed and pericardium was opened and suspended.  Pursestrings were placed in ascending aorta and right atrium and after the heparin was  administered, the patient was cannulated and placed on cardiopulmonary bypass.  The coronaries were identified for grafting.  The distal LAD, diagonal, and ramus intermediate were found to be adequate targets but poor targets for grafting.  The circumflex had no graftable vessels.  The mammary artery and vein grafts were prepared for the distal anastomoses and cardioplegia cannulas were placed for both antegrade and retrograde cold blood cardioplegia.  The patient was cooled  to 32 degrees and the aortic crossclamp was applied.  A liter of cold blood cardioplegia was delivered in split doses between the antegrade aortic and retrograde coronary sinus catheters.  There was good cardioplegic arrest and septal temperature dropped less than 12 degrees.  Cardioplegia was delivered every 20 minutes.  The distal coronary anastomoses were performed.  The first distal anastomosis was the ramus intermediate branch of the left coronary. This was intramyocardial.  There was a proximal 95% stenosis.  It was a 1.5-mm vessel.  Reverse saphenous vein was sewn end-to-side with running 7-0 Prolene with good flow through the graft.  Cardioplegia was redosed.  The second distal anastomosis was to the second diagonal branch of the LAD.  This was a 1.2-mm vessel with proximal ostial 90% stenosis.  A reverse saphenous vein was sewn end-to-side with running 7-0 Prolene with good flow through the graft.  The third distal anastomosis was to the distal LAD.  This was a 1.5-mm vessel with a tight proximal 90% stenosis.  The left IMA pedicle was brought through an opening in the left lateral pericardium and was brought down onto the LAD and sewn end-to-side with running 8-0 Prolene. There was good flow through the anastomosis after briefly releasing the bulldog on the mammary pedicle.  The bulldog was re-applied and the pedicle was secured to the epicardium.  Cardioplegia was redosed.  While the crossclamp was still in place, 2 proximal vein anastomoses were performed on the ascending aorta using a 4.5-mm punch running 6-0 Prolene.  Prior to tying down the final proximal anastomosis, air was vented from the coronaries with a dose of retrograde warm blood cardioplegia.  The crossclamp was removed.  The heart was cardioverted back to a regular rhythm.  The vein grafts were de-aired, opened and examined.  Each had good flow and hemostasis was documented at the proximal and distal  anastomoses.  The patient was rewarmed and reperfused.  Temporary pacing wires were applied.  The lungs were expanded and ventilator was resumed.  The patient was started on low-dose milrinone and dopamine.  The patient was slowly weaned, carefully weaned off cardiopulmonary bypass.  LV function gradually improved to exceed the preoperative ejection fraction. Protamine was administered carefully without adverse reaction.  The cannulas were removed.  The mediastinum was irrigated.  The superior pericardial fat was closed over the aorta.  Anterior mediastinal and left pleural chest tubes were placed and brought out through separate incisions.  The sternum was closed with wire.  The pectoralis fascia was closed with a running #1 Vicryl.  The subcutaneous and skin layers were closed with running Vicryl.  Sterile dressings were applied.  Total cardiopulmonary bypass time was 120 minutes.     Ivin Poot, M.D.     PV/MEDQ  D:  08/24/2016  T:  08/25/2016  Job:  [id number]

## 2016-08-26 ENCOUNTER — Inpatient Hospital Stay (HOSPITAL_COMMUNITY): Payer: BLUE CROSS/BLUE SHIELD

## 2016-08-26 LAB — GLUCOSE, CAPILLARY
Glucose-Capillary: 228 mg/dL — ABNORMAL HIGH (ref 65–99)
Glucose-Capillary: 243 mg/dL — ABNORMAL HIGH (ref 65–99)
Glucose-Capillary: 248 mg/dL — ABNORMAL HIGH (ref 65–99)
Glucose-Capillary: 249 mg/dL — ABNORMAL HIGH (ref 65–99)
Glucose-Capillary: 260 mg/dL — ABNORMAL HIGH (ref 65–99)
Glucose-Capillary: 271 mg/dL — ABNORMAL HIGH (ref 65–99)

## 2016-08-26 LAB — CBC
HCT: 27.9 % — ABNORMAL LOW (ref 39.0–52.0)
Hemoglobin: 9.3 g/dL — ABNORMAL LOW (ref 13.0–17.0)
MCH: 27.8 pg (ref 26.0–34.0)
MCHC: 33.3 g/dL (ref 30.0–36.0)
MCV: 83.3 fL (ref 78.0–100.0)
Platelets: 112 10*3/uL — ABNORMAL LOW (ref 150–400)
RBC: 3.35 MIL/uL — ABNORMAL LOW (ref 4.22–5.81)
RDW: 14.5 % (ref 11.5–15.5)
WBC: 15.4 10*3/uL — ABNORMAL HIGH (ref 4.0–10.5)

## 2016-08-26 LAB — BASIC METABOLIC PANEL
Anion gap: 10 (ref 5–15)
BUN: 16 mg/dL (ref 6–20)
CO2: 24 mmol/L (ref 22–32)
Calcium: 7.8 mg/dL — ABNORMAL LOW (ref 8.9–10.3)
Chloride: 96 mmol/L — ABNORMAL LOW (ref 101–111)
Creatinine, Ser: 1.17 mg/dL (ref 0.61–1.24)
GFR calc Af Amer: >60 mL/min (ref >60)
GFR calc non Af Amer: >60 mL/min (ref >60)
Glucose, Bld: 261 mg/dL — ABNORMAL HIGH (ref 65–99)
Potassium: 4.4 mmol/L (ref 3.5–5.1)
Sodium: 130 mmol/L — ABNORMAL LOW (ref 135–145)

## 2016-08-26 LAB — POCT I-STAT 3, ART BLOOD GAS (G3+)
Acid-base deficit: 2 mmol/L (ref 0.0–2.0)
Bicarbonate: 24.9 mmol/L (ref 20.0–28.0)
O2 Saturation: 89 %
Patient temperature: 98.6
TCO2: 26 mmol/L (ref 0–100)
pCO2 arterial: 49.2 mmHg — ABNORMAL HIGH (ref 32.0–48.0)
pH, Arterial: 7.311 — ABNORMAL LOW (ref 7.350–7.450)
pO2, Arterial: 62 mmHg — ABNORMAL LOW (ref 83.0–108.0)

## 2016-08-26 LAB — COOXEMETRY PANEL
Carboxyhemoglobin: 1.2 % (ref 0.5–1.5)
Methemoglobin: 1.3 % (ref 0.0–1.5)
O2 Saturation: 50.8 %
Total hemoglobin: 9.5 g/dL — ABNORMAL LOW (ref 12.0–16.0)

## 2016-08-26 LAB — PROTIME-INR
INR: 1.16
Prothrombin Time: 14.8 seconds (ref 11.4–15.2)

## 2016-08-26 LAB — POCT I-STAT, CHEM 8
BUN: 23 mg/dL — ABNORMAL HIGH (ref 6–20)
Calcium, Ion: 1.1 mmol/L — ABNORMAL LOW (ref 1.15–1.40)
Chloride: 91 mmol/L — ABNORMAL LOW (ref 101–111)
Creatinine, Ser: 1.4 mg/dL — ABNORMAL HIGH (ref 0.61–1.24)
Glucose, Bld: 262 mg/dL — ABNORMAL HIGH (ref 65–99)
HCT: 28 % — ABNORMAL LOW (ref 39.0–52.0)
Hemoglobin: 9.5 g/dL — ABNORMAL LOW (ref 13.0–17.0)
Potassium: 4.7 mmol/L (ref 3.5–5.1)
Sodium: 129 mmol/L — ABNORMAL LOW (ref 135–145)
TCO2: 27 mmol/L (ref 0–100)

## 2016-08-26 MED ORDER — FENTANYL 75 MCG/HR TD PT72
75.0000 ug | MEDICATED_PATCH | TRANSDERMAL | Status: DC
  Administered 2016-08-26: 75 ug via TRANSDERMAL
  Filled 2016-08-26: qty 1

## 2016-08-26 MED ORDER — FUROSEMIDE 10 MG/ML IJ SOLN
40.0000 mg | Freq: Two times a day (BID) | INTRAMUSCULAR | Status: DC
  Administered 2016-08-26 – 2016-08-27 (×4): 40 mg via INTRAVENOUS
  Filled 2016-08-26 (×4): qty 4

## 2016-08-26 MED ORDER — MILRINONE LACTATE IN DEXTROSE 20-5 MG/100ML-% IV SOLN
0.2500 ug/kg/min | INTRAVENOUS | Status: DC
  Administered 2016-08-27 – 2016-08-28 (×3): 0.3 ug/kg/min via INTRAVENOUS
  Filled 2016-08-26 (×3): qty 100

## 2016-08-26 MED ORDER — INSULIN DETEMIR 100 UNIT/ML ~~LOC~~ SOLN
30.0000 [IU] | Freq: Two times a day (BID) | SUBCUTANEOUS | Status: DC
  Administered 2016-08-26 – 2016-08-28 (×5): 30 [IU] via SUBCUTANEOUS
  Filled 2016-08-26 (×7): qty 0.3

## 2016-08-26 MED ORDER — MORPHINE SULFATE (PF) 4 MG/ML IV SOLN
4.0000 mg | INTRAVENOUS | Status: DC | PRN
  Administered 2016-08-26: 4 mg via INTRAVENOUS
  Filled 2016-08-26: qty 1

## 2016-08-26 MED ORDER — DOPAMINE-DEXTROSE 3.2-5 MG/ML-% IV SOLN
3.0000 ug/kg/min | INTRAVENOUS | Status: DC
  Administered 2016-08-26 – 2016-08-28 (×2): 3 ug/kg/min via INTRAVENOUS
  Filled 2016-08-26 (×2): qty 250

## 2016-08-26 MED ORDER — METOLAZONE 5 MG PO TABS
5.0000 mg | ORAL_TABLET | Freq: Once | ORAL | Status: AC
  Administered 2016-08-26: 5 mg via ORAL
  Filled 2016-08-26: qty 1

## 2016-08-26 MED ORDER — NALOXONE HCL 0.4 MG/ML IJ SOLN
INTRAMUSCULAR | Status: AC
  Filled 2016-08-26: qty 1

## 2016-08-26 MED ORDER — LEVALBUTEROL HCL 0.63 MG/3ML IN NEBU: 0.6300 mg | INHALATION_SOLUTION | Freq: Four times a day (QID) | RESPIRATORY_TRACT | Status: DC | PRN

## 2016-08-26 MED ORDER — FUROSEMIDE 10 MG/ML IJ SOLN: 40.0000 mg | Freq: Two times a day (BID) | INTRAMUSCULAR | Status: DC

## 2016-08-26 MED ORDER — NALOXONE HCL 0.4 MG/ML IJ SOLN
0.2000 mg | INTRAMUSCULAR | Status: DC | PRN
  Administered 2016-08-26: 0.2 mg via INTRAVENOUS

## 2016-08-26 MED ORDER — INSULIN DETEMIR 100 UNIT/ML ~~LOC~~ SOLN
24.0000 [IU] | Freq: Two times a day (BID) | SUBCUTANEOUS | Status: DC
Start: 2016-08-26 — End: 2016-08-26
  Administered 2016-08-26: 24 [IU] via SUBCUTANEOUS
  Filled 2016-08-26 (×2): qty 0.24

## 2016-08-26 MED ORDER — FUROSEMIDE 10 MG/ML IJ SOLN
40.0000 mg | Freq: Once | INTRAMUSCULAR | Status: AC
  Administered 2016-08-26: 40 mg via INTRAVENOUS
  Filled 2016-08-26: qty 4

## 2016-08-26 MED ORDER — MIDAZOLAM HCL 2 MG/2ML IJ SOLN: 2.0000 mg | Freq: Four times a day (QID) | INTRAMUSCULAR | Status: DC | PRN

## 2016-08-26 NOTE — Progress Notes (Signed)
Ambulated pt 360ft around unit. Pt began to act "loopy" half way through walk. He was able to answer all orientation questions but acted as if he was going to fall asleep. Was able to get pt back in room and sitting in chair safely. Vitals stable. Will continue to monitor closely.

## 2016-08-26 NOTE — Progress Notes (Signed)
2 Days Post-Op Procedure(s) (LRB): CORONARY ARTERY BYPASS GRAFTING (CABG) x three,  using left internal mammary artery and right leg greater saphenous vein harvested endoscopically (N/A) TRANSESOPHAGEAL ECHOCARDIOGRAM (TEE) (N/A) CLIPPING OF ATRIAL APPENDAGE (N/A) Subjective: Ischemic CM s/p CABG, low EF Hx PAF now NSR C/o incisional pain Wt up 12 lbs HX MRSA cutaneous abscesses- uncontrolled DM Objective: Vital signs in last 24 hours: Temp:  [97.9 F (36.6 C)-98.5 F (36.9 C)] 98 F (36.7 C) (11/08 0357) Pulse Rate:  [90-113] 101 (11/08 0700) Cardiac Rhythm: Sinus tachycardia (11/08 0400) Resp:  [7-30] 20 (11/08 0700) BP: (70-157)/(49-96) 70/49 (11/08 0600) SpO2:  [88 %-100 %] 88 % (11/08 0700) Weight:  [178 lb 2.1 oz (80.8 kg)] 178 lb 2.1 oz (80.8 kg) (11/08 0500)  Hemodynamic parameters for last 24 hours: PAP: (32-49)/(17-23) 32/17 CO:  [5.3 L/min] 5.3 L/min CI:  [3 L/min/m2] 3 L/min/m2  Intake/Output from previous day: 11/07 0701 - 11/08 0700 In: 1779.6 [P.O.:120; I.V.:1459.6; IV Piggyback:200] Out: [id number]; Chest Tube:270] Intake/Output this shift: No intake/output data recorded.       Exam    General- alert and comfortable, up in chair   Lungs- clear without rales, wheezes   Cor- regular rate and rhythm, no murmur , gallop   Abdomen- soft, non-tender   Extremities - warm, non-tender, minimal edema   Neuro- oriented, appropriate, no focal weakness   Lab Results:  Recent Labs  08/25/16 1700 08/25/16 1737 08/26/16 0417  WBC 14.7*  --  15.4*  HGB 9.7* 9.9* 9.3*  HCT 28.8* 29.0* 27.9*  PLT 106*  --  112*   BMET:  Recent Labs  08/25/16 0415  08/25/16 1737 08/26/16 0417  NA 141  --  135 130*  K 4.3  --  4.0 4.4  CL 106  --  97* 96*  CO2 25  --   --  24  GLUCOSE 77  --  228* 261*  BUN 18  --  17 16  CREATININE 1.10  < > 1.00 1.17  CALCIUM 8.0*  --   --  7.8*  < > = values in this interval not displayed.  PT/INR:  Recent Labs   08/26/16 0417  LABPROT 14.8  INR 1.16   ABG    Component Value Date/Time   PHART 7.324 (L) 08/25/2016 1742   HCO3 23.0 08/25/2016 1742   TCO2 24 08/25/2016 1742   ACIDBASEDEF 3.0 (H) 08/25/2016 1742   O2SAT 92.0 08/25/2016 1742   CBG (last 3)   Recent Labs  08/25/16 1921 08/25/16 2341 08/26/16 0345  GLUCAP 236* 277* 260*    Assessment/Plan: S/P Procedure(s) (LRB): CORONARY ARTERY BYPASS GRAFTING (CABG) x three,  using left internal mammary artery and right leg greater saphenous vein harvested endoscopically (N/A) TRANSESOPHAGEAL ECHOCARDIOGRAM (TEE) (N/A) CLIPPING OF ATRIAL APPENDAGE (N/A) Mobilize Diuresis Diabetes control cont low dose milrinone, norepi and diurese   LOS: 2 days    Tharon Aquas Trigt III 08/26/2016

## 2016-08-26 NOTE — Progress Notes (Signed)
      [address]       [place] [address]             [phone]      Up in chair  BP 118/72   Pulse 85   Temp 98.5 F (36.9 C) (Oral)   Resp 11   Ht 5\' 6"  (1.676 m)   Wt 178 lb 2.1 oz (80.8 kg)   SpO2 93%   BMI 28.75 kg/m    Intake/Output Summary (Last 24 hours) at 08/26/16 1728 Last data filed at 08/26/16 1700  Gross per 24 hour  Intake          1175.91 ml  Output             1505 ml  Net          -329.09 ml   CBG still markedly elevated  Remains on dopamine, milrinone and norepi  Lanier Millon C. Roxan Hockey, MD [facility] [phone]

## 2016-08-26 NOTE — Progress Notes (Signed)
Anesthesiology Follow-up:  Awake and alert, sitting in chair complaining of moderate left chest wall incisional pain. Requiring 3 mcg/min of noreipnephrine and milrinone 0.25 mcg/kg/min for hemodynamic support.  VS: T-36.7 BP- 130/80 HR- 94 (SR) RR- 13 o2 Sat 91% on 5 L [place]  Na- 130 K- 4.4 BUN/Cr. 16/1.17 glucose 189 H/H- 9.3/27.9 platelets- 112,000  Extubated 3 1/2 hours post-op  55 year old male 2 days S/P CABG X 3 and l atrial appendage clipping. Patient has moderate LV dysfunction. Still requiring pressors for hemodynamic support, plan gentile diuresis.  Roberts Gaudy

## 2016-08-26 NOTE — Addendum Note (Signed)
Addendum  created 08/26/16 1241 by Roberts Gaudy, MD   Sign clinical note

## 2016-08-26 NOTE — Progress Notes (Signed)
Dr. Roxan Hockey made aware of CVP 17 and lung sounds tight and congested. Orders received for Xopenex PRN and 1 dose of IV Lasix. Will continue to closely monitor. Richarda Blade RN

## 2016-08-27 ENCOUNTER — Inpatient Hospital Stay (HOSPITAL_COMMUNITY): Payer: BLUE CROSS/BLUE SHIELD

## 2016-08-27 LAB — COMPREHENSIVE METABOLIC PANEL
ALT: 18 U/L (ref 17–63)
AST: 26 U/L (ref 15–41)
Albumin: 3.2 g/dL — ABNORMAL LOW (ref 3.5–5.0)
Alkaline Phosphatase: 64 U/L (ref 38–126)
Anion gap: 9 (ref 5–15)
BUN: 20 mg/dL (ref 6–20)
CO2: 29 mmol/L (ref 22–32)
Calcium: 8.2 mg/dL — ABNORMAL LOW (ref 8.9–10.3)
Chloride: 93 mmol/L — ABNORMAL LOW (ref 101–111)
Creatinine, Ser: 1.23 mg/dL (ref 0.61–1.24)
GFR calc Af Amer: >60 mL/min (ref >60)
GFR calc non Af Amer: >60 mL/min (ref >60)
Glucose, Bld: 193 mg/dL — ABNORMAL HIGH (ref 65–99)
Potassium: 3.9 mmol/L (ref 3.5–5.1)
Sodium: 131 mmol/L — ABNORMAL LOW (ref 135–145)
Total Bilirubin: 0.8 mg/dL (ref 0.3–1.2)
Total Protein: 6.5 g/dL (ref 6.5–8.1)

## 2016-08-27 LAB — CBC
HCT: 25.4 % — ABNORMAL LOW (ref 39.0–52.0)
Hemoglobin: 8.5 g/dL — ABNORMAL LOW (ref 13.0–17.0)
MCH: 27.7 pg (ref 26.0–34.0)
MCHC: 33.5 g/dL (ref 30.0–36.0)
MCV: 82.7 fL (ref 78.0–100.0)
Platelets: 106 10*3/uL — ABNORMAL LOW (ref 150–400)
RBC: 3.07 MIL/uL — ABNORMAL LOW (ref 4.22–5.81)
RDW: 14.4 % (ref 11.5–15.5)
WBC: 12.8 10*3/uL — ABNORMAL HIGH (ref 4.0–10.5)

## 2016-08-27 LAB — GLUCOSE, CAPILLARY
Glucose-Capillary: 144 mg/dL — ABNORMAL HIGH (ref 65–99)
Glucose-Capillary: 152 mg/dL — ABNORMAL HIGH (ref 65–99)
Glucose-Capillary: 178 mg/dL — ABNORMAL HIGH (ref 65–99)
Glucose-Capillary: 200 mg/dL — ABNORMAL HIGH (ref 65–99)
Glucose-Capillary: 206 mg/dL — ABNORMAL HIGH (ref 65–99)
Glucose-Capillary: 233 mg/dL — ABNORMAL HIGH (ref 65–99)

## 2016-08-27 LAB — COOXEMETRY PANEL
Carboxyhemoglobin: 1 % (ref 0.5–1.5)
Methemoglobin: 1.2 % (ref 0.0–1.5)
O2 Saturation: 59.7 %
Total hemoglobin: 9.3 g/dL — ABNORMAL LOW (ref 12.0–16.0)

## 2016-08-27 LAB — POCT I-STAT, CHEM 8
BUN: 22 mg/dL — ABNORMAL HIGH (ref 6–20)
Calcium, Ion: 1.06 mmol/L — ABNORMAL LOW (ref 1.15–1.40)
Chloride: 86 mmol/L — ABNORMAL LOW (ref 101–111)
Creatinine, Ser: 1.3 mg/dL — ABNORMAL HIGH (ref 0.61–1.24)
Glucose, Bld: 281 mg/dL — ABNORMAL HIGH (ref 65–99)
HCT: 26 % — ABNORMAL LOW (ref 39.0–52.0)
Hemoglobin: 8.8 g/dL — ABNORMAL LOW (ref 13.0–17.0)
Potassium: 3.9 mmol/L (ref 3.5–5.1)
Sodium: 128 mmol/L — ABNORMAL LOW (ref 135–145)
TCO2: 27 mmol/L (ref 0–100)

## 2016-08-27 LAB — PROTIME-INR
INR: 1.14
Prothrombin Time: 14.6 seconds (ref 11.4–15.2)

## 2016-08-27 MED ORDER — FENTANYL CITRATE (PF) 100 MCG/2ML IJ SOLN: 25.0000 ug | INTRAMUSCULAR | Status: DC | PRN

## 2016-08-27 MED ORDER — AMIODARONE LOAD VIA INFUSION
150.0000 mg | Freq: Once | INTRAVENOUS | Status: AC
Start: 2016-08-27 — End: 2016-08-27
  Administered 2016-08-27: 150 mg via INTRAVENOUS
  Filled 2016-08-27: qty 83.34

## 2016-08-27 MED ORDER — DEXTROSE 5 % IV SOLN
5.0000 mg/h | INTRAVENOUS | Status: DC
  Filled 2016-08-27: qty 100

## 2016-08-27 MED ORDER — AMIODARONE HCL IN DEXTROSE 360-4.14 MG/200ML-% IV SOLN
30.0000 mg/h | INTRAVENOUS | Status: DC
  Administered 2016-08-27 – 2016-08-28 (×2): 30 mg/h via INTRAVENOUS
  Filled 2016-08-27: qty 200

## 2016-08-27 MED ORDER — AMIODARONE HCL IN DEXTROSE 360-4.14 MG/200ML-% IV SOLN
60.0000 mg/h | INTRAVENOUS | Status: AC
  Administered 2016-08-27: 60 mg/h via INTRAVENOUS
  Filled 2016-08-27: qty 200

## 2016-08-27 MED ORDER — SODIUM CHLORIDE 0.9% FLUSH
10.0000 mL | INTRAVENOUS | Status: DC | PRN
  Administered 2016-09-02 (×2): 10 mL
  Filled 2016-08-27 (×2): qty 40

## 2016-08-27 MED ORDER — SODIUM CHLORIDE 0.9% FLUSH
10.0000 mL | Freq: Two times a day (BID) | INTRAVENOUS | Status: DC
  Administered 2016-08-27 – 2016-09-04 (×10): 10 mL

## 2016-08-27 MED ORDER — SIMETHICONE 80 MG PO CHEW
80.0000 mg | CHEWABLE_TABLET | Freq: Four times a day (QID) | ORAL | Status: DC
Start: 2016-08-27 — End: 2016-09-04
  Administered 2016-08-27 – 2016-09-04 (×30): 80 mg via ORAL
  Filled 2016-08-27 (×30): qty 1

## 2016-08-27 MED ORDER — AMIODARONE HCL IN DEXTROSE 360-4.14 MG/200ML-% IV SOLN
INTRAVENOUS | Status: AC
  Filled 2016-08-27: qty 200

## 2016-08-27 NOTE — Progress Notes (Signed)
3 Days Post-Op Procedure(s) (LRB): CORONARY ARTERY BYPASS GRAFTING (CABG) x three,  using left internal mammary artery and right leg greater saphenous vein harvested endoscopically (N/A) TRANSESOPHAGEAL ECHOCARDIOGRAM (TEE) (N/A) CLIPPING OF ATRIAL APPENDAGE (N/A) Subjective: Urine output and co-ox better this am after renal dopamine resumed Transient afib today now nsr using iv amiodarone Gastric dilatation treated with NG tube decompression PICC line placed for inotropic therapies  Objective: Vital signs in last 24 hours: Temp:  [97.7 F (36.5 C)-98.6 F (37 C)] 98 F (36.7 C) (11/09 1211) Pulse Rate:  [49-138] 77 (11/09 1330) Cardiac Rhythm: Atrial fibrillation (11/09 1200) Resp:  [6-19] 12 (11/09 1330) BP: (103-140)/(63-90) 107/66 (11/09 1330) SpO2:  [81 %-100 %] 100 % (11/09 1330) Weight:  [178 lb 5.6 oz (80.9 kg)] 178 lb 5.6 oz (80.9 kg) (11/09 0500)  Hemodynamic parameters for last 24 hours: CVP:  [13 mmHg-21 mmHg] 21 mmHg  Intake/Output from previous day: 11/08 0701 - 11/09 0700 In: 554.1 [I.V.:554.1] Out: 1990 [Urine:1970; Chest Tube:20] Intake/Output this shift: Total I/O In: 306.6 [I.V.:306.6] Out: 1000 [Urine:1000]       Exam    General- alert and comfortable   Lungs- clear without rales, wheezes   Cor- regular rate and rhythm, no murmur , gallop   Abdomen- non-tender, scant bowel sounds   Extremities - warm, non-tender, minimal edema   Neuro- oriented, appropriate, no focal weakness   Lab Results:  Recent Labs  08/26/16 0417 08/26/16 1726 08/27/16 0500  WBC 15.4*  --  12.8*  HGB 9.3* 9.5* 8.5*  HCT 27.9* 28.0* 25.4*  PLT 112*  --  106*   BMET:  Recent Labs  08/26/16 0417 08/26/16 1726 08/27/16 0500  NA 130* 129* 131*  K 4.4 4.7 3.9  CL 96* 91* 93*  CO2 24  --  29  GLUCOSE 261* 262* 193*  BUN 16 23* 20  CREATININE 1.17 1.40* 1.23  CALCIUM 7.8*  --  8.2*    PT/INR:  Recent Labs  08/27/16 0500  LABPROT 14.6  INR 1.14   ABG     Component Value Date/Time   PHART 7.311 (L) 08/26/2016 1950   HCO3 24.9 08/26/2016 1950   TCO2 26 08/26/2016 1950   ACIDBASEDEF 2.0 08/26/2016 1950   O2SAT 59.7 08/27/2016 0420   CBG (last 3)   Recent Labs  08/27/16 0344 08/27/16 0819 08/27/16 1210  GLUCAP 200* 144* 152*    Assessment/Plan: S/P Procedure(s) (LRB): CORONARY ARTERY BYPASS GRAFTING (CABG) x three,  using left internal mammary artery and right leg greater saphenous vein harvested endoscopically (N/A) TRANSESOPHAGEAL ECHOCARDIOGRAM (TEE) (N/A) CLIPPING OF ATRIAL APPENDAGE (N/A) Mobilize Diuresis Diabetes control iv amiodarone for rapid afib   LOS: 3 days    [name] 08/27/2016

## 2016-08-27 NOTE — Progress Notes (Signed)
Peripherally Inserted Central Catheter/Midline Placement  The IV Nurse has discussed with the patient and/or persons authorized to consent for the patient, the purpose of this procedure and the potential benefits and risks involved with this procedure.  The benefits include less needle sticks, lab draws from the catheter, and the patient may be discharged home with the catheter. Risks include, but not limited to, infection, bleeding, blood clot (thrombus formation), and puncture of an artery; nerve damage and irregular heartbeat and possibility to perform a PICC exchange if needed/ordered by physician.  Alternatives to this procedure were also discussed.  [name] Power PICC patient education guide, fact sheet on infection prevention and patient information card has been provided to patient /or left at bedside.    PICC/Midline Placement Documentation  PICC Triple Lumen [id number] PICC Right Basilic 41 cm 2 cm (Active)  Indication for Insertion or Continuance of Line Vasoactive infusions 08/27/2016 10:00 AM  Exposed Catheter (cm) 2 cm 08/27/2016 10:00 AM  Dressing Change Due 09/03/16 08/27/2016 10:00 AM       Jule Economy Horton 08/27/2016, 10:23 AM

## 2016-08-27 NOTE — Progress Notes (Signed)
CT surgery p.m. Rounds  Patient had good day today after converting to sinus rhythm with amiodarone IV protocol Less nausea after NG tube decompressed gastric dilatation Now back on full liquids Ambulated in the hallway Good urine output Remains on oxygen 2-4 L P.m. labs-potassium 3.9 creatinine 1.3 hemoglobin 8.8

## 2016-08-27 NOTE — Progress Notes (Addendum)
11:45- Notified Dr. Prescott Gum pt received Amiodarone bolus 30 minutes ago. Pt has tried to convert out of afib several times but has consistently remained in a fib with a HR of 140s-150s. Per MD will start a Cardizem gtt and will maintain a systolic BP greater than [id number]. Pt asymptomatic with stable vitals. Will continue to monitor closely.

## 2016-08-28 ENCOUNTER — Inpatient Hospital Stay (HOSPITAL_COMMUNITY): Payer: BLUE CROSS/BLUE SHIELD

## 2016-08-28 LAB — POCT I-STAT, CHEM 8
BUN: 20 mg/dL (ref 6–20)
Calcium, Ion: 1.04 mmol/L — ABNORMAL LOW (ref 1.15–1.40)
Chloride: 88 mmol/L — ABNORMAL LOW (ref 101–111)
Creatinine, Ser: 1.4 mg/dL — ABNORMAL HIGH (ref 0.61–1.24)
Glucose, Bld: 224 mg/dL — ABNORMAL HIGH (ref 65–99)
HCT: 25 % — ABNORMAL LOW (ref 39.0–52.0)
Hemoglobin: 8.5 g/dL — ABNORMAL LOW (ref 13.0–17.0)
Potassium: 4.7 mmol/L (ref 3.5–5.1)
Sodium: 133 mmol/L — ABNORMAL LOW (ref 135–145)
TCO2: 30 mmol/L (ref 0–100)

## 2016-08-28 LAB — COMPREHENSIVE METABOLIC PANEL
ALT: 16 U/L — ABNORMAL LOW (ref 17–63)
AST: 23 U/L (ref 15–41)
Albumin: 2.9 g/dL — ABNORMAL LOW (ref 3.5–5.0)
Alkaline Phosphatase: 63 U/L (ref 38–126)
Anion gap: 9 (ref 5–15)
BUN: 20 mg/dL (ref 6–20)
CO2: 30 mmol/L (ref 22–32)
Calcium: 8 mg/dL — ABNORMAL LOW (ref 8.9–10.3)
Chloride: 91 mmol/L — ABNORMAL LOW (ref 101–111)
Creatinine, Ser: 1.18 mg/dL (ref 0.61–1.24)
GFR calc Af Amer: >60 mL/min (ref >60)
GFR calc non Af Amer: >60 mL/min (ref >60)
Glucose, Bld: 198 mg/dL — ABNORMAL HIGH (ref 65–99)
Potassium: 3.3 mmol/L — ABNORMAL LOW (ref 3.5–5.1)
Sodium: 130 mmol/L — ABNORMAL LOW (ref 135–145)
Total Bilirubin: 0.7 mg/dL (ref 0.3–1.2)
Total Protein: 6.2 g/dL — ABNORMAL LOW (ref 6.5–8.1)

## 2016-08-28 LAB — COOXEMETRY PANEL
Carboxyhemoglobin: 0.9 % (ref 0.5–1.5)
Methemoglobin: 1 % (ref 0.0–1.5)
O2 Saturation: 51.2 %
Total hemoglobin: 8.4 g/dL — ABNORMAL LOW (ref 12.0–16.0)

## 2016-08-28 LAB — GLUCOSE, CAPILLARY
Glucose-Capillary: 187 mg/dL — ABNORMAL HIGH (ref 65–99)
Glucose-Capillary: 178 mg/dL — ABNORMAL HIGH (ref 65–99)
Glucose-Capillary: 205 mg/dL — ABNORMAL HIGH (ref 65–99)
Glucose-Capillary: 125 mg/dL — ABNORMAL HIGH (ref 65–99)
Glucose-Capillary: 42 mg/dL — CL (ref 65–99)
Glucose-Capillary: 45 mg/dL — ABNORMAL LOW (ref 65–99)

## 2016-08-28 LAB — PROTIME-INR
INR: 1.05
Prothrombin Time: 13.7 seconds (ref 11.4–15.2)

## 2016-08-28 LAB — CARBOXYHEMOGLOBIN - COOX: Carboxyhemoglobin: 1 % (ref 0.5–1.5)

## 2016-08-28 LAB — CBC
HCT: 23.3 % — ABNORMAL LOW (ref 39.0–52.0)
Hemoglobin: 8 g/dL — ABNORMAL LOW (ref 13.0–17.0)
MCH: 27.9 pg (ref 26.0–34.0)
MCHC: 34.3 g/dL (ref 30.0–36.0)
MCV: 81.2 fL (ref 78.0–100.0)
Platelets: 88 10*3/uL — ABNORMAL LOW (ref 150–400)
RBC: 2.87 MIL/uL — ABNORMAL LOW (ref 4.22–5.81)
RDW: 14 % (ref 11.5–15.5)
WBC: 9.4 10*3/uL (ref 4.0–10.5)

## 2016-08-28 MED ORDER — NOREPINEPHRINE BITARTRATE 1 MG/ML IV SOLN: 4.0000 ug/min | INTRAVENOUS | Status: DC

## 2016-08-28 MED ORDER — POTASSIUM CHLORIDE 10 MEQ/50ML IV SOLN
10.0000 meq | INTRAVENOUS | Status: AC
  Administered 2016-08-28 (×3): 10 meq via INTRAVENOUS
  Filled 2016-08-28 (×2): qty 50

## 2016-08-28 MED ORDER — METOLAZONE 5 MG PO TABS
5.0000 mg | ORAL_TABLET | Freq: Once | ORAL | Status: AC
  Administered 2016-08-28: 5 mg via ORAL
  Filled 2016-08-28: qty 1

## 2016-08-28 MED ORDER — FE FUMARATE-B12-VIT C-FA-IFC PO CAPS
1.0000 | ORAL_CAPSULE | Freq: Two times a day (BID) | ORAL | Status: DC
  Administered 2016-08-28 – 2016-09-04 (×15): 1 via ORAL
  Filled 2016-08-28 (×15): qty 1

## 2016-08-28 MED ORDER — MILRINONE LACTATE IN DEXTROSE 20-5 MG/100ML-% IV SOLN
0.1250 ug/kg/min | INTRAVENOUS | Status: DC
  Administered 2016-08-28 – 2016-09-01 (×6): 0.3 ug/kg/min via INTRAVENOUS
  Administered 2016-09-02: 0.125 ug/kg/min via INTRAVENOUS
  Filled 2016-08-28 (×8): qty 100

## 2016-08-28 MED ORDER — AMIODARONE HCL 200 MG PO TABS
200.0000 mg | ORAL_TABLET | Freq: Two times a day (BID) | ORAL | Status: DC
  Administered 2016-08-28 – 2016-09-02 (×12): 200 mg via ORAL
  Filled 2016-08-28 (×12): qty 1

## 2016-08-28 MED ORDER — SORBITOL 70 % SOLN
60.0000 mL | Freq: Once | Status: AC
  Administered 2016-08-28: 30 mL via ORAL
  Filled 2016-08-28: qty 60

## 2016-08-28 MED ORDER — WARFARIN SODIUM 2.5 MG PO TABS
2.5000 mg | ORAL_TABLET | Freq: Every day | ORAL | Status: DC
  Administered 2016-08-28 – 2016-08-30 (×3): 2.5 mg via ORAL
  Filled 2016-08-28 (×3): qty 1

## 2016-08-28 MED ORDER — FUROSEMIDE 10 MG/ML IJ SOLN
40.0000 mg | Freq: Every day | INTRAMUSCULAR | Status: DC
  Administered 2016-08-28 – 2016-08-30 (×3): 40 mg via INTRAVENOUS
  Filled 2016-08-28 (×3): qty 4

## 2016-08-28 NOTE — Progress Notes (Signed)
      [address]       [place] [address]             [phone]    Resting comfortably  BP 111/67   Pulse 91   Temp 98 F (36.7 C) (Oral)   Resp 14   Ht 5\' 6"  (1.676 m)   Wt 175 lb 11.3 oz (79.7 kg)   SpO2 99%   BMI 28.36 kg/m   99% sat on 2L [place]   Intake/Output Summary (Last 24 hours) at 08/28/16 1731 Last data filed at 08/28/16 1500  Gross per 24 hour  Intake          1349.38 ml  Output             2450 ml  Net         -1100.62 ml   On dopamine and milrinone  Continue current regimen  Remo Lipps C. Roxan Hockey, MD [facility] [phone]

## 2016-08-28 NOTE — Progress Notes (Addendum)
4 Days Post-Op Procedure(s) (LRB): CORONARY ARTERY BYPASS GRAFTING (CABG) x three,  using left internal mammary artery and right leg greater saphenous vein harvested endoscopically (N/A) TRANSESOPHAGEAL ECHOCARDIOGRAM (TEE) (N/A) CLIPPING OF ATRIAL APPENDAGE (N/A) Subjective: CABG for ischemic CM, low EF Feels weak In nsr CXR better, renal fx better Platelets low- no heparin and cont with coumadin for hx afib- LA      appendage clipped   No BM Objective: Vital signs in last 24 hours: Temp:  [97.7 F (36.5 C)-98.5 F (36.9 C)] 98.1 F (36.7 C) (11/10 0400) Pulse Rate:  [49-138] 80 (11/10 0700) Cardiac Rhythm: Normal sinus rhythm (11/10 0600) Resp:  [10-22] 15 (11/10 0700) BP: (86-135)/(54-92) 135/74 (11/10 0700) SpO2:  [81 %-100 %] 100 % (11/10 0700) Weight:  [175 lb 11.3 oz (79.7 kg)] 175 lb 11.3 oz (79.7 kg) (11/10 0500)  Hemodynamic parameters for last 24 hours: CVP:  [10 mmHg-17 mmHg] 10 mmHg  Intake/Output from previous day: 11/09 0701 - 11/10 0700 In: 1398.1 [I.V.:1398.1] Out: 2500 [Urine:2500] Intake/Output this shift: No intake/output data recorded.      Physical Exam       Exam    General- alert and comfortable   Lungs- clear without rales, wheezes   Cor- regular rate and rhythm, no murmur , gallop   Abdomen- soft, non-tender   Extremities - warm, non-tender, minimal edema   Neuro- oriented, appropriate, no focal weakness   Lab Results:  Recent Labs  08/27/16 0500 08/27/16 1629 08/28/16 0400  WBC 12.8*  --  9.4  HGB 8.5* 8.8* 8.0*  HCT 25.4* 26.0* 23.3*  PLT 106*  --  88*   BMET:  Recent Labs  08/27/16 0500 08/27/16 1629 08/28/16 0400  NA 131* 128* 130*  K 3.9 3.9 3.3*  CL 93* 86* 91*  CO2 29  --  30  GLUCOSE 193* 281* 198*  BUN 20 22* 20  CREATININE 1.23 1.30* 1.18  CALCIUM 8.2*  --  8.0*    PT/INR:  Recent Labs  08/28/16 0400  LABPROT 13.7  INR 1.05   ABG    Component Value Date/Time   PHART 7.311 (L) 08/26/2016 1950   HCO3 24.9 08/26/2016 1950   TCO2 27 08/27/2016 1629   ACIDBASEDEF 2.0 08/26/2016 1950   O2SAT 51.2 08/28/2016 0640   CBG (last 3)   Recent Labs  08/27/16 1924 08/27/16 2350 08/28/16 0357  GLUCAP 233* 206* 187*    Assessment/Plan: S/P Procedure(s) (LRB): CORONARY ARTERY BYPASS GRAFTING (CABG) x three,  using left internal mammary artery and right leg greater saphenous vein harvested endoscopically (N/A) TRANSESOPHAGEAL ECHOCARDIOGRAM (TEE) (N/A) CLIPPING OF ATRIAL APPENDAGE (N/A) Mobilize Diuresis Diabetes control postop anemia - start  Po iron  For acute postop blood loss anemia Co-ox 51%, wean NE and cont renal dop and milrinone Convert to po amiodarone Cont coumadin  LOS: 4 days    [name] 08/28/2016

## 2016-08-29 LAB — CBC
HCT: 25.6 % — ABNORMAL LOW (ref 39.0–52.0)
Hemoglobin: 8.6 g/dL — ABNORMAL LOW (ref 13.0–17.0)
MCH: 27.5 pg (ref 26.0–34.0)
MCHC: 33.6 g/dL (ref 30.0–36.0)
MCV: 81.8 fL (ref 78.0–100.0)
Platelets: 125 K/uL — ABNORMAL LOW (ref 150–400)
RBC: 3.13 MIL/uL — ABNORMAL LOW (ref 4.22–5.81)
RDW: 14.4 % (ref 11.5–15.5)
WBC: 6.4 K/uL (ref 4.0–10.5)

## 2016-08-29 LAB — GLUCOSE, CAPILLARY
Glucose-Capillary: 106 mg/dL — ABNORMAL HIGH (ref 65–99)
Glucose-Capillary: 141 mg/dL — ABNORMAL HIGH (ref 65–99)
Glucose-Capillary: 147 mg/dL — ABNORMAL HIGH (ref 65–99)
Glucose-Capillary: 152 mg/dL — ABNORMAL HIGH (ref 65–99)
Glucose-Capillary: 179 mg/dL — ABNORMAL HIGH (ref 65–99)
Glucose-Capillary: 188 mg/dL — ABNORMAL HIGH (ref 65–99)
Glucose-Capillary: 51 mg/dL — ABNORMAL LOW (ref 65–99)
Glucose-Capillary: 95 mg/dL (ref 65–99)

## 2016-08-29 LAB — COMPREHENSIVE METABOLIC PANEL WITH GFR
ALT: 16 U/L — ABNORMAL LOW (ref 17–63)
AST: 23 U/L (ref 15–41)
Albumin: 2.8 g/dL — ABNORMAL LOW (ref 3.5–5.0)
Alkaline Phosphatase: 71 U/L (ref 38–126)
Anion gap: 9 (ref 5–15)
BUN: 17 mg/dL (ref 6–20)
CO2: 32 mmol/L (ref 22–32)
Calcium: 8.3 mg/dL — ABNORMAL LOW (ref 8.9–10.3)
Chloride: 95 mmol/L — ABNORMAL LOW (ref 101–111)
Creatinine, Ser: 1.02 mg/dL (ref 0.61–1.24)
GFR calc Af Amer: >60 mL/min
GFR calc non Af Amer: >60 mL/min
Glucose, Bld: 164 mg/dL — ABNORMAL HIGH (ref 65–99)
Potassium: 3.3 mmol/L — ABNORMAL LOW (ref 3.5–5.1)
Sodium: 136 mmol/L (ref 135–145)
Total Bilirubin: 0.7 mg/dL (ref 0.3–1.2)
Total Protein: 6 g/dL — ABNORMAL LOW (ref 6.5–8.1)

## 2016-08-29 LAB — COOXEMETRY PANEL
Carboxyhemoglobin: 1 % (ref 0.5–1.5)
Methemoglobin: 1 % (ref 0.0–1.5)
O2 Saturation: 45.3 %
Total hemoglobin: 9 g/dL — ABNORMAL LOW (ref 12.0–16.0)

## 2016-08-29 LAB — PROTIME-INR
INR: 1.33
Prothrombin Time: 16.6 seconds — ABNORMAL HIGH (ref 11.4–15.2)

## 2016-08-29 MED ORDER — POTASSIUM CHLORIDE 10 MEQ/50ML IV SOLN
10.0000 meq | INTRAVENOUS | Status: AC
  Administered 2016-08-29 (×3): 10 meq via INTRAVENOUS
  Filled 2016-08-29 (×3): qty 50

## 2016-08-29 MED ORDER — INSULIN ASPART 100 UNIT/ML ~~LOC~~ SOLN
3.0000 [IU] | Freq: Three times a day (TID) | SUBCUTANEOUS | Status: DC
  Administered 2016-08-30 – 2016-09-01 (×7): 3 [IU] via SUBCUTANEOUS

## 2016-08-29 MED ORDER — INSULIN ASPART 100 UNIT/ML ~~LOC~~ SOLN
0.0000 [IU] | Freq: Every day | SUBCUTANEOUS | Status: DC
  Administered 2016-08-30: 3 [IU] via SUBCUTANEOUS
  Administered 2016-08-31 – 2016-09-01 (×2): 2 [IU] via SUBCUTANEOUS

## 2016-08-29 MED ORDER — ASPIRIN 81 MG PO CHEW
81.0000 mg | CHEWABLE_TABLET | Freq: Every day | ORAL | Status: DC
  Administered 2016-08-29 – 2016-09-04 (×7): 81 mg via ORAL
  Filled 2016-08-29 (×7): qty 1

## 2016-08-29 MED ORDER — POTASSIUM CHLORIDE 10 MEQ/50ML IV SOLN
10.0000 meq | INTRAVENOUS | Status: AC
  Administered 2016-08-29 (×2): 10 meq via INTRAVENOUS
  Filled 2016-08-29: qty 50

## 2016-08-29 MED ORDER — INSULIN DETEMIR 100 UNIT/ML ~~LOC~~ SOLN
25.0000 [IU] | Freq: Two times a day (BID) | SUBCUTANEOUS | Status: DC
  Administered 2016-08-29 – 2016-09-01 (×8): 25 [IU] via SUBCUTANEOUS
  Filled 2016-08-29 (×10): qty 0.25

## 2016-08-29 MED ORDER — INSULIN ASPART 100 UNIT/ML ~~LOC~~ SOLN
0.0000 [IU] | Freq: Three times a day (TID) | SUBCUTANEOUS | Status: DC
Start: 2016-08-29 — End: 2016-09-04
  Administered 2016-08-29: 3 [IU] via SUBCUTANEOUS
  Administered 2016-08-29: 2 [IU] via SUBCUTANEOUS
  Administered 2016-08-30 – 2016-09-01 (×5): 3 [IU] via SUBCUTANEOUS
  Administered 2016-09-01: 2 [IU] via SUBCUTANEOUS
  Administered 2016-09-02: 3 [IU] via SUBCUTANEOUS
  Administered 2016-09-02: 5 [IU] via SUBCUTANEOUS
  Administered 2016-09-03: 2 [IU] via SUBCUTANEOUS
  Administered 2016-09-03: 3 [IU] via SUBCUTANEOUS
  Administered 2016-09-04: 2 [IU] via SUBCUTANEOUS

## 2016-08-29 NOTE — Progress Notes (Signed)
Hypoglycemic Event  CBG: 45  Treatment: 15 GM carbohydrate snack  Symptoms: Hungry  Follow-up CBG: Time:0000 CBG Result:51  Treatment: another 15 GM carb snack  Follow up time 1245  CBG: 106  Possible Reasons for Event: Inadequate meal intake  Comments/MD notified:n/a hypoglycemia protocol followed     Rolla Plate

## 2016-08-29 NOTE — Progress Notes (Signed)
5 Days Post-Op Procedure(s) (LRB): CORONARY ARTERY BYPASS GRAFTING (CABG) x three,  using left internal mammary artery and right leg greater saphenous vein harvested endoscopically (N/A) TRANSESOPHAGEAL ECHOCARDIOGRAM (TEE) (N/A) CLIPPING OF ATRIAL APPENDAGE (N/A) Subjective: Feels better this AM, finally got a little sleep late last night  Objective: Vital signs in last 24 hours: Temp:  [97.3 F (36.3 C)-98.1 F (36.7 C)] 97.7 F (36.5 C) (11/11 0758) Pulse Rate:  [76-113] 86 (11/11 0900) Cardiac Rhythm: Normal sinus rhythm (11/11 0900) Resp:  [10-27] 13 (11/11 0900) BP: (87-140)/(57-117) 121/75 (11/11 0900) SpO2:  [90 %-100 %] 96 % (11/11 0900)  Hemodynamic parameters for last 24 hours: CVP:  [10 mmHg] 10 mmHg  Intake/Output from previous day: 11/10 0701 - 11/11 0700 In: 860.5 [P.O.:240; I.V.:520.5; IV Piggyback:100] Out: 2600 [Urine:2600] Intake/Output this shift: Total I/O In: 73.4 [I.V.:23.4; IV Piggyback:50] Out: -   General appearance: alert, cooperative and no distress Neurologic: intact Heart: regular rate and rhythm Lungs: diminished breath sounds bibasilar Abdomen: normal findings: soft, non-tender  Lab Results:  Recent Labs  08/28/16 0400 08/28/16 1731 08/29/16 0350  WBC 9.4  --  6.4  HGB 8.0* 8.5* 8.6*  HCT 23.3* 25.0* 25.6*  PLT 88*  --  125*   BMET:  Recent Labs  08/28/16 0400 08/28/16 1731 08/29/16 0350  NA 130* 133* 136  K 3.3* 4.7 3.3*  CL 91* 88* 95*  CO2 30  --  32  GLUCOSE 198* 224* 164*  BUN 20 20 17   CREATININE 1.18 1.40* 1.02  CALCIUM 8.0*  --  8.3*    PT/INR:  Recent Labs  08/29/16 0350  LABPROT 16.6*  INR 1.33   ABG    Component Value Date/Time   PHART 7.311 (L) 08/26/2016 1950   HCO3 24.9 08/26/2016 1950   TCO2 30 08/28/2016 1731   ACIDBASEDEF 2.0 08/26/2016 1950   O2SAT 45.3 08/29/2016 0345   CBG (last 3)   Recent Labs  08/29/16 0049 08/29/16 0331 08/29/16 0755  GLUCAP 106* 152* 188*     Assessment/Plan: S/P Procedure(s) (LRB): CORONARY ARTERY BYPASS GRAFTING (CABG) x three,  using left internal mammary artery and right leg greater saphenous vein harvested endoscopically (N/A) TRANSESOPHAGEAL ECHOCARDIOGRAM (TEE) (N/A) CLIPPING OF ATRIAL APPENDAGE (N/A) -  CV- still on dopamine and milrinone- co-ox down slightly - follow  In SR on PO amiodarone  On warfarin  Decrease ASA to 81 mg  RESP_ continue IS  RENAL- creatinine better this AM  Hypokalemia- supplement  ENDO- CBG labile, hypoglycemic last night, decrease levemir and add meal coverage  Deconditioning- mobilize as tolerated   LOS: 5 days    [name] 08/29/2016

## 2016-08-29 NOTE — Progress Notes (Signed)
      [address]       [place] [address]             [phone]      Stable day  On room air   BP 111/78 (BP Location: Right Arm)   Pulse (!) 103   Temp 98.1 F (36.7 C) (Oral)   Resp (!) 23   Ht 5\' 6"  (1.676 m)   Wt 175 lb 11.3 oz (79.7 kg)   SpO2 95%   BMI 28.36 kg/m    Intake/Output Summary (Last 24 hours) at 08/29/16 1751 Last data filed at 08/29/16 1100  Gross per 24 hour  Intake            540.9 ml  Output             1800 ml  Net          -1259.1 ml   Continues to diurese well  No new issues  Remo Lipps C. Roxan Hockey, MD [facility] [phone]

## 2016-08-30 ENCOUNTER — Inpatient Hospital Stay (HOSPITAL_COMMUNITY): Payer: BLUE CROSS/BLUE SHIELD

## 2016-08-30 LAB — POCT I-STAT 4, (NA,K, GLUC, HGB,HCT)
Glucose, Bld: 183 mg/dL — ABNORMAL HIGH (ref 65–99)
HCT: 31 % — ABNORMAL LOW (ref 39.0–52.0)
Hemoglobin: 10.5 g/dL — ABNORMAL LOW (ref 13.0–17.0)
Potassium: 3.4 mmol/L — ABNORMAL LOW (ref 3.5–5.1)
Sodium: 137 mmol/L (ref 135–145)

## 2016-08-30 LAB — GLUCOSE, CAPILLARY
Glucose-Capillary: 131 mg/dL — ABNORMAL HIGH (ref 65–99)
Glucose-Capillary: 154 mg/dL — ABNORMAL HIGH (ref 65–99)
Glucose-Capillary: 156 mg/dL — ABNORMAL HIGH (ref 65–99)

## 2016-08-30 LAB — COMPREHENSIVE METABOLIC PANEL
ALT: 15 U/L — ABNORMAL LOW (ref 17–63)
AST: 24 U/L (ref 15–41)
Albumin: 2.8 g/dL — ABNORMAL LOW (ref 3.5–5.0)
Alkaline Phosphatase: 72 U/L (ref 38–126)
Anion gap: 10 (ref 5–15)
BUN: 12 mg/dL (ref 6–20)
CO2: 34 mmol/L — ABNORMAL HIGH (ref 22–32)
Calcium: 8.4 mg/dL — ABNORMAL LOW (ref 8.9–10.3)
Chloride: 92 mmol/L — ABNORMAL LOW (ref 101–111)
Creatinine, Ser: 1.14 mg/dL (ref 0.61–1.24)
GFR calc Af Amer: >60 mL/min (ref >60)
GFR calc non Af Amer: >60 mL/min (ref >60)
Glucose, Bld: 158 mg/dL — ABNORMAL HIGH (ref 65–99)
Potassium: 3 mmol/L — ABNORMAL LOW (ref 3.5–5.1)
Sodium: 136 mmol/L (ref 135–145)
Total Bilirubin: 0.7 mg/dL (ref 0.3–1.2)
Total Protein: 5.8 g/dL — ABNORMAL LOW (ref 6.5–8.1)

## 2016-08-30 LAB — CBC
HCT: 25.7 % — ABNORMAL LOW (ref 39.0–52.0)
Hemoglobin: 8.6 g/dL — ABNORMAL LOW (ref 13.0–17.0)
MCH: 28 pg (ref 26.0–34.0)
MCHC: 33.5 g/dL (ref 30.0–36.0)
MCV: 83.7 fL (ref 78.0–100.0)
Platelets: 165 10*3/uL (ref 150–400)
RBC: 3.07 MIL/uL — ABNORMAL LOW (ref 4.22–5.81)
RDW: 14.9 % (ref 11.5–15.5)
WBC: 6.8 10*3/uL (ref 4.0–10.5)

## 2016-08-30 LAB — COOXEMETRY PANEL
Carboxyhemoglobin: 1.2 % (ref 0.5–1.5)
Methemoglobin: 1 % (ref 0.0–1.5)
O2 Saturation: 49.8 %
Total hemoglobin: 9.3 g/dL — ABNORMAL LOW (ref 12.0–16.0)

## 2016-08-30 LAB — PROTIME-INR
INR: 1.51
Prothrombin Time: 18.4 seconds — ABNORMAL HIGH (ref 11.4–15.2)

## 2016-08-30 MED ORDER — POTASSIUM CHLORIDE 10 MEQ/50ML IV SOLN
10.0000 meq | INTRAVENOUS | Status: AC
  Administered 2016-08-30 (×2): 10 meq via INTRAVENOUS

## 2016-08-30 MED ORDER — POTASSIUM CHLORIDE 10 MEQ/50ML IV SOLN
10.0000 meq | INTRAVENOUS | Status: AC
  Administered 2016-08-30 (×4): 10 meq via INTRAVENOUS

## 2016-08-30 MED ORDER — POTASSIUM CHLORIDE CRYS ER 20 MEQ PO TBCR
40.0000 meq | EXTENDED_RELEASE_TABLET | Freq: Two times a day (BID) | ORAL | Status: DC
  Administered 2016-08-30 – 2016-09-04 (×11): 40 meq via ORAL
  Filled 2016-08-30 (×11): qty 2

## 2016-08-30 MED ORDER — POTASSIUM CHLORIDE 10 MEQ/50ML IV SOLN
10.0000 meq | INTRAVENOUS | Status: AC
  Administered 2016-08-30 (×3): 10 meq via INTRAVENOUS
  Filled 2016-08-30 (×3): qty 50

## 2016-08-30 MED ORDER — POTASSIUM CHLORIDE 10 MEQ/50ML IV SOLN
INTRAVENOUS | Status: AC
  Filled 2016-08-30: qty 100

## 2016-08-30 MED ORDER — POTASSIUM CHLORIDE 10 MEQ/50ML IV SOLN
INTRAVENOUS | Status: AC
  Filled 2016-08-30: qty 200

## 2016-08-30 NOTE — Progress Notes (Signed)
K+= 3.0 and creat= 1.14 w/ urine o/p > 30cc/hr; TCTS KCL protocol initiated with 10 mEq KCL in 50cc IV x 3, each over one hour.

## 2016-08-30 NOTE — Progress Notes (Signed)
      [address]       [place] [address]             [phone]      C/o leg cramps  BP 113/62   Pulse 96   Temp 98.6 F (37 C) (Oral)   Resp 16   Ht 5\' 6"  (1.676 m)   Wt 166 lb 0.1 oz (75.3 kg)   SpO2 94%   BMI 26.79 kg/m    Intake/Output Summary (Last 24 hours) at 08/30/16 1759 Last data filed at 08/30/16 1200  Gross per 24 hour  Intake           1662.3 ml  Output             4850 ml  Net          -3187.7 ml    Continues to have large volume diuresis  Will recheck K  Harris Penton C. Roxan Hockey, MD [facility] [phone]

## 2016-08-30 NOTE — Progress Notes (Signed)
Dr. Roxan Hockey notified of second and third toe of right foot with discoloration.  Pt states the "blisters popped up" 1 week ago.  No orders received.  Will continue to closely monitor.

## 2016-08-30 NOTE — Progress Notes (Signed)
6 Days Post-Op Procedure(s) (LRB): CORONARY ARTERY BYPASS GRAFTING (CABG) x three,  using left internal mammary artery and right leg greater saphenous vein harvested endoscopically (N/A) TRANSESOPHAGEAL ECHOCARDIOGRAM (TEE) (N/A) CLIPPING OF ATRIAL APPENDAGE (N/A) Subjective: Had trouble sleeping last night  Objective: Vital signs in last 24 hours: Temp:  [98.1 F (36.7 C)-98.6 F (37 C)] 98.2 F (36.8 C) (11/12 0751) Pulse Rate:  [64-107] 95 (11/12 0800) Cardiac Rhythm: Normal sinus rhythm (11/11 2000) Resp:  [13-24] 18 (11/12 0800) BP: (94-136)/(51-85) 124/85 (11/12 0800) SpO2:  [91 %-100 %] 96 % (11/12 0800) Weight:  [166 lb 0.1 oz (75.3 kg)] 166 lb 0.1 oz (75.3 kg) (11/12 0400)  Hemodynamic parameters for last 24 hours:    Intake/Output from previous day: 11/11 0701 - 11/12 0700 In: 2130.8 [P.O.:1380; I.V.:450.8; IV Piggyback:300] Out: 5050 [Urine:5050] Intake/Output this shift: No intake/output data recorded.  General appearance: alert, cooperative and no distress Neurologic: intact Heart: regular rate and rhythm Lungs: diminished breath sounds bibasilar Wound: clean and dry  Lab Results:  Recent Labs  08/29/16 0350 08/30/16 0348  WBC 6.4 6.8  HGB 8.6* 8.6*  HCT 25.6* 25.7*  PLT 125* 165   BMET:  Recent Labs  08/29/16 0350 08/30/16 0348  NA 136 136  K 3.3* 3.0*  CL 95* 92*  CO2 32 34*  GLUCOSE 164* 158*  BUN 17 12  CREATININE 1.02 1.14  CALCIUM 8.3* 8.4*    PT/INR:  Recent Labs  08/30/16 0348  LABPROT 18.4*  INR 1.51   ABG    Component Value Date/Time   PHART 7.311 (L) 08/26/2016 1950   HCO3 24.9 08/26/2016 1950   TCO2 30 08/28/2016 1731   ACIDBASEDEF 2.0 08/26/2016 1950   O2SAT 49.8 08/30/2016 0342   CBG (last 3)   Recent Labs  08/29/16 2204 08/29/16 2347 08/30/16 0749  GLUCAP 141* 95 131*    Assessment/Plan: S/P Procedure(s) (LRB): CORONARY ARTERY BYPASS GRAFTING (CABG) x three,  using left internal mammary artery and  right leg greater saphenous vein harvested endoscopically (N/A) TRANSESOPHAGEAL ECHOCARDIOGRAM (TEE) (N/A) CLIPPING OF ATRIAL APPENDAGE (N/A) -CV- remains on dopamine and milrinone, co-ox better at 49 but still low  Warfarin, INR up slightly to 1.5  Maintaining SR on amiodarone  RESP- mild left lower lobe atelectasis on CXR- continue IS  RENAL- creatinine OK, diuresing well  Hypokalemia persists- IV and PO K  ENDO- no hypoglycemia overnight  Deconditioning improving- ambulated 400' yesterday   LOS: 6 days    Melrose Nakayama 08/30/2016

## 2016-08-31 LAB — TYPE AND SCREEN
ABO/RH(D): O NEG
Antibody Screen: NEGATIVE
Unit division: 0
Unit division: 0
Unit division: 0
Unit division: 0

## 2016-08-31 LAB — COMPREHENSIVE METABOLIC PANEL
ALT: 16 U/L — ABNORMAL LOW (ref 17–63)
AST: 25 U/L (ref 15–41)
Albumin: 3 g/dL — ABNORMAL LOW (ref 3.5–5.0)
Alkaline Phosphatase: 79 U/L (ref 38–126)
Anion gap: 12 (ref 5–15)
BUN: 16 mg/dL (ref 6–20)
CO2: 31 mmol/L (ref 22–32)
Calcium: 8.7 mg/dL — ABNORMAL LOW (ref 8.9–10.3)
Chloride: 96 mmol/L — ABNORMAL LOW (ref 101–111)
Creatinine, Ser: 1.21 mg/dL (ref 0.61–1.24)
GFR calc Af Amer: >60 mL/min (ref >60)
GFR calc non Af Amer: >60 mL/min (ref >60)
Glucose, Bld: 182 mg/dL — ABNORMAL HIGH (ref 65–99)
Potassium: 3.7 mmol/L (ref 3.5–5.1)
Sodium: 139 mmol/L (ref 135–145)
Total Bilirubin: 0.8 mg/dL (ref 0.3–1.2)
Total Protein: 6.4 g/dL — ABNORMAL LOW (ref 6.5–8.1)

## 2016-08-31 LAB — CBC
HCT: 29.1 % — ABNORMAL LOW (ref 39.0–52.0)
Hemoglobin: 9.5 g/dL — ABNORMAL LOW (ref 13.0–17.0)
MCH: 27.6 pg (ref 26.0–34.0)
MCHC: 32.6 g/dL (ref 30.0–36.0)
MCV: 84.6 fL (ref 78.0–100.0)
Platelets: 243 10*3/uL (ref 150–400)
RBC: 3.44 MIL/uL — ABNORMAL LOW (ref 4.22–5.81)
RDW: 14.7 % (ref 11.5–15.5)
WBC: 10.6 10*3/uL — ABNORMAL HIGH (ref 4.0–10.5)

## 2016-08-31 LAB — GLUCOSE, CAPILLARY
Glucose-Capillary: 178 mg/dL — ABNORMAL HIGH (ref 65–99)
Glucose-Capillary: 199 mg/dL — ABNORMAL HIGH (ref 65–99)
Glucose-Capillary: 81 mg/dL (ref 65–99)
Glucose-Capillary: 220 mg/dL — ABNORMAL HIGH (ref 65–99)

## 2016-08-31 LAB — COOXEMETRY PANEL
Carboxyhemoglobin: 1.6 % — ABNORMAL HIGH (ref 0.5–1.5)
Methemoglobin: 0.9 % (ref 0.0–1.5)
O2 Saturation: 50.9 %
Total hemoglobin: 10.3 g/dL — ABNORMAL LOW (ref 12.0–16.0)

## 2016-08-31 LAB — PROTIME-INR
INR: 1.35
Prothrombin Time: 16.8 seconds — ABNORMAL HIGH (ref 11.4–15.2)

## 2016-08-31 MED ORDER — FUROSEMIDE 40 MG PO TABS
40.0000 mg | ORAL_TABLET | Freq: Every day | ORAL | Status: DC
  Administered 2016-09-01 – 2016-09-04 (×4): 40 mg via ORAL
  Filled 2016-08-31 (×4): qty 1

## 2016-08-31 MED ORDER — DIGOXIN 125 MCG PO TABS
0.1250 mg | ORAL_TABLET | Freq: Every day | ORAL | Status: DC
  Administered 2016-08-31 – 2016-09-04 (×5): 0.125 mg via ORAL
  Filled 2016-08-31 (×5): qty 1

## 2016-08-31 MED ORDER — SODIUM CHLORIDE 0.9 % IV SOLN
250.0000 mL | INTRAVENOUS | Status: DC | PRN
  Administered 2016-08-31 – 2016-09-01 (×2): 250 mL via INTRAVENOUS

## 2016-08-31 MED ORDER — MAGNESIUM HYDROXIDE 400 MG/5ML PO SUSP: 30.0000 mL | Freq: Every day | ORAL | Status: DC | PRN

## 2016-08-31 MED ORDER — SODIUM CHLORIDE 0.9% FLUSH: 3.0000 mL | INTRAVENOUS | Status: DC | PRN

## 2016-08-31 MED ORDER — POTASSIUM CHLORIDE 10 MEQ/50ML IV SOLN
10.0000 meq | INTRAVENOUS | Status: AC
  Administered 2016-08-31 (×3): 10 meq via INTRAVENOUS
  Filled 2016-08-31 (×4): qty 50

## 2016-08-31 MED ORDER — SORBITOL 70 % SOLN
45.0000 mL | Freq: Every day | Status: AC | PRN
Start: 2016-08-31 — End: 2016-08-31
  Administered 2016-08-31: 45 mL via ORAL
  Filled 2016-08-31: qty 60

## 2016-08-31 MED ORDER — WARFARIN SODIUM 4 MG PO TABS
4.0000 mg | ORAL_TABLET | Freq: Every day | ORAL | Status: DC
  Administered 2016-08-31: 4 mg via ORAL
  Filled 2016-08-31: qty 1

## 2016-08-31 MED ORDER — SODIUM CHLORIDE 0.9% FLUSH
3.0000 mL | Freq: Two times a day (BID) | INTRAVENOUS | Status: DC
  Administered 2016-08-31 – 2016-09-03 (×4): 3 mL via INTRAVENOUS

## 2016-08-31 MED ORDER — MOVING RIGHT ALONG BOOK
Freq: Once | Status: AC
  Administered 2016-08-31: 10:00:00
  Filled 2016-08-31: qty 1

## 2016-08-31 MED ORDER — GUAIFENESIN ER 600 MG PO TB12
600.0000 mg | ORAL_TABLET | Freq: Two times a day (BID) | ORAL | Status: DC
Start: 2016-08-31 — End: 2016-09-04
  Administered 2016-08-31 – 2016-09-04 (×8): 600 mg via ORAL
  Filled 2016-08-31 (×8): qty 1

## 2016-08-31 NOTE — Progress Notes (Signed)
7 Days Post-Op Procedure(s) (LRB): CORONARY ARTERY BYPASS GRAFTING (CABG) x three,  using left internal mammary artery and right leg greater saphenous vein harvested endoscopically (N/A) TRANSESOPHAGEAL ECHOCARDIOGRAM (TEE) (N/A) CLIPPING OF ATRIAL APPENDAGE (N/A) Subjective: Excellent diuresis maintaining nsr Ready for tx to stepdown Plan slow milrinone wean over next 3 days  Objective: Vital signs in last 24 hours: Temp:  [98.1 F (36.7 C)-98.9 F (37.2 C)] 98.1 F (36.7 C) (11/13 0738) Pulse Rate:  [89-102] 95 (11/13 0800) Cardiac Rhythm: Normal sinus rhythm (11/13 0800) Resp:  [13-26] 20 (11/13 0800) BP: (85-120)/(51-90) 94/71 (11/13 0800) SpO2:  [90 %-100 %] 100 % (11/13 0800) Weight:  [157 lb 6.5 oz (71.4 kg)] 157 lb 6.5 oz (71.4 kg) (11/13 0500)  Hemodynamic parameters for last 24 hours:    Intake/Output from previous day: 11/12 0701 - 11/13 0700 In: 1240.8 [P.O.:600; I.V.:390.8; IV Piggyback:250] Out: [id number] [Urine:7850] Intake/Output this shift: Total I/O In: 71.9 [I.V.:21.9; IV Piggyback:50] Out: 350 [Urine:350]       Exam    General- alert and comfortable   Lungs- clear without rales, wheezes   Cor- regular rate and rhythm, no murmur , gallop   Abdomen- soft, non-tender   Extremities - warm, non-tender, minimal edema   Neuro- oriented, appropriate, no focal weakness   Lab Results:  Recent Labs  08/30/16 0348 08/30/16 1803 08/31/16 0505  WBC 6.8  --  10.6*  HGB 8.6* 10.5* 9.5*  HCT 25.7* 31.0* 29.1*  PLT 165  --  243   BMET:  Recent Labs  08/30/16 0348 08/30/16 1803 08/31/16 0505  NA 136 137 139  K 3.0* 3.4* 3.7  CL 92*  --  96*  CO2 34*  --  31  GLUCOSE 158* 183* 182*  BUN 12  --  16  CREATININE 1.14  --  1.21  CALCIUM 8.4*  --  8.7*    PT/INR:  Recent Labs  08/31/16 0505  LABPROT 16.8*  INR 1.35   ABG    Component Value Date/Time   PHART 7.311 (L) 08/26/2016 1950   HCO3 24.9 08/26/2016 1950   TCO2 30 08/28/2016 1731   ACIDBASEDEF 2.0 08/26/2016 1950   O2SAT 50.9 08/31/2016 0454   CBG (last 3)   Recent Labs  08/30/16 1115 08/30/16 2105 08/31/16 0734  GLUCAP 154* 156* 178*    Assessment/Plan: S/P Procedure(s) (LRB): CORONARY ARTERY BYPASS GRAFTING (CABG) x three,  using left internal mammary artery and right leg greater saphenous vein harvested endoscopically (N/A) TRANSESOPHAGEAL ECHOCARDIOGRAM (TEE) (N/A) CLIPPING OF ATRIAL APPENDAGE (N/A) Mobilize Diabetes control Plan for transfer to step-down: see transfer orders cont coumadin for hx PAF   LOS: 7 days    [name] 08/31/2016

## 2016-08-31 NOTE — Discharge Instructions (Addendum)
Coronary Artery Bypass Grafting  Endoscopic Saphenous Vein Harvesting, Care After Introduction Refer to this sheet in the next few weeks. These instructions provide you with information about caring for yourself after your procedure. Your health care provider may also give you more specific instructions. Your treatment has been planned according to current medical practices, but problems sometimes occur. Call your health care provider if you have any problems or questions after your procedure. What can I expect after the procedure? After the procedure, it is common to have:  Pain.  Bruising.  Swelling.  Numbness. Follow these instructions at home: Medicine  Take over-the-counter and prescription medicines only as told by your health care provider.  Do not drive or operate heavy machinery while taking prescription pain medicine. Incision care  Follow instructions from your health care provider about how to take care of the cut made during surgery (incision). Make sure you:  Wash your hands with soap and water before you change your bandage (dressing). If soap and water are not available, use hand sanitizer.  Change your dressing as told by your health care provider.  Leave stitches (sutures), skin glue, or adhesive strips in place. These skin closures may need to be in place for 2 weeks or longer. If adhesive strip edges start to loosen and curl up, you may trim the loose edges. Do not remove adhesive strips completely unless your health care provider tells you to do that.  Check your incision area every day for signs of infection. Check for:  More redness, swelling, or pain.  More fluid or blood.  Warmth.  Pus or a bad smell. General instructions  Raise (elevate) your legs above the level of your heart while you are sitting or lying down.  Do any exercises your health care providers have given you. These may include deep breathing, coughing, and walking exercises.  Do  not shower, take baths, swim, or use a hot tub unless told by your health care provider.  Wear your elastic stocking if told by your health care provider.  Keep all follow-up visits as told by your health care provider. This is important. Contact a health care provider if:  Medicine does not help your pain.  Your pain gets worse.  You have new leg bruises or your leg bruises get bigger.  You have a fever.  Your leg feels numb.  You have more redness, swelling, or pain around your incision.  You have more fluid or blood coming from your incision.  Your incision feels warm to the touch.  You have pus or a bad smell coming from your incision. Get help right away if:  Your pain is severe.  You develop pain, tenderness, warmth, redness, or swelling in any part of your leg.  You have chest pain.  You have trouble breathing. This information is not intended to replace advice given to you by your health care provider. Make sure you discuss any questions you have with your health care provider. Document Released: 06/17/2011 Document Revised: 03/12/2016 Document Reviewed: 08/19/2015  2017 Elsevier Coronary artery bypass grafting (CABG) is a surgery that is done when arteries of the heart have become narrow or blocked with a fatty, waxy buildup. These arteries supply the heart with oxygen and nutrients it needs to pump blood to your body. During CABG, a section of blood vessel from anotCoronary Artery Bypass Grafting, Care After Refer to this sheet in the next few weeks. These instructions provide you with information on caring for yourself  after your procedure. Your health care provider may also give you more specific instructions. Your treatment has been planned according to current medical practices, but problems sometimes occur. Call your health care provider if you have any problems or questions after your procedure. WHAT TO EXPECT AFTER THE PROCEDURE Recovery from surgery will be  different for everyone. Some people feel well after 3 or 4 weeks, while for others it takes longer. After your procedure, it is typical to have the following:  Nausea and a lack of appetite.   Constipation.  Weakness and fatigue.   Depression or irritability.   Pain or discomfort at your incision site. HOME CARE INSTRUCTIONS  Take medicines only as directed by your health care provider. Do not stop taking medicines or start any new medicines without first checking with your health care provider.  Take your pulse as directed by your health care provider.  Perform deep breathing as directed by your health care provider. If you were given a device called an incentive spirometer, use it to practice deep breathing several times a day. Support your chest with a pillow or your arms when you take deep breaths or cough.  Keep incision areas clean, dry, and protected. Remove or change any bandages (dressings) only as directed by your health care provider. You may have skin adhesive strips over the incision areas. Do not take the strips off. They will fall off on their own.  Check incision areas daily for any swelling, redness, or drainage.  If incisions were made in your legs, do the following:  Avoid crossing your legs.   Avoid sitting for long periods of time. Change positions every 30 minutes.   Elevate your legs when you are sitting.  Wear compression stockings as directed by your health care provider. These stockings help keep blood clots from forming in your legs.  Take showers once your health care provider approves. Until then, only take sponge baths. Pat incisions dry. Do not rub incisions with a washcloth or towel. Do not take baths, swim, or use a hot tub until your health care provider approves.  Eat foods that are high in fiber, such as raw fruits and vegetables, whole grains, beans, and nuts. Meats should be lean cut. Avoid canned, processed, and fried foods.  Drink  enough fluid to keep your urine clear or pale yellow.  Weigh yourself every day. This helps identify if you are retaining fluid that may make your heart and lungs work harder.  Rest and limit activity as directed by your health care provider. You may be instructed to:  Stop any activity at once if you have chest pain, shortness of breath, irregular heartbeats, or dizziness. Get help right away if you have any of these symptoms.  Move around frequently for short periods or take short walks as directed by your health care provider. Increase your activities gradually. You may need physical therapy or cardiac rehabilitation to help strengthen your muscles and build your endurance.  Avoid lifting, pushing, or pulling anything heavier than 10 lb (4.5 kg) for at least 6 weeks after surgery.  Do not drive until your health care provider approves.  Ask your health care provider when you may return to work.  Ask your health care provider when you may resume sexual activity.  Keep all follow-up visits as directed by your health care provider. This is important. SEEK MEDICAL CARE IF:  You have swelling, redness, increasing pain, or drainage at the site of an incision.  You have a fever.  You have swelling in your ankles or legs.  You have pain in your legs.   You gain 2 or more pounds (0.9 kg) a day.  You are nauseous or vomit.  You have diarrhea. SEEK IMMEDIATE MEDICAL CARE IF:  You have chest pain that goes to your jaw or arms.  You have shortness of breath.   You have a fast or irregular heartbeat.   You notice a "clicking" in your breastbone (sternum) when you move.   You have numbness or weakness in your arms or legs.  You feel dizzy or light-headed.  MAKE SURE YOU:  Understand these instructions.  Will watch your condition.  Will get help right away if you are not doing well or get worse. This information is not intended to replace advice given to you by your  health care provider. Make sure you discuss any questions you have with your health care provider. Document Released: 04/24/2005 Document Revised: 10/26/2014 Document Reviewed: 03/14/2013 Elsevier Interactive Patient Education  2017 [employer]. her part of the body is taken and placed where there is narrowing or blocking. What happens before the procedure?  Take all medicines as told by your doctor. You may be asked to start new medicines or stop others. Do not stop medicines or take different amounts on your own.  Do not eat or drink anything after midnight on the night before the surgery or as told by your doctor. Ask your doctor if it is okay to take a sip of water with any needed medicines. What happens during the procedure? There are two ways of performing this surgery. Traditional open surgery  You will be given medicine to make you sleep through the surgery (general anesthetic).  Once you are sleeping, a cut (incision) is made down the front of the chest through your breastbone. Your breastbone is spread open so your heart can be seen.  You are then placed on a heart-lung bypass machine. This machine gives oxygen to your blood while the heart is being worked on.  Your heart is then stopped for a short amount of time. This is so Engineer, production can do the next steps.  Part of a leg vein may be taken and used to go around (bypass) the blocked heart arteries. Sometimes parts of an artery from inside your chest wall or from your arm are used.  When the bypass is done, you are taken off the machine.  Your heart is started again. It will start pumping as it once did.  The sac around your heart is closed.  Your chest is closed with stitches or staples.  You will have tubes in your chest. These tubes are connected to a suction device. This device will drain fluid and puff up your lungs again. Minimally invasive surgery  This surgery is done from a cut that is made on the left side of  your chest. If needed, your surgeon may not have to slow or stop your heart. What happens after the procedure?  You will be taken to a recovery area to be watched.  You may wake up with a tube in your throat that helps you breathe. You may be connected to a breathing machine. You will not be able to talk when the tube is in. The tube is taken out when it is safe.  You may be groggy and have some pain. You will be given medicine to help the pain. This information is not intended  to replace advice given to you by your health care provider. Make sure you discuss any questions you have with your health care provider. Document Released: 10/10/2013 Document Revised: 03/12/2016 Document Reviewed: 03/14/2013 Elsevier Interactive Patient Education  2017 [employer] on my medicine - Coumadin   (Warfarin)  This medication education was reviewed with me or my healthcare representative as part of my discharge preparation.  Why was Coumadin prescribed for you? Coumadin was prescribed for you because you have a blood clot or a medical condition that can cause an increased risk of forming blood clots. Blood clots can cause serious health problems by blocking the flow of blood to the heart, lung, or brain. Coumadin can prevent harmful blood clots from forming. As a reminder your indication for Coumadin is:   Stroke Prevention Because Of Atrial Fibrillation  What test will check on my response to Coumadin? While on Coumadin (warfarin) you will need to have an INR test regularly to ensure that your dose is keeping you in the desired range. The INR (international normalized ratio) number is calculated from the result of the laboratory test called prothrombin time (PT).  If an INR APPOINTMENT HAS NOT ALREADY BEEN MADE FOR YOU please schedule an appointment to have this lab work done by your health care provider within 7 days. Your INR goal is usually a number between:  2 to 3 or your provider may  give you a more narrow range like 2-2.5.  Ask your health care provider during an office visit what your goal INR is.  What  do you need to  know  About  COUMADIN? Take Coumadin (warfarin) exactly as prescribed by your healthcare provider about the same time each day.  DO NOT stop taking without talking to the doctor who prescribed the medication.  Stopping without other blood clot prevention medication to take the place of Coumadin may increase your risk of developing a new clot or stroke.  Get refills before you run out.  What do you do if you miss a dose? If you miss a dose, take it as soon as you remember on the same day then continue your regularly scheduled regimen the next day.  Do not take two doses of Coumadin at the same time.  Important Safety Information A possible side effect of Coumadin (Warfarin) is an increased risk of bleeding. You should call your healthcare provider right away if you experience any of the following: ? Bleeding from an injury or your nose that does not stop. ? Unusual colored urine (red or dark brown) or unusual colored stools (red or black). ? Unusual bruising for unknown reasons. ? A serious fall or if you hit your head (even if there is no bleeding).  Some foods or medicines interact with Coumadin (warfarin) and might alter your response to warfarin. To help avoid this: ? Eat a balanced diet, maintaining a consistent amount of Vitamin K. ? Notify your provider about major diet changes you plan to make. ? Avoid alcohol or limit your intake to 1 drink for women and 2 drinks for men per day. (1 drink is 5 oz. wine, 12 oz. beer, or 1.5 oz. liquor.)  Make sure that ANY health care provider who prescribes medication for you knows that you are taking Coumadin (warfarin).  Also make sure the healthcare provider who is monitoring your Coumadin knows when you have started a new medication including herbals and non-prescription products.  Coumadin (Warfarin)  Major  Drug Interactions  Increased Warfarin Effect Decreased Warfarin Effect  Alcohol (large quantities) Antibiotics (esp. Septra/Bactrim, Flagyl, Cipro) Amiodarone (Cordarone) Aspirin (ASA) Cimetidine (Tagamet) Megestrol (Megace) NSAIDs (ibuprofen, naproxen, etc.) Piroxicam (Feldene) Propafenone (Rythmol SR) Propranolol (Inderal) Isoniazid (INH) Posaconazole (Noxafil) Barbiturates (Phenobarbital) Carbamazepine (Tegretol) Chlordiazepoxide (Librium) Cholestyramine (Questran) Griseofulvin Oral Contraceptives Rifampin Sucralfate (Carafate) Vitamin K   Coumadin (Warfarin) Major Herbal Interactions  Increased Warfarin Effect Decreased Warfarin Effect  Garlic Ginseng Ginkgo biloba Coenzyme Q10 Green tea St. Johns wort    Coumadin (Warfarin) FOOD Interactions  Eat a consistent number of servings per week of foods HIGH in Vitamin K (1 serving =  cup)  Collards (cooked, or boiled & drained) Kale (cooked, or boiled & drained) Mustard greens (cooked, or boiled & drained) Parsley *serving size only =  cup Spinach (cooked, or boiled & drained) Swiss chard (cooked, or boiled & drained) Turnip greens (cooked, or boiled & drained)  Eat a consistent number of servings per week of foods MEDIUM-HIGH in Vitamin K (1 serving = 1 cup)  Asparagus (cooked, or boiled & drained) Broccoli (cooked, boiled & drained, or raw & chopped) Brussel sprouts (cooked, or boiled & drained) *serving size only =  cup Lettuce, raw (green leaf, endive, romaine) Spinach, raw Turnip greens, raw & chopped   These websites have more information on Coumadin (warfarin):  FailFactory.se; VeganReport.com.au;

## 2016-08-31 NOTE — Progress Notes (Signed)
Nursing note Patient ambulated in hallway with family. Will monitor patient. Virlee Stroschein, Bettina Gavia RN

## 2016-09-01 ENCOUNTER — Inpatient Hospital Stay (HOSPITAL_COMMUNITY): Payer: BLUE CROSS/BLUE SHIELD

## 2016-09-01 DIAGNOSIS — I10 Essential (primary) hypertension: Secondary | ICD-10-CM

## 2016-09-01 LAB — PROTIME-INR
INR: 1.32
Prothrombin Time: 16.5 seconds — ABNORMAL HIGH (ref 11.4–15.2)

## 2016-09-01 LAB — GLUCOSE, CAPILLARY
Glucose-Capillary: 102 mg/dL — ABNORMAL HIGH (ref 65–99)
Glucose-Capillary: 167 mg/dL — ABNORMAL HIGH (ref 65–99)
Glucose-Capillary: 135 mg/dL — ABNORMAL HIGH (ref 65–99)
Glucose-Capillary: 204 mg/dL — ABNORMAL HIGH (ref 65–99)

## 2016-09-01 LAB — CBC
HCT: 29 % — ABNORMAL LOW (ref 39.0–52.0)
Hemoglobin: 9.5 g/dL — ABNORMAL LOW (ref 13.0–17.0)
MCH: 27.9 pg (ref 26.0–34.0)
MCHC: 32.8 g/dL (ref 30.0–36.0)
MCV: 85 fL (ref 78.0–100.0)
Platelets: 288 10*3/uL (ref 150–400)
RBC: 3.41 MIL/uL — ABNORMAL LOW (ref 4.22–5.81)
RDW: 15 % (ref 11.5–15.5)
WBC: 12.7 10*3/uL — ABNORMAL HIGH (ref 4.0–10.5)

## 2016-09-01 LAB — ECHOCARDIOGRAM COMPLETE
Height: 66 in
Weight: 2550.28 oz

## 2016-09-01 LAB — BASIC METABOLIC PANEL
Anion gap: 12 (ref 5–15)
BUN: 27 mg/dL — ABNORMAL HIGH (ref 6–20)
CO2: 28 mmol/L (ref 22–32)
Calcium: 8.9 mg/dL (ref 8.9–10.3)
Chloride: 97 mmol/L — ABNORMAL LOW (ref 101–111)
Creatinine, Ser: 1.31 mg/dL — ABNORMAL HIGH (ref 0.61–1.24)
GFR calc Af Amer: >60 mL/min (ref >60)
GFR calc non Af Amer: 60 mL/min — ABNORMAL LOW (ref >60)
Glucose, Bld: 92 mg/dL (ref 65–99)
Potassium: 4.4 mmol/L (ref 3.5–5.1)
Sodium: 137 mmol/L (ref 135–145)

## 2016-09-01 MED ORDER — WARFARIN SODIUM 5 MG PO TABS
5.0000 mg | ORAL_TABLET | Freq: Every day | ORAL | Status: DC
  Administered 2016-09-01 – 2016-09-03 (×3): 5 mg via ORAL
  Filled 2016-09-01 (×3): qty 1

## 2016-09-01 NOTE — Progress Notes (Signed)
CARDIAC REHAB PHASE I   PRE:  Joined pt in hallway, unable to obtain pre-ambulation vitals  MODE:  Ambulation: 1480 ft   POST:  Rate/Rhythm: 95 SR  BP:  Sitting: 125/74         SaO2: 99 RA   Pt up in hallway ambulating, states he just began his walk (had attempted to walk with pt earlier-was getting bedside echo), pt agreeable to have cardiac rehab join him now. Pt ambulated 1480 ft on RA, IV, independent, steady gait, tolerated well with no complaints. Pt to chair after walk, call bell within reach. Will follow.  [id number] Lenna Sciara, RN, BSN 09/01/2016 11:16 AM

## 2016-09-01 NOTE — Progress Notes (Addendum)
[address]       ,[place] [address]             [phone]      8 Days Post-Op Procedure(s) (LRB): CORONARY ARTERY BYPASS GRAFTING (CABG) x three,  using left internal mammary artery and right leg greater saphenous vein harvested endoscopically (N/A) TRANSESOPHAGEAL ECHOCARDIOGRAM (TEE) (N/A) CLIPPING OF ATRIAL APPENDAGE (N/A) Subjective: Feels great. Has done quite a bit of walking already. BM last night.   Objective: Vital signs in last 24 hours: Temp:  [97.4 F (36.3 C)-100.2 F (37.9 C)] 98.5 F (36.9 C) (11/14 0513) Pulse Rate:  [85-96] 87 (11/14 0513) Cardiac Rhythm: Normal sinus rhythm (11/14 0733) Resp:  [17-18] 18 (11/14 0513) BP: (91-104)/(61-79) 104/63 (11/14 0513) SpO2:  [98 %-99 %] 98 % (11/14 0513) Weight:  [159 lb 6.3 oz (72.3 kg)] 159 lb 6.3 oz (72.3 kg) (11/14 0513)     Intake/Output from previous day: 11/13 0701 - 11/14 0700 In: 149.5 [I.V.:99.5; IV Piggyback:50] Out: 650 [Urine:650] Intake/Output this shift: No intake/output data recorded.  General appearance: alert, cooperative and no distress Heart: regular rate and rhythm Lungs: clear to auscultation bilaterally Abdomen: soft, non-tender; bowel sounds normal; no masses,  no organomegaly Extremities: extremities normal, atraumatic, no cyanosis or edema Wound: clean and dry without drainage  Lab Results:  Recent Labs  08/31/16 0505 09/01/16 0500  WBC 10.6* 12.7*  HGB 9.5* 9.5*  HCT 29.1* 29.0*  PLT 243 288   BMET:  Recent Labs  08/31/16 0505 09/01/16 0500  NA 139 137  K 3.7 4.4  CL 96* 97*  CO2 31 28  GLUCOSE 182* 92  BUN 16 27*  CREATININE 1.21 1.31*  CALCIUM 8.7* 8.9    PT/INR:  Recent Labs  09/01/16 0500  LABPROT 16.5*  INR 1.32   ABG    Component Value Date/Time   PHART 7.311 (L) 08/26/2016 1950   HCO3 24.9 08/26/2016 1950   TCO2 30 08/28/2016 1731   ACIDBASEDEF 2.0 08/26/2016 1950   O2SAT 50.9 08/31/2016 0454   CBG (last 3)    Recent Labs  08/31/16 1621 08/31/16 2044 09/01/16 0646  GLUCAP 81 220* 102*    Assessment/Plan: S/P Procedure(s) (LRB): CORONARY ARTERY BYPASS GRAFTING (CABG) x three,  using left internal mammary artery and right leg greater saphenous vein harvested endoscopically (N/A) TRANSESOPHAGEAL ECHOCARDIOGRAM (TEE) (N/A) CLIPPING OF ATRIAL APPENDAGE (N/A)  1. CV-good BP control. NSR in the 80s. H/o A. Fib, on Coumadin INR 1.32. Continue coumadin 4mg  daily. On BB and milrinone. EPW out.  2. Pulm- good oxygen saturation on room air. Encourage hourly Is. CXR showed L > R pleural effusions. 3. Renal-creatinine slowly trending up. Will continue Lasix for now. Weight up, however likely incorrect. Negative 6.6L 4. Endo- moderate control. Continue meal time coverage SSI 5. H and H stable, expected acute blood loss anemia  Plan: Continue coumadin, continue diuretics with good response. Slow wean of milrinone over the next few days.    LOS: 8 days    Elgie Collard 11/14/2017postop echo with improved LV fx post CABG                    EF 25 >>>>35%                    Weaning milrinone off prior to DC , prob Friday  CXR w/o edema. Effusions                      Increase coumadin to 5 mg

## 2016-09-02 LAB — GLUCOSE, CAPILLARY
Glucose-Capillary: 97 mg/dL (ref 65–99)
Glucose-Capillary: 211 mg/dL — ABNORMAL HIGH (ref 65–99)
Glucose-Capillary: 170 mg/dL — ABNORMAL HIGH (ref 65–99)
Glucose-Capillary: 100 mg/dL — ABNORMAL HIGH (ref 65–99)

## 2016-09-02 LAB — PROTIME-INR
INR: 1.59
Prothrombin Time: 19.1 seconds — ABNORMAL HIGH (ref 11.4–15.2)

## 2016-09-02 MED ORDER — PATIENT'S GUIDE TO USING COUMADIN BOOK
Freq: Once | Status: DC
  Filled 2016-09-02: qty 1

## 2016-09-02 MED ORDER — METFORMIN HCL 500 MG PO TABS
1000.0000 mg | ORAL_TABLET | Freq: Two times a day (BID) | ORAL | Status: DC
  Administered 2016-09-02 – 2016-09-04 (×4): 1000 mg via ORAL
  Filled 2016-09-02 (×4): qty 2

## 2016-09-02 MED ORDER — WARFARIN VIDEO: Freq: Once | Status: DC

## 2016-09-02 MED ORDER — GLIPIZIDE 10 MG PO TABS
10.0000 mg | ORAL_TABLET | Freq: Two times a day (BID) | ORAL | Status: DC
  Administered 2016-09-02 – 2016-09-03 (×2): 10 mg via ORAL
  Filled 2016-09-02 (×2): qty 1

## 2016-09-02 NOTE — Progress Notes (Signed)
[id number] Education completed with pt and daughter who voiced understanding. Stressed importance of sternal precautions, IS, ex ed and diet. Pt has gotten his A1C down from previous hospitalization. He and daughter have met with a dietitian and pt understands importance of watching sodium and carbs. Wrote down how to view discharge and Coumadin video and asked RN to have pharmacist see. Pt has questions re Coumadin. Discussed CRP 2 and will refer to [place]. Pt has been walking independently. Graylon Good RN BSN 09/02/2016 3:04 PM

## 2016-09-02 NOTE — Progress Notes (Addendum)
[address]       [place] [address]             [phone]      9 Days Post-Op Procedure(s) (LRB): CORONARY ARTERY BYPASS GRAFTING (CABG) x three,  using left internal mammary artery and right leg greater saphenous vein harvested endoscopically (N/A) TRANSESOPHAGEAL ECHOCARDIOGRAM (TEE) (N/A) CLIPPING OF ATRIAL APPENDAGE (N/A) Subjective: Feeling well   Objective: Vital signs in last 24 hours: Temp:  [97.3 F (36.3 C)-98.6 F (37 C)] 97.9 F (36.6 C) (11/15 0617) Pulse Rate:  [85-88] 85 (11/15 0617) Cardiac Rhythm: Normal sinus rhythm (11/15 0723) Resp:  [18-20] 18 (11/15 0617) BP: (103-121)/(67-95) 103/73 (11/15 0617) SpO2:  [99 %-100 %] 100 % (11/15 0617) Weight:  [161 lb 1.6 oz (73.1 kg)] 161 lb 1.6 oz (73.1 kg) (11/15 0617)  Hemodynamic parameters for last 24 hours:    Intake/Output from previous day: 11/14 0701 - 11/15 0700 In: 960 [P.O.:960] Out: -  Intake/Output this shift: Total I/O In: 240 [P.O.:240] Out: -   General appearance: alert, cooperative and no distress Heart: regular rate and rhythm Lungs: clear to auscultation bilaterally Abdomen: benign Extremities: + edema Wound: incis healing well  Lab Results:  Recent Labs  08/31/16 0505 09/01/16 0500  WBC 10.6* 12.7*  HGB 9.5* 9.5*  HCT 29.1* 29.0*  PLT 243 288   BMET:  Recent Labs  08/31/16 0505 09/01/16 0500  NA 139 137  K 3.7 4.4  CL 96* 97*  CO2 31 28  GLUCOSE 182* 92  BUN 16 27*  CREATININE 1.21 1.31*  CALCIUM 8.7* 8.9    PT/INR:  Recent Labs  09/02/16 0440  LABPROT 19.1*  INR 1.59   ABG    Component Value Date/Time   PHART 7.311 (L) 08/26/2016 1950   HCO3 24.9 08/26/2016 1950   TCO2 30 08/28/2016 1731   ACIDBASEDEF 2.0 08/26/2016 1950   O2SAT 50.9 08/31/2016 0454   CBG (last 3)   Recent Labs  09/01/16 1626 09/01/16 2050 09/02/16 0615  GLUCAP 135* 204* 97    Meds Scheduled Meds: . amiodarone  200 mg Oral BID  . aspirin  81 mg  Oral Daily  . atorvastatin  80 mg Oral QHS  . bisacodyl  10 mg Oral Daily   Or  . bisacodyl  10 mg Rectal Daily  . buPROPion  150 mg Oral Daily  . digoxin  0.125 mg Oral Daily  . docusate sodium  200 mg Oral Daily  . ferrous Q000111Q C-folic acid  1 capsule Oral BID PC  . furosemide  40 mg Oral Daily  . guaiFENesin  600 mg Oral BID  . insulin aspart  0-15 Units Subcutaneous TID WC  . insulin aspart  0-5 Units Subcutaneous QHS  . insulin aspart  3 Units Subcutaneous TID WC  . insulin detemir  25 Units Subcutaneous BID  . mouth rinse  15 mL Mouth Rinse BID  . metoprolol tartrate  12.5 mg Oral BID  . mupirocin cream   Topical BID  . pantoprazole  40 mg Oral Daily  . potassium chloride  40 mEq Oral BID  . simethicone  80 mg Oral QID  . sodium chloride flush  10-40 mL Intracatheter Q12H  . sodium chloride flush  3 mL Intravenous Q12H  . warfarin  5 mg Oral q1800  . Warfarin - Physician Dosing Inpatient   Does not apply q1800   Continuous Infusions: . milrinone 0.25 mcg/kg/min (09/01/16  1438)   PRN Meds:.sodium chloride, levalbuterol, magnesium hydroxide, metoprolol, ondansetron (ZOFRAN) IV, oxyCODONE, sodium chloride flush, sodium chloride flush, traMADol  Xrays Dg Chest 2 View  Result Date: 09/01/2016 CLINICAL DATA:  55 year old male status post CABG 8 days ago. Mild nonproductive cough with no fever. Initial encounter. EXAM: CHEST  2 VIEW 08/30/2016 and earlier. FINDINGS: PA and lateral views of the chest. Stable cardiac size and mediastinal contours. Right PICC line remains in place. Sequelae of CABG. Small left greater than right layering pleural effusions. No pulmonary edema. No consolidation. Mildly displaced left lateral second rib fracture is new since the preoperative study. No pneumothorax. IMPRESSION: 1. Small left greater than right pleural effusions. No other acute cardiopulmonary abnormality. 2. Acute mildly displaced left lateral second rib fracture. 3. Right  PICC line remains in place. Electronically Signed   By: Genevie Ann M.D.   On: 09/01/2016 07:24    Assessment/Plan: S/P Procedure(s) (LRB): CORONARY ARTERY BYPASS GRAFTING (CABG) x three,  using left internal mammary artery and right leg greater saphenous vein harvested endoscopically (N/A) TRANSESOPHAGEAL ECHOCARDIOGRAM (TEE) (N/A) CLIPPING OF ATRIAL APPENDAGE (N/A)  1 doing well, still on milrinone-  decrease to 1.25 2 H/O afib conts coumadin- as on Eliquis previously, on amio/B-Blocker 3 restart oral DM meds and d/c insulin- had discussion about dietary management and he is very motivated 4 cont lasix for volume overload 5 routine pulm toilet and rehab  LOS: 9 days    GOLD,WAYNE E 09/02/2016  Milrinone stopped this pm- observe tomorrow and home fri DC PICC before [place] nurse for INR draws-results to Coumadin clinic patient examined and medical record reviewed,agree with above note. Tharon Aquas Trigt III 09/02/2016

## 2016-09-02 NOTE — Progress Notes (Signed)
Patient sitting up in bed, pain meds given, no other needs at this time. Call light within reach.

## 2016-09-03 LAB — COMPREHENSIVE METABOLIC PANEL
ALT: 17 U/L (ref 17–63)
AST: 25 U/L (ref 15–41)
Albumin: 3.2 g/dL — ABNORMAL LOW (ref 3.5–5.0)
Alkaline Phosphatase: 81 U/L (ref 38–126)
Anion gap: 8 (ref 5–15)
BUN: 21 mg/dL — ABNORMAL HIGH (ref 6–20)
CO2: 29 mmol/L (ref 22–32)
Calcium: 8.8 mg/dL — ABNORMAL LOW (ref 8.9–10.3)
Chloride: 100 mmol/L — ABNORMAL LOW (ref 101–111)
Creatinine, Ser: 1.35 mg/dL — ABNORMAL HIGH (ref 0.61–1.24)
GFR calc Af Amer: >60 mL/min (ref >60)
GFR calc non Af Amer: 58 mL/min — ABNORMAL LOW (ref >60)
Glucose, Bld: 122 mg/dL — ABNORMAL HIGH (ref 65–99)
Potassium: 4.3 mmol/L (ref 3.5–5.1)
Sodium: 137 mmol/L (ref 135–145)
Total Bilirubin: 0.5 mg/dL (ref 0.3–1.2)
Total Protein: 6.6 g/dL (ref 6.5–8.1)

## 2016-09-03 LAB — PROTIME-INR
INR: 1.73
Prothrombin Time: 20.4 seconds — ABNORMAL HIGH (ref 11.4–15.2)

## 2016-09-03 LAB — CBC
HCT: 28.5 % — ABNORMAL LOW (ref 39.0–52.0)
Hemoglobin: 9 g/dL — ABNORMAL LOW (ref 13.0–17.0)
MCH: 27.6 pg (ref 26.0–34.0)
MCHC: 31.6 g/dL (ref 30.0–36.0)
MCV: 87.4 fL (ref 78.0–100.0)
Platelets: 304 10*3/uL (ref 150–400)
RBC: 3.26 MIL/uL — ABNORMAL LOW (ref 4.22–5.81)
RDW: 15.3 % (ref 11.5–15.5)
WBC: 10.5 10*3/uL (ref 4.0–10.5)

## 2016-09-03 LAB — GLUCOSE, CAPILLARY
Glucose-Capillary: 129 mg/dL — ABNORMAL HIGH (ref 65–99)
Glucose-Capillary: 78 mg/dL (ref 65–99)
Glucose-Capillary: 169 mg/dL — ABNORMAL HIGH (ref 65–99)
Glucose-Capillary: 88 mg/dL (ref 65–99)

## 2016-09-03 MED ORDER — AMIODARONE HCL 200 MG PO TABS
200.0000 mg | ORAL_TABLET | Freq: Every day | ORAL | Status: DC
  Administered 2016-09-03 – 2016-09-04 (×2): 200 mg via ORAL
  Filled 2016-09-03 (×2): qty 1

## 2016-09-03 MED ORDER — GLIPIZIDE 10 MG PO TABS
10.0000 mg | ORAL_TABLET | Freq: Every day | ORAL | Status: DC
  Administered 2016-09-04: 10 mg via ORAL
  Filled 2016-09-03: qty 1

## 2016-09-03 NOTE — Progress Notes (Addendum)
[address]       [place] [address]             [phone]      10 Days Post-Op Procedure(s) (LRB): CORONARY ARTERY BYPASS GRAFTING (CABG) x three,  using left internal mammary artery and right leg greater saphenous vein harvested endoscopically (N/A) TRANSESOPHAGEAL ECHOCARDIOGRAM (TEE) (N/A) CLIPPING OF ATRIAL APPENDAGE (N/A) Subjective: Feels pretty well , no new c/o   Objective: Vital signs in last 24 hours: Temp:  [97.9 F (36.6 C)-98 F (36.7 C)] 98 F (36.7 C) (11/16 0614) Pulse Rate:  [74-83] 74 (11/16 0614) Cardiac Rhythm: Normal sinus rhythm;Bundle branch block (11/15 1900) Resp:  [18] 18 (11/16 0614) BP: (93-111)/(58-68) 93/60 (11/16 0614) SpO2:  [99 %-100 %] 99 % (11/16 0614) Weight:  [159 lb 11.2 oz (72.4 kg)] 159 lb 11.2 oz (72.4 kg) (11/16 [id number])  Hemodynamic parameters for last 24 hours:    Intake/Output from previous day: 11/15 0701 - 11/16 0700 In: 240 [P.O.:240] Out: -  Intake/Output this shift: No intake/output data recorded.  General appearance: alert, cooperative and no distress Heart: regular rate and rhythm Lungs: clear to auscultation bilaterally Abdomen: benign Extremities: minor edema Wound: incis healing well  Lab Results:  Recent Labs  09/01/16 0500 09/03/16 0539  WBC 12.7* 10.5  HGB 9.5* 9.0*  HCT 29.0* 28.5*  PLT 288 304   BMET:  Recent Labs  09/01/16 0500 09/03/16 0539  NA 137 137  K 4.4 4.3  CL 97* 100*  CO2 28 29  GLUCOSE 92 122*  BUN 27* 21*  CREATININE 1.31* 1.35*  CALCIUM 8.9 8.8*    PT/INR:  Recent Labs  09/03/16 0539  LABPROT 20.4*  INR 1.73   ABG    Component Value Date/Time   PHART 7.311 (L) 08/26/2016 1950   HCO3 24.9 08/26/2016 1950   TCO2 30 08/28/2016 1731   ACIDBASEDEF 2.0 08/26/2016 1950   O2SAT 50.9 08/31/2016 0454   CBG (last 3)   Recent Labs  09/02/16 1634 09/02/16 2121 09/03/16 0614  GLUCAP 170* 100* 129*    Meds Scheduled Meds: . amiodarone   200 mg Oral BID  . aspirin  81 mg Oral Daily  . atorvastatin  80 mg Oral QHS  . bisacodyl  10 mg Oral Daily   Or  . bisacodyl  10 mg Rectal Daily  . buPROPion  150 mg Oral Daily  . digoxin  0.125 mg Oral Daily  . docusate sodium  200 mg Oral Daily  . ferrous Q000111Q C-folic acid  1 capsule Oral BID PC  . furosemide  40 mg Oral Daily  . glipiZIDE  10 mg Oral BID AC  . guaiFENesin  600 mg Oral BID  . insulin aspart  0-15 Units Subcutaneous TID WC  . insulin aspart  0-5 Units Subcutaneous QHS  . mouth rinse  15 mL Mouth Rinse BID  . metFORMIN  1,000 mg Oral BID WC  . metoprolol tartrate  12.5 mg Oral BID  . mupirocin cream   Topical BID  . pantoprazole  40 mg Oral Daily  . patient's guide to using coumadin book   Does not apply Once  . potassium chloride  40 mEq Oral BID  . simethicone  80 mg Oral QID  . sodium chloride flush  10-40 mL Intracatheter Q12H  . sodium chloride flush  3 mL Intravenous Q12H  . warfarin  5 mg Oral q1800  . warfarin   Does  not apply Once  . Warfarin - Physician Dosing Inpatient   Does not apply q1800   Continuous Infusions: PRN Meds:.sodium chloride, levalbuterol, magnesium hydroxide, metoprolol, ondansetron (ZOFRAN) IV, oxyCODONE, sodium chloride flush, sodium chloride flush, traMADol  Xrays No results found.  Assessment/Plan: S/P Procedure(s) (LRB): CORONARY ARTERY BYPASS GRAFTING (CABG) x three,  using left internal mammary artery and right leg greater saphenous vein harvested endoscopically (N/A) TRANSESOPHAGEAL ECHOCARDIOGRAM (TEE) (N/A) CLIPPING OF ATRIAL APPENDAGE (N/A)   1 doing well 2 hemodyn stable- BP too low for ACE-I/ARB 3 conts coumadin 4 sugars reasonably well controlled- motivated to manage closely with diet 5 QTc is > 500, decrease amio to 200 daily     LOS: 10 days    [name],[name] 11/16/2017home in am if he continues to do well of milrinone Cont daily lasix patient examined and medical record reviewed,agree  with above note. Tharon Aquas Trigt III 09/03/2016

## 2016-09-03 NOTE — Discharge Summary (Signed)
Physician Discharge Summary  Patient ID: [name] MRN: [id number] DOB/AGE: July 23, 1961 55 y.o.  Admit date: 08/24/2016 Discharge date: 09/04/2016  Admission Diagnoses:Coronary artery disease  Discharge Diagnoses:  Active Problems:   Coronary artery disease  Patient Active Problem List   Diagnosis Date Noted  . Coronary artery disease 08/24/2016  . Acute on chronic systolic CHF (congestive heart failure) ([facility]) 06/17/2016  . Smoking   . Non-ST elevation myocardial infarction (NSTEMI), subsequent episode of care ([facility]) 06/11/2016  . Acute respiratory failure with hypoxia ([place])   . Hypoxia   . Abscess of axilla, left 06/10/2016  . Type 2 diabetes mellitus, uncontrolled ([place]) 05/23/2013  . Essential hypertension 05/23/2013  . Pure hypercholesterolemia 05/23/2013    HPI:at time of evaluation  The patient returns for further discussion of his documented severe multivessel coronary disease with significant LV dysfunction EF 25-30 percent. The patient was an uncontrolled diabetic and presented in August with a left axillary abscess with MRSA and underwent drainage by general surgery under anesthesia. The patient had a non-ST elevation MI after surgery. He underwent cardiac catheterization. He had occlusion of the circumflex, small nondominant RCA and high-grade stenosis of the LAD. LVEDP 16. Echo shows EF 20% without significant MR. The patient had a badly infected left axilla and was discharged. He is now completed extended antibiotics with doxycycline and wound care and the axilla is now healed. The patient denies symptoms of angina or shortness of breath. The patient's diabetes is now under much better control and he is compliant with his medications. His hemoglobin A1c has decreased dramatically. The patient has stopped smoking.  The patient was diagnosed with atrial fibrillation during his admission and was placed on Eliquis. No bleeding complications noted.  He was admitted for  surgical coronary artery revascularization.  Discharged Condition: good   Hospital Course:  The patient was admitted and taken to the OR on 08/24/2016 at which time he underwent the below described procedure. He tolerated the procedure well was taken to the surgical intensive care unit in stable condition.  Postoperative hospital course:  Overall the patient has progressed nicely. He was extubated without difficulty using standard protocols. He did initially require inotropic support due to ischemic cardiomyopathy and low ejection fraction. He had postoperative volume overload but is responding well to diuretics. The milrinone was kept on several days and gradually weaned. With his history of previous atrial fibrillation he has been started on oral Coumadin. He has had some atrial fibrillation postoperatively and is also on amiodarone and beta blocker. Diabetes has been under adequate control using usual measures he has been instructed on further management from a dietary perspective. He is quite motivated to control this issue as much as is feasible. He has an expected acute blood loss anemia and values have stabilized. His renal function is slightly elevated with most recent creatinine 1.35. Incisions are noted to be healing well without evidence of infection. He is tolerating diet. He is tolerating gradually increasing activities using standard cardiac rehabilitation protocols. At time of discharge she is felt to be quite stable.  Consults: None  Significant Diagnostic Studies: Routine postoperative serial laboratory and chest x-rays.  Treatments: surgery:  DATE OF PROCEDURE:  08/24/2016 DATE OF DISCHARGE:                              OPERATIVE REPORT   OPERATION: 1. Coronary artery bypass grafting x3 (left internal mammary artery to  LAD, saphenous vein graft to ramus intermediate, saphenous vein     graft to diagonal). 2. Endoscopic harvest of right leg greater saphenous  vein.  SURGEON:  Ivin Poot, M.D.  ASSISTANT:  Lars Pinks, PA-C  ANESTHESIA:  General by Dr. Roberts Gaudy. Discharge Exam: Blood pressure 109/66, pulse 87, temperature 97.9 F (36.6 C), temperature source Oral, resp. rate 18, height _0  (1.676 m), weight 158 lb 3.2 oz (71.8 kg), SpO2 98 %.   General appearance: alert, cooperative and no distress Heart: regular rate and rhythm Lungs: clear to auscultation bilaterally Abdomen: BENIGN Extremities: TRACE EDEMA Wound: incis healing well  Disposition: 01-Home or Self Care  Discharge Instructions    Amb Referral to Cardiac Rehabilitation    Complete by:  As directed    Diagnosis:  CABG   CABG X ___:  3       Medication List    STOP taking these medications   acetaminophen 500 MG tablet Commonly known as:  TYLENOL   apixaban 5 MG Tabs tablet Commonly known as:  ELIQUIS   doxycycline 100 MG capsule Commonly known as:  VIBRAMYCIN   guaiFENesin-dextromethorphan 100-10 MG/5ML syrup Commonly known as:  ROBITUSSIN DM   nicotine 7 mg/24hr patch Commonly known as:  NICODERM CQ - dosed in mg/24 hr   nitroGLYCERIN 0.4 MG SL tablet Commonly known as:  NITROSTAT   triamcinolone ointment 0.5 % Commonly known as:  KENALOG     TAKE these medications   amiodarone 200 MG tablet Commonly known as:  PACERONE Take 1 tablet (200 mg total) by mouth daily.   aspirin 81 MG chewable tablet Chew 1 tablet (81 mg total) by mouth daily.   atorvastatin 80 MG tablet Commonly known as:  LIPITOR Take 1 tablet (80 mg total) by mouth at bedtime.   blood glucose meter kit and supplies Kit Dispense based on patient and insurance preference. Use up to four times daily as directed. (FOR ICD-9 250.00, 250.01).   buPROPion 150 MG 24 hr tablet Commonly known as:  WELLBUTRIN XL Take 1 tablet (150 mg total) by mouth daily.   digoxin 0.125 MG tablet Commonly known as:  LANOXIN Take 1 tablet (0.125 mg total) by mouth daily.    ferrous [id number] C-folic acid capsule Commonly known as:  TRINSICON / FOLTRIN Take 1 capsule by mouth daily.   furosemide 40 MG tablet Commonly known as:  LASIX Take 1 tablet (40 mg total) by mouth daily.   glipiZIDE 10 MG tablet Commonly known as:  GLUCOTROL Take 1 tablet (10 mg total) by mouth 2 (two) times daily before a meal.   KLOR-CON M20 20 MEQ tablet Generic drug:  potassium chloride SA Take 1 tablet (20 mEq total) by mouth daily. What changed:  how much to take  when to take this   metFORMIN 1000 MG tablet Commonly known as:  GLUCOPHAGE Take 1 tablet (1,000 mg total) by mouth 2 (two) times daily with a meal.   metoprolol tartrate 25 MG tablet Commonly known as:  LOPRESSOR Take 0.5 tablets (12.5 mg total) by mouth 2 (two) times daily. What changed:  medication strength  how much to take   ONE TOUCH ULTRA TEST test strip Generic drug:  glucose blood USE UP TO 4 TIMES DAILY AS DIRECTED   ONETOUCH DELICA LANCETS 01U Misc USE UP TO 4 TIMES DAILY AS DIRECTED   oxyCODONE 5 MG immediate release tablet Commonly known as:  Oxy IR/ROXICODONE Take 1-2 tablets (5-10 mg total) by  mouth every 6 (six) hours as needed for severe pain.   warfarin 5 MG tablet Commonly known as:  COUMADIN Take 1 tablet (5 mg total) by mouth daily at 6 PM. As prescribed by coumadin clinic      Follow-up Information    Tharon Aquas Trigt III, MD Follow up.   Specialty:  Cardiothoracic Surgery Why:  Appointment see the surgeon on 09/30/2016 at 3:30 PM. Please obtain a chest x-ray [facility] at 3 PM. [facility] is located in the same office complex. Contact information: [address] [place] [address] [phone]          Bernerd Pho PA-C 09/21/16 at 8 am Home health has been ordered to do PT/INR draws  The patient has been discharged on:   1.Beta Blocker:  Yes [ y  ]                              No   [   ]                               If No, reason:  2.Ace Inhibitor/ARB: Yes [   ]                                     No  [ n   ]                                     If No, reason:low bp , renal insuff  3.Statin:   Yes [ y  ]                  No  [   ]                  If No, reason:  4.Ecasa:  Yes  [ y  ]                  No   [   ]                  If No, reason:  Signed: GOLD,WAYNE E 09/04/2016, 8:04 AM

## 2016-09-04 ENCOUNTER — Other Ambulatory Visit: Payer: Self-pay | Admitting: Adult Health

## 2016-09-04 LAB — PROTIME-INR
INR: 2.17
Prothrombin Time: 24.6 seconds — ABNORMAL HIGH (ref 11.4–15.2)

## 2016-09-04 LAB — GLUCOSE, CAPILLARY: Glucose-Capillary: 121 mg/dL — ABNORMAL HIGH (ref 65–99)

## 2016-09-04 MED ORDER — DIGOXIN 125 MCG PO TABS: 0.1250 mg | ORAL_TABLET | Freq: Every day | ORAL | 1 refills | Status: DC

## 2016-09-04 MED ORDER — AMIODARONE HCL 200 MG PO TABS: 200.0000 mg | ORAL_TABLET | Freq: Every day | ORAL | 1 refills | Status: DC

## 2016-09-04 MED ORDER — KLOR-CON M20 20 MEQ PO TBCR: 20.0000 meq | EXTENDED_RELEASE_TABLET | Freq: Every day | ORAL | 1 refills | Status: DC

## 2016-09-04 MED ORDER — GLIPIZIDE 10 MG PO TABS: 10.0000 mg | ORAL_TABLET | Freq: Two times a day (BID) | ORAL | 1 refills | Status: DC

## 2016-09-04 MED ORDER — METOPROLOL TARTRATE 25 MG PO TABS: 12.5000 mg | ORAL_TABLET | Freq: Two times a day (BID) | ORAL | 1 refills | Status: DC

## 2016-09-04 MED ORDER — METFORMIN HCL 1000 MG PO TABS: 1000.0000 mg | ORAL_TABLET | Freq: Two times a day (BID) | ORAL | 3 refills | Status: DC

## 2016-09-04 MED ORDER — BUPROPION HCL ER (XL) 150 MG PO TB24: 150.0000 mg | ORAL_TABLET | Freq: Every day | ORAL | 1 refills | Status: DC

## 2016-09-04 MED ORDER — OXYCODONE HCL 5 MG PO TABS: 5.0000 mg | ORAL_TABLET | Freq: Four times a day (QID) | ORAL | 0 refills | Status: DC | PRN

## 2016-09-04 MED ORDER — ATORVASTATIN CALCIUM 80 MG PO TABS: 80.0000 mg | ORAL_TABLET | Freq: Every day | ORAL | 3 refills | Status: DC

## 2016-09-04 MED ORDER — FUROSEMIDE 40 MG PO TABS: 40.0000 mg | ORAL_TABLET | Freq: Every day | ORAL | 0 refills | Status: DC

## 2016-09-04 MED ORDER — FE FUMARATE-B12-VIT C-FA-IFC PO CAPS: 1.0000 | ORAL_CAPSULE | Freq: Every day | ORAL | 1 refills | Status: DC

## 2016-09-04 MED ORDER — WARFARIN SODIUM 5 MG PO TABS: 5.0000 mg | ORAL_TABLET | Freq: Every day | ORAL | 1 refills | Status: DC

## 2016-09-04 NOTE — Progress Notes (Signed)
[address]       [place] [address]             [phone]      11 Days Post-Op Procedure(s) (LRB): CORONARY ARTERY BYPASS GRAFTING (CABG) x three,  using left internal mammary artery and right leg greater saphenous vein harvested endoscopically (N/A) TRANSESOPHAGEAL ECHOCARDIOGRAM (TEE) (N/A) CLIPPING OF ATRIAL APPENDAGE (N/A) Subjective: FEELS WELL  Objective: Vital signs in last 24 hours: Temp:  [97.9 F (36.6 C)-98 F (36.7 C)] 97.9 F (36.6 C) (11/17 0430) Pulse Rate:  [72-87] 87 (11/17 0430) Cardiac Rhythm: Normal sinus rhythm (11/16 1942) Resp:  [18] 18 (11/17 0430) BP: (97-116)/(66-69) 109/66 (11/17 0430) SpO2:  [98 %-100 %] 98 % (11/17 0430) Weight:  [158 lb 3.2 oz (71.8 kg)] 158 lb 3.2 oz (71.8 kg) (11/17 0430)  Hemodynamic parameters for last 24 hours:    Intake/Output from previous day: 11/16 0701 - 11/17 0700 In: 1547.8 [P.O.:840; I.V.:707.8] Out: -  Intake/Output this shift: No intake/output data recorded.  General appearance: alert, cooperative and no distress Heart: regular rate and rhythm Lungs: clear to auscultation bilaterally Abdomen: BENIGN Extremities: TRACE EDEMA Wound: incis healing well  Lab Results:  Recent Labs  09/03/16 0539  WBC 10.5  HGB 9.0*  HCT 28.5*  PLT 304   BMET:  Recent Labs  09/03/16 0539  NA 137  K 4.3  CL 100*  CO2 29  GLUCOSE 122*  BUN 21*  CREATININE 1.35*  CALCIUM 8.8*    PT/INR:  Recent Labs  09/04/16 0441  LABPROT 24.6*  INR 2.17   ABG    Component Value Date/Time   PHART 7.311 (L) 08/26/2016 1950   HCO3 24.9 08/26/2016 1950   TCO2 30 08/28/2016 1731   ACIDBASEDEF 2.0 08/26/2016 1950   O2SAT 50.9 08/31/2016 0454   CBG (last 3)   Recent Labs  09/03/16 1626 09/03/16 2134 09/04/16 0627  GLUCAP 169* 88 121*    Meds Scheduled Meds: . amiodarone  200 mg Oral Daily  . aspirin  81 mg Oral Daily  . atorvastatin  80 mg Oral QHS  . bisacodyl  10 mg Oral Daily    Or  . bisacodyl  10 mg Rectal Daily  . buPROPion  150 mg Oral Daily  . digoxin  0.125 mg Oral Daily  . docusate sodium  200 mg Oral Daily  . ferrous Q000111Q C-folic acid  1 capsule Oral BID PC  . furosemide  40 mg Oral Daily  . glipiZIDE  10 mg Oral QAC breakfast  . guaiFENesin  600 mg Oral BID  . insulin aspart  0-15 Units Subcutaneous TID WC  . insulin aspart  0-5 Units Subcutaneous QHS  . mouth rinse  15 mL Mouth Rinse BID  . metFORMIN  1,000 mg Oral BID WC  . metoprolol tartrate  12.5 mg Oral BID  . pantoprazole  40 mg Oral Daily  . patient's guide to using coumadin book   Does not apply Once  . potassium chloride  40 mEq Oral BID  . simethicone  80 mg Oral QID  . sodium chloride flush  10-40 mL Intracatheter Q12H  . sodium chloride flush  3 mL Intravenous Q12H  . warfarin  5 mg Oral q1800  . warfarin   Does not apply Once  . Warfarin - Physician Dosing Inpatient   Does not apply q1800   Continuous Infusions: PRN Meds:.sodium chloride, levalbuterol, magnesium hydroxide, metoprolol, ondansetron (ZOFRAN) IV, oxyCODONE,  sodium chloride flush, sodium chloride flush, traMADol  Xrays No results found.  Assessment/Plan: S/P Procedure(s) (LRB): CORONARY ARTERY BYPASS GRAFTING (CABG) x three,  using left internal mammary artery and right leg greater saphenous vein harvested endoscopically (N/A) TRANSESOPHAGEAL ECHOCARDIOGRAM (TEE) (N/A) CLIPPING OF ATRIAL APPENDAGE (N/A)  1 doing well, stable for d/c    LOS: 11 days    GOLD,WAYNE E 09/04/2016

## 2016-09-04 NOTE — Telephone Encounter (Signed)
° ° °  [facility] is telling pt that they do not have the following RX  Some of these were called in earlier Nov   Pt insurance requies 90 day supply on all the below meds    buPROPion (WELLBUTRIN XL) 150 MG 24 hr tablet  atorvastatin (LIPITOR) 80 MG tablet      metFORMIN (GLUCOPHAGE) 1000 MG tablet   glipiZIDE (GLUCOTROL) 10 MG tabl    [facility]

## 2016-09-04 NOTE — Care Management Note (Signed)
Case Management Note Previous CM note initiated by Girard Cooter, RN 08/25/2016, 11:21 AM   Patient Details  Name: [name] MRN: [id number] Date of Birth: [birth year]  Subjective/Objective:   Pt lives with his mother and his daughter, states one of them will be available for assistance if needed 24/7.  Was independent PTA and has needed no adaptive equipment.                            Action/Plan: S/p CABG tx from ICU to 2W on 08/31/16  Expected Discharge Date:  09/04/16               Expected Discharge Plan:  Home/Self Care  In-House Referral:     Discharge planning Services  CM Consult  Post Acute Care Choice:    Choice offered to:     DME Arranged:    DME Agency:     HH Arranged:    [place] Agency:     Status of Service:  Completed, signed off  If discussed at [employer] of Stay Meetings, dates discussed:    Additional Comments:  09/04/16- 1030- Elleanor Guyett RN, CM- referral for INR check- spoke with bedside RN- pt to scheduled with outpt [facility] for coumadin clinic check on 11/20.- no further CM needs.    Dawayne Patricia, RN 09/04/2016, 10:30 AM [phone]

## 2016-09-04 NOTE — Telephone Encounter (Signed)
Rx's sent to pharmacy as requested all 90 day supply with refills.

## 2016-09-15 ENCOUNTER — Ambulatory Visit (INDEPENDENT_AMBULATORY_CARE_PROVIDER_SITE_OTHER): Payer: BLUE CROSS/BLUE SHIELD | Admitting: Pharmacist

## 2016-09-15 DIAGNOSIS — Z5181 Encounter for therapeutic drug level monitoring: Secondary | ICD-10-CM | POA: Diagnosis not present

## 2016-09-15 DIAGNOSIS — I4891 Unspecified atrial fibrillation: Secondary | ICD-10-CM

## 2016-09-15 LAB — POCT INR: INR: 2.9

## 2016-09-17 DIAGNOSIS — Z736 Limitation of activities due to disability: Secondary | ICD-10-CM

## 2016-09-18 DIAGNOSIS — Z951 Presence of aortocoronary bypass graft: Secondary | ICD-10-CM | POA: Insufficient documentation

## 2016-09-21 ENCOUNTER — Encounter: Payer: Self-pay | Admitting: Student

## 2016-09-21 ENCOUNTER — Ambulatory Visit (INDEPENDENT_AMBULATORY_CARE_PROVIDER_SITE_OTHER): Payer: BLUE CROSS/BLUE SHIELD | Admitting: Pharmacist Clinician (PhC)/ Clinical Pharmacy Specialist

## 2016-09-21 ENCOUNTER — Ambulatory Visit (INDEPENDENT_AMBULATORY_CARE_PROVIDER_SITE_OTHER): Payer: BLUE CROSS/BLUE SHIELD | Admitting: Student

## 2016-09-21 ENCOUNTER — Ambulatory Visit: Payer: BLUE CROSS/BLUE SHIELD | Admitting: Student

## 2016-09-21 ENCOUNTER — Telehealth: Payer: Self-pay

## 2016-09-21 VITALS — BP 139/81 | HR 86 | Ht 66.0 in | Wt 163.0 lb

## 2016-09-21 DIAGNOSIS — I4891 Unspecified atrial fibrillation: Secondary | ICD-10-CM

## 2016-09-21 DIAGNOSIS — Z5181 Encounter for therapeutic drug level monitoring: Secondary | ICD-10-CM

## 2016-09-21 DIAGNOSIS — Z951 Presence of aortocoronary bypass graft: Secondary | ICD-10-CM | POA: Diagnosis not present

## 2016-09-21 DIAGNOSIS — I10 Essential (primary) hypertension: Secondary | ICD-10-CM

## 2016-09-21 DIAGNOSIS — I2510 Atherosclerotic heart disease of native coronary artery without angina pectoris: Secondary | ICD-10-CM

## 2016-09-21 DIAGNOSIS — I480 Paroxysmal atrial fibrillation: Secondary | ICD-10-CM | POA: Diagnosis not present

## 2016-09-21 DIAGNOSIS — I255 Ischemic cardiomyopathy: Secondary | ICD-10-CM

## 2016-09-21 LAB — CBC
HCT: 38.3 % — ABNORMAL LOW (ref 38.5–50.0)
Hemoglobin: 12.1 g/dL — ABNORMAL LOW (ref 13.2–17.1)
MCH: 26.9 pg — ABNORMAL LOW (ref 27.0–33.0)
MCHC: 31.6 g/dL — ABNORMAL LOW (ref 32.0–36.0)
MCV: 85.3 fL (ref 80.0–100.0)
MPV: 11.2 fL (ref 7.5–12.5)
PLATELETS: 170 10*3/uL (ref 140–400)
RBC: 4.49 MIL/uL (ref 4.20–5.80)
RDW: 15 % (ref 11.0–15.0)
WBC: 7.4 10*3/uL (ref 3.8–10.8)

## 2016-09-21 LAB — POCT INR: INR: 1.6

## 2016-09-21 MED ORDER — METOPROLOL TARTRATE 25 MG PO TABS: 25.0000 mg | ORAL_TABLET | Freq: Two times a day (BID) | ORAL | 11 refills | Status: DC

## 2016-09-21 NOTE — Telephone Encounter (Signed)
Pt called [facility] with c/o red splotches on temples, face and neck. Was advised with HomeCare rash protocol.  Dent   Patient Name: [name] Gender: Male DOB: 02-10-1961 Age: 55 Y 3 M 13 D Return Phone Number: [phone] (Primary), [phone] (Secondary) Address: City/State/Zip: [place] Client [facility] Night - Client Client Site [facility] - Night Physician [name] - NP Contact Type Call Who Is Calling Patient / Member / Family / Caregiver Call Type Triage / Clinical Caller Name Elmon Else Relationship To Patient Daughter Return Phone Number [phone] (Primary) Chief Complaint Medication reaction Reason for Call Symptomatic / Request for [name] states he had triple by pass the 6th. He has a red splotches on his forehead and ging down his face.Wondering if this is could be a reaction to his medication. She states that the medicaiton has not Changed.  Disp. Time Eilene Ghazi Time) Disposition Final User 09/19/2016 2:10:29 PM  Home Care Yes Adrian Blackwater, RN, [facility]

## 2016-09-21 NOTE — Telephone Encounter (Signed)
Spoke with pt and he states that he was vomiting a lot Friday and then noticed the red splotches the next day. He denies any itching or swelling. Advised pt that since he is on anticoagulant medication they are likely burst blood vessels from the force of vomiting. Pt states that he would agree based on the appearance of the areas. Pt has been having issues with diarrhea and now vomiting. Pt was advised to have CDiff labs done. Pt has appt with [facility] tomorrow and was advised to keep it. Pt voiced understanding. Nothing further needed.

## 2016-09-21 NOTE — Progress Notes (Signed)
Cardiology Office Note    Date:  09/21/2016   ID:  [name], DOB 11/21/1960, MRN [id number]  PCP:  [name], NP  Cardiologist: Dr. Debara Pickett   Chief Complaint  Patient presents with  . Hospitalization Follow-up    History of Present Illness:    [name] is a 55 y.o. male with past medical history of CAD (s/p NSTEMI in 05/2016 with cath showing severe 3 vessel CAD), ischemic cardiomyopathy (EF 25-35% by echo in 05/2014), PAF (previously on Eliquis), Type 2 DM, HTN, HLD, COPD and prior tobacco use who presents today for hospital follow-up following recent CABG.  Had previously been admitted from 8/23 - 06/19/2016 for I&D of a left axillary abscess. The day following the procedure, he developed an NSTEMI with his EKG showing new inferior Q-waves and lateral ST depression with a peak troponin of 16.06. Echo with reduced EF of 25-35% and akinesis of the basal-midinferolateral and inferior myocardium. Cardiac cath on 8/29 showed severe multivessel CAD with diffuse narrowing of the LAD with 60-70% proximal stenoses, total occlusion of a high marginal/ramus intermediate vessel with distal retrograde filling, 85% LCx stenosis, and 50% stenoses in a small nondominant RCA. CABG was recommended. He was not thought to be a current CABG candidate with his active MRSA wound along his axilla.   Was seen in the office by Dr. Prescott Gum on 10/25 and CABG was scheduled for 08/24/2016. This was performed with LIMA-LAD, SVG-D1, and SVG-RI with clipping of the atrial appendage. He required pressor support following surgery with Dopamine, Milrinone, and Norepinephrine. Developed transient episodes of PAF and was started on IV Amiodarone. Pressor support was weaned throughout the hospitalization and IV Amiodarone was switched to PO Amiodarone 457m daily with this being reduced to 2048mdaily prior to discharge due to his QTc being > 500 ms. Was started on Coumadin for his atrial fibrillation. Was also  continued on ASA 8183maily, Atorvastatin 68m65mily, Digoxin 0.125mg62mly, Lasix 40mg 63my, and Lopressor 12.5mg BI12mWas not started on an ACE-I/ARB secondary to hypotension and renal insufficiency. Weight at time of discharge was 158 lbs with creatinine of 1.35. Hgb stable at 9.0.  He reports doing well since surgery. Has experienced some sternal incisional pain which is worse with coughing but has only had to take 5 Oxycodone tablets since hospital discharge. Denies any chest discomfort or dyspnea with exertion. Is up to walking 1 mile per day but says he is "not back to baseline yet". Multiple members of his family have been sick with a viral illness and he is starting to experience a mild cough. Denies any fevers, chills, or phlegm production.   Sternal suture line is healing well along with vein harvest sites. Does have mild swelling along his right harvest site. This has been present since surgery and has not progressed in size or severity. Does note mild erythema along his forehead and temples which has been present for the past 2 days following a vomiting episode.    Past Medical History:  Diagnosis Date  . Abrasion L FOREARM  . Atrial fibrillation (HCC)   [place] initially occurring in post-op setting in 05/2016, started on Eliquis b. 08/2016: s/p clipping of atrial appendage at time of CABG --> Eliquis switched to Coumadin  . CAD (coronary artery disease)    a. 05/2016: NSTEMI, cath showing 3v disease with CABG recommended once MRSA infection resolved. b. 08/2016: CABG with LIMA-LAD, SVG-D1, and SVG-RI.  . [place] (HCC)Whitney  basal cell carcinoma of the skin  . Diabetes mellitus without complication ([place])   . Headache(784.0)   . Hematuria, microscopic "SINCE I WAS A KID"  . Hyperlipidemia   . Hypertension   . Ischemic cardiomyopathy    a. 05/2016: echo w/ EF of 25-35% and akinesis of the basal-midinferolateral and inferior myocardium.  . Lactose intolerance   . Myocardial infarction     . Respiratory failure ([place])   . Rotator cuff tear RIGHT  . Tobacco use    a. quit in 05/2016 following MI    Past Surgical History:  Procedure Laterality Date  . CARDIAC CATHETERIZATION N/A 06/16/2016   Procedure: Left Heart Cath and Coronary Angiography;  Surgeon: Troy Sine, MD;  Location: [facility];  Service: Cardiovascular;  Laterality: N/A;  . CLIPPING OF ATRIAL APPENDAGE N/A 08/24/2016   Procedure: CLIPPING OF ATRIAL APPENDAGE;  Surgeon: Ivin Poot, MD;  Location: [facility];  Service: Open Heart Surgery;  Laterality: N/A;  . CORONARY ARTERY BYPASS GRAFT N/A 08/24/2016   Procedure: CORONARY ARTERY BYPASS GRAFTING (CABG) x three,  using left internal mammary artery and right leg greater saphenous vein harvested endoscopically;  Surgeon: Ivin Poot, MD;  Location: [facility];  Service: Open Heart Surgery;  Laterality: N/A;  . HERNIA REPAIR  X2  . INCISION AND DRAINAGE ABSCESS Left 06/10/2016   Procedure: INCISION AND DRAINAGE LEFT AXILLARY ABSCESS;  Surgeon: Autumn Messing III, MD;  Location: [facility];  Service: General;  Laterality: Left;  . ROTATOR CUFF REPAIR Bilateral   . TEE WITHOUT CARDIOVERSION N/A 08/24/2016   Procedure: TRANSESOPHAGEAL ECHOCARDIOGRAM (TEE);  Surgeon: Ivin Poot, MD;  Location: [facility];  Service: Open Heart Surgery;  Laterality: N/A;    Current Medications: Outpatient Medications Prior to Visit  Medication Sig Dispense Refill  . amiodarone (PACERONE) 200 MG tablet Take 1 tablet (200 mg total) by mouth daily. 30 tablet 1  . aspirin 81 MG chewable tablet Chew 1 tablet (81 mg total) by mouth daily. 30 tablet 3  . atorvastatin (LIPITOR) 80 MG tablet Take 1 tablet (80 mg total) by mouth at bedtime. 90 tablet 3  . blood glucose meter kit and supplies KIT Dispense based on patient and insurance preference. Use up to four times daily as directed. (FOR ICD-9 250.00, 250.01). 1 each 0  . buPROPion (WELLBUTRIN XL) 150 MG 24 hr tablet Take 1 tablet (150 mg total)  by mouth daily. 90 tablet 1  . ferrous [id number] C-folic acid (TRINSICON / FOLTRIN) capsule Take 1 capsule by mouth daily. 30 capsule 1  . furosemide (LASIX) 40 MG tablet Take 1 tablet (40 mg total) by mouth daily. 30 tablet 0  . glipiZIDE (GLUCOTROL) 10 MG tablet Take 1 tablet (10 mg total) by mouth 2 (two) times daily before a meal. 180 tablet 1  . KLOR-CON M20 20 MEQ tablet Take 1 tablet (20 mEq total) by mouth daily. 30 tablet 1  . metFORMIN (GLUCOPHAGE) 1000 MG tablet Take 1 tablet (1,000 mg total) by mouth 2 (two) times daily with a meal. 180 tablet 3  . ONE TOUCH ULTRA TEST test strip USE UP TO 4 TIMES DAILY AS DIRECTED  2  . ONETOUCH DELICA LANCETS 66A MISC USE UP TO 4 TIMES DAILY AS DIRECTED  2  . oxyCODONE (OXY IR/ROXICODONE) 5 MG immediate release tablet Take 1-2 tablets (5-10 mg total) by mouth every 6 (six) hours as needed for severe pain. 28 tablet 0  . warfarin (COUMADIN)  5 MG tablet Take 1 tablet (5 mg total) by mouth daily at 6 PM. As prescribed by coumadin clinic 100 tablet 1  . digoxin (LANOXIN) 0.125 MG tablet Take 1 tablet (0.125 mg total) by mouth daily. 30 tablet 1  . metoprolol tartrate (LOPRESSOR) 25 MG tablet Take 0.5 tablets (12.5 mg total) by mouth 2 (two) times daily. 30 tablet 1   No facility-administered medications prior to visit.      Allergies:   Latex   Social History   Social History  . Marital status: Married    Spouse name: N/A  . Number of children: N/A  . Years of education: N/A   Social History Main Topics  . Smoking status: Former Smoker    Packs/day: 1.50  . Smokeless tobacco: Never Used     Comment: last use 07/2016  . Alcohol use No  . Drug use: No  . Sexual activity: Yes   Other Topics Concern  . None   Social History Narrative  . None     Family History:  The patient's family history includes Diabetes in his father; Heart attack in his sister; Hypertension in his father and mother; Sudden death in his father.    Review of Systems:   Please see the history of present illness.     General:  No chills, fever, night sweats or weight changes. Positive for generalized fatigue. Cardiovascular:  No dyspnea on exertion, edema, orthopnea, palpitations, paroxysmal nocturnal dyspnea. Positive for sternal chest pain.  Dermatological: No rash, lesions/masses Respiratory: No cough, dyspnea Urologic: No hematuria, dysuria Abdominal:   No nausea, vomiting, diarrhea, bright red blood per rectum, melena, or hematemesis Neurologic:  No visual changes, wkns, changes in mental status. All other systems reviewed and are otherwise negative except as noted above.   Physical Exam:    VS:  BP 139/81   Pulse 86   Ht '5\' 6"'  (1.676 m)   Wt 163 lb (73.9 kg)   BMI 26.31 kg/m    General: Well developed, well nourished Caucasian male appearing in no acute distress. Head: Normocephalic, atraumatic, sclera non-icteric, no xanthomas, nares are without discharge.  Neck: No carotid bruits. JVD not elevated.  Lungs: Respirations regular and unlabored, without wheezes or rales.  Heart: Regular rate and rhythm. No S3 or S4.  No murmur, no rubs, or gallops appreciated. Sternal suture line well-healing with no evidence of drainage or significant erythema.  Abdomen: Soft, non-tender, non-distended with normoactive bowel sounds. No hepatomegaly. No rebound/guarding. No obvious abdominal masses. Msk:  Strength and tone appear normal for age. No joint deformities or effusions. Extremities: No clubbing or cyanosis. No edema.  Distal pedal pulses are 2+ bilaterally. Small area of swelling measuring approximately 3 cm in diameter along right popliteal site which is mobile and most consistent with a hematoma. No bruit appreciated. Neuro: Alert and oriented X 3. Moves all extremities spontaneously. No focal deficits noted. Psych:  Responds to questions appropriately with a normal affect. Skin: No rashes or lesions noted  Wt Readings from  Last 3 Encounters:  09/21/16 163 lb (73.9 kg)  09/04/16 158 lb 3.2 oz (71.8 kg)  08/12/16 156 lb (70.8 kg)      Studies/Labs Reviewed:   EKG:  EKG is ordered today.  The ekg ordered today demonstrates NSR, HR 86 with down-sloping ST segment in the lateral leads.   Recent Labs: 06/12/2016: B Natriuretic Peptide 559.6 08/25/2016: Magnesium 1.8 09/03/2016: ALT 17; BUN 21; Creatinine, Ser 1.35; Hemoglobin 9.0; Platelets 304;  Potassium 4.3; Sodium 137   Lipid Panel    Component Value Date/Time   CHOL 185 05/23/2013 1058   TRIG 146.0 05/23/2013 1058   HDL 32.80 (L) 05/23/2013 1058   CHOLHDL 6 05/23/2013 1058   VLDL 29.2 05/23/2013 1058   LDLCALC 123 (H) 05/23/2013 1058   LDLDIRECT 169.5 08/23/2012 1628    Additional studies/ records that were reviewed today include:   Echocardiogram: 09/01/2016 Study Conclusions  - Left ventricle: The cavity size was normal. Wall thickness was   increased in a pattern of mild LVH. Systolic function was   moderately to severely reduced. The estimated ejection fraction   was in the range of 30% to 35%. Diffuse hypokinesis. There is   severe hypokinesis of the inferolateral and inferior myocardium.   Doppler parameters are consistent with abnormal left ventricular   relaxation (grade 1 diastolic dysfunction). - Mitral valve: Calcified annulus. Mildly thickened leaflets .  Impressions:  - Global hypokinesis with severe hypokinesis of the inferior and   inferolateral walls; overall moderate to severe LV dysfunction;   grade 1 diastolic dysfunction.  Assessment:    1. S/P CABG x 3   2. CAD in native artery   3. Cardiomyopathy, ischemic   4. PAF (paroxysmal atrial fibrillation) ([place])   5. Essential hypertension      Plan:   In order of problems listed above:  1. CAD/ S/p CABG x 3 - cath in 05/2016 showed severe multivessel CAD with diffuse narrowing of the LAD with 60-70% proximal stenoses, total occlusion of a high marginal/ramus  intermediate vessel with distal retrograde filling, 85% LCx stenosis, and 50% stenoses in a small nondominant RCA. CABG postponed until he recovered from active MRSA infection. - underwent CABG on 11/6 with LIMA-LAD, SVG-D1, and SVG-RI with clipping of the atrial appendage at that time. Hospital course complicated by need for inotropic support following surgery, post-operative anemia, and post-op atrial fibrillation. - has progressed well since hospital discharge. Having mild incisional pain as to be expected but is walking up to 1 mile daily and denies any exertional symptoms. Does have a 3 cm area of swelling along right SVG harvest site concerning for a small hematoma. Has been stable in size according to the patient. No bruit appreciated. Will continue to monitor for now. Patient aware to contact us or Dr. Prescott Gum if this increases in size or becomes painful.  - continue ASA, BB, and statin therapy. On Coumadin for atrial fibrillation. To follow-up with CT Surgery next week. Will check BMET and CBC today.   2. Ischemic Cardiomyopathy - EF at 25-35% in 05/2016 at the time of his NSTEMI. Repeat echo on 09/01/2016 following CABG showed an EF of 30-35%. Will repeat echocardiogram in 3 months following revascularization to reassess EF. - Weight up from 158 lbs at time of hospital discharge to 163 lbs today. Likely secondary to increased appetite as he does not appear volume overloaded on physical exam. - continue Lasix 40m daily. Can likely switch to PRN in several weeks. Increase Lopressor from 12.548mBID to 2563mID. Unable to tolerate ACE-I/ARB in the past secondary to hypotension.   3. PAF - has experienced at least 2 episodes, both occurring in the post-operative setting. Underwent atrial appendage clipping at time of CABG.  - This patients CHA2DS2-VASc Score and unadjusted Ischemic Stroke Rate (% per year) is equal to 4.8 % stroke rate/year from a score of 4 (CHF, HTN, DM, Vascular). Eliquis  transitioned to Coumadin following atrial appendage  clipping. - continue Amiodarone 269m daily. Will discontinue Digoxin with BP being improved and with his recent AKI. Titrate Lopressor from 12.581mBID to 2568mID.   4. HTN - actually hypotensive during recent hospital admission. Reports SBP readings in the 110's - 120's at home. BP at 139/81 during today's visit.  - increase BB dosing as above.  Encouraged to continue checking BP at home.   5. HLD - continue Atorvastatin 21m26mily. LFT's within normal limits on 09/03/2016. Will need repeat FLP in the next 2-3 months.    Medication Adjustments/Labs and Tests Ordered: Current medicines are reviewed at length with the patient today.  Concerns regarding medicines are outlined above.  Medication changes, Labs and Tests ordered today are listed in the Patient Instructions below. Patient Instructions  Medication Instructions: BritBernerd Pho-C has recommended making the following medication changes: 1. STOP Digoxin 2. INCREASE Metoprolol to 25 mg twice daily  Labwork: Your physician recommends that you return for lab work TODAY.  Testing/Procedures: 1. Echocardiogram in February 2018 - Your physician has requested that you have an echocardiogram. Echocardiography is a painless test that uses sound waves to create images of your heart. It provides your doctor with information about the size and shape of your heart and how well your heart's chambers and valves are working. This procedure takes approximately one hour. There are no restrictions for this procedure. This will be performed at our [facility] - [id number].  Follow-up: [place]ends that you schedule a follow-up appointment in FEBRUARY 2018 with Dr HiltDebara PickettIf you need a refill on your cardiac medications before your next appointment, please call your pharmacy.    Signed, BritErma Heritage  09/21/2016 4:12[facility]  [facility] [address] [place]  [id number]: [phone]: [phone] [address] StreetitL[address] BridgeporteM[place] [phone]: [phone]

## 2016-09-21 NOTE — Patient Instructions (Addendum)
Medication Instructions: Bernerd Pho, PA-C has recommended making the following medication changes: 1. STOP Digoxin 2. INCREASE Metoprolol to 25 mg twice daily  Labwork: Your physician recommends that you return for lab work TODAY.  Testing/Procedures: 1. Echocardiogram in February 2018 - Your physician has requested that you have an echocardiogram. Echocardiography is a painless test that uses sound waves to create images of your heart. It provides your doctor with information about the size and shape of your heart and how well your heart's chambers and valves are working. This procedure takes approximately one hour. There are no restrictions for this procedure. This will be performed at our [facility] location - [address].  Follow-up: [place] recommends that you schedule a follow-up appointment in FEBRUARY 2018 with Dr Debara Pickett. Please cancel your follow-up appointment for Friday, 10/02/16 with Dr Debara Pickett.  If you need a refill on your cardiac medications before your next appointment, please call your pharmacy.

## 2016-09-22 ENCOUNTER — Encounter: Payer: Self-pay | Admitting: Adult Health

## 2016-09-22 ENCOUNTER — Ambulatory Visit (INDEPENDENT_AMBULATORY_CARE_PROVIDER_SITE_OTHER): Payer: BLUE CROSS/BLUE SHIELD | Admitting: Adult Health

## 2016-09-22 VITALS — BP 118/70 | HR 85 | Temp 98.2°F | Ht 66.0 in | Wt 162.5 lb

## 2016-09-22 DIAGNOSIS — IMO0001 Reserved for inherently not codable concepts without codable children: Secondary | ICD-10-CM

## 2016-09-22 DIAGNOSIS — E1165 Type 2 diabetes mellitus with hyperglycemia: Secondary | ICD-10-CM | POA: Diagnosis not present

## 2016-09-22 DIAGNOSIS — R059 Cough, unspecified: Secondary | ICD-10-CM

## 2016-09-22 DIAGNOSIS — Z951 Presence of aortocoronary bypass graft: Secondary | ICD-10-CM | POA: Diagnosis not present

## 2016-09-22 DIAGNOSIS — R05 Cough: Secondary | ICD-10-CM | POA: Diagnosis not present

## 2016-09-22 LAB — BASIC METABOLIC PANEL
BUN: 23 mg/dL (ref 7–25)
CALCIUM: 9.3 mg/dL (ref 8.6–10.3)
CO2: 29 mmol/L (ref 20–31)
CREATININE: 1.18 mg/dL (ref 0.70–1.33)
Chloride: 102 mmol/L (ref 98–110)
Glucose, Bld: 271 mg/dL — ABNORMAL HIGH (ref 65–99)
Potassium: 4.8 mmol/L (ref 3.5–5.3)
Sodium: 139 mmol/L (ref 135–146)

## 2016-09-22 LAB — POCT GLYCOSYLATED HEMOGLOBIN (HGB A1C): HEMOGLOBIN A1C: 7.2

## 2016-09-22 MED ORDER — BENZONATATE 200 MG PO CAPS: 200.0000 mg | ORAL_CAPSULE | Freq: Two times a day (BID) | ORAL | 0 refills | Status: DC | PRN

## 2016-09-22 NOTE — Progress Notes (Signed)
Subjective:    Patient ID: [name], male    DOB: 10-23-1960, 55 y.o.   MRN: [id number]  HPI  55 year old male who  has a past medical history of Abrasion (L FOREARM); Atrial fibrillation ([place]); CAD (coronary artery disease); Cancer ([place]); Diabetes mellitus without complication ([place]); Headache(784.0); Hematuria, microscopic ("SINCE I WAS A KID"); Hyperlipidemia; Hypertension; Ischemic cardiomyopathy; Lactose intolerance; Myocardial infarction; Respiratory failure ([place]); Rotator cuff tear (RIGHT); and Tobacco use.   He presents to the office today for follow up after hospital admission. He was admitted on 08/24/2016 and discharged on 09/04/2016 for coronary artery disease at which time he had a CABG done. Per cardiology notes:  LIMA-LAD, SVG-D1, and SVG-RI with clipping of the atrial appendage. He required pressor support following surgery with Dopamine, Milrinone, and Norepinephrine. Developed transient episodes of PAF and was started on IV Amiodarone. Pressor support was weaned throughout the hospitalization and IV Amiodarone was switched to PO Amiodarone 463m daily with this being reduced to 2053mdaily prior to discharge due to his QTc being > 500 ms. Was started on Coumadin for his atrial fibrillation. Was also continued on ASA 8150maily, Atorvastatin 80m54mily, Digoxin 0.125mg35mly, Lasix 40mg 30my, and Lopressor 12.5mg BI12mWas not started on an ACE-I/ARB secondary to hypotension and renal insufficiency. Weight at time of discharge was 158 lbs with creatinine of 1.35. Hgb stable at 9.0.  Over all, he reports that he is feeling well. He continues to have slight pain along his sternal incision, this is worse with coughing, which he endorses he has developed a worsening non productive cough over the last 24 hours. He denies any fevers, sinus pain/pressure or chest congestion. He has multiple sick contact at home.   He is walking about a mile per day but endorses frustration as he feels  as though he should be progressing faster.   He feels as though his blood sugars have increased since being home from the hospital. He reports readings in the 270's on a consistent basis. His diet and suffered.   He continues to be smoke free  Wt Readings from Last 3 Encounters:  09/22/16 162 lb 8 oz (73.7 kg)  09/21/16 163 lb (73.9 kg)  09/04/16 158 lb 3.2 oz (71.8 kg)     Review of Systems  Constitutional: Positive for fatigue. Negative for activity change, appetite change, chills, diaphoresis and fever.  HENT: Negative.   Eyes: Negative.   Respiratory: Positive for cough. Negative for chest tightness and shortness of breath.   Cardiovascular: Negative.   Gastrointestinal: Negative.   Musculoskeletal: Negative.   Skin: Positive for wound.  Psychiatric/Behavioral: Negative.    Past Medical History:  Diagnosis Date  . Abrasion L FOREARM  . Atrial fibrillation (HCC)   [place] initially occurring in post-op setting in 05/2016, started on Eliquis b. 08/2016: s/p clipping of atrial appendage at time of CABG --> Eliquis switched to Coumadin  . CAD (coronary artery disease)    a. 05/2016: NSTEMI, cath showing 3v disease with CABG recommended once MRSA infection resolved. b. 08/2016: CABG with LIMA-LAD, SVG-D1, and SVG-RI.  . [place] (HCC)   [place]um[place]al cell carcinoma of the skin  . Diabetes mellitus without complication (HCC)   [place](784.0)   . Hematuria, microscopic "SINCE I WAS A KID"  . Hyperlipidemia   . Hypertension   . Ischemic cardiomyopathy    a. 05/2016: echo w/ EF of 25-35% and akinesis of the basal-midinferolateral and inferior myocardium.  .Marland Kitchen  Lactose intolerance   . Myocardial infarction   . Respiratory failure ([place])   . Rotator cuff tear RIGHT  . Tobacco use    a. quit in 05/2016 following MI    Social History   Social History  . Marital status: Married    Spouse name: N/A  . Number of children: N/A  . Years of education: N/A   Occupational History  . Not on  file.   Social History Main Topics  . Smoking status: Former Smoker    Packs/day: 1.50  . Smokeless tobacco: Never Used     Comment: last use 07/2016  . Alcohol use No  . Drug use: No  . Sexual activity: Yes   Other Topics Concern  . Not on file   Social History Narrative  . No narrative on file    Past Surgical History:  Procedure Laterality Date  . CARDIAC CATHETERIZATION N/A 06/16/2016   Procedure: Left Heart Cath and Coronary Angiography;  Surgeon: Troy Sine, MD;  Location: [facility];  Service: Cardiovascular;  Laterality: N/A;  . CLIPPING OF ATRIAL APPENDAGE N/A 08/24/2016   Procedure: CLIPPING OF ATRIAL APPENDAGE;  Surgeon: Ivin Poot, MD;  Location: [facility];  Service: Open Heart Surgery;  Laterality: N/A;  . CORONARY ARTERY BYPASS GRAFT N/A 08/24/2016   Procedure: CORONARY ARTERY BYPASS GRAFTING (CABG) x three,  using left internal mammary artery and right leg greater saphenous vein harvested endoscopically;  Surgeon: Ivin Poot, MD;  Location: [facility];  Service: Open Heart Surgery;  Laterality: N/A;  . HERNIA REPAIR  X2  . INCISION AND DRAINAGE ABSCESS Left 06/10/2016   Procedure: INCISION AND DRAINAGE LEFT AXILLARY ABSCESS;  Surgeon: Autumn Messing III, MD;  Location: [facility];  Service: General;  Laterality: Left;  . ROTATOR CUFF REPAIR Bilateral   . TEE WITHOUT CARDIOVERSION N/A 08/24/2016   Procedure: TRANSESOPHAGEAL ECHOCARDIOGRAM (TEE);  Surgeon: Ivin Poot, MD;  Location: [facility];  Service: Open Heart Surgery;  Laterality: N/A;    Family History  Problem Relation Age of Onset  . Hypertension Father   . Sudden death Father   . Diabetes Father   . Hypertension Mother   . Heart attack Sister     Allergies  Allergen Reactions  . Latex Rash    Current Outpatient Prescriptions on File Prior to Visit  Medication Sig Dispense Refill  . amiodarone (PACERONE) 200 MG tablet Take 1 tablet (200 mg total) by mouth daily. 30 tablet 1  . aspirin 81 MG  chewable tablet Chew 1 tablet (81 mg total) by mouth daily. 30 tablet 3  . atorvastatin (LIPITOR) 80 MG tablet Take 1 tablet (80 mg total) by mouth at bedtime. 90 tablet 3  . blood glucose meter kit and supplies KIT Dispense based on patient and insurance preference. Use up to four times daily as directed. (FOR ICD-9 250.00, 250.01). 1 each 0  . buPROPion (WELLBUTRIN XL) 150 MG 24 hr tablet Take 1 tablet (150 mg total) by mouth daily. 90 tablet 1  . ferrous [id number] C-folic acid (TRINSICON / FOLTRIN) capsule Take 1 capsule by mouth daily. 30 capsule 1  . furosemide (LASIX) 40 MG tablet Take 1 tablet (40 mg total) by mouth daily. 30 tablet 0  . glipiZIDE (GLUCOTROL) 10 MG tablet Take 1 tablet (10 mg total) by mouth 2 (two) times daily before a meal. 180 tablet 1  . KLOR-CON M20 20 MEQ tablet Take 1 tablet (20 mEq total) by mouth  daily. 30 tablet 1  . metFORMIN (GLUCOPHAGE) 1000 MG tablet Take 1 tablet (1,000 mg total) by mouth 2 (two) times daily with a meal. 180 tablet 3  . metoprolol tartrate (LOPRESSOR) 25 MG tablet Take 1 tablet (25 mg total) by mouth 2 (two) times daily. 60 tablet 11  . ONE TOUCH ULTRA TEST test strip USE UP TO 4 TIMES DAILY AS DIRECTED  2  . ONETOUCH DELICA LANCETS 09D MISC USE UP TO 4 TIMES DAILY AS DIRECTED  2  . oxyCODONE (OXY IR/ROXICODONE) 5 MG immediate release tablet Take 1-2 tablets (5-10 mg total) by mouth every 6 (six) hours as needed for severe pain. 28 tablet 0  . warfarin (COUMADIN) 5 MG tablet Take 1 tablet (5 mg total) by mouth daily at 6 PM. As prescribed by coumadin clinic 100 tablet 1   No current facility-administered medications on file prior to visit.     BP 118/70   Pulse 85   Temp 98.2 F (36.8 C) (Oral)   Ht 5' 6" (1.676 m)   Wt 162 lb 8 oz (73.7 kg)   SpO2 94%   BMI 26.23 kg/m       Objective:   Physical Exam  Constitutional: He is oriented to person, place, and time. He appears well-developed and well-nourished. No distress.    Cardiovascular: Normal rate, regular rhythm, normal heart sounds and intact distal pulses.  Exam reveals no gallop and no friction rub.   No murmur heard. Pulmonary/Chest: Effort normal and breath sounds normal. No respiratory distress. He has no wheezes. He has no rales. He exhibits no tenderness.  Abdominal: Soft. Bowel sounds are normal. He exhibits no distension. There is no tenderness. There is no rebound and no guarding.  Neurological: He is alert and oriented to person, place, and time.  Skin: Skin is warm and dry. He is not diaphoretic.  Well healing wound sternal surgical wound. No erythremia or discharge noted.   Well healing vein harvest site on right leg. He does have a small area of swelling along the harvest site. This area is mobile, not red or warm, or painful to touch. More consistent with hematoma   Psychiatric: He has a normal mood and affect. His behavior is normal. Judgment and thought content normal.  Nursing note and vitals reviewed.     Assessment & Plan:  1. Uncontrolled type 2 diabetes mellitus without complication, without long-term current use of insulin (HCC) - A1c was improved to 7.2 from 8.2  - POC HgB A1c - Ambulatory referral to Podiatry - Continue with current therapy - Follow up in three months   2. Cough - Likelly viral illness - benzonatate (TESSALON) 200 MG capsule; Take 1 capsule (200 mg total) by mouth 2 (two) times daily as needed for cough.  Dispense: 20 capsule; Refill: 0  3. S/P CABG x 3 - Continue with current plan by cardiology and thoracic surgery.  - Continue ASA, BB, and statin    [name], NP

## 2016-09-22 NOTE — Patient Instructions (Addendum)
It was great seeing you today!  You are making great progress. Keep up the good work.   I have prescribed Tessalon Pearls to help with the cough. It is ok for you to use Mucinex.   Your A1c has actually dropped from 8.2 a month ago to 7.2 this month  Follow up with me in 3 months for a diabetes check or sooner if needed. Someone from podiatry will call you to schedule your exam

## 2016-09-24 ENCOUNTER — Telehealth: Payer: Self-pay | Admitting: Adult Health

## 2016-09-24 ENCOUNTER — Telehealth: Payer: Self-pay | Admitting: Emergency Medicine

## 2016-09-24 NOTE — Telephone Encounter (Signed)
Brooke pt would like to have a call back.

## 2016-09-24 NOTE — Telephone Encounter (Signed)
Called and spoke with pt. Pt states that he does not use [facility] and did not request prescription.

## 2016-09-24 NOTE — Telephone Encounter (Signed)
Called and left voicemail for pt to return call to office   Received a refill request from [facility]  For Diclofenac Sodium Gel. Medication is not on pt's medication list. Contacting pt to verify if he made the refill request and if medication is needed.

## 2016-09-28 ENCOUNTER — Ambulatory Visit (INDEPENDENT_AMBULATORY_CARE_PROVIDER_SITE_OTHER): Payer: BLUE CROSS/BLUE SHIELD | Admitting: Pharmacist Clinician (PhC)/ Clinical Pharmacy Specialist

## 2016-09-28 DIAGNOSIS — I4891 Unspecified atrial fibrillation: Secondary | ICD-10-CM

## 2016-09-28 DIAGNOSIS — Z5181 Encounter for therapeutic drug level monitoring: Secondary | ICD-10-CM

## 2016-09-28 LAB — POCT INR: INR: 4.4

## 2016-09-30 ENCOUNTER — Ambulatory Visit (INDEPENDENT_AMBULATORY_CARE_PROVIDER_SITE_OTHER): Payer: Self-pay | Admitting: Physician Assistant

## 2016-09-30 ENCOUNTER — Other Ambulatory Visit: Payer: Self-pay | Admitting: *Deleted

## 2016-09-30 ENCOUNTER — Ambulatory Visit: Payer: BLUE CROSS/BLUE SHIELD | Admitting: Cardiothoracic Surgery

## 2016-09-30 ENCOUNTER — Ambulatory Visit
Admission: RE | Admit: 2016-09-30 | Discharge: 2016-09-30 | Disposition: A | Discharge status: Home / Self Care | Payer: BLUE CROSS/BLUE SHIELD | Source: Ambulatory Visit | Attending: Thoracic Surgery (Cardiothoracic Vascular Surgery) | Admitting: Thoracic Surgery (Cardiothoracic Vascular Surgery)

## 2016-09-30 VITALS — BP 126/83 | HR 86 | Resp 20 | Ht 66.0 in | Wt 162.0 lb

## 2016-09-30 DIAGNOSIS — Z951 Presence of aortocoronary bypass graft: Secondary | ICD-10-CM

## 2016-09-30 DIAGNOSIS — I2510 Atherosclerotic heart disease of native coronary artery without angina pectoris: Secondary | ICD-10-CM

## 2016-09-30 NOTE — Progress Notes (Signed)
OPERATION: 1. Coronary artery bypass grafting x3 (left internal mammary artery to LAD, saphenous vein graft to ramus intermediate, saphenous vein graft to diagonal). 2. Endoscopic harvest of right leg greater saphenous vein.  HPI: Patient returns for routine postoperative follow-up having undergone a CABG x 3 by Dr. Prescott Gum on 08/24/2016. The patient's early postoperative recovery while in the hospital was notable for atrial fibrillation (had a fib prior to surgery). Since hospital discharge, the patient reports he has occasional cough. He had his INR checked 12/11 and it was 4.4. [place] gave him instructions for Coumadin and his INR is to be checked on 12/18.   Current Outpatient Prescriptions  Medication Sig Dispense Refill  . amiodarone (PACERONE) 200 MG tablet Take 1 tablet (200 mg total) by mouth daily. 30 tablet 1  . aspirin 81 MG chewable tablet Chew 1 tablet (81 mg total) by mouth daily. 30 tablet 3  . atorvastatin (LIPITOR) 80 MG tablet Take 1 tablet (80 mg total) by mouth at bedtime. 90 tablet 3  . benzonatate (TESSALON) 200 MG capsule Take 1 capsule (200 mg total) by mouth 2 (two) times daily as needed for cough. 20 capsule 0  . blood glucose meter kit and supplies KIT Dispense based on patient and insurance preference. Use up to four times daily as directed. (FOR ICD-9 250.00, 250.01). 1 each 0  . buPROPion (WELLBUTRIN XL) 150 MG 24 hr tablet Take 1 tablet (150 mg total) by mouth daily. 90 tablet 1  . ferrous [id number] C-folic acid (TRINSICON / FOLTRIN) capsule Take 1 capsule by mouth daily. 30 capsule 1  . furosemide (LASIX) 40 MG tablet Take 1 tablet (40 mg total) by mouth daily. 30 tablet 0  . glipiZIDE (GLUCOTROL) 10 MG tablet Take 1 tablet (10 mg total) by mouth 2 (two) times daily before a meal. 180 tablet 1  . KLOR-CON M20 20 MEQ tablet Take 1 tablet (20 mEq total) by mouth daily. 30 tablet 1  . metFORMIN (GLUCOPHAGE) 1000 MG tablet Take 1 tablet  (1,000 mg total) by mouth 2 (two) times daily with a meal. 180 tablet 3  . metoprolol tartrate (LOPRESSOR) 25 MG tablet Take 1 tablet (25 mg total) by mouth 2 (two) times daily. 60 tablet 11  . ONE TOUCH ULTRA TEST test strip USE UP TO 4 TIMES DAILY AS DIRECTED  2  . ONETOUCH DELICA LANCETS 51W MISC USE UP TO 4 TIMES DAILY AS DIRECTED  2  . oxyCODONE (OXY IR/ROXICODONE) 5 MG immediate release tablet Take 1-2 tablets (5-10 mg total) by mouth every 6 (six) hours as needed for severe pain. 28 tablet 0  . warfarin (COUMADIN) 5 MG tablet Take 1 tablet (5 mg total) by mouth daily at 6 PM. As prescribed by coumadin clinic 100 tablet 1  Vital Signs: BP 126/83, HR 86, RR 20, Oxygen saturation 99% on room air   Physical Exam: CV-Slightly tachycardic on exam Pulmonary-Clear to auscultation bilaterally Extremities-No LE edema Wounds-Sternal wound is well healed and there is no instability. Eschar removed from left chest tube site. There is an eschar on his RLE wound as well. No sign of infection.  Diagnostic Tests: CLINICAL DATA:  CABG.  EXAM: CHEST  2 VIEW  COMPARISON:  09/01/2016 .  FINDINGS: Prior CABG. Left atrial appendage clip. Cardiomegaly with normal pulmonary vascularity. No focal alveolar infiltrate. No pleural effusion or pneumothorax. Degenerative changes thoracic spine .  IMPRESSION: 1. Prior CABG.  Left atrial appendage clip noted.  2. Cardiomegaly.  No  pulmonary venous congestion .   Electronically Signed   By: Marcello Moores  Register   On: 09/30/2016 11:01  Impression and Plan: Overall, Mr Buckle is recovering well from coronary artery bypass grafting surgery. He has already seen a provider from cardiology and is scheduled to see Dr. Debara Pickett on 12/07/2016. He stated he had lab work drawn at the beginning of December but has yet to hear about the results. I informed him of the results and also printed him a copy of his BMET and CBC. Regarding changes made to his medications,  he was instructed to stop Trinsicon, Lasix, and potassium. He states his blood sugars have been good. He has recently seen [facility] Quint and said his HGA1C is down to 7.2. He was instructed that as long as he is not taking medicine for pain, he may begin driving short distances (30 minutes or less during the day) for one week. He may gradually increase his frequency and duration as tolerates. He wishes to participate in cardiac rehab so we will help arrange that at [facility]. He was instructed to not lift more than 15 pounds for the next 3-4 weeks. He was inquiring about when he will be able to return to work. Dr. Prescott Gum explained to him that the sternum needs more time to completely heal. Patient will return at the end of January to be seen and hopefully released to return to work.     Nani Skillern, PA-C [facility] [phone]

## 2016-09-30 NOTE — Patient Instructions (Addendum)
Make every effort to keep your diabetes under very tight control.  Follow up closely with your primary care physician or endocrinologist and strive to keep their hemoglobin A1c levels as low as possible, preferably near or below 6.0.  Your HGA1C pre op was 8.2. The long term benefits of strict control of diabetes are far reaching and critically important for your overall health and survival.  You may return to driving an automobile as long as you are no longer requiring oral narcotic pain relievers during the daytime.  It would be wise to start driving only short distances during the daylight and gradually increase from there as you feel comfortable.  Continue to avoid any heavy lifting or strenuous use of your arms or shoulders for at least a total of three months from the time of surgery.  After three months, you may gradually increase how much you lift or otherwise use your arms or chest as tolerated, with limits based upon whether or not activities lead to the return of significant discomfort.

## 2016-10-01 ENCOUNTER — Other Ambulatory Visit: Payer: Self-pay | Admitting: Emergency Medicine

## 2016-10-01 NOTE — Telephone Encounter (Signed)
Called and spoke with patient about requests from [facility], pt stated that he does not know that Pharmacy and he doesn't not need the Blood Glucose Meter kit and supplies kit. Also pt doesnt use Calcipotriene cream and is not familiar with the cream at all.  Pt stated that he has Blood Glucose Meter kit and supplies and is using daily as directed.

## 2016-10-02 ENCOUNTER — Ambulatory Visit: Payer: BLUE CROSS/BLUE SHIELD | Admitting: Internal Medicine

## 2016-10-05 ENCOUNTER — Ambulatory Visit (INDEPENDENT_AMBULATORY_CARE_PROVIDER_SITE_OTHER): Payer: BLUE CROSS/BLUE SHIELD | Admitting: Pharmacist Clinician (PhC)/ Clinical Pharmacy Specialist

## 2016-10-05 DIAGNOSIS — Z5181 Encounter for therapeutic drug level monitoring: Secondary | ICD-10-CM

## 2016-10-05 DIAGNOSIS — I4891 Unspecified atrial fibrillation: Secondary | ICD-10-CM | POA: Diagnosis not present

## 2016-10-05 LAB — POCT INR: INR: 3.1

## 2016-10-05 MED ORDER — WARFARIN SODIUM 5 MG PO TABS: 5.0000 mg | ORAL_TABLET | Freq: Every day | ORAL | 1 refills | Status: DC

## 2016-10-07 ENCOUNTER — Encounter: Payer: Self-pay | Admitting: *Deleted

## 2016-10-09 ENCOUNTER — Encounter: Payer: Self-pay | Admitting: Adult Health

## 2016-10-09 ENCOUNTER — Ambulatory Visit: Payer: BLUE CROSS/BLUE SHIELD | Admitting: Adult Health

## 2016-10-09 ENCOUNTER — Ambulatory Visit (INDEPENDENT_AMBULATORY_CARE_PROVIDER_SITE_OTHER): Payer: BLUE CROSS/BLUE SHIELD | Admitting: Adult Health

## 2016-10-09 VITALS — BP 142/70 | Temp 98.1°F | Ht 66.0 in | Wt 169.0 lb

## 2016-10-09 DIAGNOSIS — F329 Major depressive disorder, single episode, unspecified: Secondary | ICD-10-CM

## 2016-10-09 DIAGNOSIS — R251 Tremor, unspecified: Secondary | ICD-10-CM

## 2016-10-09 MED ORDER — HYDROXYZINE HCL 10 MG PO TABS: 10.0000 mg | ORAL_TABLET | Freq: Three times a day (TID) | ORAL | 0 refills | Status: DC | PRN

## 2016-10-09 MED ORDER — BUPROPION HCL ER (XL) 300 MG PO TB24: 300.0000 mg | ORAL_TABLET | Freq: Every day | ORAL | 1 refills | Status: DC

## 2016-10-09 NOTE — Progress Notes (Addendum)
Subjective:    Patient ID: [name], male    DOB: 10-23-1960, 55 y.o.   MRN: [id number]  HPI  55 year old male who presents to the office today for worsening tremors in all extremities. He reports that the tremors started to get bad after his MI. " I have always had a little tremor but after the heart attack the tremors got worse. I can't even write my name anymore and sometimes feeding myself is hard." He continues " multiple times throughout the day I will have these severe jerking episodes with my hands and feet. It is like they just jerk a few times and it is over." The only thing he is found that helps with the tremors is that if he takes a drag of a cigarette.   He also has been feeling more depressed lately. He does report some issues with his wife but will not go into detail. He knows that depression after a major health event is common. He states " I just feel over whelmed". He is currently taking Wellbutrin 171m XR.   He is tearful during exam   Review of Systems  Constitutional: Negative.   Respiratory: Negative.   Cardiovascular: Negative.   Genitourinary: Negative.   Neurological: Positive for tremors.  Psychiatric/Behavioral: Negative for self-injury and suicidal ideas.       Depressed   All other systems reviewed and are negative.  Past Medical History:  Diagnosis Date  . Abrasion L FOREARM  . Atrial fibrillation ([place]    a. initially occurring in post-op setting in 05/2016, started on Eliquis b. 08/2016: s/p clipping of atrial appendage at time of CABG --> Eliquis switched to Coumadin  . CAD (coronary artery disease)    a. 05/2016: NSTEMI, cath showing 3v disease with CABG recommended once MRSA infection resolved. b. 08/2016: CABG with LIMA-LAD, SVG-D1, and SVG-RI.  .[place]    basal cell carcinoma of the skin  . Diabetes mellitus without complication ([facility]   . Headache(784.0)   . Hematuria, microscopic "SINCE I WAS A KID"  . Hyperlipidemia   .  Hypertension   . Ischemic cardiomyopathy    a. 05/2016: echo w/ EF of 25-35% and akinesis of the basal-midinferolateral and inferior myocardium.  . Lactose intolerance   . Myocardial infarction   . Respiratory failure ([place]   . Rotator cuff tear RIGHT  . Tobacco use    a. quit in 05/2016 following MI    Social History   Social History  . Marital status: Married    Spouse name: N/A  . Number of children: N/A  . Years of education: N/A   Occupational History  . Not on file.   Social History Main Topics  . Smoking status: Former Smoker    Packs/day: 1.50  . Smokeless tobacco: Never Used     Comment: last use 07/2016  . Alcohol use No  . Drug use: No  . Sexual activity: Yes   Other Topics Concern  . Not on file   Social History Narrative  . No narrative on file    Past Surgical History:  Procedure Laterality Date  . CARDIAC CATHETERIZATION N/A 06/16/2016   Procedure: Left Heart Cath and Coronary Angiography;  Surgeon: TTroy Sine MD;  [facility];  Service: Cardiovascular;  Laterality: N/A;  . CLIPPING OF ATRIAL APPENDAGE N/A 08/24/2016   Procedure: CLIPPING OF ATRIAL APPENDAGE;  Surgeon: PIvin Poot MD;  [facility]  Service:  Open Heart Surgery;  Laterality: N/A;  . CORONARY ARTERY BYPASS GRAFT N/A 08/24/2016   Procedure: CORONARY ARTERY BYPASS GRAFTING (CABG) x three,  using left internal mammary artery and right leg greater saphenous vein harvested endoscopically;  Surgeon: Ivin Poot, MD;  Location: [facility];  Service: Open Heart Surgery;  Laterality: N/A;  . HERNIA REPAIR  X2  . INCISION AND DRAINAGE ABSCESS Left 06/10/2016   Procedure: INCISION AND DRAINAGE LEFT AXILLARY ABSCESS;  Surgeon: Autumn Messing III, MD;  Location: [facility];  Service: General;  Laterality: Left;  . ROTATOR CUFF REPAIR Bilateral   . TEE WITHOUT CARDIOVERSION N/A 08/24/2016   Procedure: TRANSESOPHAGEAL ECHOCARDIOGRAM (TEE);  Surgeon: Ivin Poot, MD;  Location: [facility];  Service: Open Heart Surgery;  Laterality: N/A;    Family History  Problem Relation Age of Onset  . Hypertension Father   . Sudden death Father   . Diabetes Father   . Hypertension Mother   . Heart attack Sister     Allergies  Allergen Reactions  . Latex Rash    Current Outpatient Prescriptions on File Prior to Visit  Medication Sig Dispense Refill  . amiodarone (PACERONE) 200 MG tablet Take 1 tablet (200 mg total) by mouth daily. 30 tablet 1  . aspirin 81 MG chewable tablet Chew 1 tablet (81 mg total) by mouth daily. 30 tablet 3  . atorvastatin (LIPITOR) 80 MG tablet Take 1 tablet (80 mg total) by mouth at bedtime. 90 tablet 3  . benzonatate (TESSALON) 200 MG capsule Take 1 capsule (200 mg total) by mouth 2 (two) times daily as needed for cough. 20 capsule 0  . blood glucose meter kit and supplies KIT Dispense based on patient and insurance preference. Use up to four times daily as directed. (FOR ICD-9 250.00, 250.01). 1 each 0  . buPROPion (WELLBUTRIN XL) 150 MG 24 hr tablet Take 1 tablet (150 mg total) by mouth daily. 90 tablet 1  . glipiZIDE (GLUCOTROL) 10 MG tablet Take 1 tablet (10 mg total) by mouth 2 (two) times daily before a meal. 180 tablet 1  . metFORMIN (GLUCOPHAGE) 1000 MG tablet Take 1 tablet (1,000 mg total) by mouth 2 (two) times daily with a meal. 180 tablet 3  . metoprolol tartrate (LOPRESSOR) 25 MG tablet Take 1 tablet (25 mg total) by mouth 2 (two) times daily. 60 tablet 11  . ONE TOUCH ULTRA TEST test strip USE UP TO 4 TIMES DAILY AS DIRECTED  2  . ONETOUCH DELICA LANCETS 89F MISC USE UP TO 4 TIMES DAILY AS DIRECTED  2  . oxyCODONE (OXY IR/ROXICODONE) 5 MG immediate release tablet Take 1-2 tablets (5-10 mg total) by mouth every 6 (six) hours as needed for severe pain. 28 tablet 0  . warfarin (COUMADIN) 5 MG tablet Take 1 tablet (5 mg total) by mouth daily at 6 PM. As prescribed by coumadin clinic 100 tablet 1   No current facility-administered medications on  file prior to visit.     BP (!) 142/70   Temp 98.1 F (36.7 C) (Oral)   Ht '5\' 6"'$  (1.676 m)   Wt 169 lb (76.7 kg)   BMI 27.28 kg/m       Objective:   Physical Exam  Constitutional: He is oriented to person, place, and time. He appears well-developed and well-nourished. No distress.  Cardiovascular: Normal rate, regular rhythm, normal heart sounds and intact distal pulses.  Exam reveals no gallop and no friction rub.   No  murmur heard. Pulmonary/Chest: Effort normal and breath sounds normal. No respiratory distress. He has no wheezes. He has no rales. He exhibits no tenderness.  Neurological: He is alert and oriented to person, place, and time. He has normal strength. He displays tremor. No sensory deficit.  No cogwheel   Skin: Skin is warm and dry. No rash noted. He is not diaphoretic. No erythema. No pallor.  Psychiatric: He has a normal mood and affect. His behavior is normal. Judgment and thought content normal.  Nursing note and vitals reviewed.     Assessment & Plan:  1. Tremors of nervous system - Doubt it is medication related or withdrawal symptoms. He is on amiodarone but is also on a BB.  - Ambulatory referral to Neurology  2. Reactive depression - Will increase Wellbutrin and he can take Atarax as needed.  - buPROPion (WELLBUTRIN XL) 300 MG 24 hr tablet; Take 1 tablet (300 mg total) by mouth daily.  Dispense: 90 tablet; Refill: 1 - hydrOXYzine (ATARAX/VISTARIL) 10 MG tablet; Take 1 tablet (10 mg total) by mouth 3 (three) times daily as needed.  Dispense: 30 tablet; Refill: 0 - Follow up if no improvement - Go to the ER with any thoughts of SI or self harm   [name], NP

## 2016-10-13 ENCOUNTER — Telehealth: Payer: Self-pay | Admitting: Adult Health

## 2016-10-13 NOTE — Telephone Encounter (Signed)
Pt need new Rx for Oxycodone  Pt is aware of the 3 business days for refills.

## 2016-10-14 ENCOUNTER — Other Ambulatory Visit: Payer: Self-pay

## 2016-10-14 ENCOUNTER — Ambulatory Visit (INDEPENDENT_AMBULATORY_CARE_PROVIDER_SITE_OTHER): Payer: BLUE CROSS/BLUE SHIELD | Admitting: Pharmacist

## 2016-10-14 DIAGNOSIS — Z5181 Encounter for therapeutic drug level monitoring: Secondary | ICD-10-CM

## 2016-10-14 DIAGNOSIS — I4891 Unspecified atrial fibrillation: Secondary | ICD-10-CM | POA: Diagnosis not present

## 2016-10-14 LAB — POCT INR: INR: 2.2

## 2016-10-14 MED ORDER — OXYCODONE HCL 5 MG PO TABS: 5.0000 mg | ORAL_TABLET | Freq: Four times a day (QID) | ORAL | 0 refills | Status: DC | PRN

## 2016-10-14 NOTE — Telephone Encounter (Signed)
Ok to refill 

## 2016-10-14 NOTE — Telephone Encounter (Signed)
Is he still needing this? If so I will give him 10 pills

## 2016-10-15 NOTE — Telephone Encounter (Signed)
Rx has been printed and signed and placed up front for pick up. Patient has been notified and verbalized understanding. Thanks!

## 2016-10-16 ENCOUNTER — Ambulatory Visit (INDEPENDENT_AMBULATORY_CARE_PROVIDER_SITE_OTHER): Payer: BLUE CROSS/BLUE SHIELD

## 2016-10-16 ENCOUNTER — Ambulatory Visit (INDEPENDENT_AMBULATORY_CARE_PROVIDER_SITE_OTHER): Payer: BLUE CROSS/BLUE SHIELD | Admitting: Podiatry

## 2016-10-16 ENCOUNTER — Encounter: Payer: Self-pay | Admitting: Podiatry

## 2016-10-16 VITALS — Temp 98.0°F | Resp 16 | Ht 66.0 in | Wt 165.0 lb

## 2016-10-16 DIAGNOSIS — L02619 Cutaneous abscess of unspecified foot: Secondary | ICD-10-CM | POA: Diagnosis not present

## 2016-10-16 DIAGNOSIS — M79674 Pain in right toe(s): Secondary | ICD-10-CM

## 2016-10-16 DIAGNOSIS — L03119 Cellulitis of unspecified part of limb: Secondary | ICD-10-CM | POA: Diagnosis not present

## 2016-10-16 MED ORDER — CEPHALEXIN 500 MG PO CAPS: 500.0000 mg | ORAL_CAPSULE | Freq: Three times a day (TID) | ORAL | 1 refills | Status: DC

## 2016-10-16 MED ORDER — SULFAMETHOXAZOLE-TRIMETHOPRIM 800-160 MG PO TABS: 1.0000 | ORAL_TABLET | Freq: Two times a day (BID) | ORAL | 0 refills | Status: DC

## 2016-10-16 NOTE — Progress Notes (Signed)
Subjective:     Patient ID: [name], male   DOB: [birth year], 55 y.o.   MRN: [id number]  HPI patient states that he's develop problems with the big toe in the second toe on his right foot and that he had his foot in front of the heater and he just had surgery in November and he does not feel his foot well do to long-term diabetes   Review of Systems  All other systems reviewed and are negative.      Objective:   Physical Exam  Constitutional: He is oriented to person, place, and time.  Cardiovascular: Intact distal pulses.   Musculoskeletal: Normal range of motion.  Neurological: He is oriented to person, place, and time.  Skin: Skin is warm and dry.  Nursing note and vitals reviewed.  vascular status was found to be intact with neurological diminished both sharp dull and vibratory. Patient's found to have irritation of the distal right hallux second toe with mild breakdown around the hallux localized in nature with no proximal edema erythema drainage noted no streaking or no systemic signs of infection with normal temperature. Patient just had heart attack and had bypass surgery in November and has recovered but is still in recovery mode for this     Assessment:     Localized abscess right hallux distal localized in nature with no indication of proximal spread    Plan:     H&P and x-ray reviewed and at this time using stills mentation I debrided distal tissue did not note active drainage applied sterile dressing with Silvadene bandage and instructed on the beginning to soak and 24 hours and not wearing shoes against this area. He will not utilized a portable heater he was using and I gave him strict instructions of any proximal edema erythema or drainage were to occur he is to go straight to the emergency room and due to his history of MRSA I did as precautionary measure placed him on Septra DS  X-ray report indicated no indications there's any ostial lysis or lack of bone  structure

## 2016-10-20 ENCOUNTER — Telehealth: Payer: Self-pay | Admitting: *Deleted

## 2016-10-20 NOTE — Telephone Encounter (Addendum)
Jamie states nurse at the visit stated she needed to change the rx and both the keflex and the septra DS was filled, which one is he to take. I reviewed the orders and the clinicals and pt was to take the Septra DS. Roselyn Reef then stated that pt was on Coumadin and the septra DS may conflict with his coumadin. I told her that he was on a short-term dose but I would inform Dr. Paulla Dolly and call again. 10/21/2016-Informed pt's dtr, Devonne Doughty of Dr. Paulla Dolly order to have pt remain on Septra DS and to have coumadin monitored closely. Wende Crease states pt has an appt with his doctor on 10/23/2016.

## 2016-10-21 NOTE — Telephone Encounter (Signed)
If they can monitor coumadin a little closer would be nice to keep him on septra

## 2016-10-23 ENCOUNTER — Ambulatory Visit (INDEPENDENT_AMBULATORY_CARE_PROVIDER_SITE_OTHER): Payer: BLUE CROSS/BLUE SHIELD | Admitting: Pharmacist

## 2016-10-23 DIAGNOSIS — I4891 Unspecified atrial fibrillation: Secondary | ICD-10-CM

## 2016-10-23 DIAGNOSIS — Z5181 Encounter for therapeutic drug level monitoring: Secondary | ICD-10-CM | POA: Diagnosis not present

## 2016-10-23 LAB — POCT INR: INR: 2

## 2016-10-26 ENCOUNTER — Encounter (HOSPITAL_COMMUNITY): Payer: Self-pay | Admitting: Cardiothoracic Surgery

## 2016-10-26 ENCOUNTER — Encounter: Payer: BLUE CROSS/BLUE SHIELD | Attending: Internal Medicine | Admitting: *Deleted

## 2016-10-26 VITALS — Ht 65.5 in | Wt 165.8 lb

## 2016-10-26 DIAGNOSIS — Z951 Presence of aortocoronary bypass graft: Secondary | ICD-10-CM | POA: Insufficient documentation

## 2016-10-26 DIAGNOSIS — I2510 Atherosclerotic heart disease of native coronary artery without angina pectoris: Secondary | ICD-10-CM | POA: Diagnosis not present

## 2016-10-26 NOTE — Addendum Note (Signed)
Addendum  created 10/26/16 1156 by Josephine Igo, CRNA   Anesthesia Event edited, Anesthesia Staff edited

## 2016-10-26 NOTE — Progress Notes (Signed)
Daily Session Note  Patient Details  Name: [name] MRN: [id number] Date of Birth: [birth year] Referring Provider:    Encounter Date: 10/26/2016  Check In:     Session Check In - 10/26/16 1226      Check-In   Location [facility]   Staff Present Heath Lark, RN, BSN, CCRP;Evelynn Hench, RN, Levie Heritage, MA, ACSM RCEP, Exercise Physiologist   Supervising physician immediately available to respond to emergencies See telemetry face sheet for immediately available ER MD   Medication changes reported     No   Fall or balance concerns reported    No   Warm-up and Cool-down Performed as group-led instruction   Resistance Training Performed Yes   VAD Patient? No     Pain Assessment   Currently in Pain? No/denies         Goals Met:  Personal goals reviewed  Goals Unmet:  Not Applicable  Comments:     Dr. Emily Filbert is Medical Director for [place] and [facility].

## 2016-10-26 NOTE — Patient Instructions (Signed)
Patient Instructions  Patient Details  Name: [name] MRN: [id number] Date of Birth: 1960/12/17 Referring Provider:  Pixie Casino, MD  Below are the personal goals you chose as well as exercise and nutrition goals. Our goal is to help you keep on track towards obtaining and maintaining your goals. We will be discussing your progress on these goals with you throughout the program.  Initial Exercise Prescription:     Initial Exercise Prescription - 10/26/16 1400      Date of Initial Exercise RX and Referring Provider   Date 10/26/16   Referring Provider Octaviano Glow MD     Treadmill   MPH 2.5   Grade 2   Minutes 15   METs 3.6     REL-XR   Level 3   Watts --  speed >50   Minutes 15   METs 3     T5 Nustep   Level 3   Watts --  80-100 spm   Minutes 15   METs 3     Prescription Details   Frequency (times per week) 3   Duration Progress to 45 minutes of aerobic exercise without signs/symptoms of physical distress     Intensity   THRR 40-80% of Max Heartrate 112-147   Ratings of Perceived Exertion 11-15   Perceived Dyspnea 0-4     Progression   Progression Continue to progress workloads to maintain intensity without signs/symptoms of physical distress.     Resistance Training   Training Prescription Yes   Weight 3 lbs   Reps 10-15      Exercise Goals: Frequency: Be able to perform aerobic exercise three times per week working toward 3-5 days per week.  Intensity: Work with a perceived exertion of 11 (fairly light) - 15 (hard) as tolerated. Follow your new exercise prescription and watch for changes in prescription as you progress with the program. Changes will be reviewed with you when they are made.  Duration: You should be able to do 30 minutes of continuous aerobic exercise in addition to a 5 minute warm-up and a 5 minute cool-down routine.  Nutrition Goals: Your personal nutrition goals will be established when you do your nutrition analysis with  the dietician.  The following are nutrition guidelines to follow: Cholesterol < 200mg /day Sodium < 1500mg /day Fiber: Men over 50 yrs - 30 grams per day  Personal Goals:     Personal Goals and Risk Factors at Admission - 10/26/16 1234      Core Components/Risk Factors/Patient Goals on Admission    Weight Management Yes;Weight Maintenance   Intervention Weight Management: Develop a combined nutrition and exercise program designed to reach desired caloric intake, while maintaining appropriate intake of nutrient and fiber, sodium and fats, and appropriate energy expenditure required for the weight goal.;Weight Management: Provide education and appropriate resources to help participant work on and attain dietary goals.   Admit Weight 165 lb 12.8 oz (75.2 kg)   Goal Weight: Short Term 165 lb (74.8 kg)   Goal Weight: Long Term 162 lb (73.5 kg)   Expected Outcomes Long Term: Adherence to nutrition and physical activity/exercise program aimed toward attainment of established weight goal;Short Term: Continue to assess and modify interventions until short term weight is achieved;Weight Maintenance: Understanding of the daily nutrition guidelines, which includes 25-35% calories from fat, 7% or less cal from saturated fats, less than 200mg  cholesterol, less than 1.5gm of sodium, & 5 or more servings of fruits and vegetables daily   Increase Strength  and Stamina Yes   Intervention Provide advice, education, support and counseling about physical activity/exercise needs.;Develop an individualized exercise prescription for aerobic and resistive training based on initial evaluation findings, risk stratification, comorbidities and participant's personal goals.   Expected Outcomes Achievement of increased cardiorespiratory fitness and enhanced flexibility, muscular endurance and strength shown through measurements of functional capacity and personal statement of participant.   Tobacco Cessation Yes   Intervention  Assist the participant in steps to quit. Provide individualized education and counseling about committing to Tobacco Cessation, relapse prevention, and pharmacological support that can be provided by physician.;Advice worker, assist with locating and accessing local/national Quit Smoking programs, and support quit date choice.   Expected Outcomes Long Term: Complete abstinence from all tobacco products for at least 12 months from quit date.   Diabetes Yes   Intervention Provide education about signs/symptoms and action to take for hypo/hyperglycemia.;Provide education about proper nutrition, including hydration, and aerobic/resistive exercise prescription along with prescribed medications to achieve blood glucose in normal ranges: Fasting glucose 65-99 mg/dL   Expected Outcomes Short Term: Participant verbalizes understanding of the signs/symptoms and immediate care of hyper/hypoglycemia, proper foot care and importance of medication, aerobic/resistive exercise and nutrition plan for blood glucose control.;Long Term: Attainment of HbA1C < 7%.   Hypertension Yes   Intervention Provide education on lifestyle modifcations including regular physical activity/exercise, weight management, moderate sodium restriction and increased consumption of fresh fruit, vegetables, and low fat dairy, alcohol moderation, and smoking cessation.;Monitor prescription use compliance.   Expected Outcomes Short Term: Continued assessment and intervention until BP is < 140/64mm HG in hypertensive participants. < 130/86mm HG in hypertensive participants with diabetes, heart failure or chronic kidney disease.;Long Term: Maintenance of blood pressure at goal levels.   Lipids Yes   Intervention Provide education and support for participant on nutrition & aerobic/resistive exercise along with prescribed medications to achieve LDL 70mg , HDL >40mg .   Expected Outcomes Short Term: Participant states understanding of desired  cholesterol values and is compliant with medications prescribed. Participant is following exercise prescription and nutrition guidelines.;Long Term: Cholesterol controlled with medications as prescribed, with individualized exercise RX and with personalized nutrition plan. Value goals: LDL < 70mg , HDL > 40 mg.   Stress Yes   Intervention Offer individual and/or small group education and counseling on adjustment to heart disease, stress management and health-related lifestyle change. Teach and support self-help strategies.;Refer participants experiencing significant psychosocial distress to appropriate mental health specialists for further evaluation and treatment. When possible, include family members and significant others in education/counseling sessions.   Expected Outcomes Short Term: Participant demonstrates changes in health-related behavior, relaxation and other stress management skills, ability to obtain effective social support, and compliance with psychotropic medications if prescribed.;Long Term: Emotional wellbeing is indicated by absence of clinically significant psychosocial distress or social isolation.   Personal Goal Other Yes   Personal Goal --  Jsoeph is separated from his wife but his daughter was here today and was very supportive. Natavius said he is a [place] but lately has been depressed and usually sees the positive in life. Jada's daughter emphasized and he added that this all started wti   Intervention Use of exercise equipment in [place]. Opportunity to meet with Mental Health Counselor on staff.   Expected Outcomes To discuss with his doctor his limitations after his CABG 09/02/2016 but hopefully be able to get his stamina back for work.       Tobacco Use Initial Evaluation: History  Smoking Status  .  Former Smoker  . Packs/day: 1.50  Smokeless Tobacco  . Never Used    Comment: last use 07/2016    Copy of goals given to participant.

## 2016-10-26 NOTE — Progress Notes (Addendum)
Cardiac Individual Treatment Plan  Patient Details  Name: [name] MRN: [id number] Date of Birth: [birth year] Referring Provider:   Flowsheet Row Cardiac Rehab from 10/26/2016 in [facility] Cardiac and Pulmonary Rehab  Referring Provider  Lyman Bishop MD      Initial Encounter Date:  Flowsheet Row Cardiac Rehab from 10/26/2016 in [facility] Cardiac and Pulmonary Rehab  Date  10/26/16  Referring Provider  Lyman Bishop MD    10/26/2016  Visit Diagnosis: S/P CABG x 3  Patient's Home Medications on Admission:  Current Outpatient Prescriptions:  .  amiodarone (PACERONE) 200 MG tablet, Take 1 tablet (200 mg total) by mouth daily., Disp: 30 tablet, Rfl: 1 .  aspirin 81 MG chewable tablet, Chew 1 tablet (81 mg total) by mouth daily., Disp: 30 tablet, Rfl: 3 .  atorvastatin (LIPITOR) 80 MG tablet, Take 1 tablet (80 mg total) by mouth at bedtime., Disp: 90 tablet, Rfl: 3 .  buPROPion (WELLBUTRIN XL) 300 MG 24 hr tablet, Take 1 tablet (300 mg total) by mouth daily., Disp: 90 tablet, Rfl: 1 .  glipiZIDE (GLUCOTROL) 10 MG tablet, Take 1 tablet (10 mg total) by mouth 2 (two) times daily before a meal., Disp: 180 tablet, Rfl: 1 .  hydrOXYzine (ATARAX/VISTARIL) 10 MG tablet, Take 1 tablet (10 mg total) by mouth 3 (three) times daily as needed., Disp: 30 tablet, Rfl: 0 .  metFORMIN (GLUCOPHAGE) 1000 MG tablet, Take 1 tablet (1,000 mg total) by mouth 2 (two) times daily with a meal., Disp: 180 tablet, Rfl: 3 .  metoprolol tartrate (LOPRESSOR) 25 MG tablet, Take 1 tablet (25 mg total) by mouth 2 (two) times daily., Disp: 60 tablet, Rfl: 11 .  ONE TOUCH ULTRA TEST test strip, USE UP TO 4 TIMES DAILY AS DIRECTED, Disp: , Rfl: 2 .  ONETOUCH DELICA LANCETS 10X MISC, USE UP TO 4 TIMES DAILY AS DIRECTED, Disp: , Rfl: 2 .  oxyCODONE (OXY IR/ROXICODONE) 5 MG immediate release tablet, Take 1-2 tablets (5-10 mg total) by mouth every 6 (six) hours as needed for severe pain., Disp: 10 tablet, Rfl: 0 .  warfarin (COUMADIN)  5 MG tablet, Take 1 tablet (5 mg total) by mouth daily at 6 PM. As prescribed by coumadin clinic, Disp: 100 tablet, Rfl: 1 .  benzonatate (TESSALON) 200 MG capsule, Take 1 capsule (200 mg total) by mouth 2 (two) times daily as needed for cough. (Patient not taking: Reported on 10/26/2016), Disp: 20 capsule, Rfl: 0 .  blood glucose meter kit and supplies KIT, Dispense based on patient and insurance preference. Use up to four times daily as directed. (FOR ICD-9 250.00, 250.01)., Disp: 1 each, Rfl: 0 .  sulfamethoxazole-trimethoprim (BACTRIM DS,SEPTRA DS) 800-160 MG tablet, Take 1 tablet by mouth 2 (two) times daily., Disp: 20 tablet, Rfl: 0  Past Medical History: Past Medical History:  Diagnosis Date  . Abrasion L FOREARM  . Atrial fibrillation ([place])    a. initially occurring in post-op setting in 05/2016, started on Eliquis b. 08/2016: s/p clipping of atrial appendage at time of CABG --> Eliquis switched to Coumadin  . CAD (coronary artery disease)    a. 05/2016: NSTEMI, cath showing 3v disease with CABG recommended once MRSA infection resolved. b. 08/2016: CABG with LIMA-LAD, SVG-D1, and SVG-RI.  Marland Kitchen Cancer ([place])    basal cell carcinoma of the skin  . Diabetes mellitus without complication ([place])   . Headache(784.0)   . Hematuria, microscopic "SINCE I WAS A KID"  . Hyperlipidemia   .  Hypertension   . Ischemic cardiomyopathy    a. 05/2016: echo w/ EF of 25-35% and akinesis of the basal-midinferolateral and inferior myocardium.  . Lactose intolerance   . Myocardial infarction   . Respiratory failure ([place])   . Rotator cuff tear RIGHT  . Tobacco use    a. quit in 05/2016 following MI    Tobacco Use: History  Smoking Status  . Former Smoker  . Packs/day: 1.50  Smokeless Tobacco  . Never Used    Comment: last use 07/2016    Labs: Recent Review Flowsheet Data    Labs for ITP Cardiac and Pulmonary Rehab Latest Ref Rng & Units 08/28/2016 08/29/2016 08/30/2016 08/31/2016 09/22/2016    Cholestrol 0 - 200 mg/dL - - - - -   LDLCALC 0 - 99 mg/dL - - - - -   LDLDIRECT mg/dL - - - - -   HDL >39.00 mg/dL - - - - -   Trlycerides 0.0 - 149.0 mg/dL - - - - -   Hemoglobin A1c - - - - - 7.2   PHART 7.350 - 7.450 - - - - -   PCO2ART 32.0 - 48.0 mmHg - - - - -   HCO3 20.0 - 28.0 mmol/L - - - - -   TCO2 0 - 100 mmol/L 30 - - - -   ACIDBASEDEF 0.0 - 2.0 mmol/L - - - - -   O2SAT % 51.2 45.3 49.8 50.9 -       Exercise Target Goals: Date: 10/26/16  Exercise Program Goal: Individual exercise prescription set with THRR, safety & activity barriers. Participant demonstrates ability to understand and report RPE using BORG scale, to self-measure pulse accurately, and to acknowledge the importance of the exercise prescription.  Exercise Prescription Goal: Starting with aerobic activity 30 plus minutes a day, 3 days per week for initial exercise prescription. Provide home exercise prescription and guidelines that participant acknowledges understanding prior to discharge.  Activity Barriers & Risk Stratification:     Activity Barriers & Cardiac Risk Stratification - 10/26/16 1115      Activity Barriers & Cardiac Risk Stratification   Activity Barriers Deconditioning;Muscular Weakness;Other (comment)   Comments Hx Right Shoulder Rotator Cuff repair   Cardiac Risk Stratification High      6 Minute Walk:     6 Minute Walk    Row Name 10/26/16 1449         6 Minute Walk   Phase Initial     Distance 1460 feet     Walk Time 6 minutes     # of Rest Breaks 0     MPH 2.77     METS 4.04     RPE 12     VO2 Peak 14.14     Symptoms No     Resting HR 77 bpm     Resting BP 128/74     Max Ex. HR 110 bpm     Max Ex. BP 126/54     2 Minute Post BP 114/56        Initial Exercise Prescription:     Initial Exercise Prescription - 10/26/16 1400      Date of Initial Exercise RX and Referring Provider   Date 10/26/16   Referring Provider Lyman Bishop MD     Treadmill   MPH  2.5   Grade 2   Minutes 15   METs 3.6     REL-XR   Level 3   Watts --  speed >50   Minutes 15   METs 3     T5 Nustep   Level 3   Watts --  80-100 spm   Minutes 15   METs 3     Prescription Details   Frequency (times per week) 3   Duration Progress to 45 minutes of aerobic exercise without signs/symptoms of physical distress     Intensity   THRR 40-80% of Max Heartrate 112-147   Ratings of Perceived Exertion 11-15   Perceived Dyspnea 0-4     Progression   Progression Continue to progress workloads to maintain intensity without signs/symptoms of physical distress.     Resistance Training   Training Prescription Yes   Weight 3 lbs   Reps 10-15      Perform Capillary Blood Glucose checks as needed.  Exercise Prescription Changes:      Exercise Prescription Changes    Row Name 10/26/16 1400             Exercise Review   Progression -  walk test results         Response to Exercise   Blood Pressure (Admit) 128/74       Blood Pressure (Exercise) 126/54       Blood Pressure (Exit) 114/56       Heart Rate (Admit) 77 bpm       Heart Rate (Exercise) 110 bpm       Heart Rate (Exit) 84 bpm       Oxygen Saturation (Admit) 100 %       Oxygen Saturation (Exercise) 99 %       Rating of Perceived Exertion (Exercise) 12       Symptoms none          Exercise Comments:      Exercise Comments    Row Name 10/26/16 1453           Exercise Comments Pilar Plate wants to increase his strength, stamina, and endurance.  He would like to be able to get back to work and life being stable.          Discharge Exercise Prescription (Final Exercise Prescription Changes):     Exercise Prescription Changes - 10/26/16 1400      Exercise Review   Progression --  walk test results     Response to Exercise   Blood Pressure (Admit) 128/74   Blood Pressure (Exercise) 126/54   Blood Pressure (Exit) 114/56   Heart Rate (Admit) 77 bpm   Heart Rate (Exercise) 110 bpm    Heart Rate (Exit) 84 bpm   Oxygen Saturation (Admit) 100 %   Oxygen Saturation (Exercise) 99 %   Rating of Perceived Exertion (Exercise) 12   Symptoms none      Nutrition:  Target Goals: Understanding of nutrition guidelines, daily intake of sodium <1548m, cholesterol <2028m calories 30% from fat and 7% or less from saturated fats, daily to have 5 or more servings of fruits and vegetables.  Biometrics:     Pre Biometrics - 10/26/16 1453      Pre Biometrics   Height 5' 5.5" (1.664 m)   Weight 165 lb 12.8 oz (75.2 kg)   Waist Circumference 35.5 inches   Hip Circumference 37.5 inches   Waist to Hip Ratio 0.95 %   BMI (Calculated) 27.2   Single Leg Stand 1.93 seconds       Nutrition Therapy Plan and Nutrition Goals:     Nutrition Therapy & Goals - 10/26/16 1228  Nutrition Therapy   Drug/Food Interactions Statins/Certain Fruits;Coumadin/Vit K     Intervention Plan   Intervention Prescribe, educate and counsel regarding individualized specific dietary modifications aiming towards targeted core components such as weight, hypertension, lipid management, diabetes, heart failure and other comorbidities.   Expected Outcomes Short Term Goal: Understand basic principles of dietary content, such as calories, fat, sodium, cholesterol and nutrients.;Short Term Goal: A plan has been developed with personal nutrition goals set during dietitian appointment.;Long Term Goal: Adherence to prescribed nutrition plan.      Nutrition Discharge: Rate Your Plate Scores:     Nutrition Assessments - 10/26/16 1342      MEDFICTS Scores   Pre Score 78      Nutrition Goals Re-Evaluation:   Psychosocial: Target Goals: Acknowledge presence or absence of depression, maximize coping skills, provide positive support system. Participant is able to verbalize types and ability to use techniques and skills needed for reducing stress and depression.  Initial Review & Psychosocial Screening:      Initial Psych Review & Screening - 10/26/16 1232      Initial Review   Current issues with Current Depression;Current Stress Concerns     Family Dynamics   Good Support System? Yes   Comments Demir is separated from his wife but his daughter was here today and was very supportive. Damarien said he is a [place] but lately has been depressed and usually sees the positive in life. Farooq's daughter emphasized and he added that this all started wtih a infected hair follicle while he was away for the weekend at the beach with his wife. The hair follicle infection grew and MD told him had to have surgery in the hospital-then due to his diabetes he had a heart attack. Delevlped MRSA infection then needed open heart surgery.       Quality of Life Scores:     Quality of Life - 10/26/16 1235      Quality of Life Scores   Health/Function Pre 18.13 %   Socioeconomic Pre 19.86 %   Psych/Spiritual Pre 15.43 %   Family Pre 19.4 %   GLOBAL Pre 18.12 %      PHQ-9: Recent Review Flowsheet Data    Depression screen [facility] 2/9 10/26/2016   Decreased Interest 2   Down, Depressed, Hopeless 2   PHQ - 2 Score 4   Altered sleeping 1   Tired, decreased energy 1   Change in appetite 0   Feeling bad or failure about yourself  2   Trouble concentrating 0   Moving slowly or fidgety/restless 0   Suicidal thoughts 0   PHQ-9 Score 8   Difficult doing work/chores Somewhat difficult      Psychosocial Evaluation and Intervention:   Psychosocial Re-Evaluation:   Vocational Rehabilitation: Provide vocational rehab assistance to qualifying candidates.   Vocational Rehab Evaluation & Intervention:     Vocational Rehab - 10/26/16 1114      Initial Vocational Rehab Evaluation & Intervention   Assessment shows need for Vocational Rehabilitation No      Education: Education Goals: Education classes will be provided on a weekly basis, covering required topics. Participant will state understanding/return  demonstration of topics presented.  Learning Barriers/Preferences:     Learning Barriers/Preferences - 10/26/16 1115      Learning Barriers/Preferences   Learning Barriers None   Learning Preferences Individual Instruction      Education Topics: General Nutrition Guidelines/Fats and Fiber: -Group instruction provided by verbal, written material, models and  posters to present the general guidelines for heart healthy nutrition. Gives an explanation and review of dietary fats and fiber.   Controlling Sodium/Reading Food Labels: -Group verbal and written material supporting the discussion of sodium use in heart healthy nutrition. Review and explanation with models, verbal and written materials for utilization of the food label.   Exercise Physiology & Risk Factors: - Group verbal and written instruction with models to review the exercise physiology of the cardiovascular system and associated critical values. Details cardiovascular disease risk factors and the goals associated with each risk factor.   Aerobic Exercise & Resistance Training: - Gives group verbal and written discussion on the health impact of inactivity. On the components of aerobic and resistive training programs and the benefits of this training and how to safely progress through these programs.   Flexibility, Balance, General Exercise Guidelines: - Provides group verbal and written instruction on the benefits of flexibility and balance training programs. Provides general exercise guidelines with specific guidelines to those with heart or lung disease. Demonstration and skill practice provided.   Stress Management: - Provides group verbal and written instruction about the health risks of elevated stress, cause of high stress, and healthy ways to reduce stress.   Depression: - Provides group verbal and written instruction on the correlation between heart/lung disease and depressed mood, treatment options, and the  stigmas associated with seeking treatment.   Anatomy & Physiology of the Heart: - Group verbal and written instruction and models provide basic cardiac anatomy and physiology, with the coronary electrical and arterial systems. Review of: AMI, Angina, Valve disease, Heart Failure, Cardiac Arrhythmia, Pacemakers, and the ICD.   Cardiac Procedures: - Group verbal and written instruction and models to describe the testing methods done to diagnose heart disease. Reviews the outcomes of the test results. Describes the treatment choices: Medical Management, Angioplasty, or Coronary Bypass Surgery.   Cardiac Medications: - Group verbal and written instruction to review commonly prescribed medications for heart disease. Reviews the medication, class of the drug, and side effects. Includes the steps to properly store meds and maintain the prescription regimen.   Go Sex-Intimacy & Heart Disease, Get SMART - Goal Setting: - Group verbal and written instruction through game format to discuss heart disease and the return to sexual intimacy. Provides group verbal and written material to discuss and apply goal setting through the application of the S.M.A.R.T. Method.   Other Matters of the Heart: - Provides group verbal, written materials and models to describe Heart Failure, Angina, Valve Disease, and Diabetes in the realm of heart disease. Includes description of the disease process and treatment options available to the cardiac patient.   Exercise & Equipment Safety: - Individual verbal instruction and demonstration of equipment use and safety with use of the equipment. Flowsheet Row Cardiac Rehab from 10/26/2016 in [facility] Cardiac and Pulmonary Rehab  Date  10/26/16  Educator  C. EnterkinRN  Instruction Review Code  1- partially meets, needs review/practice      Infection Prevention: - Provides verbal and written material to individual with discussion of infection control including proper hand  washing and proper equipment cleaning during exercise session. Flowsheet Row Cardiac Rehab from 10/26/2016 in [facility] Cardiac and Pulmonary Rehab  Date  10/26/16  Educator  C. EnterkinRN  Instruction Review Code  2- meets goals/outcomes      Falls Prevention: - Provides verbal and written material to individual with discussion of falls prevention and safety. Flowsheet Row Cardiac Rehab from 10/26/2016 in [facility]  Cardiac and Pulmonary Rehab  Date  10/26/16  Educator  C. [place]  Instruction Review Code  2- meets goals/outcomes      Diabetes: - Individual verbal and written instruction to review signs/symptoms of diabetes, desired ranges of glucose level fasting, after meals and with exercise. Advice that pre and post exercise glucose checks will be done for 3 sessions at entry of program. Flowsheet Row Cardiac Rehab from 10/26/2016 in [facility] Cardiac and Pulmonary Rehab  Date  10/26/16  Educator  C. [place]  Instruction Review Code  1- partially meets, needs review/practice       Knowledge Questionnaire Score:     Knowledge Questionnaire Score - 10/26/16 1114      Knowledge Questionnaire Score   Pre Score 23      Core Components/Risk Factors/Patient Goals at Admission:     Personal Goals and Risk Factors at Admission - 10/26/16 1234      Core Components/Risk Factors/Patient Goals on Admission    Weight Management Yes;Weight Maintenance   Intervention Weight Management: Develop a combined nutrition and exercise program designed to reach desired caloric intake, while maintaining appropriate intake of nutrient and fiber, sodium and fats, and appropriate energy expenditure required for the weight goal.;Weight Management: Provide education and appropriate resources to help participant work on and attain dietary goals.   Admit Weight 165 lb 12.8 oz (75.2 kg)   Goal Weight: Short Term 165 lb (74.8 kg)   Goal Weight: Long Term 162 lb (73.5 kg)   Expected Outcomes Long Term: Adherence to  nutrition and physical activity/exercise program aimed toward attainment of established weight goal;Short Term: Continue to assess and modify interventions until short term weight is achieved;Weight Maintenance: Understanding of the daily nutrition guidelines, which includes 25-35% calories from fat, 7% or less cal from saturated fats, less than 234m cholesterol, less than 1.5gm of sodium, & 5 or more servings of fruits and vegetables daily   Increase Strength and Stamina Yes   Intervention Provide advice, education, support and counseling about physical activity/exercise needs.;Develop an individualized exercise prescription for aerobic and resistive training based on initial evaluation findings, risk stratification, comorbidities and participant's personal goals.   Expected Outcomes Achievement of increased cardiorespiratory fitness and enhanced flexibility, muscular endurance and strength shown through measurements of functional capacity and personal statement of participant.   Tobacco Cessation Yes   Intervention Assist the participant in steps to quit. Provide individualized education and counseling about committing to Tobacco Cessation, relapse prevention, and pharmacological support that can be provided by physician.;OAdvice worker assist with locating and accessing local/national Quit Smoking programs, and support quit date choice.   Expected Outcomes Long Term: Complete abstinence from all tobacco products for at least 12 months from quit date.   Diabetes Yes   Intervention Provide education about signs/symptoms and action to take for hypo/hyperglycemia.;Provide education about proper nutrition, including hydration, and aerobic/resistive exercise prescription along with prescribed medications to achieve blood glucose in normal ranges: Fasting glucose 65-99 mg/dL   Expected Outcomes Short Term: Participant verbalizes understanding of the signs/symptoms and immediate care of  hyper/hypoglycemia, proper foot care and importance of medication, aerobic/resistive exercise and nutrition plan for blood glucose control.;Long Term: Attainment of HbA1C < 7%.   Hypertension Yes   Intervention Provide education on lifestyle modifcations including regular physical activity/exercise, weight management, moderate sodium restriction and increased consumption of fresh fruit, vegetables, and low fat dairy, alcohol moderation, and smoking cessation.;Monitor prescription use compliance.   Expected Outcomes Short Term: Continued assessment and  intervention until BP is < 140/61m HG in hypertensive participants. < 130/826mHG in hypertensive participants with diabetes, heart failure or chronic kidney disease.;Long Term: Maintenance of blood pressure at goal levels.   Lipids Yes   Intervention Provide education and support for participant on nutrition & aerobic/resistive exercise along with prescribed medications to achieve LDL <7072mHDL >13m1m Expected Outcomes Short Term: Participant states understanding of desired cholesterol values and is compliant with medications prescribed. Participant is following exercise prescription and nutrition guidelines.;Long Term: Cholesterol controlled with medications as prescribed, with individualized exercise RX and with personalized nutrition plan. Value goals: LDL < 70mg21mL > 40 mg.   Stress Yes   Intervention Offer individual and/or small group education and counseling on adjustment to heart disease, stress management and health-related lifestyle change. Teach and support self-help strategies.;Refer participants experiencing significant psychosocial distress to appropriate mental health specialists for further evaluation and treatment. When possible, include family members and significant others in education/counseling sessions.   Expected Outcomes Short Term: Participant demonstrates changes in health-related behavior, relaxation and other stress  management skills, ability to obtain effective social support, and compliance with psychotropic medications if prescribed.;Long Term: Emotional wellbeing is indicated by absence of clinically significant psychosocial distress or social isolation.   Personal Goal Other Yes   Personal Goal --  JamesScotteparated from his wife but his daughter was here today and was very supportive. JamesMerick he is a [place] has been depressed and usually sees the positive in life. Deondra's daughter emphasized and he added that this all started wti   Intervention [place] meet with Mental Health Counselor on staff.   Expected Outcomes To discuss with his doctor his limitations after his CABG 09/02/2016 but hopefully be able to get his stamina back for work.       Core Components/Risk Factors/Patient Goals Review:    Core Components/Risk Factors/Patient Goals at Discharge (Final Review):    ITP Comments:     ITP Comments    Row Name 10/26/16 1227           ITP Comments ITP created during Medical review today. Documentation of diagnosis [facility] Admission 08/24/2016 CABG x3          Comments: JamesToviaeparated from his wife but his daughter was here today and was very supportive. JamesBrier he is a [place] has been depressed and usually sees the positive in life. Alvy's daughter emphasized and he added that this all started wtih a infected hair follicle while he was away for the weekend at the beach with his wife. The hair follicle infection grew and MD told him had to have surgery in the hospital-then due to his diabetes he had a heart attack. Delevlped MRSA infection then needed open heart surgery.

## 2016-10-28 ENCOUNTER — Encounter: Payer: BLUE CROSS/BLUE SHIELD | Admitting: *Deleted

## 2016-10-28 DIAGNOSIS — I2510 Atherosclerotic heart disease of native coronary artery without angina pectoris: Secondary | ICD-10-CM | POA: Diagnosis not present

## 2016-10-28 DIAGNOSIS — Z951 Presence of aortocoronary bypass graft: Secondary | ICD-10-CM

## 2016-10-28 LAB — GLUCOSE, CAPILLARY
GLUCOSE-CAPILLARY: 154 mg/dL — AB (ref 65–99)
GLUCOSE-CAPILLARY: 108 mg/dL — AB (ref 65–99)

## 2016-10-28 NOTE — Progress Notes (Signed)
Daily Session Note  Patient Details  Name: PAU BANH MRN: [id number] Date of Birth: 1961-07-03 Referring Provider:   Flowsheet Row Cardiac Rehab from 10/26/2016 in [facility]  Referring Provider  Lyman Bishop MD      Encounter Date: 10/28/2016  Check In:     Session Check In - 10/28/16 1645      Check-In   Location [facility]-Cardiac & Pulmonary Rehab   Staff Present Other   Staff Present Gerlene Burdock, RN, Vickki Hearing, BA, ACSM CEP, Exercise Physiologist;Patricia Surles RN BSN   Supervising physician immediately available to respond to emergencies See telemetry face sheet for immediately available ER MD   Medication changes reported     No   Fall or balance concerns reported    No   Warm-up and Cool-down Performed on first and last piece of equipment   Resistance Training Performed Yes   VAD Patient? No     Pain Assessment   Currently in Pain? No/denies         Goals Met:  Proper associated with RPD/PD & O2 Sat Exercise tolerated well No report of cardiac concerns or symptoms  Goals Unmet:  Not Applicable  Comments:     Dr. Emily Filbert is Medical Director for [place] and [facility].

## 2016-10-29 ENCOUNTER — Encounter: Payer: BLUE CROSS/BLUE SHIELD | Admitting: *Deleted

## 2016-10-29 DIAGNOSIS — I2510 Atherosclerotic heart disease of native coronary artery without angina pectoris: Secondary | ICD-10-CM | POA: Diagnosis not present

## 2016-10-29 DIAGNOSIS — Z951 Presence of aortocoronary bypass graft: Secondary | ICD-10-CM

## 2016-10-29 LAB — GLUCOSE, CAPILLARY
GLUCOSE-CAPILLARY: 139 mg/dL — AB (ref 65–99)
Glucose-Capillary: 248 mg/dL — ABNORMAL HIGH (ref 65–99)

## 2016-10-29 NOTE — Progress Notes (Signed)
Daily Session Note  Patient Details  Name: [name] MRN: [id number] Date of Birth: [birth year] Referring Provider:   Flowsheet Row Cardiac Rehab from 10/26/2016 in [facility] Cardiac and Pulmonary Rehab  Referring Provider  Lyman Bishop MD      Encounter Date: 10/29/2016  Check In:     Session Check In - 10/29/16 1634      Check-In   Location [facility]   Staff Present Gerlene Burdock, RN, BSN;Patricia Surles RN Moises Blood, BS, ACSM CEP, Exercise Physiologist   Supervising physician immediately available to respond to emergencies See telemetry face sheet for immediately available ER MD   Medication changes reported     No   Fall or balance concerns reported    No   Warm-up and Cool-down Performed on first and last piece of equipment   Resistance Training Performed Yes   VAD Patient? No     Pain Assessment   Currently in Pain? No/denies           Exercise Prescription Changes - 10/29/16 1100      Response to Exercise   Blood Pressure (Admit) 114/68   Blood Pressure (Exercise) 124/68   Blood Pressure (Exit) 122/78   Heart Rate (Admit) 79 bpm   Heart Rate (Exercise) 94 bpm   Heart Rate (Exit) 83 bpm   Rating of Perceived Exertion (Exercise) 13   Symptoms none   Duration Progress to 45 minutes of aerobic exercise without signs/symptoms of physical distress   Intensity THRR unchanged     Progression   Progression Continue to progress workloads to maintain intensity without signs/symptoms of physical distress.   Average METs 2.75     Resistance Training   Training Prescription Yes   Weight 3   Reps 10-15     Interval Training   Interval Training No     REL-XR   Level 3   Minutes 15   METs 3.3     T5 Nustep   Level 3   Minutes 15   METs 2.2      Goals Met:  Proper associated with RPD/PD & O2 Sat Exercise tolerated well  Goals Unmet:  Not Applicable  Comments:     Dr. Emily Filbert is Medical Director for [place] and [facility].

## 2016-11-02 ENCOUNTER — Encounter: Payer: BLUE CROSS/BLUE SHIELD | Admitting: *Deleted

## 2016-11-02 DIAGNOSIS — I2510 Atherosclerotic heart disease of native coronary artery without angina pectoris: Secondary | ICD-10-CM | POA: Diagnosis not present

## 2016-11-02 DIAGNOSIS — Z951 Presence of aortocoronary bypass graft: Secondary | ICD-10-CM

## 2016-11-02 LAB — GLUCOSE, CAPILLARY
GLUCOSE-CAPILLARY: 126 mg/dL — AB (ref 65–99)
Glucose-Capillary: 244 mg/dL — ABNORMAL HIGH (ref 65–99)

## 2016-11-02 NOTE — Progress Notes (Signed)
Daily Session Note  Patient Details  Name: [name] MRN: [id number] Date of Birth: May 14, 1961 Referring Provider:   Flowsheet Row Cardiac Rehab from 10/26/2016 in [facility] Cardiac and Pulmonary Rehab  Referring Provider  Lyman Bishop MD      Encounter Date: 11/02/2016  Check In:     Session Check In - 11/02/16 1618      Check-In   Location [facility]   Staff Present Gerlene Burdock, RN, BSN;Susanne Bice, RN, BSN, Laveda Norman, BS, ACSM CEP, Exercise Physiologist   Supervising physician immediately available to respond to emergencies See telemetry face sheet for immediately available ER MD   Medication changes reported     No   Fall or balance concerns reported    No   Warm-up and Cool-down Performed on first and last piece of equipment   Resistance Training Performed Yes   VAD Patient? No     Pain Assessment   Currently in Pain? No/denies   Multiple Pain Sites No         Goals Met:  Independence with exercise equipment Exercise tolerated well No report of cardiac concerns or symptoms Strength training completed today  Goals Unmet:  Not Applicable  Comments: Pt able to follow exercise prescription today without complaint.  Will continue to monitor for progression.    Dr. Emily Filbert is Medical Director for [place] and [facility].

## 2016-11-03 ENCOUNTER — Telehealth (HOSPITAL_COMMUNITY): Payer: Self-pay | Admitting: Cardiac Rehabilitation

## 2016-11-03 NOTE — Telephone Encounter (Signed)
pc to pt to discuss enrolling in cardiac rehab. Pt declined, he is exercising at [facility].

## 2016-11-06 ENCOUNTER — Other Ambulatory Visit: Payer: Self-pay | Admitting: Internal Medicine

## 2016-11-06 MED ORDER — AMIODARONE HCL 200 MG PO TABS: 200.0000 mg | ORAL_TABLET | Freq: Every day | ORAL | 2 refills | Status: DC

## 2016-11-11 ENCOUNTER — Encounter: Payer: BLUE CROSS/BLUE SHIELD | Admitting: *Deleted

## 2016-11-11 DIAGNOSIS — I2510 Atherosclerotic heart disease of native coronary artery without angina pectoris: Secondary | ICD-10-CM | POA: Diagnosis not present

## 2016-11-11 DIAGNOSIS — Z951 Presence of aortocoronary bypass graft: Secondary | ICD-10-CM

## 2016-11-11 NOTE — Progress Notes (Signed)
Daily Session Note  Patient Details  Name: [name] MRN: [id number] Date of Birth: [birth year] Referring Provider:   Flowsheet Row Cardiac Rehab from 10/26/2016 in [facility] Cardiac and Pulmonary Rehab  Referring Provider  Lyman Bishop MD      Encounter Date: 11/11/2016  Check In:     Session Check In - 11/11/16 1716      Check-In   Location [facility]   Staff Present Gerlene Burdock, RN, Vickki Hearing, BA, ACSM CEP, Exercise Physiologist;Patricia Surles RN BSN   Supervising physician immediately available to respond to emergencies See telemetry face sheet for immediately available ER MD   Medication changes reported     No   Fall or balance concerns reported    No   Warm-up and Cool-down Performed on first and last piece of equipment   Resistance Training Performed Yes   VAD Patient? No     Pain Assessment   Currently in Pain? No/denies         Goals Met:  Proper associated with RPD/PD & O2 Sat Exercise tolerated well  Goals Unmet:  Not Applicable  Comments:     Dr. Emily Filbert is Medical Director for [place] and [facility].

## 2016-11-13 ENCOUNTER — Ambulatory Visit (INDEPENDENT_AMBULATORY_CARE_PROVIDER_SITE_OTHER): Payer: BLUE CROSS/BLUE SHIELD | Admitting: Pharmacist Clinician (PhC)/ Clinical Pharmacy Specialist

## 2016-11-13 DIAGNOSIS — Z5181 Encounter for therapeutic drug level monitoring: Secondary | ICD-10-CM

## 2016-11-13 DIAGNOSIS — I4891 Unspecified atrial fibrillation: Secondary | ICD-10-CM

## 2016-11-13 LAB — POCT INR: INR: 2

## 2016-11-17 ENCOUNTER — Ambulatory Visit (INDEPENDENT_AMBULATORY_CARE_PROVIDER_SITE_OTHER): Payer: BLUE CROSS/BLUE SHIELD | Admitting: Adult Health

## 2016-11-17 ENCOUNTER — Encounter: Payer: Self-pay | Admitting: Adult Health

## 2016-11-17 VITALS — BP 120/70 | Temp 98.3°F | Wt 170.8 lb

## 2016-11-17 DIAGNOSIS — F339 Major depressive disorder, recurrent, unspecified: Secondary | ICD-10-CM

## 2016-11-17 DIAGNOSIS — IMO0001 Reserved for inherently not codable concepts without codable children: Secondary | ICD-10-CM

## 2016-11-17 DIAGNOSIS — R251 Tremor, unspecified: Secondary | ICD-10-CM

## 2016-11-17 DIAGNOSIS — E1165 Type 2 diabetes mellitus with hyperglycemia: Secondary | ICD-10-CM

## 2016-11-17 LAB — POCT GLYCOSYLATED HEMOGLOBIN (HGB A1C): Hemoglobin A1C: 8.6

## 2016-11-17 MED ORDER — BUPROPION HCL ER (XL) 150 MG PO TB24: 150.0000 mg | ORAL_TABLET | Freq: Every day | ORAL | 3 refills | Status: DC

## 2016-11-17 NOTE — Progress Notes (Signed)
Subjective:    Patient ID: [name], male    DOB: [birth year], 56 y.o.   MRN: [id number]  HPI  56 year old male who  has a past medical history of Abrasion (L FOREARM); Atrial fibrillation ([place]); CAD (coronary artery disease); Cancer ([place]); Diabetes mellitus without complication ([place]); Headache(784.0); Hematuria, microscopic ("SINCE I WAS A KID"); Hyperlipidemia; Hypertension; Ischemic cardiomyopathy; Lactose intolerance; Myocardial infarction; Respiratory failure ([place]); Rotator cuff tear (RIGHT); and Tobacco use.  He presents to the office today for one month follow up regarding Depression. During the last visit his Wellbutrin was increased from 150 mg to 373m. He is going to therapy and is exercising which he endorses helps with his depression. He continues to have tremors and would like to come off Wellbutrin as he believes this is what is causing his tremors. He does not want to go on any other medications at this time.   He also reports that his blood sugars have been controlled at home but that he has noticed some highs as of recently. He continues to exercise and is eating well. He has put on 5 pounds since his last visit.   His last A1c was 7.2    Review of Systems  Constitutional: Negative.   Respiratory: Negative.   Cardiovascular: Negative.   Gastrointestinal: Negative.   Genitourinary: Negative.   Neurological: Positive for tremors.  Psychiatric/Behavioral:       Depression    All other systems reviewed and are negative.  Past Medical History:  Diagnosis Date  . Abrasion L FOREARM  . Atrial fibrillation ([place]    a. initially occurring in post-op setting in 05/2016, started on Eliquis b. 08/2016: s/p clipping of atrial appendage at time of CABG --> Eliquis switched to Coumadin  . CAD (coronary artery disease)    a. 05/2016: NSTEMI, cath showing 3v disease with CABG recommended once MRSA infection resolved. b. 08/2016: CABG with LIMA-LAD, SVG-D1, and SVG-RI.  .[place] Cancer [place]    basal cell carcinoma of the skin  . Diabetes mellitus without complication ([place]   . Headache(784.0)   . Hematuria, microscopic "SINCE I WAS A KID"  . Hyperlipidemia   . Hypertension   . Ischemic cardiomyopathy    a. 05/2016: echo w/ EF of 25-35% and akinesis of the basal-midinferolateral and inferior myocardium.  . Lactose intolerance   . Myocardial infarction   . Respiratory failure ([place]   . Rotator cuff tear RIGHT  . Tobacco use    a. quit in 05/2016 following MI    Social History   Social History  . Marital status: Married    Spouse name: N/A  . Number of children: N/A  . Years of education: N/A   Occupational History  . Not on file.   Social History Main Topics  . Smoking status: Former Smoker    Packs/day: 1.50  . Smokeless tobacco: Never Used     Comment: last use 07/2016  . Alcohol use No  . Drug use: No  . Sexual activity: Yes   Other Topics Concern  . Not on file   Social History Narrative  . No narrative on file    Past Surgical History:  Procedure Laterality Date  . CARDIAC CATHETERIZATION N/A 06/16/2016   Procedure: Left Heart Cath and Coronary Angiography;  Surgeon: TTroy Sine MD;  [facility];  Service: Cardiovascular;  Laterality: N/A;  . CLIPPING OF ATRIAL APPENDAGE N/A 08/24/2016   Procedure: CLIPPING OF ATRIAL APPENDAGE;  Surgeon: Ivin Poot, MD;  Location: [place];  Service: Open Heart Surgery;  Laterality: N/A;  . CORONARY ARTERY BYPASS GRAFT N/A 08/24/2016   Procedure: CORONARY ARTERY BYPASS GRAFTING (CABG) x three,  using left internal mammary artery and right leg greater saphenous vein harvested endoscopically;  Surgeon: Ivin Poot, MD;  Location: [place];  Service: Open Heart Surgery;  Laterality: N/A;  . HERNIA REPAIR  X2  . INCISION AND DRAINAGE ABSCESS Left 06/10/2016   Procedure: INCISION AND DRAINAGE LEFT AXILLARY ABSCESS;  Surgeon: Autumn Messing III, MD;  Location: [facility];  Service: General;   Laterality: Left;  . ROTATOR CUFF REPAIR Bilateral   . TEE WITHOUT CARDIOVERSION N/A 08/24/2016   Procedure: TRANSESOPHAGEAL ECHOCARDIOGRAM (TEE);  Surgeon: Ivin Poot, MD;  Location: [place];  Service: Open Heart Surgery;  Laterality: N/A;    Family History  Problem Relation Age of Onset  . Hypertension Father   . Sudden death Father   . Diabetes Father   . Hypertension Mother   . Heart attack Sister     Allergies  Allergen Reactions  . Latex Rash    Current Outpatient Prescriptions on File Prior to Visit  Medication Sig Dispense Refill  . amiodarone (PACERONE) 200 MG tablet Take 1 tablet (200 mg total) by mouth daily. 30 tablet 2  . aspirin 81 MG chewable tablet Chew 1 tablet (81 mg total) by mouth daily. 30 tablet 3  . atorvastatin (LIPITOR) 80 MG tablet Take 1 tablet (80 mg total) by mouth at bedtime. 90 tablet 3  . blood glucose meter kit and supplies KIT Dispense based on patient and insurance preference. Use up to four times daily as directed. (FOR ICD-9 250.00, 250.01). 1 each 0  . buPROPion (WELLBUTRIN XL) 300 MG 24 hr tablet Take 1 tablet (300 mg total) by mouth daily. 90 tablet 1  . glipiZIDE (GLUCOTROL) 10 MG tablet Take 1 tablet (10 mg total) by mouth 2 (two) times daily before a meal. 180 tablet 1  . hydrOXYzine (ATARAX/VISTARIL) 10 MG tablet Take 1 tablet (10 mg total) by mouth 3 (three) times daily as needed. 30 tablet 0  . metFORMIN (GLUCOPHAGE) 1000 MG tablet Take 1 tablet (1,000 mg total) by mouth 2 (two) times daily with a meal. 180 tablet 3  . metoprolol tartrate (LOPRESSOR) 25 MG tablet Take 1 tablet (25 mg total) by mouth 2 (two) times daily. 60 tablet 11  . ONE TOUCH ULTRA TEST test strip USE UP TO 4 TIMES DAILY AS DIRECTED  2  . ONETOUCH DELICA LANCETS 10G MISC USE UP TO 4 TIMES DAILY AS DIRECTED  2  . warfarin (COUMADIN) 5 MG tablet Take 1 tablet (5 mg total) by mouth daily at 6 PM. As prescribed by coumadin clinic 100 tablet 1   No current  facility-administered medications on file prior to visit.     BP 120/70 (BP Location: Left Arm, Patient Position: Sitting, Cuff Size: Normal)   Temp 98.3 F (36.8 C) (Oral)   Wt 170 lb 12.8 oz (77.5 kg)   BMI 27.99 kg/m       Objective:   Physical Exam  Constitutional: He is oriented to person, place, and time. He appears well-developed and well-nourished. No distress.  Cardiovascular: Normal rate, regular rhythm, normal heart sounds and intact distal pulses.  Exam reveals no gallop and no friction rub.   No murmur heard. Pulmonary/Chest: Effort normal and breath sounds normal. No respiratory distress. He has no wheezes. He  has no rales. He exhibits no tenderness.  Neurological: He is alert and oriented to person, place, and time. He has normal reflexes. No cranial nerve deficit. He exhibits normal muscle tone. Coordination normal.  Upper extremity tremors   Skin: Skin is warm and dry. No rash noted. No erythema. No pallor.  Psychiatric: He has a normal mood and affect. His behavior is normal. Judgment and thought content normal.  Nursing note and vitals reviewed.     Assessment & Plan:  1. Uncontrolled type 2 diabetes mellitus without complication, without long-term current use of insulin (HCC)  - POC HgB A1c- 8.6  - Work on diet and exercise - Will not change medication at this time - Follow up with in March   2. Tremor - Possibly related to Wellbutrin.  - buPROPion (WELLBUTRIN XL) 150 MG 24 hr tablet; Take 1 tablet (150 mg total) by mouth daily.  Dispense: 30 tablet; Refill: 3 - Will start tapering off. Instructions for this was given to patient.  - Follow up as needed  3. Depression, recurrent (HCC) - buPROPion (WELLBUTRIN XL) 150 MG 24 hr tablet; Take 1 tablet (150 mg total) by mouth daily.  Dispense: 30 tablet; Refill: 3 - Continue with therapy  - Follow up as needed  [employer]

## 2016-11-18 ENCOUNTER — Encounter: Payer: Self-pay | Admitting: *Deleted

## 2016-11-18 ENCOUNTER — Encounter: Payer: Self-pay | Admitting: Cardiothoracic Surgery

## 2016-11-18 ENCOUNTER — Ambulatory Visit (INDEPENDENT_AMBULATORY_CARE_PROVIDER_SITE_OTHER): Payer: Self-pay | Admitting: Cardiothoracic Surgery

## 2016-11-18 VITALS — BP 122/83 | HR 78 | Resp 20 | Ht 66.0 in | Wt 170.0 lb

## 2016-11-18 DIAGNOSIS — I2510 Atherosclerotic heart disease of native coronary artery without angina pectoris: Secondary | ICD-10-CM

## 2016-11-18 DIAGNOSIS — Z951 Presence of aortocoronary bypass graft: Secondary | ICD-10-CM

## 2016-11-18 DIAGNOSIS — E1159 Type 2 diabetes mellitus with other circulatory complications: Secondary | ICD-10-CM

## 2016-11-18 NOTE — Progress Notes (Signed)
Cardiac Individual Treatment Plan  Patient Details  Name: [name] MRN: [id number] Date of Birth: Sep 18, 1961 Referring Provider:   Flowsheet Row Cardiac Rehab from 10/26/2016 in [facility] Cardiac and Pulmonary Rehab  Referring Provider  Lyman Bishop MD      Initial Encounter Date:  Flowsheet Row Cardiac Rehab from 10/26/2016 in [facility] Cardiac and Pulmonary Rehab  Date  10/26/16  Referring Provider  Lyman Bishop MD      Visit Diagnosis: S/P CABG x 3  Patient's Home Medications on Admission:  Current Outpatient Prescriptions:  .  amiodarone (PACERONE) 200 MG tablet, Take 1 tablet (200 mg total) by mouth daily., Disp: 30 tablet, Rfl: 2 .  aspirin 81 MG chewable tablet, Chew 1 tablet (81 mg total) by mouth daily., Disp: 30 tablet, Rfl: 3 .  atorvastatin (LIPITOR) 80 MG tablet, Take 1 tablet (80 mg total) by mouth at bedtime., Disp: 90 tablet, Rfl: 3 .  blood glucose meter kit and supplies KIT, Dispense based on patient and insurance preference. Use up to four times daily as directed. (FOR ICD-9 250.00, 250.01)., Disp: 1 each, Rfl: 0 .  buPROPion (WELLBUTRIN XL) 150 MG 24 hr tablet, Take 1 tablet (150 mg total) by mouth daily., Disp: 30 tablet, Rfl: 3 .  glipiZIDE (GLUCOTROL) 10 MG tablet, Take 1 tablet (10 mg total) by mouth 2 (two) times daily before a meal., Disp: 180 tablet, Rfl: 1 .  hydrOXYzine (ATARAX/VISTARIL) 10 MG tablet, Take 1 tablet (10 mg total) by mouth 3 (three) times daily as needed., Disp: 30 tablet, Rfl: 0 .  metFORMIN (GLUCOPHAGE) 1000 MG tablet, Take 1 tablet (1,000 mg total) by mouth 2 (two) times daily with a meal., Disp: 180 tablet, Rfl: 3 .  metoprolol tartrate (LOPRESSOR) 25 MG tablet, Take 1 tablet (25 mg total) by mouth 2 (two) times daily., Disp: 60 tablet, Rfl: 11 .  ONE TOUCH ULTRA TEST test strip, USE UP TO 4 TIMES DAILY AS DIRECTED, Disp: , Rfl: 2 .  ONETOUCH DELICA LANCETS 63W MISC, USE UP TO 4 TIMES DAILY AS DIRECTED, Disp: , Rfl: 2 .  warfarin (COUMADIN)  5 MG tablet, Take 1 tablet (5 mg total) by mouth daily at 6 PM. As prescribed by coumadin clinic, Disp: 100 tablet, Rfl: 1  Past Medical History: Past Medical History:  Diagnosis Date  . Abrasion L FOREARM  . Atrial fibrillation ([place])    a. initially occurring in post-op setting in 05/2016, started on Eliquis b. 08/2016: s/p clipping of atrial appendage at time of CABG --> Eliquis switched to Coumadin  . CAD (coronary artery disease)    a. 05/2016: NSTEMI, cath showing 3v disease with CABG recommended once MRSA infection resolved. b. 08/2016: CABG with LIMA-LAD, SVG-D1, and SVG-RI.  Marland Kitchen Cancer ([place])    basal cell carcinoma of the skin  . Diabetes mellitus without complication ([place])   . Headache(784.0)   . Hematuria, microscopic "SINCE I WAS A KID"  . Hyperlipidemia   . Hypertension   . Ischemic cardiomyopathy    a. 05/2016: echo w/ EF of 25-35% and akinesis of the basal-midinferolateral and inferior myocardium.  . Lactose intolerance   . Myocardial infarction   . Respiratory failure ([place])   . Rotator cuff tear RIGHT  . Tobacco use    a. quit in 05/2016 following MI    Tobacco Use: History  Smoking Status  . Former Smoker  . Packs/day: 1.50  Smokeless Tobacco  . Never Used    Comment: last use  07/2016    Labs: Recent Review Flowsheet Data    Labs for ITP Cardiac and Pulmonary Rehab Latest Ref Rng & Units 08/29/2016 08/30/2016 08/31/2016 09/22/2016 11/17/2016   Cholestrol 0 - 200 mg/dL - - - - -   LDLCALC 0 - 99 mg/dL - - - - -   LDLDIRECT mg/dL - - - - -   HDL >39.00 mg/dL - - - - -   Trlycerides 0.0 - 149.0 mg/dL - - - - -   Hemoglobin A1c - - - - 7.2 8.6   PHART 7.350 - 7.450 - - - - -   PCO2ART 32.0 - 48.0 mmHg - - - - -   HCO3 20.0 - 28.0 mmol/L - - - - -   TCO2 0 - 100 mmol/L - - - - -   ACIDBASEDEF 0.0 - 2.0 mmol/L - - - - -   O2SAT % 45.3 49.8 50.9 - -       Exercise Target Goals:    Exercise Program Goal: Individual exercise prescription set with THRR,  safety & activity barriers. Participant demonstrates ability to understand and report RPE using BORG scale, to self-measure pulse accurately, and to acknowledge the importance of the exercise prescription.  Exercise Prescription Goal: Starting with aerobic activity 30 plus minutes a day, 3 days per week for initial exercise prescription. Provide home exercise prescription and guidelines that participant acknowledges understanding prior to discharge.  Activity Barriers & Risk Stratification:     Activity Barriers & Cardiac Risk Stratification - 10/26/16 1115      Activity Barriers & Cardiac Risk Stratification   Activity Barriers Deconditioning;Muscular Weakness;Other (comment)   Comments Hx Right Shoulder Rotator Cuff repair   Cardiac Risk Stratification High      6 Minute Walk:     6 Minute Walk    Row Name 10/26/16 1449         6 Minute Walk   Phase Initial     Distance 1460 feet     Walk Time 6 minutes     # of Rest Breaks 0     MPH 2.77     METS 4.04     RPE 12     VO2 Peak 14.14     Symptoms No     Resting HR 77 bpm     Resting BP 128/74     Max Ex. HR 110 bpm     Max Ex. BP 126/54     2 Minute Post BP 114/56        Initial Exercise Prescription:     Initial Exercise Prescription - 10/26/16 1400      Date of Initial Exercise RX and Referring Provider   Date 10/26/16   Referring Provider Lyman Bishop MD     Treadmill   MPH 2.5   Grade 2   Minutes 15   METs 3.6     REL-XR   Level 3   Watts --  speed >50   Minutes 15   METs 3     T5 Nustep   Level 3   Watts --  80-100 spm   Minutes 15   METs 3     Prescription Details   Frequency (times per week) 3   Duration Progress to 45 minutes of aerobic exercise without signs/symptoms of physical distress     Intensity   THRR 40-80% of Max Heartrate 112-147   Ratings of Perceived Exertion 11-15   Perceived Dyspnea 0-4  Progression   Progression Continue to progress workloads to maintain  intensity without signs/symptoms of physical distress.     Resistance Training   Training Prescription Yes   Weight 3 lbs   Reps 10-15      Perform Capillary Blood Glucose checks as needed.  Exercise Prescription Changes:     Exercise Prescription Changes    Row Name 10/26/16 1400 10/29/16 1100 11/13/16 1200         Exercise Review   Progression -  walk test results  -  -       Response to Exercise   Blood Pressure (Admit) 128/74 114/68 128/70     Blood Pressure (Exercise) 126/54 124/68 136/66     Blood Pressure (Exit) 114/56 122/78 102/62     Heart Rate (Admit) 77 bpm 79 bpm 87 bpm     Heart Rate (Exercise) 110 bpm 94 bpm 102 bpm     Heart Rate (Exit) 84 bpm 83 bpm 86 bpm     Oxygen Saturation (Admit) 100 %  -  -     Oxygen Saturation (Exercise) 99 %  -  -     Rating of Perceived Exertion (Exercise) _0 Symptoms none none none     Duration  - Progress to 45 minutes of aerobic exercise without signs/symptoms of physical distress Progress to 45 minutes of aerobic exercise without signs/symptoms of physical distress     Intensity  - THRR unchanged THRR unchanged       Progression   Progression  - Continue to progress workloads to maintain intensity without signs/symptoms of physical distress. Continue to progress workloads to maintain intensity without signs/symptoms of physical distress.     Average METs  - 2.75 2.85       Resistance Training   Training Prescription  - Yes Yes     Weight  - 3 3     Reps  - 10-15 10-15       Interval Training   Interval Training  - No No       Treadmill   MPH  -  - 2.5     Grade  -  - 2     Minutes  -  - 15     METs  -  - 3.6       REL-XR   Level  - 3  -     Minutes  - 15  -     METs  - 3.3  -       T5 Nustep   Level  - 3 3     Minutes  - 15 15     METs  - 2.2 2.1        Exercise Comments:     Exercise Comments    Row Name 10/26/16 1453 10/29/16 1151 11/13/16 1209       Exercise Comments [name] wants to  increase his strength, stamina, and endurance.  He would like to be able to get back to work and life being stable. [name] did well his first full day of exercise. [name] has tolerated exercise very well in his first two weeks.  Staff will encourage him to attain his Target HR range as he has been below.        Discharge Exercise Prescription (Final Exercise Prescription Changes):     Exercise Prescription Changes - 11/13/16 1200      Response to Exercise   Blood Pressure (Admit) 128/70  Blood Pressure (Exercise) 136/66   Blood Pressure (Exit) 102/62   Heart Rate (Admit) 87 bpm   Heart Rate (Exercise) 102 bpm   Heart Rate (Exit) 86 bpm   Rating of Perceived Exertion (Exercise) 13   Symptoms none   Duration Progress to 45 minutes of aerobic exercise without signs/symptoms of physical distress   Intensity THRR unchanged     Progression   Progression Continue to progress workloads to maintain intensity without signs/symptoms of physical distress.   Average METs 2.85     Resistance Training   Training Prescription Yes   Weight 3   Reps 10-15     Interval Training   Interval Training No     Treadmill   MPH 2.5   Grade 2   Minutes 15   METs 3.6     T5 Nustep   Level 3   Minutes 15   METs 2.1      Nutrition:  Target Goals: Understanding of nutrition guidelines, daily intake of sodium <1531m, cholesterol <2074m calories 30% from fat and 7% or less from saturated fats, daily to have 5 or more servings of fruits and vegetables.  Biometrics:     Pre Biometrics - 10/26/16 1453      Pre Biometrics   Height 5' 5.5" (1.664 m)   Weight 165 lb 12.8 oz (75.2 kg)   Waist Circumference 35.5 inches   Hip Circumference 37.5 inches   Waist to Hip Ratio 0.95 %   BMI (Calculated) 27.2   Single Leg Stand 1.93 seconds       Nutrition Therapy Plan and Nutrition Goals:     Nutrition Therapy & Goals - 10/26/16 1228      Nutrition Therapy   Drug/Food Interactions  Statins/Certain Fruits;Coumadin/Vit K     Intervention Plan   Intervention Prescribe, educate and counsel regarding individualized specific dietary modifications aiming towards targeted core components such as weight, hypertension, lipid management, diabetes, heart failure and other comorbidities.   Expected Outcomes Short Term Goal: Understand basic principles of dietary content, such as calories, fat, sodium, cholesterol and nutrients.;Short Term Goal: A plan has been developed with personal nutrition goals set during dietitian appointment.;Long Term Goal: Adherence to prescribed nutrition plan.      Nutrition Discharge: Rate Your [name] Scores:     Nutrition Assessments - 10/26/16 1342      MEDFICTS Scores   Pre Score 78      Nutrition Goals Re-Evaluation:   Psychosocial: Target Goals: Acknowledge presence or absence of depression, maximize coping skills, provide positive support system. Participant is able to verbalize types and ability to use techniques and skills needed for reducing stress and depression.  Initial Review & Psychosocial Screening:     Initial Psych Review & Screening - 10/26/16 1232      Initial Review   Current issues with Current Depression;Current Stress Concerns     Family Dynamics   Good Support System? Yes   Comments JaThamass separated from his wife but his daughter was here today and was very supportive. JaSteffenaid he is a [place] lately has been depressed and usually sees the positive in life. Dameer's daughter emphasized and he added that this all started wtih a infected hair follicle while he was away for the weekend at the beach with his wife. The hair follicle infection grew and MD told him had to have surgery in the hospital-then due to his diabetes he had a heart attack. Delevlped MRSA infection then needed open  heart surgery.       Quality of Life Scores:     Quality of Life - 10/26/16 1235      Quality of Life Scores    Health/Function Pre 18.13 %   Socioeconomic Pre 19.86 %   Psych/Spiritual Pre 15.43 %   Family Pre 19.4 %   GLOBAL Pre 18.12 %      PHQ-9: Recent Review Flowsheet Data    Depression screen [facility] 2/9 10/26/2016   Decreased Interest 2   Down, Depressed, Hopeless 2   PHQ - 2 Score 4   Altered sleeping 1   Tired, decreased energy 1   Change in appetite 0   Feeling bad or failure about yourself  2   Trouble concentrating 0   Moving slowly or fidgety/restless 0   Suicidal thoughts 0   PHQ-9 Score 8   Difficult doing work/chores Somewhat difficult      Psychosocial Evaluation and Intervention:     Psychosocial Evaluation - 11/11/16 1723      Psychosocial Evaluation & Interventions   Interventions Encouraged to exercise with the program and follow exercise prescription;Stress management education;Relaxation education   Comments Counselor met with Mr. Marcello Moores [name]) today for initial psychosocial evaluation.  He is a 56 year old who had open heart surgery in November.  He has a strong support system with (2) adult daughters and his mother who all live close by.  [name] has diabetes in addition to his heart problems.  He reports sleeping "terribly" with several nights each week not able to go to sleep until 6-7 AM.  He stated his Dr. suggested Melatonin but he has tried without success and wondered if he needed a stronger dose.  Counselor encouraged him to consider this, since the Dr. recommended and to check with his pharmacist or Dr. Donivan Scull: dosage.  [name] has a good appetite currently.  He denies a history of depression or anxiety, but admits to current symptoms since the open heart surgery.  He reported he and his spouse separated before the surgery and his sister passed away last year.  He has also been experiencing tremors in his hands since the surgery and is scheduled to see a Neurosurgeon early February to evaluate this further.  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  He is not sure how he is going to be able to come to this class and go back to work with that type of schedule.  His goals for this program are to feel better and get over his depressive symptoms.  Counselor provided information on several psychiatrists since he states he is on something; but wonders if it might be causing the tremors.  Counselor will follow with [name] over the course of this program.    Continued Psychosocial Services Needed Yes  Follow with [name] re: depressive symptoms and sleep.  Gave info on psychiatrists as well.        Psychosocial Re-Evaluation:   Vocational Rehabilitation: Provide vocational rehab assistance to qualifying candidates.   Vocational Rehab Evaluation & Intervention:     Vocational Rehab - 10/26/16 1114      Initial Vocational Rehab Evaluation & Intervention   Assessment shows need for Vocational Rehabilitation No      Education: Education Goals: Education classes will be provided on a weekly basis, covering required topics. Participant will state understanding/return demonstration of topics  presented.  Learning Barriers/Preferences:     Learning Barriers/Preferences - 10/26/16 1115      Learning Barriers/Preferences   Learning Barriers None   Learning Preferences Individual Instruction      Education Topics: General Nutrition Guidelines/Fats and Fiber: -Group instruction provided by verbal, written material, models and posters to present the general guidelines for heart healthy nutrition. Gives an explanation and review of dietary fats and fiber.   Controlling Sodium/Reading Food Labels: -Group verbal and written material supporting the discussion of sodium use in heart healthy nutrition. Review and explanation with models, verbal and written materials for utilization of the food label. Flowsheet Row Cardiac Rehab from 11/11/2016 in [facility] Cardiac  and Pulmonary Rehab  Date  11/02/16  Educator  PI  Instruction Review Code  2- meets goals/outcomes      Exercise Physiology & Risk Factors: - Group verbal and written instruction with models to review the exercise physiology of the cardiovascular system and associated critical values. Details cardiovascular disease risk factors and the goals associated with each risk factor.   Aerobic Exercise & Resistance Training: - Gives group verbal and written discussion on the health impact of inactivity. On the components of aerobic and resistive training programs and the benefits of this training and how to safely progress through these programs. Flowsheet Row Cardiac Rehab from 11/11/2016 in [facility] Cardiac and Pulmonary Rehab  Date  11/11/16  Educator  AS  Instruction Review Code  2- meets goals/outcomes      Flexibility, Balance, General Exercise Guidelines: - Provides group verbal and written instruction on the benefits of flexibility and balance training programs. Provides general exercise guidelines with specific guidelines to those with heart or lung disease. Demonstration and skill practice provided.   Stress Management: - Provides group verbal and written instruction about the health risks of elevated stress, cause of high stress, and healthy ways to reduce stress.   Depression: - Provides group verbal and written instruction on the correlation between heart/lung disease and depressed mood, treatment options, and the stigmas associated with seeking treatment. Flowsheet Row Cardiac Rehab from 11/11/2016 in [facility] Cardiac and Pulmonary Rehab  Date  10/28/16  Educator  Lucianne Lei, MSW  Instruction Review Code  2- meets goals/outcomes      Anatomy & Physiology of the Heart: - Group verbal and written instruction and models provide basic cardiac anatomy and physiology, with the coronary electrical and arterial systems. Review of: AMI, Angina, Valve disease, Heart Failure, Cardiac  Arrhythmia, Pacemakers, and the ICD.   Cardiac Procedures: - Group verbal and written instruction and models to describe the testing methods done to diagnose heart disease. Reviews the outcomes of the test results. Describes the treatment choices: Medical Management, Angioplasty, or Coronary Bypass Surgery.   Cardiac Medications: - Group verbal and written instruction to review commonly prescribed medications for heart disease. Reviews the medication, class of the drug, and side effects. Includes the steps to properly store meds and maintain the prescription regimen.   Go Sex-Intimacy & Heart Disease, Get SMART - Goal Setting: - Group verbal and written instruction through game format to discuss heart disease and the return to sexual intimacy. Provides group verbal and written material to discuss and apply goal setting through the application of the S.M.A.R.T. Method.   Other Matters of the Heart: - Provides group verbal, written materials and models to describe Heart Failure, Angina, Valve Disease, and Diabetes in the realm of heart disease. Includes description of the disease process and treatment options available  to the cardiac patient.   Exercise & Equipment Safety: - Individual verbal instruction and demonstration of equipment use and safety with use of the equipment. Flowsheet Row Cardiac Rehab from 11/11/2016 in [facility] Cardiac and Pulmonary Rehab  Date  10/28/16  Educator  C. EnterkinRN  Instruction Review Code  1- partially meets, needs review/practice      Infection Prevention: - Provides verbal and written material to individual with discussion of infection control including proper hand washing and proper equipment cleaning during exercise session. Flowsheet Row Cardiac Rehab from 11/11/2016 in [facility] Cardiac and Pulmonary Rehab  Date  10/26/16  Educator  C. EnterkinRN  Instruction Review Code  2- meets goals/outcomes      Falls Prevention: - Provides verbal and written  material to individual with discussion of falls prevention and safety. Flowsheet Row Cardiac Rehab from 11/11/2016 in [facility] Cardiac and Pulmonary Rehab  Date  10/26/16  Educator  C. [place]  Instruction Review Code  2- meets goals/outcomes      Diabetes: - Individual verbal and written instruction to review signs/symptoms of diabetes, desired ranges of glucose level fasting, after meals and with exercise. Advice that pre and post exercise glucose checks will be done for 3 sessions at entry of program. [place] from 11/11/2016 in [facility] Cardiac and Pulmonary Rehab  Date  10/26/16  Educator  C. [place]  Instruction Review Code  1- partially meets, needs review/practice       Knowledge Questionnaire Score:     Knowledge Questionnaire Score - 10/26/16 1114      Knowledge Questionnaire Score   Pre Score 23      Core Components/Risk Factors/Patient Goals at Admission:     Personal Goals and Risk Factors at Admission - 10/26/16 1234      Core Components/Risk Factors/Patient Goals on Admission    Weight Management Yes;Weight Maintenance   Intervention Weight Management: Develop a combined nutrition and exercise program designed to reach desired caloric intake, while maintaining appropriate intake of nutrient and fiber, sodium and fats, and appropriate energy expenditure required for the weight goal.;Weight Management: Provide education and appropriate resources to help participant work on and attain dietary goals.   Admit Weight 165 lb 12.8 oz (75.2 kg)   Goal Weight: Short Term 165 lb (74.8 kg)   Goal Weight: Long Term 162 lb (73.5 kg)   Expected Outcomes Long Term: Adherence to nutrition and physical activity/exercise program aimed toward attainment of established weight goal;Short Term: Continue to assess and modify interventions until short term weight is achieved;Weight Maintenance: Understanding of the daily nutrition guidelines, which includes 25-35% calories  from fat, 7% or less cal from saturated fats, less than '200mg'$  cholesterol, less than 1.5gm of sodium, & 5 or more servings of fruits and vegetables daily   Increase Strength and Stamina Yes   Intervention Provide advice, education, support and counseling about physical activity/exercise needs.;Develop an individualized exercise prescription for aerobic and resistive training based on initial evaluation findings, risk stratification, comorbidities and participant's personal goals.   Expected Outcomes Achievement of increased cardiorespiratory fitness and enhanced flexibility, muscular endurance and strength shown through measurements of functional capacity and personal statement of participant.   Tobacco Cessation Yes   Intervention Assist the participant in steps to quit. Provide individualized education and counseling about committing to Tobacco Cessation, relapse prevention, and pharmacological support that can be provided by physician.;Advice worker, assist with locating and accessing local/national Quit Smoking programs, and support quit date choice.   Expected  Outcomes Long Term: Complete abstinence from all tobacco products for at least 12 months from quit date.   Diabetes Yes   Intervention Provide education about signs/symptoms and action to take for hypo/hyperglycemia.;Provide education about proper nutrition, including hydration, and aerobic/resistive exercise prescription along with prescribed medications to achieve blood glucose in normal ranges: Fasting glucose 65-99 mg/dL   Expected Outcomes Short Term: Participant verbalizes understanding of the signs/symptoms and immediate care of hyper/hypoglycemia, proper foot care and importance of medication, aerobic/resistive exercise and nutrition plan for blood glucose control.;Long Term: Attainment of HbA1C < 7%.   Hypertension Yes   Intervention Provide education on lifestyle modifcations including regular physical  activity/exercise, weight management, moderate sodium restriction and increased consumption of fresh fruit, vegetables, and low fat dairy, alcohol moderation, and smoking cessation.;Monitor prescription use compliance.   Expected Outcomes Short Term: Continued assessment and intervention until BP is < 140/37m HG in hypertensive participants. < 130/890mHG in hypertensive participants with diabetes, heart failure or chronic kidney disease.;Long Term: Maintenance of blood pressure at goal levels.   Lipids Yes   Intervention Provide education and support for participant on nutrition & aerobic/resistive exercise along with prescribed medications to achieve LDL <7058mHDL >92m45m Expected Outcomes Short Term: Participant states understanding of desired cholesterol values and is compliant with medications prescribed. Participant is following exercise prescription and nutrition guidelines.;Long Term: Cholesterol controlled with medications as prescribed, with individualized exercise RX and with personalized nutrition plan. Value goals: LDL < 70mg43mL > 40 mg.   Stress Yes   Intervention Offer individual and/or small group education and counseling on adjustment to heart disease, stress management and health-related lifestyle change. Teach and support self-help strategies.;Refer participants experiencing significant psychosocial distress to appropriate mental health specialists for further evaluation and treatment. When possible, include family members and significant others in education/counseling sessions.   Expected Outcomes Short Term: Participant demonstrates changes in health-related behavior, relaxation and other stress management skills, ability to obtain effective social support, and compliance with psychotropic medications if prescribed.;Long Term: Emotional wellbeing is indicated by absence of clinically significant psychosocial distress or social isolation.   Personal Goal Other Yes   Personal Goal --   JamesBayardeparated from his wife but his daughter was here today and was very supportive. JamesSimcha he is a ChrisPanamalately has been depressed and usually sees the positive in life. Dinari's daughter emphasized and he added that this all started wti   Intervention Use of exercise equipment in [place] to meet with Mental Health Counselor on staff.   Expected Outcomes To discuss with his doctor his limitations after his CABG 09/02/2016 but hopefully be able to get his stamina back for work.       Core Components/Risk Factors/Patient Goals Review:    Core Components/Risk Factors/Patient Goals at Discharge (Final Review):    ITP Comments:     ITP Comments    Row Name 10/26/16 1227 11/18/16 0654         ITP Comments ITP created during Medical review today. Documentation of diagnosis [facility] Admission 08/24/2016 CABG x3 30 day review. Continue with ITP unless directed changes per Medical Director review. New to program         Comments:

## 2016-11-18 NOTE — Progress Notes (Signed)
PCP is [name], NP Referring Provider is Troy Sine, MD  Chief Complaint  Patient presents with  . Routine Post Op    s/p CABG X 3...08/24/16    FOY:DXAJO well after multivessel CABG almost 3 months ago Making progress in cardiac rehabilitation Surgical incisions well-healed No symptoms of angina or CHF Ready to return to work February 12-return to work note provided Will stay on amiodarone 200 mg daily and Coumadin until cleared by his cardiologist to discontinue. Currently in sinus rhythm   Past Medical History:  Diagnosis Date  . Abrasion L FOREARM  . Atrial fibrillation ([place])    a. initially occurring in post-op setting in 05/2016, started on Eliquis b. 08/2016: s/p clipping of atrial appendage at time of CABG --> Eliquis switched to Coumadin  . CAD (coronary artery disease)    a. 05/2016: NSTEMI, cath showing 3v disease with CABG recommended once MRSA infection resolved. b. 08/2016: CABG with LIMA-LAD, SVG-D1, and SVG-RI.  Marland Kitchen Cancer ([place])    basal cell carcinoma of the skin  . Diabetes mellitus without complication ([place])   . Headache(784.0)   . Hematuria, microscopic "SINCE I WAS A KID"  . Hyperlipidemia   . Hypertension   . Ischemic cardiomyopathy    a. 05/2016: echo w/ EF of 25-35% and akinesis of the basal-midinferolateral and inferior myocardium.  . Lactose intolerance   . Myocardial infarction   . Respiratory failure ([place])   . Rotator cuff tear RIGHT  . Tobacco use    a. quit in 05/2016 following MI    Past Surgical History:  Procedure Laterality Date  . CARDIAC CATHETERIZATION N/A 06/16/2016   Procedure: Left Heart Cath and Coronary Angiography;  Surgeon: Troy Sine, MD;  Location: [facility];  Service: Cardiovascular;  Laterality: N/A;  . CLIPPING OF ATRIAL APPENDAGE N/A 08/24/2016   Procedure: CLIPPING OF ATRIAL APPENDAGE;  Surgeon: Ivin Poot, MD;  Location: [facility];  Service: Open Heart Surgery;  Laterality: N/A;  . CORONARY ARTERY  BYPASS GRAFT N/A 08/24/2016   Procedure: CORONARY ARTERY BYPASS GRAFTING (CABG) x three,  using left internal mammary artery and right leg greater saphenous vein harvested endoscopically;  Surgeon: Ivin Poot, MD;  Location: [facility];  Service: Open Heart Surgery;  Laterality: N/A;  . HERNIA REPAIR  X2  . INCISION AND DRAINAGE ABSCESS Left 06/10/2016   Procedure: INCISION AND DRAINAGE LEFT AXILLARY ABSCESS;  Surgeon: Autumn Messing III, MD;  Location: [facility];  Service: General;  Laterality: Left;  . ROTATOR CUFF REPAIR Bilateral   . TEE WITHOUT CARDIOVERSION N/A 08/24/2016   Procedure: TRANSESOPHAGEAL ECHOCARDIOGRAM (TEE);  Surgeon: Ivin Poot, MD;  Location: [facility];  Service: Open Heart Surgery;  Laterality: N/A;    Family History  Problem Relation Age of Onset  . Hypertension Father   . Sudden death Father   . Diabetes Father   . Hypertension Mother   . Heart attack Sister     Social History Social History  Substance Use Topics  . Smoking status: Former Smoker    Packs/day: 1.50  . Smokeless tobacco: Never Used     Comment: last use 07/2016  . Alcohol use No    Current Outpatient Prescriptions  Medication Sig Dispense Refill  . amiodarone (PACERONE) 200 MG tablet Take 1 tablet (200 mg total) by mouth daily. 30 tablet 2  . aspirin 81 MG chewable tablet Chew 1 tablet (81 mg total) by mouth daily. 30 tablet 3  .  atorvastatin (LIPITOR) 80 MG tablet Take 1 tablet (80 mg total) by mouth at bedtime. 90 tablet 3  . blood glucose meter kit and supplies KIT Dispense based on patient and insurance preference. Use up to four times daily as directed. (FOR ICD-9 250.00, 250.01). 1 each 0  . buPROPion (WELLBUTRIN XL) 150 MG 24 hr tablet Take 1 tablet (150 mg total) by mouth daily. 30 tablet 3  . glipiZIDE (GLUCOTROL) 10 MG tablet Take 1 tablet (10 mg total) by mouth 2 (two) times daily before a meal. 180 tablet 1  . hydrOXYzine (ATARAX/VISTARIL) 10 MG tablet Take 1 tablet (10 mg total) by mouth  3 (three) times daily as needed. 30 tablet 0  . metFORMIN (GLUCOPHAGE) 1000 MG tablet Take 1 tablet (1,000 mg total) by mouth 2 (two) times daily with a meal. 180 tablet 3  . metoprolol tartrate (LOPRESSOR) 25 MG tablet Take 1 tablet (25 mg total) by mouth 2 (two) times daily. 60 tablet 11  . ONE TOUCH ULTRA TEST test strip USE UP TO 4 TIMES DAILY AS DIRECTED  2  . ONETOUCH DELICA LANCETS 91Q MISC USE UP TO 4 TIMES DAILY AS DIRECTED  2  . warfarin (COUMADIN) 5 MG tablet Take 1 tablet (5 mg total) by mouth daily at 6 PM. As prescribed by coumadin clinic 100 tablet 1   No current facility-administered medications for this visit.     Allergies  Allergen Reactions  . Latex Rash    Review of Systems  Progressing well with cardiac rehabilitation No complaints  BP 122/83   Pulse 78   Resp 20   Ht '5\' 6"'  (1.676 m)   Wt 170 lb (77.1 kg)   SpO2 96%   BMI 27.44 kg/m  Physical Exam      Exam    General- alert and comfortable   Lungs- clear without rales, wheezes   Cor- regular rate and rhythm, no murmur , gallop   Abdomen- soft, non-tender   Extremities - warm, non-tender, minimal edema   Neuro- oriented, appropriate, no focal weakness   Diagnostic Tests: none  Impression: Excellent recovery almost 3 months Patient ready to return to work  Plan: Return as needed  Len Childs, MD [facility] [phone]

## 2016-11-19 ENCOUNTER — Encounter: Payer: BLUE CROSS/BLUE SHIELD | Attending: Internal Medicine

## 2016-11-19 DIAGNOSIS — Z951 Presence of aortocoronary bypass graft: Secondary | ICD-10-CM | POA: Insufficient documentation

## 2016-11-19 DIAGNOSIS — I2510 Atherosclerotic heart disease of native coronary artery without angina pectoris: Secondary | ICD-10-CM | POA: Insufficient documentation

## 2016-11-19 NOTE — Progress Notes (Signed)
Subjective:   [name] was seen in consultation in the movement disorder clinic at the request of [name], NP.  The records that were made available to me were reviewed.  The evaluation is for tremor.  Patient states that he has had tremor for  20-30 years (but that was mild), but the tremor seemed to get worse after his myocardial infarction in August, 2017.  He had CABG in November, 2017.  This hospital records were reviewed.  During that hospital admission, the patient was placed on amiodarone.  Daughter does state that tremors seemed to increase before amiodarone started but after first hospitalization.  Pt agrees but states that they have been worse since the CABG.  Pts A1C was found to be very elevated at over 12.  Pt states that He did strictly control his diet for several months and his hemoglobin A1c came down to 7 but then he was worse with diet and now it is 8.6.  Affected by caffeine:  Doesn't know (was drinking lots of coke - 10 per day - now 2-3 day) Affected by alcohol:  Doesn't drink Affected by stress:  No. Affected by fatigue:  No. Spills soup if on spoon:  Yes.   Spills glass of liquid if full:  Yes.   Affects ADL's (tying shoes, brushing teeth, etc):  Yes.    Current/Previously tried tremor medications: n/a  Current medications that may exacerbate tremor:  amiodarone  Outside reports reviewed: historical medical records, lab reports, office notes and referral letter/letters.  Allergies  Allergen Reactions  . Latex Rash    Outpatient Encounter Prescriptions as of 11/23/2016  Medication Sig  . amiodarone (PACERONE) 200 MG tablet Take 1 tablet (200 mg total) by mouth daily.  Marland Kitchen aspirin 81 MG chewable tablet Chew 1 tablet (81 mg total) by mouth daily.  Marland Kitchen atorvastatin (LIPITOR) 80 MG tablet Take 1 tablet (80 mg total) by mouth at bedtime.  Marland Kitchen buPROPion (WELLBUTRIN XL) 150 MG 24 hr tablet Take 1 tablet (150 mg total) by mouth daily.  Marland Kitchen glipiZIDE (GLUCOTROL) 10  MG tablet Take 1 tablet (10 mg total) by mouth 2 (two) times daily before a meal.  . hydrOXYzine (ATARAX/VISTARIL) 10 MG tablet Take 1 tablet (10 mg total) by mouth 3 (three) times daily as needed.  . metFORMIN (GLUCOPHAGE) 1000 MG tablet Take 1 tablet (1,000 mg total) by mouth 2 (two) times daily with a meal.  . metoprolol tartrate (LOPRESSOR) 25 MG tablet Take 1 tablet (25 mg total) by mouth 2 (two) times daily.  Marland Kitchen warfarin (COUMADIN) 5 MG tablet Take 1 tablet (5 mg total) by mouth daily at 6 PM. As prescribed by coumadin clinic  . blood glucose meter kit and supplies KIT Dispense based on patient and insurance preference. Use up to four times daily as directed. (FOR ICD-9 250.00, 250.01). (Patient not taking: Reported on 11/23/2016)  . ONE TOUCH ULTRA TEST test strip USE UP TO 4 TIMES DAILY AS DIRECTED  . ONETOUCH DELICA LANCETS 97W MISC USE UP TO 4 TIMES DAILY AS DIRECTED   No facility-administered encounter medications on file as of 11/23/2016.     Past Medical History:  Diagnosis Date  . Abrasion L FOREARM  . Atrial fibrillation ([place])    a. initially occurring in post-op setting in 05/2016, started on Eliquis b. 08/2016: s/p clipping of atrial appendage at time of CABG --> Eliquis switched to Coumadin  . CAD (coronary artery disease)    a. 05/2016: NSTEMI,  cath showing 3v disease with CABG recommended once MRSA infection resolved. b. 08/2016: CABG with LIMA-LAD, SVG-D1, and SVG-RI.  Marland Kitchen Cancer ([place])    basal cell carcinoma of the skin  . Diabetes mellitus without complication ([place])   . Headache(784.0)   . Hematuria, microscopic "SINCE I WAS A KID"  . Hyperlipidemia   . Hypertension   . Ischemic cardiomyopathy    a. 05/2016: echo w/ EF of 25-35% and akinesis of the basal-midinferolateral and inferior myocardium.  . Lactose intolerance   . Myocardial infarction   . Respiratory failure ([place])   . Rotator cuff tear RIGHT  . Tobacco use    a. quit in 05/2016 following MI    Past  Surgical History:  Procedure Laterality Date  . CARDIAC CATHETERIZATION N/A 06/16/2016   Procedure: Left Heart Cath and Coronary Angiography;  Surgeon: Troy Sine, MD;  Location: [facility];  Service: Cardiovascular;  Laterality: N/A;  . CLIPPING OF ATRIAL APPENDAGE N/A 08/24/2016   Procedure: CLIPPING OF ATRIAL APPENDAGE;  Surgeon: Ivin Poot, MD;  Location: [facility];  Service: Open Heart Surgery;  Laterality: N/A;  . CORONARY ARTERY BYPASS GRAFT N/A 08/24/2016   Procedure: CORONARY ARTERY BYPASS GRAFTING (CABG) x three,  using left internal mammary artery and right leg greater saphenous vein harvested endoscopically;  Surgeon: Ivin Poot, MD;  Location: [place] [facility];  Service: Open Heart Surgery;  Laterality: N/A;  . HERNIA REPAIR  X2  . INCISION AND DRAINAGE ABSCESS Left 06/10/2016   Procedure: INCISION AND DRAINAGE LEFT AXILLARY ABSCESS;  Surgeon: Autumn Messing III, MD;  [facility];  Service: General;  Laterality: Left;  . ROTATOR CUFF REPAIR Bilateral   . TEE WITHOUT CARDIOVERSION N/A 08/24/2016   Procedure: TRANSESOPHAGEAL ECHOCARDIOGRAM (TEE);  Surgeon: Ivin Poot, MD;  Location: [facility];  Service: Open Heart Surgery;  Laterality: N/A;    Social History   Social History  . Marital status: Married    Spouse name: N/A  . Number of children: N/A  . Years of education: N/A   Occupational History  . Magazine features editor   Social History Main Topics  . Smoking status: Current Some Day Smoker    Types: Cigarettes  . Smokeless tobacco: Never Used     Comment: last use 07/2016  . Alcohol use No  . Drug use: No  . Sexual activity: Yes   Other Topics Concern  . Not on file   Social History Narrative  . No narrative on file    Family Status  Relation Status  . Father Deceased  . Mother Alive  . Sister Deceased  . Brother Deceased  . Daughter Alive  . Daughter Alive    Review of Systems A complete 10 system ROS was obtained and was negative  apart from what is mentioned.   Objective:   VITALS:   Vitals:   11/23/16 1419  BP: 120/76  Pulse: 86  Weight: 175 lb (79.4 kg)  Height: _0  (1.651 m)   Gen:  Appears stated age and in NAD. HEENT:  Normocephalic, atraumatic. The mucous membranes are moist. The superficial temporal arteries are without ropiness or tenderness. Cardiovascular: Regular rate and rhythm. Lungs: Clear to auscultation bilaterally. Neck: There are no carotid bruits noted bilaterally.  NEUROLOGICAL:  Orientation:  The patient is alert and oriented x 3.  Recent and remote memory are intact.  Attention span and concentration are normal.  Able to name objects and  repeat without trouble.  Fund of knowledge is appropriate Cranial nerves: There is good facial symmetry. The pupils are equal round and reactive to light bilaterally. Fundoscopic exam reveals clear disc margins bilaterally. Extraocular muscles are intact and visual fields are full to confrontational testing. Speech is fluent and clear. Soft palate rises symmetrically and there is no tongue deviation. Hearing is intact to conversational tone. Tone: Tone is good throughout. Sensation: Sensation is intact to light touch and pinprick throughout (facial, trunk, extremities). Vibration is absent at the bilateral big toe and ankle and decreased at the knee. There is no extinction with double simultaneous stimulation. There is no sensory dermatomal level identified. Coordination:  The patient has no dysdiadichokinesia or dysmetria. Motor: Strength is 5/5 in the bilateral upper and lower extremities.  Shoulder shrug is equal bilaterally.  There is no pronator drift.  There are no fasciculations noted. DTR's: Deep tendon reflexes are 2/4 at the bilateral biceps, triceps, brachioradialis, 1/4 at the bilateral patella and absent at the bilateral achilles.  Plantar responses are downgoing bilaterally. Gait and Station: The patient is able to ambulate without difficulty.  The patient is able to heel toe walk without any difficulty. The patient is able to ambulate in a tandem fashion. The patient is able to stand in the Romberg position.   MOVEMENT EXAM: Tremor:  There is tremor in the UE, noted most significantly with action.  The left is worse than the right.  The patient is able to draw Archimedes spirals without significant difficulty but tremor is evident especially in the L hand.  There is no tremor at rest.  The patient is able to pour water from one glass to another without spilling a significant amount, but he does some and tremor is certainly evident.     Assessment/Plan:   1.  Tremor  -Long discussion with the patient today.  It sounds like he may have always had a small degree of baseline, possibly essential tremor, but once placed on amiodarone the tremor got much worse.  Discussed with him that one third of patients on amiodarone have a significant degree of tremor.  I did not advise the patient stop or alter the medication in any way, but did tell the patient that if the medication is stopped for any reason, it can take up to 6 months to know if the tremor was from the medication.  I did note from his daughter that tremor may have picked up after his initial myocardial infarction in August.  He had RAD had A. fib at that point, but apparently was not placed on amiodarone.  He did have very uncontrolled diabetes which was identified, which certainly can contribute to tremor, but I'm not exactly sure why this started picking up after the initial myocardial infarction in August.  The patient is clear that it got worse after his hospitalization in November, at which point amiodarone was started.  I did tell him that if he is able to get off of amiodarone and still has tremor, we can certainly try a tremor drug.  If he is not able to get off of the amiodarone, then we can still try a tremor medication in the future, but I still want him to follow-up with Dr. Debara Pickett  before we start anything.  2.  Tobacco abuse  -talked to him about overall health and wellness and d/c this.  He had d/c it but picked it back up  3.  Pt will let me know if/when  needs f/u after he sees cardiology.  Much greater than 50% of this visit was spent in counseling and coordinating care.  Total face to face time:  60 min.      CC:  [name], NP

## 2016-11-23 ENCOUNTER — Encounter: Payer: Self-pay | Admitting: Neurology

## 2016-11-23 ENCOUNTER — Ambulatory Visit (INDEPENDENT_AMBULATORY_CARE_PROVIDER_SITE_OTHER): Payer: BLUE CROSS/BLUE SHIELD | Admitting: Neurology

## 2016-11-23 VITALS — BP 120/76 | HR 86 | Ht 65.0 in | Wt 175.0 lb

## 2016-11-23 DIAGNOSIS — G250 Essential tremor: Secondary | ICD-10-CM

## 2016-11-23 DIAGNOSIS — Z720 Tobacco use: Secondary | ICD-10-CM

## 2016-11-23 DIAGNOSIS — G251 Drug-induced tremor: Secondary | ICD-10-CM

## 2016-11-24 ENCOUNTER — Ambulatory Visit (HOSPITAL_COMMUNITY): Payer: BLUE CROSS/BLUE SHIELD | Attending: Cardiology

## 2016-11-24 ENCOUNTER — Other Ambulatory Visit: Payer: Self-pay

## 2016-11-24 DIAGNOSIS — E785 Hyperlipidemia, unspecified: Secondary | ICD-10-CM | POA: Diagnosis not present

## 2016-11-24 DIAGNOSIS — Z951 Presence of aortocoronary bypass graft: Secondary | ICD-10-CM

## 2016-11-24 DIAGNOSIS — I252 Old myocardial infarction: Secondary | ICD-10-CM | POA: Diagnosis not present

## 2016-11-24 DIAGNOSIS — I348 Other nonrheumatic mitral valve disorders: Secondary | ICD-10-CM | POA: Insufficient documentation

## 2016-11-24 DIAGNOSIS — I77810 Thoracic aortic ectasia: Secondary | ICD-10-CM | POA: Diagnosis not present

## 2016-11-24 DIAGNOSIS — I119 Hypertensive heart disease without heart failure: Secondary | ICD-10-CM | POA: Diagnosis not present

## 2016-11-24 DIAGNOSIS — I255 Ischemic cardiomyopathy: Secondary | ICD-10-CM | POA: Diagnosis not present

## 2016-11-24 DIAGNOSIS — Z87891 Personal history of nicotine dependence: Secondary | ICD-10-CM | POA: Insufficient documentation

## 2016-11-24 DIAGNOSIS — I2510 Atherosclerotic heart disease of native coronary artery without angina pectoris: Secondary | ICD-10-CM | POA: Insufficient documentation

## 2016-11-26 ENCOUNTER — Telehealth: Payer: Self-pay

## 2016-11-26 NOTE — Telephone Encounter (Signed)
LMOM

## 2016-11-27 ENCOUNTER — Encounter: Payer: Self-pay | Admitting: *Deleted

## 2016-11-27 ENCOUNTER — Telehealth: Payer: Self-pay | Admitting: *Deleted

## 2016-11-27 DIAGNOSIS — Z951 Presence of aortocoronary bypass graft: Secondary | ICD-10-CM

## 2016-11-27 NOTE — Progress Notes (Signed)
I will probably stop amiodarone when I see him on 2/19. Already communicated with Dr. Carles Collet about his tremors.  Thanks.  Dr. Lemmie Evens

## 2016-11-27 NOTE — Progress Notes (Signed)
Cardiac Individual Treatment Plan  Patient Details  Name: [name] MRN: [id number] Date of Birth: [birth year] Referring Provider:   Flowsheet Row Cardiac Rehab from 10/26/2016 in [facility] Cardiac and Pulmonary Rehab  Referring Provider  Lyman Bishop MD      Initial Encounter Date:  Flowsheet Row Cardiac Rehab from 10/26/2016 in [facility] Cardiac and Pulmonary Rehab  Date  10/26/16  Referring Provider  Lyman Bishop MD      Visit Diagnosis: S/P CABG x 3  Patient's Home Medications on Admission:  Current Outpatient Prescriptions:  .  amiodarone (PACERONE) 200 MG tablet, Take 1 tablet (200 mg total) by mouth daily., Disp: 30 tablet, Rfl: 2 .  aspirin 81 MG chewable tablet, Chew 1 tablet (81 mg total) by mouth daily., Disp: 30 tablet, Rfl: 3 .  atorvastatin (LIPITOR) 80 MG tablet, Take 1 tablet (80 mg total) by mouth at bedtime., Disp: 90 tablet, Rfl: 3 .  blood glucose meter kit and supplies KIT, Dispense based on patient and insurance preference. Use up to four times daily as directed. (FOR ICD-9 250.00, 250.01). (Patient not taking: Reported on 11/23/2016), Disp: 1 each, Rfl: 0 .  buPROPion (WELLBUTRIN XL) 150 MG 24 hr tablet, Take 1 tablet (150 mg total) by mouth daily., Disp: 30 tablet, Rfl: 3 .  glipiZIDE (GLUCOTROL) 10 MG tablet, Take 1 tablet (10 mg total) by mouth 2 (two) times daily before a meal., Disp: 180 tablet, Rfl: 1 .  hydrOXYzine (ATARAX/VISTARIL) 10 MG tablet, Take 1 tablet (10 mg total) by mouth 3 (three) times daily as needed., Disp: 30 tablet, Rfl: 0 .  metFORMIN (GLUCOPHAGE) 1000 MG tablet, Take 1 tablet (1,000 mg total) by mouth 2 (two) times daily with a meal., Disp: 180 tablet, Rfl: 3 .  metoprolol tartrate (LOPRESSOR) 25 MG tablet, Take 1 tablet (25 mg total) by mouth 2 (two) times daily., Disp: 60 tablet, Rfl: 11 .  ONE TOUCH ULTRA TEST test strip, USE UP TO 4 TIMES DAILY AS DIRECTED, Disp: , Rfl: 2 .  ONETOUCH DELICA LANCETS 62O MISC, USE UP TO 4 TIMES DAILY AS  DIRECTED, Disp: , Rfl: 2 .  warfarin (COUMADIN) 5 MG tablet, Take 1 tablet (5 mg total) by mouth daily at 6 PM. As prescribed by coumadin clinic, Disp: 100 tablet, Rfl: 1  Past Medical History: Past Medical History:  Diagnosis Date  . Abrasion L FOREARM  . Atrial fibrillation ([place])    a. initially occurring in post-op setting in 05/2016, started on Eliquis b. 08/2016: s/p clipping of atrial appendage at time of CABG --> Eliquis switched to Coumadin  . CAD (coronary artery disease)    a. 05/2016: NSTEMI, cath showing 3v disease with CABG recommended once MRSA infection resolved. b. 08/2016: CABG with LIMA-LAD, SVG-D1, and SVG-RI.  Marland Kitchen Cancer ([place])    basal cell carcinoma of the skin  . Diabetes mellitus without complication ([place])   . Headache(784.0)   . Hematuria, microscopic "SINCE I WAS A KID"  . Hyperlipidemia   . Hypertension   . Ischemic cardiomyopathy    a. 05/2016: echo w/ EF of 25-35% and akinesis of the basal-midinferolateral and inferior myocardium.  . Lactose intolerance   . Myocardial infarction   . Respiratory failure ([place])   . Rotator cuff tear RIGHT  . Tobacco use    a. quit in 05/2016 following MI    Tobacco Use: History  Smoking Status  . Current Some Day Smoker  . Types: Cigarettes  Smokeless Tobacco  .  Never Used    Comment: last use 07/2016    Labs: Recent Review Flowsheet Data    Labs for ITP Cardiac and Pulmonary Rehab Latest Ref Rng & Units 08/29/2016 08/30/2016 08/31/2016 09/22/2016 11/17/2016   Cholestrol 0 - 200 mg/dL - - - - -   LDLCALC 0 - 99 mg/dL - - - - -   LDLDIRECT mg/dL - - - - -   HDL >39.00 mg/dL - - - - -   Trlycerides 0.0 - 149.0 mg/dL - - - - -   Hemoglobin A1c - - - - 7.2 8.6   PHART 7.350 - 7.450 - - - - -   PCO2ART 32.0 - 48.0 mmHg - - - - -   HCO3 20.0 - 28.0 mmol/L - - - - -   TCO2 0 - 100 mmol/L - - - - -   ACIDBASEDEF 0.0 - 2.0 mmol/L - - - - -   O2SAT % 45.3 49.8 50.9 - -       Exercise Target Goals:    Exercise  Program Goal: Individual exercise prescription set with THRR, safety & activity barriers. Participant demonstrates ability to understand and report RPE using BORG scale, to self-measure pulse accurately, and to acknowledge the importance of the exercise prescription.  Exercise Prescription Goal: Starting with aerobic activity 30 plus minutes a day, 3 days per week for initial exercise prescription. Provide home exercise prescription and guidelines that participant acknowledges understanding prior to discharge.  Activity Barriers & Risk Stratification:     Activity Barriers & Cardiac Risk Stratification - 10/26/16 1115      Activity Barriers & Cardiac Risk Stratification   Activity Barriers Deconditioning;Muscular Weakness;Other (comment)   Comments Hx Right Shoulder Rotator Cuff repair   Cardiac Risk Stratification High      6 Minute Walk:     6 Minute Walk    Row Name 10/26/16 1449         6 Minute Walk   Phase Initial     Distance 1460 feet     Walk Time 6 minutes     # of Rest Breaks 0     MPH 2.77     METS 4.04     RPE 12     VO2 Peak 14.14     Symptoms No     Resting HR 77 bpm     Resting BP 128/74     Max Ex. HR 110 bpm     Max Ex. BP 126/54     2 Minute Post BP 114/56        Initial Exercise Prescription:     Initial Exercise Prescription - 10/26/16 1400      Date of Initial Exercise RX and Referring Provider   Date 10/26/16   Referring Provider Lyman Bishop MD     Treadmill   MPH 2.5   Grade 2   Minutes 15   METs 3.6     REL-XR   Level 3   Watts --  speed >50   Minutes 15   METs 3     T5 Nustep   Level 3   Watts --  80-100 spm   Minutes 15   METs 3     Prescription Details   Frequency (times per week) 3   Duration Progress to 45 minutes of aerobic exercise without signs/symptoms of physical distress     Intensity   THRR 40-80% of Max Heartrate 112-147   Ratings of Perceived Exertion  11-15   Perceived Dyspnea 0-4      Progression   Progression Continue to progress workloads to maintain intensity without signs/symptoms of physical distress.     Resistance Training   Training Prescription Yes   Weight 3 lbs   Reps 10-15      Perform Capillary Blood Glucose checks as needed.  Exercise Prescription Changes:     Exercise Prescription Changes    Row Name 10/26/16 1400 10/29/16 1100 11/13/16 1200         Exercise Review   Progression -  walk test results  -  -       Response to Exercise   Blood Pressure (Admit) 128/74 114/68 128/70     Blood Pressure (Exercise) 126/54 124/68 136/66     Blood Pressure (Exit) 114/56 122/78 102/62     Heart Rate (Admit) 77 bpm 79 bpm 87 bpm     Heart Rate (Exercise) 110 bpm 94 bpm 102 bpm     Heart Rate (Exit) 84 bpm 83 bpm 86 bpm     Oxygen Saturation (Admit) 100 %  -  -     Oxygen Saturation (Exercise) 99 %  -  -     Rating of Perceived Exertion (Exercise) '12 13 13     ' Symptoms none none none     Duration  - Progress to 45 minutes of aerobic exercise without signs/symptoms of physical distress Progress to 45 minutes of aerobic exercise without signs/symptoms of physical distress     Intensity  - THRR unchanged THRR unchanged       Progression   Progression  - Continue to progress workloads to maintain intensity without signs/symptoms of physical distress. Continue to progress workloads to maintain intensity without signs/symptoms of physical distress.     Average METs  - 2.75 2.85       Resistance Training   Training Prescription  - Yes Yes     Weight  - 3 3     Reps  - 10-15 10-15       Interval Training   Interval Training  - No No       Treadmill   MPH  -  - 2.5     Grade  -  - 2     Minutes  -  - 15     METs  -  - 3.6       REL-XR   Level  - 3  -     Minutes  - 15  -     METs  - 3.3  -       T5 Nustep   Level  - 3 3     Minutes  - 15 15     METs  - 2.2 2.1        Exercise Comments:     Exercise Comments    Row Name 10/26/16 1453  10/29/16 1151 11/13/16 1209       Exercise Comments Pilar Plate wants to increase his strength, stamina, and endurance.  He would like to be able to get back to work and life being stable. Pilar Plate did well his first full day of exercise. Pilar Plate has tolerated exercise very well in his first two weeks.  Staff will encourage him to attain his Target HR range as he has been below.        Discharge Exercise Prescription (Final Exercise Prescription Changes):     Exercise Prescription Changes - 11/13/16 1200  Response to Exercise   Blood Pressure (Admit) 128/70   Blood Pressure (Exercise) 136/66   Blood Pressure (Exit) 102/62   Heart Rate (Admit) 87 bpm   Heart Rate (Exercise) 102 bpm   Heart Rate (Exit) 86 bpm   Rating of Perceived Exertion (Exercise) 13   Symptoms none   Duration Progress to 45 minutes of aerobic exercise without signs/symptoms of physical distress   Intensity THRR unchanged     Progression   Progression Continue to progress workloads to maintain intensity without signs/symptoms of physical distress.   Average METs 2.85     Resistance Training   Training Prescription Yes   Weight 3   Reps 10-15     Interval Training   Interval Training No     Treadmill   MPH 2.5   Grade 2   Minutes 15   METs 3.6     T5 Nustep   Level 3   Minutes 15   METs 2.1      Nutrition:  Target Goals: Understanding of nutrition guidelines, daily intake of sodium <1570m, cholesterol <2063m calories 30% from fat and 7% or less from saturated fats, daily to have 5 or more servings of fruits and vegetables.  Biometrics:     Pre Biometrics - 10/26/16 1453      Pre Biometrics   Height 5' 5.5" (1.664 m)   Weight 165 lb 12.8 oz (75.2 kg)   Waist Circumference 35.5 inches   Hip Circumference 37.5 inches   Waist to Hip Ratio 0.95 %   BMI (Calculated) 27.2   Single Leg Stand 1.93 seconds       Nutrition Therapy Plan and Nutrition Goals:     Nutrition Therapy & Goals -  10/26/16 1228      Nutrition Therapy   Drug/Food Interactions Statins/Certain Fruits;Coumadin/Vit K     Intervention Plan   Intervention Prescribe, educate and counsel regarding individualized specific dietary modifications aiming towards targeted core components such as weight, hypertension, lipid management, diabetes, heart failure and other comorbidities.   Expected Outcomes Short Term Goal: Understand basic principles of dietary content, such as calories, fat, sodium, cholesterol and nutrients.;Short Term Goal: A plan has been developed with personal nutrition goals set during dietitian appointment.;Long Term Goal: Adherence to prescribed nutrition plan.      Nutrition Discharge: Rate Your Plate Scores:     Nutrition Assessments - 10/26/16 1342      MEDFICTS Scores   Pre Score 78      Nutrition Goals Re-Evaluation:   Psychosocial: Target Goals: Acknowledge presence or absence of depression, maximize coping skills, provide positive support system. Participant is able to verbalize types and ability to use techniques and skills needed for reducing stress and depression.  Initial Review & Psychosocial Screening:     Initial Psych Review & Screening - 10/26/16 1232      Initial Review   Current issues with Current Depression;Current Stress Concerns     Family Dynamics   Good Support System? Yes   Comments JaBraylons separated from his wife but his daughter was here today and was very supportive. JaGabrialaid he is a [place] lately has been depressed and usually sees the positive in life. Jovon's daughter emphasized and he added that this all started wtih a infected hair follicle while he was away for the weekend at the beach with his wife. The hair follicle infection grew and MD told him had to have surgery in the hospital-then due to his diabetes  he had a heart attack. Delevlped MRSA infection then needed open heart surgery.       Quality of Life Scores:     Quality of  Life - 10/26/16 1235      Quality of Life Scores   Health/Function Pre 18.13 %   Socioeconomic Pre 19.86 %   Psych/Spiritual Pre 15.43 %   Family Pre 19.4 %   GLOBAL Pre 18.12 %      PHQ-9: Recent Review Flowsheet Data    Depression screen [facility] 2/9 10/26/2016   Decreased Interest 2   Down, Depressed, Hopeless 2   PHQ - 2 Score 4   Altered sleeping 1   Tired, decreased energy 1   Change in appetite 0   Feeling bad or failure about yourself  2   Trouble concentrating 0   Moving slowly or fidgety/restless 0   Suicidal thoughts 0   PHQ-9 Score 8   Difficult doing work/chores Somewhat difficult      Psychosocial Evaluation and Intervention:     Psychosocial Evaluation - 11/11/16 1723      Psychosocial Evaluation & Interventions   Interventions Encouraged to exercise with the program and follow exercise prescription;Stress management education;Relaxation education   Comments Counselor met with Mr. Marcello Moores Pilar Plate) today for initial psychosocial evaluation.  He is a 56 year old who had open heart surgery in November.  He has a strong support system with (2) adult daughters and his mother who all live close by.  Pilar Plate has diabetes in addition to his heart problems.  He reports sleeping "terribly" with several nights each week not able to go to sleep until 6-7 AM.  He stated his Dr. suggested Melatonin but he has tried without success and wondered if he needed a stronger dose.  Counselor encouraged him to consider this, since the Dr. recommended and to check with his pharmacist or Dr. Donivan Scull: dosage.  Pilar Plate has a good appetite currently.  He denies a history of depression or anxiety, but admits to current symptoms since the open heart surgery.  He reported he and his spouse separated before the surgery and his sister passed away last year.  He has also been experiencing tremors in his hands since the surgery and is scheduled to see a Neurosurgeon early February to evaluate this further.  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  He is not sure how he is going to be able to come to this class and go back to work with that type of schedule.  His goals for this program are to feel better and get over his depressive symptoms.  Counselor provided information on several psychiatrists since he states he is on something; but wonders if it might be causing the tremors.  Counselor will follow with Pilar Plate over the course of this program.    Continued Psychosocial Services Needed Yes  Follow with Pilar Plate re: depressive symptoms and sleep.  Gave info on psychiatrists as well.        Psychosocial Re-Evaluation:   Vocational Rehabilitation: Provide vocational rehab assistance to qualifying candidates.   Vocational Rehab Evaluation & Intervention:     Vocational Rehab - 10/26/16 1114      Initial Vocational Rehab Evaluation & Intervention   Assessment shows need for Vocational Rehabilitation No      Education: Education Goals: Education classes will be provided on a weekly  basis, covering required topics. Participant will state understanding/return demonstration of topics presented.  Learning Barriers/Preferences:     Learning Barriers/Preferences - 10/26/16 1115      Learning Barriers/Preferences   Learning Barriers None   Learning Preferences Individual Instruction      Education Topics: General Nutrition Guidelines/Fats and Fiber: -Group instruction provided by verbal, written material, models and posters to present the general guidelines for heart healthy nutrition. Gives an explanation and review of dietary fats and fiber.   Controlling Sodium/Reading Food Labels: -Group verbal and written material supporting the discussion of sodium use in heart healthy nutrition. Review and explanation with models, verbal and written materials for utilization of the food  label. Flowsheet Row Cardiac Rehab from 11/11/2016 in [facility] Cardiac and Pulmonary Rehab  Date  11/02/16  Educator  PI  Instruction Review Code  2- meets goals/outcomes      Exercise Physiology & Risk Factors: - Group verbal and written instruction with models to review the exercise physiology of the cardiovascular system and associated critical values. Details cardiovascular disease risk factors and the goals associated with each risk factor.   Aerobic Exercise & Resistance Training: - Gives group verbal and written discussion on the health impact of inactivity. On the components of aerobic and resistive training programs and the benefits of this training and how to safely progress through these programs. Flowsheet Row Cardiac Rehab from 11/11/2016 in [facility] Cardiac and Pulmonary Rehab  Date  11/11/16  Educator  AS  Instruction Review Code  2- meets goals/outcomes      Flexibility, Balance, General Exercise Guidelines: - Provides group verbal and written instruction on the benefits of flexibility and balance training programs. Provides general exercise guidelines with specific guidelines to those with heart or lung disease. Demonstration and skill practice provided.   Stress Management: - Provides group verbal and written instruction about the health risks of elevated stress, cause of high stress, and healthy ways to reduce stress.   Depression: - Provides group verbal and written instruction on the correlation between heart/lung disease and depressed mood, treatment options, and the stigmas associated with seeking treatment. Flowsheet Row Cardiac Rehab from 11/11/2016 in [facility] Cardiac and Pulmonary Rehab  Date  10/28/16  Educator  Lucianne Lei, MSW  Instruction Review Code  2- meets goals/outcomes      Anatomy & Physiology of the Heart: - Group verbal and written instruction and models provide basic cardiac anatomy and physiology, with the coronary electrical and arterial systems.  Review of: AMI, Angina, Valve disease, Heart Failure, Cardiac Arrhythmia, Pacemakers, and the ICD.   Cardiac Procedures: - Group verbal and written instruction and models to describe the testing methods done to diagnose heart disease. Reviews the outcomes of the test results. Describes the treatment choices: Medical Management, Angioplasty, or Coronary Bypass Surgery.   Cardiac Medications: - Group verbal and written instruction to review commonly prescribed medications for heart disease. Reviews the medication, class of the drug, and side effects. Includes the steps to properly store meds and maintain the prescription regimen.   Go Sex-Intimacy & Heart Disease, Get SMART - Goal Setting: - Group verbal and written instruction through game format to discuss heart disease and the return to sexual intimacy. Provides group verbal and written material to discuss and apply goal setting through the application of the S.M.A.R.T. Method.   Other Matters of the Heart: - Provides group verbal, written materials and models to describe Heart Failure, Angina, Valve Disease, and Diabetes in the realm of heart  disease. Includes description of the disease process and treatment options available to the cardiac patient.   Exercise & Equipment Safety: - Individual verbal instruction and demonstration of equipment use and safety with use of the equipment. Flowsheet Row Cardiac Rehab from 11/11/2016 in [facility] Cardiac and Pulmonary Rehab  Date  10/28/16  Educator  C. EnterkinRN  Instruction Review Code  1- partially meets, needs review/practice      Infection Prevention: - Provides verbal and written material to individual with discussion of infection control including proper hand washing and proper equipment cleaning during exercise session. Flowsheet Row Cardiac Rehab from 11/11/2016 in [facility] Cardiac and Pulmonary Rehab  Date  10/26/16  Educator  C. EnterkinRN  Instruction Review Code  2- meets goals/outcomes       Falls Prevention: - Provides verbal and written material to individual with discussion of falls prevention and safety. Flowsheet Row Cardiac Rehab from 11/11/2016 in [facility] Cardiac and Pulmonary Rehab  Date  10/26/16  Educator  C. [place]  Instruction Review Code  2- meets goals/outcomes      Diabetes: - Individual verbal and written instruction to review signs/symptoms of diabetes, desired ranges of glucose level fasting, after meals and with exercise. Advice that pre and post exercise glucose checks will be done for 3 sessions at entry of program. [place] from 11/11/2016 in [facility] Cardiac and Pulmonary Rehab  Date  10/26/16  Educator  C. [place]  Instruction Review Code  1- partially meets, needs review/practice       Knowledge Questionnaire Score:     Knowledge Questionnaire Score - 10/26/16 1114      Knowledge Questionnaire Score   Pre Score 23      Core Components/Risk Factors/Patient Goals at Admission:     Personal Goals and Risk Factors at Admission - 10/26/16 1234      Core Components/Risk Factors/Patient Goals on Admission    Weight Management Yes;Weight Maintenance   Intervention Weight Management: Develop a combined nutrition and exercise program designed to reach desired caloric intake, while maintaining appropriate intake of nutrient and fiber, sodium and fats, and appropriate energy expenditure required for the weight goal.;Weight Management: Provide education and appropriate resources to help participant work on and attain dietary goals.   Admit Weight 165 lb 12.8 oz (75.2 kg)   Goal Weight: Short Term 165 lb (74.8 kg)   Goal Weight: Long Term 162 lb (73.5 kg)   Expected Outcomes Long Term: Adherence to nutrition and physical activity/exercise program aimed toward attainment of established weight goal;Short Term: Continue to assess and modify interventions until short term weight is achieved;Weight Maintenance: Understanding of the  daily nutrition guidelines, which includes 25-35% calories from fat, 7% or less cal from saturated fats, less than 243m cholesterol, less than 1.5gm of sodium, & 5 or more servings of fruits and vegetables daily   Increase Strength and Stamina Yes   Intervention Provide advice, education, support and counseling about physical activity/exercise needs.;Develop an individualized exercise prescription for aerobic and resistive training based on initial evaluation findings, risk stratification, comorbidities and participant's personal goals.   Expected Outcomes Achievement of increased cardiorespiratory fitness and enhanced flexibility, muscular endurance and strength shown through measurements of functional capacity and personal statement of participant.   Tobacco Cessation Yes   Intervention Assist the participant in steps to quit. Provide individualized education and counseling about committing to Tobacco Cessation, relapse prevention, and pharmacological support that can be provided by physician.;OAdvice worker assist with locating and accessing local/national  Quit Smoking programs, and support quit date choice.   Expected Outcomes Long Term: Complete abstinence from all tobacco products for at least 12 months from quit date.   Diabetes Yes   Intervention Provide education about signs/symptoms and action to take for hypo/hyperglycemia.;Provide education about proper nutrition, including hydration, and aerobic/resistive exercise prescription along with prescribed medications to achieve blood glucose in normal ranges: Fasting glucose 65-99 mg/dL   Expected Outcomes Short Term: Participant verbalizes understanding of the signs/symptoms and immediate care of hyper/hypoglycemia, proper foot care and importance of medication, aerobic/resistive exercise and nutrition plan for blood glucose control.;Long Term: Attainment of HbA1C < 7%.   Hypertension Yes   Intervention Provide education on  lifestyle modifcations including regular physical activity/exercise, weight management, moderate sodium restriction and increased consumption of fresh fruit, vegetables, and low fat dairy, alcohol moderation, and smoking cessation.;Monitor prescription use compliance.   Expected Outcomes Short Term: Continued assessment and intervention until BP is < 140/82m HG in hypertensive participants. < 130/849mHG in hypertensive participants with diabetes, heart failure or chronic kidney disease.;Long Term: Maintenance of blood pressure at goal levels.   Lipids Yes   Intervention Provide education and support for participant on nutrition & aerobic/resistive exercise along with prescribed medications to achieve LDL <7052mHDL >36m66m Expected Outcomes Short Term: Participant states understanding of desired cholesterol values and is compliant with medications prescribed. Participant is following exercise prescription and nutrition guidelines.;Long Term: Cholesterol controlled with medications as prescribed, with individualized exercise RX and with personalized nutrition plan. Value goals: LDL < 70mg16mL > 40 mg.   Stress Yes   Intervention Offer individual and/or small group education and counseling on adjustment to heart disease, stress management and health-related lifestyle change. Teach and support self-help strategies.;Refer participants experiencing significant psychosocial distress to appropriate mental health specialists for further evaluation and treatment. When possible, include family members and significant others in education/counseling sessions.   Expected Outcomes Short Term: Participant demonstrates changes in health-related behavior, relaxation and other stress management skills, ability to obtain effective social support, and compliance with psychotropic medications if prescribed.;Long Term: Emotional wellbeing is indicated by absence of clinically significant psychosocial distress or social  isolation.   Personal Goal Other Yes   Personal Goal --  JamesTeandreeparated from his wife but his daughter was here today and was very supportive. JamesDakotah he is a ChrisPanamalately has been depressed and usually sees the positive in life. Zalmen's daughter emphasized and he added that this all started wti   Intervention Use of exercise equipment in [place] to meet with Mental Health Counselor on staff.   Expected Outcomes To discuss with his doctor his limitations after his CABG 09/02/2016 but hopefully be able to get his stamina back for work.       Core Components/Risk Factors/Patient Goals Review:    Core Components/Risk Factors/Patient Goals at Discharge (Final Review):    ITP Comments:     ITP Comments    Row Name 10/26/16 1227 11/18/16 0654         ITP Comments ITP created during Medical review today. Documentation of diagnosis [facility] Admission 08/24/2016 CABG x3 30 day review. Continue with ITP unless directed changes per Medical Director review. New to program         Comments:

## 2016-11-27 NOTE — Telephone Encounter (Signed)
I left vm to check on him since he has not been in [employer] lately. Noticed in chart he has been to [facility] MD since his essential tremor of 30 years he reports got worse after his MI.

## 2016-12-07 ENCOUNTER — Ambulatory Visit (INDEPENDENT_AMBULATORY_CARE_PROVIDER_SITE_OTHER): Payer: BLUE CROSS/BLUE SHIELD | Admitting: Internal Medicine

## 2016-12-07 ENCOUNTER — Encounter: Payer: Self-pay | Admitting: Internal Medicine

## 2016-12-07 VITALS — BP 150/84 | HR 82 | Ht 65.0 in | Wt 176.0 lb

## 2016-12-07 DIAGNOSIS — I255 Ischemic cardiomyopathy: Secondary | ICD-10-CM | POA: Insufficient documentation

## 2016-12-07 DIAGNOSIS — I10 Essential (primary) hypertension: Secondary | ICD-10-CM

## 2016-12-07 DIAGNOSIS — Z951 Presence of aortocoronary bypass graft: Secondary | ICD-10-CM | POA: Diagnosis not present

## 2016-12-07 DIAGNOSIS — I480 Paroxysmal atrial fibrillation: Secondary | ICD-10-CM

## 2016-12-07 NOTE — Progress Notes (Signed)
OFFICE NOTE  Chief Complaint:  Hospital follow-up  Primary Care Physician: Derrick Peng, NP  HPI:  [name] is a 56 y.o. male with past medical history of CAD (s/p NSTEMI in 05/2016 with cath showing severe 3 vessel CAD), ischemic cardiomyopathy (EF 25-35% by echo in 05/2014), PAF (previously on Eliquis), Type 2 DM, HTN, HLD, COPD and prior tobacco use who presents today for hospital follow-up following recent CABG. Had previously been admitted from 8/23 - 06/19/2016 for I&D of a left axillary abscess. The day following the procedure, he developed an NSTEMI with his EKG showing new inferior Q-waves and lateral ST depression with a peak troponin of 16.06. Echo with reduced EF of 25-35% and akinesis of the basal-midinferolateral and inferior myocardium. Cardiac cath on 8/29 showed severe multivessel CAD with diffuse narrowing of the LAD with 60-70% proximal stenoses, total occlusion of a high marginal/ramus intermediate vessel with distal retrograde filling, 85% LCx stenosis, and 50% stenoses in a small nondominant RCA. CABG was recommended. He was not thought to be a current CABG candidate with his active MRSA wound along his axilla. Was seen in the office by Dr. Prescott Gum on 10/25 and CABG was scheduled for 08/24/2016. This was performed with LIMA-LAD, SVG-D1, and SVG-RI with clipping of the atrial appendage. He required pressor support following surgery with Dopamine, Milrinone, and Norepinephrine. Developed transient episodes of PAF and was started on IV Amiodarone. Pressor support was weaned throughout the hospitalization and IV Amiodarone was switched to PO Amiodarone 415m daily with this being reduced to 2063mdaily prior to discharge due to his QTc being > 500 ms. Was started on Coumadin for his atrial fibrillation. Was also continued on ASA 8174maily, Atorvastatin 50m42mily, Digoxin 0.125mg50mly, Lasix 40mg 41my, and Lopressor 12.5mg BI3mWas not started on an ACE-I/ARB secondary to  hypotension and renal insufficiency. Weight at time of discharge was 158 lbs with creatinine of 1.35. Hgb stable at 9.0.  12/07/2016  [name] today for follow-up. Since discharge he is feeling much better. He underwent a repeat echocardiogram which showed improved LVEF up to 45%. He's had no recurrent atrial fibrillation. Unfortunately suffered significant side effects from amiodarone including tremors for which she is currently seeing a neurologist. During his surgery he underwent left atrial appendage closure and has completed 3 months of warfarin therapy. EKG today shows sinus rhythm.  PMHx:  Past Medical History:  Diagnosis Date  . Abrasion L FOREARM  . Atrial fibrillation (HCC)   [place] initially occurring in post-op setting in 05/2016, started on Eliquis b. 08/2016: s/p clipping of atrial appendage at time of CABG --> Eliquis switched to Coumadin  . CAD (coronary artery disease)    a. 05/2016: NSTEMI, cath showing 3v disease with CABG recommended once MRSA infection resolved. b. 08/2016: CABG with LIMA-LAD, SVG-D1, and SVG-RI.  . [place] Kitchen (HCC)   Eatonsal cell carcinoma of the skin  . Diabetes mellitus without complication (HCC)   [place]duc[place]ache(784.0)   . Hematuria, microscopic "SINCE I WAS A KID"  . Hyperlipidemia   . Hypertension   . Ischemic cardiomyopathy    a. 05/2016: echo w/ EF of 25-35% and akinesis of the basal-midinferolateral and inferior myocardium.  . Lactose intolerance   . Myocardial infarction   . Respiratory failure (HCC)   Cambridgeotator cuff tear RIGHT  . Tobacco use    a. quit in 05/2016 following MI    Past Surgical History:  Procedure Laterality Date  .  CARDIAC CATHETERIZATION N/A 06/16/2016   Procedure: Left Heart Cath and Coronary Angiography;  Surgeon: Troy Sine, MD;  Location: [facility];  Service: Cardiovascular;  Laterality: N/A;  . CLIPPING OF ATRIAL APPENDAGE N/A 08/24/2016   Procedure: CLIPPING OF ATRIAL APPENDAGE;  Surgeon: Ivin Poot, MD;  Location: [facility];  Service: Open Heart Surgery;  Laterality: N/A;  . CORONARY ARTERY BYPASS GRAFT N/A 08/24/2016   Procedure: CORONARY ARTERY BYPASS GRAFTING (CABG) x three,  using left internal mammary artery and right leg greater saphenous vein harvested endoscopically;  Surgeon: Ivin Poot, MD;  Location: [facility];  Service: Open Heart Surgery;  Laterality: N/A;  . HERNIA REPAIR  X2  . INCISION AND DRAINAGE ABSCESS Left 06/10/2016   Procedure: INCISION AND DRAINAGE LEFT AXILLARY ABSCESS;  Surgeon: Autumn Messing III, MD;  Location: [facility];  Service: General;  Laterality: Left;  . ROTATOR CUFF REPAIR Bilateral   . TEE WITHOUT CARDIOVERSION N/A 08/24/2016   Procedure: TRANSESOPHAGEAL ECHOCARDIOGRAM (TEE);  Surgeon: Ivin Poot, MD;  Location: [facility];  Service: Open Heart Surgery;  Laterality: N/A;    FAMHx:  Family History  Problem Relation Age of Onset  . Hypertension Father   . Sudden death Father   . Diabetes Father   . Hypertension Mother   . Heart attack Sister     SOCHx:   reports that he has been smoking Cigarettes.  He has never used smokeless tobacco. He reports that he does not drink alcohol or use drugs.  ALLERGIES:  Allergies  Allergen Reactions  . Latex Rash    ROS: Pertinent items noted in HPI and remainder of comprehensive ROS otherwise negative.  HOME MEDS: Current Outpatient Prescriptions on File Prior to Visit  Medication Sig Dispense Refill  . aspirin 81 MG chewable tablet Chew 1 tablet (81 mg total) by mouth daily. 30 tablet 3  . atorvastatin (LIPITOR) 80 MG tablet Take 1 tablet (80 mg total) by mouth at bedtime. 90 tablet 3  . blood glucose meter kit and supplies KIT Dispense based on patient and insurance preference. Use up to four times daily as directed. (FOR ICD-9 250.00, 250.01). 1 each 0  . glipiZIDE (GLUCOTROL) 10 MG tablet Take 1 tablet (10 mg total) by mouth 2 (two) times daily before a meal. 180 tablet 1  . hydrOXYzine (ATARAX/VISTARIL)  10 MG tablet Take 1 tablet (10 mg total) by mouth 3 (three) times daily as needed. 30 tablet 0  . metFORMIN (GLUCOPHAGE) 1000 MG tablet Take 1 tablet (1,000 mg total) by mouth 2 (two) times daily with a meal. 180 tablet 3  . metoprolol tartrate (LOPRESSOR) 25 MG tablet Take 1 tablet (25 mg total) by mouth 2 (two) times daily. 60 tablet 11  . ONE TOUCH ULTRA TEST test strip USE UP TO 4 TIMES DAILY AS DIRECTED  2  . ONETOUCH DELICA LANCETS 32G MISC USE UP TO 4 TIMES DAILY AS DIRECTED  2   No current facility-administered medications on file prior to visit.     LABS/IMAGING: No results found for this or any previous visit (from the past 48 hour(s)). No results found.  WEIGHTS: Wt Readings from Last 3 Encounters:  12/07/16 176 lb (79.8 kg)  11/23/16 175 lb (79.4 kg)  11/18/16 170 lb (77.1 kg)    VITALS: BP (!) 150/84   Pulse 82   Ht '5\' 5"'  (1.651 m)   Wt 176 lb (79.8 kg)   BMI 29.29 kg/m  EXAM: General appearance: alert and no distress Lungs: clear to auscultation bilaterally Heart: regular rate and rhythm, S1, S2 normal, no murmur, click, rub or gallop Extremities: extremities normal, atraumatic, no cyanosis or edema Neurologic: Mental status: Alert, oriented, thought content appropriate, No rest tremor  EKG: Normal sinus rhythm at 82, possible left atrial enlargement, inferior Q waves  ASSESSMENT: 1. CAD status post three-vessel CABG (LIMA-LAD, SVG-D1, and SVG-RI) 2. Left atrial appendage closure 3. PAF 4. Tremor related to amiodarone 5. Ischemic cardiomyopathy, EF now 45% (11/2016)  PLAN: 1.   Mr. Balis has recovered from multivessel bypass and left atrial appendage closure. He had recurrent PAF postoperatively and is on amiodarone and warfarin. Unfortunately developed significant tremors on amiodarone. This is been weaned down to 200 mg daily and I would recommend discontinuing it today. He is now 3 months post bypass with closure of the left atrial appendage. Based on  this he would have close to 0 risk of left atrial appendage thrombus formation and stroke therefore warfarin can be discontinued.  Although BP was elevated today, recheck was 130/80 and he reports blood pressure at times is borderline low at home. I do not believe there is room to add additional medication such as ACE inhibitor or ARB although this would be indicated for his cardiomyopathy. We'll readdress this at follow-up.  Pixie Casino, MD, [facility] Attending Cardiologist [place] 12/07/2016, 3:14 PM

## 2016-12-07 NOTE — Patient Instructions (Addendum)
Your physician has recommended you make the following change in your medication: -- STOP amiodarone -- STOP coumadin   Your physician wants you to follow-up in: [place] with Dr. Debara Pickett. You will receive a reminder letter in the mail two months in advance. If you don't receive a letter, please call our office to schedule the follow-up appointment.

## 2016-12-11 ENCOUNTER — Telehealth: Payer: Self-pay

## 2016-12-11 NOTE — Telephone Encounter (Signed)
LMOM

## 2016-12-15 ENCOUNTER — Telehealth: Payer: Self-pay | Admitting: Neurology

## 2016-12-15 NOTE — Telephone Encounter (Signed)
Lets give it AT LEAST 2 months off of amiodarone.  Should theoretically be 6 months, but only been 8 days!  Make yourself a reminder for 2 months and call him.

## 2016-12-15 NOTE — Telephone Encounter (Signed)
Patient's daughter made aware.  They have appt at the beginning of April and will keep that appt to talk about medication at that time.

## 2016-12-15 NOTE — Telephone Encounter (Signed)
[name] called in regards to PT and was asking about a medication for his tremors/Dawn CB# [phone]

## 2016-12-15 NOTE — Telephone Encounter (Signed)
Patient stopped Amiodarone on 12/07/16. Did you want to wait til patient is off 6 months before starting drug?

## 2016-12-16 ENCOUNTER — Encounter: Payer: Self-pay | Admitting: *Deleted

## 2016-12-16 DIAGNOSIS — Z951 Presence of aortocoronary bypass graft: Secondary | ICD-10-CM

## 2016-12-16 NOTE — Progress Notes (Signed)
Cardiac Individual Treatment Plan  Patient Details  Name: [name] MRN: [id number] Date of Birth: Dec 24, 1960 Referring Provider:   Flowsheet Row Cardiac Rehab from 10/26/2016 in [facility] Cardiac and Pulmonary Rehab  Referring Provider  Lyman Bishop MD      Initial Encounter Date:  Flowsheet Row Cardiac Rehab from 10/26/2016 in [facility] Cardiac and Pulmonary Rehab  Date  10/26/16  Referring Provider  Lyman Bishop MD      Visit Diagnosis: S/P CABG x 3  Patient's Home Medications on Admission:  Current Outpatient Prescriptions:  .  aspirin 81 MG chewable tablet, Chew 1 tablet (81 mg total) by mouth daily., Disp: 30 tablet, Rfl: 3 .  atorvastatin (LIPITOR) 80 MG tablet, Take 1 tablet (80 mg total) by mouth at bedtime., Disp: 90 tablet, Rfl: 3 .  blood glucose meter kit and supplies KIT, Dispense based on patient and insurance preference. Use up to four times daily as directed. (FOR ICD-9 250.00, 250.01)., Disp: 1 each, Rfl: 0 .  glipiZIDE (GLUCOTROL) 10 MG tablet, Take 1 tablet (10 mg total) by mouth 2 (two) times daily before a meal., Disp: 180 tablet, Rfl: 1 .  hydrOXYzine (ATARAX/VISTARIL) 10 MG tablet, Take 1 tablet (10 mg total) by mouth 3 (three) times daily as needed., Disp: 30 tablet, Rfl: 0 .  metFORMIN (GLUCOPHAGE) 1000 MG tablet, Take 1 tablet (1,000 mg total) by mouth 2 (two) times daily with a meal., Disp: 180 tablet, Rfl: 3 .  metoprolol tartrate (LOPRESSOR) 25 MG tablet, Take 1 tablet (25 mg total) by mouth 2 (two) times daily., Disp: 60 tablet, Rfl: 11 .  ONE TOUCH ULTRA TEST test strip, USE UP TO 4 TIMES DAILY AS DIRECTED, Disp: , Rfl: 2 .  ONETOUCH DELICA LANCETS 18A MISC, USE UP TO 4 TIMES DAILY AS DIRECTED, Disp: , Rfl: 2  Past Medical History: Past Medical History:  Diagnosis Date  . Abrasion L FOREARM  . Atrial fibrillation ([place])    a. initially occurring in post-op setting in 05/2016, started on Eliquis b. 08/2016: s/p clipping of atrial appendage at time of CABG  --> Eliquis switched to Coumadin  . CAD (coronary artery disease)    a. 05/2016: NSTEMI, cath showing 3v disease with CABG recommended once MRSA infection resolved. b. 08/2016: CABG with LIMA-LAD, SVG-D1, and SVG-RI.  Marland Kitchen Cancer ([place])    basal cell carcinoma of the skin  . Diabetes mellitus without complication ([place])   . Headache(784.0)   . Hematuria, microscopic "SINCE I WAS A KID"  . Hyperlipidemia   . Hypertension   . Ischemic cardiomyopathy    a. 05/2016: echo w/ EF of 25-35% and akinesis of the basal-midinferolateral and inferior myocardium.  . Lactose intolerance   . Myocardial infarction   . Respiratory failure ([place])   . Rotator cuff tear RIGHT  . Tobacco use    a. quit in 05/2016 following MI    Tobacco Use: History  Smoking Status  . Current Some Day Smoker  . Types: Cigarettes  Smokeless Tobacco  . Never Used    Comment: last use 07/2016    Labs: Recent Review Flowsheet Data    Labs for ITP Cardiac and Pulmonary Rehab Latest Ref Rng & Units 08/29/2016 08/30/2016 08/31/2016 09/22/2016 11/17/2016   Cholestrol 0 - 200 mg/dL - - - - -   LDLCALC 0 - 99 mg/dL - - - - -   LDLDIRECT mg/dL - - - - -   HDL >39.00 mg/dL - - - - -  Trlycerides 0.0 - 149.0 mg/dL - - - - -   Hemoglobin A1c - - - - 7.2 8.6   PHART 7.350 - 7.450 - - - - -   PCO2ART 32.0 - 48.0 mmHg - - - - -   HCO3 20.0 - 28.0 mmol/L - - - - -   TCO2 0 - 100 mmol/L - - - - -   ACIDBASEDEF 0.0 - 2.0 mmol/L - - - - -   O2SAT % 45.3 49.8 50.9 - -       Exercise Target Goals:    Exercise Program Goal: Individual exercise prescription set with THRR, safety & activity barriers. Participant demonstrates ability to understand and report RPE using BORG scale, to self-measure pulse accurately, and to acknowledge the importance of the exercise prescription.  Exercise Prescription Goal: Starting with aerobic activity 30 plus minutes a day, 3 days per week for initial exercise prescription. Provide home exercise  prescription and guidelines that participant acknowledges understanding prior to discharge.  Activity Barriers & Risk Stratification:     Activity Barriers & Cardiac Risk Stratification - 10/26/16 1115      Activity Barriers & Cardiac Risk Stratification   Activity Barriers Deconditioning;Muscular Weakness;Other (comment)   Comments Hx Right Shoulder Rotator Cuff repair   Cardiac Risk Stratification High      6 Minute Walk:     6 Minute Walk    Row Name 10/26/16 1449         6 Minute Walk   Phase Initial     Distance 1460 feet     Walk Time 6 minutes     # of Rest Breaks 0     MPH 2.77     METS 4.04     RPE 12     VO2 Peak 14.14     Symptoms No     Resting HR 77 bpm     Resting BP 128/74     Max Ex. HR 110 bpm     Max Ex. BP 126/54     2 Minute Post BP 114/56        Oxygen Initial Assessment:   Oxygen Re-Evaluation:   Oxygen Discharge (Final Oxygen Re-Evaluation):   Initial Exercise Prescription:     Initial Exercise Prescription - 10/26/16 1400      Date of Initial Exercise RX and Referring Provider   Date 10/26/16   Referring Provider Lyman Bishop MD     Treadmill   MPH 2.5   Grade 2   Minutes 15   METs 3.6     REL-XR   Level 3   Watts --  speed >50   Minutes 15   METs 3     T5 Nustep   Level 3   SPM --  80-100 spm   Minutes 15   METs 3     Prescription Details   Frequency (times per week) 3   Duration Progress to 45 minutes of aerobic exercise without signs/symptoms of physical distress     Intensity   THRR 40-80% of Max Heartrate 112-147   Ratings of Perceived Exertion 11-15   Perceived Dyspnea 0-4     Progression   Progression Continue to progress workloads to maintain intensity without signs/symptoms of physical distress.     Resistance Training   Training Prescription Yes   Weight 3 lbs   Reps 10-15      Perform Capillary Blood Glucose checks as needed.  Exercise Prescription Changes:  Exercise  Prescription Changes    Row Name 10/26/16 1400 10/29/16 1100 11/13/16 1200         Response to Exercise   Blood Pressure (Admit) 128/74 114/68 128/70     Blood Pressure (Exercise) 126/54 124/68 136/66     Blood Pressure (Exit) 114/56 122/78 102/62     Heart Rate (Admit) 77 bpm 79 bpm 87 bpm     Heart Rate (Exercise) 110 bpm 94 bpm 102 bpm     Heart Rate (Exit) 84 bpm 83 bpm 86 bpm     Oxygen Saturation (Admit) 100 %  -  -     Oxygen Saturation (Exercise) 99 %  -  -     Rating of Perceived Exertion (Exercise) _0 Symptoms none none none     Duration  - Progress to 45 minutes of aerobic exercise without signs/symptoms of physical distress Progress to 45 minutes of aerobic exercise without signs/symptoms of physical distress     Intensity  - THRR unchanged THRR unchanged       Progression   Progression  - Continue to progress workloads to maintain intensity without signs/symptoms of physical distress. Continue to progress workloads to maintain intensity without signs/symptoms of physical distress.     Average METs  - 2.75 2.85       Resistance Training   Training Prescription  - Yes Yes     Weight  - 3 3     Reps  - 10-15 10-15       Interval Training   Interval Training  - No No       Treadmill   MPH  -  - 2.5     Grade  -  - 2     Minutes  -  - 15     METs  -  - 3.6       REL-XR   Level  - 3  -     Minutes  - 15  -     METs  - 3.3  -       T5 Nustep   Level  - 3 3     Minutes  - 15 15     METs  - 2.2 2.1       Exercise Review   Progression -  walk test results  -  -        Exercise Comments:     Exercise Comments    Row Name 10/26/16 1453 10/29/16 1151 11/13/16 1209       Exercise Comments [name] wants to increase his strength, stamina, and endurance.  He would like to be able to get back to work and life being stable. [name] did well his first full day of exercise. [name] has tolerated exercise very well in his first two weeks.  Staff will encourage  him to attain his Target HR range as he has been below.        Exercise Goals and Review:   Exercise Goals Re-Evaluation :   Discharge Exercise Prescription (Final Exercise Prescription Changes):     Exercise Prescription Changes - 11/13/16 1200      Response to Exercise   Blood Pressure (Admit) 128/70   Blood Pressure (Exercise) 136/66   Blood Pressure (Exit) 102/62   Heart Rate (Admit) 87 bpm   Heart Rate (Exercise) 102 bpm   Heart Rate (Exit) 86 bpm   Rating of Perceived Exertion (Exercise) 13   Symptoms  none   Duration Progress to 45 minutes of aerobic exercise without signs/symptoms of physical distress   Intensity THRR unchanged     Progression   Progression Continue to progress workloads to maintain intensity without signs/symptoms of physical distress.   Average METs 2.85     Resistance Training   Training Prescription Yes   Weight 3   Reps 10-15     Interval Training   Interval Training No     Treadmill   MPH 2.5   Grade 2   Minutes 15   METs 3.6     T5 Nustep   Level 3   Minutes 15   METs 2.1      Nutrition:  Target Goals: Understanding of nutrition guidelines, daily intake of sodium <1585m, cholesterol <2013m calories 30% from fat and 7% or less from saturated fats, daily to have 5 or more servings of fruits and vegetables.  Biometrics:     Pre Biometrics - 10/26/16 1453      Pre Biometrics   Height 5' 5.5" (1.664 m)   Weight 165 lb 12.8 oz (75.2 kg)   Waist Circumference 35.5 inches   Hip Circumference 37.5 inches   Waist to Hip Ratio 0.95 %   BMI (Calculated) 27.2   Single Leg Stand 1.93 seconds       Nutrition Therapy Plan and Nutrition Goals:     Nutrition Therapy & Goals - 10/26/16 1228      Nutrition Therapy   Drug/Food Interactions Statins/Certain Fruits;Coumadin/Vit K     Intervention Plan   Intervention Prescribe, educate and counsel regarding individualized specific dietary modifications aiming towards targeted  core components such as weight, hypertension, lipid management, diabetes, heart failure and other comorbidities.   Expected Outcomes Short Term Goal: Understand basic principles of dietary content, such as calories, fat, sodium, cholesterol and nutrients.;Short Term Goal: A plan has been developed with personal nutrition goals set during dietitian appointment.;Long Term Goal: Adherence to prescribed nutrition plan.      Nutrition Discharge: Rate Your [name] Scores:     Nutrition Assessments - 10/26/16 1342      MEDFICTS Scores   Pre Score 78      Nutrition Goals Re-Evaluation:   Nutrition Goals Discharge (Final Nutrition Goals Re-Evaluation):   Psychosocial: Target Goals: Acknowledge presence or absence of significant depression and/or stress, maximize coping skills, provide positive support system. Participant is able to verbalize types and ability to use techniques and skills needed for reducing stress and depression.   Initial Review & Psychosocial Screening:     Initial Psych Review & Screening - 10/26/16 1232      Initial Review   Current issues with Current Depression;Current Stress Concerns     Family Dynamics   Good Support System? Yes   Comments JaPrentices separated from his wife but his daughter was here today and was very supportive. JaRodgeraid he is a [place] lately has been depressed and usually sees the positive in life. Shaft's daughter emphasized and he added that this all started wtih a infected hair follicle while he was away for the weekend at the beach with his wife. The hair follicle infection grew and MD told him had to have surgery in the hospital-then due to his diabetes he had a heart attack. Delevlped MRSA infection then needed open heart surgery.       Quality of Life Scores:      Quality of Life - 10/26/16 1235      Quality  of Life Scores   Health/Function Pre 18.13 %   Socioeconomic Pre 19.86 %   Psych/Spiritual Pre 15.43 %   Family Pre  19.4 %   GLOBAL Pre 18.12 %      PHQ-9: Recent Review Flowsheet Data    Depression screen [facility] 2/9 10/26/2016   Decreased Interest 2   Down, Depressed, Hopeless 2   PHQ - 2 Score 4   Altered sleeping 1   Tired, decreased energy 1   Change in appetite 0   Feeling bad or failure about yourself  2   Trouble concentrating 0   Moving slowly or fidgety/restless 0   Suicidal thoughts 0   PHQ-9 Score 8   Difficult doing work/chores Somewhat difficult     Interpretation of Total Score  Total Score Depression Severity:  1-4 = Minimal depression, 5-9 = Mild depression, 10-14 = Moderate depression, 15-19 = Moderately severe depression, 20-27 = Severe depression   Psychosocial Evaluation and Intervention:     Psychosocial Evaluation - 11/11/16 1723      Psychosocial Evaluation & Interventions   Interventions Encouraged to exercise with the program and follow exercise prescription;Stress management education;Relaxation education   Comments Counselor met with [name] [name]) today for initial psychosocial evaluation.  He is a 56 year old who had open heart surgery in November.  He has a strong support system with (2) adult daughters and his mother who all live close by.  [name] has diabetes in addition to his heart problems.  He reports sleeping "terribly" with several nights each week not able to go to sleep until 6-7 AM.  He stated his Dr. suggested Melatonin but he has tried without success and wondered if he needed a stronger dose.  Counselor encouraged him to consider this, since the Dr. recommended and to check with his pharmacist or Dr. Donivan Scull: dosage.  [name] has a good appetite currently.  He denies a history of depression or anxiety, but admits to current symptoms since the open heart surgery.  He reported he and his spouse separated before the surgery and his sister passed away last year.  He has also been experiencing tremors in his hands since the surgery and is scheduled to see a  Neurosurgeon early February to evaluate this further.  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  He is not sure how he is going to be able to come to this class and go back to work with that type of schedule.  His goals for this program are to feel better and get over his depressive symptoms.  Counselor provided information on several psychiatrists since he states he is on something; but wonders if it might be causing the tremors.  Counselor will follow with [name] over the course of this program.    Continue Psychosocial Services  Yes  Follow with [name] re: depressive symptoms and sleep.  Gave info on psychiatrists as well.        Psychosocial Re-Evaluation:   Psychosocial Discharge (Final Psychosocial Re-Evaluation):   Vocational Rehabilitation: Provide vocational rehab assistance to qualifying candidates.   Vocational Rehab Evaluation & Intervention:     Vocational Rehab - 10/26/16 1114      Initial Vocational Rehab Evaluation & Intervention   Assessment shows need for Vocational Rehabilitation No      Education: Education Goals: Education classes will be provided on a weekly  basis, covering required topics. Participant will state understanding/return demonstration of topics presented.  Learning Barriers/Preferences:     Learning Barriers/Preferences - 10/26/16 1115      Learning Barriers/Preferences   Learning Barriers None   Learning Preferences Individual Instruction      Education Topics: General Nutrition Guidelines/Fats and Fiber: -Group instruction provided by verbal, written material, models and posters to present the general guidelines for heart healthy nutrition. Gives an explanation and review of dietary fats and fiber.   Controlling Sodium/Reading Food Labels: -Group verbal and written material supporting the discussion of sodium use in heart  healthy nutrition. Review and explanation with models, verbal and written materials for utilization of the food label. Flowsheet Row Cardiac Rehab from 11/11/2016 in [facility] Cardiac and Pulmonary Rehab  Date  11/02/16  Educator  PI  Instruction Review Code  2- meets goals/outcomes      Exercise Physiology & Risk Factors: - Group verbal and written instruction with models to review the exercise physiology of the cardiovascular system and associated critical values. Details cardiovascular disease risk factors and the goals associated with each risk factor.   Aerobic Exercise & Resistance Training: - Gives group verbal and written discussion on the health impact of inactivity. On the components of aerobic and resistive training programs and the benefits of this training and how to safely progress through these programs. Flowsheet Row Cardiac Rehab from 11/11/2016 in [facility] Cardiac and Pulmonary Rehab  Date  11/11/16  Educator  AS  Instruction Review Code  2- meets goals/outcomes      Flexibility, Balance, General Exercise Guidelines: - Provides group verbal and written instruction on the benefits of flexibility and balance training programs. Provides general exercise guidelines with specific guidelines to those with heart or lung disease. Demonstration and skill practice provided.   Stress Management: - Provides group verbal and written instruction about the health risks of elevated stress, cause of high stress, and healthy ways to reduce stress.   Depression: - Provides group verbal and written instruction on the correlation between heart/lung disease and depressed mood, treatment options, and the stigmas associated with seeking treatment. Flowsheet Row Cardiac Rehab from 11/11/2016 in [facility] Cardiac and Pulmonary Rehab  Date  10/28/16  Educator  Lucianne Lei, MSW  Instruction Review Code  2- meets goals/outcomes      Anatomy & Physiology of the Heart: - Group verbal and written  instruction and models provide basic cardiac anatomy and physiology, with the coronary electrical and arterial systems. Review of: AMI, Angina, Valve disease, Heart Failure, Cardiac Arrhythmia, Pacemakers, and the ICD.   Cardiac Procedures: - Group verbal and written instruction and models to describe the testing methods done to diagnose heart disease. Reviews the outcomes of the test results. Describes the treatment choices: Medical Management, Angioplasty, or Coronary Bypass Surgery.   Cardiac Medications: - Group verbal and written instruction to review commonly prescribed medications for heart disease. Reviews the medication, class of the drug, and side effects. Includes the steps to properly store meds and maintain the prescription regimen.   Go Sex-Intimacy & Heart Disease, Get SMART - Goal Setting: - Group verbal and written instruction through game format to discuss heart disease and the return to sexual intimacy. Provides group verbal and written material to discuss and apply goal setting through the application of the S.M.A.R.T. Method.   Other Matters of the Heart: - Provides group verbal, written materials and models to describe Heart Failure, Angina, Valve Disease, and Diabetes in the realm of heart  disease. Includes description of the disease process and treatment options available to the cardiac patient.   Exercise & Equipment Safety: - Individual verbal instruction and demonstration of equipment use and safety with use of the equipment. Flowsheet Row Cardiac Rehab from 11/11/2016 in [facility] Cardiac and Pulmonary Rehab  Date  10/28/16  Educator  C. EnterkinRN  Instruction Review Code  1- partially meets, needs review/practice      Infection Prevention: - Provides verbal and written material to individual with discussion of infection control including proper hand washing and proper equipment cleaning during exercise session. Flowsheet Row Cardiac Rehab from 11/11/2016 in [facility]  Cardiac and Pulmonary Rehab  Date  10/26/16  Educator  C. EnterkinRN  Instruction Review Code  2- meets goals/outcomes      Falls Prevention: - Provides verbal and written material to individual with discussion of falls prevention and safety. Flowsheet Row Cardiac Rehab from 11/11/2016 in [facility] Cardiac and Pulmonary Rehab  Date  10/26/16  Educator  C. [place]  Instruction Review Code  2- meets goals/outcomes      Diabetes: - Individual verbal and written instruction to review signs/symptoms of diabetes, desired ranges of glucose level fasting, after meals and with exercise. Advice that pre and post exercise glucose checks will be done for 3 sessions at entry of program. [place] from 11/11/2016 in [facility] Cardiac and Pulmonary Rehab  Date  10/26/16  Educator  C. [place]  Instruction Review Code  1- partially meets, needs review/practice       Knowledge Questionnaire Score:     Knowledge Questionnaire Score - 10/26/16 1114      Knowledge Questionnaire Score   Pre Score 23      Core Components/Risk Factors/Patient Goals at Admission:     Personal Goals and Risk Factors at Admission - 10/26/16 1234      Core Components/Risk Factors/Patient Goals on Admission    Weight Management Yes;Weight Maintenance   Intervention Weight Management: Develop a combined nutrition and exercise program designed to reach desired caloric intake, while maintaining appropriate intake of nutrient and fiber, sodium and fats, and appropriate energy expenditure required for the weight goal.;Weight Management: Provide education and appropriate resources to help participant work on and attain dietary goals.   Admit Weight 165 lb 12.8 oz (75.2 kg)   Goal Weight: Short Term 165 lb (74.8 kg)   Goal Weight: Long Term 162 lb (73.5 kg)   Expected Outcomes Long Term: Adherence to nutrition and physical activity/exercise program aimed toward attainment of established weight goal;Short Term:  Continue to assess and modify interventions until short term weight is achieved;Weight Maintenance: Understanding of the daily nutrition guidelines, which includes 25-35% calories from fat, 7% or less cal from saturated fats, less than '200mg'$  cholesterol, less than 1.5gm of sodium, & 5 or more servings of fruits and vegetables daily   Increase Strength and Stamina Yes   Intervention Provide advice, education, support and counseling about physical activity/exercise needs.;Develop an individualized exercise prescription for aerobic and resistive training based on initial evaluation findings, risk stratification, comorbidities and participant's personal goals.   Expected Outcomes Achievement of increased cardiorespiratory fitness and enhanced flexibility, muscular endurance and strength shown through measurements of functional capacity and personal statement of participant.   Tobacco Cessation Yes   Intervention Assist the participant in steps to quit. Provide individualized education and counseling about committing to Tobacco Cessation, relapse prevention, and pharmacological support that can be provided by physician.;Advice worker, assist with locating and accessing local/national  Quit Smoking programs, and support quit date choice.   Expected Outcomes Long Term: Complete abstinence from all tobacco products for at least 12 months from quit date.   Diabetes Yes   Intervention Provide education about signs/symptoms and action to take for hypo/hyperglycemia.;Provide education about proper nutrition, including hydration, and aerobic/resistive exercise prescription along with prescribed medications to achieve blood glucose in normal ranges: Fasting glucose 65-99 mg/dL   Expected Outcomes Short Term: Participant verbalizes understanding of the signs/symptoms and immediate care of hyper/hypoglycemia, proper foot care and importance of medication, aerobic/resistive exercise and nutrition plan for  blood glucose control.;Long Term: Attainment of HbA1C < 7%.   Hypertension Yes   Intervention Provide education on lifestyle modifcations including regular physical activity/exercise, weight management, moderate sodium restriction and increased consumption of fresh fruit, vegetables, and low fat dairy, alcohol moderation, and smoking cessation.;Monitor prescription use compliance.   Expected Outcomes Short Term: Continued assessment and intervention until BP is < 140/18m HG in hypertensive participants. < 130/851mHG in hypertensive participants with diabetes, heart failure or chronic kidney disease.;Long Term: Maintenance of blood pressure at goal levels.   Lipids Yes   Intervention Provide education and support for participant on nutrition & aerobic/resistive exercise along with prescribed medications to achieve LDL <704mHDL >39m76m Expected Outcomes Short Term: Participant states understanding of desired cholesterol values and is compliant with medications prescribed. Participant is following exercise prescription and nutrition guidelines.;Long Term: Cholesterol controlled with medications as prescribed, with individualized exercise RX and with personalized nutrition plan. Value goals: LDL < 70mg50mL > 40 mg.   Stress Yes   Intervention Offer individual and/or small group education and counseling on adjustment to heart disease, stress management and health-related lifestyle change. Teach and support self-help strategies.;Refer participants experiencing significant psychosocial distress to appropriate mental health specialists for further evaluation and treatment. When possible, include family members and significant others in education/counseling sessions.   Expected Outcomes Short Term: Participant demonstrates changes in health-related behavior, relaxation and other stress management skills, ability to obtain effective social support, and compliance with psychotropic medications if  prescribed.;Long Term: Emotional wellbeing is indicated by absence of clinically significant psychosocial distress or social isolation.   Personal Goal Other Yes   Personal Goal --  [name] from his wife but his daughter was here today and was very supportive. [name] he is a [employer] has been depressed and usually sees the positive in life. [name]'s daughter emphasized and he added that this all started wti   Intervention [place] [facility] to meet with Mental Health Counselor on staff.   Expected Outcomes To discuss with his doctor his limitations after his CABG 09/02/2016 but hopefully be able to get his stamina back for work.       Core Components/Risk Factors/Patient Goals Review:    Core Components/Risk Factors/Patient Goals at Discharge (Final Review):    ITP Comments:     ITP Comments    Row Name 10/26/16 1227 11/18/16 0654 11/27/16 1228 12/16/16 0646     ITP Comments ITP created during Medical review today. Documentation of diagnosis [facility] Admission 08/24/2016 CABG x3 30 day review. Continue with ITP unless directed changes per Medical Director review. New to program  I left vm to check on him since he has not been in [facility]. Noticed in chart he has been to Movement Disorder MD since his essential tremor of 30 years he reports got worse after his MI.  30 day review. Continue with ITP unless directed changes per Medical Director review       Comments:

## 2016-12-17 ENCOUNTER — Encounter: Payer: BLUE CROSS/BLUE SHIELD | Attending: Internal Medicine

## 2016-12-17 DIAGNOSIS — Z951 Presence of aortocoronary bypass graft: Secondary | ICD-10-CM | POA: Insufficient documentation

## 2016-12-17 DIAGNOSIS — I2510 Atherosclerotic heart disease of native coronary artery without angina pectoris: Secondary | ICD-10-CM | POA: Insufficient documentation

## 2016-12-22 ENCOUNTER — Encounter: Payer: Self-pay | Admitting: Adult Health

## 2016-12-22 ENCOUNTER — Ambulatory Visit (INDEPENDENT_AMBULATORY_CARE_PROVIDER_SITE_OTHER): Payer: BLUE CROSS/BLUE SHIELD | Admitting: Adult Health

## 2016-12-22 VITALS — BP 140/78 | Temp 97.7°F | Ht 65.0 in | Wt 176.4 lb

## 2016-12-22 DIAGNOSIS — E1165 Type 2 diabetes mellitus with hyperglycemia: Secondary | ICD-10-CM | POA: Diagnosis not present

## 2016-12-22 DIAGNOSIS — F17200 Nicotine dependence, unspecified, uncomplicated: Secondary | ICD-10-CM | POA: Diagnosis not present

## 2016-12-22 DIAGNOSIS — IMO0001 Reserved for inherently not codable concepts without codable children: Secondary | ICD-10-CM

## 2016-12-22 LAB — POCT GLYCOSYLATED HEMOGLOBIN (HGB A1C): Hemoglobin A1C: 8.6

## 2016-12-22 MED ORDER — CANAGLIFLOZIN 100 MG PO TABS: 100.0000 mg | ORAL_TABLET | Freq: Every day | ORAL | 3 refills | Status: DC

## 2016-12-22 NOTE — Patient Instructions (Signed)
Your A1c is 8.6   I have discontinued Metformin and added a medication called Invokana.   I am afraid if you continue to smoke and not follow a diabetic diet then you will be right back to where you were before the heart attack.   Please follow up with me in 3 months

## 2016-12-22 NOTE — Progress Notes (Signed)
Subjective:    Patient ID: [name], male    DOB: [birth year], 56 y.o.   MRN: [id number]  HPI  56 year old male who presents to the office today for follow up regarding diabetes and tremor. Since I last saw him he was taken off Amiodarone, which he reports has started to subside. He is going to follow up with Neurology in April. Unfortunately, he continues to smoke as he feels as though this helps him stop shaking.   His blood sugars at home have been in the 240's. His last A1c was 8.6. He reports having diarrhea more often than not throughout the month. He is also not following a diabetic diet consistently. He reports " my appetite is back, so I am eating my share plus others."     Review of Systems  Constitutional: Negative.   HENT: Negative.   Eyes: Negative.   Respiratory: Negative.   Cardiovascular: Negative.   Gastrointestinal: Negative.   Endocrine: Negative.   Genitourinary: Negative.   Musculoskeletal: Negative.   Neurological: Positive for tremors.  All other systems reviewed and are negative.  Past Medical History:  Diagnosis Date  . Abrasion L FOREARM  . Atrial fibrillation ([place])    a. initially occurring in post-op setting in 05/2016, started on Eliquis b. 08/2016: s/p clipping of atrial appendage at time of CABG --> Eliquis switched to Coumadin  . CAD (coronary artery disease)    a. 05/2016: NSTEMI, cath showing 3v disease with CABG recommended once MRSA infection resolved. b. 08/2016: CABG with LIMA-LAD, SVG-D1, and SVG-RI.  Marland Kitchen Cancer ([place])    basal cell carcinoma of the skin  . Diabetes mellitus without complication ([place])   . Headache(784.0)   . Hematuria, microscopic "SINCE I WAS A KID"  . Hyperlipidemia   . Hypertension   . Ischemic cardiomyopathy    a. 05/2016: echo w/ EF of 25-35% and akinesis of the basal-midinferolateral and inferior myocardium.  . Lactose intolerance   . Myocardial infarction   . Respiratory failure ([place])   . Rotator cuff tear  RIGHT  . Tobacco use    a. quit in 05/2016 following MI    Social History   Social History  . Marital status: Married    Spouse name: N/A  . Number of children: N/A  . Years of education: N/A   Occupational History  . Magazine features editor   Social History Main Topics  . Smoking status: Current Some Day Smoker    Types: Cigarettes  . Smokeless tobacco: Never Used     Comment: last use 07/2016  . Alcohol use No  . Drug use: No  . Sexual activity: Yes   Other Topics Concern  . Not on file   Social History Narrative  . No narrative on file    Past Surgical History:  Procedure Laterality Date  . CARDIAC CATHETERIZATION N/A 06/16/2016   Procedure: Left Heart Cath and Coronary Angiography;  Surgeon: Troy Sine, MD;  Location: [facility];  Service: Cardiovascular;  Laterality: N/A;  . CLIPPING OF ATRIAL APPENDAGE N/A 08/24/2016   Procedure: CLIPPING OF ATRIAL APPENDAGE;  Surgeon: Ivin Poot, MD;  Location: [facility];  Service: Open Heart Surgery;  Laterality: N/A;  . CORONARY ARTERY BYPASS GRAFT N/A 08/24/2016   Procedure: CORONARY ARTERY BYPASS GRAFTING (CABG) x three,  using left internal mammary artery and right leg greater saphenous vein harvested endoscopically;  Surgeon: Ivin Poot, MD;  Location:  [facility];  Service: Open Heart Surgery;  Laterality: N/A;  . HERNIA REPAIR  X2  . INCISION AND DRAINAGE ABSCESS Left 06/10/2016   Procedure: INCISION AND DRAINAGE LEFT AXILLARY ABSCESS;  Surgeon: Autumn Messing III, MD;  Location: [facility];  Service: General;  Laterality: Left;  . ROTATOR CUFF REPAIR Bilateral   . TEE WITHOUT CARDIOVERSION N/A 08/24/2016   Procedure: TRANSESOPHAGEAL ECHOCARDIOGRAM (TEE);  Surgeon: Ivin Poot, MD;  Location: [facility];  Service: Open Heart Surgery;  Laterality: N/A;    Family History  Problem Relation Age of Onset  . Hypertension Father   . Sudden death Father   . Diabetes Father   . Hypertension Mother   . Heart attack  Sister     Allergies  Allergen Reactions  . Latex Rash    Current Outpatient Prescriptions on File Prior to Visit  Medication Sig Dispense Refill  . aspirin 81 MG chewable tablet Chew 1 tablet (81 mg total) by mouth daily. 30 tablet 3  . atorvastatin (LIPITOR) 80 MG tablet Take 1 tablet (80 mg total) by mouth at bedtime. 90 tablet 3  . blood glucose meter kit and supplies KIT Dispense based on patient and insurance preference. Use up to four times daily as directed. (FOR ICD-9 250.00, 250.01). 1 each 0  . glipiZIDE (GLUCOTROL) 10 MG tablet Take 1 tablet (10 mg total) by mouth 2 (two) times daily before a meal. 180 tablet 1  . hydrOXYzine (ATARAX/VISTARIL) 10 MG tablet Take 1 tablet (10 mg total) by mouth 3 (three) times daily as needed. 30 tablet 0  . metFORMIN (GLUCOPHAGE) 1000 MG tablet Take 1 tablet (1,000 mg total) by mouth 2 (two) times daily with a meal. 180 tablet 3  . metoprolol tartrate (LOPRESSOR) 25 MG tablet Take 1 tablet (25 mg total) by mouth 2 (two) times daily. 60 tablet 11  . ONE TOUCH ULTRA TEST test strip USE UP TO 4 TIMES DAILY AS DIRECTED  2  . ONETOUCH DELICA LANCETS 94W MISC USE UP TO 4 TIMES DAILY AS DIRECTED  2   No current facility-administered medications on file prior to visit.     BP 140/78   Temp 97.7 F (36.5 C) (Oral)   Ht _0  (1.651 m)   Wt 176 lb 6.4 oz (80 kg)   BMI 29.35 kg/m       Objective:   Physical Exam  Constitutional: He is oriented to person, place, and time. He appears well-developed and well-nourished. No distress.  Cardiovascular: Normal rate, regular rhythm, normal heart sounds and intact distal pulses.  Exam reveals no gallop and no friction rub.   No murmur heard. Pulmonary/Chest: Effort normal and breath sounds normal. No respiratory distress. He has no wheezes. He has no rales.  Neurological: He is alert and oriented to person, place, and time. He displays tremor. No cranial nerve deficit or sensory deficit. He exhibits  abnormal muscle tone. He displays a negative Romberg sign.  Mild tremor in left arm. 3/5 grip strength in left hand, 5/5 in right hand   Skin: Skin is warm and dry. No rash noted. No erythema. No pallor.  Psychiatric: He has a normal mood and affect. His behavior is normal. Judgment and thought content normal.  Nursing note and vitals reviewed.     Assessment & Plan:  1. Uncontrolled type 2 diabetes mellitus without complication, without long-term current use of insulin (HCC) - canagliflozin (INVOKANA) 100 MG TABS tablet; Take 1 tablet (100 mg total) by  mouth daily before breakfast.  Dispense: 30 tablet; Refill: 3 - POC HgB A1c- 8.6 - Needs to work on diet and exercise - Follow up in 3 months  - will increase Invokana at that time   2. Smoking - Needs to quit smoking  [name], NP

## 2016-12-24 ENCOUNTER — Telehealth: Payer: Self-pay

## 2016-12-24 NOTE — Telephone Encounter (Signed)
LMOM

## 2017-01-07 ENCOUNTER — Telehealth: Payer: Self-pay

## 2017-01-07 NOTE — Telephone Encounter (Signed)
LMOM

## 2017-01-13 ENCOUNTER — Encounter: Payer: Self-pay | Admitting: *Deleted

## 2017-01-13 DIAGNOSIS — Z951 Presence of aortocoronary bypass graft: Secondary | ICD-10-CM

## 2017-01-13 NOTE — Progress Notes (Signed)
Cardiac Individual Treatment Plan  Patient Details  Name: [name] MRN: [id number] Date of Birth: Jul 01, 1961 Referring Provider:     Cardiac Rehab from 10/26/2016 in [facility] Cardiac and Pulmonary Rehab  Referring Provider  Lyman Bishop MD      Initial Encounter Date:    Cardiac Rehab from 10/26/2016 in [facility] Cardiac and Pulmonary Rehab  Date  10/26/16  Referring Provider  Lyman Bishop MD      Visit Diagnosis: S/P CABG x 3  Patient's Home Medications on Admission:  Current Outpatient Prescriptions:  .  aspirin 81 MG chewable tablet, Chew 1 tablet (81 mg total) by mouth daily., Disp: 30 tablet, Rfl: 3 .  atorvastatin (LIPITOR) 80 MG tablet, Take 1 tablet (80 mg total) by mouth at bedtime., Disp: 90 tablet, Rfl: 3 .  blood glucose meter kit and supplies KIT, Dispense based on patient and insurance preference. Use up to four times daily as directed. (FOR ICD-9 250.00, 250.01)., Disp: 1 each, Rfl: 0 .  canagliflozin (INVOKANA) 100 MG TABS tablet, Take 1 tablet (100 mg total) by mouth daily before breakfast., Disp: 30 tablet, Rfl: 3 .  glipiZIDE (GLUCOTROL) 10 MG tablet, Take 1 tablet (10 mg total) by mouth 2 (two) times daily before a meal., Disp: 180 tablet, Rfl: 1 .  hydrOXYzine (ATARAX/VISTARIL) 10 MG tablet, Take 1 tablet (10 mg total) by mouth 3 (three) times daily as needed., Disp: 30 tablet, Rfl: 0 .  metoprolol tartrate (LOPRESSOR) 25 MG tablet, Take 1 tablet (25 mg total) by mouth 2 (two) times daily., Disp: 60 tablet, Rfl: 11 .  ONE TOUCH ULTRA TEST test strip, USE UP TO 4 TIMES DAILY AS DIRECTED, Disp: , Rfl: 2 .  ONETOUCH DELICA LANCETS 33A MISC, USE UP TO 4 TIMES DAILY AS DIRECTED, Disp: , Rfl: 2  Past Medical History: Past Medical History:  Diagnosis Date  . Abrasion L FOREARM  . Atrial fibrillation ([place])    a. initially occurring in post-op setting in 05/2016, started on Eliquis b. 08/2016: s/p clipping of atrial appendage at time of CABG --> Eliquis switched to  Coumadin  . CAD (coronary artery disease)    a. 05/2016: NSTEMI, cath showing 3v disease with CABG recommended once MRSA infection resolved. b. 08/2016: CABG with LIMA-LAD, SVG-D1, and SVG-RI.  Marland Kitchen Cancer ([place])    basal cell carcinoma of the skin  . Diabetes mellitus without complication ([place])   . Headache(784.0)   . Hematuria, microscopic "SINCE I WAS A KID"  . Hyperlipidemia   . Hypertension   . Ischemic cardiomyopathy    a. 05/2016: echo w/ EF of 25-35% and akinesis of the basal-midinferolateral and inferior myocardium.  . Lactose intolerance   . Myocardial infarction   . Respiratory failure ([place])   . Rotator cuff tear RIGHT  . Tobacco use    a. quit in 05/2016 following MI    Tobacco Use: History  Smoking Status  . Current Some Day Smoker  . Types: Cigarettes  Smokeless Tobacco  . Never Used    Comment: last use 07/2016    Labs: Recent Review Flowsheet Data    Labs for ITP Cardiac and Pulmonary Rehab Latest Ref Rng & Units 08/30/2016 08/31/2016 09/22/2016 11/17/2016 12/22/2016   Cholestrol 0 - 200 mg/dL - - - - -   LDLCALC 0 - 99 mg/dL - - - - -   LDLDIRECT mg/dL - - - - -   HDL >39.00 mg/dL - - - - -   Trlycerides  0.0 - 149.0 mg/dL - - - - -   Hemoglobin A1c - - - 7.2 8.6 8.6   PHART 7.350 - 7.450 - - - - -   PCO2ART 32.0 - 48.0 mmHg - - - - -   HCO3 20.0 - 28.0 mmol/L - - - - -   TCO2 0 - 100 mmol/L - - - - -   ACIDBASEDEF 0.0 - 2.0 mmol/L - - - - -   O2SAT % 49.8 50.9 - - -       Exercise Target Goals:    Exercise Program Goal: Individual exercise prescription set with THRR, safety & activity barriers. Participant demonstrates ability to understand and report RPE using BORG scale, to self-measure pulse accurately, and to acknowledge the importance of the exercise prescription.  Exercise Prescription Goal: Starting with aerobic activity 30 plus minutes a day, 3 days per week for initial exercise prescription. Provide home exercise prescription and guidelines  that participant acknowledges understanding prior to discharge.  Activity Barriers & Risk Stratification:     Activity Barriers & Cardiac Risk Stratification - 10/26/16 1115      Activity Barriers & Cardiac Risk Stratification   Activity Barriers Deconditioning;Muscular Weakness;Other (comment)   Comments Hx Right Shoulder Rotator Cuff repair   Cardiac Risk Stratification High      6 Minute Walk:     6 Minute Walk    Row Name 10/26/16 1449         6 Minute Walk   Phase Initial     Distance 1460 feet     Walk Time 6 minutes     # of Rest Breaks 0     MPH 2.77     METS 4.04     RPE 12     VO2 Peak 14.14     Symptoms No     Resting HR 77 bpm     Resting BP 128/74     Max Ex. HR 110 bpm     Max Ex. BP 126/54     2 Minute Post BP 114/56        Oxygen Initial Assessment:   Oxygen Re-Evaluation:   Oxygen Discharge (Final Oxygen Re-Evaluation):   Initial Exercise Prescription:     Initial Exercise Prescription - 10/26/16 1400      Date of Initial Exercise RX and Referring Provider   Date 10/26/16   Referring Provider Lyman Bishop MD     Treadmill   MPH 2.5   Grade 2   Minutes 15   METs 3.6     REL-XR   Level 3   Watts --  speed >50   Minutes 15   METs 3     T5 Nustep   Level 3   SPM --  80-100 spm   Minutes 15   METs 3     Prescription Details   Frequency (times per week) 3   Duration Progress to 45 minutes of aerobic exercise without signs/symptoms of physical distress     Intensity   THRR 40-80% of Max Heartrate 112-147   Ratings of Perceived Exertion 11-15   Perceived Dyspnea 0-4     Progression   Progression Continue to progress workloads to maintain intensity without signs/symptoms of physical distress.     Resistance Training   Training Prescription Yes   Weight 3 lbs   Reps 10-15      Perform Capillary Blood Glucose checks as needed.  Exercise Prescription Changes:     Exercise  Prescription Changes    Row Name  10/26/16 1400 10/29/16 1100 11/13/16 1200         Response to Exercise   Blood Pressure (Admit) 128/74 114/68 128/70     Blood Pressure (Exercise) 126/54 124/68 136/66     Blood Pressure (Exit) 114/56 122/78 102/62     Heart Rate (Admit) 77 bpm 79 bpm 87 bpm     Heart Rate (Exercise) 110 bpm 94 bpm 102 bpm     Heart Rate (Exit) 84 bpm 83 bpm 86 bpm     Oxygen Saturation (Admit) 100 %  -  -     Oxygen Saturation (Exercise) 99 %  -  -     Rating of Perceived Exertion (Exercise) '12 13 13     ' Symptoms none none none     Duration  - Progress to 45 minutes of aerobic exercise without signs/symptoms of physical distress Progress to 45 minutes of aerobic exercise without signs/symptoms of physical distress     Intensity  - THRR unchanged THRR unchanged       Progression   Progression  - Continue to progress workloads to maintain intensity without signs/symptoms of physical distress. Continue to progress workloads to maintain intensity without signs/symptoms of physical distress.     Average METs  - 2.75 2.85       Resistance Training   Training Prescription  - Yes Yes     Weight  - 3 3     Reps  - 10-15 10-15       Interval Training   Interval Training  - No No       Treadmill   MPH  -  - 2.5     Grade  -  - 2     Minutes  -  - 15     METs  -  - 3.6       REL-XR   Level  - 3  -     Minutes  - 15  -     METs  - 3.3  -       T5 Nustep   Level  - 3 3     Minutes  - 15 15     METs  - 2.2 2.1       Exercise Review   Progression -  walk test results  -  -        Exercise Comments:     Exercise Comments    Row Name 10/26/16 1453 10/29/16 1151 11/13/16 1209       Exercise Comments Pilar Plate wants to increase his strength, stamina, and endurance.  He would like to be able to get back to work and life being stable. Pilar Plate did well his first full day of exercise. Pilar Plate has tolerated exercise very well in his first two weeks.  Staff will encourage him to attain his Target HR range  as he has been below.        Exercise Goals and Review:   Exercise Goals Re-Evaluation :   Discharge Exercise Prescription (Final Exercise Prescription Changes):     Exercise Prescription Changes - 11/13/16 1200      Response to Exercise   Blood Pressure (Admit) 128/70   Blood Pressure (Exercise) 136/66   Blood Pressure (Exit) 102/62   Heart Rate (Admit) 87 bpm   Heart Rate (Exercise) 102 bpm   Heart Rate (Exit) 86 bpm   Rating of Perceived Exertion (Exercise) 13   Symptoms none  Duration Progress to 45 minutes of aerobic exercise without signs/symptoms of physical distress   Intensity THRR unchanged     Progression   Progression Continue to progress workloads to maintain intensity without signs/symptoms of physical distress.   Average METs 2.85     Resistance Training   Training Prescription Yes   Weight 3   Reps 10-15     Interval Training   Interval Training No     Treadmill   MPH 2.5   Grade 2   Minutes 15   METs 3.6     T5 Nustep   Level 3   Minutes 15   METs 2.1      Nutrition:  Target Goals: Understanding of nutrition guidelines, daily intake of sodium <1548m, cholesterol <201m calories 30% from fat and 7% or less from saturated fats, daily to have 5 or more servings of fruits and vegetables.  Biometrics:     Pre Biometrics - 10/26/16 1453      Pre Biometrics   Height 5' 5.5" (1.664 m)   Weight 165 lb 12.8 oz (75.2 kg)   Waist Circumference 35.5 inches   Hip Circumference 37.5 inches   Waist to Hip Ratio 0.95 %   BMI (Calculated) 27.2   Single Leg Stand 1.93 seconds       Nutrition Therapy Plan and Nutrition Goals:     Nutrition Therapy & Goals - 10/26/16 1228      Nutrition Therapy   Drug/Food Interactions Statins/Certain Fruits;Coumadin/Vit K     Intervention Plan   Intervention Prescribe, educate and counsel regarding individualized specific dietary modifications aiming towards targeted core components such as weight,  hypertension, lipid management, diabetes, heart failure and other comorbidities.   Expected Outcomes Short Term Goal: Understand basic principles of dietary content, such as calories, fat, sodium, cholesterol and nutrients.;Short Term Goal: A plan has been developed with personal nutrition goals set during dietitian appointment.;Long Term Goal: Adherence to prescribed nutrition plan.      Nutrition Discharge: Rate Your Plate Scores:     Nutrition Assessments - 10/26/16 1342      MEDFICTS Scores   Pre Score 78      Nutrition Goals Re-Evaluation:   Nutrition Goals Discharge (Final Nutrition Goals Re-Evaluation):   Psychosocial: Target Goals: Acknowledge presence or absence of significant depression and/or stress, maximize coping skills, provide positive support system. Participant is able to verbalize types and ability to use techniques and skills needed for reducing stress and depression.   Initial Review & Psychosocial Screening:     Initial Psych Review & Screening - 10/26/16 1232      Initial Review   Current issues with Current Depression;Current Stress Concerns     Family Dynamics   Good Support System? Yes   Comments JaBreakers separated from his wife but his daughter was here today and was very supportive. JaCidaid he is a [place] lately has been depressed and usually sees the positive in life. Aithan's daughter emphasized and he added that this all started wtih a infected hair follicle while he was away for the weekend at the beach with his wife. The hair follicle infection grew and MD told him had to have surgery in the hospital-then due to his diabetes he had a heart attack. Delevlped MRSA infection then needed open heart surgery.       Quality of Life Scores:      Quality of Life - 10/26/16 1235      Quality of Life Scores  Health/Function Pre 18.13 %   Socioeconomic Pre 19.86 %   Psych/Spiritual Pre 15.43 %   Family Pre 19.4 %   GLOBAL Pre 18.12 %       PHQ-9: Recent Review Flowsheet Data    Depression screen [facility] 2/9 10/26/2016   Decreased Interest 2   Down, Depressed, Hopeless 2   PHQ - 2 Score 4   Altered sleeping 1   Tired, decreased energy 1   Change in appetite 0   Feeling bad or failure about yourself  2   Trouble concentrating 0   Moving slowly or fidgety/restless 0   Suicidal thoughts 0   PHQ-9 Score 8   Difficult doing work/chores Somewhat difficult     Interpretation of Total Score  Total Score Depression Severity:  1-4 = Minimal depression, 5-9 = Mild depression, 10-14 = Moderate depression, 15-19 = Moderately severe depression, 20-27 = Severe depression   Psychosocial Evaluation and Intervention:     Psychosocial Evaluation - 11/11/16 1723      Psychosocial Evaluation & Interventions   Interventions Encouraged to exercise with the program and follow exercise prescription;Stress management education;Relaxation education   Comments Counselor met with Mr. Marcello Moores Pilar Plate) today for initial psychosocial evaluation.  He is a 56 year old who had open heart surgery in November.  He has a strong support system with (2) adult daughters and his mother who all live close by.  Pilar Plate has diabetes in addition to his heart problems.  He reports sleeping "terribly" with several nights each week not able to go to sleep until 6-7 AM.  He stated his Dr. suggested Melatonin but he has tried without success and wondered if he needed a stronger dose.  Counselor encouraged him to consider this, since the Dr. recommended and to check with his pharmacist or Dr. Donivan Scull: dosage.  Pilar Plate has a good appetite currently.  He denies a history of depression or anxiety, but admits to current symptoms since the open heart surgery.  He reported he and his spouse separated before the surgery and his sister passed away last year.  He has also been experiencing tremors in his hands since the surgery and is scheduled to see a Neurosurgeon early February to evaluate  this further.  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  He is not sure how he is going to be able to come to this class and go back to work with that type of schedule.  His goals for this program are to feel better and get over his depressive symptoms.  Counselor provided information on several psychiatrists since he states he is on something; but wonders if it might be causing the tremors.  Counselor will follow with Pilar Plate over the course of this program.    Continue Psychosocial Services  Yes  Follow with Pilar Plate re: depressive symptoms and sleep.  Gave info on psychiatrists as well.        Psychosocial Re-Evaluation:   Psychosocial Discharge (Final Psychosocial Re-Evaluation):   Vocational Rehabilitation: Provide vocational rehab assistance to qualifying candidates.   Vocational Rehab Evaluation & Intervention:     Vocational Rehab - 10/26/16 1114      Initial Vocational Rehab Evaluation & Intervention   Assessment shows need for Vocational Rehabilitation No      Education: Education Goals: Education classes will be provided on a weekly basis, covering required topics. Participant  will state understanding/return demonstration of topics presented.  Learning Barriers/Preferences:     Learning Barriers/Preferences - 10/26/16 1115      Learning Barriers/Preferences   Learning Barriers None   Learning Preferences Individual Instruction      Education Topics: General Nutrition Guidelines/Fats and Fiber: -Group instruction provided by verbal, written material, models and posters to present the general guidelines for heart healthy nutrition. Gives an explanation and review of dietary fats and fiber.   Controlling Sodium/Reading Food Labels: -Group verbal and written material supporting the discussion of sodium use in heart healthy nutrition. Review and explanation  with models, verbal and written materials for utilization of the food label.   Cardiac Rehab from 11/11/2016 in [facility] Cardiac and Pulmonary Rehab  Date  11/02/16  Educator  PI  Instruction Review Code  2- meets goals/outcomes      Exercise Physiology & Risk Factors: - Group verbal and written instruction with models to review the exercise physiology of the cardiovascular system and associated critical values. Details cardiovascular disease risk factors and the goals associated with each risk factor.   Aerobic Exercise & Resistance Training: - Gives group verbal and written discussion on the health impact of inactivity. On the components of aerobic and resistive training programs and the benefits of this training and how to safely progress through these programs.   Cardiac Rehab from 11/11/2016 in [facility] Cardiac and Pulmonary Rehab  Date  11/11/16  Educator  AS  Instruction Review Code  2- meets goals/outcomes      Flexibility, Balance, General Exercise Guidelines: - Provides group verbal and written instruction on the benefits of flexibility and balance training programs. Provides general exercise guidelines with specific guidelines to those with heart or lung disease. Demonstration and skill practice provided.   Stress Management: - Provides group verbal and written instruction about the health risks of elevated stress, cause of high stress, and healthy ways to reduce stress.   Depression: - Provides group verbal and written instruction on the correlation between heart/lung disease and depressed mood, treatment options, and the stigmas associated with seeking treatment.   Cardiac Rehab from 11/11/2016 in [facility] Cardiac and Pulmonary Rehab  Date  10/28/16  Educator  Lucianne Lei, MSW  Instruction Review Code  2- meets goals/outcomes      Anatomy & Physiology of the Heart: - Group verbal and written instruction and models provide basic cardiac anatomy and physiology, with the coronary  electrical and arterial systems. Review of: AMI, Angina, Valve disease, Heart Failure, Cardiac Arrhythmia, Pacemakers, and the ICD.   Cardiac Procedures: - Group verbal and written instruction and models to describe the testing methods done to diagnose heart disease. Reviews the outcomes of the test results. Describes the treatment choices: Medical Management, Angioplasty, or Coronary Bypass Surgery.   Cardiac Medications: - Group verbal and written instruction to review commonly prescribed medications for heart disease. Reviews the medication, class of the drug, and side effects. Includes the steps to properly store meds and maintain the prescription regimen.   Go Sex-Intimacy & Heart Disease, Get SMART - Goal Setting: - Group verbal and written instruction through game format to discuss heart disease and the return to sexual intimacy. Provides group verbal and written material to discuss and apply goal setting through the application of the S.M.A.R.T. Method.   Other Matters of the Heart: - Provides group verbal, written materials and models to describe Heart Failure, Angina, Valve Disease, and Diabetes in the realm of heart disease. Includes description of the  disease process and treatment options available to the cardiac patient.   Exercise & Equipment Safety: - Individual verbal instruction and demonstration of equipment use and safety with use of the equipment.   Cardiac Rehab from 11/11/2016 in [facility] Cardiac and Pulmonary Rehab  Date  10/28/16  Educator  C. EnterkinRN  Instruction Review Code  1- partially meets, needs review/practice      Infection Prevention: - Provides verbal and written material to individual with discussion of infection control including proper hand washing and proper equipment cleaning during exercise session.   Cardiac Rehab from 11/11/2016 in [facility] Cardiac and Pulmonary Rehab  Date  10/26/16  Educator  C. EnterkinRN  Instruction Review Code  2- meets  goals/outcomes      Falls Prevention: - Provides verbal and written material to individual with discussion of falls prevention and safety.   Cardiac Rehab from 11/11/2016 in [facility] Cardiac and Pulmonary Rehab  Date  10/26/16  Educator  C. Harrison  Instruction Review Code  2- meets goals/outcomes      Diabetes: - Individual verbal and written instruction to review signs/symptoms of diabetes, desired ranges of glucose level fasting, after meals and with exercise. Advice that pre and post exercise glucose checks will be done for 3 sessions at entry of program.   Cardiac Rehab from 11/11/2016 in [facility] Cardiac and Pulmonary Rehab  Date  10/26/16  Educator  C. Miller  Instruction Review Code  1- partially meets, needs review/practice       Knowledge Questionnaire Score:     Knowledge Questionnaire Score - 10/26/16 1114      Knowledge Questionnaire Score   Pre Score 23      Core Components/Risk Factors/Patient Goals at Admission:     Personal Goals and Risk Factors at Admission - 10/26/16 1234      Core Components/Risk Factors/Patient Goals on Admission    Weight Management Yes;Weight Maintenance   Intervention Weight Management: Develop a combined nutrition and exercise program designed to reach desired caloric intake, while maintaining appropriate intake of nutrient and fiber, sodium and fats, and appropriate energy expenditure required for the weight goal.;Weight Management: Provide education and appropriate resources to help participant work on and attain dietary goals.   Admit Weight 165 lb 12.8 oz (75.2 kg)   Goal Weight: Short Term 165 lb (74.8 kg)   Goal Weight: Long Term 162 lb (73.5 kg)   Expected Outcomes Long Term: Adherence to nutrition and physical activity/exercise program aimed toward attainment of established weight goal;Short Term: Continue to assess and modify interventions until short term weight is achieved;Weight Maintenance: Understanding of the daily  nutrition guidelines, which includes 25-35% calories from fat, 7% or less cal from saturated fats, less than 219m cholesterol, less than 1.5gm of sodium, & 5 or more servings of fruits and vegetables daily   Increase Strength and Stamina Yes   Intervention Provide advice, education, support and counseling about physical activity/exercise needs.;Develop an individualized exercise prescription for aerobic and resistive training based on initial evaluation findings, risk stratification, comorbidities and participant's personal goals.   Expected Outcomes Achievement of increased cardiorespiratory fitness and enhanced flexibility, muscular endurance and strength shown through measurements of functional capacity and personal statement of participant.   Tobacco Cessation Yes   Intervention Assist the participant in steps to quit. Provide individualized education and counseling about committing to Tobacco Cessation, relapse prevention, and pharmacological support that can be provided by physician.;OAdvice worker assist with locating and accessing local/national Quit Smoking programs, and support  quit date choice.   Expected Outcomes Long Term: Complete abstinence from all tobacco products for at least 12 months from quit date.   Diabetes Yes   Intervention Provide education about signs/symptoms and action to take for hypo/hyperglycemia.;Provide education about proper nutrition, including hydration, and aerobic/resistive exercise prescription along with prescribed medications to achieve blood glucose in normal ranges: Fasting glucose 65-99 mg/dL   Expected Outcomes Short Term: Participant verbalizes understanding of the signs/symptoms and immediate care of hyper/hypoglycemia, proper foot care and importance of medication, aerobic/resistive exercise and nutrition plan for blood glucose control.;Long Term: Attainment of HbA1C < 7%.   Hypertension Yes   Intervention Provide education on lifestyle  modifcations including regular physical activity/exercise, weight management, moderate sodium restriction and increased consumption of fresh fruit, vegetables, and low fat dairy, alcohol moderation, and smoking cessation.;Monitor prescription use compliance.   Expected Outcomes Short Term: Continued assessment and intervention until BP is < 140/68m HG in hypertensive participants. < 130/832mHG in hypertensive participants with diabetes, heart failure or chronic kidney disease.;Long Term: Maintenance of blood pressure at goal levels.   Lipids Yes   Intervention Provide education and support for participant on nutrition & aerobic/resistive exercise along with prescribed medications to achieve LDL <7085mHDL >71m71m Expected Outcomes Short Term: Participant states understanding of desired cholesterol values and is compliant with medications prescribed. Participant is following exercise prescription and nutrition guidelines.;Long Term: Cholesterol controlled with medications as prescribed, with individualized exercise RX and with personalized nutrition plan. Value goals: LDL < 70mg46mL > 40 mg.   Stress Yes   Intervention Offer individual and/or small group education and counseling on adjustment to heart disease, stress management and health-related lifestyle change. Teach and support self-help strategies.;Refer participants experiencing significant psychosocial distress to appropriate mental health specialists for further evaluation and treatment. When possible, include family members and significant others in education/counseling sessions.   Expected Outcomes Short Term: Participant demonstrates changes in health-related behavior, relaxation and other stress management skills, ability to obtain effective social support, and compliance with psychotropic medications if prescribed.;Long Term: Emotional wellbeing is indicated by absence of clinically significant psychosocial distress or social isolation.    Personal Goal Other Yes   Personal Goal --  JamesOrvaneparated from his wife but his daughter was here today and was very supportive. JamesOberon he is a ChrisPanamalately has been depressed and usually sees the positive in life. Bradlee's daughter emphasized and he added that this all started wti   Intervention Use of exercise equipment [place] to meet with Mental Health Counselor on staff.   Expected Outcomes To discuss with his doctor his limitations after his CABG 09/02/2016 but hopefully be able to get his stamina back for work.       Core Components/Risk Factors/Patient Goals Review:    Core Components/Risk Factors/Patient Goals at Discharge (Final Review):    ITP Comments:     ITP Comments    Row Name 10/26/16 1227 11/18/16 0654 11/27/16 1228 12/16/16 0646 01/13/17 0637 [id number] Comments ITP created during Medical review today. Documentation of diagnosis [facility] Admission 08/24/2016 CABG x3 30 day review. Continue with ITP unless directed changes per Medical Director review. New to program  I left vm to check on him since he has not been in [facility]. Noticed in chart he has been to Movement Disorder MD since his essential tremor of 30 years he reports got worse after his MI. 30 day review. Continue with  ITP unless directed changes per Medical Director review 30 day review. Continue with ITP unless directed changes per Medical Director review.  has been out since last visit in Feb      Comments:

## 2017-01-14 NOTE — Progress Notes (Signed)
Subjective:   [name] was seen in consultation in the movement disorder clinic at the request of [name], NP.  The records that were made available to me were reviewed.  The evaluation is for tremor.  Patient states that he has had tremor for  20-30 years (but that was mild), but the tremor seemed to get worse after his myocardial infarction in August, 2017.  He had CABG in November, 2017.  This hospital records were reviewed.  During that hospital admission, the patient was placed on amiodarone.  Daughter does state that tremors seemed to increase before amiodarone started but after first hospitalization.  Pt agrees but states that they have been worse since the CABG.  Pts A1C was found to be very elevated at over 12.  Pt states that He did strictly control his diet for several months and his hemoglobin A1c came down to 7 but then he was worse with diet and now it is 8.6.  Affected by caffeine:  Doesn't know (was drinking lots of coke - 10 per day - now 2-3 day) Affected by alcohol:  Doesn't drink Affected by stress:  No. Affected by fatigue:  No. Spills soup if on spoon:  Yes.   Spills glass of liquid if full:  Yes.   Affects ADL's (tying shoes, brushing teeth, etc):  Yes.    Current/Previously tried tremor medications: n/a  Current medications that may exacerbate tremor:  Amiodarone  01/18/17 update:  Patient seen today in follow-up.  His daughter accompanies him and supplements the history.  I reviewed records since our last visit.  Patient's amiodarone was discontinued on 12/07/2016.  Patient called 8 days after discontinuing the amiodarone and wanted a medication for tremor.  While I often tell patients to wait 6 months, I told the patient that I wanted him to wait at least 2 months before we consider medication.  He states today that he is still having tremor.  He states that he thought he was improving but he seems to be back at baseline.  He is back to work and doing well.  He  does Engineer, agricultural.  States that biggest c/o is legs jerking and moving.  Starts when "I get into the bed" and then I can feel them want to keep moving.  Asked pharmacist and told to try Mg++ and it helped initially but not working anymore.  Outside reports reviewed: historical medical records, lab reports, office notes and referral letter/letters.  Allergies  Allergen Reactions  . Latex Rash    Outpatient Encounter Prescriptions as of 01/18/2017  Medication Sig  . aspirin 81 MG chewable tablet Chew 1 tablet (81 mg total) by mouth daily.  Marland Kitchen atorvastatin (LIPITOR) 80 MG tablet Take 1 tablet (80 mg total) by mouth at bedtime.  . canagliflozin (INVOKANA) 100 MG TABS tablet Take 1 tablet (100 mg total) by mouth daily before breakfast.  . glipiZIDE (GLUCOTROL) 10 MG tablet Take 1 tablet (10 mg total) by mouth 2 (two) times daily before a meal.  . hydrOXYzine (ATARAX/VISTARIL) 10 MG tablet Take 1 tablet (10 mg total) by mouth 3 (three) times daily as needed.  . metoprolol tartrate (LOPRESSOR) 25 MG tablet Take 1 tablet (25 mg total) by mouth 2 (two) times daily.  . [DISCONTINUED] blood glucose meter kit and supplies KIT Dispense based on patient and insurance preference. Use up to four times daily as directed. (FOR ICD-9 250.00, 250.01).  . [DISCONTINUED] ONE TOUCH ULTRA TEST test strip USE  UP TO 4 TIMES DAILY AS DIRECTED  . [DISCONTINUED] ONETOUCH DELICA LANCETS 41D MISC USE UP TO 4 TIMES DAILY AS DIRECTED   No facility-administered encounter medications on file as of 01/18/2017.     Past Medical History:  Diagnosis Date  . Abrasion L FOREARM  . Atrial fibrillation ([place])    a. initially occurring in post-op setting in 05/2016, started on Eliquis b. 08/2016: s/p clipping of atrial appendage at time of CABG --> Eliquis switched to Coumadin  . CAD (coronary artery disease)    a. 05/2016: NSTEMI, cath showing 3v disease with CABG recommended once MRSA infection resolved. b. 08/2016: CABG with  LIMA-LAD, SVG-D1, and SVG-RI.  Marland Kitchen Cancer ([place])    basal cell carcinoma of the skin  . Diabetes mellitus without complication ([place])   . Headache(784.0)   . Hematuria, microscopic "SINCE I WAS A KID"  . Hyperlipidemia   . Hypertension   . Ischemic cardiomyopathy    a. 05/2016: echo w/ EF of 25-35% and akinesis of the basal-midinferolateral and inferior myocardium.  . Lactose intolerance   . Myocardial infarction   . Respiratory failure ([place])   . Rotator cuff tear RIGHT  . Tobacco use    a. quit in 05/2016 following MI    Past Surgical History:  Procedure Laterality Date  . CARDIAC CATHETERIZATION N/A 06/16/2016   Procedure: Left Heart Cath and Coronary Angiography;  Surgeon: Troy Sine, MD;  Location: [facility];  Service: Cardiovascular;  Laterality: N/A;  . CLIPPING OF ATRIAL APPENDAGE N/A 08/24/2016   Procedure: CLIPPING OF ATRIAL APPENDAGE;  Surgeon: Ivin Poot, MD;  Location: [facility];  Service: Open Heart Surgery;  Laterality: N/A;  . CORONARY ARTERY BYPASS GRAFT N/A 08/24/2016   Procedure: CORONARY ARTERY BYPASS GRAFTING (CABG) x three,  using left internal mammary artery and right leg greater saphenous vein harvested endoscopically;  Surgeon: Ivin Poot, MD;  Location: [facility];  Service: Open Heart Surgery;  Laterality: N/A;  . HERNIA REPAIR  X2  . INCISION AND DRAINAGE ABSCESS Left 06/10/2016   Procedure: INCISION AND DRAINAGE LEFT AXILLARY ABSCESS;  Surgeon: Autumn Messing III, MD;  Location: [facility];  Service: General;  Laterality: Left;  . ROTATOR CUFF REPAIR Bilateral   . TEE WITHOUT CARDIOVERSION N/A 08/24/2016   Procedure: TRANSESOPHAGEAL ECHOCARDIOGRAM (TEE);  Surgeon: Ivin Poot, MD;  Location: [facility];  Service: Open Heart Surgery;  Laterality: N/A;    Social History   Social History  . Marital status: Married    Spouse name: N/A  . Number of children: N/A  . Years of education: N/A   Occupational History  . Magazine features editor    Social History Main Topics  . Smoking status: Current Some Day Smoker    Types: Cigarettes  . Smokeless tobacco: Never Used     Comment: last use 07/2016  . Alcohol use No  . Drug use: No  . Sexual activity: Yes   Other Topics Concern  . Not on file   Social History Narrative  . No narrative on file    Family Status  Relation Status  . Father Deceased  . Mother Alive  . Sister Deceased  . Brother Deceased  . Daughter Alive  . Daughter Alive    Review of Systems A complete 10 system ROS was obtained and was negative apart from what is mentioned.   Objective:   VITALS:   Vitals:   01/18/17 6222  BP: 130/84  Pulse: 82  SpO2: 95%  Weight: 175 lb (79.4 kg)  Height: '5\' 5"'  (1.651 m)   Gen:  Appears stated age and in NAD. HEENT:  Normocephalic, atraumatic. The mucous membranes are moist. The superficial temporal arteries are without ropiness or tenderness. Cardiovascular: Regular rate and rhythm. Lungs: Clear to auscultation bilaterally. Neck: There are no carotid bruits noted bilaterally.  NEUROLOGICAL:  Orientation:  The patient is alert and oriented x 3.  Cranial nerves: There is good facial symmetry.  Extraocular muscles are intact and visual fields are full to confrontational testing. Speech is fluent and clear. Soft palate rises symmetrically and there is no tongue deviation. Hearing is intact to conversational tone. Tone: Tone is good throughout. Sensation: Sensation is intact to light touch touch throughout Coordination:  The patient has no dysdiadichokinesia or dysmetria. Motor: Strength is 5/5 in the bilateral upper and lower extremities.  Shoulder shrug is equal bilaterally.  There is no pronator drift.  There are no fasciculations noted. DTR's: Deep tendon reflexes are 2/4 at the bilateral biceps, triceps, brachioradialis, 1/4 at the bilateral patella and absent at the bilateral achilles.  Plantar responses are downgoing bilaterally. Gait and Station:  The patient is able to ambulate without difficulty. The patient is able to heel toe walk without any difficulty. The patient is able to ambulate in a tandem fashion. The patient is able to stand in the Romberg position.   MOVEMENT EXAM: Tremor:  There is no tremor at rest or with outstretched hands, even when given a weight.  Archimedes spirals are greatly improved.  The patient is able to pour water from one glass to another without spilling a significant amount, but minimal tremor is noted.  Lab Results  Component Value Date   WBC 7.4 09/21/2016   HGB 12.1 (L) 09/21/2016   HCT 38.3 (L) 09/21/2016   MCV 85.3 09/21/2016   PLT 170 09/21/2016   No results found for: TSH  No results found for: IRON, TIBC, FERRITIN      Chemistry      Component Value Date/Time   NA 139 09/21/2016 1605   K 4.8 09/21/2016 1605   CL 102 09/21/2016 1605   CO2 29 09/21/2016 1605   BUN 23 09/21/2016 1605   CREATININE 1.18 09/21/2016 1605      Component Value Date/Time   CALCIUM 9.3 09/21/2016 1605   ALKPHOS 81 09/03/2016 0539   AST 25 09/03/2016 0539   ALT 17 09/03/2016 0539   BILITOT 0.5 09/03/2016 0539        Assessment/Plan:   1.  Tremor  -Long discussion with the patient today.  It sounds like he may have always had a small degree of baseline, possibly essential tremor, but once placed on amiodarone the tremor got much worse.  He has been off of amiodarone since 12/07/16.  Clinically tremor is markedly improved.  Probably back to his baseline tremor.  He may want meds for it.  Discussed primidone.  Decided to hold today since starting other meds but may start over the phone  2.  Probable RLS  -check TSH, ferritin, iron stores  -start mirapex 0.125 mg q hs.  Risks, benefits, side effects and alternative therapies were discussed.  The opportunity to ask questions was given and they were answered to the best of my ability.  The patient expressed understanding and willingness to follow the outlined  treatment protocols.  3.    Tobacco abuse  -talked to him about overall health  and wellness and d/c this.  He had d/c it but picked it back up  4.  Pt will let me know if/when needs f/u after he sees cardiology.  Much greater than 50% of this visit was spent in counseling and coordinating care.  Total face to face time:  30 min.      CC:  [name], NP

## 2017-01-18 ENCOUNTER — Encounter: Payer: BLUE CROSS/BLUE SHIELD | Attending: Internal Medicine

## 2017-01-18 ENCOUNTER — Other Ambulatory Visit (INDEPENDENT_AMBULATORY_CARE_PROVIDER_SITE_OTHER): Payer: BLUE CROSS/BLUE SHIELD

## 2017-01-18 ENCOUNTER — Telehealth: Payer: Self-pay | Admitting: Neurology

## 2017-01-18 ENCOUNTER — Ambulatory Visit (INDEPENDENT_AMBULATORY_CARE_PROVIDER_SITE_OTHER): Payer: BLUE CROSS/BLUE SHIELD | Admitting: Neurology

## 2017-01-18 ENCOUNTER — Encounter: Payer: Self-pay | Admitting: Neurology

## 2017-01-18 VITALS — BP 130/84 | HR 82 | Ht 65.0 in | Wt 175.0 lb

## 2017-01-18 DIAGNOSIS — G2581 Restless legs syndrome: Secondary | ICD-10-CM | POA: Diagnosis not present

## 2017-01-18 DIAGNOSIS — D649 Anemia, unspecified: Secondary | ICD-10-CM

## 2017-01-18 DIAGNOSIS — G250 Essential tremor: Secondary | ICD-10-CM

## 2017-01-18 DIAGNOSIS — R251 Tremor, unspecified: Secondary | ICD-10-CM | POA: Diagnosis not present

## 2017-01-18 DIAGNOSIS — Z951 Presence of aortocoronary bypass graft: Secondary | ICD-10-CM | POA: Insufficient documentation

## 2017-01-18 DIAGNOSIS — I2510 Atherosclerotic heart disease of native coronary artery without angina pectoris: Secondary | ICD-10-CM | POA: Insufficient documentation

## 2017-01-18 LAB — CBC WITH DIFFERENTIAL/PLATELET
BASOS ABS: 0 10*3/uL (ref 0.0–0.1)
Basophils Relative: 0.5 % (ref 0.0–3.0)
Eosinophils Absolute: 0.3 10*3/uL (ref 0.0–0.7)
Eosinophils Relative: 2.9 % (ref 0.0–5.0)
HEMATOCRIT: 44.2 % (ref 39.0–52.0)
HEMOGLOBIN: 14.2 g/dL (ref 13.0–17.0)
LYMPHS PCT: 29.5 % (ref 12.0–46.0)
Lymphs Abs: 2.8 10*3/uL (ref 0.7–4.0)
MCHC: 32.3 g/dL (ref 30.0–36.0)
MCV: 80.5 fl (ref 78.0–100.0)
MONOS PCT: 8.3 % (ref 3.0–12.0)
Monocytes Absolute: 0.8 10*3/uL (ref 0.1–1.0)
NEUTROS ABS: 5.6 10*3/uL (ref 1.4–7.7)
Neutrophils Relative %: 58.8 % (ref 43.0–77.0)
Platelets: 151 10*3/uL (ref 150.0–400.0)
RBC: 5.49 Mil/uL (ref 4.22–5.81)
RDW: 17.4 % — ABNORMAL HIGH (ref 11.5–15.5)
WBC: 9.5 10*3/uL (ref 4.0–10.5)

## 2017-01-18 LAB — IBC PANEL
IRON: 48 ug/dL (ref 42–165)
SATURATION RATIOS: 11.6 % — AB (ref 20.0–50.0)
TRANSFERRIN: 296 mg/dL (ref 212.0–360.0)

## 2017-01-18 LAB — FERRITIN: Ferritin: 42.5 ng/mL (ref 22.0–322.0)

## 2017-01-18 LAB — TSH: TSH: 1.57 u[IU]/mL (ref 0.35–4.50)

## 2017-01-18 MED ORDER — PRAMIPEXOLE DIHYDROCHLORIDE 0.125 MG PO TABS: 0.1250 mg | ORAL_TABLET | Freq: Every day | ORAL | 2 refills | Status: DC

## 2017-01-18 NOTE — Telephone Encounter (Signed)
-----   Message from [place], DO sent at 01/18/2017  1:21 PM EDT ----- [name], let pt know that labs are okay

## 2017-01-18 NOTE — Telephone Encounter (Signed)
Left message on machine for patient to make him aware labs ok.

## 2017-01-18 NOTE — Patient Instructions (Signed)
1. Your provider has requested that you have labwork completed today. Please go to [facility] ([phone]) on the second floor of this building before leaving the office today. You do not need to check in. If you are not called within 15 minutes please check with the front desk.   2. We will start you on Mirapex 0.125 mg to take one tablet 30 minutes prior to bedtime. We will call in two weeks to see how you are doing on the medication.

## 2017-01-20 ENCOUNTER — Telehealth: Payer: Self-pay | Admitting: Adult Health

## 2017-01-20 NOTE — Telephone Encounter (Signed)
I have not received anything on my end. Have you received anything about this?

## 2017-01-20 NOTE — Telephone Encounter (Signed)
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

## 2017-01-21 ENCOUNTER — Encounter: Payer: Self-pay | Admitting: Adult Health

## 2017-01-21 ENCOUNTER — Telehealth: Payer: Self-pay | Admitting: Neurology

## 2017-01-21 DIAGNOSIS — Z951 Presence of aortocoronary bypass graft: Secondary | ICD-10-CM

## 2017-01-21 NOTE — Telephone Encounter (Signed)
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

## 2017-01-21 NOTE — Progress Notes (Signed)
Cardiac Individual Treatment Plan  Patient Details  Name: [name] MRN: [id number] Date of Birth: 1961/03/31 Referring Provider:     Cardiac Rehab from 10/26/2016 in [facility] Cardiac and Pulmonary Rehab  Referring Provider  Lyman Bishop MD      Initial Encounter Date:    Cardiac Rehab from 10/26/2016 in [facility] Cardiac and Pulmonary Rehab  Date  10/26/16  Referring Provider  Lyman Bishop MD      Visit Diagnosis: S/P CABG x 3  Patient's Home Medications on Admission:  Current Outpatient Prescriptions:  .  aspirin 81 MG chewable tablet, Chew 1 tablet (81 mg total) by mouth daily., Disp: 30 tablet, Rfl: 3 .  atorvastatin (LIPITOR) 80 MG tablet, Take 1 tablet (80 mg total) by mouth at bedtime., Disp: 90 tablet, Rfl: 3 .  canagliflozin (INVOKANA) 100 MG TABS tablet, Take 1 tablet (100 mg total) by mouth daily before breakfast., Disp: 30 tablet, Rfl: 3 .  glipiZIDE (GLUCOTROL) 10 MG tablet, Take 1 tablet (10 mg total) by mouth 2 (two) times daily before a meal., Disp: 180 tablet, Rfl: 1 .  hydrOXYzine (ATARAX/VISTARIL) 10 MG tablet, Take 1 tablet (10 mg total) by mouth 3 (three) times daily as needed., Disp: 30 tablet, Rfl: 0 .  metoprolol tartrate (LOPRESSOR) 25 MG tablet, Take 1 tablet (25 mg total) by mouth 2 (two) times daily., Disp: 60 tablet, Rfl: 11 .  pramipexole (MIRAPEX) 0.125 MG tablet, Take 1 tablet (0.125 mg total) by mouth at bedtime., Disp: 30 tablet, Rfl: 2  Past Medical History: Past Medical History:  Diagnosis Date  . Abrasion L FOREARM  . Atrial fibrillation ([place])    a. initially occurring in post-op setting in 05/2016, started on Eliquis b. 08/2016: s/p clipping of atrial appendage at time of CABG --> Eliquis switched to Coumadin  . CAD (coronary artery disease)    a. 05/2016: NSTEMI, cath showing 3v disease with CABG recommended once MRSA infection resolved. b. 08/2016: CABG with LIMA-LAD, SVG-D1, and SVG-RI.  Marland Kitchen Cancer ([place])    basal cell carcinoma of the skin  .  Diabetes mellitus without complication ([place])   . Headache(784.0)   . Hematuria, microscopic "SINCE I WAS A KID"  . Hyperlipidemia   . Hypertension   . Ischemic cardiomyopathy    a. 05/2016: echo w/ EF of 25-35% and akinesis of the basal-midinferolateral and inferior myocardium.  . Lactose intolerance   . Myocardial infarction   . Respiratory failure ([place])   . Rotator cuff tear RIGHT  . Tobacco use    a. quit in 05/2016 following MI    Tobacco Use: History  Smoking Status  . Current Some Day Smoker  . Types: Cigarettes  Smokeless Tobacco  . Never Used    Comment: last use 07/2016    Labs: Recent Review Flowsheet Data    Labs for ITP Cardiac and Pulmonary Rehab Latest Ref Rng & Units 08/30/2016 08/31/2016 09/22/2016 11/17/2016 12/22/2016   Cholestrol 0 - 200 mg/dL - - - - -   LDLCALC 0 - 99 mg/dL - - - - -   LDLDIRECT mg/dL - - - - -   HDL >39.00 mg/dL - - - - -   Trlycerides 0.0 - 149.0 mg/dL - - - - -   Hemoglobin A1c - - - 7.2 8.6 8.6   PHART 7.350 - 7.450 - - - - -   PCO2ART 32.0 - 48.0 mmHg - - - - -   HCO3 20.0 - 28.0 mmol/L - - - - -  TCO2 0 - 100 mmol/L - - - - -   ACIDBASEDEF 0.0 - 2.0 mmol/L - - - - -   O2SAT % 49.8 50.9 - - -       Exercise Target Goals:    Exercise Program Goal: Individual exercise prescription set with THRR, safety & activity barriers. Participant demonstrates ability to understand and report RPE using BORG scale, to self-measure pulse accurately, and to acknowledge the importance of the exercise prescription.  Exercise Prescription Goal: Starting with aerobic activity 30 plus minutes a day, 3 days per week for initial exercise prescription. Provide home exercise prescription and guidelines that participant acknowledges understanding prior to discharge.  Activity Barriers & Risk Stratification:     Activity Barriers & Cardiac Risk Stratification - 10/26/16 1115      Activity Barriers & Cardiac Risk Stratification   Activity Barriers  Deconditioning;Muscular Weakness;Other (comment)   Comments Hx Right Shoulder Rotator Cuff repair   Cardiac Risk Stratification High      6 Minute Walk:     6 Minute Walk    Row Name 10/26/16 1449         6 Minute Walk   Phase Initial     Distance 1460 feet     Walk Time 6 minutes     # of Rest Breaks 0     MPH 2.77     METS 4.04     RPE 12     VO2 Peak 14.14     Symptoms No     Resting HR 77 bpm     Resting BP 128/74     Max Ex. HR 110 bpm     Max Ex. BP 126/54     2 Minute Post BP 114/56        Oxygen Initial Assessment:   Oxygen Re-Evaluation:   Oxygen Discharge (Final Oxygen Re-Evaluation):   Initial Exercise Prescription:     Initial Exercise Prescription - 10/26/16 1400      Date of Initial Exercise RX and Referring Provider   Date 10/26/16   Referring Provider Lyman Bishop MD     Treadmill   MPH 2.5   Grade 2   Minutes 15   METs 3.6     REL-XR   Level 3   Watts --  speed >50   Minutes 15   METs 3     T5 Nustep   Level 3   SPM --  80-100 spm   Minutes 15   METs 3     Prescription Details   Frequency (times per week) 3   Duration Progress to 45 minutes of aerobic exercise without signs/symptoms of physical distress     Intensity   THRR 40-80% of Max Heartrate 112-147   Ratings of Perceived Exertion 11-15   Perceived Dyspnea 0-4     Progression   Progression Continue to progress workloads to maintain intensity without signs/symptoms of physical distress.     Resistance Training   Training Prescription Yes   Weight 3 lbs   Reps 10-15      Perform Capillary Blood Glucose checks as needed.  Exercise Prescription Changes:     Exercise Prescription Changes    Row Name 10/26/16 1400 10/29/16 1100 11/13/16 1200         Response to Exercise   Blood Pressure (Admit) 128/74 114/68 128/70     Blood Pressure (Exercise) 126/54 124/68 136/66     Blood Pressure (Exit) 114/56 122/78 102/62  Heart Rate (Admit) 77 bpm 79  bpm 87 bpm     Heart Rate (Exercise) 110 bpm 94 bpm 102 bpm     Heart Rate (Exit) 84 bpm 83 bpm 86 bpm     Oxygen Saturation (Admit) 100 %  -  -     Oxygen Saturation (Exercise) 99 %  -  -     Rating of Perceived Exertion (Exercise) _0 Symptoms none none none     Duration  - Progress to 45 minutes of aerobic exercise without signs/symptoms of physical distress Progress to 45 minutes of aerobic exercise without signs/symptoms of physical distress     Intensity  - THRR unchanged THRR unchanged       Progression   Progression  - Continue to progress workloads to maintain intensity without signs/symptoms of physical distress. Continue to progress workloads to maintain intensity without signs/symptoms of physical distress.     Average METs  - 2.75 2.85       Resistance Training   Training Prescription  - Yes Yes     Weight  - 3 3     Reps  - 10-15 10-15       Interval Training   Interval Training  - No No       Treadmill   MPH  -  - 2.5     Grade  -  - 2     Minutes  -  - 15     METs  -  - 3.6       REL-XR   Level  - 3  -     Minutes  - 15  -     METs  - 3.3  -       T5 Nustep   Level  - 3 3     Minutes  - 15 15     METs  - 2.2 2.1       Exercise Review   Progression -  walk test results  -  -        Exercise Comments:     Exercise Comments    Row Name 10/26/16 1453 10/29/16 1151 11/13/16 1209       Exercise Comments Pilar Plate wants to increase his strength, stamina, and endurance.  He would like to be able to get back to work and life being stable. Pilar Plate did well his first full day of exercise. Pilar Plate has tolerated exercise very well in his first two weeks.  Staff will encourage him to attain his Target HR range as he has been below.        Exercise Goals and Review:   Exercise Goals Re-Evaluation :   Discharge Exercise Prescription (Final Exercise Prescription Changes):     Exercise Prescription Changes - 11/13/16 1200      Response to Exercise    Blood Pressure (Admit) 128/70   Blood Pressure (Exercise) 136/66   Blood Pressure (Exit) 102/62   Heart Rate (Admit) 87 bpm   Heart Rate (Exercise) 102 bpm   Heart Rate (Exit) 86 bpm   Rating of Perceived Exertion (Exercise) 13   Symptoms none   Duration Progress to 45 minutes of aerobic exercise without signs/symptoms of physical distress   Intensity THRR unchanged     Progression   Progression Continue to progress workloads to maintain intensity without signs/symptoms of physical distress.   Average METs 2.85     Resistance Training   Training Prescription Yes  Weight 3   Reps 10-15     Interval Training   Interval Training No     Treadmill   MPH 2.5   Grade 2   Minutes 15   METs 3.6     T5 Nustep   Level 3   Minutes 15   METs 2.1      Nutrition:  Target Goals: Understanding of nutrition guidelines, daily intake of sodium <1515m, cholesterol <2052m calories 30% from fat and 7% or less from saturated fats, daily to have 5 or more servings of fruits and vegetables.  Biometrics:     Pre Biometrics - 10/26/16 1453      Pre Biometrics   Height 5' 5.5" (1.664 m)   Weight 165 lb 12.8 oz (75.2 kg)   Waist Circumference 35.5 inches   Hip Circumference 37.5 inches   Waist to Hip Ratio 0.95 %   BMI (Calculated) 27.2   Single Leg Stand 1.93 seconds       Nutrition Therapy Plan and Nutrition Goals:     Nutrition Therapy & Goals - 10/26/16 1228      Nutrition Therapy   Drug/Food Interactions Statins/Certain Fruits;Coumadin/Vit K     Intervention Plan   Intervention Prescribe, educate and counsel regarding individualized specific dietary modifications aiming towards targeted core components such as weight, hypertension, lipid management, diabetes, heart failure and other comorbidities.   Expected Outcomes Short Term Goal: Understand basic principles of dietary content, such as calories, fat, sodium, cholesterol and nutrients.;Short Term Goal: A plan has been  developed with personal nutrition goals set during dietitian appointment.;Long Term Goal: Adherence to prescribed nutrition plan.      Nutrition Discharge: Rate Your Plate Scores:     Nutrition Assessments - 10/26/16 1342      MEDFICTS Scores   Pre Score 78      Nutrition Goals Re-Evaluation:   Nutrition Goals Discharge (Final Nutrition Goals Re-Evaluation):   Psychosocial: Target Goals: Acknowledge presence or absence of significant depression and/or stress, maximize coping skills, provide positive support system. Participant is able to verbalize types and ability to use techniques and skills needed for reducing stress and depression.   Initial Review & Psychosocial Screening:     Initial Psych Review & Screening - 10/26/16 1232      Initial Review   Current issues with Current Depression;Current Stress Concerns     Family Dynamics   Good Support System? Yes   Comments JaTravions separated from his wife but his daughter was here today and was very supportive. JaKedariusaid he is a [place] lately has been depressed and usually sees the positive in life. Marvion's daughter emphasized and he added that this all started wtih a infected hair follicle while he was away for the weekend at the beach with his wife. The hair follicle infection grew and MD told him had to have surgery in the hospital-then due to his diabetes he had a heart attack. Delevlped MRSA infection then needed open heart surgery.       Quality of Life Scores:      Quality of Life - 10/26/16 1235      Quality of Life Scores   Health/Function Pre 18.13 %   Socioeconomic Pre 19.86 %   Psych/Spiritual Pre 15.43 %   Family Pre 19.4 %   GLOBAL Pre 18.12 %      PHQ-9: Recent Review Flowsheet Data    [facility]   Decreased Interest 2   Down,  Depressed, Hopeless 2   PHQ - 2 Score 4   Altered sleeping 1   Tired, decreased energy 1   Change in appetite 0   Feeling bad or failure  about yourself  2   Trouble concentrating 0   Moving slowly or fidgety/restless 0   Suicidal thoughts 0   PHQ-9 Score 8   Difficult doing work/chores Somewhat difficult     Interpretation of Total Score  Total Score Depression Severity:  1-4 = Minimal depression, 5-9 = Mild depression, 10-14 = Moderate depression, 15-19 = Moderately severe depression, 20-27 = Severe depression   Psychosocial Evaluation and Intervention:     Psychosocial Evaluation - 11/11/16 1723      Psychosocial Evaluation & Interventions   Interventions Encouraged to exercise with the program and follow exercise prescription;Stress management education;Relaxation education   Comments Counselor met with Mr. Marcello Moores Pilar Plate) today for initial psychosocial evaluation.  He is a 56 year old who had open heart surgery in November.  He has a strong support system with (2) adult daughters and his mother who all live close by.  Pilar Plate has diabetes in addition to his heart problems.  He reports sleeping "terribly" with several nights each week not able to go to sleep until 6-7 AM.  He stated his Dr. suggested Melatonin but he has tried without success and wondered if he needed a stronger dose.  Counselor encouraged him to consider this, since the Dr. recommended and to check with his pharmacist or Dr. Donivan Scull: dosage.  Pilar Plate has a good appetite currently.  He denies a history of depression or anxiety, but admits to current symptoms since the open heart surgery.  He reported he and his spouse separated before the surgery and his sister passed away last year.  He has also been experiencing tremors in his hands since the surgery and is scheduled to see a Neurosurgeon early February to evaluate this further.  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  He is not sure how he is going to be able to come to this class and go back to work with  that type of schedule.  His goals for this program are to feel better and get over his depressive symptoms.  Counselor provided information on several psychiatrists since he states he is on something; but wonders if it might be causing the tremors.  Counselor will follow with Pilar Plate over the course of this program.    Continue Psychosocial Services  Yes  Follow with Pilar Plate re: depressive symptoms and sleep.  Gave info on psychiatrists as well.        Psychosocial Re-Evaluation:   Psychosocial Discharge (Final Psychosocial Re-Evaluation):   Vocational Rehabilitation: Provide vocational rehab assistance to qualifying candidates.   Vocational Rehab Evaluation & Intervention:     Vocational Rehab - 10/26/16 1114      Initial Vocational Rehab Evaluation & Intervention   Assessment shows need for Vocational Rehabilitation No      Education: Education Goals: Education classes will be provided on a weekly basis, covering required topics. Participant will state understanding/return demonstration of topics presented.  Learning Barriers/Preferences:     Learning Barriers/Preferences - 10/26/16 1115      Learning Barriers/Preferences   Learning Barriers None   Learning Preferences Individual Instruction      Education Topics: General Nutrition Guidelines/Fats and Fiber: -Group instruction provided by verbal,  written material, models and posters to present the general guidelines for heart healthy nutrition. Gives an explanation and review of dietary fats and fiber.   Controlling Sodium/Reading Food Labels: -Group verbal and written material supporting the discussion of sodium use in heart healthy nutrition. Review and explanation with models, verbal and written materials for utilization of the food label.   Cardiac Rehab from 11/11/2016 in [facility] Cardiac and Pulmonary Rehab  Date  11/02/16  Educator  PI  Instruction Review Code  2- meets goals/outcomes      Exercise Physiology &  Risk Factors: - Group verbal and written instruction with models to review the exercise physiology of the cardiovascular system and associated critical values. Details cardiovascular disease risk factors and the goals associated with each risk factor.   Aerobic Exercise & Resistance Training: - Gives group verbal and written discussion on the health impact of inactivity. On the components of aerobic and resistive training programs and the benefits of this training and how to safely progress through these programs.   Cardiac Rehab from 11/11/2016 in [facility] Cardiac and Pulmonary Rehab  Date  11/11/16  Educator  AS  Instruction Review Code  2- meets goals/outcomes      Flexibility, Balance, General Exercise Guidelines: - Provides group verbal and written instruction on the benefits of flexibility and balance training programs. Provides general exercise guidelines with specific guidelines to those with heart or lung disease. Demonstration and skill practice provided.   Stress Management: - Provides group verbal and written instruction about the health risks of elevated stress, cause of high stress, and healthy ways to reduce stress.   Depression: - Provides group verbal and written instruction on the correlation between heart/lung disease and depressed mood, treatment options, and the stigmas associated with seeking treatment.   Cardiac Rehab from 11/11/2016 in [facility] Cardiac and Pulmonary Rehab  Date  10/28/16  Educator  Lucianne Lei, MSW  Instruction Review Code  2- meets goals/outcomes      Anatomy & Physiology of the Heart: - Group verbal and written instruction and models provide basic cardiac anatomy and physiology, with the coronary electrical and arterial systems. Review of: AMI, Angina, Valve disease, Heart Failure, Cardiac Arrhythmia, Pacemakers, and the ICD.   Cardiac Procedures: - Group verbal and written instruction and models to describe the testing methods done to diagnose  heart disease. Reviews the outcomes of the test results. Describes the treatment choices: Medical Management, Angioplasty, or Coronary Bypass Surgery.   Cardiac Medications: - Group verbal and written instruction to review commonly prescribed medications for heart disease. Reviews the medication, class of the drug, and side effects. Includes the steps to properly store meds and maintain the prescription regimen.   Go Sex-Intimacy & Heart Disease, Get SMART - Goal Setting: - Group verbal and written instruction through game format to discuss heart disease and the return to sexual intimacy. Provides group verbal and written material to discuss and apply goal setting through the application of the S.M.A.R.T. Method.   Other Matters of the Heart: - Provides group verbal, written materials and models to describe Heart Failure, Angina, Valve Disease, and Diabetes in the realm of heart disease. Includes description of the disease process and treatment options available to the cardiac patient.   Exercise & Equipment Safety: - Individual verbal instruction and demonstration of equipment use and safety with use of the equipment.   Cardiac Rehab from 11/11/2016 in [facility] Cardiac and Pulmonary Rehab  Date  10/28/16  Educator  C. EnterkinRN  Instruction Review Code  1- partially meets, needs review/practice      Infection Prevention: - Provides verbal and written material to individual with discussion of infection control including proper hand washing and proper equipment cleaning during exercise session.   Cardiac Rehab from 11/11/2016 in [facility] Cardiac and Pulmonary Rehab  Date  10/26/16  Educator  C. EnterkinRN  Instruction Review Code  2- meets goals/outcomes      Falls Prevention: - Provides verbal and written material to individual with discussion of falls prevention and safety.   Cardiac Rehab from 11/11/2016 in [facility] Cardiac and Pulmonary Rehab  Date  10/26/16  Educator  C. [place]ree   Instruction Review Code  2- meets goals/outcomes      Diabetes: - Individual verbal and written instruction to review signs/symptoms of diabetes, desired ranges of glucose level fasting, after meals and with exercise. Advice that pre and post exercise glucose checks will be done for 3 sessions at entry of program.   Cardiac Rehab from 11/11/2016 in [facility] Cardiac and Pulmonary Rehab  Date  10/26/16  Educator  C. Kevin  Instruction Review Code  1- partially meets, needs review/practice       Knowledge Questionnaire Score:     Knowledge Questionnaire Score - 10/26/16 1114      Knowledge Questionnaire Score   Pre Score 23      Core Components/Risk Factors/Patient Goals at Admission:     Personal Goals and Risk Factors at Admission - 10/26/16 1234      Core Components/Risk Factors/Patient Goals on Admission    Weight Management Yes;Weight Maintenance   Intervention Weight Management: Develop a combined nutrition and exercise program designed to reach desired caloric intake, while maintaining appropriate intake of nutrient and fiber, sodium and fats, and appropriate energy expenditure required for the weight goal.;Weight Management: Provide education and appropriate resources to help participant work on and attain dietary goals.   Admit Weight 165 lb 12.8 oz (75.2 kg)   Goal Weight: Short Term 165 lb (74.8 kg)   Goal Weight: Long Term 162 lb (73.5 kg)   Expected Outcomes Long Term: Adherence to nutrition and physical activity/exercise program aimed toward attainment of established weight goal;Short Term: Continue to assess and modify interventions until short term weight is achieved;Weight Maintenance: Understanding of the daily nutrition guidelines, which includes 25-35% calories from fat, 7% or less cal from saturated fats, less than 212m cholesterol, less than 1.5gm of sodium, & 5 or more servings of fruits and vegetables daily   Increase Strength and Stamina Yes    Intervention Provide advice, education, support and counseling about physical activity/exercise needs.;Develop an individualized exercise prescription for aerobic and resistive training based on initial evaluation findings, risk stratification, comorbidities and participant's personal goals.   Expected Outcomes Achievement of increased cardiorespiratory fitness and enhanced flexibility, muscular endurance and strength shown through measurements of functional capacity and personal statement of participant.   Tobacco Cessation Yes   Intervention Assist the participant in steps to quit. Provide individualized education and counseling about committing to Tobacco Cessation, relapse prevention, and pharmacological support that can be provided by physician.;OAdvice worker assist with locating and accessing local/national Quit Smoking programs, and support quit date choice.   Expected Outcomes Long Term: Complete abstinence from all tobacco products for at least 12 months from quit date.   Diabetes Yes   Intervention Provide education about signs/symptoms and action to take for hypo/hyperglycemia.;Provide education about proper nutrition, including hydration, and aerobic/resistive exercise prescription along with prescribed medications  to achieve blood glucose in normal ranges: Fasting glucose 65-99 mg/dL   Expected Outcomes Short Term: Participant verbalizes understanding of the signs/symptoms and immediate care of hyper/hypoglycemia, proper foot care and importance of medication, aerobic/resistive exercise and nutrition plan for blood glucose control.;Long Term: Attainment of HbA1C < 7%.   Hypertension Yes   Intervention Provide education on lifestyle modifcations including regular physical activity/exercise, weight management, moderate sodium restriction and increased consumption of fresh fruit, vegetables, and low fat dairy, alcohol moderation, and smoking cessation.;Monitor prescription use  compliance.   Expected Outcomes Short Term: Continued assessment and intervention until BP is < 140/55m HG in hypertensive participants. < 130/869mHG in hypertensive participants with diabetes, heart failure or chronic kidney disease.;Long Term: Maintenance of blood pressure at goal levels.   Lipids Yes   Intervention Provide education and support for participant on nutrition & aerobic/resistive exercise along with prescribed medications to achieve LDL <7068mHDL >62m87m Expected Outcomes Short Term: Participant states understanding of desired cholesterol values and is compliant with medications prescribed. Participant is following exercise prescription and nutrition guidelines.;Long Term: Cholesterol controlled with medications as prescribed, with individualized exercise RX and with personalized nutrition plan. Value goals: LDL < 70mg59mL > 40 mg.   Stress Yes   Intervention Offer individual and/or small group education and counseling on adjustment to heart disease, stress management and health-related lifestyle change. Teach and support self-help strategies.;Refer participants experiencing significant psychosocial distress to appropriate mental health specialists for further evaluation and treatment. When possible, include family members and significant others in education/counseling sessions.   Expected Outcomes Short Term: Participant demonstrates changes in health-related behavior, relaxation and other stress management skills, ability to obtain effective social support, and compliance with psychotropic medications if prescribed.;Long Term: Emotional wellbeing is indicated by absence of clinically significant psychosocial distress or social isolation.   Personal Goal Other Yes   Personal Goal --  JamesJavadeparated from his wife but his daughter was here today and was very supportive. JamesGraham he [place] has been depressed and usually sees the positive in life. Donye's daughter  emphasized and he added that this all started wti   Inter[place] Mental Health Counselor on staff.   Expected Outcomes To discuss with his doctor his limitations after his CABG 09/02/2016 but hopefully be able to get his stamina back for work.       Core Components/Risk Factors/Patient Goals Review:    Core Components/Risk Factors/Patient Goals at Discharge (Final Review):    ITP Comments:     ITP Comments    Row Name 10/26/16 1227 11/18/16 0654 11/27/16 1228 12/16/16 0646 01/13/17 0637 [id number] Comments ITP created during Medical review today. Documentation of diagnosis [facility] Admission 08/24/2016 CABG x3 30 day review. Continue with ITP unless directed changes per Medical Director review. New to program  I left vm to check on him since he has not been in [facility]. Noticed in chart he has been to [facility] MD since his essential tremor of 30 years he reports got worse after his MI. 30 day review. Continue with ITP unless directed changes per Medical Director review 30 day review. Continue with ITP unless directed changes per Medical Director review.  has been out since last visit in Feb      Comments: Discharge ITP

## 2017-01-21 NOTE — Telephone Encounter (Signed)
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 I made her aware I did send a message to Dr. Carles Collet asking her to reach out to [name], NP after his visit with [place] on Monday- so if she sent a message it would have been sent to his private inbasket not a fax. Dr. Carles Collet is not in the office until Monday so she will check back with her PCP.

## 2017-01-21 NOTE — Telephone Encounter (Signed)
I returned phone call to patient's daughter and notified her that [name] has completed letter and it has been faxed in. Patient's daughter verbalized understanding.

## 2017-01-21 NOTE — Progress Notes (Signed)
Discharge Summary  Patient Details  Name: [name] MRN: [id number] Date of Birth: 1961-06-30 Referring Provider:     Cardiac Rehab from 10/26/2016 in [facility]  Referring Provider  Lyman Bishop MD       Number of Visits: 4  Reason for Discharge:  Early Exit:  Personal  Smoking History:  History  Smoking Status  . Current Some Day Smoker  . Types: Cigarettes  Smokeless Tobacco  . Never Used    Comment: last use 07/2016    Diagnosis:  S/P CABG x 3  ADL UCSD:   Initial Exercise Prescription:     Initial Exercise Prescription - 10/26/16 1400      Date of Initial Exercise RX and Referring Provider   Date 10/26/16   Referring Provider Lyman Bishop MD     Treadmill   MPH 2.5   Grade 2   Minutes 15   METs 3.6     REL-XR   Level 3   Watts --  speed >50   Minutes 15   METs 3     T5 Nustep   Level 3   SPM --  80-100 spm   Minutes 15   METs 3     Prescription Details   Frequency (times per week) 3   Duration Progress to 45 minutes of aerobic exercise without signs/symptoms of physical distress     Intensity   THRR 40-80% of Max Heartrate 112-147   Ratings of Perceived Exertion 11-15   Perceived Dyspnea 0-4     Progression   Progression Continue to progress workloads to maintain intensity without signs/symptoms of physical distress.     Resistance Training   Training Prescription Yes   Weight 3 lbs   Reps 10-15      Discharge Exercise Prescription (Final Exercise Prescription Changes):     Exercise Prescription Changes - 11/13/16 1200      Response to Exercise   Blood Pressure (Admit) 128/70   Blood Pressure (Exercise) 136/66   Blood Pressure (Exit) 102/62   Heart Rate (Admit) 87 bpm   Heart Rate (Exercise) 102 bpm   Heart Rate (Exit) 86 bpm   Rating of Perceived Exertion (Exercise) 13   Symptoms none   Duration Progress to 45 minutes of aerobic exercise without signs/symptoms of physical distress   Intensity THRR unchanged     Progression   Progression Continue to progress workloads to maintain intensity without signs/symptoms of physical distress.   Average METs 2.85     Resistance Training   Training Prescription Yes   Weight 3   Reps 10-15     Interval Training   Interval Training No     Treadmill   MPH 2.5   Grade 2   Minutes 15   METs 3.6     T5 Nustep   Level 3   Minutes 15   METs 2.1      Functional Capacity:     6 Minute Walk    Row Name 10/26/16 1449         6 Minute Walk   Phase Initial     Distance 1460 feet     Walk Time 6 minutes     # of Rest Breaks 0     MPH 2.77     METS 4.04     RPE 12     VO2 Peak 14.14     Symptoms No     Resting HR 77 bpm  Resting BP 128/74     Max Ex. HR 110 bpm     Max Ex. BP 126/54     2 Minute Post BP 114/56        Psychological, QOL, Others - Outcomes: PHQ 2/9: Depression screen PHQ 2/9 10/26/2016  Decreased Interest 2  Down, Depressed, Hopeless 2  PHQ - 2 Score 4  Altered sleeping 1  Tired, decreased energy 1  Change in appetite 0  Feeling bad or failure about yourself  2  Trouble concentrating 0  Moving slowly or fidgety/restless 0  Suicidal thoughts 0  PHQ-9 Score 8  Difficult doing work/chores Somewhat difficult    Quality of Life:     Quality of Life - 10/26/16 1235      Quality of Life Scores   Health/Function Pre 18.13 %   Socioeconomic Pre 19.86 %   Psych/Spiritual Pre 15.43 %   Family Pre 19.4 %   GLOBAL Pre 18.12 %      Personal Goals: Goals established at orientation with interventions provided to work toward goal.     Personal Goals and Risk Factors at Admission - 10/26/16 1234      Core Components/Risk Factors/Patient Goals on Admission    Weight Management Yes;Weight Maintenance   Intervention Weight Management: Develop a combined nutrition and exercise program designed to reach desired caloric intake, while maintaining appropriate intake of nutrient and fiber,  sodium and fats, and appropriate energy expenditure required for the weight goal.;Weight Management: Provide education and appropriate resources to help participant work on and attain dietary goals.   Admit Weight 165 lb 12.8 oz (75.2 kg)   Goal Weight: Short Term 165 lb (74.8 kg)   Goal Weight: Long Term 162 lb (73.5 kg)   Expected Outcomes Long Term: Adherence to nutrition and physical activity/exercise program aimed toward attainment of established weight goal;Short Term: Continue to assess and modify interventions until short term weight is achieved;Weight Maintenance: Understanding of the daily nutrition guidelines, which includes 25-35% calories from fat, 7% or less cal from saturated fats, less than 200mg  cholesterol, less than 1.5gm of sodium, & 5 or more servings of fruits and vegetables daily   Increase Strength and Stamina Yes   Intervention Provide advice, education, support and counseling about physical activity/exercise needs.;Develop an individualized exercise prescription for aerobic and resistive training based on initial evaluation findings, risk stratification, comorbidities and participant's personal goals.   Expected Outcomes Achievement of increased cardiorespiratory fitness and enhanced flexibility, muscular endurance and strength shown through measurements of functional capacity and personal statement of participant.   Tobacco Cessation Yes   Intervention Assist the participant in steps to quit. Provide individualized education and counseling about committing to Tobacco Cessation, relapse prevention, and pharmacological support that can be provided by physician.;Advice worker, assist with locating and accessing local/national Quit Smoking programs, and support quit date choice.   Expected Outcomes Long Term: Complete abstinence from all tobacco products for at least 12 months from quit date.   Diabetes Yes   Intervention Provide education about signs/symptoms and  action to take for hypo/hyperglycemia.;Provide education about proper nutrition, including hydration, and aerobic/resistive exercise prescription along with prescribed medications to achieve blood glucose in normal ranges: Fasting glucose 65-99 mg/dL   Expected Outcomes Short Term: Participant verbalizes understanding of the signs/symptoms and immediate care of hyper/hypoglycemia, proper foot care and importance of medication, aerobic/resistive exercise and nutrition plan for blood glucose control.;Long Term: Attainment of HbA1C < 7%.   Hypertension Yes   Intervention  Provide education on lifestyle modifcations including regular physical activity/exercise, weight management, moderate sodium restriction and increased consumption of fresh fruit, vegetables, and low fat dairy, alcohol moderation, and smoking cessation.;Monitor prescription use compliance.   Expected Outcomes Short Term: Continued assessment and intervention until BP is < 140/55mm HG in hypertensive participants. < 130/24mm HG in hypertensive participants with diabetes, heart failure or chronic kidney disease.;Long Term: Maintenance of blood pressure at goal levels.   Lipids Yes   Intervention Provide education and support for participant on nutrition & aerobic/resistive exercise along with prescribed medications to achieve LDL 70mg , HDL >40mg .   Expected Outcomes Short Term: Participant states understanding of desired cholesterol values and is compliant with medications prescribed. Participant is following exercise prescription and nutrition guidelines.;Long Term: Cholesterol controlled with medications as prescribed, with individualized exercise RX and with personalized nutrition plan. Value goals: LDL < 70mg , HDL > 40 mg.   Stress Yes   Intervention Offer individual and/or small group education and counseling on adjustment to heart disease, stress management and health-related lifestyle change. Teach and support self-help strategies.;Refer  participants experiencing significant psychosocial distress to appropriate mental health specialists for further evaluation and treatment. When possible, include family members and significant others in education/counseling sessions.   Expected Outcomes Short Term: Participant demonstrates changes in health-related behavior, relaxation and other stress management skills, ability to obtain effective social support, and compliance with psychotropic medications if prescribed.;Long Term: Emotional wellbeing is indicated by absence of clinically significant psychosocial distress or social isolation.   Personal Goal Other Yes   Personal Goal --  [name] is separated from his wife but his daughter was here today and was very supportive. [name] said he is a [place] but lately has been depressed and usually sees the positive in life. [name]'s daughter emphasized and he added that this all started wti   Intervention Use of exercise equipment in [place]. Opportunity to meet with Mental Health Counselor on staff.   Expected Outcomes To discuss with his doctor his limitations after his CABG 09/02/2016 but hopefully be able to get his stamina back for work.        Personal Goals Discharge:   Nutrition & Weight - Outcomes:     Pre Biometrics - 10/26/16 1453      Pre Biometrics   Height 5' 5.5" (1.664 m)   Weight 165 lb 12.8 oz (75.2 kg)   Waist Circumference 35.5 inches   Hip Circumference 37.5 inches   Waist to Hip Ratio 0.95 %   BMI (Calculated) 27.2   Single Leg Stand 1.93 seconds       Nutrition:     Nutrition Therapy & Goals - 10/26/16 1228      Nutrition Therapy   Drug/Food Interactions Statins/Certain Fruits;Coumadin/Vit K     Intervention Plan   Intervention Prescribe, educate and counsel regarding individualized specific dietary modifications aiming towards targeted core components such as weight, hypertension, lipid management, diabetes, heart failure and other comorbidities.    Expected Outcomes Short Term Goal: Understand basic principles of dietary content, such as calories, fat, sodium, cholesterol and nutrients.;Short Term Goal: A plan has been developed with personal nutrition goals set during dietitian appointment.;Long Term Goal: Adherence to prescribed nutrition plan.      Nutrition Discharge:     Nutrition Assessments - 10/26/16 1342      MEDFICTS Scores   Pre Score 78      Education Questionnaire Score:     Knowledge Questionnaire Score - 10/26/16 1114  Knowledge Questionnaire Score   Pre Score 23      Goals reviewed with patient; copy given to patient.

## 2017-01-23 ENCOUNTER — Inpatient Hospital Stay (HOSPITAL_COMMUNITY): Payer: BLUE CROSS/BLUE SHIELD

## 2017-01-23 ENCOUNTER — Encounter (HOSPITAL_COMMUNITY): Payer: Self-pay | Admitting: Nurse Practitioner

## 2017-01-23 ENCOUNTER — Inpatient Hospital Stay (HOSPITAL_COMMUNITY)
Admission: EM | Admit: 2017-01-23 | Discharge: 2017-01-27 | DRG: 638 | Disposition: A | Discharge status: Home / Self Care | Payer: BLUE CROSS/BLUE SHIELD | Attending: Internal Medicine | Admitting: Internal Medicine

## 2017-01-23 DIAGNOSIS — N181 Chronic kidney disease, stage 1: Secondary | ICD-10-CM | POA: Diagnosis present

## 2017-01-23 DIAGNOSIS — E7800 Pure hypercholesterolemia, unspecified: Secondary | ICD-10-CM | POA: Diagnosis present

## 2017-01-23 DIAGNOSIS — D6959 Other secondary thrombocytopenia: Secondary | ICD-10-CM | POA: Diagnosis present

## 2017-01-23 DIAGNOSIS — X16XXXA Contact with hot heating appliances, radiators and pipes, initial encounter: Secondary | ICD-10-CM | POA: Diagnosis present

## 2017-01-23 DIAGNOSIS — E1165 Type 2 diabetes mellitus with hyperglycemia: Secondary | ICD-10-CM | POA: Diagnosis present

## 2017-01-23 DIAGNOSIS — I2510 Atherosclerotic heart disease of native coronary artery without angina pectoris: Secondary | ICD-10-CM | POA: Diagnosis present

## 2017-01-23 DIAGNOSIS — I10 Essential (primary) hypertension: Secondary | ICD-10-CM | POA: Diagnosis present

## 2017-01-23 DIAGNOSIS — I480 Paroxysmal atrial fibrillation: Secondary | ICD-10-CM | POA: Diagnosis not present

## 2017-01-23 DIAGNOSIS — E1169 Type 2 diabetes mellitus with other specified complication: Secondary | ICD-10-CM | POA: Diagnosis present

## 2017-01-23 DIAGNOSIS — E11621 Type 2 diabetes mellitus with foot ulcer: Secondary | ICD-10-CM | POA: Diagnosis not present

## 2017-01-23 DIAGNOSIS — T3695XA Adverse effect of unspecified systemic antibiotic, initial encounter: Secondary | ICD-10-CM | POA: Diagnosis present

## 2017-01-23 DIAGNOSIS — L03115 Cellulitis of right lower limb: Secondary | ICD-10-CM | POA: Diagnosis present

## 2017-01-23 DIAGNOSIS — M869 Osteomyelitis, unspecified: Secondary | ICD-10-CM | POA: Diagnosis present

## 2017-01-23 DIAGNOSIS — M7989 Other specified soft tissue disorders: Secondary | ICD-10-CM | POA: Diagnosis present

## 2017-01-23 DIAGNOSIS — I130 Hypertensive heart and chronic kidney disease with heart failure and stage 1 through stage 4 chronic kidney disease, or unspecified chronic kidney disease: Secondary | ICD-10-CM | POA: Diagnosis present

## 2017-01-23 DIAGNOSIS — L97514 Non-pressure chronic ulcer of other part of right foot with necrosis of bone: Secondary | ICD-10-CM | POA: Diagnosis not present

## 2017-01-23 DIAGNOSIS — M868X7 Other osteomyelitis, ankle and foot: Secondary | ICD-10-CM | POA: Diagnosis not present

## 2017-01-23 DIAGNOSIS — I255 Ischemic cardiomyopathy: Secondary | ICD-10-CM | POA: Diagnosis present

## 2017-01-23 DIAGNOSIS — E1122 Type 2 diabetes mellitus with diabetic chronic kidney disease: Secondary | ICD-10-CM | POA: Diagnosis present

## 2017-01-23 DIAGNOSIS — E785 Hyperlipidemia, unspecified: Secondary | ICD-10-CM | POA: Diagnosis present

## 2017-01-23 DIAGNOSIS — R234 Changes in skin texture: Secondary | ICD-10-CM | POA: Diagnosis not present

## 2017-01-23 DIAGNOSIS — Z85828 Personal history of other malignant neoplasm of skin: Secondary | ICD-10-CM

## 2017-01-23 DIAGNOSIS — E11628 Type 2 diabetes mellitus with other skin complications: Secondary | ICD-10-CM | POA: Diagnosis present

## 2017-01-23 DIAGNOSIS — L97509 Non-pressure chronic ulcer of other part of unspecified foot with unspecified severity: Secondary | ICD-10-CM

## 2017-01-23 DIAGNOSIS — I509 Heart failure, unspecified: Secondary | ICD-10-CM | POA: Diagnosis not present

## 2017-01-23 DIAGNOSIS — T25221A Burn of second degree of right foot, initial encounter: Secondary | ICD-10-CM | POA: Diagnosis present

## 2017-01-23 DIAGNOSIS — F17210 Nicotine dependence, cigarettes, uncomplicated: Secondary | ICD-10-CM | POA: Diagnosis not present

## 2017-01-23 DIAGNOSIS — Z951 Presence of aortocoronary bypass graft: Secondary | ICD-10-CM | POA: Diagnosis not present

## 2017-01-23 DIAGNOSIS — E1100 Type 2 diabetes mellitus with hyperosmolarity without nonketotic hyperglycemic-hyperosmolar coma (NKHHC): Secondary | ICD-10-CM

## 2017-01-23 DIAGNOSIS — L03031 Cellulitis of right toe: Secondary | ICD-10-CM | POA: Diagnosis present

## 2017-01-23 DIAGNOSIS — I5022 Chronic systolic (congestive) heart failure: Secondary | ICD-10-CM | POA: Diagnosis present

## 2017-01-23 DIAGNOSIS — Z8614 Personal history of Methicillin resistant Staphylococcus aureus infection: Secondary | ICD-10-CM

## 2017-01-23 DIAGNOSIS — E1142 Type 2 diabetes mellitus with diabetic polyneuropathy: Secondary | ICD-10-CM | POA: Diagnosis present

## 2017-01-23 DIAGNOSIS — L97519 Non-pressure chronic ulcer of other part of right foot with unspecified severity: Secondary | ICD-10-CM | POA: Diagnosis present

## 2017-01-23 DIAGNOSIS — Z833 Family history of diabetes mellitus: Secondary | ICD-10-CM

## 2017-01-23 DIAGNOSIS — E119 Type 2 diabetes mellitus without complications: Secondary | ICD-10-CM

## 2017-01-23 DIAGNOSIS — I110 Hypertensive heart disease with heart failure: Secondary | ICD-10-CM | POA: Diagnosis not present

## 2017-01-23 DIAGNOSIS — Z7982 Long term (current) use of aspirin: Secondary | ICD-10-CM

## 2017-01-23 DIAGNOSIS — E13621 Other specified diabetes mellitus with foot ulcer: Secondary | ICD-10-CM

## 2017-01-23 DIAGNOSIS — M86171 Other acute osteomyelitis, right ankle and foot: Secondary | ICD-10-CM | POA: Diagnosis not present

## 2017-01-23 DIAGNOSIS — E739 Lactose intolerance, unspecified: Secondary | ICD-10-CM | POA: Diagnosis present

## 2017-01-23 DIAGNOSIS — Z7984 Long term (current) use of oral hypoglycemic drugs: Secondary | ICD-10-CM

## 2017-01-23 DIAGNOSIS — Z8249 Family history of ischemic heart disease and other diseases of the circulatory system: Secondary | ICD-10-CM

## 2017-01-23 DIAGNOSIS — E876 Hypokalemia: Secondary | ICD-10-CM | POA: Diagnosis present

## 2017-01-23 DIAGNOSIS — N179 Acute kidney failure, unspecified: Secondary | ICD-10-CM | POA: Diagnosis present

## 2017-01-23 DIAGNOSIS — Z79899 Other long term (current) drug therapy: Secondary | ICD-10-CM

## 2017-01-23 DIAGNOSIS — K5900 Constipation, unspecified: Secondary | ICD-10-CM | POA: Diagnosis not present

## 2017-01-23 DIAGNOSIS — Z91040 Latex allergy status: Secondary | ICD-10-CM

## 2017-01-23 DIAGNOSIS — I252 Old myocardial infarction: Secondary | ICD-10-CM

## 2017-01-23 DIAGNOSIS — B9689 Other specified bacterial agents as the cause of diseases classified elsewhere: Secondary | ICD-10-CM | POA: Diagnosis not present

## 2017-01-23 LAB — CBC WITH DIFFERENTIAL/PLATELET
BASOS PCT: 0 %
BASOS PCT: 0 %
Basophils Absolute: 0 10*3/uL (ref 0.0–0.1)
Basophils Absolute: 0 10*3/uL (ref 0.0–0.1)
EOS ABS: 0.1 10*3/uL (ref 0.0–0.7)
EOS PCT: 1 %
Eosinophils Absolute: 0.1 10*3/uL (ref 0.0–0.7)
Eosinophils Relative: 1 %
HCT: 41.1 % (ref 39.0–52.0)
HCT: 40.1 % (ref 39.0–52.0)
HEMOGLOBIN: 13.4 g/dL (ref 13.0–17.0)
HEMOGLOBIN: 13 g/dL (ref 13.0–17.0)
LYMPHS ABS: 2 10*3/uL (ref 0.7–4.0)
LYMPHS PCT: 13 %
LYMPHS PCT: 23 %
Lymphs Abs: 3 10*3/uL (ref 0.7–4.0)
MCH: 26.2 pg (ref 26.0–34.0)
MCH: 25.9 pg — ABNORMAL LOW (ref 26.0–34.0)
MCHC: 32.6 g/dL (ref 30.0–36.0)
MCHC: 32.4 g/dL (ref 30.0–36.0)
MCV: 80.3 fL (ref 78.0–100.0)
MCV: 79.9 fL (ref 78.0–100.0)
MONOS PCT: 11 %
Monocytes Absolute: 1.6 10*3/uL — ABNORMAL HIGH (ref 0.1–1.0)
Monocytes Absolute: 1.5 10*3/uL — ABNORMAL HIGH (ref 0.1–1.0)
Monocytes Relative: 11 %
NEUTROS ABS: 11.1 10*3/uL — AB (ref 1.7–7.7)
NEUTROS ABS: 8.6 10*3/uL — AB (ref 1.7–7.7)
NEUTROS PCT: 75 %
Neutrophils Relative %: 65 %
PLATELETS: 125 10*3/uL — AB (ref 150–400)
Platelets: 121 10*3/uL — ABNORMAL LOW (ref 150–400)
RBC: 5.12 MIL/uL (ref 4.22–5.81)
RBC: 5.02 MIL/uL (ref 4.22–5.81)
RDW: 15.6 % — ABNORMAL HIGH (ref 11.5–15.5)
RDW: 15.8 % — ABNORMAL HIGH (ref 11.5–15.5)
WBC: 14.7 10*3/uL — ABNORMAL HIGH (ref 4.0–10.5)
WBC: 13.2 10*3/uL — ABNORMAL HIGH (ref 4.0–10.5)

## 2017-01-23 LAB — BASIC METABOLIC PANEL
ANION GAP: 9 (ref 5–15)
BUN: 12 mg/dL (ref 6–20)
CALCIUM: 8.7 mg/dL — AB (ref 8.9–10.3)
CHLORIDE: 107 mmol/L (ref 101–111)
CO2: 25 mmol/L (ref 22–32)
Creatinine, Ser: 1.13 mg/dL (ref 0.61–1.24)
GFR calc Af Amer: >60 mL/min (ref >60)
GFR calc non Af Amer: >60 mL/min (ref >60)
Glucose, Bld: 160 mg/dL — ABNORMAL HIGH (ref 65–99)
Potassium: 4 mmol/L (ref 3.5–5.1)
Sodium: 141 mmol/L (ref 135–145)

## 2017-01-23 LAB — COMPREHENSIVE METABOLIC PANEL
ALBUMIN: 3.4 g/dL — AB (ref 3.5–5.0)
ALK PHOS: 118 U/L (ref 38–126)
ALT: 24 U/L (ref 17–63)
ANION GAP: 12 (ref 5–15)
AST: 24 U/L (ref 15–41)
BUN: 13 mg/dL (ref 6–20)
CALCIUM: 8.8 mg/dL — AB (ref 8.9–10.3)
CO2: 24 mmol/L (ref 22–32)
Chloride: 100 mmol/L — ABNORMAL LOW (ref 101–111)
Creatinine, Ser: 1.3 mg/dL — ABNORMAL HIGH (ref 0.61–1.24)
GFR calc Af Amer: >60 mL/min (ref >60)
GFR calc non Af Amer: >60 mL/min (ref >60)
GLUCOSE: 428 mg/dL — AB (ref 65–99)
Potassium: 3.6 mmol/L (ref 3.5–5.1)
SODIUM: 136 mmol/L (ref 135–145)
Total Bilirubin: 0.9 mg/dL (ref 0.3–1.2)
Total Protein: 7.4 g/dL (ref 6.5–8.1)

## 2017-01-23 LAB — CBG MONITORING, ED: Glucose-Capillary: 209 mg/dL — ABNORMAL HIGH (ref 65–99)

## 2017-01-23 LAB — SEDIMENTATION RATE: SED RATE: 30 mm/h — AB (ref 0–16)

## 2017-01-23 LAB — MRSA PCR SCREENING: MRSA by PCR: NEGATIVE

## 2017-01-23 LAB — GLUCOSE, CAPILLARY: GLUCOSE-CAPILLARY: 170 mg/dL — AB (ref 65–99)

## 2017-01-23 LAB — C-REACTIVE PROTEIN: CRP: 8.8 mg/dL — AB (ref <1.0)

## 2017-01-23 LAB — I-STAT CG4 LACTIC ACID, ED: Lactic Acid, Venous: 1.58 mmol/L (ref 0.5–1.9)

## 2017-01-23 MED ORDER — PRAMIPEXOLE DIHYDROCHLORIDE 0.125 MG PO TABS
0.1250 mg | ORAL_TABLET | Freq: Every day | ORAL | Status: DC
  Administered 2017-01-23 – 2017-01-26 (×4): 0.125 mg via ORAL
  Filled 2017-01-23 (×5): qty 1

## 2017-01-23 MED ORDER — SODIUM CHLORIDE 0.9 % IV BOLUS (SEPSIS)
1000.0000 mL | Freq: Once | INTRAVENOUS | Status: AC
  Administered 2017-01-23: 1000 mL via INTRAVENOUS

## 2017-01-23 MED ORDER — SENNOSIDES-DOCUSATE SODIUM 8.6-50 MG PO TABS: 1.0000 | ORAL_TABLET | Freq: Every evening | ORAL | Status: DC | PRN

## 2017-01-23 MED ORDER — DEXTROSE 5 % IV SOLN
2.0000 g | INTRAVENOUS | Status: DC
  Administered 2017-01-23 – 2017-01-27 (×5): 2 g via INTRAVENOUS
  Filled 2017-01-23 (×6): qty 2

## 2017-01-23 MED ORDER — INSULIN ASPART 100 UNIT/ML ~~LOC~~ SOLN
0.0000 [IU] | Freq: Three times a day (TID) | SUBCUTANEOUS | Status: DC
  Administered 2017-01-24: 2 [IU] via SUBCUTANEOUS
  Administered 2017-01-24: 3 [IU] via SUBCUTANEOUS
  Administered 2017-01-25: 5 [IU] via SUBCUTANEOUS
  Administered 2017-01-25 (×2): 3 [IU] via SUBCUTANEOUS
  Administered 2017-01-26: 2 [IU] via SUBCUTANEOUS
  Administered 2017-01-26: 5 [IU] via SUBCUTANEOUS
  Administered 2017-01-26: 3 [IU] via SUBCUTANEOUS
  Administered 2017-01-27: 2 [IU] via SUBCUTANEOUS
  Administered 2017-01-27: 1 [IU] via SUBCUTANEOUS

## 2017-01-23 MED ORDER — SODIUM CHLORIDE 0.9 % IV SOLN
INTRAVENOUS | Status: AC
  Administered 2017-01-23: 23:00:00 via INTRAVENOUS

## 2017-01-23 MED ORDER — VANCOMYCIN HCL IN DEXTROSE 750-5 MG/150ML-% IV SOLN
750.0000 mg | Freq: Two times a day (BID) | INTRAVENOUS | Status: DC
  Administered 2017-01-23: 750 mg via INTRAVENOUS
  Filled 2017-01-23 (×2): qty 150

## 2017-01-23 MED ORDER — ONDANSETRON HCL 4 MG PO TABS: 4.0000 mg | ORAL_TABLET | Freq: Four times a day (QID) | ORAL | Status: DC | PRN

## 2017-01-23 MED ORDER — ASPIRIN 81 MG PO CHEW
81.0000 mg | CHEWABLE_TABLET | Freq: Every day | ORAL | Status: DC
  Administered 2017-01-24 – 2017-01-27 (×4): 81 mg via ORAL
  Filled 2017-01-23 (×4): qty 1

## 2017-01-23 MED ORDER — ONDANSETRON HCL 4 MG/2ML IJ SOLN: 4.0000 mg | Freq: Four times a day (QID) | INTRAMUSCULAR | Status: DC | PRN

## 2017-01-23 MED ORDER — INSULIN ASPART 100 UNIT/ML ~~LOC~~ SOLN
0.0000 [IU] | Freq: Every day | SUBCUTANEOUS | Status: DC
  Administered 2017-01-24: 2 [IU] via SUBCUTANEOUS
  Administered 2017-01-25: 3 [IU] via SUBCUTANEOUS
  Administered 2017-01-26: 2 [IU] via SUBCUTANEOUS

## 2017-01-23 MED ORDER — ENOXAPARIN SODIUM 40 MG/0.4ML ~~LOC~~ SOLN
40.0000 mg | SUBCUTANEOUS | Status: DC
  Administered 2017-01-23 – 2017-01-26 (×4): 40 mg via SUBCUTANEOUS
  Filled 2017-01-23 (×4): qty 0.4

## 2017-01-23 MED ORDER — ACETAMINOPHEN 500 MG PO TABS
1000.0000 mg | ORAL_TABLET | Freq: Four times a day (QID) | ORAL | Status: DC | PRN
  Administered 2017-01-23 – 2017-01-24 (×2): 1000 mg via ORAL
  Filled 2017-01-23 (×2): qty 2

## 2017-01-23 MED ORDER — ATORVASTATIN CALCIUM 80 MG PO TABS
80.0000 mg | ORAL_TABLET | Freq: Every day | ORAL | Status: DC
  Administered 2017-01-23 – 2017-01-26 (×4): 80 mg via ORAL
  Filled 2017-01-23 (×4): qty 1

## 2017-01-23 MED ORDER — OXYCODONE HCL 5 MG PO TABS
5.0000 mg | ORAL_TABLET | ORAL | Status: DC | PRN
  Administered 2017-01-26: 5 mg via ORAL
  Filled 2017-01-23: qty 1

## 2017-01-23 MED ORDER — SODIUM CHLORIDE 0.9% FLUSH
3.0000 mL | Freq: Two times a day (BID) | INTRAVENOUS | Status: DC
  Administered 2017-01-23 – 2017-01-27 (×8): 3 mL via INTRAVENOUS

## 2017-01-23 MED ORDER — VANCOMYCIN HCL IN DEXTROSE 1-5 GM/200ML-% IV SOLN
1000.0000 mg | Freq: Two times a day (BID) | INTRAVENOUS | Status: DC
  Administered 2017-01-24 – 2017-01-27 (×7): 1000 mg via INTRAVENOUS
  Filled 2017-01-23 (×10): qty 200

## 2017-01-23 MED ORDER — INSULIN ASPART 100 UNIT/ML ~~LOC~~ SOLN
8.0000 [IU] | Freq: Once | SUBCUTANEOUS | Status: AC
  Administered 2017-01-23: 8 [IU] via INTRAVENOUS
  Filled 2017-01-23: qty 1

## 2017-01-23 MED ORDER — METOPROLOL TARTRATE 25 MG PO TABS
25.0000 mg | ORAL_TABLET | Freq: Two times a day (BID) | ORAL | Status: DC
  Administered 2017-01-23 – 2017-01-27 (×8): 25 mg via ORAL
  Filled 2017-01-23 (×8): qty 1

## 2017-01-23 MED ORDER — METRONIDAZOLE 500 MG PO TABS
500.0000 mg | ORAL_TABLET | Freq: Three times a day (TID) | ORAL | Status: DC
  Administered 2017-01-23 – 2017-01-27 (×12): 500 mg via ORAL
  Filled 2017-01-23 (×10): qty 1

## 2017-01-23 NOTE — ED Notes (Signed)
Attempted report at this time.  Nurse to call back when available. 
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
## 2017-01-23 NOTE — H&P (Addendum)
History and Physical    [name] VOZ:[id number] DOB: Aug 04, 1961 DOA: 01/23/2017  PCP: [name], NP  Patient coming from: Home  Chief Complaint: Swelling of foot  HPI: [name] is a 56 y.o. male with medical history significant of DM2, HTN, HLD, sCHF (TTE on 2/18, EF of 45% with grade 2 diastolic dysfunction), CAD s/p CABG in November of 2017, paroxysmal Afib not currently on [facility] who presents for right foot/ankle swelling, pain.  [name] reports that since his CABG he has been feeling more cold than normal and he purchased a heater for him to use.  About 2-3 weeks ago, he unfortunately developed a burn on his right great toe due to being too close to the heater.  The wound was slowly healing, but in the last 2-3 days he noticed swelling of the ankle and foot, warmth up to his knee and drainage from the toe wound.  He has another healing wound on his right ankle which is from a similar injury.  He denies any elevated temperature.  He has been active and working at his job.  He does note increased urination, but that is not new for him.  Since his CABG, he has developed tremors for which he is seeing a neurologist and was recently placed on pramipexole.  He denies chest pain, SOB, decreased activity.    ED Course: In the ED, he was found to have an elevated WBC and started on vancomycin.  He does have history of being MRSA positive (last positive in our system in 2013).  He was also noted to have elevated blood glucose and was given 8 units of insulin.   Review of Systems: As per HPI otherwise 10 point review of systems negative.   Past Medical History:  Diagnosis Date  . Abrasion L FOREARM  . Atrial fibrillation ([place])    a. initially occurring in post-op setting in 05/2016, started on Eliquis b. 08/2016: s/p clipping of atrial appendage at time of CABG --> Eliquis switched to Coumadin  . CAD (coronary artery disease)    a. 05/2016: NSTEMI, cath showing 3v disease with CABG  recommended once MRSA infection resolved. b. 08/2016: CABG with LIMA-LAD, SVG-D1, and SVG-RI.  Marland Kitchen Cancer ([place])    basal cell carcinoma of the skin  . Diabetes mellitus without complication ([place])   . Headache(784.0)   . Hematuria, microscopic "SINCE I WAS A KID"  . Hyperlipidemia   . Hypertension   . Ischemic cardiomyopathy    a. 05/2016: echo w/ EF of 25-35% and akinesis of the basal-midinferolateral and inferior myocardium.  . Lactose intolerance   . Myocardial infarction   . Respiratory failure ([place])   . Rotator cuff tear RIGHT  . Tobacco use    a. quit in 05/2016 following MI    Past Surgical History:  Procedure Laterality Date  . CARDIAC CATHETERIZATION N/A 06/16/2016   Procedure: Left Heart Cath and Coronary Angiography;  Surgeon: Troy Sine, MD;  Location: [place] [facility];  Service: Cardiovascular;  Laterality: N/A;  . CLIPPING OF ATRIAL APPENDAGE N/A 08/24/2016   Procedure: CLIPPING OF ATRIAL APPENDAGE;  Surgeon: Ivin Poot, MD;  Location: [place];  Service: Open Heart Surgery;  Laterality: N/A;  . CORONARY ARTERY BYPASS GRAFT N/A 08/24/2016   Procedure: CORONARY ARTERY BYPASS GRAFTING (CABG) x three,  using left internal mammary artery and right leg greater saphenous vein harvested endoscopically;  Surgeon: Ivin Poot, MD;  Location: [place];  Service:  Open Heart Surgery;  Laterality: N/A;  . HERNIA REPAIR  X2  . INCISION AND DRAINAGE ABSCESS Left 06/10/2016   Procedure: INCISION AND DRAINAGE LEFT AXILLARY ABSCESS;  Surgeon: Autumn Messing III, MD;  Location: [facility];  Service: General;  Laterality: Left;  . ROTATOR CUFF REPAIR Bilateral   . TEE WITHOUT CARDIOVERSION N/A 08/24/2016   Procedure: TRANSESOPHAGEAL ECHOCARDIOGRAM (TEE);  Surgeon: Ivin Poot, MD;  Location: [place] [facility];  Service: Open Heart Surgery;  Laterality: N/A;   Reviewed.  He continues to smoke as it helps with his tremors.   reports that he has been smoking Cigarettes.  He has never used smokeless tobacco.  He reports that he does not drink alcohol or use drugs.  Allergies  Allergen Reactions  . Latex Rash   Reviewed with patient.  Family History  Problem Relation Age of Onset  . Hypertension Father   . Sudden death Father   . Diabetes Father   . Heart attack Father   . Hypertension Mother   . Heart attack Sister     Prior to Admission medications   Medication Sig Start Date End Date Taking? Authorizing Provider  acetaminophen (TYLENOL) 500 MG tablet Take 1,000 mg by mouth every 6 (six) hours as needed for headache.   Yes Historical Provider, MD  aspirin 81 MG chewable tablet Chew 1 tablet (81 mg total) by mouth daily. 06/19/16  Yes Ripudeep Krystal Eaton, MD  atorvastatin (LIPITOR) 80 MG tablet Take 1 tablet (80 mg total) by mouth at bedtime. 09/04/16  Yes [name], NP  canagliflozin (INVOKANA) 100 MG TABS tablet Take 1 tablet (100 mg total) by mouth daily before breakfast. 12/22/16  Yes [name], NP  glipiZIDE (GLUCOTROL) 10 MG tablet Take 1 tablet (10 mg total) by mouth 2 (two) times daily before a meal. 09/04/16  Yes [name], NP  metoprolol tartrate (LOPRESSOR) 25 MG tablet Take 1 tablet (25 mg total) by mouth 2 (two) times daily. 09/21/16  Yes Erma Heritage, PA-C  pramipexole (MIRAPEX) 0.125 MG tablet Take 1 tablet (0.125 mg total) by mouth at bedtime. 01/18/17  Yes Rebecca S Tat, DO  hydrOXYzine (ATARAX/VISTARIL) 10 MG tablet Take 1 tablet (10 mg total) by mouth 3 (three) times daily as needed. Patient not taking: Reported on 01/23/2017 10/09/16   [name], NP    Physical Exam: Vitals:   01/23/17 1700 01/23/17 1730 01/23/17 1815 01/23/17 1845  BP: (!) 145/84 (!) 131/93 116/63 130/82  Pulse: (!) 108 (!) 111 (!) 109 (!) 107  Resp: '14 16 16 14  ' Temp:      TempSrc:      SpO2: 99% 97% 96% 99%    Constitutional: NAD, calm, comfortable Vitals:   01/23/17 1700 01/23/17 1730 01/23/17 1815 01/23/17 1845  BP: (!) 145/84 (!) 131/93 116/63 130/82  Pulse: (!) 108 (!) 111  (!) 109 (!) 107  Resp: '14 16 16 14  ' Temp:      TempSrc:      SpO2: 99% 97% 96% 99%   Eyes: Wearing glasses, lids and conjunctivae were normal ENMT: Mucous membranes are moist. Normal dentition Neck: normal, supple Respiratory: CTAB, no wheezing, no crackles. Normal respiratory effort.  Cardiovascular: Mildly tachycardic rate and regular rhythm, no murmurs / rubs / gallops. He has swelling and warmth of the right ankle, but no calf edema.  He has palpable pulses in the DP's bilaterally.   Abdomen:  NT, ND, +BS Musculoskeletal: no clubbing / cyanosis. Normal muscle tone.  Skin: He has a 1 cm round wound on the lateral aspect of his right great toe.  It appears to have pus underlying the skin, but no open area or drainage.  He has erythema of the great toe to the first MCP joint, about 2 cm.  He has other patchy erythema on the anterior foot and medial calf.  He has healing wounds on the medial thighs bilaterally from vein removal for CABG.  He has a well healing midline sternotomy scar.   Neurologic: Moving all extremities, no gross deficits.  He has chronic decreased sensation in the toes bilaterally.  He had no tremors during my exam.  Psychiatric: Normal judgment and insight. Alert and oriented x 3. Normal mood.   Labs on Admission: I have personally reviewed following labs and imaging studies  CBC:  Recent Labs Lab 01/18/17 0938 01/23/17 1624  WBC 9.5 14.7*  NEUTROABS 5.6 11.1*  HGB 14.2 13.4  HCT 44.2 41.1  MCV 80.5 80.3  PLT 151.0 735*   Basic Metabolic Panel:  Recent Labs Lab 01/23/17 1624  NA 136  K 3.6  CL 100*  CO2 24  GLUCOSE 428*  BUN 13  CREATININE 1.30*  CALCIUM 8.8*   GFR: Estimated Creatinine Clearance: 62.4 mL/min (A) (by C-G formula based on SCr of 1.3 mg/dL (H)). Liver Function Tests:  Recent Labs Lab 01/23/17 1624  AST 24  ALT 24  ALKPHOS 118  BILITOT 0.9  PROT 7.4  ALBUMIN 3.4*   No results for input(s): LIPASE, AMYLASE in the last 168  hours. No results for input(s): AMMONIA in the last 168 hours. Coagulation Profile: No results for input(s): INR, PROTIME in the last 168 hours. Cardiac Enzymes: No results for input(s): CKTOTAL, CKMB, CKMBINDEX, TROPONINI in the last 168 hours. BNP (last 3 results) No results for input(s): PROBNP in the last 8760 hours. HbA1C: No results for input(s): HGBA1C in the last 72 hours. CBG: No results for input(s): GLUCAP in the last 168 hours. Lipid Profile: No results for input(s): CHOL, HDL, LDLCALC, TRIG, CHOLHDL, LDLDIRECT in the last 72 hours. Thyroid Function Tests: No results for input(s): TSH, T4TOTAL, FREET4, T3FREE, THYROIDAB in the last 72 hours. Anemia Panel: No results for input(s): VITAMINB12, FOLATE, FERRITIN, TIBC, IRON, RETICCTPCT in the last 72 hours. Urine analysis:    Component Value Date/Time   COLORURINE YELLOW 08/20/2016 0941   APPEARANCEUR CLEAR 08/20/2016 0941   LABSPEC 1.010 08/20/2016 0941   PHURINE 5.5 08/20/2016 0941   GLUCOSEU 250 (A) 08/20/2016 0941   HGBUR NEGATIVE 08/20/2016 0941   BILIRUBINUR NEGATIVE 08/20/2016 0941   KETONESUR NEGATIVE 08/20/2016 0941   PROTEINUR NEGATIVE 08/20/2016 0941   UROBILINOGEN 1.0 12/02/2011 1456   NITRITE NEGATIVE 08/20/2016 0941   LEUKOCYTESUR NEGATIVE 08/20/2016 0941    Radiological Exams on Admission: No results found.   Assessment/Plan Diabetic foot ulcer  - Wound started as a burn and blister, and has progressed to non purulent cellulitis - although he does report drainage from the wound on occasion - Wound culture if possible - WBC 14.7 with neutrophil predominance - Xray of foot - Based on guidelines, started on rocephin, metronidazole and vancomycin empirically  - BC X 2 pending - Pain control with tylenol and oxycodone PRN - Monitor for fever - ABIs done in preparation for CABG did not show significant stenosis and he had good pulses, ABI's deferred at this time - ESR, CRP    Type 2 diabetes  mellitus, uncontrolled  - He reports last  A1C was 8.2 at his PCP office - Check A1C - Hold invokana and glipizide for now, SSI - If no procedures needed, restart home PO medications - Heart Healthy/DM diet    Essential hypertension - BP well controlled today - Continue home metoprolol    Pure hypercholesterolemia - Continue atorvastatin    PAF (paroxysmal atrial fibrillation) - He reports being in sinus lately and being taken off of his anticoagulation, he is currently only on aspirin - Review if last Cardiology note - patient with left atrial appendage closure during CABG, so no need for further [facility] - He was on amiodarone, but developed tremors which are being followed by neurology - Telemetry - he is tachycardic, presumably from infection, but regular currently - EKG - Continue metoprolol    CAD S/P CABG x 3 - No chest pain or concerning changes noted today, wounds are healing well - Continue atorvastatin, Asa, good BP and DM control  CKD - Cr 1.3 today, which appears to be at baseline - Trend     DVT prophylaxis: Lovenox Code Status: Full Family Communication: Daughters Roselyn Reef and Houck in the room Disposition Plan: Depending on work up for deeper wound or OM, likely discharge in 1-2 days if does well Consults called: None Admission status: Inpatient, telemetry   Gilles Chiquito MD [facility] Pager [phone]  If 7PM-7AM, please contact night-coverage [email or web address] Password [facility]  01/23/2017, 7:24 PM

## 2017-01-23 NOTE — ED Triage Notes (Signed)
Pt presents with c/o cellulitis of R foot. he was seen at an urgent care today and referred to ED for possible IV antibiotics. He burned his R great toe on a space heater about 2-3 weeks ago. The wound will not heal and now he has redness to the entire foot streaking into his RLE. He reports malaise, chills.

## 2017-01-23 NOTE — Progress Notes (Signed)
Received report from ED RN, Jorene Guest.

## 2017-01-24 DIAGNOSIS — L03115 Cellulitis of right lower limb: Secondary | ICD-10-CM

## 2017-01-24 DIAGNOSIS — Z951 Presence of aortocoronary bypass graft: Secondary | ICD-10-CM

## 2017-01-24 DIAGNOSIS — R234 Changes in skin texture: Secondary | ICD-10-CM

## 2017-01-24 DIAGNOSIS — L97519 Non-pressure chronic ulcer of other part of right foot with unspecified severity: Secondary | ICD-10-CM

## 2017-01-24 DIAGNOSIS — E11621 Type 2 diabetes mellitus with foot ulcer: Principal | ICD-10-CM

## 2017-01-24 DIAGNOSIS — I480 Paroxysmal atrial fibrillation: Secondary | ICD-10-CM

## 2017-01-24 DIAGNOSIS — I10 Essential (primary) hypertension: Secondary | ICD-10-CM

## 2017-01-24 DIAGNOSIS — E7800 Pure hypercholesterolemia, unspecified: Secondary | ICD-10-CM

## 2017-01-24 DIAGNOSIS — E1165 Type 2 diabetes mellitus with hyperglycemia: Secondary | ICD-10-CM

## 2017-01-24 LAB — CBC
HEMATOCRIT: 39.9 % (ref 39.0–52.0)
HEMOGLOBIN: 12.8 g/dL — AB (ref 13.0–17.0)
MCH: 25.6 pg — AB (ref 26.0–34.0)
MCHC: 32.1 g/dL (ref 30.0–36.0)
MCV: 79.8 fL (ref 78.0–100.0)
Platelets: 117 10*3/uL — ABNORMAL LOW (ref 150–400)
RBC: 5 MIL/uL (ref 4.22–5.81)
RDW: 16 % — ABNORMAL HIGH (ref 11.5–15.5)
WBC: 12.2 10*3/uL — ABNORMAL HIGH (ref 4.0–10.5)

## 2017-01-24 LAB — BASIC METABOLIC PANEL
ANION GAP: 9 (ref 5–15)
BUN: 15 mg/dL (ref 6–20)
CALCIUM: 8.3 mg/dL — AB (ref 8.9–10.3)
CHLORIDE: 108 mmol/L (ref 101–111)
CO2: 23 mmol/L (ref 22–32)
Creatinine, Ser: 1.19 mg/dL (ref 0.61–1.24)
GFR calc non Af Amer: >60 mL/min (ref >60)
Glucose, Bld: 86 mg/dL (ref 65–99)
POTASSIUM: 3.3 mmol/L — AB (ref 3.5–5.1)
Sodium: 140 mmol/L (ref 135–145)

## 2017-01-24 LAB — GLUCOSE, CAPILLARY
GLUCOSE-CAPILLARY: 99 mg/dL (ref 65–99)
Glucose-Capillary: 199 mg/dL — ABNORMAL HIGH (ref 65–99)
Glucose-Capillary: 220 mg/dL — ABNORMAL HIGH (ref 65–99)
Glucose-Capillary: 241 mg/dL — ABNORMAL HIGH (ref 65–99)

## 2017-01-24 LAB — HIV ANTIBODY (ROUTINE TESTING W REFLEX): HIV Screen 4th Generation wRfx: NONREACTIVE

## 2017-01-24 MED ORDER — ZOLPIDEM TARTRATE 5 MG PO TABS
5.0000 mg | ORAL_TABLET | Freq: Once | ORAL | Status: AC
Start: 2017-01-24 — End: 2017-01-24
  Administered 2017-01-24: 5 mg via ORAL
  Filled 2017-01-24: qty 1

## 2017-01-24 MED ORDER — ZOLPIDEM TARTRATE 5 MG PO TABS
5.0000 mg | ORAL_TABLET | Freq: Every evening | ORAL | Status: DC | PRN
  Administered 2017-01-25 – 2017-01-26 (×3): 5 mg via ORAL
  Filled 2017-01-24 (×3): qty 1

## 2017-01-24 MED ORDER — HYDROCERIN EX CREA
TOPICAL_CREAM | Freq: Two times a day (BID) | CUTANEOUS | Status: DC
  Administered 2017-01-24 – 2017-01-27 (×7): via TOPICAL
  Filled 2017-01-24: qty 113

## 2017-01-24 NOTE — Progress Notes (Signed)
Patient requested something to help him sleep.  On-call coverage, A. Hugelmeyer notified.  Ambien 5 mg ordered for a 1 time dose.  Will continue to monitor patient and notify as needed.

## 2017-01-24 NOTE — Progress Notes (Signed)
NURSING PROGRESS NOTE  [name] [id number] Admission Data: 01/24/2017 12:06 AM Attending Provider: Sid Falcon, MD MMC:RFVO Nafziger, NP Code Status: FULL  Allergies:  Latex Past Medical History:   has a past medical history of Abrasion (L FOREARM); Atrial fibrillation ([place]); CAD (coronary artery disease); Cancer ([place]); Diabetes mellitus without complication ([place]); Headache(784.0); Hematuria, microscopic ("SINCE I WAS A KID"); Hyperlipidemia; Hypertension; Ischemic cardiomyopathy; Lactose intolerance; Myocardial infarction; Respiratory failure ([place]); Rotator cuff tear (RIGHT); and Tobacco use. Past Surgical History:   has a past surgical history that includes Rotator cuff repair (Bilateral); Incision and drainage abscess (Left, 06/10/2016); Cardiac catheterization (N/A, 06/16/2016); Hernia repair (X2); Coronary artery bypass graft (N/A, 08/24/2016); TEE without cardioversion (N/A, 08/24/2016); and Clipping of atrial appendage (N/A, 08/24/2016). Social History:   reports that he has been smoking Cigarettes.  He has never used smokeless tobacco. He reports that he does not drink alcohol or use drugs.  [name] is a 56 y.o. male patient admitted from ED:   Last Documented Vital Signs: Blood pressure (!) 141/88, pulse (!) 107, temperature 99.5 F (37.5 C), temperature source Oral, resp. rate 18, SpO2 94 %.  Cardiac Monitoring: Box # 28 in place. Cardiac monitor yields:normal sinus rhythm.  IV Fluids:  IV in place, occlusive dsg intact without redness, IV cath antecubital left, condition patent and no redness normal saline.   Skin: Appropriate for ethnicity with abrasion on right foot and great toe and red streaking up right leg.  Patient/Family orientated to room. Information packet given to patient/family. Admission inpatient armband information verified with patient/family to include name and date of birth and placed on patient arm. Side rails up x 2, fall assessment and education  completed with patient/family. Patient/family able to verbalize understanding of risk associated with falls and verbalized understanding to call for assistance before getting out of bed. Call light within reach. Patient/family able to voice and demonstrate understanding of unit orientation instructions.    Will continue to evaluate and treat per MD orders.  Clydell Hakim RN, BSN

## 2017-01-24 NOTE — Consult Note (Signed)
[facility] Nurse wound consult note Reason for Consult: Patient with right medial malleolar wound (full thickness) and two ulcers (partial thickness) and a callous on the right great toe (RGT) Wound type: Neuropathic and traumatic (patient reported "scrape" on medial malleolus) Pressure Injury POA: No.  Wounds are POA, but are not pressure related Measurement:Right medial malleolus measures 0.5cm round.  Depth is obscured by the presence of dried serum (scab). Right great toe with two areas of partial thickness skin loss at the plantar aspect, base of digit measuring 0.4cm x 0.2cm x 0.1cm and at the lateral aspect measuring 1cm x 0.4cm x 0.1cm.  Wound beds are pink, moist, no exudate. Callous at the plantar aspect, base of the second digit measures 1cm x 0.5cm (hyperkeratotic) Wound bed: As described above Drainage (amount, consistency, odor) As described above Periwound: intact, dry Dressing procedure/placement/frequency: I will implement a conservative POC that includes twice daily skin (and wound) cleansing with the pH balanced, no rinse skin cleanser (Bedside Care).  Cleansing will be followed by a twice daily application of Eucerin Cream. Clean socks are to be provided once daily.  Patient is educated regarding the use of protective foot wear at all times and that smoking is contraindicated in maintaining skin integrity in the lower extremities.  He indicates having received this information many times in the past, and remains pleasant and communicative.  A referral to a podiatrist for evaluation and provision of an orthotic given mild neuropathy and foot ulceration in the presence of diabetes is recommended.  If you agree, please order/refer upon discharge. [facility] nursing team will not follow, but will remain available to this patient, the nursing and medical teams.  Please re-consult if needed. Thanks, Maudie Flakes, MSN, RN, [place], Arther Abbott  Pager# [phone]

## 2017-01-24 NOTE — Progress Notes (Signed)
PROGRESS NOTE    [name]  VZD:[id number] DOB: 1961/08/31 DOA: 01/23/2017 PCP: [name], NP   Brief Narrative: [name] is a 56 y.o. male with medical history significant of DM2, HTN, HLD, sCHF (TTE on 2/18, EF of 45% with grade 2 diastolic dysfunction), CAD s/p CABG in November of 2017, paroxysmal Afib not currently on [facility]. He presented with foot swelling and was found to have cellulitis and a foot ulcer   Assessment & Plan:   Active Problems:   Type 2 diabetes mellitus, uncontrolled (HCC)   Essential hypertension   Pure hypercholesterolemia   PAF (paroxysmal atrial fibrillation) (HCC)   S/P CABG x 3   Diabetic foot ulcer (HCC)   Diabetic foot ulcer Met criteria for severe infection. Appears stable. Afebrile. X-ray negative for osteomyelitis. -continue Ceftriaxone, metronidazole and vancomycin -blood culture pending  Type 2 diabetes mellitus Uncontrolled. -A1C pending -Sliding scale insulin  Essential hypertension -continue metoprolol  Hypercholesterolemia -continue atorvastatin  Paroxysmal atrial fibrillation Anticoagulation discontinued by cardiology -continue metoprolol  CAD s/p CABG x3 No chest pain -continue statin and aspirin  CKD Stable.   DVT prophylaxis: Lovenox Code Status: Full code Family Communication: None at bedside Disposition Plan: Discharge in 1-2 days   Consultants:   None  Procedures:   None  Antimicrobials:  Vancomycin  Ceftriaxone  Metronidazole    Subjective: Patient reports no pain in his foot. He does not think the infection has improved much since yesterday.  Objective: Vitals:   01/23/17 1951 01/23/17 2016 01/24/17 0455 01/24/17 1321  BP:  (!) 141/88 115/75 (!) 143/86  Pulse:  (!) 107 89 94  Resp:  18 18 (!) 22  Temp: 98.5 F (36.9 C) 99.5 F (37.5 C) 97.8 F (36.6 C) 98.4 F (36.9 C)  TempSrc: Oral Oral Oral Oral  SpO2:  94% 98% 97%  Weight:  78.9 kg (173 lb 14.4 oz) 78.2 kg (172 lb  6.4 oz)   Height:  '5\' 5"'  (1.651 m)      Intake/Output Summary (Last 24 hours) at 01/24/17 1522 Last data filed at 01/24/17 0900  Gross per 24 hour  Intake           2402.5 ml  Output                1 ml  Net           2401.5 ml   Filed Weights   01/23/17 2016 01/24/17 0455  Weight: 78.9 kg (173 lb 14.4 oz) 78.2 kg (172 lb 6.4 oz)    Examination:  General exam: Appears calm and comfortable Respiratory system: Clear to auscultation. Respiratory effort normal. Cardiovascular system: S1 & S2 heard, RRR. No murmurs Gastrointestinal system: Abdomen is nondistended, soft and nontender.  Normal bowel sounds heard. Central nervous system: Alert and oriented. No focal neurological deficits. Extremities: No edema. No calf tenderness Skin: erythematous rash on right foot from left great toe, dorsal surface and plantar surface with an ulcer on medial aspect of right great toe and right medial malleolus and no tenderness. Area is warm. Psychiatry: Judgement and insight appear normal. Mood & affect appropriate.         Data Reviewed: I have personally reviewed following labs and imaging studies  CBC:  Recent Labs Lab 01/18/17 0938 01/23/17 1624 01/23/17 2042 01/24/17 0446  WBC 9.5 14.7* 13.2* 12.2*  NEUTROABS 5.6 11.1* 8.6*  --   HGB 14.2 13.4 13.0 12.8*  HCT 44.2 41.1 40.1 39.9  MCV  80.5 80.3 79.9 79.8  PLT 151.0 121* 125* 570*   Basic Metabolic Panel:  Recent Labs Lab 01/23/17 1624 01/23/17 2042 01/24/17 0446  NA 136 141 140  K 3.6 4.0 3.3*  CL 100* 107 108  CO2 '24 25 23  ' GLUCOSE 428* 160* 86  BUN '13 12 15  ' CREATININE 1.30* 1.13 1.19  CALCIUM 8.8* 8.7* 8.3*   GFR: Estimated Creatinine Clearance: 67.7 mL/min (by C-G formula based on SCr of 1.19 mg/dL). Liver Function Tests:  Recent Labs Lab 01/23/17 1624  AST 24  ALT 24  ALKPHOS 118  BILITOT 0.9  PROT 7.4  ALBUMIN 3.4*   No results for input(s): LIPASE, AMYLASE in the last 168 hours. No results for  input(s): AMMONIA in the last 168 hours. Coagulation Profile: No results for input(s): INR, PROTIME in the last 168 hours. Cardiac Enzymes: No results for input(s): CKTOTAL, CKMB, CKMBINDEX, TROPONINI in the last 168 hours. BNP (last 3 results) No results for input(s): PROBNP in the last 8760 hours. HbA1C: No results for input(s): HGBA1C in the last 72 hours. CBG:  Recent Labs Lab 01/23/17 1932 01/23/17 2012 01/24/17 0832 01/24/17 1205  GLUCAP 209* 170* 99 199*   Lipid Profile: No results for input(s): CHOL, HDL, LDLCALC, TRIG, CHOLHDL, LDLDIRECT in the last 72 hours. Thyroid Function Tests: No results for input(s): TSH, T4TOTAL, FREET4, T3FREE, THYROIDAB in the last 72 hours. Anemia Panel: No results for input(s): VITAMINB12, FOLATE, FERRITIN, TIBC, IRON, RETICCTPCT in the last 72 hours. Sepsis Labs:  Recent Labs Lab 01/23/17 1636  LATICACIDVEN 1.58    Recent Results (from the past 240 hour(s))  MRSA PCR Screening     Status: None   Collection Time: 01/23/17  8:23 PM  Result Value Ref Range Status   MRSA by PCR NEGATIVE NEGATIVE Final    Comment:        The GeneXpert MRSA Assay (FDA approved for NASAL specimens only), is one component of a comprehensive MRSA colonization surveillance program. It is not intended to diagnose MRSA infection nor to guide or monitor treatment for MRSA infections.          Radiology Studies: Ap / Lateral X-ray Right Foot  Result Date: 01/23/2017 CLINICAL DATA:  Nonhealing wound on right great toe after burn injury 2-3 weeks ago EXAM: RIGHT FOOT - 2 VIEW COMPARISON:  10/16/2016 FINDINGS: No fracture or dislocation is seen. Mild degenerative changes the 1st MTP joint. Mild soft tissue swelling along the medial aspect of the 1st digit at the IP joint. Small plantar and posterior calcaneal enthesophytes. IMPRESSION: Mild soft tissue swelling along the medial aspect of the 1st digit. Electronically Signed   By: Julian Hy M.D.    On: 01/23/2017 22:02        Scheduled Meds: . aspirin  81 mg Oral Daily  . atorvastatin  80 mg Oral QHS  . cefTRIAXone (ROCEPHIN)  IV  2 g Intravenous Q24H   And  . metroNIDAZOLE  500 mg Oral Q8H  . enoxaparin (LOVENOX) injection  40 mg Subcutaneous Q24H  . hydrocerin   Topical BID  . insulin aspart  0-5 Units Subcutaneous QHS  . insulin aspart  0-9 Units Subcutaneous TID WC  . metoprolol tartrate  25 mg Oral BID  . pramipexole  0.125 mg Oral QHS  . sodium chloride flush  3 mL Intravenous Q12H  . vancomycin  1,000 mg Intravenous Q12H   Continuous Infusions:   LOS: 1 day     Cordelia Poche,  MD [facility] 01/24/2017, 3:22 PM Pager: [phone]  If 7PM-7AM, please contact night-coverage [email or web address] Password [email or web address] 01/24/2017, 3:22 PM

## 2017-01-24 NOTE — Progress Notes (Signed)
Inpatient Diabetes Program Recommendations  AACE/ADA: New Consensus Statement on Inpatient Glycemic Control (2015)  Target Ranges:  Prepandial:   less than 140 mg/dL      Peak postprandial:   less than 180 mg/dL (1-2 hours)      Critically ill patients:  140 - 180 mg/dL   Lab Results  Component Value Date   GLUCAP 199 (H) 01/24/2017   HGBA1C 8.6 12/22/2016    Review of Glycemic Control  Diabetes history: DM2 Outpatient Diabetes medications: Invokana 100 mg QD, glipizide 10 mg bid Current orders for Inpatient glycemic control: Novolog 0-9 units tidwc and hs  Need tighter glycemic control for healing. HgbA1C of 8.6% indicative of poor glycemic control.  Inpatient Diabetes Program Recommendations:    Add Novolog 3 units tidwc for meal coverage insulin. Will need f/u with PCP regarding adjustment to diabetes meds.  Will follow. Thank you. Lorenda Peck, RD, LDN, CDE Inpatient Diabetes Coordinator [phone]

## 2017-01-25 ENCOUNTER — Inpatient Hospital Stay (HOSPITAL_COMMUNITY): Payer: BLUE CROSS/BLUE SHIELD

## 2017-01-25 DIAGNOSIS — M868X7 Other osteomyelitis, ankle and foot: Secondary | ICD-10-CM

## 2017-01-25 LAB — GLUCOSE, CAPILLARY
GLUCOSE-CAPILLARY: 205 mg/dL — AB (ref 65–99)
Glucose-Capillary: 250 mg/dL — ABNORMAL HIGH (ref 65–99)
Glucose-Capillary: 256 mg/dL — ABNORMAL HIGH (ref 65–99)
Glucose-Capillary: 287 mg/dL — ABNORMAL HIGH (ref 65–99)

## 2017-01-25 LAB — HEMOGLOBIN A1C
HEMOGLOBIN A1C: 9.5 % — AB (ref 4.8–5.6)
MEAN PLASMA GLUCOSE: 226 mg/dL

## 2017-01-25 MED ORDER — GADOBENATE DIMEGLUMINE 529 MG/ML IV SOLN
20.0000 mL | Freq: Once | INTRAVENOUS | Status: AC
  Administered 2017-01-25: 17 mL via INTRAVENOUS

## 2017-01-25 MED ORDER — ADULT MULTIVITAMIN W/MINERALS CH
1.0000 | ORAL_TABLET | Freq: Every day | ORAL | Status: DC
  Administered 2017-01-25 – 2017-01-27 (×3): 1 via ORAL
  Filled 2017-01-25 (×3): qty 1

## 2017-01-25 NOTE — Progress Notes (Signed)
Ortho Note  After chart review from this admission, including clinical exam findings, labs, and radiography, I do not see any indication for acute surgical intervention during this hospitalization.  I would recommend tight glycemic control and medical management of the osteomyelitis.  Formal consult note to follow in the morning.  Nicholes Stairs

## 2017-01-25 NOTE — Progress Notes (Signed)
Inpatient Diabetes Program Recommendations  AACE/ADA: New Consensus Statement on Inpatient Glycemic Control (2015)  Target Ranges:  Prepandial:   less than 140 mg/dL      Peak postprandial:   less than 180 mg/dL (1-2 hours)      Critically ill patients:  140 - 180 mg/dL   Diabetes history: DM 2 Outpatient Diabetes medications: Invokana Glipizide  A1c Trend: Results for SENCERE, SYMONETTE (MRN [id number]) as of 01/25/2017 13:15  Ref. Range 06/10/2016 18:45 07/17/2016 11:48 08/20/2016 09:18 09/22/2016 14:19 11/17/2016 14:35 12/22/2016 14:04 01/23/2017 20:42  Hemoglobin A1C Latest Ref Range: 4.8 - 5.6 % 12.2 (H) 8.9 8.2 (H) 7.2 8.6 8.6 9.5 (H)   Inpatient Diabetes Program Recommendations:   Spoke with patient about A1c level and trends. Patient's initial glucose last year was 13% has improved but lately has had an increase in trends.  Patient switched from metformin to invokana 2-3 weeks ago. Spoke with patient about glucose control and foot care. Spoke with patient about checking his CBGs at home twice a day. Spoke with patient about possibly seeing an Musician and spoke with patient about medication options for glucose control to follow up with his PCP. Patient reports still drinking coke but not as much as he use to.  Thanks,  Tama Headings RN, MSN, [facility] Inpatient Diabetes Coordinator Team Pager [phone] (8a-5p)
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
## 2017-01-25 NOTE — Progress Notes (Signed)
Initial Nutrition Assessment  DOCUMENTATION CODES:   Not applicable  INTERVENTION:   -MVI daily  NUTRITION DIAGNOSIS:   Increased nutrient needs related to wound healing as evidenced by estimated needs.  GOAL:   Patient will meet greater than or equal to 90% of their needs  MONITOR:   PO intake, Supplement acceptance, Labs, Weight trends, Skin, I & O's  REASON FOR ASSESSMENT:   Consult Wound healing  ASSESSMENT:   [name] is a 56 y.o. male with medical history significant of DM2, HTN, HLD, sCHF (TTE on 2/18, EF of 45% with grade 2 diastolic dysfunction), CAD s/p CABG in November of 2017, paroxysmal Afib not currently on [facility]. He presented with foot swelling and was found to have cellulitis and a foot ulcer  Pt admitted with DM foot ulcer.   Reviewed CWOCN note on 01/24/17; with full thickness wound to rt medial malleolus, and partial thickness wound and callous to rt great toe.   Spoke with pt, who reports great appetite. Meal completion 75-100%. Pt shares that he lost weight approximately 6 months ago related to an extended hospitalization that involved heart surgery, however. "I've gained it all back plus ten pounds". Of note, pt reports that he has made some good efforts to improve compliance of DM diet. He has cut back on sodas and tries to avoid pasta. He has increased intake of lean proteins and vegetables. He shares that his average blood sugar is around 250 (as compared to 400) and was able to successfully drop Hgb A1c down from 12 to 7.2. Pt reports his home medication regimen has recently changed- currently on 10 mg gluctorol BID and 100 mg invokana daily. Pt is optimistic that medication changes as well as lifestyle changes will help optimize glycemic control. DM coordinator has seen and recommended pt see an endocrinologist for medication management. Of note, pt attended DM self-management education at [facility] Nutrition and Diabetes Education services on 06/26/16  and pt was able to teach back principles of DM diet and self-care to this RD.   Nutrition-Focused physical exam completed. Findings are no fat depletion, mild muscle depletion, and no edema.   Pt reports he will likely d/c home today or tomorrow.   Labs reviewed: CBGS: 199-250.   Diet Order:  Diet heart healthy/carb modified Room service appropriate? Yes; Fluid consistency: Thin  Skin:  Wound (see comment) (callous/partial thickness wound rt great toe, full thickness rt malleous)  Last BM:  01/24/17  Height:   Ht Readings from Last 1 Encounters:  01/23/17 5\' 5"  (1.651 m)    Weight:   Wt Readings from Last 1 Encounters:  01/24/17 172 lb 6.4 oz (78.2 kg)    Ideal Body Weight:  61.8 kg  BMI:  Body mass index is 28.69 kg/m.  Estimated Nutritional Needs:   Kcal:  1800-2000  Protein:  90-105 grams  Fluid:  1.8-2.0 L  EDUCATION NEEDS:   Education needs addressed  Zophia Marrone A. Jimmye Norman, RD, LDN, CDE Pager: [phone] After hours Pager: [phone]

## 2017-01-26 DIAGNOSIS — E119 Type 2 diabetes mellitus without complications: Secondary | ICD-10-CM

## 2017-01-26 DIAGNOSIS — E785 Hyperlipidemia, unspecified: Secondary | ICD-10-CM

## 2017-01-26 DIAGNOSIS — Z833 Family history of diabetes mellitus: Secondary | ICD-10-CM

## 2017-01-26 DIAGNOSIS — I110 Hypertensive heart disease with heart failure: Secondary | ICD-10-CM

## 2017-01-26 DIAGNOSIS — I509 Heart failure, unspecified: Secondary | ICD-10-CM

## 2017-01-26 DIAGNOSIS — L03115 Cellulitis of right lower limb: Secondary | ICD-10-CM

## 2017-01-26 DIAGNOSIS — F17210 Nicotine dependence, cigarettes, uncomplicated: Secondary | ICD-10-CM

## 2017-01-26 DIAGNOSIS — E1169 Type 2 diabetes mellitus with other specified complication: Secondary | ICD-10-CM

## 2017-01-26 DIAGNOSIS — Z8249 Family history of ischemic heart disease and other diseases of the circulatory system: Secondary | ICD-10-CM

## 2017-01-26 DIAGNOSIS — M869 Osteomyelitis, unspecified: Secondary | ICD-10-CM

## 2017-01-26 DIAGNOSIS — Z91040 Latex allergy status: Secondary | ICD-10-CM

## 2017-01-26 DIAGNOSIS — M86171 Other acute osteomyelitis, right ankle and foot: Secondary | ICD-10-CM

## 2017-01-26 DIAGNOSIS — I2510 Atherosclerotic heart disease of native coronary artery without angina pectoris: Secondary | ICD-10-CM

## 2017-01-26 DIAGNOSIS — B9689 Other specified bacterial agents as the cause of diseases classified elsewhere: Secondary | ICD-10-CM

## 2017-01-26 LAB — GLUCOSE, CAPILLARY
GLUCOSE-CAPILLARY: 167 mg/dL — AB (ref 65–99)
GLUCOSE-CAPILLARY: 254 mg/dL — AB (ref 65–99)
GLUCOSE-CAPILLARY: 211 mg/dL — AB (ref 65–99)
GLUCOSE-CAPILLARY: 247 mg/dL — AB (ref 65–99)

## 2017-01-26 LAB — BASIC METABOLIC PANEL
Anion gap: 11 (ref 5–15)
BUN: 17 mg/dL (ref 6–20)
CHLORIDE: 102 mmol/L (ref 101–111)
CO2: 26 mmol/L (ref 22–32)
CREATININE: 1.19 mg/dL (ref 0.61–1.24)
Calcium: 9.6 mg/dL (ref 8.9–10.3)
GFR calc Af Amer: >60 mL/min (ref >60)
GLUCOSE: 281 mg/dL — AB (ref 65–99)
POTASSIUM: 4.4 mmol/L (ref 3.5–5.1)
Sodium: 139 mmol/L (ref 135–145)

## 2017-01-26 LAB — PREALBUMIN: Prealbumin: 15.7 mg/dL — ABNORMAL LOW (ref 18–38)

## 2017-01-26 LAB — SEDIMENTATION RATE: Sed Rate: 40 mm/hr — ABNORMAL HIGH (ref 0–16)

## 2017-01-26 LAB — C-REACTIVE PROTEIN: CRP: 7.2 mg/dL — ABNORMAL HIGH (ref <1.0)

## 2017-01-26 LAB — VANCOMYCIN, TROUGH: Vancomycin Tr: 19 ug/mL (ref 15–20)

## 2017-01-26 MED ORDER — SODIUM CHLORIDE 0.9% FLUSH
10.0000 mL | INTRAVENOUS | Status: DC | PRN
Start: 2017-01-26 — End: 2017-01-28

## 2017-01-26 MED ORDER — CANAGLIFLOZIN 100 MG PO TABS
100.0000 mg | ORAL_TABLET | Freq: Every day | ORAL | Status: DC
Start: 2017-01-26 — End: 2017-01-28
  Administered 2017-01-26 – 2017-01-27 (×2): 100 mg via ORAL
  Filled 2017-01-26 (×3): qty 1

## 2017-01-26 MED ORDER — POLYETHYLENE GLYCOL 3350 17 G PO PACK
17.0000 g | PACK | Freq: Every day | ORAL | Status: DC
  Administered 2017-01-26 – 2017-01-27 (×2): 17 g via ORAL
  Filled 2017-01-26: qty 1

## 2017-01-26 NOTE — Consult Note (Signed)
WOC consult requested for foot wound.  This was already performed on 4/8; refer to previous consult note for assessment and topical treatment orders.  Ortho has also assessed on 4/10, refer to their progress notes. Please re-consult if further assistance is needed.  Thank-you,  Julien Girt MSN, Tiare Rohlman, [place], [place], [place]

## 2017-01-26 NOTE — Progress Notes (Signed)
PHARMACY CONSULT NOTE FOR:  OUTPATIENT  PARENTERAL ANTIBIOTIC THERAPY (OPAT)  Indication: Osteomyelitis  Regimen: Vancomycin 1g IV q12hr - aim for goal trough 15-20 mcg/ml, CTX 2g IV q24hr, and PO Flagyl 500 mg q8hr. Total 6 weeks of antibiotic therapy.  End date: 03/06/17  IV antibiotic discharge orders are pended. To discharging provider:  please sign these orders via discharge navigator,  Select New Orders & click on the button choice - Manage This Unsigned Work.    Argie Ramming, PharmD Pharmacy Resident  Pager [phone] 01/26/17 3:42 PM

## 2017-01-26 NOTE — Progress Notes (Signed)
Peripherally Inserted Central Catheter/Midline Placement  The IV Nurse has discussed with the patient and/or persons authorized to consent for the patient, the purpose of this procedure and the potential benefits and risks involved with this procedure.  The benefits include less needle sticks, lab draws from the catheter, and the patient may be discharged home with the catheter. Risks include, but not limited to, infection, bleeding, blood clot (thrombus formation), and puncture of an artery; nerve damage and irregular heartbeat and possibility to perform a PICC exchange if needed/ordered by physician.  Alternatives to this procedure were also discussed.  [name] Power PICC patient education guide, fact sheet on infection prevention and patient information card has been provided to patient /or left at bedside.    PICC/Midline Placement Documentation        [name] 01/26/2017, 6:27 PM
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
## 2017-01-26 NOTE — Progress Notes (Signed)
Brief Nutrition Follow-Up Note  RD received another consult for wound healing. RD evaluated pt on 01/25/17; see initial nutrition assessment for further details.   Education has already been provided and interventions in place. RD will continue with current plan of care.   RD will continue to follow for acute nutritional issues.   Maleik Vanderzee A. Jimmye Norman, RD, LDN, CDE Pager: [phone] After hours Pager: [phone]

## 2017-01-26 NOTE — Progress Notes (Addendum)
PROGRESS NOTE    [name]  XYV:[id number] DOB: Jun 10, 1961 DOA: 01/23/2017 PCP: [name], NP   Brief Narrative: [name] is a 56 y.o. male with medical history significant of DM2, HTN, HLD, sCHF (TTE on 2/18, EF of 45% with grade 2 diastolic dysfunction), CAD s/p CABG in November of 2017, paroxysmal Afib not currently on [facility]. He presented with foot swelling and was found to have cellulitis and a foot ulcer   Assessment & Plan:   Principal Problem:   Diabetic foot ulcer with osteomyelitis ([place]) Active Problems:   Type 2 diabetes mellitus, uncontrolled ([place])   Essential hypertension   Pure hypercholesterolemia   PAF (paroxysmal atrial fibrillation) ([facility])   S/P CABG x 3   Diabetic foot ulcer ([facility])   Diabetic foot ulcer Osteomyelitis Met criteria for severe infection. Appears stable. Afebrile. X-ray negative for osteomyelitis. MRI today positive for osteomyelitis. ABI from 05/2017 are normal. -continue Ceftriaxone, metronidazole and vancomycin -blood culture pending (NG x3 days) -orthopedic surgery consult: no plans for surgery -infectious disease consult  Type 2 diabetes mellitus Uncontrolled. A1C of 9.5. Fasting blood sugar 167. -Sliding scale insulin -restart home Invokana. If adequate control, no changes, however, may need to start talking about insulin as an outpatient.  Essential hypertension -continue metoprolol  Hypercholesterolemia -continue atorvastatin  Paroxysmal atrial fibrillation Anticoagulation discontinued by cardiology as an outpatient. -continue metoprolol  CAD s/p CABG x3 No chest pain -continue statin and aspirin  CKD Stable.  Constipation -Miralax   DVT prophylaxis: Lovenox Code Status: Full code Family Communication: None at bedside Disposition Plan: Discharge in 1-2 days pending ID recommendations for antibiotic therapy   Consultants:   None  Procedures:    None  Antimicrobials:  Vancomycin  Ceftriaxone  Metronidazole    Subjective: Improvement in swelling and redness.  Objective: Vitals:   01/25/17 0452 01/25/17 1425 01/25/17 2156 01/26/17 0450  BP: 126/79 123/80 117/81 133/84  Pulse: 94 99 (!) 105 (!) 101  Resp: '18 18 18 18  ' Temp: 98.3 F (36.8 C) 98.5 F (36.9 C) 98.5 F (36.9 C) 98.3 F (36.8 C)  TempSrc: Oral Oral Oral Oral  SpO2: 100% 99% 100% 99%  Weight:      Height:        Intake/Output Summary (Last 24 hours) at 01/26/17 1004 Last data filed at 01/26/17 2863  Gross per 24 hour  Intake              930 ml  Output                0 ml  Net              930 ml   Filed Weights   01/23/17 2016 01/24/17 0455  Weight: 78.9 kg (173 lb 14.4 oz) 78.2 kg (172 lb 6.4 oz)    Examination:  General exam: Appears calm and comfortable Respiratory system: Clear to auscultation. Respiratory effort normal. Cardiovascular system: S1 & S2 heard, RRR. No murmurs. 2+ DP pulses Gastrointestinal system: Abdomen is nondistended, soft and nontender.  Decreased bowel sounds heard. Central nervous system: Alert and oriented. No focal neurological deficits. Extremities: No edema. No calf tenderness Skin: erythematous rash on right foot from left great toe, dorsal surface and plantar surface with an ulcer on medial aspect of right great toe and right medial malleolus and no tenderness. Area is warm. Erythema continues to improve Psychiatry: Judgement and insight appear normal. Mood & affect appropriate.   Pictures taken on 01/24/2017  Data Reviewed: I have personally reviewed following labs and imaging studies  CBC:  Recent Labs Lab 01/23/17 1624 01/23/17 2042 01/24/17 0446  WBC 14.7* 13.2* 12.2*  NEUTROABS 11.1* 8.6*  --   HGB 13.4 13.0 12.8*  HCT 41.1 40.1 39.9  MCV 80.3 79.9 79.8  PLT 121* 125* 030*   Basic Metabolic Panel:  Recent Labs Lab 01/23/17 1624 01/23/17 2042 01/24/17 0446  NA 136 141 140   K 3.6 4.0 3.3*  CL 100* 107 108  CO2 '24 25 23  ' GLUCOSE 428* 160* 86  BUN '13 12 15  ' CREATININE 1.30* 1.13 1.19  CALCIUM 8.8* 8.7* 8.3*   GFR: Estimated Creatinine Clearance: 67.7 mL/min (by C-G formula based on SCr of 1.19 mg/dL). Liver Function Tests:  Recent Labs Lab 01/23/17 1624  AST 24  ALT 24  ALKPHOS 118  BILITOT 0.9  PROT 7.4  ALBUMIN 3.4*   HbA1C:  Recent Labs  01/23/17 2042  HGBA1C 9.5*   CBG:  Recent Labs Lab 01/25/17 0758 01/25/17 1217 01/25/17 1717 01/25/17 2153 01/26/17 0804  GLUCAP 250* 256* 205* 287* 167*   Lipid Profile: No results for input(s): CHOL, HDL, LDLCALC, TRIG, CHOLHDL, LDLDIRECT in the last 72 hours. Thyroid Function Tests: No results for input(s): TSH, T4TOTAL, FREET4, T3FREE, THYROIDAB in the last 72 hours. Anemia Panel: No results for input(s): VITAMINB12, FOLATE, FERRITIN, TIBC, IRON, RETICCTPCT in the last 72 hours. Sepsis Labs:  Recent Labs Lab 01/23/17 1636  LATICACIDVEN 1.58    Recent Results (from the past 240 hour(s))  Blood culture (routine x 2)     Status: None (Preliminary result)   Collection Time: 01/23/17  4:29 PM  Result Value Ref Range Status   Specimen Description BLOOD LEFT ANTECUBITAL  Final   Special Requests   Final    BOTTLES DRAWN AEROBIC AND ANAEROBIC Blood Culture adequate volume   Culture NO GROWTH 3 DAYS  Final   Report Status PENDING  Incomplete  Blood culture (routine x 2)     Status: None (Preliminary result)   Collection Time: 01/23/17  5:15 PM  Result Value Ref Range Status   Specimen Description BLOOD RIGHT ANTECUBITAL  Final   Special Requests   Final    BOTTLES DRAWN AEROBIC AND ANAEROBIC Blood Culture adequate volume   Culture NO GROWTH 3 DAYS  Final   Report Status PENDING  Incomplete  MRSA PCR Screening     Status: None   Collection Time: 01/23/17  8:23 PM  Result Value Ref Range Status   MRSA by PCR NEGATIVE NEGATIVE Final    Comment:        The GeneXpert MRSA Assay  (FDA approved for NASAL specimens only), is one component of a comprehensive MRSA colonization surveillance program. It is not intended to diagnose MRSA infection nor to guide or monitor treatment for MRSA infections.          Radiology Studies: Mr Foot Right W Wo Contrast  Result Date: 01/25/2017 CLINICAL DATA:  Diabetic patient with right foot and ankle pain and swelling. Ulcerations on the right great toe and the medial aspect of the right ankle. EXAM: MRI OF THE RIGHT FOREFOOT WITHOUT AND WITH CONTRAST TECHNIQUE: Multiplanar, multisequence MR imaging of the right foot was performed before and after the administration of intravenous contrast. CONTRAST:  17 ml MULTIHANCE GADOBENATE DIMEGLUMINE 529 MG/ML IV SOLN COMPARISON:  Plain films right foot 01/24/2015. FINDINGS: Bones/Joint/Cartilage There is marrow edema and enhancement throughout the distal phalanx of the great  toe. Small focus of very mild edema and enhancement are also seen in the central aspect of the head of the proximal phalanx of the great toe. Trace IP joint effusion of the great toe is seen. Marrow edema and enhancement are also noted in the lateral sesamoid of the first MTP joint. Bone marrow signal is otherwise normal. Ligaments Intact. Muscles and Tendons Mild edema and enhancement are seen in intrinsic musculature the foot consistent with inflammatory change. No intramuscular abscess is identified. Soft tissues No soft tissue abscess is identified. Subcutaneous edema and enhancement are present about the foot. The patient's skin ulceration appears to be on the plantar surface of the great toe but is difficult to localize. IMPRESSION: Edema enhancement throughout the distal phalanx of the great toe consistent with osteomyelitis. Cellulitis is seen about the foot. No soft tissue abscess is identified. Small focus of very mild edema and enhancement in the central aspect of the head of the proximal phalanx of the great toe is  likely reactive rather than due to osteomyelitis. Edema and enhancement throughout the lateral sesamoid bone of the first MTP joint could be due to infection although sesamoid dysfunction is more likely given distance from the patient's skin ulceration. Electronically Signed   By: Inge Rise M.D.   On: 01/25/2017 16:29   Mr Ankle Right W Wo Contrast  Result Date: 01/25/2017 CLINICAL DATA:  Diabetic patient right ankle pain and swelling. Skin ulceration on the medial aspect of the right ankle. EXAM: MRI OF THE RIGHT ANKLE WITHOUT AND WITH CONTRAST TECHNIQUE: Multiplanar, multisequence MR imaging of the ankle was performed before and after the administration of intravenous contrast. CONTRAST:  17 ml MULTIHANCE GADOBENATE DIMEGLUMINE 529 MG/ML IV SOLN COMPARISON:  None. FINDINGS: TENDONS Peroneal: Intact. Posteromedial: Intact. Anterior: Intact. Achilles: Intact. Plantar Fascia: Appears normal.  Plantar calcaneal spur noted. LIGAMENTS Lateral: Intact. Medial: Intact. CARTILAGE Ankle Joint: Appears normal.  No effusion. Subtalar Joints/Sinus Tarsi: Appear normal. Bones: No fracture or worrisome lesion. No marrow edema or enhancement to suggest osteomyelitis. Other: Skin ulceration is seen over the medial malleolus. No underlying fluid collection. Mild subcutaneous edema and enhancement are present about the ankle. IMPRESSION: IMPRESSION Skin ulceration over the medial malleolus. Negative for abscess or osteomyelitis. Negative for tendon or ligament tear. Electronically Signed   By: Inge Rise M.D.   On: 01/25/2017 16:32        Scheduled Meds: . aspirin  81 mg Oral Daily  . atorvastatin  80 mg Oral QHS  . canagliflozin  100 mg Oral QAC breakfast  . cefTRIAXone (ROCEPHIN)  IV  2 g Intravenous Q24H   And  . metroNIDAZOLE  500 mg Oral Q8H  . enoxaparin (LOVENOX) injection  40 mg Subcutaneous Q24H  . hydrocerin   Topical BID  . insulin aspart  0-5 Units Subcutaneous QHS  . insulin aspart  0-9  Units Subcutaneous TID WC  . metoprolol tartrate  25 mg Oral BID  . multivitamin with minerals  1 tablet Oral Daily  . polyethylene glycol  17 g Oral Daily  . pramipexole  0.125 mg Oral QHS  . sodium chloride flush  3 mL Intravenous Q12H  . vancomycin  1,000 mg Intravenous Q12H   Continuous Infusions:   LOS: 3 days     Cordelia Poche, MD [facility] 01/26/2017, 10:04 AM Pager: [phone]  If 7PM-7AM, please contact night-coverage [email or web address] Password [email or web address] 01/26/2017, 10:04 AM
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
## 2017-01-26 NOTE — Progress Notes (Signed)
Pharmacy Antibiotic Note [name] is a 56 y.o. male admitted on 01/23/2017 with cellulitis.  Pharmacy following for vancomycin dosing. Planning for 6 weeks total of therapy.  Vancomycin trough of 19 this evening is within desired range of 15-20. It was drawn ~ 1.5 hours early but suspect still within range of 15-20. SCr 1.19 and remains stable.   Plan: Continue Vancomycin 1000mg  IV q12h Weekly vancomycin trough unless clinical course dictates otherwise  SCr every 72 hours while on vancomycin  CTX 2g IV q24h Flagyl 500mg  IV q8h  Height: 5\' 5"  (165.1 cm) Weight: 172 lb 6.4 oz (78.2 kg) IBW/kg (Calculated) : 61.5  Temp (24hrs), Avg:98.4 F (36.9 C), Min:98.3 F (36.8 C), Max:98.5 F (36.9 C)   Recent Labs Lab 01/23/17 1624 01/23/17 1636 01/23/17 2042 01/24/17 0446 01/26/17 1331 01/26/17 1557  WBC 14.7*  --  13.2* 12.2*  --   --   CREATININE 1.30*  --  1.13 1.19 1.19  --   LATICACIDVEN  --  1.58  --   --   --   --   VANCOTROUGH  --   --   --   --   --  19    Estimated Creatinine Clearance: 67.7 mL/min (by C-G formula based on SCr of 1.19 mg/dL).    Allergies  Allergen Reactions  . Latex Rash    Antimicrobials this admission: Vancomycin 4/7 >> 5/19 Rocephin 4/7 >> 5/19 Flagyl 4/7 >> 5/19  Dose adjustments this admission: n/a  Microbiology results: 4/7 BCx:  ngtd 4/7 MRSA PCR: neg   Thank you for allowing [place] to participate in this patients care.  Vincenza Hews, PharmD, BCPS 01/26/2017, 5:09 PM   '

## 2017-01-26 NOTE — Progress Notes (Signed)
Inpatient Diabetes Program Recommendations  AACE/ADA: New Consensus Statement on Inpatient Glycemic Control (2015)  Target Ranges:  Prepandial:   less than 140 mg/dL      Peak postprandial:   less than 180 mg/dL (1-2 hours)      Critically ill patients:  140 - 180 mg/dL   Lab Results  Component Value Date   GLUCAP 167 (H) 01/26/2017   HGBA1C 9.5 (H) 01/23/2017    Review of Glycemic Control  Post-prandial blood sugars elevated. May benefit from addition of meal coverage insulin.  Inpatient Diabetes Program Recommendations:    Add Novolog 3 units tidwc for meal coverage insulin. If FBS > 180 mg/dL, will need some basal insulin - Levemir 10 units QHS  Will continue to follow.  Thank you. Lorenda Peck, RD, LDN, CDE Inpatient Diabetes Coordinator [phone]

## 2017-01-27 ENCOUNTER — Encounter (HOSPITAL_COMMUNITY): Payer: BLUE CROSS/BLUE SHIELD

## 2017-01-27 DIAGNOSIS — L97514 Non-pressure chronic ulcer of other part of right foot with necrosis of bone: Secondary | ICD-10-CM

## 2017-01-27 DIAGNOSIS — L97509 Non-pressure chronic ulcer of other part of unspecified foot with unspecified severity: Secondary | ICD-10-CM

## 2017-01-27 DIAGNOSIS — E13621 Other specified diabetes mellitus with foot ulcer: Secondary | ICD-10-CM

## 2017-01-27 LAB — GLUCOSE, CAPILLARY
Glucose-Capillary: 138 mg/dL — ABNORMAL HIGH (ref 65–99)
Glucose-Capillary: 183 mg/dL — ABNORMAL HIGH (ref 65–99)
Glucose-Capillary: 222 mg/dL — ABNORMAL HIGH (ref 65–99)

## 2017-01-27 MED ORDER — METRONIDAZOLE 500 MG PO TABS: 500.0000 mg | ORAL_TABLET | Freq: Three times a day (TID) | ORAL | 0 refills | Status: DC

## 2017-01-27 MED ORDER — VANCOMYCIN IV (FOR PTA / DISCHARGE USE ONLY): 1000.0000 mg | Freq: Two times a day (BID) | INTRAVENOUS | 0 refills | Status: AC

## 2017-01-27 MED ORDER — HYDROCERIN EX CREA: 1.0000 "application " | TOPICAL_CREAM | Freq: Two times a day (BID) | CUTANEOUS | 0 refills | Status: DC

## 2017-01-27 MED ORDER — HEPARIN SOD (PORK) LOCK FLUSH 100 UNIT/ML IV SOLN
250.0000 [IU] | INTRAVENOUS | Status: AC | PRN
  Administered 2017-01-27: 250 [IU]

## 2017-01-27 MED ORDER — CEFTRIAXONE IV (FOR PTA / DISCHARGE USE ONLY): 2.0000 g | INTRAVENOUS | 0 refills | Status: AC

## 2017-01-27 NOTE — Progress Notes (Signed)
[place]  [name] is a new pt to [facility]  this hospital admission.  [facility] will be providing HHRN and Home Infusion Pharmacy services for home IV ABX (Vancomycin and Rocephin) as per ID recommendations. [facility] Infusion Coordinator will provide in hospital pre discharge teaching regarding IV ABX administration with pt and daughter, [name], to support independence at home. [facility] is prepared for DC today and admission with visit tonight.  If patient discharges after hours, please call [phone].   [name] 01/27/2017, 10:52 AM

## 2017-01-27 NOTE — Care Management Note (Signed)
Case Management Note  Patient Details  Name: [name] MRN: [id number] Date of Birth: [birth year]  Subjective/Objective:          Admitted with Diabetic foot ulcer with osteomyelitis, history of poorly controlled diabetes mellitus hypertension hyperlipidemia heart failure, coronary artery disease status post coronary bypass grafting by paroxysmal atrial fibrillation. From home with family.          PCP: [name]  Action/Plan: Plan is to d/c to home today with home health services. Pt will need long term IV antibiotic infusion.  Expected Discharge Date:    01/27/2017           Expected Discharge Plan:  [place]  In-House Referral:     Discharge planning Services  CM Consult  Post Acute Care Choice:    Choice offered to:  Patient  DME Arranged:  IV pump/equipment/ medication DME Agency:  [employer] referral made with Pam @ [phone]  [facility] Arranged:  RN [facility] Agency:  [employer] referral made with Butch Penny @ [phone].  Status of Service:  Completed, signed off  If discussed at [place] of Stay Meetings, dates discussed:    Additional Comments:  Sharin Mons, RN 01/27/2017, 10:40 AM

## 2017-01-27 NOTE — Progress Notes (Signed)
Inpatient Diabetes Program Recommendations  AACE/ADA: New Consensus Statement on Inpatient Glycemic Control (2015)  Target Ranges:  Prepandial:   less than 140 mg/dL      Peak postprandial:   less than 180 mg/dL (1-2 hours)      Critically ill patients:  140 - 180 mg/dL   Lab Results  Component Value Date   GLUCAP 183 (H) 01/27/2017   HGBA1C 9.5 (H) 01/23/2017    Review of Glycemic Control  HgbA1C - 9.5%. Needs adjustment to home meds prior to discharge.   Inpatient Diabetes Program Recommendations:    MD - if pt is to go home on insulin - will order insulin starter kit and RN to begin teaching insulin administration. Meal coverage insulin - Novolog 3 units tidwc  Needs to f/u with PCP within 1 week of discharge.  Will continue to follow. Thank you. Lorenda Peck, RD, LDN, CDE Inpatient Diabetes Coordinator [phone]
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
## 2017-01-28 LAB — CULTURE, BLOOD (ROUTINE X 2)
CULTURE: NO GROWTH
Culture: NO GROWTH
SPECIAL REQUESTS: ADEQUATE
SPECIAL REQUESTS: ADEQUATE

## 2017-01-28 NOTE — Telephone Encounter (Signed)
error 

## 2017-02-01 LAB — BASIC METABOLIC PANEL
BUN: 14 mg/dL (ref 4–21)
Creatinine: 1.1 mg/dL (ref 0.6–1.3)
POTASSIUM: 4.2 mmol/L (ref 3.4–5.3)
SODIUM: 140 mmol/L (ref 137–147)

## 2017-02-01 LAB — CBC AND DIFFERENTIAL
HEMATOCRIT: 38 % — AB (ref 41–53)
HEMOGLOBIN: 12.1 g/dL — AB (ref 13.5–17.5)
Neutrophils Absolute: 6 /uL
Platelets: 180 10*3/uL (ref 150–399)
WBC: 9.1 10^3/mL

## 2017-02-02 ENCOUNTER — Encounter: Payer: Self-pay | Admitting: Adult Health

## 2017-02-02 ENCOUNTER — Telehealth: Payer: Self-pay | Admitting: Adult Health

## 2017-02-02 ENCOUNTER — Ambulatory Visit (INDEPENDENT_AMBULATORY_CARE_PROVIDER_SITE_OTHER): Payer: BLUE CROSS/BLUE SHIELD | Admitting: Adult Health

## 2017-02-02 VITALS — BP 122/74 | HR 85 | Temp 98.7°F | Ht 65.0 in | Wt 174.8 lb

## 2017-02-02 DIAGNOSIS — L97509 Non-pressure chronic ulcer of other part of unspecified foot with unspecified severity: Secondary | ICD-10-CM | POA: Diagnosis not present

## 2017-02-02 DIAGNOSIS — E108 Type 1 diabetes mellitus with unspecified complications: Secondary | ICD-10-CM

## 2017-02-02 DIAGNOSIS — E1165 Type 2 diabetes mellitus with hyperglycemia: Secondary | ICD-10-CM | POA: Diagnosis not present

## 2017-02-02 DIAGNOSIS — M869 Osteomyelitis, unspecified: Secondary | ICD-10-CM | POA: Diagnosis not present

## 2017-02-02 DIAGNOSIS — E11621 Type 2 diabetes mellitus with foot ulcer: Secondary | ICD-10-CM | POA: Diagnosis not present

## 2017-02-02 DIAGNOSIS — IMO0002 Reserved for concepts with insufficient information to code with codable children: Secondary | ICD-10-CM

## 2017-02-02 DIAGNOSIS — Z794 Long term (current) use of insulin: Secondary | ICD-10-CM | POA: Diagnosis not present

## 2017-02-02 LAB — CBC WITH DIFFERENTIAL/PLATELET
Basophils Absolute: 0.1 10*3/uL (ref 0.0–0.1)
Basophils Relative: 0.8 % (ref 0.0–3.0)
EOS PCT: 3.1 % (ref 0.0–5.0)
Eosinophils Absolute: 0.3 10*3/uL (ref 0.0–0.7)
HCT: 38.7 % — ABNORMAL LOW (ref 39.0–52.0)
HEMOGLOBIN: 12.5 g/dL — AB (ref 13.0–17.0)
LYMPHS ABS: 2.1 10*3/uL (ref 0.7–4.0)
Lymphocytes Relative: 25.6 % (ref 12.0–46.0)
MCHC: 32.2 g/dL (ref 30.0–36.0)
MCV: 80.8 fl (ref 78.0–100.0)
MONOS PCT: 7.9 % (ref 3.0–12.0)
Monocytes Absolute: 0.7 10*3/uL (ref 0.1–1.0)
NEUTROS PCT: 62.6 % (ref 43.0–77.0)
Neutro Abs: 5.2 10*3/uL (ref 1.4–7.7)
Platelets: 183 10*3/uL (ref 150.0–400.0)
RBC: 4.8 Mil/uL (ref 4.22–5.81)
RDW: 16.1 % — ABNORMAL HIGH (ref 11.5–15.5)
WBC: 8.3 10*3/uL (ref 4.0–10.5)

## 2017-02-02 LAB — BASIC METABOLIC PANEL
BUN: 15 mg/dL (ref 6–23)
CO2: 27 mEq/L (ref 19–32)
Calcium: 8.4 mg/dL (ref 8.4–10.5)
Chloride: 106 mEq/L (ref 96–112)
Creatinine, Ser: 1.12 mg/dL (ref 0.40–1.50)
GFR: 72.17 mL/min (ref >60.00)
GLUCOSE: 192 mg/dL — AB (ref 70–99)
POTASSIUM: 3.8 meq/L (ref 3.5–5.1)
SODIUM: 140 meq/L (ref 135–145)

## 2017-02-02 LAB — GLUCOSE, POCT (MANUAL RESULT ENTRY): POC Glucose: 198 mg/dl — AB (ref 70–99)

## 2017-02-02 MED ORDER — ONETOUCH ULTRASOFT LANCETS MISC: 12 refills | Status: DC

## 2017-02-02 MED ORDER — GLUCOSE BLOOD VI STRP: ORAL_STRIP | 3 refills | Status: DC

## 2017-02-02 MED ORDER — BASAGLAR KWIKPEN 100 UNIT/ML ~~LOC~~ SOPN
5.0000 [IU] | PEN_INJECTOR | Freq: Every day | SUBCUTANEOUS | 3 refills | Status: DC
Start: 2017-02-02 — End: 2017-04-27

## 2017-02-02 NOTE — Telephone Encounter (Signed)
There had been a medication error when the pt was sent home the instruction was for 1g of vancomycin IV bid a day pharmacy chandged to 1750 once a day and has been taking 1750 bid there has been a break down in communication and they are working on getting this resolved they are waiting on the results to come back from [place].  If Dr. Elease Hashimoto need more clarification please feel free to call [facility] w/[facility].

## 2017-02-02 NOTE — Telephone Encounter (Signed)
FYI

## 2017-02-02 NOTE — Progress Notes (Signed)
Pre visit review using our clinic review tool, if applicable. No additional management support is needed unless otherwise documented below in the visit note. 

## 2017-02-02 NOTE — Patient Instructions (Addendum)
It was great seeing you today   I am going to start you on Basaglar Insulin. This is a long acting insulin. I want you to start with 5 units every night. You can go up by 2 units every three days until your blood sugars are better controlled.   Please follow up with me in one month   Someone will call you from podiatry to set up an appointment
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
## 2017-02-03 ENCOUNTER — Encounter: Payer: Self-pay | Admitting: Family Medicine

## 2017-02-04 ENCOUNTER — Telehealth: Payer: Self-pay | Admitting: Neurology

## 2017-02-04 LAB — BASIC METABOLIC PANEL
BUN: 17 mg/dL (ref 4–21)
Creatinine: 1.1 mg/dL (ref 0.6–1.3)
GLUCOSE: 229 mg/dL
POTASSIUM: 4.3 mmol/L (ref 3.4–5.3)
SODIUM: 141 mmol/L (ref 137–147)

## 2017-02-04 LAB — CBC AND DIFFERENTIAL
HEMATOCRIT: 39 % — AB (ref 41–53)
Hemoglobin: 12.3 g/dL — AB (ref 13.5–17.5)
Neutrophils Absolute: 5 /uL
Platelets: 230 10*3/uL (ref 150–399)
WBC: 8.2 10*3/mL

## 2017-02-04 NOTE — Telephone Encounter (Signed)
Called patient to see how he is doing on Mirapex.  Left message on machine for patient to call back.

## 2017-02-05 ENCOUNTER — Other Ambulatory Visit: Payer: Self-pay

## 2017-02-08 ENCOUNTER — Telehealth: Payer: Self-pay | Admitting: Adult Health

## 2017-02-08 LAB — BASIC METABOLIC PANEL
BUN: 18 mg/dL (ref 4–21)
Creatinine: 0.9 mg/dL (ref 0.6–1.3)
GLUCOSE: 226 mg/dL
POTASSIUM: 4.9 mmol/L (ref 3.4–5.3)
Sodium: 139 mmol/L (ref 137–147)

## 2017-02-08 LAB — CBC AND DIFFERENTIAL
HCT: 38 % — AB (ref 41–53)
Hemoglobin: 12.8 g/dL — AB (ref 13.5–17.5)
Neutrophils Absolute: 8 /uL
Platelets: 239 10*3/uL (ref 150–399)
WBC: 10.5 10*3/mL

## 2017-02-08 NOTE — Telephone Encounter (Signed)
° ° °  Pt request refill of the following:  Pt call to say he need needles for his insulin . Pt said Tommi Rumps put him on Levemir    Phamacy: [facility] [address] [facility] [place]

## 2017-02-09 ENCOUNTER — Encounter: Payer: Self-pay | Admitting: Family Medicine

## 2017-02-09 ENCOUNTER — Other Ambulatory Visit: Payer: Self-pay

## 2017-02-09 MED ORDER — INSULIN PEN NEEDLE 32G X 4 MM MISC: 3 refills | Status: DC

## 2017-02-09 NOTE — Telephone Encounter (Signed)
Pen needles have been sent in as directed. Thanks!

## 2017-02-15 ENCOUNTER — Telehealth: Payer: Self-pay | Admitting: Family Medicine

## 2017-02-15 NOTE — Telephone Encounter (Signed)
Received a cancer claim form/critical illness/specified disease claim form.  Called the pt to notify him that he needs to take paper work to his specialist.  Pt stated he no longer needs form.  Notified pt that I will place it in the shred.  No further action needed.  Will close note.

## 2017-02-16 ENCOUNTER — Encounter: Payer: Self-pay | Admitting: Family Medicine

## 2017-02-16 ENCOUNTER — Ambulatory Visit (INDEPENDENT_AMBULATORY_CARE_PROVIDER_SITE_OTHER): Payer: BLUE CROSS/BLUE SHIELD

## 2017-02-16 ENCOUNTER — Ambulatory Visit (INDEPENDENT_AMBULATORY_CARE_PROVIDER_SITE_OTHER): Payer: BLUE CROSS/BLUE SHIELD | Admitting: Sports Medicine

## 2017-02-16 DIAGNOSIS — M86171 Other acute osteomyelitis, right ankle and foot: Secondary | ICD-10-CM

## 2017-02-16 DIAGNOSIS — L03119 Cellulitis of unspecified part of limb: Secondary | ICD-10-CM | POA: Diagnosis not present

## 2017-02-16 DIAGNOSIS — L02611 Cutaneous abscess of right foot: Secondary | ICD-10-CM | POA: Diagnosis not present

## 2017-02-16 DIAGNOSIS — M79674 Pain in right toe(s): Secondary | ICD-10-CM

## 2017-02-16 DIAGNOSIS — L02619 Cutaneous abscess of unspecified foot: Secondary | ICD-10-CM | POA: Diagnosis not present

## 2017-02-16 LAB — BASIC METABOLIC PANEL
BUN: 15 mg/dL (ref 4–21)
CREATININE: 0.8 mg/dL (ref 0.6–1.3)
Glucose: 204 mg/dL
Potassium: 4.6 mmol/L (ref 3.4–5.3)
Sodium: 142 mmol/L (ref 137–147)

## 2017-02-16 LAB — CBC AND DIFFERENTIAL
HCT: 42 % (ref 41–53)
Hemoglobin: 13.5 g/dL (ref 13.5–17.5)
NEUTROS ABS: 5 /uL
Platelets: 186 10*3/uL (ref 150–399)
WBC: 7.9 10*3/mL

## 2017-02-16 NOTE — Progress Notes (Signed)
Subjective: [name] is a 56 y.o. male patient seen in office for evaluation of ulceration of the right great toe and medial ankle. Patient has a history of diabetes and a blood glucose level  today not recorded but last A1c 9   Patient is changing the dressing using nothing because has been a dry scab. Was discharged from hospital on 01-27-17 on IV Vancomycin and Rocephin and oral Metronidazole for osteomyelitis of toe which was seen on MRI. Patient is currently being followed by Dr. Drucilla Schmidt from [place]. Denies nausea/fever/vomiting/chills/night sweats/shortness of breath/pain. Patient has no other pedal complaints at this time.  Patient Active Problem List   Diagnosis Date Noted  . Foot ulcer due to secondary DM ([place]) 01/26/2017  . Cellulitis of right leg   . Diabetic foot ulcer ([place]) 01/23/2017  . Cardiomyopathy, ischemic 12/07/2016  . S/P CABG x 3 09/18/2016  . Encounter for therapeutic drug monitoring 09/15/2016  . Coronary artery disease 08/24/2016  . Acute on chronic systolic CHF (congestive heart failure) ([place]) 06/17/2016  . PAF (paroxysmal atrial fibrillation) ([place]) 06/17/2016  . Smoking   . Non-ST elevation myocardial infarction (NSTEMI), subsequent episode of care ([employer]) 06/11/2016  . Acute respiratory failure with hypoxia ([place])   . Hypoxia   . Abscess of axilla, left 06/10/2016  . Type 2 diabetes mellitus, uncontrolled ([place]) 05/23/2013  . Essential hypertension 05/23/2013  . Pure hypercholesterolemia 05/23/2013   Current Outpatient Prescriptions on File Prior to Visit  Medication Sig Dispense Refill  . acetaminophen (TYLENOL) 500 MG tablet Take 1,000 mg by mouth every 6 (six) hours as needed for headache.    Marland Kitchen aspirin 81 MG chewable tablet Chew 1 tablet (81 mg total) by mouth daily. 30 tablet 3  . atorvastatin (LIPITOR) 80 MG tablet Take 1 tablet (80 mg total) by mouth at bedtime. 90 tablet 3  . canagliflozin (INVOKANA) 100 MG TABS tablet Take 1 tablet (100 mg total) by mouth  daily before breakfast. 30 tablet 3  . cefTRIAXone (ROCEPHIN) IVPB Inject 2 g into the vein daily. Indication:  Osteomyelitis  Last Day of Therapy:  03/06/2017 Labs - Once weekly:  CBC/D and BMP, Labs - Every other week:  ESR and CRP 42 Units 0  . glipiZIDE (GLUCOTROL) 10 MG tablet Take 1 tablet (10 mg total) by mouth 2 (two) times daily before a meal. 180 tablet 1  . glucose blood test strip Use as instructed 100 each 3  . hydrocerin (EUCERIN) CREA Apply 1 application topically 2 (two) times daily. Apply to both feet (including right great toe and medial malleolar wounds) twice daily. 113 g 0  . Insulin Glargine (BASAGLAR KWIKPEN) 100 UNIT/ML SOPN Inject 0.05 mLs (5 Units total) into the skin at bedtime. 3 mL 3  . Insulin Pen Needle 32G X 4 MM MISC Use to inject Insulin daily. Dx: E11.9 100 each 3  . Lancets (ONETOUCH ULTRASOFT) lancets Use as instructed 100 each 12  . metoprolol tartrate (LOPRESSOR) 25 MG tablet Take 1 tablet (25 mg total) by mouth 2 (two) times daily. 60 tablet 11  . metroNIDAZOLE (FLAGYL) 500 MG tablet Take 1 tablet (500 mg total) by mouth every 8 (eight) hours. 114 tablet 0  . pramipexole (MIRAPEX) 0.125 MG tablet Take 1 tablet (0.125 mg total) by mouth at bedtime. 30 tablet 2  . vancomycin IVPB Inject 1,000 mg into the vein every 12 (twelve) hours. Indication:  Osteomyelitis  Last Day of Therapy:  03/06/17 Labs - Sunday/Monday:  CBC/D, BMP,  and vancomycin trough. Labs - Thursday:  BMP and vancomycin trough Labs - Every other week:  ESR and CRP 84 Units 0   No current facility-administered medications on file prior to visit.    Allergies  Allergen Reactions  . Latex Rash    Recent Results (from the past 2160 hour(s))  POC HgB A1c     Status: None   Collection Time: 12/22/16  2:04 PM  Result Value Ref Range   Hemoglobin A1C 8.6   CBC with Differential/Platelet     Status: Abnormal   Collection Time: 01/18/17  9:38 AM  Result Value Ref Range   WBC 9.5 4.0 - 10.5  K/uL   RBC 5.49 4.22 - 5.81 Mil/uL   Hemoglobin 14.2 13.0 - 17.0 g/dL   HCT 44.2 39.0 - 52.0 %   MCV 80.5 78.0 - 100.0 fl   MCHC 32.3 30.0 - 36.0 g/dL   RDW 17.4 (H) 11.5 - 15.5 %   Platelets 151.0 150.0 - 400.0 K/uL   Neutrophils Relative % 58.8 43.0 - 77.0 %   Lymphocytes Relative 29.5 12.0 - 46.0 %   Monocytes Relative 8.3 3.0 - 12.0 %   Eosinophils Relative 2.9 0.0 - 5.0 %   Basophils Relative 0.5 0.0 - 3.0 %   Neutro Abs 5.6 1.4 - 7.7 K/uL   Lymphs Abs 2.8 0.7 - 4.0 K/uL   Monocytes Absolute 0.8 0.1 - 1.0 K/uL   Eosinophils Absolute 0.3 0.0 - 0.7 K/uL   Basophils Absolute 0.0 0.0 - 0.1 K/uL  TSH     Status: None   Collection Time: 01/18/17  9:38 AM  Result Value Ref Range   TSH 1.57 0.35 - 4.50 uIU/mL  Ferritin     Status: None   Collection Time: 01/18/17  9:38 AM  Result Value Ref Range   Ferritin 42.5 22.0 - 322.0 ng/mL  IBC panel     Status: Abnormal   Collection Time: 01/18/17  9:38 AM  Result Value Ref Range   Iron 48 42 - 165 ug/dL   Transferrin 296.0 212.0 - 360.0 mg/dL   Saturation Ratios 11.6 (L) 20.0 - 50.0 %  CBC with Differential     Status: Abnormal   Collection Time: 01/23/17  4:24 PM  Result Value Ref Range   WBC 14.7 (H) 4.0 - 10.5 K/uL   RBC 5.12 4.22 - 5.81 MIL/uL   Hemoglobin 13.4 13.0 - 17.0 g/dL   HCT 41.1 39.0 - 52.0 %   MCV 80.3 78.0 - 100.0 fL   MCH 26.2 26.0 - 34.0 pg   MCHC 32.6 30.0 - 36.0 g/dL   RDW 15.6 (H) 11.5 - 15.5 %   Platelets 121 (L) 150 - 400 K/uL   Neutrophils Relative % 75 %   Neutro Abs 11.1 (H) 1.7 - 7.7 K/uL   Lymphocytes Relative 13 %   Lymphs Abs 2.0 0.7 - 4.0 K/uL   Monocytes Relative 11 %   Monocytes Absolute 1.6 (H) 0.1 - 1.0 K/uL   Eosinophils Relative 1 %   Eosinophils Absolute 0.1 0.0 - 0.7 K/uL   Basophils Relative 0 %   Basophils Absolute 0.0 0.0 - 0.1 K/uL  Comprehensive metabolic panel     Status: Abnormal   Collection Time: 01/23/17  4:24 PM  Result Value Ref Range   Sodium 136 135 - 145 mmol/L    Potassium 3.6 3.5 - 5.1 mmol/L   Chloride 100 (L) 101 - 111 mmol/L   CO2 24 22 -  32 mmol/L   Glucose, Bld 428 (H) 65 - 99 mg/dL   BUN 13 6 - 20 mg/dL   Creatinine, Ser 1.30 (H) 0.61 - 1.24 mg/dL   Calcium 8.8 (L) 8.9 - 10.3 mg/dL   Total Protein 7.4 6.5 - 8.1 g/dL   Albumin 3.4 (L) 3.5 - 5.0 g/dL   AST 24 15 - 41 U/L   ALT 24 17 - 63 U/L   Alkaline Phosphatase 118 38 - 126 U/L   Total Bilirubin 0.9 0.3 - 1.2 mg/dL   GFR calc non Af Amer >60 >60 mL/min   GFR calc Af Amer >60 >60 mL/min    Comment: (NOTE) The eGFR has been calculated using the CKD EPI equation. This calculation has not been validated in all clinical situations. eGFR's persistently <60 mL/min signify possible Chronic Kidney Disease.    Anion gap 12 5 - 15  Blood culture (routine x 2)     Status: None   Collection Time: 01/23/17  4:29 PM  Result Value Ref Range   Specimen Description BLOOD LEFT ANTECUBITAL    Special Requests      BOTTLES DRAWN AEROBIC AND ANAEROBIC Blood Culture adequate volume   Culture NO GROWTH 5 DAYS    Report Status 01/28/2017 FINAL   I-Stat CG4 Lactic Acid, ED     Status: None   Collection Time: 01/23/17  4:36 PM  Result Value Ref Range   Lactic Acid, Venous 1.58 0.5 - 1.9 mmol/L  Blood culture (routine x 2)     Status: None   Collection Time: 01/23/17  5:15 PM  Result Value Ref Range   Specimen Description BLOOD RIGHT ANTECUBITAL    Special Requests      BOTTLES DRAWN AEROBIC AND ANAEROBIC Blood Culture adequate volume   Culture NO GROWTH 5 DAYS    Report Status 01/28/2017 FINAL   POC CBG, ED     Status: Abnormal   Collection Time: 01/23/17  7:32 PM  Result Value Ref Range   Glucose-Capillary 209 (H) 65 - 99 mg/dL  Glucose, capillary     Status: Abnormal   Collection Time: 01/23/17  8:12 PM  Result Value Ref Range   Glucose-Capillary 170 (H) 65 - 99 mg/dL  MRSA PCR Screening     Status: None   Collection Time: 01/23/17  8:23 PM  Result Value Ref Range   MRSA by PCR NEGATIVE  NEGATIVE    Comment:        The GeneXpert MRSA Assay (FDA approved for NASAL specimens only), is one component of a comprehensive MRSA colonization surveillance program. It is not intended to diagnose MRSA infection nor to guide or monitor treatment for MRSA infections.   Basic metabolic panel     Status: Abnormal   Collection Time: 01/23/17  8:42 PM  Result Value Ref Range   Sodium 141 135 - 145 mmol/L   Potassium 4.0 3.5 - 5.1 mmol/L   Chloride 107 101 - 111 mmol/L   CO2 25 22 - 32 mmol/L   Glucose, Bld 160 (H) 65 - 99 mg/dL   BUN 12 6 - 20 mg/dL   Creatinine, Ser 1.13 0.61 - 1.24 mg/dL   Calcium 8.7 (L) 8.9 - 10.3 mg/dL   GFR calc non Af Amer >60 >60 mL/min   GFR calc Af Amer >60 >60 mL/min    Comment: (NOTE) The eGFR has been calculated using the CKD EPI equation. This calculation has not been validated in all clinical situations. eGFR's persistently <  60 mL/min signify possible Chronic Kidney Disease.    Anion gap 9 5 - 15  CBC with Differential     Status: Abnormal   Collection Time: 01/23/17  8:42 PM  Result Value Ref Range   WBC 13.2 (H) 4.0 - 10.5 K/uL   RBC 5.02 4.22 - 5.81 MIL/uL   Hemoglobin 13.0 13.0 - 17.0 g/dL   HCT 40.1 39.0 - 52.0 %   MCV 79.9 78.0 - 100.0 fL   MCH 25.9 (L) 26.0 - 34.0 pg   MCHC 32.4 30.0 - 36.0 g/dL   RDW 15.8 (H) 11.5 - 15.5 %   Platelets 125 (L) 150 - 400 K/uL   Neutrophils Relative % 65 %   Lymphocytes Relative 23 %   Monocytes Relative 11 %   Eosinophils Relative 1 %   Basophils Relative 0 %   Neutro Abs 8.6 (H) 1.7 - 7.7 K/uL   Lymphs Abs 3.0 0.7 - 4.0 K/uL   Monocytes Absolute 1.5 (H) 0.1 - 1.0 K/uL   Eosinophils Absolute 0.1 0.0 - 0.7 K/uL   Basophils Absolute 0.0 0.0 - 0.1 K/uL   RBC Morphology POLYCHROMASIA PRESENT   Hemoglobin A1c     Status: Abnormal   Collection Time: 01/23/17  8:42 PM  Result Value Ref Range   Hgb A1c MFr Bld 9.5 (H) 4.8 - 5.6 %    Comment: (NOTE)         Pre-diabetes: 5.7 - 6.4          Diabetes: >6.4         Glycemic control for adults with diabetes: <7.0    Mean Plasma Glucose 226 mg/dL    Comment: (NOTE) Performed At: [facility] [place], [place] [id number] Lindon Romp MD BS:[id number]   HIV antibody     Status: None   Collection Time: 01/23/17  8:42 PM  Result Value Ref Range   HIV Screen 4th Generation wRfx Non Reactive Non Reactive    Comment: (NOTE) Performed At: [facility] [place], [place] [id number] Lindon Romp MD [id number]   Sedimentation rate     Status: Abnormal   Collection Time: 01/23/17  8:42 PM  Result Value Ref Range   Sed Rate 30 (H) 0 - 16 mm/hr  C-reactive protein     Status: Abnormal   Collection Time: 01/23/17  8:42 PM  Result Value Ref Range   CRP 8.8 (H) <1.0 mg/dL  Basic metabolic panel     Status: Abnormal   Collection Time: 01/24/17  4:46 AM  Result Value Ref Range   Sodium 140 135 - 145 mmol/L   Potassium 3.3 (L) 3.5 - 5.1 mmol/L    Comment: DELTA CHECK NOTED   Chloride 108 101 - 111 mmol/L   CO2 23 22 - 32 mmol/L   Glucose, Bld 86 65 - 99 mg/dL   BUN 15 6 - 20 mg/dL   Creatinine, Ser 1.19 0.61 - 1.24 mg/dL   Calcium 8.3 (L) 8.9 - 10.3 mg/dL   GFR calc non Af Amer >60 >60 mL/min   GFR calc Af Amer >60 >60 mL/min    Comment: (NOTE) The eGFR has been calculated using the CKD EPI equation. This calculation has not been validated in all clinical situations. eGFR's persistently <60 mL/min signify possible Chronic Kidney Disease.    Anion gap 9 5 - 15  CBC     Status: Abnormal   Collection Time: 01/24/17  4:46 AM  Result Value Ref Range   WBC 12.2 (H) 4.0 - 10.5 K/uL   RBC 5.00 4.22 - 5.81 MIL/uL   Hemoglobin 12.8 (L) 13.0 - 17.0 g/dL   HCT 39.9 39.0 - 52.0 %   MCV 79.8 78.0 - 100.0 fL   MCH 25.6 (L) 26.0 - 34.0 pg   MCHC 32.1 30.0 - 36.0 g/dL   RDW 16.0 (H) 11.5 - 15.5 %   Platelets 117 (L) 150 - 400 K/uL    Comment: REPEATED TO VERIFY SPECIMEN CHECKED  FOR CLOTS PLATELET COUNT CONFIRMED BY SMEAR   Glucose, capillary     Status: None   Collection Time: 01/24/17  8:32 AM  Result Value Ref Range   Glucose-Capillary 99 65 - 99 mg/dL  Glucose, capillary     Status: Abnormal   Collection Time: 01/24/17 12:05 PM  Result Value Ref Range   Glucose-Capillary 199 (H) 65 - 99 mg/dL  Glucose, capillary     Status: Abnormal   Collection Time: 01/24/17  5:08 PM  Result Value Ref Range   Glucose-Capillary 220 (H) 65 - 99 mg/dL  Glucose, capillary     Status: Abnormal   Collection Time: 01/24/17  9:52 PM  Result Value Ref Range   Glucose-Capillary 241 (H) 65 - 99 mg/dL  Glucose, capillary     Status: Abnormal   Collection Time: 01/25/17  7:58 AM  Result Value Ref Range   Glucose-Capillary 250 (H) 65 - 99 mg/dL  Glucose, capillary     Status: Abnormal   Collection Time: 01/25/17 12:17 PM  Result Value Ref Range   Glucose-Capillary 256 (H) 65 - 99 mg/dL  Glucose, capillary     Status: Abnormal   Collection Time: 01/25/17  5:17 PM  Result Value Ref Range   Glucose-Capillary 205 (H) 65 - 99 mg/dL  Glucose, capillary     Status: Abnormal   Collection Time: 01/25/17  9:53 PM  Result Value Ref Range   Glucose-Capillary 287 (H) 65 - 99 mg/dL  Glucose, capillary     Status: Abnormal   Collection Time: 01/26/17  8:04 AM  Result Value Ref Range   Glucose-Capillary 167 (H) 65 - 99 mg/dL  Glucose, capillary     Status: Abnormal   Collection Time: 01/26/17 12:05 PM  Result Value Ref Range   Glucose-Capillary 254 (H) 65 - 99 mg/dL  Basic metabolic panel     Status: Abnormal   Collection Time: 01/26/17  1:31 PM  Result Value Ref Range   Sodium 139 135 - 145 mmol/L   Potassium 4.4 3.5 - 5.1 mmol/L   Chloride 102 101 - 111 mmol/L   CO2 26 22 - 32 mmol/L   Glucose, Bld 281 (H) 65 - 99 mg/dL   BUN 17 6 - 20 mg/dL   Creatinine, Ser 1.19 0.61 - 1.24 mg/dL   Calcium 9.6 8.9 - 10.3 mg/dL   GFR calc non Af Amer >60 >60 mL/min   GFR calc Af Amer >60  >60 mL/min    Comment: (NOTE) The eGFR has been calculated using the CKD EPI equation. This calculation has not been validated in all clinical situations. eGFR's persistently <60 mL/min signify possible Chronic Kidney Disease.    Anion gap 11 5 - 15  Sedimentation rate     Status: Abnormal   Collection Time: 01/26/17  1:31 PM  Result Value Ref Range   Sed Rate 40 (H) 0 - 16 mm/hr  C-reactive protein     Status: Abnormal  Collection Time: 01/26/17  1:31 PM  Result Value Ref Range   CRP 7.2 (H) <1.0 mg/dL  Prealbumin     Status: Abnormal   Collection Time: 01/26/17  1:31 PM  Result Value Ref Range   Prealbumin 15.7 (L) 18 - 38 mg/dL  Vancomycin, trough     Status: None   Collection Time: 01/26/17  3:57 PM  Result Value Ref Range   Vancomycin Tr 19 15 - 20 ug/mL  Glucose, capillary     Status: Abnormal   Collection Time: 01/26/17  5:07 PM  Result Value Ref Range   Glucose-Capillary 211 (H) 65 - 99 mg/dL  Glucose, capillary     Status: Abnormal   Collection Time: 01/26/17  9:23 PM  Result Value Ref Range   Glucose-Capillary 247 (H) 65 - 99 mg/dL  Glucose, capillary     Status: Abnormal   Collection Time: 01/27/17  7:50 AM  Result Value Ref Range   Glucose-Capillary 138 (H) 65 - 99 mg/dL  Glucose, capillary     Status: Abnormal   Collection Time: 01/27/17 11:53 AM  Result Value Ref Range   Glucose-Capillary 183 (H) 65 - 99 mg/dL  Glucose, capillary     Status: Abnormal   Collection Time: 01/27/17  5:41 PM  Result Value Ref Range   Glucose-Capillary 222 (H) 65 - 99 mg/dL  CBC and differential     Status: Abnormal   Collection Time: 02/01/17 12:00 AM  Result Value Ref Range   Hemoglobin 12.1 (A) 13.5 - 17.5 g/dL   HCT 38 (A) 41 - 53 %   Neutrophils Absolute 6 /L   Platelets 180 150 - 399 K/L   WBC 9.1 03^8/UE  Basic metabolic panel     Status: None   Collection Time: 02/01/17 12:00 AM  Result Value Ref Range   BUN 14 4 - 21 mg/dL   Creatinine 1.1 0.6 - 1.3 mg/dL    Potassium 4.2 3.4 - 5.3 mmol/L   Sodium 140 137 - 147 mmol/L  Basic metabolic panel     Status: Abnormal   Collection Time: 02/02/17 12:46 PM  Result Value Ref Range   Sodium 140 135 - 145 mEq/L   Potassium 3.8 3.5 - 5.1 mEq/L   Chloride 106 96 - 112 mEq/L   CO2 27 19 - 32 mEq/L   Glucose, Bld 192 (H) 70 - 99 mg/dL   BUN 15 6 - 23 mg/dL   Creatinine, Ser 1.12 0.40 - 1.50 mg/dL   Calcium 8.4 8.4 - 10.5 mg/dL   GFR 72.17 >60.00 mL/min  CBC with Differential/Platelet     Status: Abnormal   Collection Time: 02/02/17 12:46 PM  Result Value Ref Range   WBC 8.3 4.0 - 10.5 K/uL   RBC 4.80 4.22 - 5.81 Mil/uL   Hemoglobin 12.5 (L) 13.0 - 17.0 g/dL   HCT 38.7 (L) 39.0 - 52.0 %   MCV 80.8 78.0 - 100.0 fl   MCHC 32.2 30.0 - 36.0 g/dL   RDW 16.1 (H) 11.5 - 15.5 %   Platelets 183.0 150.0 - 400.0 K/uL   Neutrophils Relative % 62.6 43.0 - 77.0 %   Lymphocytes Relative 25.6 12.0 - 46.0 %   Monocytes Relative 7.9 3.0 - 12.0 %   Eosinophils Relative 3.1 0.0 - 5.0 %   Basophils Relative 0.8 0.0 - 3.0 %   Neutro Abs 5.2 1.4 - 7.7 K/uL   Lymphs Abs 2.1 0.7 - 4.0 K/uL   Monocytes Absolute 0.7  0.1 - 1.0 K/uL   Eosinophils Absolute 0.3 0.0 - 0.7 K/uL   Basophils Absolute 0.1 0.0 - 0.1 K/uL  POC Glucose (CBG)     Status: Abnormal   Collection Time: 02/02/17  1:58 PM  Result Value Ref Range   POC Glucose 198 (A) 70 - 99 mg/dl  CBC and differential     Status: Abnormal   Collection Time: 02/04/17 12:00 AM  Result Value Ref Range   Hemoglobin 12.3 (A) 13.5 - 17.5 g/dL   HCT 39 (A) 41 - 53 %   Neutrophils Absolute 5 /L   Platelets 230 150 - 399 K/L   WBC 8.2 70^0/FV  Basic metabolic panel     Status: None   Collection Time: 02/04/17 12:00 AM  Result Value Ref Range   Glucose 229 mg/dL   BUN 17 4 - 21 mg/dL   Creatinine 1.1 0.6 - 1.3 mg/dL   Potassium 4.3 3.4 - 5.3 mmol/L   Sodium 141 137 - 147 mmol/L  CBC and differential     Status: Abnormal   Collection Time: 02/08/17 12:00 AM  Result  Value Ref Range   Hemoglobin 12.8 (A) 13.5 - 17.5 g/dL   HCT 38 (A) 41 - 53 %   Neutrophils Absolute 8 /L   Platelets 239 150 - 399 K/L   WBC 10.5 49^4/WH  Basic metabolic panel     Status: None   Collection Time: 02/08/17 12:00 AM  Result Value Ref Range   Glucose 226 mg/dL   BUN 18 4 - 21 mg/dL   Creatinine 0.9 0.6 - 1.3 mg/dL   Potassium 4.9 3.4 - 5.3 mmol/L   Sodium 139 137 - 147 mmol/L  CBC and differential     Status: None   Collection Time: 02/16/17 12:00 AM  Result Value Ref Range   Hemoglobin 13.5 13.5 - 17.5 g/dL   HCT 42 41 - 53 %   Neutrophils Absolute 5 /L   Platelets 186 150 - 399 K/L   WBC 7.9 67^5/FF  Basic metabolic panel     Status: None   Collection Time: 02/16/17 12:00 AM  Result Value Ref Range   Glucose 204 mg/dL   BUN 15 4 - 21 mg/dL   Creatinine 0.8 0.6 - 1.3 mg/dL   Potassium 4.6 3.4 - 5.3 mmol/L   Sodium 142 137 - 147 mmol/L    Objective: There were no vitals filed for this visit.  General: Patient is awake, alert, oriented x 3 and in no acute distress.  Dermatology: Skin is warm and dry bilateral with scabbed over dry ulcertion at right 1st toe and medial ankle on right. There is no malodor, no active drainage, no erythema, no edema. No acute signs of infection.   Vascular: Dorsalis Pedis pulse = 1/4 Bilateral,  Posterior Tibial pulse = 1/4 Bilateral,  Capillary Fill Time < 5 seconds  Neurologic: Protective sensation diminished bilateral.   Musculosketal: No Pain with palpation to ulcerated area. No pain with compression to calves bilateral. No gross bony deformities noted bilateral.  Xrays, Right foot: No aggressive bony destruction suggestive of osteomyelitis however MRI +. No gas in soft tissues.    Recent Labs  06/10/16 2050 06/11/16 1231  GRAMSTAIN ABUNDANT WBC PRESENT,BOTH PMN AND MONONUCLEARABUNDANT GRAM POSITIVE COCCI IN CLUSTERS IN PAIRS NO WBC SEENABUNDANT GRAM POSITIVE COCCI IN PAIRS IN CL[facility]er[facility] at [place]    Assessment and Plan:  Problem  List Items Addressed This Visit    None    Visit Diagnoses    Other acute osteomyelitis of right foot ([place])    -  Primary   Relevant Orders   DG Foot Complete Right (Completed)   Cellulitis and abscess of foot, except toes       Pain of toe of right foot           -Examined patient and discussed the progression of the wound and treatment alternatives. -Xrays reviewed - Excisionally dedbrided dry ulcerated areas with no opening underneath; Advised clean dry sock -Continue IV antibiotics and ID follow up -Continue with post op shoe or shoe that does not rub or irritate areas  - Advised patient to go to the ER or return to office if the wound worsens or if constitutional symptoms are present. -Patient to return to office in 3 weeks for follow up care/xrays to ensure that osteomyelitis is not advancing or changing or sooner if problems arise. Patient may benefit from diabetic shoes in future.  Landis Martins, DPM

## 2017-02-18 ENCOUNTER — Telehealth: Payer: Self-pay | Admitting: *Deleted

## 2017-02-18 ENCOUNTER — Ambulatory Visit (INDEPENDENT_AMBULATORY_CARE_PROVIDER_SITE_OTHER): Payer: BLUE CROSS/BLUE SHIELD | Admitting: Internal Medicine

## 2017-02-18 ENCOUNTER — Encounter: Payer: Self-pay | Admitting: Internal Medicine

## 2017-02-18 DIAGNOSIS — Z95828 Presence of other vascular implants and grafts: Secondary | ICD-10-CM

## 2017-02-18 DIAGNOSIS — E08621 Diabetes mellitus due to underlying condition with foot ulcer: Secondary | ICD-10-CM | POA: Diagnosis not present

## 2017-02-18 DIAGNOSIS — L97518 Non-pressure chronic ulcer of other part of right foot with other specified severity: Secondary | ICD-10-CM | POA: Diagnosis not present

## 2017-02-18 DIAGNOSIS — M86171 Other acute osteomyelitis, right ankle and foot: Secondary | ICD-10-CM | POA: Diagnosis not present

## 2017-02-18 DIAGNOSIS — M869 Osteomyelitis, unspecified: Secondary | ICD-10-CM | POA: Insufficient documentation

## 2017-02-18 DIAGNOSIS — Z5181 Encounter for therapeutic drug level monitoring: Secondary | ICD-10-CM

## 2017-02-18 NOTE — Telephone Encounter (Signed)
Verbal order per Dr. Linus Salmons given to [facility] at [place] to pull patient's picc line at the end of treatment on 03/06/17. [name]

## 2017-02-18 NOTE — Assessment & Plan Note (Signed)
Discussed importance of sugar control on limiting infections

## 2017-02-18 NOTE — Assessment & Plan Note (Signed)
Will have this removed by [facility] at the end of treatment on May 18.

## 2017-02-18 NOTE — Progress Notes (Signed)
   Subjective:    Patient ID: [name], male    DOB: 08/21/1961, 55 y.o.   MRN: [id number]  HPI Here for follow up of osteomyelitis.  He has a history of CAD with CABG following MI and poorly controlled diabetes with A1C as high as 13 and developed a toe ulcer that lead to osteomyelitis. He is on vancomycin, ceftriaxone and oral flagyl and taking well.  He is improving his sugar control and labs are reassuring with CRP 1.9 and ESR of 33.  Feels his toe is improving though some swelling remains but much improved. No fever or chills.  No associated n/v/d/rash.    Review of Systems  Gastrointestinal: Negative for abdominal distention and nausea.  Skin: Negative for rash.       Objective:   Physical Exam  Constitutional: He appears well-developed and well-nourished. No distress.  HENT:  Mouth/Throat: No oropharyngeal exudate.  Eyes: No scleral icterus.  Cardiovascular: Normal rate, regular rhythm and normal heart sounds.   No murmur heard. Pulmonary/Chest: Effort normal and breath sounds normal. No respiratory distress.  Skin: No rash noted.   SH: + tobacco       Assessment & Plan:   

## 2017-02-18 NOTE — Assessment & Plan Note (Signed)
Trough levels noted and will continue to monitor with trough goal of 15-20; creat stable

## 2017-02-18 NOTE — Assessment & Plan Note (Signed)
Improving and area closed.  Will continue with antibiotics through May 18th and can stop.   He has follow up with Podiatry at that time so will not arrange follow up here unless concerns noted.

## 2017-02-19 ENCOUNTER — Telehealth: Payer: Self-pay | Admitting: Adult Health

## 2017-02-19 NOTE — Telephone Encounter (Signed)
° ° °  Ria Comment with [facility] call to sat pt blood sugar has been out of control. She said it has been 200 to 300    [phone]

## 2017-02-19 NOTE — Telephone Encounter (Signed)
I received verbal from [facility] to have patient increase Basaglar to 10 units - and then increase by 1 unit every 3 days. If patient gets up to 20 units and blood sugar is still not under control, then patient needs to follow up.  I notified patient of these comments and recommendations, and patient verbalized understanding.

## 2017-02-22 LAB — CBC AND DIFFERENTIAL
HEMATOCRIT: 39 % — AB (ref 41–53)
Hemoglobin: 13.1 g/dL — AB (ref 13.5–17.5)
Neutrophils Absolute: 4 /uL
Platelets: 121 10*3/uL — AB (ref 150–399)
WBC: 7.2 10*3/mL

## 2017-02-22 LAB — BASIC METABOLIC PANEL
BUN: 12 mg/dL (ref 4–21)
CREATININE: 0.9 mg/dL (ref 0.6–1.3)
GLUCOSE: 183 mg/dL
POTASSIUM: 3.3 mmol/L — AB (ref 3.4–5.3)
SODIUM: 142 mmol/L (ref 137–147)

## 2017-02-24 ENCOUNTER — Other Ambulatory Visit: Payer: Self-pay | Admitting: Neurology

## 2017-02-24 MED ORDER — PRAMIPEXOLE DIHYDROCHLORIDE 0.125 MG PO TABS: 0.1250 mg | ORAL_TABLET | Freq: Every day | ORAL | 0 refills | Status: DC

## 2017-02-25 LAB — POCT ERYTHROCYTE SEDIMENTATION RATE, NON-AUTOMATED: SED RATE: 15 mm

## 2017-02-26 ENCOUNTER — Encounter: Payer: Self-pay | Admitting: Family Medicine

## 2017-03-01 ENCOUNTER — Encounter: Payer: Self-pay | Admitting: Family Medicine

## 2017-03-01 LAB — CBC AND DIFFERENTIAL
HEMATOCRIT: 41 % (ref 41–53)
HEMOGLOBIN: 13.3 g/dL — AB (ref 13.5–17.5)
NEUTROS ABS: 5 /uL
Platelets: 127 10*3/uL — AB (ref 150–399)
WBC: 7.9 10*3/mL

## 2017-03-01 LAB — BASIC METABOLIC PANEL
BUN: 18 mg/dL (ref 4–21)
Creatinine: 0.9 mg/dL (ref 0.6–1.3)
GLUCOSE: 283 mg/dL
POTASSIUM: 3.5 mmol/L (ref 3.4–5.3)
SODIUM: 144 mmol/L (ref 137–147)

## 2017-03-02 ENCOUNTER — Other Ambulatory Visit: Payer: Self-pay | Admitting: Adult Health

## 2017-03-02 DIAGNOSIS — IMO0001 Reserved for inherently not codable concepts without codable children: Secondary | ICD-10-CM

## 2017-03-02 DIAGNOSIS — E1165 Type 2 diabetes mellitus with hyperglycemia: Principal | ICD-10-CM

## 2017-03-03 ENCOUNTER — Other Ambulatory Visit: Payer: Self-pay | Admitting: Adult Health

## 2017-03-03 ENCOUNTER — Encounter: Payer: Self-pay | Admitting: Family Medicine

## 2017-03-03 DIAGNOSIS — E1165 Type 2 diabetes mellitus with hyperglycemia: Principal | ICD-10-CM

## 2017-03-03 DIAGNOSIS — IMO0001 Reserved for inherently not codable concepts without codable children: Secondary | ICD-10-CM

## 2017-03-04 ENCOUNTER — Telehealth: Payer: Self-pay | Admitting: Adult Health

## 2017-03-04 NOTE — Telephone Encounter (Signed)
Sharee Pimple the RN is calling stating that she was not able to get and blood from the piccline and did not have any butterfly needles she will be going back out on Friday to do this draw.  She will be going to draw on Monday CBC and BMP and pt would be finish is antibiotic by then and she is wanting to know when the piccline will be removed?

## 2017-03-04 NOTE — Telephone Encounter (Signed)
Per note on 02/18/17 by Dr. Henreitta Leber office, piccline is to be removed at the end of treatment on 03/06/17. I advised Sharee Pimple of this and she verbalized understanding. She states she will call over to that office to discuss further and receive verbal from their office.  Nothing further needed. Thanks!

## 2017-03-05 LAB — POCT ERYTHROCYTE SEDIMENTATION RATE, NON-AUTOMATED: Sed Rate: 12 mm

## 2017-03-08 LAB — BASIC METABOLIC PANEL
BUN: 14 mg/dL (ref 4–21)
CREATININE: 0.9 mg/dL (ref 0.6–1.3)
GLUCOSE: 326 mg/dL
Potassium: 3.6 mmol/L (ref 3.4–5.3)
Sodium: 146 mmol/L (ref 137–147)

## 2017-03-08 LAB — CBC AND DIFFERENTIAL
HEMATOCRIT: 42 % (ref 41–53)
Hemoglobin: 13.1 g/dL — AB (ref 13.5–17.5)
Neutrophils Absolute: 5 /uL
Platelets: 133 10*3/uL — AB (ref 150–399)
WBC: 7.9 10*3/mL

## 2017-03-09 ENCOUNTER — Ambulatory Visit (INDEPENDENT_AMBULATORY_CARE_PROVIDER_SITE_OTHER): Payer: BLUE CROSS/BLUE SHIELD | Admitting: Sports Medicine

## 2017-03-09 ENCOUNTER — Encounter: Payer: Self-pay | Admitting: Sports Medicine

## 2017-03-09 DIAGNOSIS — L02619 Cutaneous abscess of unspecified foot: Secondary | ICD-10-CM

## 2017-03-09 DIAGNOSIS — M86171 Other acute osteomyelitis, right ankle and foot: Secondary | ICD-10-CM | POA: Diagnosis not present

## 2017-03-09 DIAGNOSIS — E1100 Type 2 diabetes mellitus with hyperosmolarity without nonketotic hyperglycemic-hyperosmolar coma (NKHHC): Secondary | ICD-10-CM

## 2017-03-09 DIAGNOSIS — L03119 Cellulitis of unspecified part of limb: Secondary | ICD-10-CM | POA: Diagnosis not present

## 2017-03-09 MED ORDER — PROBIOTIC 250 MG PO CAPS: 250.0000 mg | ORAL_CAPSULE | Freq: Every day | ORAL | 0 refills | Status: DC

## 2017-03-09 NOTE — Progress Notes (Signed)
Subjective: [name] is a 56 y.o. male patient seen in office for evaluation of osteomyelitis of right 1st toe. Patient completed  IV Vancomycin and Rocephin on 03-06-17 6 weeks and oral Metronidazole for osteomyelitis of toe which was seen on MRI, which is to be completed on next week. Patient is currently being followed by Dr. Drucilla Schmidt from [place]. Denies nausea/fever/vomiting/chills/night sweats/shortness of breath/pain. Admits diarrhea. Patient has no other pedal complaints at this time.  Patient Active Problem List   Diagnosis Date Noted  . Osteomyelitis of ankle or foot, right, acute ([place]) 02/18/2017  . S/P PICC central line placement 02/18/2017  . Foot ulcer due to secondary DM ([place]) 01/26/2017  . Cellulitis of right leg   . Diabetic foot ulcer ([place]) 01/23/2017  . Cardiomyopathy, ischemic 12/07/2016  . S/P CABG x 3 09/18/2016  . Encounter for therapeutic drug monitoring 09/15/2016  . Coronary artery disease 08/24/2016  . Acute on chronic systolic CHF (congestive heart failure) ([place]) 06/17/2016  . PAF (paroxysmal atrial fibrillation) ([place]) 06/17/2016  . Smoking   . Non-ST elevation myocardial infarction (NSTEMI), subsequent episode of care ([place]) 06/11/2016  . Hypoxia   . Abscess of axilla, left 06/10/2016  . Type 2 diabetes mellitus, uncontrolled ([place]) 05/23/2013  . Essential hypertension 05/23/2013  . Pure hypercholesterolemia 05/23/2013   Current Outpatient Prescriptions on File Prior to Visit  Medication Sig Dispense Refill  . acetaminophen (TYLENOL) 500 MG tablet Take 1,000 mg by mouth every 6 (six) hours as needed for headache.    Marland Kitchen aspirin 81 MG chewable tablet Chew 1 tablet (81 mg total) by mouth daily. 30 tablet 3  . atorvastatin (LIPITOR) 80 MG tablet Take 1 tablet (80 mg total) by mouth at bedtime. 90 tablet 3  . glipiZIDE (GLUCOTROL) 10 MG tablet Take 1 tablet (10 mg total) by mouth 2 (two) times daily before a meal. 180 tablet 1  . glucose blood test strip Use as  instructed 100 each 3  . hydrocerin (EUCERIN) CREA Apply 1 application topically 2 (two) times daily. Apply to both feet (including right great toe and medial malleolar wounds) twice daily. 113 g 0  . Insulin Glargine (BASAGLAR KWIKPEN) 100 UNIT/ML SOPN Inject 0.05 mLs (5 Units total) into the skin at bedtime. 3 mL 3  . Insulin Pen Needle 32G X 4 MM MISC Use to inject Insulin daily. Dx: E11.9 100 each 3  . INVOKANA 100 MG TABS tablet TAKE 1 TABLET BY MOUTH EVERY DAY BEFORE BREAKFAST 30 tablet 1  . Lancets (ONETOUCH ULTRASOFT) lancets Use as instructed 100 each 12  . metoprolol tartrate (LOPRESSOR) 25 MG tablet Take 1 tablet (25 mg total) by mouth 2 (two) times daily. 60 tablet 11  . metroNIDAZOLE (FLAGYL) 500 MG tablet Take 1 tablet (500 mg total) by mouth every 8 (eight) hours. 114 tablet 0  . pramipexole (MIRAPEX) 0.125 MG tablet Take 1 tablet (0.125 mg total) by mouth at bedtime. 90 tablet 0   No current facility-administered medications on file prior to visit.    Allergies  Allergen Reactions  . Latex Rash    Recent Results (from the past 2160 hour(s))  POC HgB A1c     Status: None   Collection Time: 12/22/16  2:04 PM  Result Value Ref Range   Hemoglobin A1C 8.6   CBC with Differential/Platelet     Status: Abnormal   Collection Time: 01/18/17  9:38 AM  Result Value Ref Range   WBC 9.5 4.0 - 10.5 K/uL  RBC 5.49 4.22 - 5.81 Mil/uL   Hemoglobin 14.2 13.0 - 17.0 g/dL   HCT 44.2 39.0 - 52.0 %   MCV 80.5 78.0 - 100.0 fl   MCHC 32.3 30.0 - 36.0 g/dL   RDW 17.4 (H) 11.5 - 15.5 %   Platelets 151.0 150.0 - 400.0 K/uL   Neutrophils Relative % 58.8 43.0 - 77.0 %   Lymphocytes Relative 29.5 12.0 - 46.0 %   Monocytes Relative 8.3 3.0 - 12.0 %   Eosinophils Relative 2.9 0.0 - 5.0 %   Basophils Relative 0.5 0.0 - 3.0 %   Neutro Abs 5.6 1.4 - 7.7 K/uL   Lymphs Abs 2.8 0.7 - 4.0 K/uL   Monocytes Absolute 0.8 0.1 - 1.0 K/uL   Eosinophils Absolute 0.3 0.0 - 0.7 K/uL   Basophils Absolute  0.0 0.0 - 0.1 K/uL  TSH     Status: None   Collection Time: 01/18/17  9:38 AM  Result Value Ref Range   TSH 1.57 0.35 - 4.50 uIU/mL  Ferritin     Status: None   Collection Time: 01/18/17  9:38 AM  Result Value Ref Range   Ferritin 42.5 22.0 - 322.0 ng/mL  IBC panel     Status: Abnormal   Collection Time: 01/18/17  9:38 AM  Result Value Ref Range   Iron 48 42 - 165 ug/dL   Transferrin 296.0 212.0 - 360.0 mg/dL   Saturation Ratios 11.6 (L) 20.0 - 50.0 %  CBC with Differential     Status: Abnormal   Collection Time: 01/23/17  4:24 PM  Result Value Ref Range   WBC 14.7 (H) 4.0 - 10.5 K/uL   RBC 5.12 4.22 - 5.81 MIL/uL   Hemoglobin 13.4 13.0 - 17.0 g/dL   HCT 41.1 39.0 - 52.0 %   MCV 80.3 78.0 - 100.0 fL   MCH 26.2 26.0 - 34.0 pg   MCHC 32.6 30.0 - 36.0 g/dL   RDW 15.6 (H) 11.5 - 15.5 %   Platelets 121 (L) 150 - 400 K/uL   Neutrophils Relative % 75 %   Neutro Abs 11.1 (H) 1.7 - 7.7 K/uL   Lymphocytes Relative 13 %   Lymphs Abs 2.0 0.7 - 4.0 K/uL   Monocytes Relative 11 %   Monocytes Absolute 1.6 (H) 0.1 - 1.0 K/uL   Eosinophils Relative 1 %   Eosinophils Absolute 0.1 0.0 - 0.7 K/uL   Basophils Relative 0 %   Basophils Absolute 0.0 0.0 - 0.1 K/uL  Comprehensive metabolic panel     Status: Abnormal   Collection Time: 01/23/17  4:24 PM  Result Value Ref Range   Sodium 136 135 - 145 mmol/L   Potassium 3.6 3.5 - 5.1 mmol/L   Chloride 100 (L) 101 - 111 mmol/L   CO2 24 22 - 32 mmol/L   Glucose, Bld 428 (H) 65 - 99 mg/dL   BUN 13 6 - 20 mg/dL   Creatinine, Ser 1.30 (H) 0.61 - 1.24 mg/dL   Calcium 8.8 (L) 8.9 - 10.3 mg/dL   Total Protein 7.4 6.5 - 8.1 g/dL   Albumin 3.4 (L) 3.5 - 5.0 g/dL   AST 24 15 - 41 U/L   ALT 24 17 - 63 U/L   Alkaline Phosphatase 118 38 - 126 U/L   Total Bilirubin 0.9 0.3 - 1.2 mg/dL   GFR calc non Af Amer >60 >60 mL/min   GFR calc Af Amer >60 >60 mL/min    Comment: (NOTE) The eGFR  has been calculated using the CKD EPI equation. This calculation has  not been validated in all clinical situations. eGFR's persistently <60 mL/min signify possible Chronic Kidney Disease.    Anion gap 12 5 - 15  Blood culture (routine x 2)     Status: None   Collection Time: 01/23/17  4:29 PM  Result Value Ref Range   Specimen Description BLOOD LEFT ANTECUBITAL    Special Requests      BOTTLES DRAWN AEROBIC AND ANAEROBIC Blood Culture adequate volume   Culture NO GROWTH 5 DAYS    Report Status 01/28/2017 FINAL   I-Stat CG4 Lactic Acid, ED     Status: None   Collection Time: 01/23/17  4:36 PM  Result Value Ref Range   Lactic Acid, Venous 1.58 0.5 - 1.9 mmol/L  Blood culture (routine x 2)     Status: None   Collection Time: 01/23/17  5:15 PM  Result Value Ref Range   Specimen Description BLOOD RIGHT ANTECUBITAL    Special Requests      BOTTLES DRAWN AEROBIC AND ANAEROBIC Blood Culture adequate volume   Culture NO GROWTH 5 DAYS    Report Status 01/28/2017 FINAL   POC CBG, ED     Status: Abnormal   Collection Time: 01/23/17  7:32 PM  Result Value Ref Range   Glucose-Capillary 209 (H) 65 - 99 mg/dL  Glucose, capillary     Status: Abnormal   Collection Time: 01/23/17  8:12 PM  Result Value Ref Range   Glucose-Capillary 170 (H) 65 - 99 mg/dL  MRSA PCR Screening     Status: None   Collection Time: 01/23/17  8:23 PM  Result Value Ref Range   MRSA by PCR NEGATIVE NEGATIVE    Comment:        The GeneXpert MRSA Assay (FDA approved for NASAL specimens only), is one component of a comprehensive MRSA colonization surveillance program. It is not intended to diagnose MRSA infection nor to guide or monitor treatment for MRSA infections.   Basic metabolic panel     Status: Abnormal   Collection Time: 01/23/17  8:42 PM  Result Value Ref Range   Sodium 141 135 - 145 mmol/L   Potassium 4.0 3.5 - 5.1 mmol/L   Chloride 107 101 - 111 mmol/L   CO2 25 22 - 32 mmol/L   Glucose, Bld 160 (H) 65 - 99 mg/dL   BUN 12 6 - 20 mg/dL   Creatinine, Ser 1.13 0.61 -  1.24 mg/dL   Calcium 8.7 (L) 8.9 - 10.3 mg/dL   GFR calc non Af Amer >60 >60 mL/min   GFR calc Af Amer >60 >60 mL/min    Comment: (NOTE) The eGFR has been calculated using the CKD EPI equation. This calculation has not been validated in all clinical situations. eGFR's persistently <60 mL/min signify possible Chronic Kidney Disease.    Anion gap 9 5 - 15  CBC with Differential     Status: Abnormal   Collection Time: 01/23/17  8:42 PM  Result Value Ref Range   WBC 13.2 (H) 4.0 - 10.5 K/uL   RBC 5.02 4.22 - 5.81 MIL/uL   Hemoglobin 13.0 13.0 - 17.0 g/dL   HCT 40.1 39.0 - 52.0 %   MCV 79.9 78.0 - 100.0 fL   MCH 25.9 (L) 26.0 - 34.0 pg   MCHC 32.4 30.0 - 36.0 g/dL   RDW 15.8 (H) 11.5 - 15.5 %   Platelets 125 (L) 150 - 400 K/uL  Neutrophils Relative % 65 %   Lymphocytes Relative 23 %   Monocytes Relative 11 %   Eosinophils Relative 1 %   Basophils Relative 0 %   Neutro Abs 8.6 (H) 1.7 - 7.7 K/uL   Lymphs Abs 3.0 0.7 - 4.0 K/uL   Monocytes Absolute 1.5 (H) 0.1 - 1.0 K/uL   Eosinophils Absolute 0.1 0.0 - 0.7 K/uL   Basophils Absolute 0.0 0.0 - 0.1 K/uL   RBC Morphology POLYCHROMASIA PRESENT   Hemoglobin A1c     Status: Abnormal   Collection Time: 01/23/17  8:42 PM  Result Value Ref Range   Hgb A1c MFr Bld 9.5 (H) 4.8 - 5.6 %    Comment: (NOTE)         Pre-diabetes: 5.7 - 6.4         Diabetes: >6.4         Glycemic control for adults with diabetes: <7.0    Mean Plasma Glucose 226 mg/dL    Comment: (NOTE) Performed At: [facility] [place], [place] [id number] Lindon Romp MD [id number]   HIV antibody     Status: None   Collection Time: 01/23/17  8:42 PM  Result Value Ref Range   HIV Screen 4th Generation wRfx Non Reactive Non Reactive    Comment: (NOTE) Performed At: [facility] [place], [place] [id number] Lindon Romp MD [id number]   Sedimentation rate     Status: Abnormal   Collection Time: 01/23/17  8:42  PM  Result Value Ref Range   Sed Rate 30 (H) 0 - 16 mm/hr  C-reactive protein     Status: Abnormal   Collection Time: 01/23/17  8:42 PM  Result Value Ref Range   CRP 8.8 (H) <1.0 mg/dL  Basic metabolic panel     Status: Abnormal   Collection Time: 01/24/17  4:46 AM  Result Value Ref Range   Sodium 140 135 - 145 mmol/L   Potassium 3.3 (L) 3.5 - 5.1 mmol/L    Comment: DELTA CHECK NOTED   Chloride 108 101 - 111 mmol/L   CO2 23 22 - 32 mmol/L   Glucose, Bld 86 65 - 99 mg/dL   BUN 15 6 - 20 mg/dL   Creatinine, Ser 1.19 0.61 - 1.24 mg/dL   Calcium 8.3 (L) 8.9 - 10.3 mg/dL   GFR calc non Af Amer >60 >60 mL/min   GFR calc Af Amer >60 >60 mL/min    Comment: (NOTE) The eGFR has been calculated using the CKD EPI equation. This calculation has not been validated in all clinical situations. eGFR's persistently <60 mL/min signify possible Chronic Kidney Disease.    Anion gap 9 5 - 15  CBC     Status: Abnormal   Collection Time: 01/24/17  4:46 AM  Result Value Ref Range   WBC 12.2 (H) 4.0 - 10.5 K/uL   RBC 5.00 4.22 - 5.81 MIL/uL   Hemoglobin 12.8 (L) 13.0 - 17.0 g/dL   HCT 39.9 39.0 - 52.0 %   MCV 79.8 78.0 - 100.0 fL   MCH 25.6 (L) 26.0 - 34.0 pg   MCHC 32.1 30.0 - 36.0 g/dL   RDW 16.0 (H) 11.5 - 15.5 %   Platelets 117 (L) 150 - 400 K/uL    Comment: REPEATED TO VERIFY SPECIMEN CHECKED FOR CLOTS PLATELET COUNT CONFIRMED BY SMEAR   Glucose, capillary     Status: None   Collection Time: 01/24/17  8:32 AM  Result  Value Ref Range   Glucose-Capillary 99 65 - 99 mg/dL  Glucose, capillary     Status: Abnormal   Collection Time: 01/24/17 12:05 PM  Result Value Ref Range   Glucose-Capillary 199 (H) 65 - 99 mg/dL  Glucose, capillary     Status: Abnormal   Collection Time: 01/24/17  5:08 PM  Result Value Ref Range   Glucose-Capillary 220 (H) 65 - 99 mg/dL  Glucose, capillary     Status: Abnormal   Collection Time: 01/24/17  9:52 PM  Result Value Ref Range   Glucose-Capillary 241 (H)  65 - 99 mg/dL  Glucose, capillary     Status: Abnormal   Collection Time: 01/25/17  7:58 AM  Result Value Ref Range   Glucose-Capillary 250 (H) 65 - 99 mg/dL  Glucose, capillary     Status: Abnormal   Collection Time: 01/25/17 12:17 PM  Result Value Ref Range   Glucose-Capillary 256 (H) 65 - 99 mg/dL  Glucose, capillary     Status: Abnormal   Collection Time: 01/25/17  5:17 PM  Result Value Ref Range   Glucose-Capillary 205 (H) 65 - 99 mg/dL  Glucose, capillary     Status: Abnormal   Collection Time: 01/25/17  9:53 PM  Result Value Ref Range   Glucose-Capillary 287 (H) 65 - 99 mg/dL  Glucose, capillary     Status: Abnormal   Collection Time: 01/26/17  8:04 AM  Result Value Ref Range   Glucose-Capillary 167 (H) 65 - 99 mg/dL  Glucose, capillary     Status: Abnormal   Collection Time: 01/26/17 12:05 PM  Result Value Ref Range   Glucose-Capillary 254 (H) 65 - 99 mg/dL  Basic metabolic panel     Status: Abnormal   Collection Time: 01/26/17  1:31 PM  Result Value Ref Range   Sodium 139 135 - 145 mmol/L   Potassium 4.4 3.5 - 5.1 mmol/L   Chloride 102 101 - 111 mmol/L   CO2 26 22 - 32 mmol/L   Glucose, Bld 281 (H) 65 - 99 mg/dL   BUN 17 6 - 20 mg/dL   Creatinine, Ser 1.19 0.61 - 1.24 mg/dL   Calcium 9.6 8.9 - 10.3 mg/dL   GFR calc non Af Amer >60 >60 mL/min   GFR calc Af Amer >60 >60 mL/min    Comment: (NOTE) The eGFR has been calculated using the CKD EPI equation. This calculation has not been validated in all clinical situations. eGFR's persistently <60 mL/min signify possible Chronic Kidney Disease.    Anion gap 11 5 - 15  Sedimentation rate     Status: Abnormal   Collection Time: 01/26/17  1:31 PM  Result Value Ref Range   Sed Rate 40 (H) 0 - 16 mm/hr  C-reactive protein     Status: Abnormal   Collection Time: 01/26/17  1:31 PM  Result Value Ref Range   CRP 7.2 (H) <1.0 mg/dL  Prealbumin     Status: Abnormal   Collection Time: 01/26/17  1:31 PM  Result Value Ref  Range   Prealbumin 15.7 (L) 18 - 38 mg/dL  Vancomycin, trough     Status: None   Collection Time: 01/26/17  3:57 PM  Result Value Ref Range   Vancomycin Tr 19 15 - 20 ug/mL  Glucose, capillary     Status: Abnormal   Collection Time: 01/26/17  5:07 PM  Result Value Ref Range   Glucose-Capillary 211 (H) 65 - 99 mg/dL  Glucose, capillary  Status: Abnormal   Collection Time: 01/26/17  9:23 PM  Result Value Ref Range   Glucose-Capillary 247 (H) 65 - 99 mg/dL  Glucose, capillary     Status: Abnormal   Collection Time: 01/27/17  7:50 AM  Result Value Ref Range   Glucose-Capillary 138 (H) 65 - 99 mg/dL  Glucose, capillary     Status: Abnormal   Collection Time: 01/27/17 11:53 AM  Result Value Ref Range   Glucose-Capillary 183 (H) 65 - 99 mg/dL  Glucose, capillary     Status: Abnormal   Collection Time: 01/27/17  5:41 PM  Result Value Ref Range   Glucose-Capillary 222 (H) 65 - 99 mg/dL  CBC and differential     Status: Abnormal   Collection Time: 02/01/17 12:00 AM  Result Value Ref Range   Hemoglobin 12.1 (A) 13.5 - 17.5 g/dL   HCT 38 (A) 41 - 53 %   Neutrophils Absolute 6 /L   Platelets 180 150 - 399 K/L   WBC 9.1 01^6/WF  Basic metabolic panel     Status: None   Collection Time: 02/01/17 12:00 AM  Result Value Ref Range   BUN 14 4 - 21 mg/dL   Creatinine 1.1 0.6 - 1.3 mg/dL   Potassium 4.2 3.4 - 5.3 mmol/L   Sodium 140 137 - 147 mmol/L  Basic metabolic panel     Status: Abnormal   Collection Time: 02/02/17 12:46 PM  Result Value Ref Range   Sodium 140 135 - 145 mEq/L   Potassium 3.8 3.5 - 5.1 mEq/L   Chloride 106 96 - 112 mEq/L   CO2 27 19 - 32 mEq/L   Glucose, Bld 192 (H) 70 - 99 mg/dL   BUN 15 6 - 23 mg/dL   Creatinine, Ser 1.12 0.40 - 1.50 mg/dL   Calcium 8.4 8.4 - 10.5 mg/dL   GFR 72.17 >60.00 mL/min  CBC with Differential/Platelet     Status: Abnormal   Collection Time: 02/02/17 12:46 PM  Result Value Ref Range   WBC 8.3 4.0 - 10.5 K/uL   RBC 4.80 4.22 -  5.81 Mil/uL   Hemoglobin 12.5 (L) 13.0 - 17.0 g/dL   HCT 38.7 (L) 39.0 - 52.0 %   MCV 80.8 78.0 - 100.0 fl   MCHC 32.2 30.0 - 36.0 g/dL   RDW 16.1 (H) 11.5 - 15.5 %   Platelets 183.0 150.0 - 400.0 K/uL   Neutrophils Relative % 62.6 43.0 - 77.0 %   Lymphocytes Relative 25.6 12.0 - 46.0 %   Monocytes Relative 7.9 3.0 - 12.0 %   Eosinophils Relative 3.1 0.0 - 5.0 %   Basophils Relative 0.8 0.0 - 3.0 %   Neutro Abs 5.2 1.4 - 7.7 K/uL   Lymphs Abs 2.1 0.7 - 4.0 K/uL   Monocytes Absolute 0.7 0.1 - 1.0 K/uL   Eosinophils Absolute 0.3 0.0 - 0.7 K/uL   Basophils Absolute 0.1 0.0 - 0.1 K/uL  POC Glucose (CBG)     Status: Abnormal   Collection Time: 02/02/17  1:58 PM  Result Value Ref Range   POC Glucose 198 (A) 70 - 99 mg/dl  CBC and differential     Status: Abnormal   Collection Time: 02/04/17 12:00 AM  Result Value Ref Range   Hemoglobin 12.3 (A) 13.5 - 17.5 g/dL   HCT 39 (A) 41 - 53 %   Neutrophils Absolute 5 /L   Platelets 230 150 - 399 K/L   WBC 8.2 09^3/AT  Basic metabolic  panel     Status: None   Collection Time: 02/04/17 12:00 AM  Result Value Ref Range   Glucose 229 mg/dL   BUN 17 4 - 21 mg/dL   Creatinine 1.1 0.6 - 1.3 mg/dL   Potassium 4.3 3.4 - 5.3 mmol/L   Sodium 141 137 - 147 mmol/L  CBC and differential     Status: Abnormal   Collection Time: 02/08/17 12:00 AM  Result Value Ref Range   Hemoglobin 12.8 (A) 13.5 - 17.5 g/dL   HCT 38 (A) 41 - 53 %   Neutrophils Absolute 8 /L   Platelets 239 150 - 399 K/L   WBC 10.5 24^4/WN  Basic metabolic panel     Status: None   Collection Time: 02/08/17 12:00 AM  Result Value Ref Range   Glucose 226 mg/dL   BUN 18 4 - 21 mg/dL   Creatinine 0.9 0.6 - 1.3 mg/dL   Potassium 4.9 3.4 - 5.3 mmol/L   Sodium 139 137 - 147 mmol/L  CBC and differential     Status: None   Collection Time: 02/16/17 12:00 AM  Result Value Ref Range   Hemoglobin 13.5 13.5 - 17.5 g/dL   HCT 42 41 - 53 %   Neutrophils Absolute 5 /L   Platelets 186  150 - 399 K/L   WBC 7.9 02^7/OZ  Basic metabolic panel     Status: None   Collection Time: 02/16/17 12:00 AM  Result Value Ref Range   Glucose 204 mg/dL   BUN 15 4 - 21 mg/dL   Creatinine 0.8 0.6 - 1.3 mg/dL   Potassium 4.6 3.4 - 5.3 mmol/L   Sodium 142 137 - 147 mmol/L  CBC and differential     Status: Abnormal   Collection Time: 02/22/17 12:00 AM  Result Value Ref Range   Hemoglobin 13.1 (A) 13.5 - 17.5 g/dL   HCT 39 (A) 41 - 53 %   Neutrophils Absolute 4 /L   Platelets 121 (A) 150 - 399 K/L   WBC 7.2 36^6/YQ  Basic metabolic panel     Status: Abnormal   Collection Time: 02/22/17 12:00 AM  Result Value Ref Range   Glucose 183 mg/dL   BUN 12 4 - 21 mg/dL   Creatinine 0.9 0.6 - 1.3 mg/dL   Potassium 3.3 (A) 3.4 - 5.3 mmol/L   Sodium 142 137 - 147 mmol/L  POCT erythrocyte sed rate, Non-automated     Status: None   Collection Time: 02/25/17 12:00 AM  Result Value Ref Range   Sed Rate 15 mm  CBC and differential     Status: Abnormal   Collection Time: 03/01/17 12:00 AM  Result Value Ref Range   Hemoglobin 13.3 (A) 13.5 - 17.5 g/dL   HCT 41 41 - 53 %   Neutrophils Absolute 5 /L   Platelets 127 (A) 150 - 399 K/L   WBC 7.9 03^4/VQ  Basic metabolic panel     Status: None   Collection Time: 03/01/17 12:00 AM  Result Value Ref Range   Glucose 283 mg/dL   BUN 18 4 - 21 mg/dL   Creatinine 0.9 0.6 - 1.3 mg/dL   Potassium 3.5 3.4 - 5.3 mmol/L   Sodium 144 137 - 147 mmol/L    Objective: There were no vitals filed for this visit.  General: Patient is awake, alert, oriented x 3 and in no acute distress.  Dermatology: Skin is warm and dry bilateral with a healed ulcertion at right 1st  toe and medial ankle on right. There is no malodor, no active drainage, no erythema, no edema. + mild swelling. No acute signs of infection. Nails are thick and mildly elongated.    Vascular: Dorsalis Pedis pulse = 1/4 Bilateral,  Posterior Tibial pulse = 1/4 Bilateral,  Capillary Fill Time <  5 seconds  Neurologic: Protective sensation diminished bilateral.   Musculosketal: No Pain with palpation to healed ulcerated area. No pain with compression to calves bilateral. No gross bony deformities noted bilateral.   Recent Labs  06/10/16 2050 06/11/16 1231  GRAMSTAIN ABUNDANT WBC PRESENT,BOTH PMN AND MONONUCLEARABUNDANT GRAM POSITIVE COCCI IN CLUSTERS IN PAIRS NO WBC SEENABUNDANT GRAM POSITIVE COCCI IN PAIRS IN [facility] [place]    Assessment and Plan:  Problem List Items Addressed This Visit      Endocrine   Type 2 diabetes mellitus, uncontrolled ([place]) (Chronic)    Other Visit Diagnoses    Cellulitis and abscess of foot, except toes    -  Primary   Other acute osteomyelitis of right foot ([place])       Relevant Medications   Saccharomyces boulardii (PROBIOTIC) 250 MG CAPS      -Examined patient and discussed the progression of the  Healed wound with osteomyelitis and treatment alternatives. -Continue PO antibiotics and ID follow up -Rx probiotic and advised patient if diarrhea isn't improved by next week to call office for use to check for C Diff -Continue with good supportive shoes  - Advised patient to go to the ER or return to office if the wound worsens or if constitutional symptoms are present. -Patient to return to office in 3 weeks for follow up care/xrays to ensure that osteomyelitis is not advancing or changing and diabetic nail trim. Patient may benefit from diabetic shoes in future.  Landis Martins, DPM

## 2017-03-11 ENCOUNTER — Encounter: Payer: Self-pay | Admitting: Family Medicine

## 2017-03-11 ENCOUNTER — Ambulatory Visit: Payer: BLUE CROSS/BLUE SHIELD | Admitting: Adult Health

## 2017-03-16 ENCOUNTER — Encounter: Payer: Self-pay | Admitting: Family Medicine

## 2017-03-30 ENCOUNTER — Ambulatory Visit: Payer: BLUE CROSS/BLUE SHIELD | Admitting: Adult Health

## 2017-04-06 ENCOUNTER — Encounter: Payer: Self-pay | Admitting: Sports Medicine

## 2017-04-06 ENCOUNTER — Ambulatory Visit (INDEPENDENT_AMBULATORY_CARE_PROVIDER_SITE_OTHER): Payer: BLUE CROSS/BLUE SHIELD | Admitting: Sports Medicine

## 2017-04-06 DIAGNOSIS — M79676 Pain in unspecified toe(s): Secondary | ICD-10-CM

## 2017-04-06 DIAGNOSIS — B351 Tinea unguium: Secondary | ICD-10-CM

## 2017-04-06 DIAGNOSIS — E1100 Type 2 diabetes mellitus with hyperosmolarity without nonketotic hyperglycemic-hyperosmolar coma (NKHHC): Secondary | ICD-10-CM

## 2017-04-06 DIAGNOSIS — M86171 Other acute osteomyelitis, right ankle and foot: Secondary | ICD-10-CM

## 2017-04-06 NOTE — Progress Notes (Signed)
Subjective: [name] is a 56 y.o. male patient seen in office for evaluation of osteomyelitis of right 1st toe and for diabetic nail care. Patient completed  IV Vancomycin and Rocephin on 03-06-17 and oral Metronidazole on 03/13/2017 for osteomyelitis of toe which was seen on MRI, Patient is currently being followed by Dr. Drucilla Schmidt from [facility]. Denies nausea/fever/vomiting/chills/night sweats/shortness of breath/pain. Patient has no other pedal complaints at this time.  Patient Active Problem List   Diagnosis Date Noted  . Osteomyelitis of ankle or foot, right, acute ([place]) 02/18/2017  . S/P PICC central line placement 02/18/2017  . Foot ulcer due to secondary DM ([place]) 01/26/2017  . Cellulitis of right leg   . Diabetic foot ulcer ([place]) 01/23/2017  . Cardiomyopathy, ischemic 12/07/2016  . S/P CABG x 3 09/18/2016  . Encounter for therapeutic drug monitoring 09/15/2016  . Coronary artery disease 08/24/2016  . Acute on chronic systolic CHF (congestive heart failure) ([place]) 06/17/2016  . PAF (paroxysmal atrial fibrillation) ([place]) 06/17/2016  . Smoking   . Non-ST elevation myocardial infarction (NSTEMI), subsequent episode of care ([place]) 06/11/2016  . Hypoxia   . Abscess of axilla, left 06/10/2016  . Type 2 diabetes mellitus, uncontrolled ([place]) 05/23/2013  . Essential hypertension 05/23/2013  . Pure hypercholesterolemia 05/23/2013   Current Outpatient Prescriptions on File Prior to Visit  Medication Sig Dispense Refill  . acetaminophen (TYLENOL) 500 MG tablet Take 1,000 mg by mouth every 6 (six) hours as needed for headache.    Marland Kitchen aspirin 81 MG chewable tablet Chew 1 tablet (81 mg total) by mouth daily. 30 tablet 3  . atorvastatin (LIPITOR) 80 MG tablet Take 1 tablet (80 mg total) by mouth at bedtime. 90 tablet 3  . glipiZIDE (GLUCOTROL) 10 MG tablet Take 1 tablet (10 mg total) by mouth 2 (two) times daily before a meal. 180 tablet 1  . glucose blood test strip Use as instructed 100 each 3  .  hydrocerin (EUCERIN) CREA Apply 1 application topically 2 (two) times daily. Apply to both feet (including right great toe and medial malleolar wounds) twice daily. 113 g 0  . Insulin Glargine (BASAGLAR KWIKPEN) 100 UNIT/ML SOPN Inject 0.05 mLs (5 Units total) into the skin at bedtime. 3 mL 3  . Insulin Pen Needle 32G X 4 MM MISC Use to inject Insulin daily. Dx: E11.9 100 each 3  . INVOKANA 100 MG TABS tablet TAKE 1 TABLET BY MOUTH EVERY DAY BEFORE BREAKFAST 30 tablet 1  . Lancets (ONETOUCH ULTRASOFT) lancets Use as instructed 100 each 12  . metoprolol tartrate (LOPRESSOR) 25 MG tablet Take 1 tablet (25 mg total) by mouth 2 (two) times daily. 60 tablet 11  . metroNIDAZOLE (FLAGYL) 500 MG tablet Take 1 tablet (500 mg total) by mouth every 8 (eight) hours. 114 tablet 0  . pramipexole (MIRAPEX) 0.125 MG tablet Take 1 tablet (0.125 mg total) by mouth at bedtime. 90 tablet 0  . Saccharomyces boulardii (PROBIOTIC) 250 MG CAPS Take 250 mg by mouth daily. 28 capsule 0   No current facility-administered medications on file prior to visit.    Allergies  Allergen Reactions  . Latex Rash    Recent Results (from the past 2160 hour(s))  CBC with Differential/Platelet     Status: Abnormal   Collection Time: 01/18/17  9:38 AM  Result Value Ref Range   WBC 9.5 4.0 - 10.5 K/uL   RBC 5.49 4.22 - 5.81 Mil/uL   Hemoglobin 14.2 13.0 - 17.0 g/dL  HCT 44.2 39.0 - 52.0 %   MCV 80.5 78.0 - 100.0 fl   MCHC 32.3 30.0 - 36.0 g/dL   RDW 17.4 (H) 11.5 - 15.5 %   Platelets 151.0 150.0 - 400.0 K/uL   Neutrophils Relative % 58.8 43.0 - 77.0 %   Lymphocytes Relative 29.5 12.0 - 46.0 %   Monocytes Relative 8.3 3.0 - 12.0 %   Eosinophils Relative 2.9 0.0 - 5.0 %   Basophils Relative 0.5 0.0 - 3.0 %   Neutro Abs 5.6 1.4 - 7.7 K/uL   Lymphs Abs 2.8 0.7 - 4.0 K/uL   Monocytes Absolute 0.8 0.1 - 1.0 K/uL   Eosinophils Absolute 0.3 0.0 - 0.7 K/uL   Basophils Absolute 0.0 0.0 - 0.1 K/uL  TSH     Status: None    Collection Time: 01/18/17  9:38 AM  Result Value Ref Range   TSH 1.57 0.35 - 4.50 uIU/mL  Ferritin     Status: None   Collection Time: 01/18/17  9:38 AM  Result Value Ref Range   Ferritin 42.5 22.0 - 322.0 ng/mL  IBC panel     Status: Abnormal   Collection Time: 01/18/17  9:38 AM  Result Value Ref Range   Iron 48 42 - 165 ug/dL   Transferrin 296.0 212.0 - 360.0 mg/dL   Saturation Ratios 11.6 (L) 20.0 - 50.0 %  CBC with Differential     Status: Abnormal   Collection Time: 01/23/17  4:24 PM  Result Value Ref Range   WBC 14.7 (H) 4.0 - 10.5 K/uL   RBC 5.12 4.22 - 5.81 MIL/uL   Hemoglobin 13.4 13.0 - 17.0 g/dL   HCT 41.1 39.0 - 52.0 %   MCV 80.3 78.0 - 100.0 fL   MCH 26.2 26.0 - 34.0 pg   MCHC 32.6 30.0 - 36.0 g/dL   RDW 15.6 (H) 11.5 - 15.5 %   Platelets 121 (L) 150 - 400 K/uL   Neutrophils Relative % 75 %   Neutro Abs 11.1 (H) 1.7 - 7.7 K/uL   Lymphocytes Relative 13 %   Lymphs Abs 2.0 0.7 - 4.0 K/uL   Monocytes Relative 11 %   Monocytes Absolute 1.6 (H) 0.1 - 1.0 K/uL   Eosinophils Relative 1 %   Eosinophils Absolute 0.1 0.0 - 0.7 K/uL   Basophils Relative 0 %   Basophils Absolute 0.0 0.0 - 0.1 K/uL  Comprehensive metabolic panel     Status: Abnormal   Collection Time: 01/23/17  4:24 PM  Result Value Ref Range   Sodium 136 135 - 145 mmol/L   Potassium 3.6 3.5 - 5.1 mmol/L   Chloride 100 (L) 101 - 111 mmol/L   CO2 24 22 - 32 mmol/L   Glucose, Bld 428 (H) 65 - 99 mg/dL   BUN 13 6 - 20 mg/dL   Creatinine, Ser 1.30 (H) 0.61 - 1.24 mg/dL   Calcium 8.8 (L) 8.9 - 10.3 mg/dL   Total Protein 7.4 6.5 - 8.1 g/dL   Albumin 3.4 (L) 3.5 - 5.0 g/dL   AST 24 15 - 41 U/L   ALT 24 17 - 63 U/L   Alkaline Phosphatase 118 38 - 126 U/L   Total Bilirubin 0.9 0.3 - 1.2 mg/dL   GFR calc non Af Amer >60 >60 mL/min   GFR calc Af Amer >60 >60 mL/min    Comment: (NOTE) The eGFR has been calculated using the CKD EPI equation. This calculation has not been validated in all  clinical  situations. eGFR's persistently <60 mL/min signify possible Chronic Kidney Disease.    Anion gap 12 5 - 15  Blood culture (routine x 2)     Status: None   Collection Time: 01/23/17  4:29 PM  Result Value Ref Range   Specimen Description BLOOD LEFT ANTECUBITAL    Special Requests      BOTTLES DRAWN AEROBIC AND ANAEROBIC Blood Culture adequate volume   Culture NO GROWTH 5 DAYS    Report Status 01/28/2017 FINAL   I-Stat CG4 Lactic Acid, ED     Status: None   Collection Time: 01/23/17  4:36 PM  Result Value Ref Range   Lactic Acid, Venous 1.58 0.5 - 1.9 mmol/L  Blood culture (routine x 2)     Status: None   Collection Time: 01/23/17  5:15 PM  Result Value Ref Range   Specimen Description BLOOD RIGHT ANTECUBITAL    Special Requests      BOTTLES DRAWN AEROBIC AND ANAEROBIC Blood Culture adequate volume   Culture NO GROWTH 5 DAYS    Report Status 01/28/2017 FINAL   POC CBG, ED     Status: Abnormal   Collection Time: 01/23/17  7:32 PM  Result Value Ref Range   Glucose-Capillary 209 (H) 65 - 99 mg/dL  Glucose, capillary     Status: Abnormal   Collection Time: 01/23/17  8:12 PM  Result Value Ref Range   Glucose-Capillary 170 (H) 65 - 99 mg/dL  MRSA PCR Screening     Status: None   Collection Time: 01/23/17  8:23 PM  Result Value Ref Range   MRSA by PCR NEGATIVE NEGATIVE    Comment:        The GeneXpert MRSA Assay (FDA approved for NASAL specimens only), is one component of a comprehensive MRSA colonization surveillance program. It is not intended to diagnose MRSA infection nor to guide or monitor treatment for MRSA infections.   Basic metabolic panel     Status: Abnormal   Collection Time: 01/23/17  8:42 PM  Result Value Ref Range   Sodium 141 135 - 145 mmol/L   Potassium 4.0 3.5 - 5.1 mmol/L   Chloride 107 101 - 111 mmol/L   CO2 25 22 - 32 mmol/L   Glucose, Bld 160 (H) 65 - 99 mg/dL   BUN 12 6 - 20 mg/dL   Creatinine, Ser 1.13 0.61 - 1.24 mg/dL   Calcium 8.7 (L) 8.9 -  10.3 mg/dL   GFR calc non Af Amer >60 >60 mL/min   GFR calc Af Amer >60 >60 mL/min    Comment: (NOTE) The eGFR has been calculated using the CKD EPI equation. This calculation has not been validated in all clinical situations. eGFR's persistently <60 mL/min signify possible Chronic Kidney Disease.    Anion gap 9 5 - 15  CBC with Differential     Status: Abnormal   Collection Time: 01/23/17  8:42 PM  Result Value Ref Range   WBC 13.2 (H) 4.0 - 10.5 K/uL   RBC 5.02 4.22 - 5.81 MIL/uL   Hemoglobin 13.0 13.0 - 17.0 g/dL   HCT 40.1 39.0 - 52.0 %   MCV 79.9 78.0 - 100.0 fL   MCH 25.9 (L) 26.0 - 34.0 pg   MCHC 32.4 30.0 - 36.0 g/dL   RDW 15.8 (H) 11.5 - 15.5 %   Platelets 125 (L) 150 - 400 K/uL   Neutrophils Relative % 65 %   Lymphocytes Relative 23 %   Monocytes Relative  11 %   Eosinophils Relative 1 %   Basophils Relative 0 %   Neutro Abs 8.6 (H) 1.7 - 7.7 K/uL   Lymphs Abs 3.0 0.7 - 4.0 K/uL   Monocytes Absolute 1.5 (H) 0.1 - 1.0 K/uL   Eosinophils Absolute 0.1 0.0 - 0.7 K/uL   Basophils Absolute 0.0 0.0 - 0.1 K/uL   RBC Morphology POLYCHROMASIA PRESENT   Hemoglobin A1c     Status: Abnormal   Collection Time: 01/23/17  8:42 PM  Result Value Ref Range   Hgb A1c MFr Bld 9.5 (H) 4.8 - 5.6 %    Comment: (NOTE)         Pre-diabetes: 5.7 - 6.4         Diabetes: >6.4         Glycemic control for adults with diabetes: <7.0    Mean Plasma Glucose 226 mg/dL    Comment: (NOTE) Performed At: [facility] [place], [place] [id number] Lindon Romp MD [id number]   HIV antibody     Status: None   Collection Time: 01/23/17  8:42 PM  Result Value Ref Range   HIV Screen 4th Generation wRfx Non Reactive Non Reactive    Comment: (NOTE) Performed At: [facility] [place], [place] [id number] Lindon Romp MD [id number]   Sedimentation rate     Status: Abnormal   Collection Time: 01/23/17  8:42 PM  Result Value Ref Range   Sed  Rate 30 (H) 0 - 16 mm/hr  C-reactive protein     Status: Abnormal   Collection Time: 01/23/17  8:42 PM  Result Value Ref Range   CRP 8.8 (H) <1.0 mg/dL  Basic metabolic panel     Status: Abnormal   Collection Time: 01/24/17  4:46 AM  Result Value Ref Range   Sodium 140 135 - 145 mmol/L   Potassium 3.3 (L) 3.5 - 5.1 mmol/L    Comment: DELTA CHECK NOTED   Chloride 108 101 - 111 mmol/L   CO2 23 22 - 32 mmol/L   Glucose, Bld 86 65 - 99 mg/dL   BUN 15 6 - 20 mg/dL   Creatinine, Ser 1.19 0.61 - 1.24 mg/dL   Calcium 8.3 (L) 8.9 - 10.3 mg/dL   GFR calc non Af Amer >60 >60 mL/min   GFR calc Af Amer >60 >60 mL/min    Comment: (NOTE) The eGFR has been calculated using the CKD EPI equation. This calculation has not been validated in all clinical situations. eGFR's persistently <60 mL/min signify possible Chronic Kidney Disease.    Anion gap 9 5 - 15  CBC     Status: Abnormal   Collection Time: 01/24/17  4:46 AM  Result Value Ref Range   WBC 12.2 (H) 4.0 - 10.5 K/uL   RBC 5.00 4.22 - 5.81 MIL/uL   Hemoglobin 12.8 (L) 13.0 - 17.0 g/dL   HCT 39.9 39.0 - 52.0 %   MCV 79.8 78.0 - 100.0 fL   MCH 25.6 (L) 26.0 - 34.0 pg   MCHC 32.1 30.0 - 36.0 g/dL   RDW 16.0 (H) 11.5 - 15.5 %   Platelets 117 (L) 150 - 400 K/uL    Comment: REPEATED TO VERIFY SPECIMEN CHECKED FOR CLOTS PLATELET COUNT CONFIRMED BY SMEAR   Glucose, capillary     Status: None   Collection Time: 01/24/17  8:32 AM  Result Value Ref Range   Glucose-Capillary 99 65 - 99 mg/dL  Glucose, capillary  Status: Abnormal   Collection Time: 01/24/17 12:05 PM  Result Value Ref Range   Glucose-Capillary 199 (H) 65 - 99 mg/dL  Glucose, capillary     Status: Abnormal   Collection Time: 01/24/17  5:08 PM  Result Value Ref Range   Glucose-Capillary 220 (H) 65 - 99 mg/dL  Glucose, capillary     Status: Abnormal   Collection Time: 01/24/17  9:52 PM  Result Value Ref Range   Glucose-Capillary 241 (H) 65 - 99 mg/dL  Glucose, capillary      Status: Abnormal   Collection Time: 01/25/17  7:58 AM  Result Value Ref Range   Glucose-Capillary 250 (H) 65 - 99 mg/dL  Glucose, capillary     Status: Abnormal   Collection Time: 01/25/17 12:17 PM  Result Value Ref Range   Glucose-Capillary 256 (H) 65 - 99 mg/dL  Glucose, capillary     Status: Abnormal   Collection Time: 01/25/17  5:17 PM  Result Value Ref Range   Glucose-Capillary 205 (H) 65 - 99 mg/dL  Glucose, capillary     Status: Abnormal   Collection Time: 01/25/17  9:53 PM  Result Value Ref Range   Glucose-Capillary 287 (H) 65 - 99 mg/dL  Glucose, capillary     Status: Abnormal   Collection Time: 01/26/17  8:04 AM  Result Value Ref Range   Glucose-Capillary 167 (H) 65 - 99 mg/dL  Glucose, capillary     Status: Abnormal   Collection Time: 01/26/17 12:05 PM  Result Value Ref Range   Glucose-Capillary 254 (H) 65 - 99 mg/dL  Basic metabolic panel     Status: Abnormal   Collection Time: 01/26/17  1:31 PM  Result Value Ref Range   Sodium 139 135 - 145 mmol/L   Potassium 4.4 3.5 - 5.1 mmol/L   Chloride 102 101 - 111 mmol/L   CO2 26 22 - 32 mmol/L   Glucose, Bld 281 (H) 65 - 99 mg/dL   BUN 17 6 - 20 mg/dL   Creatinine, Ser 1.19 0.61 - 1.24 mg/dL   Calcium 9.6 8.9 - 10.3 mg/dL   GFR calc non Af Amer >60 >60 mL/min   GFR calc Af Amer >60 >60 mL/min    Comment: (NOTE) The eGFR has been calculated using the CKD EPI equation. This calculation has not been validated in all clinical situations. eGFR's persistently <60 mL/min signify possible Chronic Kidney Disease.    Anion gap 11 5 - 15  Sedimentation rate     Status: Abnormal   Collection Time: 01/26/17  1:31 PM  Result Value Ref Range   Sed Rate 40 (H) 0 - 16 mm/hr  C-reactive protein     Status: Abnormal   Collection Time: 01/26/17  1:31 PM  Result Value Ref Range   CRP 7.2 (H) <1.0 mg/dL  Prealbumin     Status: Abnormal   Collection Time: 01/26/17  1:31 PM  Result Value Ref Range   Prealbumin 15.7 (L) 18 - 38  mg/dL  Vancomycin, trough     Status: None   Collection Time: 01/26/17  3:57 PM  Result Value Ref Range   Vancomycin Tr 19 15 - 20 ug/mL  Glucose, capillary     Status: Abnormal   Collection Time: 01/26/17  5:07 PM  Result Value Ref Range   Glucose-Capillary 211 (H) 65 - 99 mg/dL  Glucose, capillary     Status: Abnormal   Collection Time: 01/26/17  9:23 PM  Result Value Ref Range  Glucose-Capillary 247 (H) 65 - 99 mg/dL  Glucose, capillary     Status: Abnormal   Collection Time: 01/27/17  7:50 AM  Result Value Ref Range   Glucose-Capillary 138 (H) 65 - 99 mg/dL  Glucose, capillary     Status: Abnormal   Collection Time: 01/27/17 11:53 AM  Result Value Ref Range   Glucose-Capillary 183 (H) 65 - 99 mg/dL  Glucose, capillary     Status: Abnormal   Collection Time: 01/27/17  5:41 PM  Result Value Ref Range   Glucose-Capillary 222 (H) 65 - 99 mg/dL  CBC and differential     Status: Abnormal   Collection Time: 02/01/17 12:00 AM  Result Value Ref Range   Hemoglobin 12.1 (A) 13.5 - 17.5 g/dL   HCT 38 (A) 41 - 53 %   Neutrophils Absolute 6 /L   Platelets 180 150 - 399 K/L   WBC 9.1 16^9/CV  Basic metabolic panel     Status: None   Collection Time: 02/01/17 12:00 AM  Result Value Ref Range   BUN 14 4 - 21 mg/dL   Creatinine 1.1 0.6 - 1.3 mg/dL   Potassium 4.2 3.4 - 5.3 mmol/L   Sodium 140 137 - 147 mmol/L  Basic metabolic panel     Status: Abnormal   Collection Time: 02/02/17 12:46 PM  Result Value Ref Range   Sodium 140 135 - 145 mEq/L   Potassium 3.8 3.5 - 5.1 mEq/L   Chloride 106 96 - 112 mEq/L   CO2 27 19 - 32 mEq/L   Glucose, Bld 192 (H) 70 - 99 mg/dL   BUN 15 6 - 23 mg/dL   Creatinine, Ser 1.12 0.40 - 1.50 mg/dL   Calcium 8.4 8.4 - 10.5 mg/dL   GFR 72.17 >60.00 mL/min  CBC with Differential/Platelet     Status: Abnormal   Collection Time: 02/02/17 12:46 PM  Result Value Ref Range   WBC 8.3 4.0 - 10.5 K/uL   RBC 4.80 4.22 - 5.81 Mil/uL   Hemoglobin 12.5 (L) 13.0  - 17.0 g/dL   HCT 38.7 (L) 39.0 - 52.0 %   MCV 80.8 78.0 - 100.0 fl   MCHC 32.2 30.0 - 36.0 g/dL   RDW 16.1 (H) 11.5 - 15.5 %   Platelets 183.0 150.0 - 400.0 K/uL   Neutrophils Relative % 62.6 43.0 - 77.0 %   Lymphocytes Relative 25.6 12.0 - 46.0 %   Monocytes Relative 7.9 3.0 - 12.0 %   Eosinophils Relative 3.1 0.0 - 5.0 %   Basophils Relative 0.8 0.0 - 3.0 %   Neutro Abs 5.2 1.4 - 7.7 K/uL   Lymphs Abs 2.1 0.7 - 4.0 K/uL   Monocytes Absolute 0.7 0.1 - 1.0 K/uL   Eosinophils Absolute 0.3 0.0 - 0.7 K/uL   Basophils Absolute 0.1 0.0 - 0.1 K/uL  POC Glucose (CBG)     Status: Abnormal   Collection Time: 02/02/17  1:58 PM  Result Value Ref Range   POC Glucose 198 (A) 70 - 99 mg/dl  CBC and differential     Status: Abnormal   Collection Time: 02/04/17 12:00 AM  Result Value Ref Range   Hemoglobin 12.3 (A) 13.5 - 17.5 g/dL   HCT 39 (A) 41 - 53 %   Neutrophils Absolute 5 /L   Platelets 230 150 - 399 K/L   WBC 8.2 89^3/YB  Basic metabolic panel     Status: None   Collection Time: 02/04/17 12:00 AM  Result Value  Ref Range   Glucose 229 mg/dL   BUN 17 4 - 21 mg/dL   Creatinine 1.1 0.6 - 1.3 mg/dL   Potassium 4.3 3.4 - 5.3 mmol/L   Sodium 141 137 - 147 mmol/L  CBC and differential     Status: Abnormal   Collection Time: 02/08/17 12:00 AM  Result Value Ref Range   Hemoglobin 12.8 (A) 13.5 - 17.5 g/dL   HCT 38 (A) 41 - 53 %   Neutrophils Absolute 8 /L   Platelets 239 150 - 399 K/L   WBC 10.5 26^9/SW  Basic metabolic panel     Status: None   Collection Time: 02/08/17 12:00 AM  Result Value Ref Range   Glucose 226 mg/dL   BUN 18 4 - 21 mg/dL   Creatinine 0.9 0.6 - 1.3 mg/dL   Potassium 4.9 3.4 - 5.3 mmol/L   Sodium 139 137 - 147 mmol/L  CBC and differential     Status: None   Collection Time: 02/16/17 12:00 AM  Result Value Ref Range   Hemoglobin 13.5 13.5 - 17.5 g/dL   HCT 42 41 - 53 %   Neutrophils Absolute 5 /L   Platelets 186 150 - 399 K/L   WBC 7.9 54^6/EV   Basic metabolic panel     Status: None   Collection Time: 02/16/17 12:00 AM  Result Value Ref Range   Glucose 204 mg/dL   BUN 15 4 - 21 mg/dL   Creatinine 0.8 0.6 - 1.3 mg/dL   Potassium 4.6 3.4 - 5.3 mmol/L   Sodium 142 137 - 147 mmol/L  CBC and differential     Status: Abnormal   Collection Time: 02/22/17 12:00 AM  Result Value Ref Range   Hemoglobin 13.1 (A) 13.5 - 17.5 g/dL   HCT 39 (A) 41 - 53 %   Neutrophils Absolute 4 /L   Platelets 121 (A) 150 - 399 K/L   WBC 7.2 03^5/KK  Basic metabolic panel     Status: Abnormal   Collection Time: 02/22/17 12:00 AM  Result Value Ref Range   Glucose 183 mg/dL   BUN 12 4 - 21 mg/dL   Creatinine 0.9 0.6 - 1.3 mg/dL   Potassium 3.3 (A) 3.4 - 5.3 mmol/L   Sodium 142 137 - 147 mmol/L  POCT erythrocyte sed rate, Non-automated     Status: None   Collection Time: 02/25/17 12:00 AM  Result Value Ref Range   Sed Rate 15 mm  CBC and differential     Status: Abnormal   Collection Time: 03/01/17 12:00 AM  Result Value Ref Range   Hemoglobin 13.3 (A) 13.5 - 17.5 g/dL   HCT 41 41 - 53 %   Neutrophils Absolute 5 /L   Platelets 127 (A) 150 - 399 K/L   WBC 7.9 93^8/HW  Basic metabolic panel     Status: None   Collection Time: 03/01/17 12:00 AM  Result Value Ref Range   Glucose 283 mg/dL   BUN 18 4 - 21 mg/dL   Creatinine 0.9 0.6 - 1.3 mg/dL   Potassium 3.5 3.4 - 5.3 mmol/L   Sodium 144 137 - 147 mmol/L  POCT erythrocyte sed rate, Non-automated     Status: None   Collection Time: 03/05/17 12:00 AM  Result Value Ref Range   Sed Rate 12 mm  CBC and differential     Status: Abnormal   Collection Time: 03/08/17 12:00 AM  Result Value Ref Range   Hemoglobin 13.1 (A) 13.5 -  17.5 g/dL   HCT 42 41 - 53 %   Neutrophils Absolute 5 /L   Platelets 133 (A) 150 - 399 K/L   WBC 7.9 84^6/NG  Basic metabolic panel     Status: None   Collection Time: 03/08/17 12:00 AM  Result Value Ref Range   Glucose 326 mg/dL   BUN 14 4 - 21 mg/dL    Creatinine 0.9 0.6 - 1.3 mg/dL   Potassium 3.6 3.4 - 5.3 mmol/L   Sodium 146 137 - 147 mmol/L    Objective: There were no vitals filed for this visit.  General: Patient is awake, alert, oriented x 3 and in no acute distress.  Dermatology: Skin is warm and dry bilateral with aContinued healed ulcertion at right 1st toe and medial ankle on right. There is no malodor, no active drainage, no erythema, no edema. + mild swelling. No acute signs of infection. Nails are thick and mildly elongated.    Vascular: Dorsalis Pedis pulse = 1/4 Bilateral,  Posterior Tibial pulse = 1/4 Bilateral,  Capillary Fill Time < 5 seconds  Neurologic: Protective sensation diminished bilateral.   Musculosketal: No Pain with palpation to healed ulcerated area. No pain with compression to calves bilateral. No gross bony deformities noted bilateral.   Recent Labs  06/10/16 2050 06/11/16 1231  GRAMSTAIN ABUNDANT WBC PRESENT,BOTH PMN AND MONONUCLEARABUNDANT GRAM POSITIVE COCCI IN CLUSTERS IN PAIRS NO WBC SEENABUNDANT GRAM POSITIVE COCCI IN PAIRS IN [facility] [place]    Assessment and Plan:  Problem List Items Addressed This Visit    None    Visit Diagnoses    Pain due to onychomycosis of toenail    -  Primary   Other acute osteomyelitis of right foot ([place])       Uncontrolled type 2 diabetes mellitus with hyperosmolarity without coma, without long-term current use of insulin ([place])          -Examined patient and discussed the progression of the  Healed wound with osteomyelitis and treatment alternatives. -Recommend surveillance x-ray at next appointment and close monitoring. If clinically continues to improve, then no repeat MRI as needed. However, if worsens, then we will repeat MRI. -Mechanically debrided nails 10 using a sterile nail nipper without incident -Continue with good supportive shoes  - Advised patient to go to the  ER or return to office if the wound worsens or if constitutional symptoms are present. -Patient to return to office in 8 weeks for follow up care/xrays to ensure that osteomyelitis is not advancing or changing and diabetic foot care. Patient may benefit from diabetic shoes in future.  Landis Martins, DPM

## 2017-04-09 ENCOUNTER — Other Ambulatory Visit: Payer: Self-pay | Admitting: Adult Health

## 2017-04-09 ENCOUNTER — Telehealth: Payer: Self-pay | Admitting: Adult Health

## 2017-04-09 DIAGNOSIS — IMO0001 Reserved for inherently not codable concepts without codable children: Secondary | ICD-10-CM

## 2017-04-09 DIAGNOSIS — E1165 Type 2 diabetes mellitus with hyperglycemia: Principal | ICD-10-CM

## 2017-04-09 NOTE — Telephone Encounter (Signed)
° ° °  Pt request refill of the following:  INVOKANA 100 MG TABS tablet  Pt need 90 day supply     Phamacy: [facility] [address]

## 2017-04-27 ENCOUNTER — Ambulatory Visit (INDEPENDENT_AMBULATORY_CARE_PROVIDER_SITE_OTHER): Payer: BLUE CROSS/BLUE SHIELD | Admitting: Adult Health

## 2017-04-27 ENCOUNTER — Encounter: Payer: Self-pay | Admitting: Adult Health

## 2017-04-27 VITALS — BP 102/72 | HR 93 | Temp 98.0°F | Ht 65.0 in | Wt 172.0 lb

## 2017-04-27 DIAGNOSIS — Z794 Long term (current) use of insulin: Secondary | ICD-10-CM | POA: Diagnosis not present

## 2017-04-27 DIAGNOSIS — IMO0002 Reserved for concepts with insufficient information to code with codable children: Secondary | ICD-10-CM

## 2017-04-27 DIAGNOSIS — L97509 Non-pressure chronic ulcer of other part of unspecified foot with unspecified severity: Secondary | ICD-10-CM

## 2017-04-27 DIAGNOSIS — E1165 Type 2 diabetes mellitus with hyperglycemia: Secondary | ICD-10-CM

## 2017-04-27 DIAGNOSIS — F339 Major depressive disorder, recurrent, unspecified: Secondary | ICD-10-CM | POA: Diagnosis not present

## 2017-04-27 DIAGNOSIS — E11621 Type 2 diabetes mellitus with foot ulcer: Secondary | ICD-10-CM | POA: Diagnosis not present

## 2017-04-27 LAB — POCT GLYCOSYLATED HEMOGLOBIN (HGB A1C): Hemoglobin A1C: 9.7

## 2017-04-27 MED ORDER — CITALOPRAM HYDROBROMIDE 10 MG PO TABS: 10.0000 mg | ORAL_TABLET | Freq: Every day | ORAL | 3 refills | Status: DC

## 2017-04-27 MED ORDER — BASAGLAR KWIKPEN 100 UNIT/ML ~~LOC~~ SOPN: 5.0000 [IU] | PEN_INJECTOR | Freq: Every day | SUBCUTANEOUS | 3 refills | Status: DC

## 2017-04-27 NOTE — Progress Notes (Signed)
Subjective:    Patient ID: [name], male    DOB: [birth year], 56 y.o.   MRN: [id number]  HPI  56 year old male who  has a past medical history of Abrasion (L FOREARM); Atrial fibrillation ([place]); CAD (coronary artery disease); Cancer ([place]); Diabetes mellitus without complication ([place]); Headache(784.0); Hematuria, microscopic ("SINCE I WAS A KID"); Hyperlipidemia; Hypertension; Ischemic cardiomyopathy; Lactose intolerance; Myocardial infarction ([place]); Respiratory failure ([place]); Rotator cuff tear (RIGHT); and Tobacco use. He presents to the office today for follow up regarding diabetes.   His last A1c was 9.5   He is currently maintained on Invokana 300 mg, glipizide 10 mg, and Basaglar 5 units daily. He reports that his diet was unsatifactory when he came in the last time. Since I have last seen him he went to his eye doctor and was diagnosed with some diabetic retinopathy, glaucoma, and cataracts. He has been working on his diet and reports that over the last month his blood sugars have been well controlled ( in the 90's.) He does report episodes of lows and has become shaky and diaphoretic.   Today in the office he continues to complain of depression and not wanting to be around people. He contributes this to his multiple medical issues he has had over the last year. He denies any SI.   We have tried him on Wellbutrin in the past and he did not respond well to it. He would like to try celexa.    Review of Systems See HPI   Past Medical History:  Diagnosis Date  . Abrasion L FOREARM  . Atrial fibrillation ([place])    a. initially occurring in post-op setting in 05/2016, started on Eliquis b. 08/2016: s/p clipping of atrial appendage at time of CABG --> Eliquis switched to Coumadin  . CAD (coronary artery disease)    a. 05/2016: NSTEMI, cath showing 3v disease with CABG recommended once MRSA infection resolved. b. 08/2016: CABG with LIMA-LAD, SVG-D1, and SVG-RI.  Marland Kitchen Cancer ([place])    basal cell carcinoma of the skin  . Diabetes mellitus without complication ([place])   . Headache(784.0)   . Hematuria, microscopic "SINCE I WAS A KID"  . Hyperlipidemia   . Hypertension   . Ischemic cardiomyopathy    a. 05/2016: echo w/ EF of 25-35% and akinesis of the basal-midinferolateral and inferior myocardium.  . Lactose intolerance   . Myocardial infarction ([place])   . Respiratory failure ([place])   . Rotator cuff tear RIGHT  . Tobacco use    a. quit in 05/2016 following MI    Social History   Social History  . Marital status: Married    Spouse name: N/A  . Number of children: N/A  . Years of education: N/A   Occupational History  . Magazine features editor   Social History Main Topics  . Smoking status: Current Every Day Smoker    Packs/day: 0.50    Types: Cigarettes  . Smokeless tobacco: Never Used  . Alcohol use No  . Drug use: No  . Sexual activity: Not on file   Other Topics Concern  . Not on file   Social History Narrative  . No narrative on file    Past Surgical History:  Procedure Laterality Date  . CARDIAC CATHETERIZATION N/A 06/16/2016   Procedure: Left Heart Cath and Coronary Angiography;  Surgeon: Troy Sine, MD;  Location: [facility];  Service: Cardiovascular;  Laterality: N/A;  . CLIPPING OF  ATRIAL APPENDAGE N/A 08/24/2016   Procedure: CLIPPING OF ATRIAL APPENDAGE;  Surgeon: Ivin Poot, MD;  Location: [facility];  Service: Open Heart Surgery;  Laterality: N/A;  . CORONARY ARTERY BYPASS GRAFT N/A 08/24/2016   Procedure: CORONARY ARTERY BYPASS GRAFTING (CABG) x three,  using left internal mammary artery and right leg greater saphenous vein harvested endoscopically;  Surgeon: Ivin Poot, MD;  Location: [facility];  Service: Open Heart Surgery;  Laterality: N/A;  . HERNIA REPAIR  X2  . INCISION AND DRAINAGE ABSCESS Left 06/10/2016   Procedure: INCISION AND DRAINAGE LEFT AXILLARY ABSCESS;  Surgeon: Autumn Messing III, MD;  Location: [facility];   Service: General;  Laterality: Left;  . ROTATOR CUFF REPAIR Bilateral   . TEE WITHOUT CARDIOVERSION N/A 08/24/2016   Procedure: TRANSESOPHAGEAL ECHOCARDIOGRAM (TEE);  Surgeon: Ivin Poot, MD;  Location: [facility];  Service: Open Heart Surgery;  Laterality: N/A;    Family History  Problem Relation Age of Onset  . Hypertension Father   . Sudden death Father   . Diabetes Father   . Heart attack Father   . Hypertension Mother   . Heart attack Sister     Allergies  Allergen Reactions  . Latex Rash    Current Outpatient Prescriptions on File Prior to Visit  Medication Sig Dispense Refill  . acetaminophen (TYLENOL) 500 MG tablet Take 1,000 mg by mouth every 6 (six) hours as needed for headache.    Marland Kitchen aspirin 81 MG chewable tablet Chew 1 tablet (81 mg total) by mouth daily. 30 tablet 3  . atorvastatin (LIPITOR) 80 MG tablet Take 1 tablet (80 mg total) by mouth at bedtime. 90 tablet 3  . glipiZIDE (GLUCOTROL) 10 MG tablet Take 1 tablet (10 mg total) by mouth 2 (two) times daily before a meal. 180 tablet 1  . glucose blood test strip Use as instructed 100 each 3  . hydrocerin (EUCERIN) CREA Apply 1 application topically 2 (two) times daily. Apply to both feet (including right great toe and medial malleolar wounds) twice daily. 113 g 0  . Insulin Glargine (BASAGLAR KWIKPEN) 100 UNIT/ML SOPN Inject 0.05 mLs (5 Units total) into the skin at bedtime. 3 mL 3  . Insulin Pen Needle 32G X 4 MM MISC Use to inject Insulin daily. Dx: E11.9 100 each 3  . INVOKANA 100 MG TABS tablet TAKE 1 TABLET BY MOUTH EVERY DAY BEFORE BREAKFAST 90 tablet 0  . Lancets (ONETOUCH ULTRASOFT) lancets Use as instructed 100 each 12  . metoprolol tartrate (LOPRESSOR) 25 MG tablet Take 1 tablet (25 mg total) by mouth 2 (two) times daily. 60 tablet 11  . pramipexole (MIRAPEX) 0.125 MG tablet Take 1 tablet (0.125 mg total) by mouth at bedtime. 90 tablet 0  . Saccharomyces boulardii (PROBIOTIC) 250 MG CAPS Take 250 mg by mouth  daily. 28 capsule 0   No current facility-administered medications on file prior to visit.     BP 102/72   Pulse 93   Temp 98 F (36.7 C) (Oral)   Ht 5\' 5"  (1.651 m)   Wt 172 lb (78 kg)   BMI 28.62 kg/m       Objective:   Physical Exam  Constitutional: He is oriented to person, place, and time. He appears well-developed and well-nourished. No distress.  Cardiovascular: Normal rate, regular rhythm, normal heart sounds and intact distal pulses.  Exam reveals no gallop and no friction rub.   No murmur heard. Pulmonary/Chest: Effort normal and breath sounds  normal. No respiratory distress. He has no wheezes. He has no rales. He exhibits no tenderness.  Neurological: He is alert and oriented to person, place, and time.  Skin: Skin is warm and dry. No rash noted. He is not diaphoretic. No erythema. No pallor.  Psychiatric: He has a normal mood and affect. His behavior is normal. Judgment and thought content normal.  Nursing note and vitals reviewed.      Assessment & Plan:  1. Uncontrolled type 2 diabetes mellitus with foot ulcer, with long-term current use of insulin (HCC) - POCT glycosylated hemoglobin (Hb A1C) - 9.8.  - Insulin Glargine (BASAGLAR KWIKPEN) 100 UNIT/ML SOPN; Inject 0.05 mLs (5 Units total) into the skin at bedtime.  Dispense: 3 mL; Refill: 3 - Follow up in 3 months   2. Depression, recurrent ([place]) - citalopram (CELEXA) 10 MG tablet; Take 1 tablet (10 mg total) by mouth daily.  Dispense: 30 tablet; Refill: 3 - Follow up in one month or sooner if needed - Advised that if he develops any SI then he needs to stop medication and go to the ER   [name], NP

## 2017-05-04 ENCOUNTER — Ambulatory Visit: Payer: BLUE CROSS/BLUE SHIELD | Admitting: Neurology

## 2017-05-25 ENCOUNTER — Ambulatory Visit: Payer: BLUE CROSS/BLUE SHIELD | Admitting: Sports Medicine

## 2017-06-08 ENCOUNTER — Other Ambulatory Visit: Payer: Self-pay | Admitting: Neurology

## 2017-06-22 ENCOUNTER — Encounter: Payer: Self-pay | Admitting: Sports Medicine

## 2017-06-22 ENCOUNTER — Ambulatory Visit (INDEPENDENT_AMBULATORY_CARE_PROVIDER_SITE_OTHER): Payer: BLUE CROSS/BLUE SHIELD | Admitting: Sports Medicine

## 2017-06-22 ENCOUNTER — Ambulatory Visit (INDEPENDENT_AMBULATORY_CARE_PROVIDER_SITE_OTHER): Payer: BLUE CROSS/BLUE SHIELD

## 2017-06-22 DIAGNOSIS — M79676 Pain in unspecified toe(s): Secondary | ICD-10-CM | POA: Diagnosis not present

## 2017-06-22 DIAGNOSIS — B351 Tinea unguium: Secondary | ICD-10-CM | POA: Diagnosis not present

## 2017-06-22 DIAGNOSIS — M86171 Other acute osteomyelitis, right ankle and foot: Secondary | ICD-10-CM | POA: Diagnosis not present

## 2017-06-22 DIAGNOSIS — E1100 Type 2 diabetes mellitus with hyperosmolarity without nonketotic hyperglycemic-hyperosmolar coma (NKHHC): Secondary | ICD-10-CM | POA: Diagnosis not present

## 2017-06-22 NOTE — Progress Notes (Signed)
Subjective: [name] is a 56 y.o. male patient seen in office for follow up evaluation of osteomyelitis of right 1st toe and for diabetic nail care. Patient has no symptoms at Right 1st toe and has been released from ID. Patient also has area of blister where he wore work shoes a few weeks ago that rubbed. Patient has no other pedal complaints at this time.  Patient Active Problem List   Diagnosis Date Noted  . Osteomyelitis of ankle or foot, right, acute ([place]) 02/18/2017  . S/P PICC central line placement 02/18/2017  . Foot ulcer due to secondary DM ([place]) 01/26/2017  . Cellulitis of right leg   . Diabetic foot ulcer ([place]) 01/23/2017  . Cardiomyopathy, ischemic 12/07/2016  . S/P CABG x 3 09/18/2016  . Encounter for therapeutic drug monitoring 09/15/2016  . Coronary artery disease 08/24/2016  . Acute on chronic systolic CHF (congestive heart failure) ([place]) 06/17/2016  . PAF (paroxysmal atrial fibrillation) ([place]) 06/17/2016  . Smoking   . Non-ST elevation myocardial infarction (NSTEMI), subsequent episode of care ([place]) 06/11/2016  . Hypoxia   . Abscess of axilla, left 06/10/2016  . Type 2 diabetes mellitus, uncontrolled ([place]) 05/23/2013  . Essential hypertension 05/23/2013  . Pure hypercholesterolemia 05/23/2013   Current Outpatient Prescriptions on File Prior to Visit  Medication Sig Dispense Refill  . acetaminophen (TYLENOL) 500 MG tablet Take 1,000 mg by mouth every 6 (six) hours as needed for headache.    Marland Kitchen aspirin 81 MG chewable tablet Chew 1 tablet (81 mg total) by mouth daily. 30 tablet 3  . atorvastatin (LIPITOR) 80 MG tablet Take 1 tablet (80 mg total) by mouth at bedtime. 90 tablet 3  . citalopram (CELEXA) 10 MG tablet Take 1 tablet (10 mg total) by mouth daily. 30 tablet 3  . glipiZIDE (GLUCOTROL) 10 MG tablet Take 1 tablet (10 mg total) by mouth 2 (two) times daily before a meal. 180 tablet 1  . glucose blood test strip Use as instructed 100 each 3  . hydrocerin  (EUCERIN) CREA Apply 1 application topically 2 (two) times daily. Apply to both feet (including right great toe and medial malleolar wounds) twice daily. 113 g 0  . Insulin Glargine (BASAGLAR KWIKPEN) 100 UNIT/ML SOPN Inject 0.05 mLs (5 Units total) into the skin at bedtime. 3 mL 3  . Insulin Pen Needle 32G X 4 MM MISC Use to inject Insulin daily. Dx: E11.9 100 each 3  . INVOKANA 100 MG TABS tablet TAKE 1 TABLET BY MOUTH EVERY DAY BEFORE BREAKFAST 90 tablet 0  . Lancets (ONETOUCH ULTRASOFT) lancets Use as instructed 100 each 12  . metoprolol tartrate (LOPRESSOR) 25 MG tablet Take 1 tablet (25 mg total) by mouth 2 (two) times daily. 60 tablet 11  . pramipexole (MIRAPEX) 0.125 MG tablet Take 1 tablet (0.125 mg total) by mouth at bedtime. 90 tablet 0  . Saccharomyces boulardii (PROBIOTIC) 250 MG CAPS Take 250 mg by mouth daily. 28 capsule 0   No current facility-administered medications on file prior to visit.    Allergies  Allergen Reactions  . Latex Rash    Recent Results (from the past 2160 hour(s))  POCT glycosylated hemoglobin (Hb A1C)     Status: None   Collection Time: 04/27/17 10:47 AM  Result Value Ref Range   Hemoglobin A1C 9.7     Objective: There were no vitals filed for this visit.  General: Patient is awake, alert, oriented x 3 and in no acute distress.  Dermatology: Skin is warm and dry bilateral with a healed ulcertion at right 1st toe and medial ankle on right. There is no malodor, no active drainage, no erythema, no edema. + mild swelling. Callus sub met 5 on left and pre-ulcerative callus with dry blood bilateral 1st toes and right sub met 5 with no opening. No acute signs of infection. Nails are thick and mildly elongated 1-5 bilateral.    Vascular: Dorsalis Pedis pulse = 1/4 Bilateral,  Posterior Tibial pulse = 1/4 Bilateral,  Capillary Fill Time < 5 seconds  Neurologic: Protective sensation diminished bilateral.   Musculosketal: No Pain with palpation to healed  ulcerated area. No pain with compression to calves bilateral. Mild hammertoe bony deformities noted bilateral.  No results for input(s): GRAMSTAIN, LABORGA in the last 8760 hours.  Assessment and Plan:  Problem List Items Addressed This Visit    None    Visit Diagnoses    Pain due to onychomycosis of toenail    -  Primary   Other acute osteomyelitis of right foot ([place])       Relevant Orders   DG Foot Complete Right (Completed)   Uncontrolled type 2 diabetes mellitus with hyperosmolarity without coma, without long-term current use of insulin ([place])          -Examined patient and discussed the progression of the Healed wound with osteomyelitis and treatment alternatives. -Xrays right foot, no acute findings at right 1st toe. Osteomyelitis is resolved after completing IV antibiotics.  -Mechanically debrided nails 10 using a sterile nail nipper without incident -[employer] step diabetic shoe order form was completed; office to contact primary care for approval / certification;  Office to arrange shoe fitting and dispensing. Patient was molded today and picked out shoe style. - Advised patient to go to the ER or return to office if feet worsen  -Patient to return to office in 3 months for nail care and pick up diabetic shoes when ready.  Landis Martins, DPM

## 2017-06-29 ENCOUNTER — Other Ambulatory Visit: Payer: Self-pay | Admitting: Neurology

## 2017-06-29 ENCOUNTER — Other Ambulatory Visit: Payer: Self-pay | Admitting: Adult Health

## 2017-06-30 ENCOUNTER — Other Ambulatory Visit: Payer: Self-pay | Admitting: Pediatrics

## 2017-06-30 DIAGNOSIS — F339 Major depressive disorder, recurrent, unspecified: Secondary | ICD-10-CM

## 2017-06-30 MED ORDER — CITALOPRAM HYDROBROMIDE 10 MG PO TABS: 10.0000 mg | ORAL_TABLET | Freq: Every day | ORAL | 1 refills | Status: DC

## 2017-06-30 NOTE — Telephone Encounter (Signed)
Ok to refill for 90 days  

## 2017-06-30 NOTE — Telephone Encounter (Signed)
Last OV 04/27/17-per note pt to f/u in 1 month, No f/u appt-no appt sch. OK to refill

## 2017-06-30 NOTE — Telephone Encounter (Signed)
Last OV 04/27/17, no mention of con't glipizide.  Recommendation for f/u 3 mos from 07/18 OV, not sch. Please advise.

## 2017-06-30 NOTE — Telephone Encounter (Signed)
Ok to refill 90 days plus one refill

## 2017-08-09 ENCOUNTER — Other Ambulatory Visit: Payer: Self-pay | Admitting: Adult Health

## 2017-08-09 ENCOUNTER — Other Ambulatory Visit: Payer: Self-pay | Admitting: Student

## 2017-08-09 DIAGNOSIS — IMO0001 Reserved for inherently not codable concepts without codable children: Secondary | ICD-10-CM

## 2017-08-09 DIAGNOSIS — E1165 Type 2 diabetes mellitus with hyperglycemia: Principal | ICD-10-CM

## 2017-08-09 NOTE — Telephone Encounter (Signed)
REFILL 

## 2017-08-10 NOTE — Telephone Encounter (Signed)
Left a message for a return call.  Pt needs A1C check.  Needs to be scheduled.

## 2017-08-12 NOTE — Telephone Encounter (Signed)
Pt must check his work schedule and callback to schedule

## 2017-09-03 ENCOUNTER — Ambulatory Visit: Payer: BLUE CROSS/BLUE SHIELD | Admitting: Neurology

## 2017-09-21 ENCOUNTER — Encounter: Payer: Self-pay | Admitting: Sports Medicine

## 2017-09-21 ENCOUNTER — Ambulatory Visit: Payer: BLUE CROSS/BLUE SHIELD | Admitting: Sports Medicine

## 2017-09-21 DIAGNOSIS — E1100 Type 2 diabetes mellitus with hyperosmolarity without nonketotic hyperglycemic-hyperosmolar coma (NKHHC): Secondary | ICD-10-CM | POA: Diagnosis not present

## 2017-09-21 DIAGNOSIS — M79676 Pain in unspecified toe(s): Secondary | ICD-10-CM | POA: Diagnosis not present

## 2017-09-21 DIAGNOSIS — M2041 Other hammer toe(s) (acquired), right foot: Secondary | ICD-10-CM

## 2017-09-21 DIAGNOSIS — E1140 Type 2 diabetes mellitus with diabetic neuropathy, unspecified: Secondary | ICD-10-CM | POA: Diagnosis not present

## 2017-09-21 DIAGNOSIS — E1142 Type 2 diabetes mellitus with diabetic polyneuropathy: Secondary | ICD-10-CM

## 2017-09-21 DIAGNOSIS — B351 Tinea unguium: Secondary | ICD-10-CM

## 2017-09-21 DIAGNOSIS — M2042 Other hammer toe(s) (acquired), left foot: Secondary | ICD-10-CM

## 2017-09-21 DIAGNOSIS — L84 Corns and callosities: Secondary | ICD-10-CM | POA: Diagnosis not present

## 2017-09-21 NOTE — Progress Notes (Signed)
Subjective: [name] is a 56 y.o. male patient seen in office for follow up evaluation of osteomyelitis of right 1st toe and for diabetic nail care and pick up diabetic shoes. Patient has no symptoms at Right 1st toe and no other issues noted. Patient has no other pedal complaints at this time.  Patient Active Problem List   Diagnosis Date Noted  . Osteomyelitis of ankle or foot, right, acute ([place]) 02/18/2017  . S/P PICC central line placement 02/18/2017  . Foot ulcer due to secondary DM ([place]) 01/26/2017  . Cellulitis of right leg   . Diabetic foot ulcer ([place]) 01/23/2017  . Cardiomyopathy, ischemic 12/07/2016  . S/P CABG x 3 09/18/2016  . Coronary artery disease 08/24/2016  . Acute on chronic systolic CHF (congestive heart failure) ([place]) 06/17/2016  . PAF (paroxysmal atrial fibrillation) ([place]) 06/17/2016  . Smoking   . Non-ST elevation myocardial infarction (NSTEMI), subsequent episode of care ([place]) 06/11/2016  . Hypoxia   . Abscess of axilla, left 06/10/2016  . Type 2 diabetes mellitus, uncontrolled ([place]) 05/23/2013  . Essential hypertension 05/23/2013  . Pure hypercholesterolemia 05/23/2013   Current Outpatient Medications on File Prior to Visit  Medication Sig Dispense Refill  . acetaminophen (TYLENOL) 500 MG tablet Take 1,000 mg by mouth every 6 (six) hours as needed for headache.    Marland Kitchen aspirin 81 MG chewable tablet Chew 1 tablet (81 mg total) by mouth daily. 30 tablet 3  . atorvastatin (LIPITOR) 80 MG tablet Take 1 tablet (80 mg total) by mouth at bedtime. 90 tablet 3  . citalopram (CELEXA) 10 MG tablet Take 1 tablet (10 mg total) by mouth daily. 90 tablet 1  . glipiZIDE (GLUCOTROL) 10 MG tablet TAKE 1 TABLET (10 MG TOTAL) BY MOUTH 2 (TWO) TIMES DAILY BEFORE A MEAL. 180 tablet 0  . glucose blood test strip Use as instructed 100 each 3  . hydrocerin (EUCERIN) CREA Apply 1 application topically 2 (two) times daily. Apply to both feet (including right great toe and medial  malleolar wounds) twice daily. 113 g 0  . Insulin Glargine (BASAGLAR KWIKPEN) 100 UNIT/ML SOPN Inject 0.05 mLs (5 Units total) into the skin at bedtime. 3 mL 3  . Insulin Pen Needle 32G X 4 MM MISC Use to inject Insulin daily. Dx: E11.9 100 each 3  . INVOKANA 100 MG TABS tablet TAKE 1 TABLET BY MOUTH EVERY DAY BEFORE BREAKFAST 90 tablet 0  . Lancets (ONETOUCH ULTRASOFT) lancets Use as instructed 100 each 12  . metoprolol tartrate (LOPRESSOR) 25 MG tablet TAKE 1 TABLET (25 MG TOTAL) BY MOUTH 2 (TWO) TIMES DAILY. 60 tablet 5  . pramipexole (MIRAPEX) 0.125 MG tablet TAKE 1 TABLET (0.125 MG TOTAL) BY MOUTH AT BEDTIME. 90 tablet 0  . Saccharomyces boulardii (PROBIOTIC) 250 MG CAPS Take 250 mg by mouth daily. 28 capsule 0   No current facility-administered medications on file prior to visit.    Allergies  Allergen Reactions  . Latex Rash    No results found for this or any previous visit (from the past 2160 hour(s)).  Objective: There were no vitals filed for this visit.  General: Patient is awake, alert, oriented x 3 and in no acute distress.  Dermatology: Skin is warm and dry bilateral with a healed ulcertion at right 1st toe and medial ankle on right. There is no malodor, no active drainage, no erythema, no edema. + mild swelling. Callus sub met 5 on left and pre-ulcerative callus with dry blood  bilateral 1st toes and right sub met 5 with no opening. No acute signs of infection. Nails are thick and mildly elongated 1-5 bilateral.    Vascular: Dorsalis Pedis pulse = 1/4 Bilateral,  Posterior Tibial pulse = 1/4 Bilateral,  Capillary Fill Time < 5 seconds  Neurologic: Protective sensation diminished bilateral.   Musculosketal: No Pain with palpation to healed ulcerated area. No pain with compression to calves bilateral. Mild hammertoe bony deformities noted bilateral.  No results for input(s): GRAMSTAIN, LABORGA in the last 8760 hours.  Assessment and Plan:  Problem List Items Addressed  This Visit    None    Visit Diagnoses    Pain due to onychomycosis of toenail    -  Primary   Uncontrolled type 2 diabetes mellitus with hyperosmolarity without coma, without long-term current use of insulin ([place])          -Examined patient and discussed the progression of the Healed wound with osteomyelitis and treatment alternatives; will continue to monitor -Diabetic shoes dispensed with break in explained  -Mechanically debrided callus using sterile chisel blade compliementarily and nails 10 using a sterile nail nipper without incident - Advised patient to go to the ER or return to office if feet worsen  -Patient to return to office in 3 months for nail care.  Landis Martins, DPM

## 2017-09-21 NOTE — Patient Instructions (Signed)

## 2017-09-30 ENCOUNTER — Other Ambulatory Visit: Payer: Self-pay | Admitting: Adult Health

## 2017-09-30 NOTE — Telephone Encounter (Signed)
Left a message for pt to call back and schedule cpx.

## 2017-09-30 NOTE — Telephone Encounter (Signed)
Ok to refill for 90 days. Can we get him in for his CPE

## 2017-09-30 NOTE — Telephone Encounter (Signed)
Pt's last lipid panel was 05/23/2013.  Please advise.

## 2017-10-01 ENCOUNTER — Other Ambulatory Visit: Payer: Self-pay | Admitting: Neurology

## 2017-10-01 ENCOUNTER — Other Ambulatory Visit: Payer: Self-pay | Admitting: Adult Health

## 2017-10-01 NOTE — Telephone Encounter (Signed)
Left a message for a return call.

## 2017-10-14 ENCOUNTER — Encounter: Payer: Self-pay | Admitting: Family Medicine

## 2017-10-14 MED ORDER — ATORVASTATIN CALCIUM 80 MG PO TABS: 80.0000 mg | ORAL_TABLET | Freq: Every day | ORAL | 0 refills | Status: DC

## 2017-10-14 NOTE — Telephone Encounter (Signed)
Sent to the pharmacy by e-scribe for 90 days.  Have tried to reach the pt to schedule an appt.  Will send a letter in the mail.

## 2017-11-08 ENCOUNTER — Other Ambulatory Visit: Payer: Self-pay | Admitting: Adult Health

## 2017-11-08 DIAGNOSIS — IMO0001 Reserved for inherently not codable concepts without codable children: Secondary | ICD-10-CM

## 2017-11-08 DIAGNOSIS — E1165 Type 2 diabetes mellitus with hyperglycemia: Principal | ICD-10-CM

## 2017-11-10 NOTE — Telephone Encounter (Signed)
Left a message for a return call.  Pt past due for A1C

## 2017-11-12 NOTE — Telephone Encounter (Signed)
Left a message for a return call.

## 2017-11-22 NOTE — Telephone Encounter (Signed)
Left a message for a return call.  Pt now past due for A1C check.  Per Tommi Rumps, medication will not be filled.  Denied.

## 2017-11-29 ENCOUNTER — Other Ambulatory Visit: Payer: Self-pay | Admitting: Adult Health

## 2017-11-29 DIAGNOSIS — E1165 Type 2 diabetes mellitus with hyperglycemia: Principal | ICD-10-CM

## 2017-11-29 DIAGNOSIS — IMO0001 Reserved for inherently not codable concepts without codable children: Secondary | ICD-10-CM

## 2017-12-09 ENCOUNTER — Telehealth: Payer: Self-pay | Admitting: Family Medicine

## 2017-12-09 NOTE — Telephone Encounter (Signed)
That is fine with me. I wish him the best of luck

## 2017-12-09 NOTE — Telephone Encounter (Signed)
Copied from [place] [phone]. Topic: Appointment Scheduling - Scheduling Inquiry for Clinic >> Dec 09, 2017  3:42 PM Percell Belt A wrote: Reason for CRM: pt daughter called in and would like to know if pt could transfer to a provider in [place] due to his new location?  Would like be ok with Tommi Rumps?   number [phone]

## 2017-12-21 ENCOUNTER — Ambulatory Visit: Payer: BLUE CROSS/BLUE SHIELD | Admitting: Podiatry

## 2017-12-26 IMAGING — CT CT ANGIO CHEST
2 of 6 series · 19 of 36 positions shown · IV contrast (isovue)
Comparison: Chest radiographs [REDACTED] 9498 hours

CLINICAL DATA: Lethargy, hypoxia ; status post LEFT axillary
abscess incision and drainage. History hypertension, hyperlipidemia,
uncontrolled diabetes.

EXAM:
CT ANGIOGRAPHY CHEST WITH CONTRAST
TECHNIQUE: Multidetector CT imaging of the chest was performed using the
standard protocol during bolus administration of intravenous
contrast. Multiplanar CT image reconstructions and MIPs were
obtained to evaluate the vascular anatomy.
CONTRAST:  100 cc Isovue 370

[Series 6: pe thins @ 1mm · axial · 0.72mm/px · z∈[-328,-88]mm · 18 of 267 slices shown]
[im 14/267  lung]
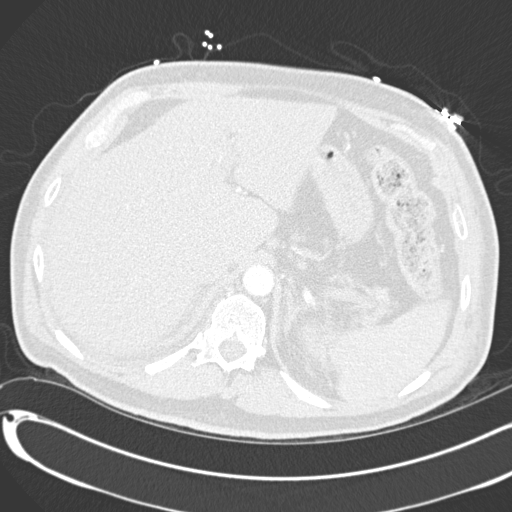
[im 27/267  mediastinal]
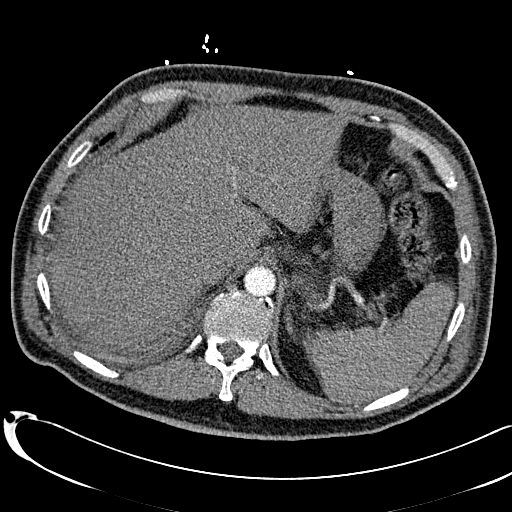
[im 40/267  lung]
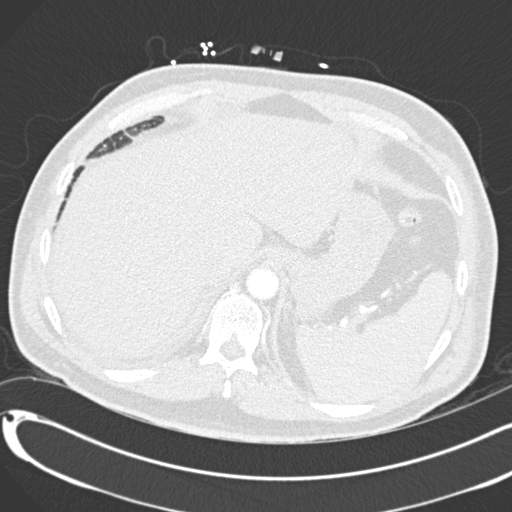
[im 54/267  mediastinal]
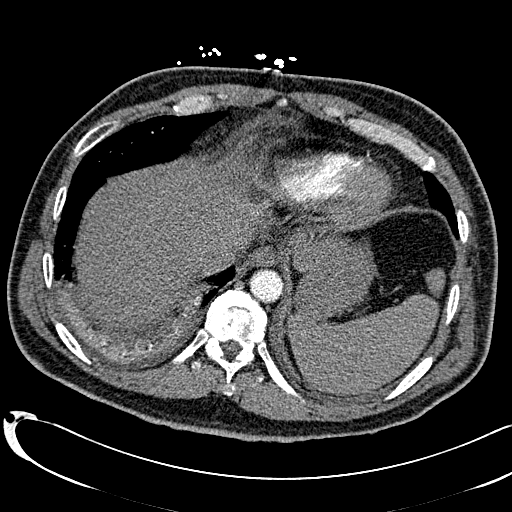
[im 67/267  lung]
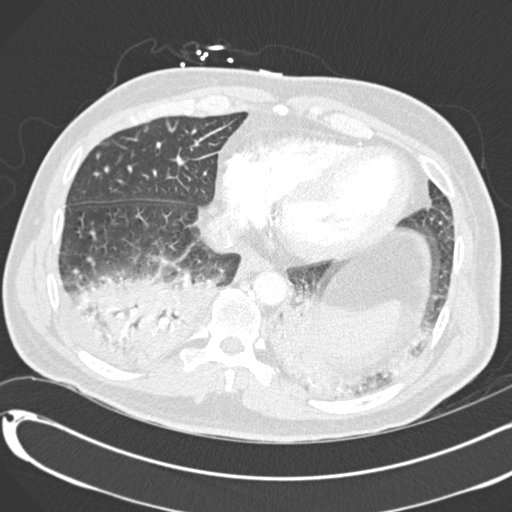
[im 80/267  mediastinal]
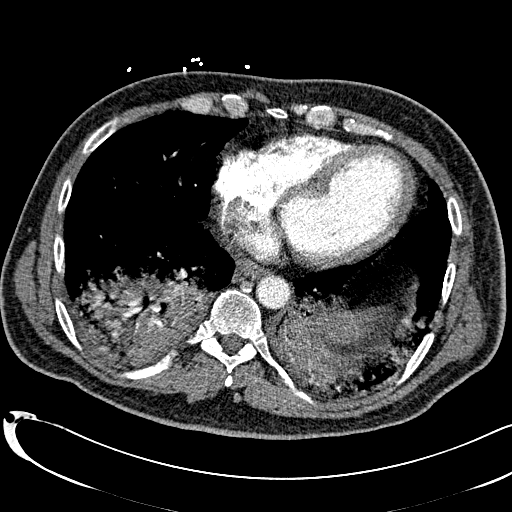
[im 94/267  lung]
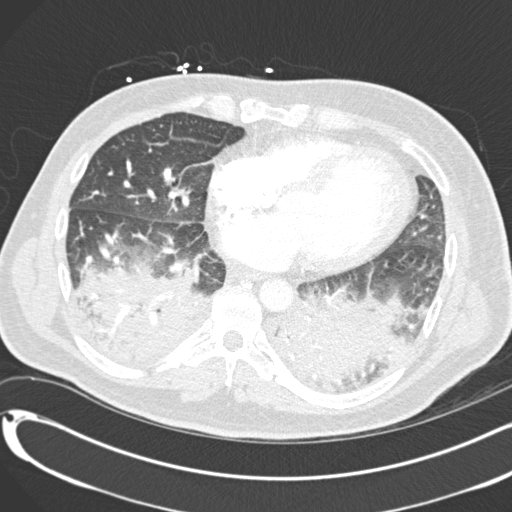
[im 107/267  mediastinal]
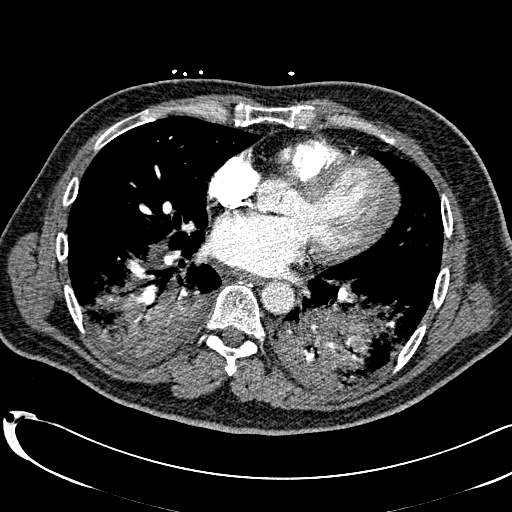
[im 120/267  lung]
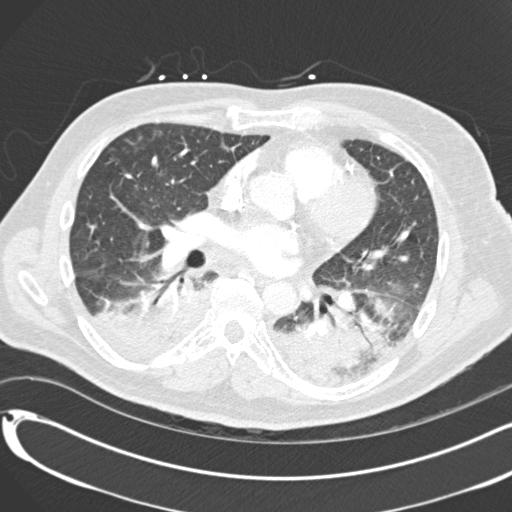
[im 147/267  mediastinal]
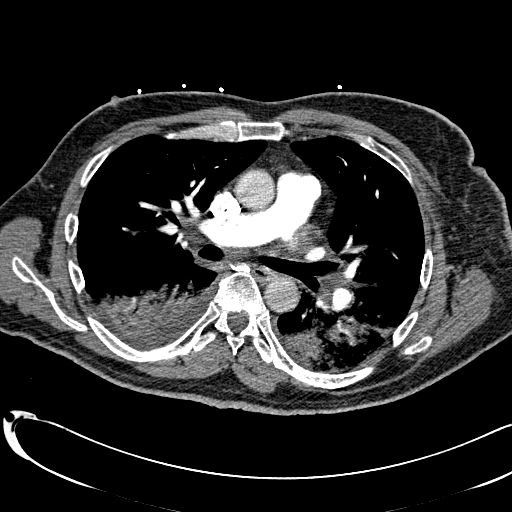
[im 160/267  lung]
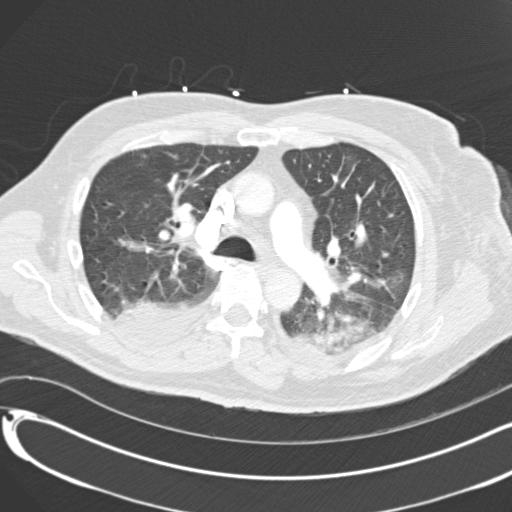
[im 173/267  mediastinal]
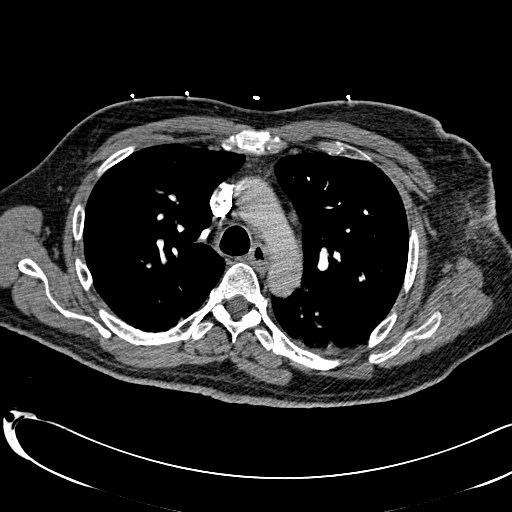
[im 187/267  lung]
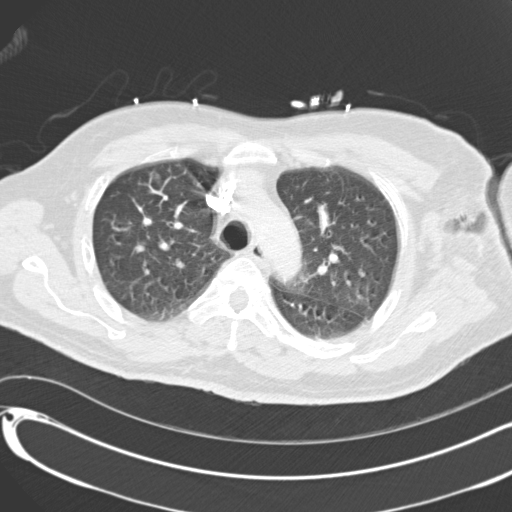
[im 200/267  mediastinal]
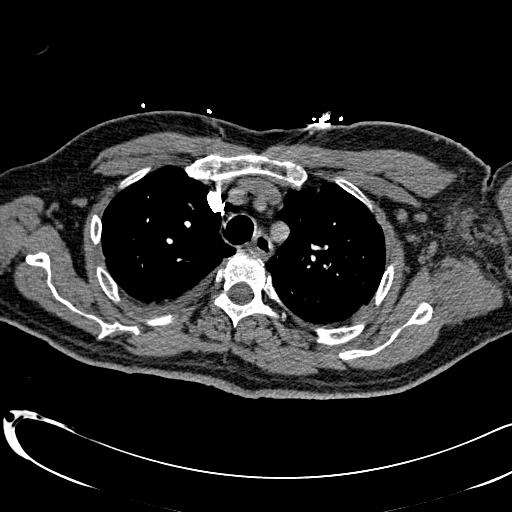
[im 213/267  lung]
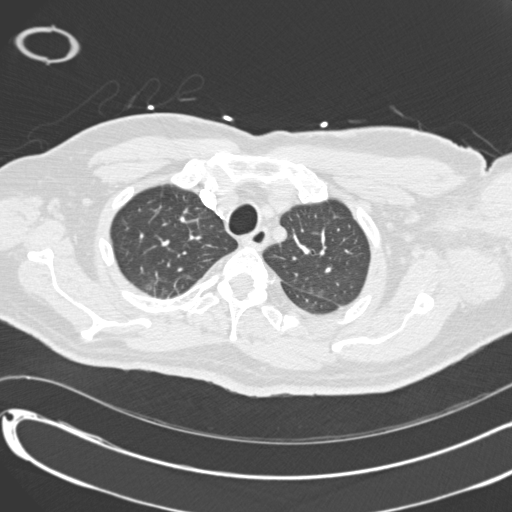
[im 227/267  mediastinal]
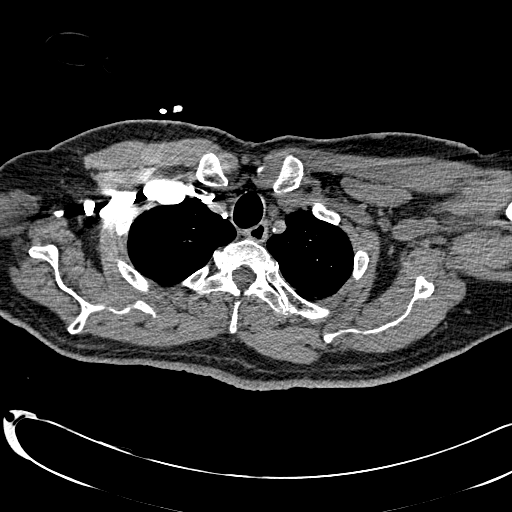
[im 240/267  lung]
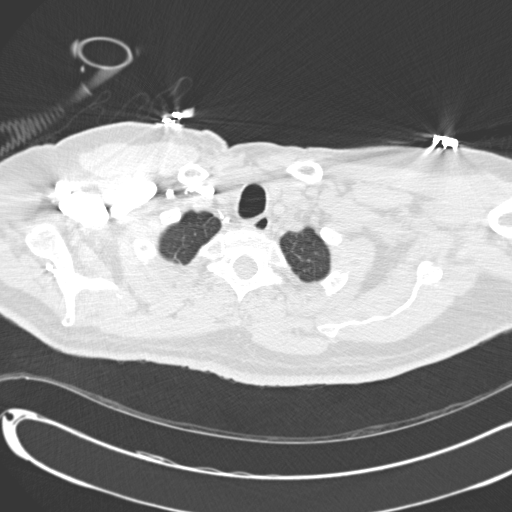
[im 253/267  mediastinal]
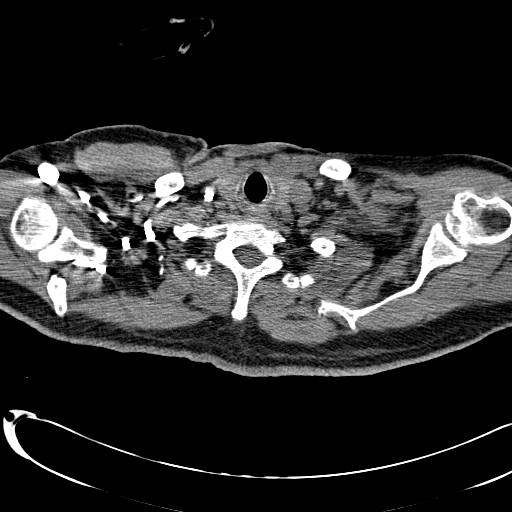

[Series 602: <mpr thick range> · coronal · 0.72mm/px · 1 of 119 slices shown]
[im 60/119  mediastinal]
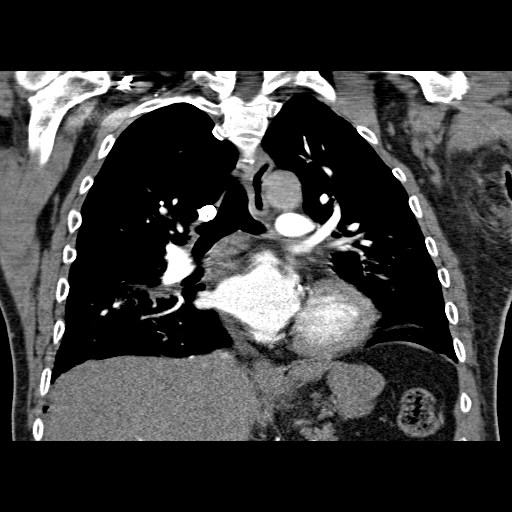

[19 of 36 positions shown; findings below may reference images not displayed]

FINDINGS: PULMONARY ARTERY: Adequate contrast opacification of the pulmonary
artery's. Main pulmonary artery is not enlarged. No pulmonary
arterial filling defects to the level of the subsegmental branches.

MEDIASTINUM: Heart size is mildly enlarged. No RIGHT heart strain.
Scattered coronary artery calcifications. No pericardial effusion.
Thoracic aorta is normal course and caliber, trace calcific
atherosclerosis at the aortic arch.

LUNGS: Tracheobronchial tree is patent, no pneumothorax. Dense hypo
enhancing consolidation in the lower lobes bilaterally air
bronchograms.

SOFT TISSUES AND OSSEOUS STRUCTURES: Included view of the abdomen is
unremarkable. LEFT axilla skin defect with small surrounding lymph
nodes compatible with history of abscess drainage, no focal fluid
collection.
IMPRESSION: No acute pulmonary embolism.

Bilateral lower lobe pneumonia.

Status post LEFT axillary incision and drainage, no focal fluid
collection.

## 2017-12-28 ENCOUNTER — Ambulatory Visit: Payer: BLUE CROSS/BLUE SHIELD | Admitting: Sports Medicine

## 2017-12-28 IMAGING — CR DG CHEST 1V PORT
1 series · 1 of 1 positions shown · non-contrast
Comparison: 06/11/2016

CLINICAL DATA: Shortness of breath.

EXAM:
PORTABLE CHEST 1 VIEW

[AP]
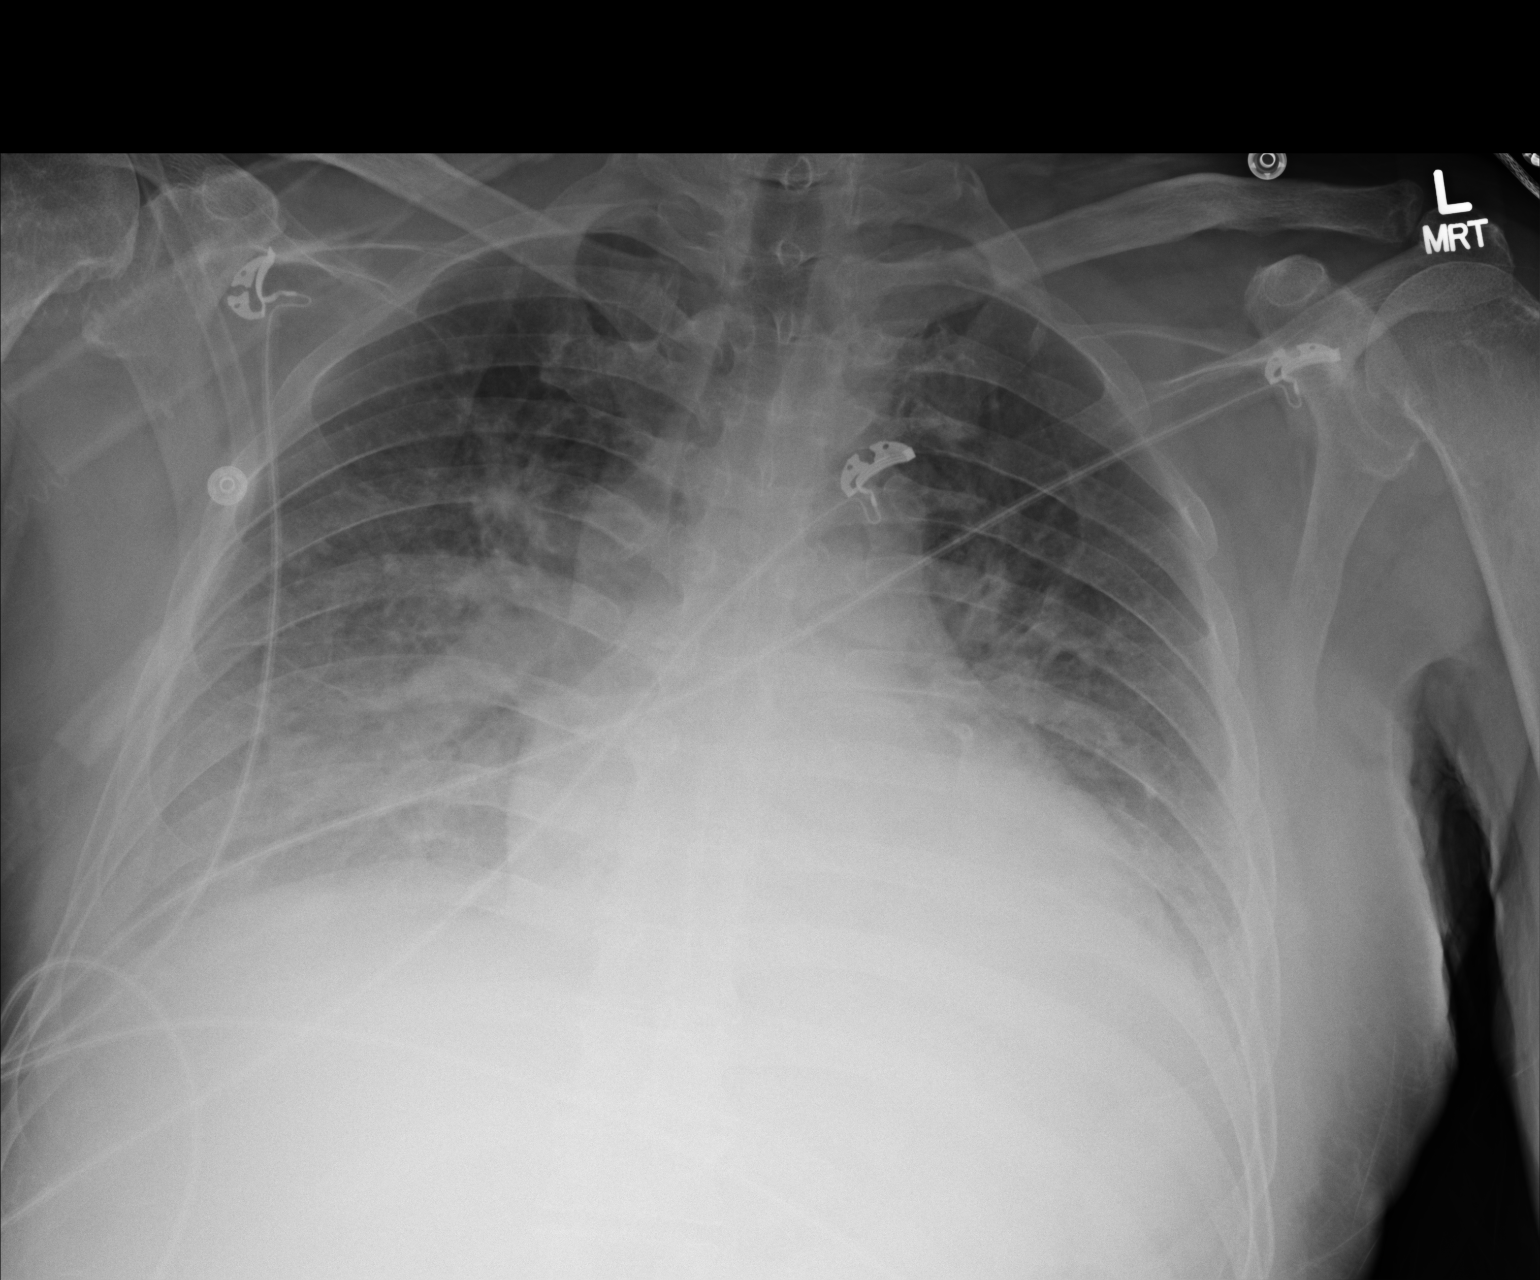

[1 of 1 positions shown; findings below may reference images not displayed]

FINDINGS: Mild cardiac enlargement. Bilateral pleural effusions and moderate
interstitial edema is identified compatible with CHF.
IMPRESSION: Moderate CHF.

## 2017-12-30 IMAGING — CR DG CHEST 1V PORT
1 series · 1 of 1 positions shown · non-contrast
Comparison: Chest x-ray dated 06/13/2016

CLINICAL DATA: Shortness of breath. Cough. Hypoxia. Acute
myocardial infarction.

EXAM:
PORTABLE CHEST 1 VIEW

[AP]
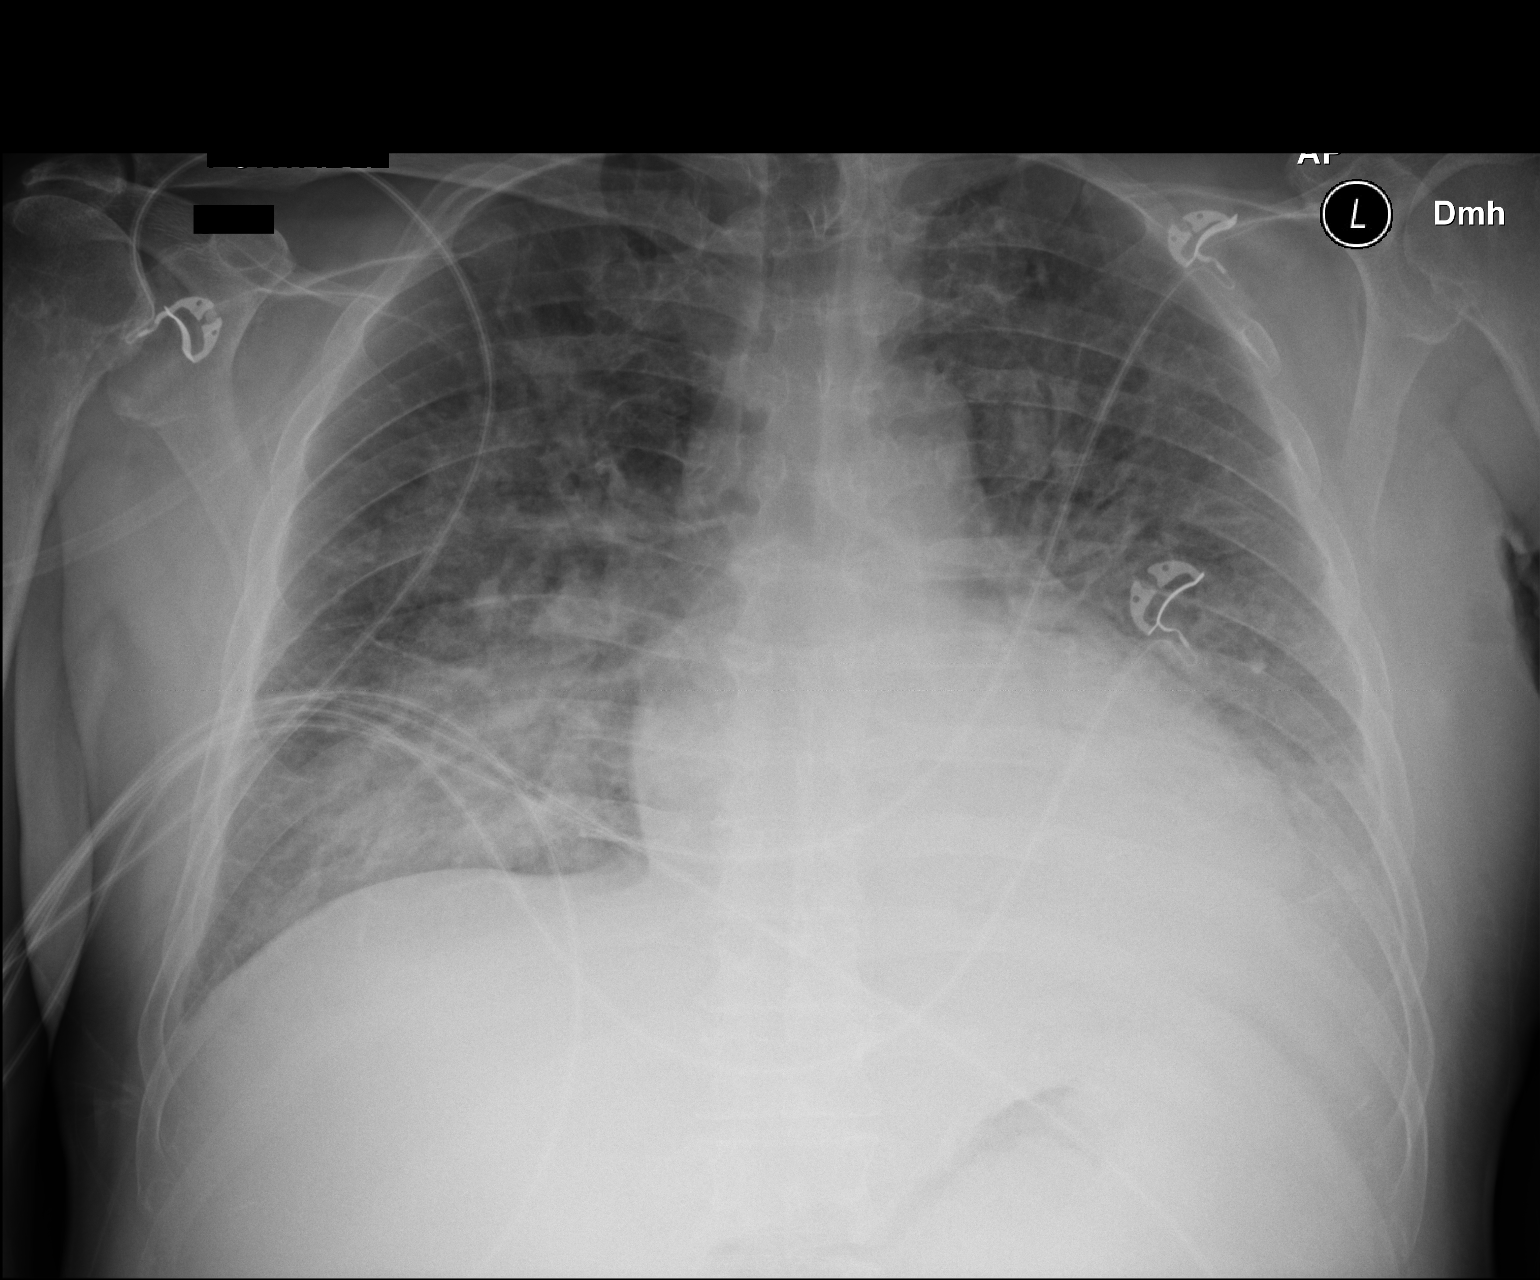

[1 of 1 positions shown; findings below may reference images not displayed]

FINDINGS: There is persistent bilateral pulmonary edema with Kerley B-lines at
the bases. Small left pleural effusion. Heart size is normal. Slight
pulmonary vascular congestion.

No acute bone abnormality. Arthritic changes of the right shoulder
with evidence consistent with chronic rotator cuff disease.
IMPRESSION: Persistent bilateral pulmonary edema with increased small left
effusion.

## 2018-01-04 ENCOUNTER — Ambulatory Visit: Payer: BLUE CROSS/BLUE SHIELD | Admitting: Sports Medicine

## 2018-01-05 ENCOUNTER — Encounter: Payer: Self-pay | Admitting: Family Medicine

## 2018-01-05 ENCOUNTER — Other Ambulatory Visit: Payer: Self-pay | Admitting: Adult Health

## 2018-01-05 ENCOUNTER — Ambulatory Visit: Payer: BLUE CROSS/BLUE SHIELD | Admitting: Family Medicine

## 2018-01-05 VITALS — BP 102/78 | HR 92 | Temp 98.2°F | Ht 64.75 in | Wt 171.5 lb

## 2018-01-05 DIAGNOSIS — J0110 Acute frontal sinusitis, unspecified: Secondary | ICD-10-CM

## 2018-01-05 DIAGNOSIS — Z720 Tobacco use: Secondary | ICD-10-CM

## 2018-01-05 DIAGNOSIS — F339 Major depressive disorder, recurrent, unspecified: Secondary | ICD-10-CM

## 2018-01-05 DIAGNOSIS — E1165 Type 2 diabetes mellitus with hyperglycemia: Secondary | ICD-10-CM

## 2018-01-05 DIAGNOSIS — Z7689 Persons encountering health services in other specified circumstances: Secondary | ICD-10-CM

## 2018-01-05 LAB — CBC WITH DIFFERENTIAL/PLATELET
BASOS PCT: 0.6 % (ref 0.0–3.0)
Basophils Absolute: 0 10*3/uL (ref 0.0–0.1)
EOS PCT: 3 % (ref 0.0–5.0)
Eosinophils Absolute: 0.2 10*3/uL (ref 0.0–0.7)
HCT: 43.1 % (ref 39.0–52.0)
HEMOGLOBIN: 14.5 g/dL (ref 13.0–17.0)
LYMPHS PCT: 28.7 % (ref 12.0–46.0)
Lymphs Abs: 2.2 10*3/uL (ref 0.7–4.0)
MCHC: 33.7 g/dL (ref 30.0–36.0)
MCV: 85 fl (ref 78.0–100.0)
MONOS PCT: 11.4 % (ref 3.0–12.0)
Monocytes Absolute: 0.9 10*3/uL (ref 0.1–1.0)
Neutro Abs: 4.2 10*3/uL (ref 1.4–7.7)
Neutrophils Relative %: 56.3 % (ref 43.0–77.0)
Platelets: 118 10*3/uL — ABNORMAL LOW (ref 150.0–400.0)
RBC: 5.07 Mil/uL (ref 4.22–5.81)
RDW: 13.9 % (ref 11.5–15.5)
WBC: 7.5 10*3/uL (ref 4.0–10.5)

## 2018-01-05 LAB — HEMOGLOBIN A1C: Hgb A1c MFr Bld: 14 % — ABNORMAL HIGH (ref 4.6–6.5)

## 2018-01-05 LAB — MICROALBUMIN / CREATININE URINE RATIO
Creatinine,U: 26 mg/dL
Microalb Creat Ratio: 49.4 mg/g — ABNORMAL HIGH (ref 0.0–30.0)
Microalb, Ur: 12.9 mg/dL — ABNORMAL HIGH (ref 0.0–1.9)

## 2018-01-05 MED ORDER — AZITHROMYCIN 250 MG PO TABS: ORAL_TABLET | ORAL | 0 refills | Status: DC

## 2018-01-05 MED ORDER — GLIPIZIDE 10 MG PO TABS: 10.0000 mg | ORAL_TABLET | Freq: Two times a day (BID) | ORAL | 0 refills | Status: DC

## 2018-01-05 MED ORDER — ONETOUCH ULTRASOFT LANCETS MISC: 12 refills | Status: AC

## 2018-01-05 MED ORDER — CANAGLIFLOZIN 100 MG PO TABS: ORAL_TABLET | ORAL | 1 refills | Status: DC

## 2018-01-05 MED ORDER — INSULIN PEN NEEDLE 32G X 4 MM MISC: 3 refills | Status: DC

## 2018-01-05 NOTE — Patient Instructions (Addendum)
Good to see you today  Please follow up in 3 months for CPE  Hold Celexa while on antibiotic

## 2018-01-05 NOTE — Telephone Encounter (Signed)
Denied.  Pt now has new PCP

## 2018-01-05 NOTE — Progress Notes (Signed)
Subjective:    Patient ID: [name], male    DOB: 05/22/1961, 57 y.o.   MRN: [id number]  HPI This is a 57 yo male who presents today to establish care. He is divorced. He is a Air traffic controller. He enjoys his work. Spends time with his grandchildren.   He had a heart attack 2 years ago. He stopped smoking for 9 months but picked it back up during a stressful time.   Started on celexa, not sure if it helped. Continues to feel depressed, sleeps a lot when not with his grandchildren. Denies SI/HI. Feels that his mood has gradually improved from MI/CABG and he anticipates it will continue to improve.   Chronic bilateral shoulder issues, right shoulder pain and left rotator cuff torn.   DM type 2- Has not followed up from 7/18 ov, HgbA1C at that time was 9.7. He hasn't been taking his medications regularly, has been using about 5 units of insulin (basaglar) at night (to save it, not sure if he would be able to get a refill, but just had a refill sent from prior PCP), has been out of invokana and glipizide for awhile. Needs pens needles and lancets. Rarely checks blood sugars and they have been running upper 100s- over 200. Intolerant to metformin- severe GI side effects.   Has had nasal congestion, nasal drainage, cough off and on x 6 weeks. Nasal drainage and sputum green/brown. No wheeze, SOB. Co-workers have been sick. Seems to get better then worse.   Last CPE- unsure Colonoscopy- never Tdap- unsure Flu- declines Exercise- walks a lot at work, plays with his grandchild   Past Medical History:  Diagnosis Date  . Abrasion L FOREARM  . Atrial fibrillation ([place])    a. initially occurring in post-op setting in 05/2016, started on Eliquis b. 08/2016: s/p clipping of atrial appendage at time of CABG --> Eliquis switched to Coumadin  . CAD (coronary artery disease)    a. 05/2016: NSTEMI, cath showing 3v disease with CABG recommended once MRSA infection resolved. b. 08/2016:  CABG with LIMA-LAD, SVG-D1, and SVG-RI.  Marland Kitchen Cancer ([place])    basal cell carcinoma of the skin  . Diabetes mellitus without complication ([place])   . Headache(784.0)   . Hematuria, microscopic "SINCE I WAS A KID"  . Hyperlipidemia   . Hypertension   . Ischemic cardiomyopathy    a. 05/2016: echo w/ EF of 25-35% and akinesis of the basal-midinferolateral and inferior myocardium.  . Lactose intolerance   . Myocardial infarction ([place])   . Respiratory failure ([place])   . Rotator cuff tear RIGHT  . Tobacco use    a. quit in 05/2016 following MI   Past Surgical History:  Procedure Laterality Date  . CARDIAC CATHETERIZATION N/A 06/16/2016   Procedure: Left Heart Cath and Coronary Angiography;  Surgeon: Troy Sine, MD;  Location: [facility];  Service: Cardiovascular;  Laterality: N/A;  . CLIPPING OF ATRIAL APPENDAGE N/A 08/24/2016   Procedure: CLIPPING OF ATRIAL APPENDAGE;  Surgeon: Ivin Poot, MD;  Location: [facility];  Service: Open Heart Surgery;  Laterality: N/A;  . CORONARY ARTERY BYPASS GRAFT N/A 08/24/2016   Procedure: CORONARY ARTERY BYPASS GRAFTING (CABG) x three,  using left internal mammary artery and right leg greater saphenous vein harvested endoscopically;  Surgeon: Ivin Poot, MD;  Location: [facility];  Service: Open Heart Surgery;  Laterality: N/A;  . HERNIA REPAIR  X2  . INCISION AND DRAINAGE ABSCESS Left 06/10/2016  Procedure: INCISION AND DRAINAGE LEFT AXILLARY ABSCESS;  Surgeon: Autumn Messing III, MD;  [facility];  Service: General;  Laterality: Left;  . ROTATOR CUFF REPAIR Bilateral   . TEE WITHOUT CARDIOVERSION N/A 08/24/2016   Procedure: TRANSESOPHAGEAL ECHOCARDIOGRAM (TEE);  Surgeon: Ivin Poot, MD;  Location: [facility];  Service: Open Heart Surgery;  Laterality: N/A;   Family History  Problem Relation Age of Onset  . Hypertension Father   . Sudden death Father   . Diabetes Father   . Heart attack Father   . Hypertension Mother   . Heart attack Sister     Social History   Tobacco Use  . Smoking status: Current Every Day Smoker    Packs/day: 0.50    Types: Cigarettes  . Smokeless tobacco: Never Used  Substance Use Topics  . Alcohol use: No  . Drug use: No      Review of Systems  Constitutional: Negative for fever.  HENT: Positive for congestion and sinus pressure. Negative for sore throat.   Respiratory: Positive for cough. Negative for shortness of breath.   Cardiovascular: Negative for chest pain, palpitations and leg swelling.  Gastrointestinal: Negative for abdominal pain, blood in stool, constipation, diarrhea, nausea and vomiting.  Genitourinary: Negative for difficulty urinating, dysuria and hematuria.  Musculoskeletal: Positive for arthralgias (bilateral shoulders).  Neurological: Negative for dizziness, light-headedness and headaches.  Psychiatric/Behavioral: Positive for dysphoric mood and sleep disturbance (hypersomnolent). Negative for self-injury and suicidal ideas. The patient is not nervous/anxious.        Objective:   Physical Exam  Constitutional: He is oriented to person, place, and time. He appears well-developed and well-nourished. No distress.  HENT:  Head: Normocephalic and atraumatic.  Right Ear: Tympanic membrane, external ear and ear canal normal.  Left Ear: Tympanic membrane, external ear and ear canal normal.  Nose: Mucosal edema and rhinorrhea present. Right sinus exhibits maxillary sinus tenderness. Right sinus exhibits no frontal sinus tenderness. Left sinus exhibits maxillary sinus tenderness. Left sinus exhibits no frontal sinus tenderness.  Mouth/Throat: Uvula is midline, oropharynx is clear and moist and mucous membranes are normal.  Eyes: Conjunctivae are normal.  Cardiovascular: Normal rate, regular rhythm and normal heart sounds.  Pulmonary/Chest: Effort normal and breath sounds normal.  Musculoskeletal: Normal range of motion. He exhibits edema (trace pedal).  Neurological: He is alert  and oriented to person, place, and time.  Skin: Skin is warm and dry. He is not diaphoretic.  Bilateral feet with mildly dry skin, no breakdown, no calluses, gross sensation intact.   Psychiatric: He has a normal mood and affect. His behavior is normal. Judgment and thought content normal.  Vitals reviewed.     BP 102/78   Pulse 92   Temp 98.2 F (36.8 C) (Oral)   Ht 5' 4.75" (1.645 m)   Wt 171 lb 8 oz (77.8 kg)   SpO2 97%   BMI 28.76 kg/m  Wt Readings from Last 3 Encounters:  01/05/18 171 lb 8 oz (77.8 kg)  04/27/17 172 lb (78 kg)  02/18/17 171 lb (77.6 kg)   BP Readings from Last 3 Encounters:  01/05/18 102/78  04/27/17 102/72  02/18/17 114/75   Depression screen PHQ 2/9 01/05/2018 02/18/2017 10/26/2016  Decreased Interest 0 1 2  Down, Depressed, Hopeless 0 1 2  PHQ - 2 Score 0 2 4  Altered sleeping - 1 1  Tired, decreased energy - 1 1  Change in appetite - 0 0  Feeling bad or failure about  yourself  - 0 2  Trouble concentrating - 0 0  Moving slowly or fidgety/restless - 1 0  Suicidal thoughts - 0 0  PHQ-9 Score - 5 8  Difficult doing work/chores - Somewhat difficult Somewhat difficult       Assessment & Plan:  1. Encounter to establish care  - Discussed and encouraged healthy lifestyle choices- adequate sleep, regular exercise, stress management and healthy food choices.  - follow up in 3 months, sooner if necessitated by labs  2. Uncontrolled type 2 diabetes mellitus with hyperglycemia (HCC) - canagliflozin (INVOKANA) 100 MG TABS tablet; TAKE 1 TABLET BY MOUTH EVERY DAY BEFORE BREAKFAST  Dispense: 90 tablet; Refill: 1 - glipiZIDE (GLUCOTROL) 10 MG tablet; Take 1 tablet (10 mg total) by mouth 2 (two) times daily before a meal. **  NO FURTHER REFILLS UNTIL SEEN IN OFFICE **  Dispense: 30 tablet; Refill: 0 - Lancets (ONETOUCH ULTRASOFT) lancets; Use as instructed  Dispense: 100 each; Refill: 12 - Insulin Pen Needle 32G X 4 MM MISC; Use to inject Insulin daily. Dx:  E11.9  Dispense: 100 each; Refill: 3 - CBC with Differential - Comprehensive metabolic panel - Microalbumin / creatinine urine ratio - Hemoglobin A1c - Lipid Panel  3. Acute non-recurrent frontal sinusitis - azithromycin (ZITHROMAX) 250 MG tablet; Take 2 tabs PO x 1 dose, then 1 tab PO QD x 4 days  Dispense: 6 tablet; Refill: 0  6. Tobacco abuse disorder - not currently interested in cessation although he understands health benefits - encouraged further contemplation   Clarene Reamer, FNP-BC  [facility] at [facility], [place]  01/06/2018 4:07 PM

## 2018-01-06 ENCOUNTER — Encounter: Payer: Self-pay | Admitting: Family Medicine

## 2018-01-06 ENCOUNTER — Telehealth: Payer: Self-pay | Admitting: Radiology

## 2018-01-06 LAB — COMPREHENSIVE METABOLIC PANEL
ALBUMIN: 4.2 g/dL (ref 3.5–5.2)
ALT: 31 U/L (ref 0–53)
AST: 28 U/L (ref 0–37)
Alkaline Phosphatase: 150 U/L — ABNORMAL HIGH (ref 39–117)
BUN: 32 mg/dL — ABNORMAL HIGH (ref 6–23)
CHLORIDE: 97 meq/L (ref 96–112)
CO2: 25 mEq/L (ref 19–32)
CREATININE: 1.36 mg/dL (ref 0.40–1.50)
Calcium: 9.2 mg/dL (ref 8.4–10.5)
GFR: 57.49 mL/min — ABNORMAL LOW (ref >60.00)
Glucose, Bld: 600 mg/dL (ref 70–99)
POTASSIUM: 4.8 meq/L (ref 3.5–5.1)
Sodium: 132 mEq/L — ABNORMAL LOW (ref 135–145)
Total Bilirubin: 0.3 mg/dL (ref 0.2–1.2)
Total Protein: 7.1 g/dL (ref 6.0–8.3)

## 2018-01-06 LAB — LDL CHOLESTEROL, DIRECT: Direct LDL: 75 mg/dL

## 2018-01-06 LAB — LIPID PANEL
CHOL/HDL RATIO: 5
CHOLESTEROL: 137 mg/dL (ref 0–200)
HDL: 29.3 mg/dL — ABNORMAL LOW (ref >39.00)
NonHDL: 107.96
Triglycerides: 334 mg/dL — ABNORMAL HIGH (ref 0.0–149.0)
VLDL: 66.8 mg/dL — ABNORMAL HIGH (ref 0.0–40.0)

## 2018-01-06 NOTE — Telephone Encounter (Signed)
Tried to phone patient again to be sure he has received the message.  Still no answer, left another VM asking patient to call and confirm receipt of our message and be sure he understands to go to the ER.

## 2018-01-06 NOTE — Telephone Encounter (Signed)
Call pt.  Advise ER.  Thanks.

## 2018-01-06 NOTE — Telephone Encounter (Signed)
Left detailed message on voicemail and am trying to confirm that patient received the message.

## 2018-01-06 NOTE — Telephone Encounter (Signed)
Left detailed message on voicemail and will continue to try to call to relay the message personally.

## 2018-01-06 NOTE — Telephone Encounter (Signed)
Tried again to reach patient,no answer.

## 2018-01-06 NOTE — Telephone Encounter (Signed)
Patient stated he did not go to the ER.  He saw Tor Netters, [place] 01/05/18 for a get acquainted appt. He was given a zpack at this appt.

## 2018-01-06 NOTE — Telephone Encounter (Signed)
[facility] called a critical Glucose - 600., results given to Dr Damita Dunnings.

## 2018-01-06 NOTE — Telephone Encounter (Signed)
Tried to call to verify that the patient had received the earlier messages, still no answer.

## 2018-01-07 ENCOUNTER — Other Ambulatory Visit: Payer: Self-pay | Admitting: Family Medicine

## 2018-01-07 ENCOUNTER — Ambulatory Visit: Payer: Self-pay | Admitting: *Deleted

## 2018-01-07 DIAGNOSIS — Z951 Presence of aortocoronary bypass graft: Secondary | ICD-10-CM

## 2018-01-07 DIAGNOSIS — E1165 Type 2 diabetes mellitus with hyperglycemia: Secondary | ICD-10-CM

## 2018-01-07 NOTE — Telephone Encounter (Signed)
Did you tell him to go to ER?  If not, then call him and tell him to do so.  Thanks.  Late entry, but I called his daughter and LMOVM for her to tell patient to call the on call line.

## 2018-01-07 NOTE — Telephone Encounter (Signed)
Called and spoke with patient, his blood sugar is 269. He agrees to check his blood sugar at least twice a day. I instructed him to increase his insulin glargine to 20 units daily and continue Invokana and glipizide, encouraged him to  push water. He agrees to endocrine referral. He will log his blood sugars and follow up with me in 5 days with readings, sooner if any s/s hypoglycemia. He verbalized understanding.

## 2018-01-07 NOTE — Telephone Encounter (Signed)
Left detailed message for pt to go to ED. I did speak with [name] and advised per note that pt needs to go to ED now due to BS of 600. [name] said he has battled high BS for years and had a heart attack due to out of control BS. Wende Crease said pt works 3rd shift but she will make sure pt goes to [facility] now. FYI to Dr Damita Dunnings and Glenda Chroman FNP.Marland Kitchen

## 2018-01-07 NOTE — Telephone Encounter (Signed)
   Answer Assessment - Initial Assessment Questions Patient called after receiving a message from the office to go to the ED because his glucose was over 600. He stated she was talking about a sugar that was from lab work on Wednesday 3/20. Sugar last night was 208. He hasn't checked it this morning as he's at work. He denies any symptoms at this time. He stated he has talked with physician regarding insulin and oral agents that he feels aren't working well enough for him. Stated his sugar fluctuates a lot and that unless it's low, he usually doesn't have symptoms. I reviewed high and low sx with him and touched on diet and regularly exercise, checking sugars regularly and increasing water intake.    1. BLOOD GLUCOSE: "What is your blood glucose level?"     208 last night before he left for work 2. ONSET: "When did you check the blood glucose?"  9-10 pm last night before he left for work at midnight. 3. USUAL RANGE: "What is your glucose level usually?" (e.g., usual fasting morning value, usual evening value)   He stated his glucose varies a lot. 4. KETONES: "Do you check for ketones (urine or blood test strips)?" If yes, ask: "What does the test show now?"      no 5. TYPE 1 or 2:  "Do you know what type of diabetes you have?"  (e.g., Type 1, Type 2, Gestational; doesn't know)    Type 2 6. INSULIN: "Do you take insulin?" If yes, ask: "Have you missed any shots recently?"   Yes and he hasn't missed any. 7. DIABETES PILLS: "Do you take any pills for your diabetes?" If yes, ask: "Have you missed taking any pills recently?"   Yes and he takes it regularly 8. OTHER SYMPTOMS: "Do you have any symptoms?" (e.g., fever, frequent urination, difficulty breathing, dizziness, weakness, vomiting)   No. none 9. PREGNANCY: "Is there any chance you are pregnant?" "When was your last menstrual period?" na  Protocols used: DIABETES - HIGH BLOOD SUGAR-A-AH

## 2018-01-07 NOTE — Telephone Encounter (Signed)
I'll defer to PCP at this point.   We have advised ER.  Thanks.

## 2018-01-07 NOTE — Telephone Encounter (Signed)
Called an spoke with patient who was on his way home from work (just worked 12a-12p). Blood sugar yesterday was 208. He will check his blood sugar when he gets home and I will call him back shortly. Will refer to endocrinology.

## 2018-01-12 ENCOUNTER — Ambulatory Visit: Payer: BLUE CROSS/BLUE SHIELD | Admitting: Family Medicine

## 2018-01-12 ENCOUNTER — Encounter: Payer: Self-pay | Admitting: Family Medicine

## 2018-01-12 VITALS — BP 136/82 | HR 91 | Temp 98.3°F | Wt 172.5 lb

## 2018-01-12 DIAGNOSIS — Z951 Presence of aortocoronary bypass graft: Secondary | ICD-10-CM | POA: Diagnosis not present

## 2018-01-12 DIAGNOSIS — R159 Full incontinence of feces: Secondary | ICD-10-CM | POA: Diagnosis not present

## 2018-01-12 DIAGNOSIS — Z720 Tobacco use: Secondary | ICD-10-CM

## 2018-01-12 DIAGNOSIS — E1165 Type 2 diabetes mellitus with hyperglycemia: Secondary | ICD-10-CM

## 2018-01-12 NOTE — Progress Notes (Signed)
Subjective:    Patient ID: [name], male    DOB: October 26, 1960, 57 y.o.   MRN: [id number]  HPI This is a 57 yo male, accompanied by his daughter, who presents today for follow up of elevated blood sugar. He was seen last week and had hemoglobin A1c of 14 and glucose 600, he declined going to ER. Increased his insulin to 20 units before sleeping (works rotating shifts). Will sometimes skip a dose if he is switching shifts. Drinks 1 soda daily, down from 5+ a day. Is working to [employer], is motivated.   Blood sugars range 112- 435 (two readings over 300 at beginning of checking). Yesterday was 112 in evening and 208 this morning.   Diarrhea- Has had long standing daily loose bowel movements, usually 1-3 per day. has some fecal incontinence. Always when asleep, occurs about 1x a month, was 2-3 times a week following CABG.   CAD- Hasn't seen cardiology in over a year. Requests new referral to cardiology in [place] since he moved. Denies any chest pain, SOB. Has been exercising on equipment at work, no CP, gets HR up to 170.   URI- was treated with Zpack last week, reports decreased cough, sputum production, feels better.     Past Medical History:  Diagnosis Date  . Abrasion L FOREARM  . Atrial fibrillation ([place])    a. initially occurring in post-op setting in 05/2016, started on Eliquis b. 08/2016: s/p clipping of atrial appendage at time of CABG --> Eliquis switched to Coumadin  . CAD (coronary artery disease)    a. 05/2016: NSTEMI, cath showing 3v disease with CABG recommended once MRSA infection resolved. b. 08/2016: CABG with LIMA-LAD, SVG-D1, and SVG-RI.  Marland Kitchen Cancer ([place])    basal cell carcinoma of the skin  . Diabetes mellitus without complication ([place])   . Headache(784.0)   . Hematuria, microscopic "SINCE I WAS A KID"  . Hyperlipidemia   . Hypertension   . Ischemic cardiomyopathy    a. 05/2016: echo w/ EF of 25-35% and akinesis of the basal-midinferolateral  and inferior myocardium.  . Lactose intolerance   . Myocardial infarction ([place])   . Respiratory failure ([place])   . Rotator cuff tear RIGHT  . Tobacco use    a. quit in 05/2016 following MI   Past Surgical History:  Procedure Laterality Date  . CARDIAC CATHETERIZATION N/A 06/16/2016   Procedure: Left Heart Cath and Coronary Angiography;  Surgeon: Troy Sine, MD;  Location: [facility];  Service: Cardiovascular;  Laterality: N/A;  . CLIPPING OF ATRIAL APPENDAGE N/A 08/24/2016   Procedure: CLIPPING OF ATRIAL APPENDAGE;  Surgeon: Ivin Poot, MD;  Location: [facility];  Service: Open Heart Surgery;  Laterality: N/A;  . CORONARY ARTERY BYPASS GRAFT N/A 08/24/2016   Procedure: CORONARY ARTERY BYPASS GRAFTING (CABG) x three,  using left internal mammary artery and right leg greater saphenous vein harvested endoscopically;  Surgeon: Ivin Poot, MD;  Location: [facility];  Service: Open Heart Surgery;  Laterality: N/A;  . HERNIA REPAIR  X2  . INCISION AND DRAINAGE ABSCESS Left 06/10/2016   Procedure: INCISION AND DRAINAGE LEFT AXILLARY ABSCESS;  Surgeon: Autumn Messing III, MD;  [facility];  Service: General;  Laterality: Left;  . ROTATOR CUFF REPAIR Bilateral   . TEE WITHOUT CARDIOVERSION N/A 08/24/2016   Procedure: TRANSESOPHAGEAL ECHOCARDIOGRAM (TEE);  Surgeon: Ivin Poot, MD;  Location: [facility];  Service: Open Heart Surgery;  Laterality: N/A;  Family History  Problem Relation Age of Onset  . Hypertension Father   . Sudden death Father   . Diabetes Father   . Heart attack Father   . Hypertension Mother   . Heart attack Sister    Social History   Tobacco Use  . Smoking status: Current Every Day Smoker    Packs/day: 0.50    Types: Cigarettes  . Smokeless tobacco: Never Used  Substance Use Topics  . Alcohol use: No  . Drug use: No      Review of Systems Per HPI    Objective:   Physical Exam  Constitutional: He is oriented to person, place, and time. He appears  well-developed and well-nourished. No distress.  HENT:  Head: Normocephalic and atraumatic.  Cardiovascular: Normal rate.  Pulmonary/Chest: Effort normal.  Neurological: He is alert and oriented to person, place, and time.  Skin: He is not diaphoretic.  Psychiatric: He has a normal mood and affect. His behavior is normal. Judgment and thought content normal.  Vitals reviewed.    BP 136/82   Pulse 91   Temp 98.3 F (36.8 C) (Oral)   Wt 172 lb 8 oz (78.2 kg)   SpO2 97%   BMI 28.93 kg/m   Wt Readings from Last 3 Encounters:  01/12/18 172 lb 8 oz (78.2 kg)  01/05/18 171 lb 8 oz (77.8 kg)  04/27/17 172 lb (78 kg)       Assessment & Plan:  1. Uncontrolled type 2 diabetes mellitus with hyperglycemia ([place]) - Improvement in readings following increased insulin and resumption of oral meds. Awaiting endocrine appointment.  - Reviewed importance of checking blood sugar and administering glargine insulin about every 24 hours, he has agreed to check blood sugars at least twice a day and his daughter will send me his readings in 2 weeks. He was given written and verbal instructions regarding increasing dose by 2 units if blood sugar is greater than 200 and to let me know if any readings less than 70.  - Ambulatory referral to Cardiology - Follow up in 4 weeks  2. S/P CABG x 3 - Ambulatory referral to Cardiology  3. Incontinence of feces, unspecified fecal incontinence type - Has decreased significantly, will further assess at appointment for PE and refer for screening colonoscopy  4. Tobacco abuse disorder - he is aware he needs to quit again, will continue discussion. He is currently making major changes to diet and it may be unrealistic to have him do too much at once.   - follow up DM in 4 weeks, schedule CPE for 2 months.   Clarene Reamer, FNP-BC  [facility] at [facility], [facility]  01/13/2018 9:14 AM

## 2018-01-12 NOTE — Patient Instructions (Addendum)
Please continue to check you blood sugar several times a day. Always check before taking your insulin.  You are currently taking 20 units, please check your blood sugar and if over 200, increase your insulin by 2 units.  If you get to 40 units, please let me know. If you are having low blood sugar less than 70, please let me know.   Please send me your blood sugar readings in 2 weeks  Follow up in 4 weeks, sooner if blood sugars over 200 more than half the time  Make an appointment for complete physical in 3 months

## 2018-01-25 ENCOUNTER — Telehealth: Payer: Self-pay | Admitting: Internal Medicine

## 2018-01-25 NOTE — Telephone Encounter (Signed)
Fine with me.  -Mali

## 2018-01-25 NOTE — Telephone Encounter (Signed)
That is fine with me.

## 2018-01-25 NOTE — Telephone Encounter (Signed)
Patient moved and wants to see a [employer] provider .  Is this ok to change providers?

## 2018-01-25 NOTE — Telephone Encounter (Signed)
lmov to schedule appt with Dr. Saunders Revel

## 2018-01-27 DIAGNOSIS — F17200 Nicotine dependence, unspecified, uncomplicated: Secondary | ICD-10-CM | POA: Insufficient documentation

## 2018-01-28 ENCOUNTER — Encounter: Payer: Self-pay | Admitting: Family Medicine

## 2018-01-28 NOTE — Telephone Encounter (Signed)
lmov to schedule appt  °

## 2018-02-04 ENCOUNTER — Other Ambulatory Visit: Payer: Self-pay | Admitting: Student

## 2018-02-04 NOTE — Telephone Encounter (Signed)
REFILL 

## 2018-02-07 ENCOUNTER — Telehealth: Payer: Self-pay | Admitting: Family Medicine

## 2018-02-07 NOTE — Telephone Encounter (Unsigned)
Copied from [place] [phone]. Topic: Quick Communication - See Telephone Encounter >> Feb 07, 2018  1:40 PM Hewitt Shorts wrote: Pt daughter is calling to see if a rx for glipizide in a 90 day supply can be called in  [facility] [address] in [place] number [phone]

## 2018-02-08 NOTE — Telephone Encounter (Signed)
Pt's daughter calling to see if pt could have a rx for a 90 day supply of Glipizide 10mg  tab.   Last refilled on 01/05/18 #30  LOV:01/12/18  PCP: Karlyn Agee  [facility] [address]    Odis Hollingshead

## 2018-02-14 ENCOUNTER — Ambulatory Visit: Payer: BLUE CROSS/BLUE SHIELD | Admitting: Family Medicine

## 2018-02-21 ENCOUNTER — Encounter: Payer: Self-pay | Admitting: Family Medicine

## 2018-02-21 ENCOUNTER — Ambulatory Visit (INDEPENDENT_AMBULATORY_CARE_PROVIDER_SITE_OTHER): Payer: BLUE CROSS/BLUE SHIELD | Admitting: Family Medicine

## 2018-02-21 VITALS — BP 120/76 | HR 92 | Temp 98.6°F | Ht 64.75 in | Wt 171.5 lb

## 2018-02-21 DIAGNOSIS — E1165 Type 2 diabetes mellitus with hyperglycemia: Secondary | ICD-10-CM

## 2018-02-21 DIAGNOSIS — R159 Full incontinence of feces: Secondary | ICD-10-CM | POA: Diagnosis not present

## 2018-02-21 DIAGNOSIS — R142 Eructation: Secondary | ICD-10-CM

## 2018-02-21 DIAGNOSIS — Z720 Tobacco use: Secondary | ICD-10-CM

## 2018-02-21 DIAGNOSIS — R2689 Other abnormalities of gait and mobility: Secondary | ICD-10-CM

## 2018-02-21 NOTE — Patient Instructions (Addendum)
Good to see you today  I have put in a referral to see a gastroenterologist- will work you up for gastroparesis. Stop at the front desk.   Can reschedule complete physical for August if you want

## 2018-02-21 NOTE — Progress Notes (Signed)
Subjective:    Patient ID: [name], male    DOB: [birth year], 57 y.o.   MRN: [id number]  HPI This is a 57 yo male who presents today for follow up of DM type 2, CAD, tobacco abuse.   DM type 2- he was seen by endocrinology Drema Halon, NP) 01/20/18. Invokana was stopped, glipizide 10 mg bid continued, Basaglar increased to 40 units daily, added Jardiance 25 mg daily and Victoza was added and titrated up (currently on 1.2). Has been checking blood sugar sporadically (is waiting on strips). Decreased urinary frequency. Has switched to diet soda.   CAD- has appointment scheduled with cardiology. Denies CP, SOB.   Tobacco abuse- continues to smoke. Has had some work stress lately and not able to consider quitting at this time.   Bowel movements- has had a few more episodes of night time bowel incontinence. Is usually pre ceeded by foul belching. He is  concerned for gastroparesis after his daughter found this on the internet and it seemed to match his symptoms. He has never had a screening colonoscopy.   Balance- still feels wobbly. No recent falls. Legs feel weak. Feels like his balance is poor. Seems to have started after heart attack. A little better recently.    Past Medical History:  Diagnosis Date  . Abrasion L FOREARM  . Atrial fibrillation ([place])    a. initially occurring in post-op setting in 05/2016, started on Eliquis b. 08/2016: s/p clipping of atrial appendage at time of CABG --> Eliquis switched to Coumadin  . CAD (coronary artery disease)    a. 05/2016: NSTEMI, cath showing 3v disease with CABG recommended once MRSA infection resolved. b. 08/2016: CABG with LIMA-LAD, SVG-D1, and SVG-RI.  Marland Kitchen Cancer ([place])    basal cell carcinoma of the skin  . Diabetes mellitus without complication ([place])   . Headache(784.0)   . Hematuria, microscopic "SINCE I WAS A KID"  . Hyperlipidemia   . Hypertension   . Ischemic cardiomyopathy    a. 05/2016: echo w/ EF of 25-35% and  akinesis of the basal-midinferolateral and inferior myocardium.  . Lactose intolerance   . Myocardial infarction ([place])   . Respiratory failure ([place])   . Rotator cuff tear RIGHT  . Tobacco use    a. quit in 05/2016 following MI   Past Surgical History:  Procedure Laterality Date  . CARDIAC CATHETERIZATION N/A 06/16/2016   Procedure: Left Heart Cath and Coronary Angiography;  Surgeon: Troy Sine, MD;  Location: [facility];  Service: Cardiovascular;  Laterality: N/A;  . CLIPPING OF ATRIAL APPENDAGE N/A 08/24/2016   Procedure: CLIPPING OF ATRIAL APPENDAGE;  Surgeon: Ivin Poot, MD;  Location: [facility];  Service: Open Heart Surgery;  Laterality: N/A;  . CORONARY ARTERY BYPASS GRAFT N/A 08/24/2016   Procedure: CORONARY ARTERY BYPASS GRAFTING (CABG) x three,  using left internal mammary artery and right leg greater saphenous vein harvested endoscopically;  Surgeon: Ivin Poot, MD;  Location: [facility];  Service: Open Heart Surgery;  Laterality: N/A;  . HERNIA REPAIR  X2  . INCISION AND DRAINAGE ABSCESS Left 06/10/2016   Procedure: INCISION AND DRAINAGE LEFT AXILLARY ABSCESS;  Surgeon: Autumn Messing III, MD;  [facility];  Service: General;  Laterality: Left;  . ROTATOR CUFF REPAIR Bilateral   . TEE WITHOUT CARDIOVERSION N/A 08/24/2016   Procedure: TRANSESOPHAGEAL ECHOCARDIOGRAM (TEE);  Surgeon: Ivin Poot, MD;  Location: [facility];  Service: Open Heart Surgery;  Laterality: N/A;  Family History  Problem Relation Age of Onset  . Hypertension Father   . Sudden death Father   . Diabetes Father   . Heart attack Father   . Hypertension Mother   . Heart attack Sister    Social History   Tobacco Use  . Smoking status: Current Every Day Smoker    Packs/day: 0.50    Types: Cigarettes  . Smokeless tobacco: Never Used  Substance Use Topics  . Alcohol use: No  . Drug use: No      Review of Systems Per HPI    Objective:   Physical Exam  Constitutional: He is oriented to  person, place, and time. He appears well-nourished. No distress.  HENT:  Head: Normocephalic and atraumatic.  Eyes: Pupils are equal, round, and reactive to light. Conjunctivae and EOM are normal.  Neck: Normal range of motion.  Cardiovascular: Normal rate, regular rhythm, normal heart sounds and intact distal pulses.  Pulmonary/Chest: Effort normal and breath sounds normal.  Abdominal: Soft. Bowel sounds are normal. He exhibits no distension and no mass. There is no tenderness. There is no rebound and no guarding.  Musculoskeletal: He exhibits no edema.  Neurological: He is alert and oriented to person, place, and time. No cranial nerve deficit. He exhibits normal muscle tone. He displays a negative Romberg sign. Coordination and gait normal.  Reflex Scores:      Bicep reflexes are 2+ on the right side and 2+ on the left side.      Brachioradialis reflexes are 2+ on the right side and 2+ on the left side.      Patellar reflexes are 1+ on the right side and 1+ on the left side. A little difficulty with heel to toe walking.   Skin: Skin is warm and dry. He is not diaphoretic.  Psychiatric: He has a normal mood and affect. His behavior is normal. Judgment and thought content normal.  Vitals reviewed.     BP 120/76 (BP Location: Right Arm, Patient Position: Sitting, Cuff Size: Normal)   Pulse 92   Temp 98.6 F (37 C) (Oral)   Ht 5' 4.75" (1.645 m)   Wt 171 lb 8 oz (77.8 kg)   SpO2 98%   BMI 28.76 kg/m  Wt Readings from Last 3 Encounters:  02/21/18 171 lb 8 oz (77.8 kg)  01/12/18 172 lb 8 oz (78.2 kg)  01/05/18 171 lb 8 oz (77.8 kg)       Assessment & Plan:  1. Full incontinence of feces - ongoing problem that is worsening - Ambulatory referral to Gastroenterology  2. Belching - Ambulatory referral to Gastroenterology  3. Uncontrolled type 2 diabetes mellitus with hyperglycemia ([place]) - follow up with endocrine as scheduled (later this week), discussed importance of checking  blood sugars regularly and he will let me know if he needs any assistance getting new test strips - Ambulatory referral to Gastroenterology  4. Tobacco abuse disorder - he is unable to work on cessation at this time  5. Poor balance - no focal neurological abnormalities - seems to be improving, hopefully will continue to improve with better blood sugar control  - follow up in 6 months for CPE  Clarene Reamer, FNP-BC  [facility]  02/21/2018 1:58 PM

## 2018-02-24 ENCOUNTER — Other Ambulatory Visit: Payer: Self-pay

## 2018-02-24 ENCOUNTER — Encounter: Payer: Self-pay | Admitting: Gastroenterology

## 2018-02-24 ENCOUNTER — Ambulatory Visit: Payer: BLUE CROSS/BLUE SHIELD | Admitting: Gastroenterology

## 2018-02-24 VITALS — BP 120/83 | HR 99 | Temp 97.9°F | Ht 67.4 in | Wt 170.2 lb

## 2018-02-24 DIAGNOSIS — K219 Gastro-esophageal reflux disease without esophagitis: Secondary | ICD-10-CM

## 2018-02-24 DIAGNOSIS — R748 Abnormal levels of other serum enzymes: Secondary | ICD-10-CM

## 2018-02-24 DIAGNOSIS — R197 Diarrhea, unspecified: Secondary | ICD-10-CM | POA: Diagnosis not present

## 2018-02-24 DIAGNOSIS — D696 Thrombocytopenia, unspecified: Secondary | ICD-10-CM

## 2018-02-24 MED ORDER — RANITIDINE HCL 75 MG PO TABS: 75.0000 mg | ORAL_TABLET | Freq: Two times a day (BID) | ORAL | 2 refills | Status: DC

## 2018-02-24 NOTE — Patient Instructions (Signed)
F/u 3 months   Gastroesophageal Reflux Disease, Adult Normally, food travels down the esophagus and stays in the stomach to be digested. If a person has gastroesophageal reflux disease (GERD), food and stomach acid move back up into the esophagus. When this happens, the esophagus becomes sore and swollen (inflamed). Over time, GERD can make small holes (ulcers) in the lining of the esophagus. Follow these instructions at home: Diet  Follow a diet as told by your doctor. You may need to avoid foods and drinks such as: ? Coffee and tea (with or without caffeine). ? Drinks that contain alcohol. ? Energy drinks and sports drinks. ? Carbonated drinks or sodas. ? Chocolate and cocoa. ? Peppermint and mint flavorings. ? Garlic and onions. ? Horseradish. ? Spicy and acidic foods, such as peppers, chili powder, curry powder, vinegar, hot sauces, and BBQ sauce. ? Citrus fruit juices and citrus fruits, such as oranges, lemons, and limes. ? Tomato-based foods, such as red sauce, chili, salsa, and pizza with red sauce. ? Fried and fatty foods, such as donuts, french fries, potato chips, and high-fat dressings. ? High-fat meats, such as hot dogs, rib eye steak, sausage, ham, and bacon. ? High-fat dairy items, such as whole milk, butter, and cream cheese.  Eat small meals often. Avoid eating large meals.  Avoid drinking large amounts of liquid with your meals.  Avoid eating meals during the 2-3 hours before bedtime.  Avoid lying down right after you eat.  Do not exercise right after you eat. General instructions  Pay attention to any changes in your symptoms.  Take over-the-counter and prescription medicines only as told by your doctor. Do not take aspirin, ibuprofen, or other NSAIDs unless your doctor says it is okay.  Do not use any tobacco products, including cigarettes, chewing tobacco, and e-cigarettes. If you need help quitting, ask your doctor.  Wear loose clothes. Do not wear  anything tight around your waist.  Raise (elevate) the head of your bed about 6 inches (15 cm).  Try to lower your stress. If you need help doing this, ask your doctor.  If you are overweight, lose an amount of weight that is healthy for you. Ask your doctor about a safe weight loss goal.  Keep all follow-up visits as told by your doctor. This is important. Contact a doctor if:  You have new symptoms.  You lose weight and you do not know why it is happening.  You have trouble swallowing, or it hurts to swallow.  You have wheezing or a cough that keeps happening.  Your symptoms do not get better with treatment.  You have a hoarse voice. Get help right away if:  You have pain in your arms, neck, jaw, teeth, or back.  You feel sweaty, dizzy, or light-headed.  You have chest pain or shortness of breath.  You throw up (vomit) and your throw up looks like blood or coffee grounds.  You pass out (faint).  Your poop (stool) is bloody or black.  You cannot swallow, drink, or eat. This information is not intended to replace advice given to you by your health care provider. Make sure you discuss any questions you have with your health care provider. Document Released: 03/23/2008 Document Revised: 03/12/2016 Document Reviewed: 01/30/2015 Elsevier Interactive Patient Education  [employer].

## 2018-02-24 NOTE — Progress Notes (Signed)
[name] [address]  [place]  [place], [place] [address]  Main: [phone]  Fax: [phone]   Gastroenterology Consultation  Referring Provider:     Elby Beck, [name] Primary Care Physician:  Derrick Beck, [name] Primary Gastroenterologist:  Dr. Vonda [name] Reason for Consultation:     Diarrhea, belching  Chief Complaint  Patient presents with  . Establish Care    full incontinence of stool-referral from D. Carlean Purl, [name]          HPI:   [name] is a 57 y.o. y/o male referred for consultation & management  by Dr. Carlean [name], Dalbert Batman, [name].   Patient reports 21-monthhistory of intermittent bulging, associated with bad taste in mouth.  Episodes occur multiple times a day, over 2-3 times a day, unrelated to meals.  They may occur 6 days in a row, and then he might not have another episode for another month, or they might occur once a week.  Denies any dysphasia, denies any hematemesis.  Denies any abdominal pain.  Denies any weight loss.  Also reports intermittent loose stools and diarrhea.  Reports bowel movements may be 2-3 loose stools a day, or 10 loose stool a day.  No blood in stool.  Last episode of incontinence was a month ago, when he was sleeping.  During the day, he does not have any incontinence, as he is awake and is able to feel a bowel movement, and, and make it to the bathroom.  He states after his heart surgery about a year and a half ago, he had more frequent episodes of incontinence, but that has improved significantly since then.  He has never had a colonoscopy or EGD.  He has never been on any acid reducers.  Blood work shows very elevated blood sugars recently, with hemoglobin A1c of 14 and currently undergoing changes in his diabetic medications.  Past Medical History:  Diagnosis Date  . Abrasion L FOREARM  . Atrial fibrillation ([place]    a. initially occurring in post-op setting in 05/2016, started on Eliquis b. 08/2016:  s/p clipping of atrial appendage at time of CABG --> Eliquis switched to Coumadin  . CAD (coronary artery disease)    a. 05/2016: NSTEMI, cath showing 3v disease with CABG recommended once MRSA infection resolved. b. 08/2016: CABG with LIMA-LAD, SVG-D1, and SVG-RI.  .Marland KitchenCancer ([place]    basal cell carcinoma of the skin  . Diabetes mellitus without complication ([place]   . Headache(784.0)   . Hematuria, microscopic "SINCE I WAS A KID"  . Hyperlipidemia   . Hypertension   . Ischemic cardiomyopathy    a. 05/2016: echo w/ EF of 25-35% and akinesis of the basal-midinferolateral and inferior myocardium.  . Lactose intolerance   . Myocardial infarction ([place]   . Respiratory failure ([place]   . Rotator cuff tear RIGHT  . Tobacco use    a. quit in 05/2016 following MI    Past Surgical History:  Procedure Laterality Date  . CARDIAC CATHETERIZATION N/A 06/16/2016   Procedure: Left Heart Cath and Coronary Angiography;  Surgeon: TTroy Sine MD;  [facility];  Service: Cardiovascular;  Laterality: N/A;  . CLIPPING OF ATRIAL APPENDAGE N/A 08/24/2016   Procedure: CLIPPING OF ATRIAL APPENDAGE;  Surgeon: PIvin Poot MD;  [facility]  Service: Open Heart Surgery;  Laterality: N/A;  . CORONARY ARTERY BYPASS GRAFT N/A 08/24/2016   Procedure: CORONARY ARTERY BYPASS GRAFTING (CABG) x three,  using left internal mammary artery and right leg greater saphenous vein harvested endoscopically;  Surgeon: Ivin Poot, MD;  Location: [facility];  Service: Open Heart Surgery;  Laterality: N/A;  . HERNIA REPAIR  X2  . INCISION AND DRAINAGE ABSCESS Left 06/10/2016   Procedure: INCISION AND DRAINAGE LEFT AXILLARY ABSCESS;  Surgeon: Autumn Messing III, MD;  [facility];  Service: General;  Laterality: Left;  . ROTATOR CUFF REPAIR Bilateral   . TEE WITHOUT CARDIOVERSION N/A 08/24/2016   Procedure: TRANSESOPHAGEAL ECHOCARDIOGRAM (TEE);  Surgeon: Ivin Poot, MD;  Location: [facility];  Service: Open Heart  Surgery;  Laterality: N/A;    Prior to Admission medications   Medication Sig Start Date End Date Taking? Authorizing Provider  acetaminophen (TYLENOL) 500 MG tablet Take 1,000 mg by mouth every 6 (six) hours as needed for headache.   Yes [provider]  aspirin 81 MG chewable tablet Chew 1 tablet (81 mg total) by mouth daily. 06/19/16  Yes Rai, Ripudeep K, MD  atorvastatin (LIPITOR) 80 MG tablet Take 1 tablet (80 mg total) by mouth at bedtime. 10/14/17  Yes Nafziger, Tommi Rumps, NP  citalopram (CELEXA) 10 MG tablet Take 1 tablet (10 mg total) by mouth daily. 06/30/17  Yes Nafziger, Tommi Rumps, NP  empagliflozin (JARDIANCE) 25 MG TABS tablet Take by mouth. 01/20/18  Yes [provider]  glipiZIDE (GLUCOTROL) 10 MG tablet Take 1 tablet (10 mg total) by mouth 2 (two) times daily before a meal. **  NO FURTHER REFILLS UNTIL SEEN IN OFFICE ** 01/05/18  Yes Derrick Beck, [name]  glucose blood test strip Use as instructed 02/02/17  Yes Nafziger, Tommi Rumps, NP  Insulin Glargine (BASAGLAR KWIKPEN) 100 UNIT/ML SOPN Inject 0.05 mLs (5 Units total) into the skin at bedtime. Patient taking differently: Inject 20 Units into the skin at bedtime.  04/27/17  Yes Nafziger, Tommi Rumps, NP  Insulin Pen Needle 32G X 4 MM MISC Use to inject Insulin daily. Dx: E11.9 01/05/18  Yes Derrick Beck, [name]  Lancets [facility] ULTRASOFT) lancets Use as instructed 01/05/18  Yes Derrick Beck, [name]  liraglutide (VICTOZA) 18 MG/3ML SOPN Inject into the skin. 01/20/18 01/20/19 Yes [provider]  metoprolol succinate (TOPROL-XL) 100 MG 24 hr tablet Take by mouth.   Yes [provider]  metoprolol tartrate (LOPRESSOR) 25 MG tablet Take 1 tablet (25 mg total) by mouth 2 (two) times daily. KEEP OV. 02/04/18  Yes Hilty, Nadean Corwin, MD  pramipexole (MIRAPEX) 0.125 MG tablet TAKE 1 TABLET (0.125 MG TOTAL) BY MOUTH AT BEDTIME. Patient not taking: Reported on 02/24/2018 06/30/17   Tat, Eustace Quail, DO  ranitidine (ZANTAC 75) 75 MG  tablet Take 1 tablet (75 mg total) by mouth 2 (two) times daily. 02/24/18   Virgel Manifold, MD  Saccharomyces boulardii (PROBIOTIC) 250 MG CAPS Take 250 mg by mouth daily. Patient not taking: Reported on 02/24/2018 03/09/17   Landis Martins, DPM    Family History  Problem Relation Age of Onset  . Hypertension Father   . Sudden death Father   . Diabetes Father   . Heart attack Father   . Hypertension Mother   . Heart attack Sister      Social History   Tobacco Use  . Smoking status: Current Every Day Smoker    Packs/day: 0.50    Types: Cigarettes  . Smokeless tobacco: Never Used  Substance Use Topics  . Alcohol use: No  . Drug use: No    Allergies as  of 02/24/2018 - Review Complete 02/24/2018  Allergen Reaction Noted  . Latex Rash 06/16/2016    Review of Systems:    All systems reviewed and negative except where noted in HPI.   Physical Exam:  BP 120/83   Pulse 99   Temp 97.9 F (36.6 C) (Oral)   Ht 5' 7.4" (1.712 m)   Wt 170 lb 3.2 oz (77.2 kg)   BMI 26.34 kg/m  No LMP for male patient. Psych:  Alert and cooperative. Normal mood and affect. General:   Alert,  Well-developed, well-nourished, pleasant and cooperative in NAD Head:  Normocephalic and atraumatic. Eyes:  Sclera clear, no icterus.   Conjunctiva pink. Ears:  Normal auditory acuity. Nose:  No deformity, discharge, or lesions. Mouth:  No deformity or lesions,oropharynx pink & moist. Neck:  Supple; no masses or thyromegaly. Lungs:  Respirations even and unlabored.  Clear throughout to auscultation.   No wheezes, crackles, or rhonchi. No acute distress. Heart:  Regular rate and rhythm; no murmurs, clicks, rubs, or gallops. Abdomen:  Normal bowel sounds.  No bruits.  Soft, non-tender and non-distended without masses, hepatosplenomegaly or hernias noted.  No guarding or rebound tenderness.    Msk:  Symmetrical without gross deformities. Good, equal movement & strength bilaterally. Pulses:  Normal pulses  noted. Extremities:  No clubbing or edema.  No cyanosis. Neurologic:  Alert and oriented x3;  grossly normal neurologically. Skin:  Intact without significant lesions or rashes. No jaundice. Lymph Nodes:  No significant cervical adenopathy. Psych:  Alert and cooperative. Normal mood and affect.   Labs: CBC    Component Value Date/Time   WBC 7.5 01/05/2018 1207   RBC 5.07 01/05/2018 1207   HGB 14.5 01/05/2018 1207   HCT 43.1 01/05/2018 1207   PLT 118.0 (L) 01/05/2018 1207   MCV 85.0 01/05/2018 1207   MCH 25.6 (L) 01/24/2017 0446   MCHC 33.7 01/05/2018 1207   RDW 13.9 01/05/2018 1207   LYMPHSABS 2.2 01/05/2018 1207   MONOABS 0.9 01/05/2018 1207   EOSABS 0.2 01/05/2018 1207   BASOSABS 0.0 01/05/2018 1207   CMP     Component Value Date/Time   NA 132 (L) 01/05/2018 1207   NA 146 03/08/2017   K 4.8 01/05/2018 1207   CL 97 01/05/2018 1207   CO2 25 01/05/2018 1207   GLUCOSE 600 (HH) 01/05/2018 1207   BUN 32 (H) 01/05/2018 1207   BUN 14 03/08/2017   CREATININE 1.36 01/05/2018 1207   CREATININE 1.18 09/21/2016 1605   CALCIUM 9.2 01/05/2018 1207   PROT 7.1 01/05/2018 1207   ALBUMIN 4.2 01/05/2018 1207   AST 28 01/05/2018 1207   ALT 31 01/05/2018 1207   ALKPHOS 150 (H) 01/05/2018 1207   BILITOT 0.3 01/05/2018 1207   GFRNONAA >60 01/26/2017 1331   GFRAA >60 01/26/2017 1331    Imaging Studies: No results found.  Assessment and Plan:   ROSWELL NDIAYE is a 57 y.o. y/o male has been referred for diarrhea, and belching with bad taste in mouth  Patient symptoms are most consistent with acid reflux causing bad taste in mouth He may have an underlying hiatal hernia, causing frequent belching Patient educated extensively on acid reflux lifestyle modification, including buying a bed wedge, not eating 3 hrs before bedtime, diet modifications, and handout given for the same.  We will also start Zantac twice daily We will avoid PPI at this time due to his diarrhea, and also PPI  association with C. Difficile If symptoms do  not improve, can consider PPI in the future, versus EGD  His diarrhea may be related to autonomic symptoms related to his uncontrolled diabetes This was discussed with him, and better blood sugar control was encouraged Patient was asked to avoid things like calm, hard candy, sugars We will also obtain stool testing, including fecal calprotectin, infectious testing Given his diabetes, he may have exocrine pancreatic insufficiency, will also test pancreatic elastase If stool testing is negative for infectious work-up, can then proceed with colonoscopy, since he has never had a screening exam before.  His last CMP showed elevated alk phos We will recheck alk phos and other liver enzymes No previous liver imaging available No previous heavy alcohol use, or previous history of cirrhosis.  No clinical evidence of cirrhosis at this time Platelets are chronically low.  After above work-up is complete, can consider further work-up or referral to hematology for thrombocytopenia  Dr [name]

## 2018-02-25 ENCOUNTER — Other Ambulatory Visit: Payer: Self-pay | Admitting: Adult Health

## 2018-02-25 NOTE — Telephone Encounter (Signed)
Denied.  Now has new PCP.

## 2018-03-03 ENCOUNTER — Other Ambulatory Visit
Admission: RE | Admit: 2018-03-03 | Discharge: 2018-03-03 | Disposition: A | Discharge status: Home / Self Care | Payer: BLUE CROSS/BLUE SHIELD | Source: Ambulatory Visit | Attending: Gastroenterology | Admitting: Gastroenterology

## 2018-03-03 DIAGNOSIS — R197 Diarrhea, unspecified: Secondary | ICD-10-CM

## 2018-03-03 DIAGNOSIS — R748 Abnormal levels of other serum enzymes: Secondary | ICD-10-CM | POA: Insufficient documentation

## 2018-03-03 DIAGNOSIS — D696 Thrombocytopenia, unspecified: Secondary | ICD-10-CM | POA: Diagnosis present

## 2018-03-03 LAB — GASTROINTESTINAL PANEL BY PCR, STOOL (REPLACES STOOL CULTURE)

## 2018-03-03 LAB — ALKALINE PHOSPHATASE: Alkaline Phosphatase: 126 U/L (ref 38–126)

## 2018-03-03 LAB — AST: AST: 30 U/L (ref 15–41)

## 2018-03-03 LAB — BILIRUBIN, TOTAL: BILIRUBIN TOTAL: 0.6 mg/dL (ref 0.3–1.2)

## 2018-03-03 LAB — C DIFFICILE QUICK SCREEN W PCR REFLEX
C DIFFICILE (CDIFF) TOXIN: NEGATIVE
C DIFFICLE (CDIFF) ANTIGEN: NEGATIVE
C Diff interpretation: NOT DETECTED

## 2018-03-03 LAB — GAMMA GT: GGT: 18 U/L (ref 7–50)

## 2018-03-03 LAB — C-REACTIVE PROTEIN

## 2018-03-03 LAB — ALBUMIN: ALBUMIN: 4 g/dL (ref 3.5–5.0)

## 2018-03-03 LAB — PROTIME-INR
INR: 0.99
Prothrombin Time: 13 seconds (ref 11.4–15.2)

## 2018-03-03 LAB — ALT: ALT: 31 U/L (ref 17–63)

## 2018-03-04 LAB — PANCREATIC ELASTASE, FECAL: Pancreatic Elastase-1, Stool: 474 ug Elast./g (ref >200)

## 2018-03-06 IMAGING — CR DG CHEST 2V
2 series · 2 of 2 positions shown · non-contrast
Comparison: 06/18/2016 .  CT 06/11/2016

CLINICAL DATA: CABG.

EXAM:
CHEST  2 VIEW

[w chest pa]
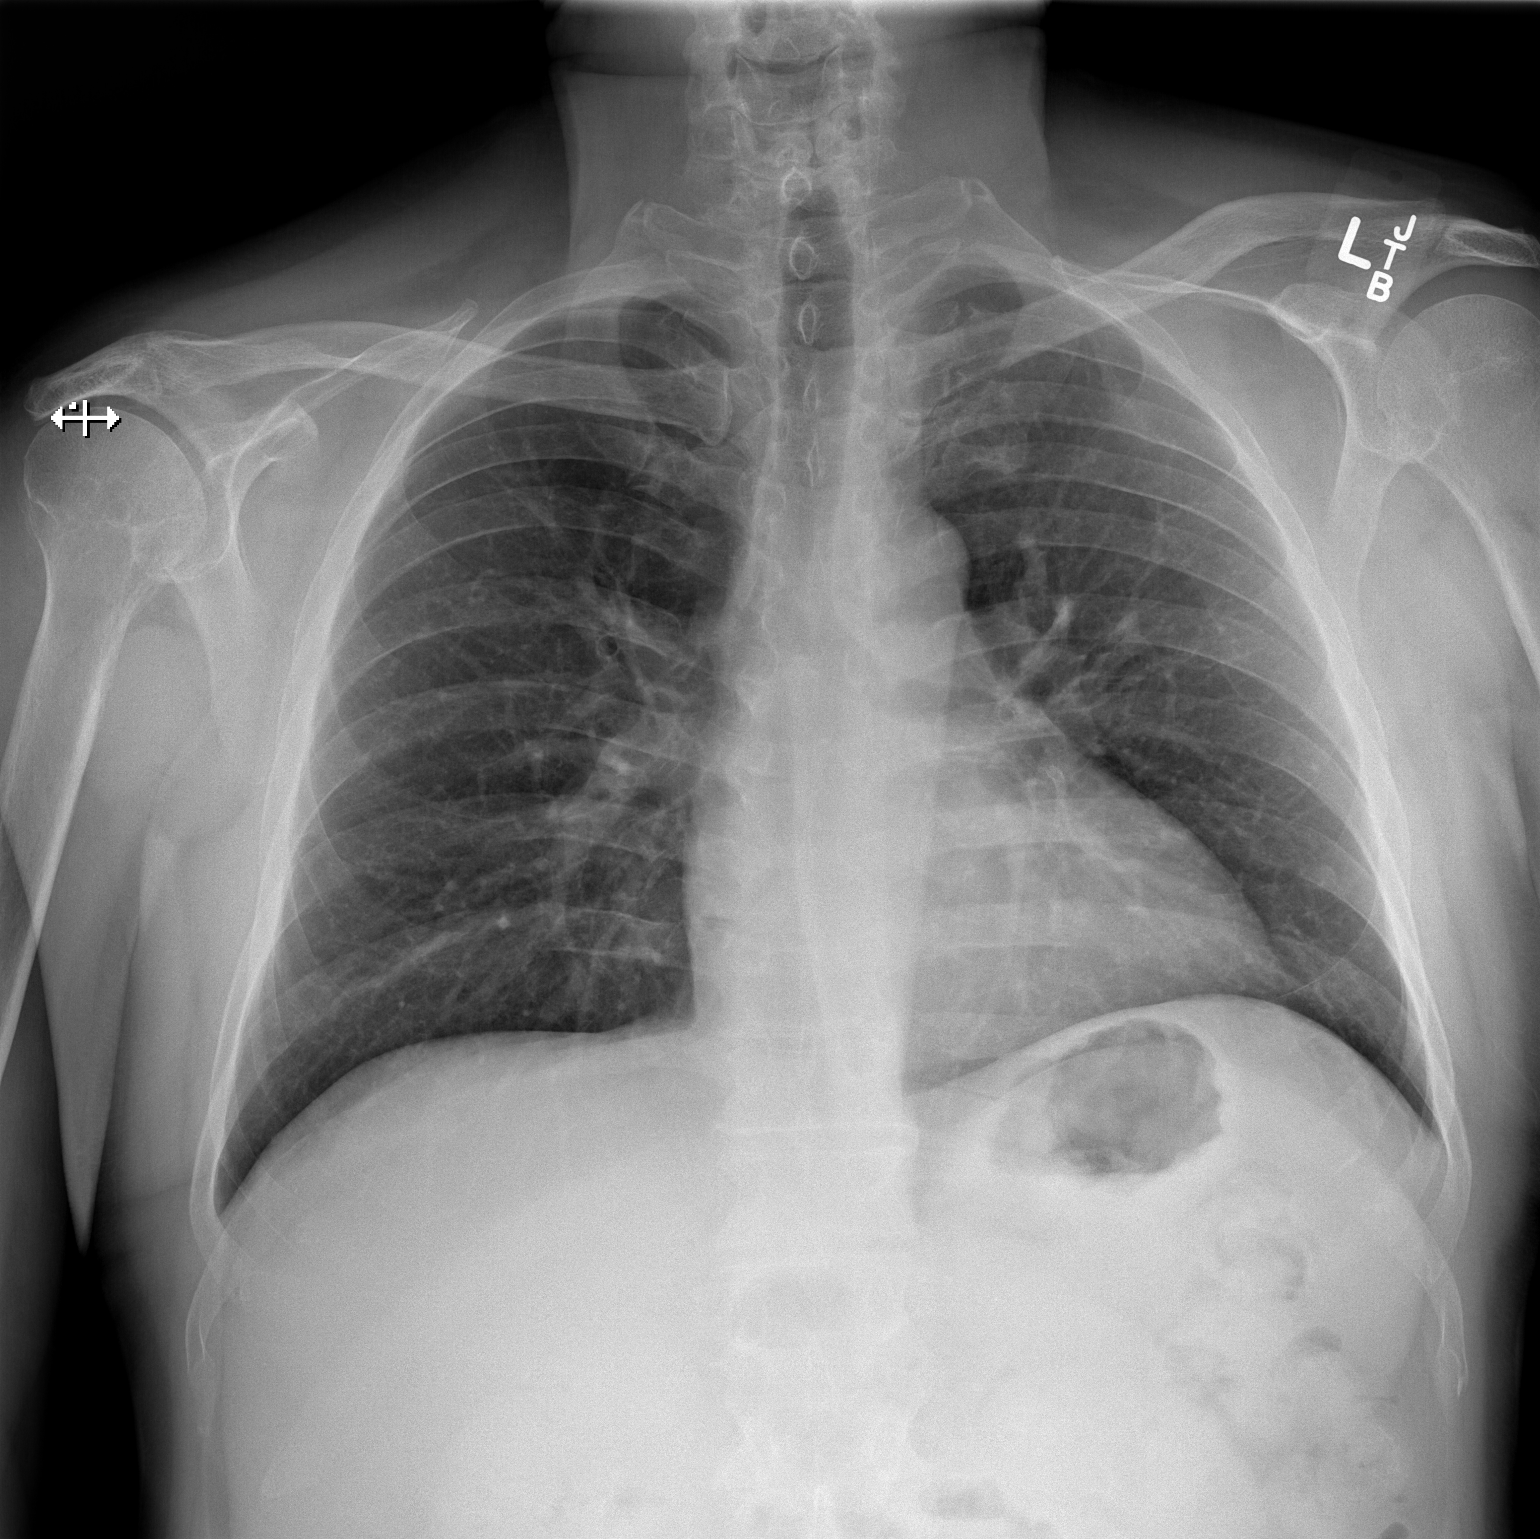

[w chest lat]
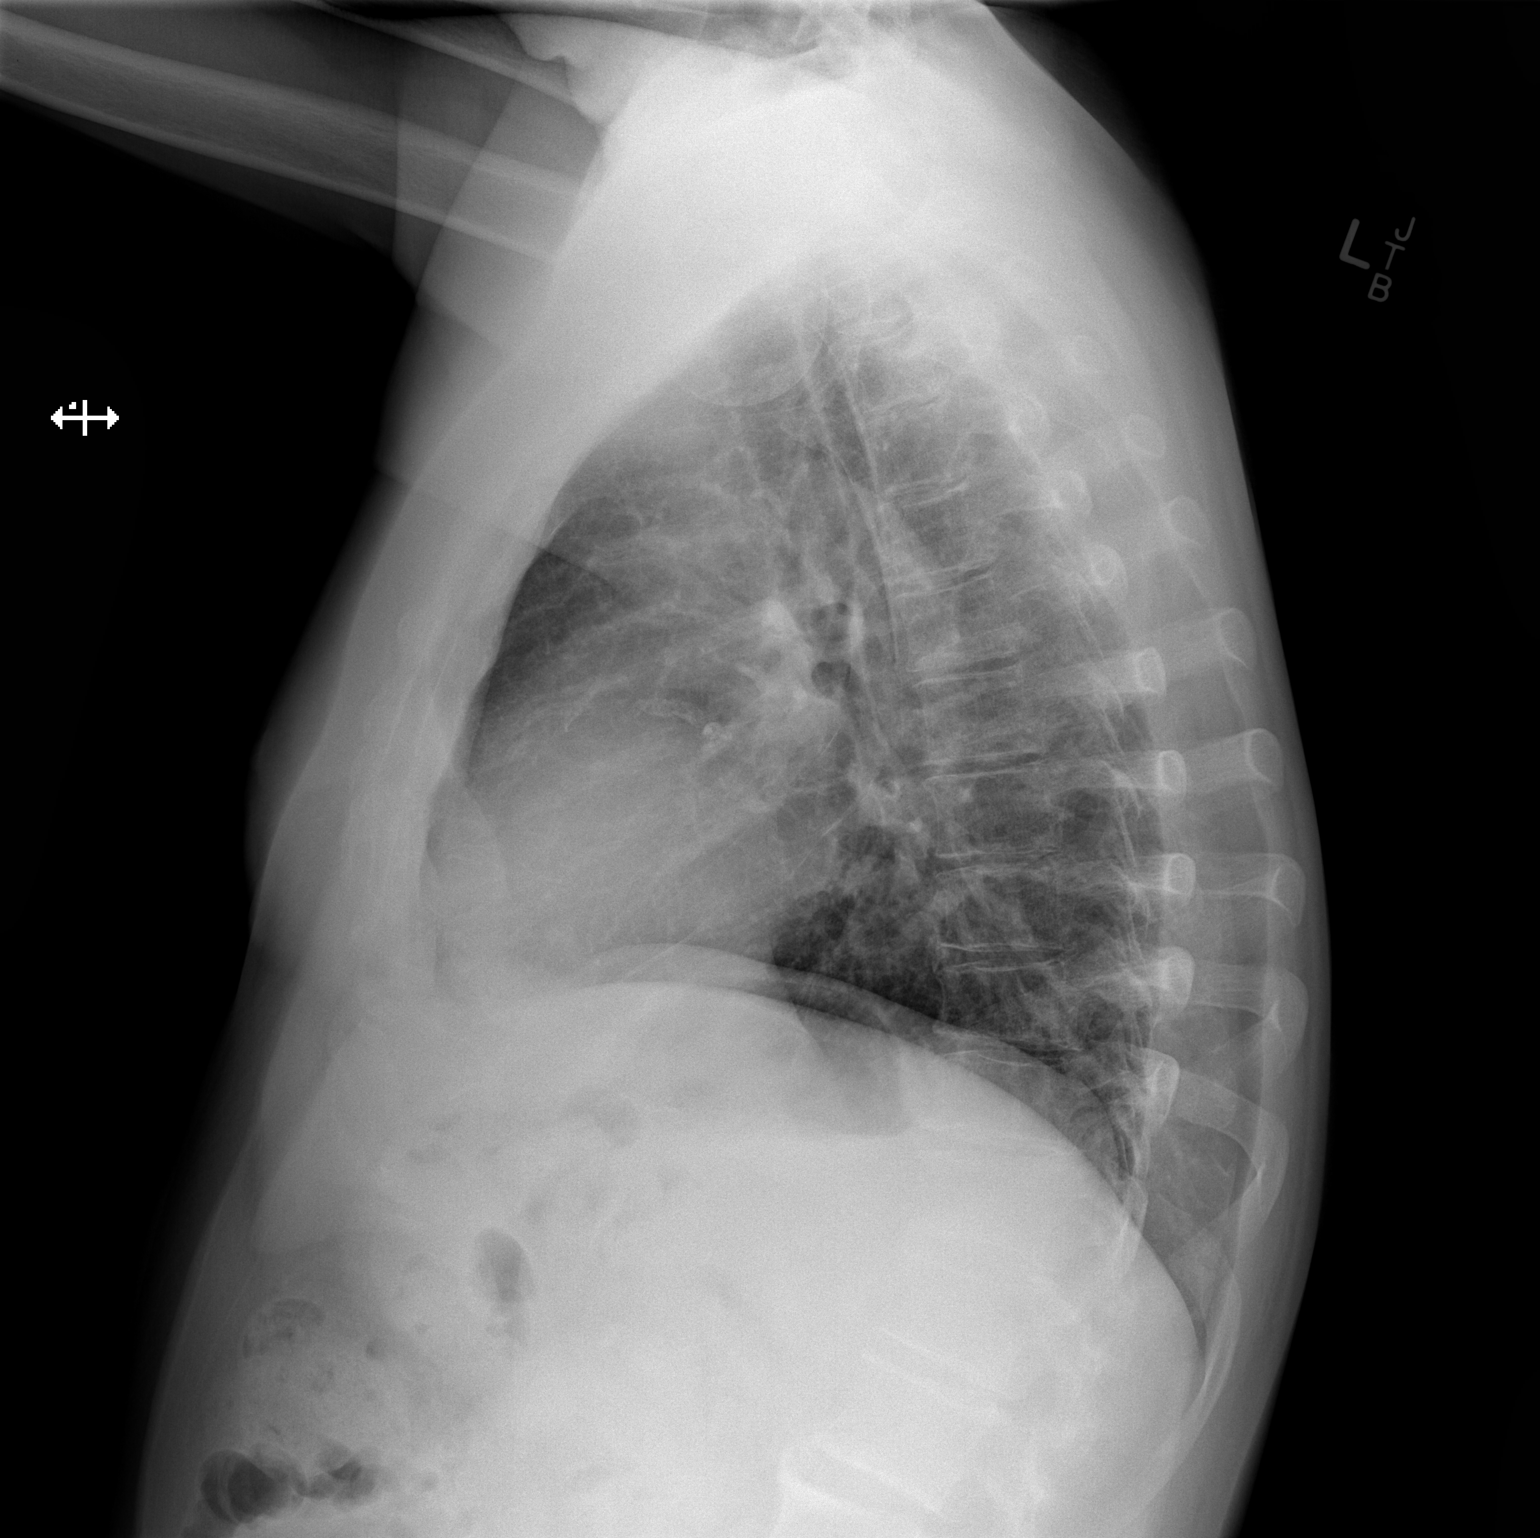

[2 of 2 positions shown; findings below may reference images not displayed]

FINDINGS: Mediastinum hilar structures normal. Heart size stable. No evidence
congestive heart failure. Previously identified pulmonary edema on
06/18/2016 is clear. Symmetric faint nodular opacities over both
lung bases most likely nipple shadows. No pleural effusion or
pneumothorax. No acute bony abnormality .
IMPRESSION: No acute cardiopulmonary disease. Previously identified pulmonary
edema noted on 06/18/2016 has cleared.

## 2018-03-07 LAB — CALPROTECTIN, FECAL: CALPROTECTIN, FECAL: 21 ug/g (ref 0–120)

## 2018-03-08 ENCOUNTER — Other Ambulatory Visit: Payer: Self-pay | Admitting: Family Medicine

## 2018-03-08 DIAGNOSIS — F339 Major depressive disorder, recurrent, unspecified: Secondary | ICD-10-CM

## 2018-03-08 NOTE — Telephone Encounter (Signed)
Copied from [place] #[id number]. Topic: Quick Communication - Rx Refill/Question >> Mar 08, 2018  1:40 PM Wynetta Emery, [place] C wrote: Medication: citalopram (CELEXA) 10 MG tablet  Has the patient contacted their pharmacy? No  (Agent: If no, request that the patient contact the pharmacy for the refill.) (Agent: If yes, when and what did the pharmacy advise?)  Preferred Pharmacy (with phone number or street name): [facility] on [address]: Please be advised that RX refills may take up to 3 business days. We ask that you follow-up with your pharmacy.

## 2018-03-09 ENCOUNTER — Telehealth: Payer: Self-pay

## 2018-03-09 MED ORDER — CITALOPRAM HYDROBROMIDE 10 MG PO TABS: 10.0000 mg | ORAL_TABLET | Freq: Every day | ORAL | 1 refills | Status: DC

## 2018-03-09 NOTE — Telephone Encounter (Signed)
Attempted to contact [name].   Issue with the phone connection. No ringtone or answer.   Unable to leave a message.

## 2018-03-09 NOTE — Telephone Encounter (Signed)
Req. refill on Celexa; last refill 06/30/17; #90; refill x 1 (by [name], NP) Last office visit 02/21/18 PCP: [place]: [facility] [address] ,  [place]

## 2018-03-10 ENCOUNTER — Telehealth: Payer: Self-pay

## 2018-03-10 NOTE — Telephone Encounter (Signed)
LVM for patient callback for results per Dr. Darene Lamer.   - stool and lab testing was normal. If patient is agreeable, please schedule him for a colonoscopy for screening, and an EGD for belching/acid reflux

## 2018-03-11 ENCOUNTER — Telehealth: Payer: Self-pay

## 2018-03-11 NOTE — Telephone Encounter (Signed)
Attempted call x3 for results.  No answer

## 2018-04-07 ENCOUNTER — Ambulatory Visit: Payer: BLUE CROSS/BLUE SHIELD | Admitting: Gastroenterology

## 2018-04-08 ENCOUNTER — Telehealth: Payer: Self-pay | Admitting: Family Medicine

## 2018-04-08 ENCOUNTER — Other Ambulatory Visit: Payer: Self-pay

## 2018-04-08 MED ORDER — ATORVASTATIN CALCIUM 80 MG PO TABS: 80.0000 mg | ORAL_TABLET | Freq: Every day | ORAL | 0 refills | Status: DC

## 2018-04-08 NOTE — Telephone Encounter (Signed)
Copied from [place] [id number]. Topic: Quick Communication - Rx Refill/Question >> Apr 08, 2018 10:59 AM Synthia Innocent wrote: Medication: atorvastatin (LIPITOR) 80 MG tablet   Has the patient contacted their pharmacy? Yes.   (Agent: If no, request that the patient contact the pharmacy for the refill.) (Agent: If yes, when and what did the pharmacy advise?)  Preferred Pharmacy (with phone number or street name): [facility] on [address]  Agent: Please be advised that RX refills may take up to 3 business days. We ask that you follow-up with your pharmacy.

## 2018-04-15 ENCOUNTER — Encounter: Payer: BLUE CROSS/BLUE SHIELD | Admitting: Family Medicine

## 2018-04-20 ENCOUNTER — Ambulatory Visit: Payer: Self-pay | Admitting: Internal Medicine

## 2018-04-20 VITALS — BP 124/80 | HR 100 | Ht 65.0 in | Wt 174.2 lb

## 2018-04-20 DIAGNOSIS — Z720 Tobacco use: Secondary | ICD-10-CM

## 2018-04-20 DIAGNOSIS — I10 Essential (primary) hypertension: Secondary | ICD-10-CM

## 2018-04-20 DIAGNOSIS — E1159 Type 2 diabetes mellitus with other circulatory complications: Secondary | ICD-10-CM

## 2018-04-20 DIAGNOSIS — E785 Hyperlipidemia, unspecified: Secondary | ICD-10-CM

## 2018-04-20 DIAGNOSIS — I25119 Atherosclerotic heart disease of native coronary artery with unspecified angina pectoris: Secondary | ICD-10-CM

## 2018-04-20 DIAGNOSIS — I255 Ischemic cardiomyopathy: Secondary | ICD-10-CM

## 2018-04-20 DIAGNOSIS — Z794 Long term (current) use of insulin: Secondary | ICD-10-CM

## 2018-04-20 MED ORDER — NITROGLYCERIN 0.4 MG SL SUBL: 0.4000 mg | SUBLINGUAL_TABLET | SUBLINGUAL | 3 refills | Status: DC | PRN

## 2018-04-20 NOTE — Progress Notes (Signed)
Follow-up Outpatient Visit Date: 04/20/2018  Primary Care Provider: Elby Beck, [place] [address]  Chief Complaint: Follow-up coronary artery disease  HPI:  [name] is a 57 y.o. year-old male with history of coronary artery disease (NSTEMI in 8/17 with subsequent CABG), ischemic cardiomyopathy, paroxysmal atrial fibrillation, hypertension, hyperlipidemia, type 2 diabetes mellitus, COPD, and prior tobacco use, who presents for follow-up of coronary artery disease and cardiomyopathy.  Had previously been admitted from 8/23 - 06/19/2016 for I&D of a left axillary abscess. The day following the procedure, he developed an NSTEMI with hisEKG showingnew inferior Q-waves and lateral ST depression with a peak troponin of 16.06. Echo with reduced EF of 25-35% and akinesis of the basal-midinferolateral and inferior myocardium. Cardiac cath on 8/29 showed severe multivessel CAD with diffuse narrowing of the LAD with 60-70% proximal stenoses, total occlusion of a high marginal/ramus intermediate vessel with distal retrograde filling, 85% LCx stenosis, and 50% stenoses in a small nondominant RCA. CABG was recommended. He was not thought to be a current CABG candidate with his active MRSA wound along his axilla. Was seen in the office by Dr. Prescott Gum on 10/25 and CABG was scheduled for 08/24/2016. This was performed with LIMA-LAD, SVG-D1, and SVG-RI with clipping of the atrial appendage. He required pressor support following surgery with Dopamine, Milrinone, and Norepinephrine. Developed transient episodes of PAF and was started on IV Amiodarone. Pressor support was weaned throughout the hospitalization and IV Amiodarone was switched to PO Amiodarone 463m daily with this being reduced to 2050mdaily prior to discharge due to his QTc being > 500 ms. Was started on Coumadin for his atrial fibrillation. Was also continued on ASA 8152maily, Atorvastatin 50m12mily, Digoxin 0.125mg45mily, Lasix 40mg 50my, and Lopressor 12.5mg BI16mWas not started on an ACE-I/ARB secondary to hypotension and renal insufficiency. Weight at time of discharge was 158 lbs with creatinine of 1.35. Hgb stable at 9.0.  [name] seen in 11/2016 by Dr. Hilty, Debara Picketthich time he was feeling much better.  LVEF had improved to 45%.  He had not had any recurrent atrial fibrillation but was experiencing side effects from amiodarone, including tremors prompting him to see a neurologist.  Amiodarone and warfarin were discontinued at that time.  Today, [name] intermittent chest pain over the last week that seems localized to the left upper chest.  It is an ache that sometimes last most of the day.  It is relieved by elevating his arm above the head.  He notes that the pain began a few days after he fell out of a trailer onto his left arm.  He does not have any tenderness when he palpates his left chest wall.  The pain is not exertional and is without accompanying symptoms including shortness of breath, palpitations, nausea, and diaphoresis.  Up until a week ago, [name] well without any chest pain.  He is tolerating his medications well.  He denies orthopnea, PND, and edema.  Only medication change since his last visit was addition of Jardiance for management of diabetes.  He also notes that he did not have any symptoms leading up to his heart attack and subsequent CABG.  --------------------------------------------------------------------------------------------------  Past Medical History:  Diagnosis Date  . Abrasion L FOREARM  . Atrial fibrillation (HCC)   [place] initially occurring in post-op setting in 05/2016, started on Eliquis b. 08/2016: s/p clipping of atrial appendage at  time of CABG --> Eliquis switched to Coumadin  . CAD (coronary artery disease)    a. 05/2016: NSTEMI, cath showing 3v disease with CABG recommended once MRSA infection resolved. b. 08/2016: CABG with  LIMA-LAD, SVG-D1, and SVG-RI.  Marland Kitchen Cancer ([place])    basal cell carcinoma of the skin  . Diabetes mellitus without complication ([place])   . Headache(784.0)   . Hematuria, microscopic "SINCE I WAS A KID"  . Hyperlipidemia   . Hypertension   . Ischemic cardiomyopathy    a. 05/2016: echo w/ EF of 25-35% and akinesis of the basal-midinferolateral and inferior myocardium.  . Lactose intolerance   . Myocardial infarction ([place])   . Respiratory failure ([place])   . Rotator cuff tear RIGHT  . Tobacco use    a. quit in 05/2016 following MI   Past Surgical History:  Procedure Laterality Date  . CARDIAC CATHETERIZATION N/A 06/16/2016   Procedure: Left Heart Cath and Coronary Angiography;  Surgeon: Troy Sine, MD;  Location: [facility];  Service: Cardiovascular;  Laterality: N/A;  . CLIPPING OF ATRIAL APPENDAGE N/A 08/24/2016   Procedure: CLIPPING OF ATRIAL APPENDAGE;  Surgeon: Ivin Poot, MD;  Location: [facility];  Service: Open Heart Surgery;  Laterality: N/A;  . CORONARY ARTERY BYPASS GRAFT N/A 08/24/2016   Procedure: CORONARY ARTERY BYPASS GRAFTING (CABG) x three,  using left internal mammary artery and right leg greater saphenous vein harvested endoscopically;  Surgeon: Ivin Poot, MD;  Location: [facility];  Service: Open Heart Surgery;  Laterality: N/A;  . HERNIA REPAIR  X2  . INCISION AND DRAINAGE ABSCESS Left 06/10/2016   Procedure: INCISION AND DRAINAGE LEFT AXILLARY ABSCESS;  Surgeon: Autumn Messing III, MD;  [facility];  Service: General;  Laterality: Left;  . ROTATOR CUFF REPAIR Bilateral   . TEE WITHOUT CARDIOVERSION N/A 08/24/2016   Procedure: TRANSESOPHAGEAL ECHOCARDIOGRAM (TEE);  Surgeon: Ivin Poot, MD;  Location: [facility];  Service: Open Heart Surgery;  Laterality: N/A;    Current Meds  Medication Sig  . acetaminophen (TYLENOL) 500 MG tablet Take 1,000 mg by mouth every 6 (six) hours as needed for headache.  Marland Kitchen aspirin 81 MG chewable tablet Chew 1 tablet (81 mg total) by  mouth daily.  Marland Kitchen atorvastatin (LIPITOR) 80 MG tablet Take 1 tablet (80 mg total) by mouth at bedtime.  . Blood Glucose Monitoring Suppl (FIFTY50 GLUCOSE METER 2.0) w/Device KIT Use as directed  . citalopram (CELEXA) 10 MG tablet Take 1 tablet (10 mg total) by mouth daily.  . empagliflozin (JARDIANCE) 25 MG TABS tablet Take by mouth.  Marland Kitchen glipiZIDE (GLUCOTROL) 10 MG tablet Take 1 tablet (10 mg total) by mouth 2 (two) times daily before a meal. **  NO FURTHER REFILLS UNTIL SEEN IN OFFICE **  . glucose blood test strip Use as instructed  . Insulin Glargine (BASAGLAR KWIKPEN) 100 UNIT/ML SOPN Inject 0.05 mLs (5 Units total) into the skin at bedtime. (Patient taking differently: Inject 20 Units into the skin at bedtime. )  . Insulin Pen Needle 32G X 4 MM MISC Use to inject Insulin daily. Dx: E11.9  . Lancets (ONETOUCH ULTRASOFT) lancets Use as instructed  . liraglutide (VICTOZA) 18 MG/3ML SOPN Inject into the skin.  . metoprolol succinate (TOPROL-XL) 100 MG 24 hr tablet Take by mouth.  . pramipexole (MIRAPEX) 0.125 MG tablet TAKE 1 TABLET (0.125 MG TOTAL) BY MOUTH AT BEDTIME.  . ranitidine (ZANTAC 75) 75 MG tablet Take 1 tablet (75 mg total)  by mouth 2 (two) times daily.    Allergies: Latex  Social History   Tobacco Use  . Smoking status: Current Every Day Smoker    Packs/day: 0.50    Types: Cigarettes  . Smokeless tobacco: Never Used  Substance Use Topics  . Alcohol use: No  . Drug use: No    Family History  Problem Relation Age of Onset  . Hypertension Father   . Sudden death Father   . Diabetes Father   . Heart attack Father   . Hypertension Mother   . Heart attack Sister     Review of Systems: A 12-system review of systems was performed and was negative except as noted in the HPI.  --------------------------------------------------------------------------------------------------  Physical Exam: BP 124/80 (BP Location: Left Arm, Patient Position: Sitting, Cuff Size: Normal)    Pulse 100   Ht 5' 5" (1.651 m)   Wt 174 lb 4 oz (79 kg)   BMI 29.00 kg/m   General: NAD. HEENT: No conjunctival pallor or scleral icterus. Moist mucous membranes.  OP clear. Neck: Supple without lymphadenopathy, thyromegaly, JVD, or HJR. No carotid bruit. Lungs: Normal work of breathing. Clear to auscultation bilaterally without wheezes or crackles. Heart: Regular rate and rhythm without murmurs, rubs, or gallops.  No chest wall tenderness to palpation.  Non-displaced PMI. Abd: Bowel sounds present. Soft, NT/ND without hepatosplenomegaly Ext: No lower extremity edema. Radial, PT, and DP pulses are 2+ bilaterally. Skin: Warm and dry without rash.  EKG: Normal sinus rhythm with inferior Q waves.  Nonetheless, no significant abnormalities.  Lab Results  Component Value Date   WBC 7.5 01/05/2018   HGB 14.5 01/05/2018   HCT 43.1 01/05/2018   MCV 85.0 01/05/2018   PLT 118.0 (L) 01/05/2018    Lab Results  Component Value Date   NA 132 (L) 01/05/2018   K 4.8 01/05/2018   CL 97 01/05/2018   CO2 25 01/05/2018   BUN 32 (H) 01/05/2018   CREATININE 1.36 01/05/2018   GLUCOSE 600 (HH) 01/05/2018   ALT 31 03/03/2018    Lab Results  Component Value Date   CHOL 137 01/05/2018   HDL 29.30 (L) 01/05/2018   LDLCALC 123 (H) 05/23/2013   LDLDIRECT 75.0 01/05/2018   TRIG 334.0 (H) 01/05/2018   CHOLHDL 5 01/05/2018    --------------------------------------------------------------------------------------------------  ASSESSMENT AND PLAN: Coronary artery disease with atypical chest pain I suspect pain is most likely musculoskeletal from recent fall onto the left arm/shoulder.  However, it should be noted that Mr. Dimmick had minimal symptoms leading up to his MI in 05/2016 and subsequent CABG.  He has multiple cardiac risk factors, including continued tobacco use.  We have therefore agreed to perform an exercise myocardial perfusion stress test to exclude significant ischemia.  If the study  is high risk or symptoms do not improve with conservative therapy, will need to consider cardiac catheterization.  We will continue indefinite aspirin 81 mg daily and high-intensity statin therapy.  Ischemic cardiomyopathy Mild to moderately reduced LVEF noted by most recent echo.  Mr. Denault appears euvolemic and well compensated with NYHA class I-II symptoms.  We will continue his current dose of metoprolol succinate.  ACE inhibitor/ARB previously deferred due to concerns over the low blood pressure.  Blood pressure is normal today.  However, I will defer this until his follow-up visit.  Hypertension Blood pressure normal to borderline elevated.  Continue current medications with consideration for addition of ACE inhibitor/ARB at follow-up.  Hyperlipidemia Most recent LDL in  March was just above goal at 45.  Continue atorvastatin milligrams daily with lifestyle modifications.  If LDL remains above 70 in the future, we will need to consider adding ezetimibe or a PCSK9 inhibitor.  Type 2 diabetes mellitus Continue current regimen, as directed by PCP.  Tobacco abuse Mr. Errington had quit after his surgery in 2017.  Unfortunately, he has returned to smoking.  I encouraged him to quit again.  We discussed quitting strategies, including nicotine replacement and pharmacotherapy.  We will defer the latter at this time.  Follow-up: Return to clinic in 1 month.  Nelva Bush, MD 04/20/2018 2:58 PM

## 2018-04-20 NOTE — Patient Instructions (Addendum)
Medication Instructions: A prescription has been sent in for nitroglycerin as needed for chest pain.    -Place 1 tablet (0.4 mg total) under the tongue every 5 (five) minutes as needed  for chest pain. If you need to use three pills and do not have any relief, please  call EMS or go to the  emergency room.   If you need a refill on your cardiac medications before your next appointment, please call your pharmacy.   Procedures/Testing: Your physician has requested that you have an exercise stress myoview. For further information please visit [email or web address]. Please follow instruction sheet, as given.   Follow-Up:  Your physician recommends that you schedule a follow-up appointment in: one month with an APP.   Thank you for choosing Heartcare at [facility]!     [place]  Your provider has ordered a Stress Test with nuclear imaging. The purpose of this test is to evaluate the blood supply to your heart muscle. This procedure is referred to as a "Non-Invasive Stress Test." This is because other than having an IV started in your vein, nothing is inserted or "invades" your body. Cardiac stress tests are done to find areas of poor blood flow to the heart by determining the extent of coronary artery disease (CAD). Some patients exercise on a treadmill, which naturally increases the blood flow to your heart, while others who are unable to walk on a treadmill due to physical limitations have a pharmacologic/chemical stress agent called Lexiscan . This medicine will mimic walking on a treadmill by temporarily increasing your coronary blood flow.   Please note: these test may take anywhere between 2-4 hours to complete  PLEASE REPORT TO [facility] AT THE FIRST DESK WILL DIRECT YOU WHERE TO GO  Date of Procedure:_____________________________________  Arrival Time for Procedure:______________________________  Instructions regarding medication:   __X__ : Hold  diabetes medication morning of procedure  __X___: Hold all beta blockers (Metoprolol)  the morning of the procedure and the day before.   PLEASE NOTIFY THE OFFICE AT LEAST 73 HOURS IN ADVANCE IF YOU ARE UNABLE TO KEEP YOUR APPOINTMENT.  [phone] AND  PLEASE NOTIFY NUCLEAR MEDICINE AT [facility] AT LEAST 24 HOURS IN ADVANCE IF YOU ARE UNABLE TO KEEP YOUR APPOINTMENT. [phone]  How to prepare for your Myoview test:  1. Do not eat or drink after midnight 2. No caffeine for 24 hours prior to test 3. No smoking 24 hours prior to test. 4. Your medication may be taken with water.  If your doctor stopped a medication because of this test, do not take that medication.  Return to clinic in 1 month. 5. Ladies, please do not wear dresses.  Skirts or pants are appropriate. Please wear a short sleeve shirt. 6. No perfume, cologne or lotion. 7. Wear comfortable walking shoes. No heels!

## 2018-04-21 ENCOUNTER — Encounter: Payer: Self-pay | Admitting: Internal Medicine

## 2018-04-21 DIAGNOSIS — E785 Hyperlipidemia, unspecified: Secondary | ICD-10-CM | POA: Insufficient documentation

## 2018-04-26 ENCOUNTER — Ambulatory Visit
Admission: RE | Admit: 2018-04-26 | Discharge: 2018-04-26 | Disposition: A | Discharge status: Home / Self Care | Payer: Self-pay | Source: Ambulatory Visit | Attending: Internal Medicine | Admitting: Internal Medicine

## 2018-04-26 DIAGNOSIS — I255 Ischemic cardiomyopathy: Secondary | ICD-10-CM | POA: Insufficient documentation

## 2018-04-26 DIAGNOSIS — I25119 Atherosclerotic heart disease of native coronary artery with unspecified angina pectoris: Secondary | ICD-10-CM | POA: Insufficient documentation

## 2018-04-26 LAB — NM MYOCAR MULTI W/SPECT W/WALL MOTION / EF
CHL CUP MPHR: 164 {beats}/min
CSEPED: 11 min
CSEPEDS: 26 s
Estimated workload: 11.5 METS
LV sys vol: 53 mL
LVDIAVOL: 102 mL (ref 62–150)
Peak HR: 133 {beats}/min
Percent HR: 81 %
Rest HR: 93 {beats}/min
TID: 0.95

## 2018-04-26 MED ORDER — TECHNETIUM TC 99M TETROFOSMIN IV KIT
12.7600 | PACK | Freq: Once | INTRAVENOUS | Status: AC | PRN
  Administered 2018-04-26: 12.76 via INTRAVENOUS

## 2018-04-26 MED ORDER — TECHNETIUM TC 99M TETROFOSMIN IV KIT
30.1400 | PACK | Freq: Once | INTRAVENOUS | Status: AC | PRN
  Administered 2018-04-26: 30.14 via INTRAVENOUS

## 2018-05-19 ENCOUNTER — Ambulatory Visit: Payer: Self-pay | Admitting: Internal Medicine

## 2018-05-30 ENCOUNTER — Ambulatory Visit: Payer: BLUE CROSS/BLUE SHIELD | Admitting: Gastroenterology

## 2018-08-02 ENCOUNTER — Other Ambulatory Visit: Payer: Self-pay | Admitting: Family Medicine

## 2018-08-02 MED ORDER — ATORVASTATIN CALCIUM 80 MG PO TABS: 80.0000 mg | ORAL_TABLET | Freq: Every day | ORAL | 3 refills | Status: DC

## 2018-08-02 NOTE — Telephone Encounter (Signed)
Copied from [place] [id number]. Topic: Quick Communication - Rx Refill/Question >> Aug 02, 2018  2:56 PM Margot Ables wrote: Medication: metoprolol tartrate 25mg  take 1 two times per day - pt is out - it was last ordered by former PCP atorvastatin (LIPITOR) 80 MG tablet 1x day at bedtime - pt is out  Pt has lost his insurance and cannot afford OV payment right now - please advise  Last OV 02/21/18 with Carlean Purl, NP  Has the patient contacted their pharmacy? Yes - told to call for new RX Preferred Pharmacy (with phone number or street name): [facility] [address], [place] [phone] (Phone) [phone] (Fax)

## 2018-08-02 NOTE — Telephone Encounter (Signed)
Please call patient and/or his daughter and encourage him to follow up. Tell him about cash discount. Please try to get him in within 2 months. Tell him we will work with him to keep his cost as low as possible.

## 2018-08-02 NOTE — Telephone Encounter (Signed)
Please call patient's pharmacy and verify metoprolol dose- what he requested is not what is on his medication record.

## 2018-08-04 ENCOUNTER — Other Ambulatory Visit: Payer: Self-pay | Admitting: Family Medicine

## 2018-08-04 NOTE — Telephone Encounter (Signed)
Please call patient to see if he has been taking. If he has not been taking recently, needs to have office visit before it is restarted. If you are able to verify that he has been taking, can send in metoprolol 25 mg BID # 60 with 1 refill. He needs his other labs including his diabetes followed. I also have a message to Jeani Hawking to get him scheduled. Please tell him we will work with him to keep his costs as low as possible.

## 2018-08-04 NOTE — Telephone Encounter (Signed)
Called and left Vm for pt to return call to office.  

## 2018-08-04 NOTE — Telephone Encounter (Signed)
Called and spoke [facility] they are not aware because they have never filled it. Call and spoke with [facility] they state that he transferred all his prescriptions but the last time it was filled was in April and it was for the 25mg .

## 2018-08-04 NOTE — Telephone Encounter (Signed)
Caller Name: Wende Crease - daughter Phone: [phone]   Pharmacy: [facility] daughter called and pt advised her that he has been taking metoprolol regularly and did state he takes it twice per day.   I discussed self pay discount on 10/15 and again with [name] today. She states the pt is currently out of work and just cannot afford the payment right now.   Please call [name] with any questions/concerns.

## 2018-08-04 NOTE — Telephone Encounter (Signed)
Spoke with patients daughter she states that he has been taking the 25mg . He has been taking it everyday BID. Informed her that I would send in a refill but further refills would require a OV. Understanding verbalized nothing further needed at this time.

## 2018-08-08 MED ORDER — METOPROLOL TARTRATE 25 MG PO TABS: 25.0000 mg | ORAL_TABLET | Freq: Two times a day (BID) | ORAL | 1 refills | Status: DC

## 2018-08-08 NOTE — Telephone Encounter (Signed)
I spoke with [place] CMA who said OK to fill metoprolol 25 mg taking one tab bid x1 refill. [name] voiced understanding and will ck with [facility] [address].

## 2018-08-08 NOTE — Addendum Note (Signed)
Addended by: Helene Shoe on: 08/08/2018 08:33 AM   Modules accepted: Orders

## 2018-10-25 ENCOUNTER — Other Ambulatory Visit: Payer: Self-pay

## 2018-10-25 DIAGNOSIS — F339 Major depressive disorder, recurrent, unspecified: Secondary | ICD-10-CM

## 2018-10-25 MED ORDER — CITALOPRAM HYDROBROMIDE 10 MG PO TABS: 10.0000 mg | ORAL_TABLET | Freq: Every day | ORAL | 0 refills | Status: DC

## 2018-10-25 NOTE — Telephone Encounter (Signed)
Spoke with patient today. Received refill request for Citalopram from Pharmacy. Last filled in May 2019 for 90 day supply with 1 additional refill. LOV was 02/21/18. No future appointments. Patient is starting a new job and will have insurance after February 1st. Appointment made for 11/30/2018 to follow up. Refill sent in for 90 day with no additional refills. Patient is aware that if he needs to reschedule that he needs to do so before he runs of his medication. Patient verbalized understanding.

## 2018-11-30 ENCOUNTER — Ambulatory Visit: Payer: Self-pay | Admitting: Family Medicine

## 2018-11-30 DIAGNOSIS — Z0289 Encounter for other administrative examinations: Secondary | ICD-10-CM

## 2018-12-30 ENCOUNTER — Other Ambulatory Visit: Payer: Self-pay

## 2018-12-30 ENCOUNTER — Ambulatory Visit: Payer: Self-pay | Admitting: Family Medicine

## 2018-12-30 ENCOUNTER — Encounter: Payer: Self-pay | Admitting: Family Medicine

## 2018-12-30 VITALS — BP 120/80 | HR 97 | Resp 14 | Ht 65.0 in | Wt 165.0 lb

## 2018-12-30 DIAGNOSIS — F339 Major depressive disorder, recurrent, unspecified: Secondary | ICD-10-CM

## 2018-12-30 DIAGNOSIS — E1165 Type 2 diabetes mellitus with hyperglycemia: Secondary | ICD-10-CM

## 2018-12-30 DIAGNOSIS — I10 Essential (primary) hypertension: Secondary | ICD-10-CM

## 2018-12-30 DIAGNOSIS — E7800 Pure hypercholesterolemia, unspecified: Secondary | ICD-10-CM

## 2018-12-30 LAB — POCT GLYCOSYLATED HEMOGLOBIN (HGB A1C): HEMOGLOBIN A1C: 9.5 % — AB (ref 4.0–5.6)

## 2018-12-30 MED ORDER — INSULIN NPH ISOPHANE & REGULAR (70-30) 100 UNIT/ML ~~LOC~~ SUSP: 10.0000 [IU] | Freq: Two times a day (BID) | SUBCUTANEOUS | 11 refills | Status: DC

## 2018-12-30 MED ORDER — CITALOPRAM HYDROBROMIDE 10 MG PO TABS: 10.0000 mg | ORAL_TABLET | Freq: Every day | ORAL | 3 refills | Status: DC

## 2018-12-30 MED ORDER — METOPROLOL TARTRATE 25 MG PO TABS: 25.0000 mg | ORAL_TABLET | Freq: Two times a day (BID) | ORAL | 3 refills | Status: DC

## 2018-12-30 MED ORDER — "INSULIN SYRINGE 31G X 5/16"" 0.3 ML MISC": 1.0000 [IU] | Freq: Two times a day (BID) | 2 refills | Status: DC

## 2018-12-30 MED ORDER — METFORMIN HCL ER 500 MG PO TB24: 500.0000 mg | ORAL_TABLET | Freq: Every day | ORAL | 3 refills | Status: DC

## 2018-12-30 MED ORDER — ATORVASTATIN CALCIUM 80 MG PO TABS: 80.0000 mg | ORAL_TABLET | Freq: Every day | ORAL | 3 refills | Status: DC

## 2018-12-30 MED ORDER — GLIPIZIDE 10 MG PO TABS: 10.0000 mg | ORAL_TABLET | Freq: Two times a day (BID) | ORAL | 3 refills | Status: DC

## 2018-12-30 NOTE — Progress Notes (Signed)
Subjective:    Patient ID: JOSEPHINE RUDNICK, male    DOB: Jan 07, 1961, 58 y.o.   MRN: [id number]  HPI This is a 58 yo male who presents today for follow up of diabetes mellitus type 2, on long term insulin. He lost his job last May. Is working part time at [employer]. He has been out of some of his medications, blood sugar running over 200 when he checks. Has testing supplies at home. ? Some polyuria. Saw endocrine, but was unable to follow up due to loss of insurance.   Daily headache, upon awakening, top of head, pain 5-6/10, takes 2 tylenol and pain resolves. No visual changes. Last eye exam 1-2 years ago.No known snoring, apnea.    CAD-. Saw cardiology 7/19- was having some pain that was ? Musculoskeletal,   had exercise myocardial perfusion stress test 04/26/18- no ischemia, no changes in EF. Continue beta blocker, asa 81, high intensity statin. Patient did not follow up due to losing health insurance.   GI problems/diarrhea- saw Dr. Isaiah Blakes 02/24/18 just before he lost his job. Plan was for colonoscopy which patient was unable to schedule. Labs were unremarkable.    Past Medical History:  Diagnosis Date  . Abrasion L FOREARM  . Atrial fibrillation ([place])    a. initially occurring in post-op setting in 05/2016, started on Eliquis b. 08/2016: s/p clipping of atrial appendage at time of CABG --> Eliquis switched to Coumadin  . CAD (coronary artery disease)    a. 05/2016: NSTEMI, cath showing 3v disease with CABG recommended once MRSA infection resolved. b. 08/2016: CABG with LIMA-LAD, SVG-D1, and SVG-RI.  Marland Kitchen Cancer ([place])    basal cell carcinoma of the skin  . Diabetes mellitus without complication ([place])   . Headache(784.0)   . Hematuria, microscopic "SINCE I WAS A KID"  . Hyperlipidemia   . Hypertension   . Ischemic cardiomyopathy    a. 05/2016: echo w/ EF of 25-35% and akinesis of the basal-midinferolateral and inferior myocardium.  . Lactose intolerance   . Myocardial infarction ([place])    . Respiratory failure ([place])   . Rotator cuff tear RIGHT  . Tobacco use    a. quit in 05/2016 following MI   Past Surgical History:  Procedure Laterality Date  . CARDIAC CATHETERIZATION N/A 06/16/2016   Procedure: Left Heart Cath and Coronary Angiography;  Surgeon: Troy Sine, MD;  Location: [facility];  Service: Cardiovascular;  Laterality: N/A;  . CLIPPING OF ATRIAL APPENDAGE N/A 08/24/2016   Procedure: CLIPPING OF ATRIAL APPENDAGE;  Surgeon: Ivin Poot, MD;  Location: [facility];  Service: Open Heart Surgery;  Laterality: N/A;  . CORONARY ARTERY BYPASS GRAFT N/A 08/24/2016   Procedure: CORONARY ARTERY BYPASS GRAFTING (CABG) x three,  using left internal mammary artery and right leg greater saphenous vein harvested endoscopically;  Surgeon: Ivin Poot, MD;  Location: [facility];  Service: Open Heart Surgery;  Laterality: N/A;  . HERNIA REPAIR  X2  . INCISION AND DRAINAGE ABSCESS Left 06/10/2016   Procedure: INCISION AND DRAINAGE LEFT AXILLARY ABSCESS;  Surgeon: Autumn Messing III, MD;  [facility];  Service: General;  Laterality: Left;  . ROTATOR CUFF REPAIR Bilateral   . TEE WITHOUT CARDIOVERSION N/A 08/24/2016   Procedure: TRANSESOPHAGEAL ECHOCARDIOGRAM (TEE);  Surgeon: Ivin Poot, MD;  Location: [facility];  Service: Open Heart Surgery;  Laterality: N/A;   Family History  Problem Relation Age of Onset  . Hypertension Father   .  Sudden death Father   . Diabetes Father   . Heart attack Father   . Hypertension Mother   . Heart attack Sister    Social History   Tobacco Use  . Smoking status: Current Every Day Smoker    Packs/day: 0.50    Types: Cigarettes  . Smokeless tobacco: Never Used  Substance Use Topics  . Alcohol use: No  . Drug use: No      Review of Systems Per HPI    Objective:   Physical Exam Physical Exam  Constitutional: Oriented to person, place, and time. He appears well-developed and well-nourished.  HENT:  Head: Normocephalic and  atraumatic.  Eyes: Conjunctivae are normal.  Neck: Normal range of motion. Neck supple.  Cardiovascular: Normal rate, regular rhythm and normal heart sounds.   Pulmonary/Chest: Effort normal and breath sounds normal.  Musculoskeletal: Normal range of motion.  Neurological: Alert and oriented to person, place, and time.  Skin: Skin is warm and dry.  Psychiatric: Normal mood and affect. Behavior is normal. Judgment and thought content normal.  Vitals reviewed.     BP 120/80 (BP Location: Left Arm, Patient Position: Sitting)   Pulse 97   Resp 14   Ht 5\' 5"  (1.651 m)   Wt 165 lb (74.8 kg)   SpO2 97%   BMI 27.46 kg/m  Wt Readings from Last 3 Encounters:  12/30/18 165 lb (74.8 kg)  04/20/18 174 lb 4 oz (79 kg)  02/24/18 170 lb 3.2 oz (77.2 kg)   Results for orders placed or performed in visit on 12/30/18  HgB A1c  Result Value Ref Range   Hemoglobin A1C 9.5 (A) 4.0 - 5.6 %   HbA1c POC (<> result, manual entry)     HbA1c, POC (prediabetic range)     HbA1c, POC (controlled diabetic range)      Assessment & Plan:  1. Uncontrolled type 2 diabetes mellitus with hyperglycemia ([place]) - follow up in 1 month, he is to bring his meter - discussed diet, exercise, checking blood sugar prior to administering insulin - HgB A1c - glipiZIDE (GLUCOTROL) 10 MG tablet; Take 1 tablet (10 mg total) by mouth 2 (two) times daily before a meal.  Dispense: 180 tablet; Refill: 3 - metFORMIN (GLUCOPHAGE-XR) 500 MG 24 hr tablet; Take 1 tablet (500 mg total) by mouth daily with breakfast.  Dispense: 90 tablet; Refill: 3 - insulin NPH-regular Human (NOVOLIN 70/30 RELION) (70-30) 100 UNIT/ML injection; Inject 10 Units into the skin 2 (two) times daily with a meal.  Dispense: 10 mL; Refill: 11 - Insulin Syringe-Needle U-100 (INSULIN SYRINGE .3CC/31GX5/16") 31G X 5/16" 0.3 ML MISC; 1 Units by Does not apply route 2 (two) times daily.  Dispense: 100 each; Refill: 2  2. Depression, recurrent ([place]) - citalopram  (CELEXA) 10 MG tablet; Take 1 tablet (10 mg total) by mouth daily.  Dispense: 90 tablet; Refill: 3  3. Pure hypercholesterolemia - atorvastatin (LIPITOR) 80 MG tablet; Take 1 tablet (80 mg total) by mouth at bedtime.  Dispense: 90 tablet; Refill: 3  4. Essential hypertension - metoprolol tartrate (LOPRESSOR) 25 MG tablet; Take 1 tablet (25 mg total) by mouth 2 (two) times daily.  Dispense: 180 tablet; Refill: [place], FNP-BC  [facility], [facility]  01/01/2019 8:49 PM

## 2018-12-30 NOTE — Patient Instructions (Signed)
Please follow up in 1 month, bring your meter  I have sent in a new insulin to your pharmacy, take 10 units twice a day with your breakfast and dinner  If you have any problems with the medication, please follow up

## 2019-02-03 ENCOUNTER — Encounter: Payer: Self-pay | Admitting: Family Medicine

## 2019-02-03 ENCOUNTER — Ambulatory Visit (INDEPENDENT_AMBULATORY_CARE_PROVIDER_SITE_OTHER): Payer: Self-pay | Admitting: Family Medicine

## 2019-02-03 DIAGNOSIS — R142 Eructation: Secondary | ICD-10-CM

## 2019-02-03 DIAGNOSIS — E1165 Type 2 diabetes mellitus with hyperglycemia: Secondary | ICD-10-CM

## 2019-02-03 DIAGNOSIS — R296 Repeated falls: Secondary | ICD-10-CM

## 2019-02-03 NOTE — Progress Notes (Signed)
Virtual Visit via Video Note  I connected with [name] on 02/03/19 at 12:00 PM EDT by a video enabled telemedicine application and verified that I am speaking with the correct person using two identifiers. The patient's daughter, Roselyn Reef, also participated in the call. The patient was in his home and was in my office.    I discussed the limitations of evaluation and management by telemedicine and the availability of in person appointments. The patient expressed understanding and agreed to proceed.  History of Present Illness: This is a 58 year old male who is doing virtual visit today for follow-up of diabetes.  He was seen 12/30/2018 with elevated hemoglobin A1c.  He had been off his medications and he was restarted on metformin and insulin. He had samples of Victoza but was about to run out and would not be able to afford. He was placed on glipizide 10 mg, metformin 500 mg BID and insulin NPH 10 units bid with meals and insulin glargine 5 units qhs. The patient reports that he restarted his metformin but stopped it about 1-2 weeks ago because he was having excessive burping which he sometimes gets. The burping did not resolve with stopping metformin. Prior to stopping his metformin, his blood sugars were improved, running 110-180 but this week have been up, running 200-400. He also reports some recent dietary indiscretion, having eaten cake and ice cream for a birthday celebration. He has not started his insulin, was still finishing up the Victoza he had.   Recent diarrhea/vomiting that started 5 days ago. Felt "terrible" at onset and multiple episodes of vomiting and diarrhea. No diarrhea or vomiting for last 2 days. Has been working on fluid intake. Urine light yellow today. He still has some abdominal cramping. Ate a sausage gravy biscuit earlier today.   Aside from recent GI illness, he is concerned about intermittent episodes of belching with foul smell/bad taste. He is concerned he has  gastroparesis. Prior to recent GI illness, no vomiting. He does have occasional nocturnal fecal incontinence. He was seen by GI 02/24/18, Dr. Rose Phi, with these concerns and it was recommended that he control glucose, take ranitidine BID, avoid triggers and have EGD/colonoscopy. Unfortunately, he lost his job and insurance and was unable to afford testing and follow up. He believes he is lactose intolerant but is unable to specifically pinpoint belching to lactose consumption. He has frequent bowel movements, sometimes diarrhea, no blood. Not sure if worse on metformin.   He has been having problems with frequent falls since CABG 2017. He states that he falls about 1x/ week, always forward, usually falling to his knees. He wears slip on shoes and can not describe the mechanism of his fall. Has a history of neuropathy and is not sure if he is tripping on his feet. He can't run in a straight line. Handwriting is shaky, slow although he thinks this is a little better recently.   He has been out of work since last fall, taking a part time job at [employer] until it was closed due to [employer] virus pandemic. He is concerned that he will not be able to work regularly due to his problems.   Past Medical History:  Diagnosis Date  . Abrasion L FOREARM  . Atrial fibrillation ([place])    a. initially occurring in post-op setting in 05/2016, started on Eliquis b. 08/2016: s/p clipping of atrial appendage at time of CABG --> Eliquis switched to Coumadin  . CAD (coronary artery disease)  a. 05/2016: NSTEMI, cath showing 3v disease with CABG recommended once MRSA infection resolved. b. 08/2016: CABG with LIMA-LAD, SVG-D1, and SVG-RI.  Marland Kitchen Cancer ([place])    basal cell carcinoma of the skin  . Diabetes mellitus without complication ([place])   . Headache(784.0)   . Hematuria, microscopic "SINCE I WAS A KID"  . Hyperlipidemia   . Hypertension   . Ischemic cardiomyopathy    a. 05/2016: echo w/ EF of 25-35% and akinesis of  the basal-midinferolateral and inferior myocardium.  . Lactose intolerance   . Myocardial infarction ([place])   . Respiratory failure ([place])   . Rotator cuff tear RIGHT  . Tobacco use    a. quit in 05/2016 following MI   Past Surgical History:  Procedure Laterality Date  . CARDIAC CATHETERIZATION N/A 06/16/2016   Procedure: Left Heart Cath and Coronary Angiography;  Surgeon: Troy Sine, MD;  Location: [facility];  Service: Cardiovascular;  Laterality: N/A;  . CLIPPING OF ATRIAL APPENDAGE N/A 08/24/2016   Procedure: CLIPPING OF ATRIAL APPENDAGE;  Surgeon: Ivin Poot, MD;  Location: [facility];  Service: Open Heart Surgery;  Laterality: N/A;  . CORONARY ARTERY BYPASS GRAFT N/A 08/24/2016   Procedure: CORONARY ARTERY BYPASS GRAFTING (CABG) x three,  using left internal mammary artery and right leg greater saphenous vein harvested endoscopically;  Surgeon: Ivin Poot, MD;  Location: [facility];  Service: Open Heart Surgery;  Laterality: N/A;  . HERNIA REPAIR  X2  . INCISION AND DRAINAGE ABSCESS Left 06/10/2016   Procedure: INCISION AND DRAINAGE LEFT AXILLARY ABSCESS;  Surgeon: Autumn Messing III, MD;  [facility];  Service: General;  Laterality: Left;  . ROTATOR CUFF REPAIR Bilateral   . TEE WITHOUT CARDIOVERSION N/A 08/24/2016   Procedure: TRANSESOPHAGEAL ECHOCARDIOGRAM (TEE);  Surgeon: Ivin Poot, MD;  Location: [facility];  Service: Open Heart Surgery;  Laterality: N/A;   Family History  Problem Relation Age of Onset  . Hypertension Father   . Sudden death Father   . Diabetes Father   . Heart attack Father   . Hypertension Mother   . Heart attack Sister    Social History   Tobacco Use  . Smoking status: Current Every Day Smoker    Packs/day: 0.50    Types: Cigarettes  . Smokeless tobacco: Never Used  Substance Use Topics  . Alcohol use: No  . Drug use: No       Observations/Objective: The patient was alert and answered questions appropriately.   Assessment and  Plan: 1. Uncontrolled type 2 diabetes mellitus with hyperglycemia ([place]) -Discussed importance of patient taking his insulin, metformin and glipizide as prescribed as well as watching his diet -Is likely that his uncontrolled diabetes is contributing to many of his other concerns and complaints -I have asked him to keep a log of his blood sugars for the next 2 months and bring those to his next office visit. -Follow-up in approximately 6 weeks  2. Belching -This is been an ongoing complaint and I am not certain that it is related to gastroparesis since he does not seem to have vomiting as well but encouraged glucose control, smoking cessation as well as avoidance of gum chewing, carbonated beverages, alcoholic beverages, and to follow a low-fat, low acid, low insoluble fiber diet to see if symptoms improve.  He also needs to stop his Victoza if he has not already done so. -He also thinks that he is lactose intolerant but does not avoid lactose or  use Lactaid tablets.  I have asked him to strictly avoid lactose for 2 weeks to see if he has improvement and to chart symptoms to try to identify possible triggers  3. Frequent falls -Longstanding and with unclear etiology I have also asked him to log his falls and we will discuss this more thoroughly and do a complete neurological exam at his next office visit   Clarene Reamer, FNP-BC  [facility], [place]  02/05/2019 7:28 PM   Follow Up Instructions: Detailed recap and instructions are sent to patient via his MyChart   I discussed the assessment and treatment plan with the patient. The patient was provided an opportunity to ask questions and all were answered. The patient agreed with the plan and demonstrated an understanding of the instructions.   The patient was advised to call back or seek an in-person evaluation if the symptoms worsen or if the condition fails to improve as anticipated.  Elby Beck, FNP

## 2019-02-04 ENCOUNTER — Encounter: Payer: Self-pay | Admitting: Family Medicine

## 2019-02-05 ENCOUNTER — Encounter: Payer: Self-pay | Admitting: Family Medicine

## 2019-02-17 ENCOUNTER — Encounter: Payer: Self-pay | Admitting: Family Medicine

## 2019-02-17 DIAGNOSIS — E1165 Type 2 diabetes mellitus with hyperglycemia: Principal | ICD-10-CM

## 2019-02-17 DIAGNOSIS — Z794 Long term (current) use of insulin: Principal | ICD-10-CM

## 2019-02-17 DIAGNOSIS — L97509 Non-pressure chronic ulcer of other part of unspecified foot with unspecified severity: Principal | ICD-10-CM

## 2019-02-17 DIAGNOSIS — E11621 Type 2 diabetes mellitus with foot ulcer: Secondary | ICD-10-CM

## 2019-02-17 DIAGNOSIS — IMO0002 Reserved for concepts with insufficient information to code with codable children: Secondary | ICD-10-CM

## 2019-02-20 ENCOUNTER — Encounter: Payer: Self-pay | Admitting: Family Medicine

## 2019-02-20 MED ORDER — GLUCOSE BLOOD VI STRP: 1.0000 | ORAL_STRIP | 3 refills | Status: AC | PRN

## 2019-02-20 NOTE — Telephone Encounter (Signed)
[name], I pulled down refill order for the strips but did not put how often patient should check, please review. I was not sure how often you wanted him to do that. Also please see his question about sugar readings been high due to expired strips?

## 2019-03-03 ENCOUNTER — Telehealth: Payer: Self-pay | Admitting: *Deleted

## 2019-03-03 NOTE — Telephone Encounter (Signed)
Answered patient's question via Mychart.

## 2019-03-03 NOTE — Telephone Encounter (Signed)
Patient stated that he has been called back to work and he is concerned about the direct contact that he has with the public. Patient wants to know what Tor Netters NP thinks about him going back to work because of his diabetes and heart condition?

## 2019-03-06 ENCOUNTER — Encounter: Payer: Self-pay | Admitting: Family Medicine

## 2019-03-06 NOTE — Progress Notes (Signed)
Note for employer.

## 2019-05-26 ENCOUNTER — Telehealth: Payer: Self-pay | Admitting: Internal Medicine

## 2019-05-26 NOTE — Telephone Encounter (Signed)
Recieved request from :[employer] ° °Forwarded to [employer] for processing ° °

## 2019-05-29 ENCOUNTER — Encounter: Payer: Self-pay | Admitting: Family Medicine

## 2019-05-30 NOTE — Telephone Encounter (Signed)
Please advise 

## 2019-06-22 DIAGNOSIS — M79676 Pain in unspecified toe(s): Secondary | ICD-10-CM

## 2019-06-28 DIAGNOSIS — Z736 Limitation of activities due to disability: Secondary | ICD-10-CM

## 2019-07-26 ENCOUNTER — Other Ambulatory Visit: Payer: Self-pay | Admitting: *Deleted

## 2019-07-26 DIAGNOSIS — Z20822 Contact with and (suspected) exposure to covid-19: Secondary | ICD-10-CM

## 2019-07-27 LAB — NOVEL CORONAVIRUS, NAA: SARS-CoV-2, NAA: NOT DETECTED

## 2019-08-16 ENCOUNTER — Telehealth: Payer: Self-pay

## 2019-08-16 ENCOUNTER — Other Ambulatory Visit: Payer: Self-pay

## 2019-08-16 ENCOUNTER — Emergency Department
Admission: EM | Admit: 2019-08-16 | Discharge: 2019-08-17 | Disposition: A | Discharge status: Home / Self Care | Payer: Self-pay | Attending: Emergency Medicine | Admitting: Emergency Medicine

## 2019-08-16 ENCOUNTER — Encounter: Payer: Self-pay | Admitting: Family Medicine

## 2019-08-16 DIAGNOSIS — F17210 Nicotine dependence, cigarettes, uncomplicated: Secondary | ICD-10-CM | POA: Insufficient documentation

## 2019-08-16 DIAGNOSIS — Z85828 Personal history of other malignant neoplasm of skin: Secondary | ICD-10-CM | POA: Insufficient documentation

## 2019-08-16 DIAGNOSIS — Z79899 Other long term (current) drug therapy: Secondary | ICD-10-CM | POA: Insufficient documentation

## 2019-08-16 DIAGNOSIS — Y92009 Unspecified place in unspecified non-institutional (private) residence as the place of occurrence of the external cause: Secondary | ICD-10-CM | POA: Insufficient documentation

## 2019-08-16 DIAGNOSIS — T25332A Burn of third degree of left toe(s) (nail), initial encounter: Secondary | ICD-10-CM | POA: Insufficient documentation

## 2019-08-16 DIAGNOSIS — I2510 Atherosclerotic heart disease of native coronary artery without angina pectoris: Secondary | ICD-10-CM | POA: Insufficient documentation

## 2019-08-16 DIAGNOSIS — T300 Burn of unspecified body region, unspecified degree: Secondary | ICD-10-CM

## 2019-08-16 DIAGNOSIS — I110 Hypertensive heart disease with heart failure: Secondary | ICD-10-CM | POA: Insufficient documentation

## 2019-08-16 DIAGNOSIS — Y9389 Activity, other specified: Secondary | ICD-10-CM | POA: Insufficient documentation

## 2019-08-16 DIAGNOSIS — Z7982 Long term (current) use of aspirin: Secondary | ICD-10-CM | POA: Insufficient documentation

## 2019-08-16 DIAGNOSIS — I5022 Chronic systolic (congestive) heart failure: Secondary | ICD-10-CM | POA: Insufficient documentation

## 2019-08-16 DIAGNOSIS — R739 Hyperglycemia, unspecified: Secondary | ICD-10-CM

## 2019-08-16 DIAGNOSIS — Z794 Long term (current) use of insulin: Secondary | ICD-10-CM | POA: Insufficient documentation

## 2019-08-16 DIAGNOSIS — Y999 Unspecified external cause status: Secondary | ICD-10-CM | POA: Insufficient documentation

## 2019-08-16 DIAGNOSIS — E1165 Type 2 diabetes mellitus with hyperglycemia: Secondary | ICD-10-CM | POA: Insufficient documentation

## 2019-08-16 DIAGNOSIS — X19XXXA Contact with other heat and hot substances, initial encounter: Secondary | ICD-10-CM | POA: Insufficient documentation

## 2019-08-16 DIAGNOSIS — Z951 Presence of aortocoronary bypass graft: Secondary | ICD-10-CM | POA: Insufficient documentation

## 2019-08-16 LAB — COMPREHENSIVE METABOLIC PANEL
ALT: 39 U/L (ref 0–44)
AST: 23 U/L (ref 15–41)
Albumin: 4 g/dL (ref 3.5–5.0)
Alkaline Phosphatase: 171 U/L — ABNORMAL HIGH (ref 38–126)
Anion gap: 11 (ref 5–15)
BUN: 25 mg/dL — ABNORMAL HIGH (ref 6–20)
CO2: 26 mmol/L (ref 22–32)
Calcium: 8.7 mg/dL — ABNORMAL LOW (ref 8.9–10.3)
Chloride: 95 mmol/L — ABNORMAL LOW (ref 98–111)
Creatinine, Ser: 1.44 mg/dL — ABNORMAL HIGH (ref 0.61–1.24)
GFR calc Af Amer: >60 mL/min (ref >60)
GFR calc non Af Amer: 53 mL/min — ABNORMAL LOW (ref >60)
Glucose, Bld: 638 mg/dL (ref 70–99)
Potassium: 3.5 mmol/L (ref 3.5–5.1)
Sodium: 132 mmol/L — ABNORMAL LOW (ref 135–145)
Total Bilirubin: 0.5 mg/dL (ref 0.3–1.2)
Total Protein: 7.4 g/dL (ref 6.5–8.1)

## 2019-08-16 LAB — CBC
HCT: 43.6 % (ref 39.0–52.0)
Hemoglobin: 14.7 g/dL (ref 13.0–17.0)
MCH: 28.5 pg (ref 26.0–34.0)
MCHC: 33.7 g/dL (ref 30.0–36.0)
MCV: 84.7 fL (ref 80.0–100.0)
Platelets: 111 10*3/uL — ABNORMAL LOW (ref 150–400)
RBC: 5.15 MIL/uL (ref 4.22–5.81)
RDW: 12.6 % (ref 11.5–15.5)
WBC: 9.7 10*3/uL (ref 4.0–10.5)
nRBC: 0 % (ref 0.0–0.2)

## 2019-08-16 LAB — GLUCOSE, CAPILLARY: Glucose-Capillary: 460 mg/dL — ABNORMAL HIGH (ref 70–99)

## 2019-08-16 MED ORDER — SODIUM CHLORIDE 0.9 % IV BOLUS: 1000.0000 mL | Freq: Once | INTRAVENOUS | Status: DC

## 2019-08-16 MED ORDER — SILVER SULFADIAZINE 1 % EX CREA
TOPICAL_CREAM | Freq: Once | CUTANEOUS | Status: AC
  Administered 2019-08-17: 02:00:00 via TOPICAL
  Filled 2019-08-16: qty 85

## 2019-08-16 MED ORDER — SODIUM CHLORIDE 0.9 % IV BOLUS
1000.0000 mL | Freq: Once | INTRAVENOUS | Status: AC
  Administered 2019-08-16: 1000 mL via INTRAVENOUS

## 2019-08-16 MED ORDER — INSULIN ASPART 100 UNIT/ML ~~LOC~~ SOLN
20.0000 [IU] | Freq: Once | SUBCUTANEOUS | Status: AC
  Administered 2019-08-16: 20 [IU] via INTRAVENOUS
  Filled 2019-08-16: qty 1
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
## 2019-08-16 NOTE — Telephone Encounter (Signed)
Called patient, and spoke with patient Mother - patient is not home at the moment he stepped out to go to the [employer] store with his daughter. She is going to reach out to him and have him call us back before 5pm.  Patient's Mother is not on DPR - unable to discuss recommendations.

## 2019-08-16 NOTE — ED Notes (Addendum)
Dr. Goodman at the bedside for pt evaluation. 
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
## 2019-08-17 ENCOUNTER — Ambulatory Visit: Payer: Self-pay | Admitting: Sports Medicine

## 2019-08-17 LAB — GLUCOSE, CAPILLARY
Glucose-Capillary: 302 mg/dL — ABNORMAL HIGH (ref 70–99)
Glucose-Capillary: 380 mg/dL — ABNORMAL HIGH (ref 70–99)

## 2019-08-17 NOTE — ED Notes (Addendum)
Pharmacy called for missing medication dose

## 2019-08-17 NOTE — ED Notes (Signed)
Dr. Archie Balboa at the bedside to discuss plan of care.

## 2019-08-17 NOTE — ED Notes (Signed)
Pt lying in bed in NAD, RR equal and unlabored; VS WNL. Pt informed delay r/t missing dose of medication. Pt verbalized understanding.

## 2019-08-17 NOTE — Discharge Instructions (Signed)
Please seek medical attention for any high fevers, chest pain, shortness of breath, change in behavior, persistent vomiting, bloody stool or any other new or concerning symptoms.  

## 2019-08-18 DIAGNOSIS — T25232A Burn of second degree of left toe(s) (nail), initial encounter: Secondary | ICD-10-CM | POA: Insufficient documentation

## 2019-09-10 ENCOUNTER — Other Ambulatory Visit: Payer: Self-pay

## 2019-09-10 ENCOUNTER — Emergency Department: Payer: Self-pay

## 2019-09-10 DIAGNOSIS — Z5321 Procedure and treatment not carried out due to patient leaving prior to being seen by health care provider: Secondary | ICD-10-CM | POA: Insufficient documentation
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
## 2019-09-11 ENCOUNTER — Emergency Department
Admission: EM | Admit: 2019-09-11 | Discharge: 2019-09-11 | Disposition: A | Discharge status: Home / Self Care | Payer: Self-pay | Attending: Emergency Medicine | Admitting: Emergency Medicine

## 2019-09-12 ENCOUNTER — Telehealth: Payer: Self-pay | Admitting: Emergency Medicine

## 2019-09-12 NOTE — Telephone Encounter (Signed)
Called patient due to lwot to inquire about condition and follow up plans. Left message.   

## 2019-09-21 DIAGNOSIS — T25321A Burn of third degree of right foot, initial encounter: Secondary | ICD-10-CM | POA: Insufficient documentation

## 2019-09-22 ENCOUNTER — Telehealth: Payer: Self-pay | Admitting: Family Medicine

## 2019-09-22 NOTE — Telephone Encounter (Signed)
Noted that patient had recent ER visit and blood sugar very elevated. Please see if he will schedule a follow up for his diabetes. Please see if he is taking his insulin and how much. Is he checking home blood sugar readings?

## 2019-09-27 NOTE — Telephone Encounter (Signed)
LM to return call to discuss medications.  Needs to schedule ED follow up
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
## 2019-10-02 NOTE — Telephone Encounter (Signed)
Pt returned call - I was unable to take it due to being on the phone. Pt aware that I will call back.

## 2019-10-04 NOTE — Telephone Encounter (Signed)
Noted  

## 2019-10-05 DIAGNOSIS — I5020 Unspecified systolic (congestive) heart failure: Secondary | ICD-10-CM | POA: Insufficient documentation

## 2019-10-09 ENCOUNTER — Ambulatory Visit (INDEPENDENT_AMBULATORY_CARE_PROVIDER_SITE_OTHER)
Admission: RE | Admit: 2019-10-09 | Discharge: 2019-10-09 | Disposition: A | Discharge status: Home / Self Care | Payer: Self-pay | Source: Ambulatory Visit | Attending: Family Medicine | Admitting: Family Medicine

## 2019-10-09 ENCOUNTER — Other Ambulatory Visit: Payer: Self-pay

## 2019-10-09 ENCOUNTER — Encounter: Payer: Self-pay | Admitting: Family Medicine

## 2019-10-09 ENCOUNTER — Ambulatory Visit (INDEPENDENT_AMBULATORY_CARE_PROVIDER_SITE_OTHER): Payer: Self-pay | Admitting: Family Medicine

## 2019-10-09 VITALS — BP 136/82 | HR 90 | Temp 98.2°F | Ht 66.0 in | Wt 189.4 lb

## 2019-10-09 DIAGNOSIS — J811 Chronic pulmonary edema: Secondary | ICD-10-CM

## 2019-10-09 DIAGNOSIS — M79672 Pain in left foot: Secondary | ICD-10-CM

## 2019-10-09 DIAGNOSIS — M79671 Pain in right foot: Secondary | ICD-10-CM

## 2019-10-09 DIAGNOSIS — R0602 Shortness of breath: Secondary | ICD-10-CM

## 2019-10-09 DIAGNOSIS — F339 Major depressive disorder, recurrent, unspecified: Secondary | ICD-10-CM

## 2019-10-09 DIAGNOSIS — Z598 Other problems related to housing and economic circumstances: Secondary | ICD-10-CM

## 2019-10-09 DIAGNOSIS — Z599 Problem related to housing and economic circumstances, unspecified: Secondary | ICD-10-CM

## 2019-10-09 DIAGNOSIS — E1165 Type 2 diabetes mellitus with hyperglycemia: Secondary | ICD-10-CM

## 2019-10-09 DIAGNOSIS — I480 Paroxysmal atrial fibrillation: Secondary | ICD-10-CM

## 2019-10-09 LAB — MAGNESIUM: Magnesium: 1.8 mg/dL (ref 1.5–2.5)

## 2019-10-09 LAB — BASIC METABOLIC PANEL
BUN: 34 mg/dL — ABNORMAL HIGH (ref 6–23)
CO2: 28 mEq/L (ref 19–32)
Calcium: 8.1 mg/dL — ABNORMAL LOW (ref 8.4–10.5)
Chloride: 105 mEq/L (ref 96–112)
Creatinine, Ser: 1.11 mg/dL (ref 0.40–1.50)
GFR: 67.96 mL/min (ref >60.00)
Glucose, Bld: 181 mg/dL — ABNORMAL HIGH (ref 70–99)
Potassium: 4.8 mEq/L (ref 3.5–5.1)
Sodium: 140 mEq/L (ref 135–145)

## 2019-10-09 LAB — HEMOGLOBIN A1C: Hgb A1c MFr Bld: 11 % — ABNORMAL HIGH (ref 4.6–6.5)

## 2019-10-09 MED ORDER — CITALOPRAM HYDROBROMIDE 20 MG PO TABS: 20.0000 mg | ORAL_TABLET | Freq: Every day | ORAL | 3 refills | Status: DC
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
## 2019-10-09 NOTE — Patient Instructions (Signed)
Good to see you today  Follow up in 4 weeks, bring your glucometer  I have sent in a new prescription for citalopram 20 mg. Take 2x 10 mg until you have used up what you have.   I am sending a message to [employer] about community resources, if you have not heard from her in a week, please reach out to me  Please photo document your feet and your left lower leg red area  Call about the antibiotics started today by [facility] if you have not heard by this afternoon  See information below for carb counting. Aim for 20-30 net grams carbs per meal.   Look at [email or web address]   Carbohydrate Counting for Diabetes Mellitus, Adult  Carbohydrate counting is a method of keeping track of how many carbohydrates you eat. Eating carbohydrates naturally increases the amount of sugar (glucose) in the blood. Counting how many carbohydrates you eat helps keep your blood glucose within normal limits, which helps you manage your diabetes (diabetes mellitus). It is important to know how many carbohydrates you can safely have in each meal. This is different for every person. A diet and nutrition specialist (registered dietitian) can help you make a meal plan and calculate how many carbohydrates you should have at each meal and snack. Carbohydrates are found in the following foods:  Grains, such as breads and cereals.  Dried beans and soy products.  Starchy vegetables, such as potatoes, peas, and corn.  Fruit and fruit juices.  Milk and yogurt.  Sweets and snack foods, such as cake, cookies, candy, chips, and soft drinks. How do I count carbohydrates? There are two ways to count carbohydrates in food. You can use either of the methods or a combination of both. Reading "Nutrition Facts" on packaged food The "Nutrition Facts" list is included on the labels of almost all packaged foods and beverages in the [place] It includes:  The serving size.  Information about nutrients in each serving,  including the grams (g) of carbohydrate per serving. To use the "Nutrition Facts":  Decide how many servings you will have.  Multiply the number of servings by the number of carbohydrates per serving.  The resulting number is the total amount of carbohydrates that you will be having. Learning standard serving sizes of other foods When you eat carbohydrate foods that are not packaged or do not include "Nutrition Facts" on the label, you need to measure the servings in order to count the amount of carbohydrates:  Measure the foods that you will eat with a food scale or measuring cup, if needed.  Decide how many standard-size servings you will eat.  Multiply the number of servings by 15. Most carbohydrate-rich foods have about 15 g of carbohydrates per serving. ? For example, if you eat 8 oz (170 g) of strawberries, you will have eaten 2 servings and 30 g of carbohydrates (2 servings x 15 g = 30 g).  For foods that have more than one food mixed, such as soups and casseroles, you must count the carbohydrates in each food that is included. The following list contains standard serving sizes of common carbohydrate-rich foods. Each of these servings has about 15 g of carbohydrates:   hamburger bun or  English muffin.   oz (15 mL) syrup.   oz (14 g) jelly.  1 slice of bread.  1 six-inch tortilla.  3 oz (85 g) cooked rice or pasta.  4 oz (113 g) cooked dried beans.  4 oz (113 g)  starchy vegetable, such as peas, corn, or potatoes.  4 oz (113 g) hot cereal.  4 oz (113 g) mashed potatoes or  of a large baked potato.  4 oz (113 g) canned or frozen fruit.  4 oz (120 mL) fruit juice.  4-6 crackers.  6 chicken nuggets.  6 oz (170 g) unsweetened dry cereal.  6 oz (170 g) plain fat-free yogurt or yogurt sweetened with artificial sweeteners.  8 oz (240 mL) milk.  8 oz (170 g) fresh fruit or one small piece of fruit.  24 oz (680 g) popped popcorn. Example of carbohydrate  counting Sample meal  3 oz (85 g) chicken breast.  6 oz (170 g) brown rice.  4 oz (113 g) corn.  8 oz (240 mL) milk.  8 oz (170 g) strawberries with sugar-free whipped topping. Carbohydrate calculation 1. Identify the foods that contain carbohydrates: ? Rice. ? Corn. ? Milk. ? Strawberries. 2. Calculate how many servings you have of each food: ? 2 servings rice. ? 1 serving corn. ? 1 serving milk. ? 1 serving strawberries. 3. Multiply each number of servings by 15 g: ? 2 servings rice x 15 g = 30 g. ? 1 serving corn x 15 g = 15 g. ? 1 serving milk x 15 g = 15 g. ? 1 serving strawberries x 15 g = 15 g. 4. Add together all of the amounts to find the total grams of carbohydrates eaten: ? 30 g + 15 g + 15 g + 15 g = 75 g of carbohydrates total. Summary  Carbohydrate counting is a method of keeping track of how many carbohydrates you eat.  Eating carbohydrates naturally increases the amount of sugar (glucose) in the blood.  Counting how many carbohydrates you eat helps keep your blood glucose within normal limits, which helps you manage your diabetes.  A diet and nutrition specialist (registered dietitian) can help you make a meal plan and calculate how many carbohydrates you should have at each meal and snack. This information is not intended to replace advice given to you by your health care provider. Make sure you discuss any questions you have with your health care provider. Document Released: 10/05/2005 Document Revised: 04/29/2017 Document Reviewed: 03/18/2016 Elsevier Patient Education  2020 [employer].

## 2019-10-10 ENCOUNTER — Encounter: Payer: Self-pay | Admitting: Family Medicine

## 2019-10-10 ENCOUNTER — Telehealth: Payer: Self-pay

## 2019-10-10 NOTE — Telephone Encounter (Signed)
Copied from [place] [id number]. Topic: Referral - Status >> Oct 10, 2019  [id number] PM Simone Curia D wrote: [id number] Spoke with patient about the [facility]. Emailed the application to [email or web address] per patient request.  Ambrose Mantle [phone]

## 2019-10-11 NOTE — Telephone Encounter (Signed)
Noted  

## 2019-10-25 ENCOUNTER — Telehealth: Payer: Self-pay

## 2019-10-25 NOTE — Telephone Encounter (Signed)
Copied from [place] [id number]. Topic: Referral - Status >> Oct 25, 2019  [id number] PM Simone Curia D wrote: [id number] Follow-up call. Left message on voicemail for patient to return my call about [facility] Application. Will attempt to call again this week. Ambrose Mantle [phone]

## 2019-11-05 ENCOUNTER — Other Ambulatory Visit (HOSPITAL_COMMUNITY)
Admission: RE | Admit: 2019-11-05 | Discharge: 2019-11-05 | Disposition: A | Discharge status: Home / Self Care | Payer: Medicaid Other | Source: Other Acute Inpatient Hospital | Attending: Internal Medicine | Admitting: Internal Medicine

## 2019-11-05 DIAGNOSIS — M869 Osteomyelitis, unspecified: Secondary | ICD-10-CM | POA: Diagnosis present

## 2019-11-05 LAB — COMPREHENSIVE METABOLIC PANEL
ALT: 26 U/L (ref 0–44)
AST: 30 U/L (ref 15–41)
Albumin: 3.5 g/dL (ref 3.5–5.0)
Alkaline Phosphatase: 120 U/L (ref 38–126)
Anion gap: 9 (ref 5–15)
BUN: 28 mg/dL — ABNORMAL HIGH (ref 6–20)
CO2: 26 mmol/L (ref 22–32)
Calcium: 8.4 mg/dL — ABNORMAL LOW (ref 8.9–10.3)
Chloride: 101 mmol/L (ref 98–111)
Creatinine, Ser: 1.17 mg/dL (ref 0.61–1.24)
GFR calc Af Amer: >60 mL/min (ref >60)
GFR calc non Af Amer: >60 mL/min (ref >60)
Glucose, Bld: 358 mg/dL — ABNORMAL HIGH (ref 70–99)
Potassium: 4.2 mmol/L (ref 3.5–5.1)
Sodium: 136 mmol/L (ref 135–145)
Total Bilirubin: 0.5 mg/dL (ref 0.3–1.2)
Total Protein: 7.7 g/dL (ref 6.5–8.1)

## 2019-11-05 LAB — CBC WITH DIFFERENTIAL/PLATELET
Abs Immature Granulocytes: 0.03 10*3/uL (ref 0.00–0.07)
Basophils Absolute: 0.1 10*3/uL (ref 0.0–0.1)
Basophils Relative: 1 %
Eosinophils Absolute: 0.4 10*3/uL (ref 0.0–0.5)
Eosinophils Relative: 5 %
HCT: 39.3 % (ref 39.0–52.0)
Hemoglobin: 11.8 g/dL — ABNORMAL LOW (ref 13.0–17.0)
Immature Granulocytes: 0 %
Lymphocytes Relative: 23 %
Lymphs Abs: 1.8 10*3/uL (ref 0.7–4.0)
MCH: 26 pg (ref 26.0–34.0)
MCHC: 30 g/dL (ref 30.0–36.0)
MCV: 86.6 fL (ref 80.0–100.0)
Monocytes Absolute: 0.6 10*3/uL (ref 0.1–1.0)
Monocytes Relative: 8 %
Neutro Abs: 5 10*3/uL (ref 1.7–7.7)
Neutrophils Relative %: 63 %
Platelets: 144 10*3/uL — ABNORMAL LOW (ref 150–400)
RBC: 4.54 MIL/uL (ref 4.22–5.81)
RDW: 14.6 % (ref 11.5–15.5)
WBC: 7.9 10*3/uL (ref 4.0–10.5)
nRBC: 0 % (ref 0.0–0.2)

## 2019-11-06 ENCOUNTER — Other Ambulatory Visit: Payer: Self-pay

## 2019-11-06 ENCOUNTER — Encounter: Payer: Self-pay | Admitting: Family Medicine

## 2019-11-06 ENCOUNTER — Ambulatory Visit (INDEPENDENT_AMBULATORY_CARE_PROVIDER_SITE_OTHER): Payer: Self-pay | Admitting: Family Medicine

## 2019-11-06 VITALS — BP 146/78 | HR 89 | Temp 97.4°F | Ht 66.0 in | Wt 175.4 lb

## 2019-11-06 DIAGNOSIS — Z599 Problem related to housing and economic circumstances, unspecified: Secondary | ICD-10-CM

## 2019-11-06 DIAGNOSIS — Z598 Other problems related to housing and economic circumstances: Secondary | ICD-10-CM

## 2019-11-06 DIAGNOSIS — E1165 Type 2 diabetes mellitus with hyperglycemia: Secondary | ICD-10-CM

## 2019-11-06 DIAGNOSIS — M86171 Other acute osteomyelitis, right ankle and foot: Secondary | ICD-10-CM

## 2019-11-06 DIAGNOSIS — R197 Diarrhea, unspecified: Secondary | ICD-10-CM

## 2019-11-06 MED ORDER — ONDANSETRON 8 MG PO TBDP: 8.0000 mg | ORAL_TABLET | Freq: Three times a day (TID) | ORAL | 1 refills | Status: DC | PRN

## 2019-11-06 MED ORDER — METFORMIN HCL ER 500 MG PO TB24: 500.0000 mg | ORAL_TABLET | Freq: Two times a day (BID) | ORAL | 3 refills | Status: DC

## 2019-11-06 NOTE — Telephone Encounter (Signed)
Noted. Information provided to patient and his daughter while in the office 11/06/2019.

## 2019-11-06 NOTE — Patient Instructions (Addendum)
[facility]. Ambrose Mantle [phone]  Please keep a log of your blood sugar readings for two weeks and send me a picture of your readings.   Look on line at zero and low carb snacks  [email or web address]
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
## 2019-11-08 ENCOUNTER — Encounter: Payer: Self-pay | Admitting: Family Medicine

## 2019-11-09 ENCOUNTER — Telehealth: Payer: Self-pay

## 2019-11-09 NOTE — Telephone Encounter (Signed)
11/09/2019 Spoke with patient he requested that I resend [employer] Application to [email or web address] verified email address with patient.  Ambrose Mantle [phone]

## 2019-11-12 ENCOUNTER — Other Ambulatory Visit (HOSPITAL_COMMUNITY)
Admission: RE | Admit: 2019-11-12 | Discharge: 2019-11-12 | Disposition: A | Discharge status: Home / Self Care | Payer: Medicaid Other | Source: Other Acute Inpatient Hospital | Attending: Internal Medicine | Admitting: Internal Medicine

## 2019-11-12 DIAGNOSIS — M869 Osteomyelitis, unspecified: Secondary | ICD-10-CM | POA: Insufficient documentation

## 2019-11-12 LAB — COMPREHENSIVE METABOLIC PANEL
ALT: 29 U/L (ref 0–44)
AST: 30 U/L (ref 15–41)
Albumin: 3.6 g/dL (ref 3.5–5.0)
Alkaline Phosphatase: 143 U/L — ABNORMAL HIGH (ref 38–126)
Anion gap: 11 (ref 5–15)
BUN: 31 mg/dL — ABNORMAL HIGH (ref 6–20)
CO2: 23 mmol/L (ref 22–32)
Calcium: 8.3 mg/dL — ABNORMAL LOW (ref 8.9–10.3)
Chloride: 105 mmol/L (ref 98–111)
Creatinine, Ser: 1.52 mg/dL — ABNORMAL HIGH (ref 0.61–1.24)
GFR calc Af Amer: 58 mL/min — ABNORMAL LOW (ref >60)
GFR calc non Af Amer: 50 mL/min — ABNORMAL LOW (ref >60)
Glucose, Bld: 317 mg/dL — ABNORMAL HIGH (ref 70–99)
Potassium: 4.4 mmol/L (ref 3.5–5.1)
Sodium: 139 mmol/L (ref 135–145)
Total Bilirubin: 0.4 mg/dL (ref 0.3–1.2)
Total Protein: 7.6 g/dL (ref 6.5–8.1)

## 2019-11-12 LAB — CBC WITH DIFFERENTIAL/PLATELET
Abs Immature Granulocytes: 0.02 10*3/uL (ref 0.00–0.07)
Basophils Absolute: 0 10*3/uL (ref 0.0–0.1)
Basophils Relative: 1 %
Eosinophils Absolute: 0.4 10*3/uL (ref 0.0–0.5)
Eosinophils Relative: 5 %
HCT: 40.1 % (ref 39.0–52.0)
Hemoglobin: 12.4 g/dL — ABNORMAL LOW (ref 13.0–17.0)
Immature Granulocytes: 0 %
Lymphocytes Relative: 25 %
Lymphs Abs: 2.1 10*3/uL (ref 0.7–4.0)
MCH: 26.3 pg (ref 26.0–34.0)
MCHC: 30.9 g/dL (ref 30.0–36.0)
MCV: 85.1 fL (ref 80.0–100.0)
Monocytes Absolute: 0.6 10*3/uL (ref 0.1–1.0)
Monocytes Relative: 7 %
Neutro Abs: 5.3 10*3/uL (ref 1.7–7.7)
Neutrophils Relative %: 62 %
Platelets: 146 10*3/uL — ABNORMAL LOW (ref 150–400)
RBC: 4.71 MIL/uL (ref 4.22–5.81)
RDW: 14.9 % (ref 11.5–15.5)
WBC: 8.3 10*3/uL (ref 4.0–10.5)
nRBC: 0 % (ref 0.0–0.2)

## 2019-11-15 ENCOUNTER — Other Ambulatory Visit (HOSPITAL_COMMUNITY)
Admission: RE | Admit: 2019-11-15 | Discharge: 2019-11-15 | Disposition: A | Discharge status: Home / Self Care | Payer: Medicaid Other | Source: Other Acute Inpatient Hospital | Attending: Internal Medicine | Admitting: Internal Medicine

## 2019-11-15 DIAGNOSIS — I2510 Atherosclerotic heart disease of native coronary artery without angina pectoris: Secondary | ICD-10-CM | POA: Insufficient documentation

## 2019-11-15 DIAGNOSIS — I5020 Unspecified systolic (congestive) heart failure: Secondary | ICD-10-CM | POA: Insufficient documentation

## 2019-11-15 LAB — C DIFFICILE QUICK SCREEN W PCR REFLEX
C Diff antigen: NEGATIVE
C Diff interpretation: NOT DETECTED
C Diff toxin: NEGATIVE

## 2019-11-16 ENCOUNTER — Telehealth: Payer: Self-pay

## 2019-11-16 NOTE — Telephone Encounter (Signed)
11/16/2019 Left message on voicemail for patient to return my call regarding [employer] and Medicaid.  Ambrose Mantle [phone]

## 2019-11-19 ENCOUNTER — Other Ambulatory Visit
Admission: RE | Admit: 2019-11-19 | Discharge: 2019-11-19 | Disposition: A | Discharge status: Home / Self Care | Payer: Medicaid Other | Source: Ambulatory Visit | Attending: Internal Medicine | Admitting: Internal Medicine

## 2019-11-19 DIAGNOSIS — M869 Osteomyelitis, unspecified: Secondary | ICD-10-CM | POA: Insufficient documentation

## 2019-11-19 LAB — CBC WITH DIFFERENTIAL/PLATELET
Abs Immature Granulocytes: 0.03 10*3/uL (ref 0.00–0.07)
Basophils Absolute: 0.1 10*3/uL (ref 0.0–0.1)
Basophils Relative: 1 %
Eosinophils Absolute: 0.4 10*3/uL (ref 0.0–0.5)
Eosinophils Relative: 6 %
HCT: 38.7 % — ABNORMAL LOW (ref 39.0–52.0)
Hemoglobin: 12.2 g/dL — ABNORMAL LOW (ref 13.0–17.0)
Immature Granulocytes: 0 %
Lymphocytes Relative: 23 %
Lymphs Abs: 1.7 10*3/uL (ref 0.7–4.0)
MCH: 25.8 pg — ABNORMAL LOW (ref 26.0–34.0)
MCHC: 31.5 g/dL (ref 30.0–36.0)
MCV: 82 fL (ref 80.0–100.0)
Monocytes Absolute: 0.4 10*3/uL (ref 0.1–1.0)
Monocytes Relative: 5 %
Neutro Abs: 4.8 10*3/uL (ref 1.7–7.7)
Neutrophils Relative %: 65 %
Platelets: 133 10*3/uL — ABNORMAL LOW (ref 150–400)
RBC: 4.72 MIL/uL (ref 4.22–5.81)
RDW: 15 % (ref 11.5–15.5)
WBC: 7.3 10*3/uL (ref 4.0–10.5)
nRBC: 0 % (ref 0.0–0.2)

## 2019-11-19 LAB — COMPREHENSIVE METABOLIC PANEL
ALT: 35 U/L (ref 0–44)
AST: 37 U/L (ref 15–41)
Albumin: 3.2 g/dL — ABNORMAL LOW (ref 3.5–5.0)
Alkaline Phosphatase: 115 U/L (ref 38–126)
Anion gap: 11 (ref 5–15)
BUN: 30 mg/dL — ABNORMAL HIGH (ref 6–20)
CO2: 27 mmol/L (ref 22–32)
Calcium: 8.3 mg/dL — ABNORMAL LOW (ref 8.9–10.3)
Chloride: 102 mmol/L (ref 98–111)
Creatinine, Ser: 1.24 mg/dL (ref 0.61–1.24)
GFR calc Af Amer: >60 mL/min (ref >60)
GFR calc non Af Amer: >60 mL/min (ref >60)
Glucose, Bld: 378 mg/dL — ABNORMAL HIGH (ref 70–99)
Potassium: 3.9 mmol/L (ref 3.5–5.1)
Sodium: 140 mmol/L (ref 135–145)
Total Bilirubin: 0.6 mg/dL (ref 0.3–1.2)
Total Protein: 6.9 g/dL (ref 6.5–8.1)

## 2019-11-26 ENCOUNTER — Other Ambulatory Visit (HOSPITAL_COMMUNITY)
Admission: RE | Admit: 2019-11-26 | Discharge: 2019-11-26 | Disposition: A | Discharge status: Home / Self Care | Payer: Medicaid Other | Source: Other Acute Inpatient Hospital | Attending: Internal Medicine | Admitting: Internal Medicine

## 2019-11-26 DIAGNOSIS — M869 Osteomyelitis, unspecified: Secondary | ICD-10-CM | POA: Diagnosis present

## 2019-11-26 LAB — CBC WITH DIFFERENTIAL/PLATELET
Abs Immature Granulocytes: 0.03 10*3/uL (ref 0.00–0.07)
Basophils Absolute: 0.1 10*3/uL (ref 0.0–0.1)
Basophils Relative: 1 %
Eosinophils Absolute: 0.3 10*3/uL (ref 0.0–0.5)
Eosinophils Relative: 4 %
HCT: 38.4 % — ABNORMAL LOW (ref 39.0–52.0)
Hemoglobin: 12.3 g/dL — ABNORMAL LOW (ref 13.0–17.0)
Immature Granulocytes: 0 %
Lymphocytes Relative: 22 %
Lymphs Abs: 1.7 10*3/uL (ref 0.7–4.0)
MCH: 26.7 pg (ref 26.0–34.0)
MCHC: 32 g/dL (ref 30.0–36.0)
MCV: 83.3 fL (ref 80.0–100.0)
Monocytes Absolute: 0.6 10*3/uL (ref 0.1–1.0)
Monocytes Relative: 7 %
Neutro Abs: 5 10*3/uL (ref 1.7–7.7)
Neutrophils Relative %: 66 %
Platelets: 132 10*3/uL — ABNORMAL LOW (ref 150–400)
RBC: 4.61 MIL/uL (ref 4.22–5.81)
RDW: 15.2 % (ref 11.5–15.5)
WBC: 7.7 10*3/uL (ref 4.0–10.5)
nRBC: 0 % (ref 0.0–0.2)

## 2019-11-26 LAB — COMPREHENSIVE METABOLIC PANEL
ALT: 45 U/L — ABNORMAL HIGH (ref 0–44)
AST: 39 U/L (ref 15–41)
Albumin: 3.3 g/dL — ABNORMAL LOW (ref 3.5–5.0)
Alkaline Phosphatase: 116 U/L (ref 38–126)
Anion gap: 10 (ref 5–15)
BUN: 16 mg/dL (ref 6–20)
CO2: 27 mmol/L (ref 22–32)
Calcium: 8.1 mg/dL — ABNORMAL LOW (ref 8.9–10.3)
Chloride: 103 mmol/L (ref 98–111)
Creatinine, Ser: 1.04 mg/dL (ref 0.61–1.24)
GFR calc Af Amer: >60 mL/min (ref >60)
GFR calc non Af Amer: >60 mL/min (ref >60)
Glucose, Bld: 219 mg/dL — ABNORMAL HIGH (ref 70–99)
Potassium: 3.7 mmol/L (ref 3.5–5.1)
Sodium: 140 mmol/L (ref 135–145)
Total Bilirubin: 0.7 mg/dL (ref 0.3–1.2)
Total Protein: 6.9 g/dL (ref 6.5–8.1)

## 2019-11-27 ENCOUNTER — Encounter: Payer: Self-pay | Admitting: Family Medicine

## 2019-11-30 ENCOUNTER — Telehealth: Payer: Self-pay

## 2019-11-30 NOTE — Telephone Encounter (Signed)
11/30/2019 Spoke with patient's daughter Devonne Doughty per chart note.  Patient received application and has filed for Medicaid he is waiting to hear if it has been approved. Designer, television/film set for [employer]  to [place] with her permission.  She said that she would complete the application for her father. She does not think she will need assistance but has my contact information.  [name] [phone]

## 2019-11-30 NOTE — Telephone Encounter (Signed)
Thank you so much for your assistance. 

## 2019-12-04 ENCOUNTER — Other Ambulatory Visit (HOSPITAL_COMMUNITY)
Admission: RE | Admit: 2019-12-04 | Discharge: 2019-12-04 | Disposition: A | Discharge status: Home / Self Care | Payer: Medicaid Other | Source: Other Acute Inpatient Hospital | Attending: Internal Medicine | Admitting: Internal Medicine

## 2019-12-04 DIAGNOSIS — I5020 Unspecified systolic (congestive) heart failure: Secondary | ICD-10-CM | POA: Insufficient documentation

## 2019-12-04 DIAGNOSIS — I2510 Atherosclerotic heart disease of native coronary artery without angina pectoris: Secondary | ICD-10-CM | POA: Insufficient documentation

## 2019-12-04 LAB — CBC WITH DIFFERENTIAL/PLATELET
Abs Immature Granulocytes: 0.01 10*3/uL (ref 0.00–0.07)
Basophils Absolute: 0 10*3/uL (ref 0.0–0.1)
Basophils Relative: 1 %
Eosinophils Absolute: 0.3 10*3/uL (ref 0.0–0.5)
Eosinophils Relative: 5 %
HCT: 36.4 % — ABNORMAL LOW (ref 39.0–52.0)
Hemoglobin: 11.7 g/dL — ABNORMAL LOW (ref 13.0–17.0)
Immature Granulocytes: 0 %
Lymphocytes Relative: 15 %
Lymphs Abs: 0.9 10*3/uL (ref 0.7–4.0)
MCH: 29.3 pg (ref 26.0–34.0)
MCHC: 32.1 g/dL (ref 30.0–36.0)
MCV: 91.2 fL (ref 80.0–100.0)
Monocytes Absolute: 0.6 10*3/uL (ref 0.1–1.0)
Monocytes Relative: 9 %
Neutro Abs: 4.2 10*3/uL (ref 1.7–7.7)
Neutrophils Relative %: 70 %
Platelets: 252 10*3/uL (ref 150–400)
RBC: 3.99 MIL/uL — ABNORMAL LOW (ref 4.22–5.81)
RDW: 14.9 % (ref 11.5–15.5)
WBC: 6 10*3/uL (ref 4.0–10.5)
nRBC: 0 % (ref 0.0–0.2)

## 2019-12-04 LAB — COMPREHENSIVE METABOLIC PANEL
ALT: 15 U/L (ref 0–44)
AST: 29 U/L (ref 15–41)
Albumin: 3.2 g/dL — ABNORMAL LOW (ref 3.5–5.0)
Alkaline Phosphatase: 77 U/L (ref 38–126)
Anion gap: 8 (ref 5–15)
BUN: 15 mg/dL (ref 6–20)
CO2: 23 mmol/L (ref 22–32)
Calcium: 8.3 mg/dL — ABNORMAL LOW (ref 8.9–10.3)
Chloride: 103 mmol/L (ref 98–111)
Creatinine, Ser: 0.62 mg/dL (ref 0.61–1.24)
GFR calc Af Amer: >60 mL/min (ref >60)
GFR calc non Af Amer: >60 mL/min (ref >60)
Glucose, Bld: 91 mg/dL (ref 70–99)
Potassium: 3.8 mmol/L (ref 3.5–5.1)
Sodium: 134 mmol/L — ABNORMAL LOW (ref 135–145)
Total Bilirubin: 0.3 mg/dL (ref 0.3–1.2)
Total Protein: 8.1 g/dL (ref 6.5–8.1)

## 2019-12-14 ENCOUNTER — Telehealth: Payer: Self-pay

## 2019-12-14 NOTE — Telephone Encounter (Signed)
12/14/2019 Spoke with patient's daughter Devonne Doughty.  Assisted her with a few questions she had regarding the [facility].  She is completing the application and will mail it for the patient. Ambrose Mantle [phone]

## 2020-01-01 ENCOUNTER — Ambulatory Visit (INDEPENDENT_AMBULATORY_CARE_PROVIDER_SITE_OTHER): Payer: Self-pay | Admitting: Family Medicine

## 2020-01-01 ENCOUNTER — Encounter: Payer: Self-pay | Admitting: Family Medicine

## 2020-01-01 ENCOUNTER — Other Ambulatory Visit: Payer: Self-pay

## 2020-01-01 VITALS — BP 110/80 | HR 84 | Temp 98.7°F | Ht 66.0 in | Wt 177.1 lb

## 2020-01-01 DIAGNOSIS — G2581 Restless legs syndrome: Secondary | ICD-10-CM

## 2020-01-01 DIAGNOSIS — R142 Eructation: Secondary | ICD-10-CM

## 2020-01-01 DIAGNOSIS — E1165 Type 2 diabetes mellitus with hyperglycemia: Secondary | ICD-10-CM

## 2020-01-01 LAB — POCT GLYCOSYLATED HEMOGLOBIN (HGB A1C): Hemoglobin A1C: 9.8 % — AB (ref 4.0–5.6)

## 2020-01-01 MED ORDER — PRAMIPEXOLE DIHYDROCHLORIDE 0.125 MG PO TABS: 0.1250 mg | ORAL_TABLET | Freq: Every evening | ORAL | 4 refills | Status: DC | PRN

## 2020-01-01 NOTE — Progress Notes (Signed)
Subjective:    Patient ID: [name], male    DOB: [birth year], 59 y.o.   MRN: [id number]  HPI This is a 59 year old male who presents today for follow-up of diabetes type 2 on long-term insulin and hyperglycemia.  [name] had a virtual appointment with endocrinology on 12/26/2019.  According to the note, [name] stopped taking his Metformin in February.  This was due to persistent diarrhea.  At that visit, [name] was taking insulin 70/30 which was increased to 55 units for 1 week.  If blood sugars were running greater than 150, [name] was to increase to 60 units every morning.  [name] was to continue 50 units every evening.  [name] has been checking his blood sugars more frequently and looking at relationships between different foods and elevated glucose (pizza!).  Concern for excessive belching with bad odor. When [name] has this, his blood sugar is high. Can last all week.  They have recommended that [name] be checked for H. pylori and possible SIBO. Worse with eating salad.   Left osteomyelitis- improved.  [name] has continued follow-up with podiatry at [facility].  Left leg syndrome-patient's biggest concern today is his restless leg syndrome and difficulty with sleep.  [name] was seen by Dr. Margarita Mail, neurologist, in 2018.  According to her note, [name] was prescribed a trial of pramipexole for his restless leg syndrome.  [name] had had an extensive work-up.  The patient does not remember taking this or whether or not it was effective.  [name] is willing to try try this for his symptoms.  Diarrhea- stopped metformin and magnesium some improvement in diarrhea. Restarted magnesium supplement with some increase in BM, notices some mucus.   Was denied disability.  [name] just found out prior to arriving for his appointment today.  [name] is understandably upset.  Review of Systems Per HPI    Objective:   Physical Exam Vitals reviewed.  Constitutional:      General: [name] is not in acute distress.    Appearance: Normal appearance. [name] is  normal weight. [name] is not ill-appearing, toxic-appearing or diaphoretic.  HENT:     Head: Normocephalic and atraumatic.  Eyes:     Conjunctiva/sclera: Conjunctivae normal.  Cardiovascular:     Rate and Rhythm: Normal rate.  Pulmonary:     Effort: Pulmonary effort is normal.  Musculoskeletal:     Cervical back: Normal range of motion and neck supple.  Neurological:     Mental Status: [name] is alert and oriented to person, place, and time.  Psychiatric:        Mood and Affect: Mood normal.        Behavior: Behavior normal.        Thought Content: Thought content normal.        Judgment: Judgment normal.       BP 110/80 (BP Location: Left Arm, Patient Position: Sitting, Cuff Size: Normal)   Pulse 84   Temp 98.7 F (37.1 C) (Temporal)   Ht 5\' 6"  (1.676 m)   Wt 177 lb 1.9 oz (80.3 kg)   SpO2 97%   BMI 28.59 kg/m  Wt Readings from Last 3 Encounters:  01/01/20 177 lb 1.9 oz (80.3 kg)  11/06/19 175 lb 6.4 oz (79.6 kg)  10/09/19 189 lb 6.4 oz (85.9 kg)       Assessment & Plan:  1. Uncontrolled type 2 diabetes mellitus with hyperglycemia ([place]) -Slightly improved hemoglobin A1c, still not at goal.  [name] has follow-up with endocrinology  next month. -Encouraged him to continue to work on his diet - HgB A1c  2. Restless leg syndrome -Significantly impacting his ability to rest.  Also discussed importance of good sleep hygiene.  [name] will let me know if no improvement in 2 to 3 weeks and we can increase dose as needed. - pramipexole (MIRAPEX) 0.125 MG tablet; Take 1 tablet (0.125 mg total) by mouth at bedtime as needed.  Dispense: 30 tablet; Refill: 4  3. Belching -Unclear etiology, endocrinology suggested H. pylori testing.  Not sure that this will yield much information, but test was ordered. - Helicobacter pylori special antigen; Future  -[name] can follow-up with me via MyChart.  Encouraged him to continue follow-up with his specialists and follow-up with me in 6 months, sooner if  needed.  Derrick Reamer, FNP-BC  [facility] at [facility], [facility]  01/04/2020 9:14 AM

## 2020-01-01 NOTE — Patient Instructions (Addendum)
Good to see you today  Follow up in 6 months  Blood sugar is improved, continue to work on diet  Work on sleep hygiene, try to go to bed at the same time and get up   I have sent in a medication for your restless legs, let me know if no improvement in 2-3 weeks

## 2020-01-04 ENCOUNTER — Encounter: Payer: Self-pay | Admitting: Family Medicine

## 2020-02-06 HISTORY — PX: AMPUTATION TOE: SHX6595

## 2020-03-01 ENCOUNTER — Encounter: Payer: Self-pay | Admitting: Family Medicine

## 2020-03-02 ENCOUNTER — Other Ambulatory Visit: Payer: Self-pay | Admitting: Family Medicine

## 2020-03-02 DIAGNOSIS — L559 Sunburn, unspecified: Secondary | ICD-10-CM

## 2020-03-02 MED ORDER — SILVER SULFADIAZINE 1 % EX CREA: 1.0000 "application " | TOPICAL_CREAM | Freq: Two times a day (BID) | CUTANEOUS | 0 refills | Status: DC

## 2020-04-23 ENCOUNTER — Other Ambulatory Visit (INDEPENDENT_AMBULATORY_CARE_PROVIDER_SITE_OTHER): Payer: Medicaid Other

## 2020-04-23 DIAGNOSIS — R142 Eructation: Secondary | ICD-10-CM

## 2020-04-25 LAB — HELICOBACTER PYLORI  SPECIAL ANTIGEN
MICRO NUMBER:: 10670318
SPECIMEN QUALITY: ADEQUATE

## 2020-04-26 ENCOUNTER — Other Ambulatory Visit: Payer: Self-pay

## 2020-04-26 ENCOUNTER — Encounter: Payer: Self-pay | Admitting: Family Medicine

## 2020-04-26 ENCOUNTER — Ambulatory Visit (INDEPENDENT_AMBULATORY_CARE_PROVIDER_SITE_OTHER): Payer: Medicaid Other | Admitting: Family Medicine

## 2020-04-26 VITALS — BP 122/70 | HR 75 | Temp 98.1°F | Ht 66.0 in | Wt 179.8 lb

## 2020-04-26 DIAGNOSIS — D649 Anemia, unspecified: Secondary | ICD-10-CM

## 2020-04-26 DIAGNOSIS — E7800 Pure hypercholesterolemia, unspecified: Secondary | ICD-10-CM

## 2020-04-26 DIAGNOSIS — L989 Disorder of the skin and subcutaneous tissue, unspecified: Secondary | ICD-10-CM

## 2020-04-26 DIAGNOSIS — E1165 Type 2 diabetes mellitus with hyperglycemia: Secondary | ICD-10-CM

## 2020-04-26 DIAGNOSIS — I10 Essential (primary) hypertension: Secondary | ICD-10-CM

## 2020-04-26 NOTE — Progress Notes (Signed)
Subjective:    Patient ID: [name], male    DOB: [birth year], 59 y.o.   MRN: [id number]  HPI Chief Complaint  Patient presents with  . Follow-up    referral to dermatology - knot on right forearm   This is a 59 yo male who presents today for above cc.  He has noticed raised, red area right forearm for at least several months.  Has had similar lesion on left forearm.  This was removed by dermatology in the past.  Had to have little toe on left foot amputated 12/2019. Doing ok.   Being followed by cardiology for lightheadedness and frequent falls.  They have put in referral for neurology and he is awaiting call for appointment.  Diabetes mellitus type 2 with hyperglycemia-is currently being followed by endocrinology at [facility].  Had recent visit within the last couple of weeks with adjustment to medication due to hemoglobin A1c greater than 12.  Episodes of belching and malodorous breath.  He reports that he has these once a month.  He has been mentioning these episodes for a while.  He has seen GI.  Unclear etiology.  They occur infrequently and he cannot associate them with any particular food.  He does note that around that time his blood sugar readings are very high, usually up into the 500s.  He has repeatedly been denied disability.  Was finally able to get Medicaid insurance.  He reports that he does have some feelings of depression and lack of worth since he is unable to work.  He lives with his daughter and her family and does feel that he has purpose in his life.  He denies any suicidal ideation. Review of Systems Per HPI    Objective:   Physical Exam Vitals reviewed.  Constitutional:      Appearance: Normal appearance.  HENT:     Head: Normocephalic and atraumatic.  Cardiovascular:     Rate and Rhythm: Normal rate and regular rhythm.     Heart sounds: Normal heart sounds.  Pulmonary:     Effort: Pulmonary effort is normal.     Breath sounds: Normal breath sounds.   Skin:    General: Skin is warm and dry.     Findings: Lesion (right forearm with approximately 7 mm raised, red, irregular lesion. ) present.  Neurological:     Mental Status: He is alert and oriented to person, place, and time.  Psychiatric:        Mood and Affect: Mood normal.        Behavior: Behavior normal.        Thought Content: Thought content normal.        Judgment: Judgment normal.          BP 122/70 (BP Location: Left Arm, Patient Position: Sitting, Cuff Size: Normal)   Pulse 75   Temp 98.1 F (36.7 C) (Temporal)   Ht 5\' 6"  (1.676 m)   Wt 179 lb 12.8 oz (81.6 kg)   SpO2 95%   BMI 29.02 kg/m  Wt Readings from Last 3 Encounters:  04/26/20 179 lb 12.8 oz (81.6 kg)  01/01/20 177 lb 1.9 oz (80.3 kg)  11/06/19 175 lb 6.4 oz (79.6 kg)    Assessment & Plan:  1. Uncontrolled type 2 diabetes mellitus with hyperglycemia (HCC) -Currently being followed by endocrine. -Discussed possible correlation of high blood sugars with malodorous belching.  Have told him to pay careful attention and we will discuss at his follow-up visit -  Encouraged him to watch his diet more carefully 2. Essential hypertension -Well-controlled on current meds -Labs done within the last month  3. Pure hypercholesterolemia -Currently on atorvastatin 80 mg and ezetimibe 10 mg  4. Anemia, unspecified type -Per most recent CBC, will recheck at follow-up in 8 weeks  5. Skin lesion of right arm -Concerning for basal cell - Ambulatory referral to Dermatology  This visit occurred during the SARS-CoV-2 public health emergency.  Safety protocols were in place, including screening questions prior to the visit, additional usage of staff PPE, and extensive cleaning of exam room while observing appropriate contact time as indicated for disinfecting solutions.     Clarene Reamer, FNP-BC  [facility] at [facility], [facility]  04/26/2020 1:49 PM

## 2020-04-26 NOTE — Patient Instructions (Signed)
Good to see you today  Continued follow up with cardiology, neurology

## 2020-05-07 ENCOUNTER — Other Ambulatory Visit: Payer: Self-pay | Admitting: *Deleted

## 2020-05-07 DIAGNOSIS — E1165 Type 2 diabetes mellitus with hyperglycemia: Secondary | ICD-10-CM

## 2020-05-07 MED ORDER — "INSULIN SYRINGE 31G X 5/16"" 0.3 ML MISC": 1.0000 [IU] | Freq: Two times a day (BID) | 2 refills | Status: AC

## 2020-07-03 ENCOUNTER — Encounter: Payer: Self-pay | Admitting: Family Medicine

## 2020-07-03 ENCOUNTER — Ambulatory Visit (INDEPENDENT_AMBULATORY_CARE_PROVIDER_SITE_OTHER): Payer: Medicaid Other | Admitting: Family Medicine

## 2020-07-03 ENCOUNTER — Other Ambulatory Visit: Payer: Self-pay

## 2020-07-03 VITALS — BP 136/74 | HR 80 | Temp 97.6°F | Ht 66.0 in | Wt 173.2 lb

## 2020-07-03 DIAGNOSIS — F17200 Nicotine dependence, unspecified, uncomplicated: Secondary | ICD-10-CM

## 2020-07-03 DIAGNOSIS — F339 Major depressive disorder, recurrent, unspecified: Secondary | ICD-10-CM

## 2020-07-03 DIAGNOSIS — E1165 Type 2 diabetes mellitus with hyperglycemia: Secondary | ICD-10-CM | POA: Diagnosis not present

## 2020-07-03 DIAGNOSIS — I10 Essential (primary) hypertension: Secondary | ICD-10-CM | POA: Diagnosis not present

## 2020-07-03 DIAGNOSIS — I480 Paroxysmal atrial fibrillation: Secondary | ICD-10-CM | POA: Diagnosis not present

## 2020-07-03 LAB — POCT GLYCOSYLATED HEMOGLOBIN (HGB A1C): Hemoglobin A1C: 9 % — AB (ref 4.0–5.6)

## 2020-07-03 NOTE — Telephone Encounter (Signed)
Pharmacy Updated.

## 2020-07-03 NOTE — Progress Notes (Signed)
Subjective:    Patient ID: [name], male    DOB: 27-Apr-1961, 59 y.o.   MRN: [id number]  HPI  Chief Complaint  Patient presents with  . Follow-up    DM   This is a 59 yo male who presents today for follow up of DM type 2. Since last visit, had pharmacist education for his diabetes which was helpful. Currently taking liraglutide, empagliflozin 10 mg, insulin NPH 70/30- 75 units lunch, 45 units dinner. Checking blood sugars 1-2 times a day, numbers in the hundred mostly, rarely in 200s. This helps with his burping. Is seeing endocrinologist beginning of October.   Chronic burping- worse with lettuce, celery, eggs. Always seems to happen with elevated blood sugar readings. Some improvement recently. Wonders if he has gastroparesis. Has seen GI in past, recommended EGD, colonoscopy which he did not have  Energy low, not sure if related to physical or emotional issues. Decreased drive to get things done, feels better when he accomplishes things. Problems with depression, has upcoming appointment with psychiatry, referred by [facility]. Disability claim is currently being reviewed. He would like to go back to work, but doesn't feel that he is physically able to do the type of work he did previously.    Left foot osteomyelitis- 5th toe removed earlier this year, finally has boot off, foot healing.   Afib- followed by cardiology, has upcoming appointment with EP to discuss ablation. Currently on ASA 81 mg daily.   Tob abuse- smoking 1.5 ppd, had low dose CT 06/19/20, negative.   Review of Systems Per HPI, denies headache, visual changes, occasional palpitations, no chest pain, DOE, no abdominal pain/ diarrhea/ constipation, no dysuria/frequency or hematuria.    Objective:   Physical Exam Physical Exam  Constitutional: Oriented to person, place, and time. Appears well-developed and well-nourished.  HENT:  Head: Normocephalic and atraumatic.  Eyes: Conjunctivae are normal.  Neck: Normal range  of motion. Neck supple.  Cardiovascular: Normal rate, regular rhythm and normal heart sounds.   Pulmonary/Chest: Effort normal and breath sounds normal.  Musculoskeletal: No lower extremity edema.   Neurological: Alert and oriented to person, place, and time.  Skin: Skin is warm and dry.  Psychiatric: Normal mood and affect. Behavior is normal. Judgment and thought content normal.  Vitals reviewed.  Diabetic Foot Exam - Simple   Simple Foot Form Diabetic Foot exam was performed with the following findings: Yes 07/03/2020 12:27 PM  Visual Inspection See comments: Yes Sensation Testing Intact to touch and monofilament testing bilaterally: Yes Pulse Check Posterior Tibialis and Dorsalis pulse intact bilaterally: Yes Comments Left fifth digit surgically absent, well healed scar. Hyperpigmentation on both feet. Faint pulses bilaterally.       BP 136/74   Pulse 80   Temp 97.6 F (36.4 C) (Temporal)   Ht 5\' 6"  (1.676 m)   Wt 173 lb 4 oz (78.6 kg)   SpO2 97%   BMI 27.96 kg/m  Wt Readings from Last 3 Encounters:  07/03/20 173 lb 4 oz (78.6 kg)  04/26/20 179 lb 12.8 oz (81.6 kg)  01/01/20 177 lb 1.9 oz (80.3 kg)   Results for orders placed or performed in visit on 07/03/20  HgB A1c  Result Value Ref Range   Hemoglobin A1C 9.0 (A) 4.0 - 5.6 %   HbA1c POC (<> result, manual entry)     HbA1c, POC (prediabetic range)     HbA1c, POC (controlled diabetic range)  Assessment & Plan:  1. Uncontrolled type 2 diabetes mellitus with hyperglycemia (HCC) -Improved, not at goal, has upcoming appointment with endocrinology -He seems more consistent and motivated to check blood sugars and make healthy food choices. - HgB A1c  2. Essential hypertension -Acceptable reading  3. Current every day smoker -Discussed smoking cessation and patient currently not interested in quitting.  Encouraged him to consider at least reducing amount he is smoking  4. PAF (paroxysmal atrial  fibrillation) ([place]) -Continue follow-up with cardiology  5. Depression, recurrent ([place]) -Currently on citalopram 20 mg.  Has upcoming appointment with psychiatry.  Discussed his current stressors, disabilities and complex medical problems  This visit occurred during the SARS-CoV-2 public health emergency.  Safety protocols were in place, including screening questions prior to the visit, additional usage of staff PPE, and extensive cleaning of exam room while observing appropriate contact time as indicated for disinfecting solutions.    Clarene Reamer, FNP-BC  [facility] at [facility], [facility]  07/03/2020 5:47 PM

## 2020-07-03 NOTE — Patient Instructions (Addendum)
Good to see you today  Blood sugar improved, still not at goal, continue with current medications, follow up with endocrinologist.   Youtube videos- Dr. Sharman Cheek, Dr. Enrigue Catena  Follow up in 6 months

## 2020-07-22 ENCOUNTER — Encounter: Payer: Self-pay | Admitting: Family Medicine

## 2020-07-22 ENCOUNTER — Ambulatory Visit (INDEPENDENT_AMBULATORY_CARE_PROVIDER_SITE_OTHER): Payer: Medicaid Other | Admitting: Family Medicine

## 2020-07-22 ENCOUNTER — Other Ambulatory Visit: Payer: Self-pay

## 2020-07-22 VITALS — BP 100/64 | HR 90 | Temp 97.9°F | Ht 66.0 in | Wt 176.5 lb

## 2020-07-22 DIAGNOSIS — E1165 Type 2 diabetes mellitus with hyperglycemia: Secondary | ICD-10-CM

## 2020-07-22 DIAGNOSIS — Z8669 Personal history of other diseases of the nervous system and sense organs: Secondary | ICD-10-CM

## 2020-07-22 DIAGNOSIS — M25511 Pain in right shoulder: Secondary | ICD-10-CM | POA: Diagnosis not present

## 2020-07-22 DIAGNOSIS — S98132A Complete traumatic amputation of one left lesser toe, initial encounter: Secondary | ICD-10-CM

## 2020-07-22 DIAGNOSIS — M25512 Pain in left shoulder: Secondary | ICD-10-CM

## 2020-07-22 DIAGNOSIS — G8929 Other chronic pain: Secondary | ICD-10-CM

## 2020-07-22 NOTE — Patient Instructions (Signed)
Good to see you today  I will place referral for shoulder specialist after I talk with Dr. Lorelei Pont  Your gastric emptying study showed slow emptying of stomach contents

## 2020-07-22 NOTE — Progress Notes (Addendum)
Subjective:    Patient ID: [name], male    DOB: [birth year], 59 y.o.   MRN: [id number]  HPI  Chief Complaint  Patient presents with   Referral    pt states wants referral for surgery.  Bilateral rotator cuff.   Diabetes    Need paper worked filled out and sign for diabetic shoes   This is a 59 year old male, accompanied by his daughter.  Bilateral shoulder pain-this is been a chronic issue for him status post 2 separate injuries, falls.  He was to have rotator cuff repairs many years ago but has been afraid to undergo general anesthesia following a problem in the past.  He reports decreased strength bilaterally, decreased range of motion, pain.  He requests referral to orthopedic surgeon.  Diabetes mellitus type 2-he is followed by endocrine at [facility]  He requests paperwork completion today for diabetic foot shoes.  Last visit 07/09/2020.  At that time, Vania Rea was increased to 25 mg daily.  Endocrinologist ordered gastric emptying study which he had earlier this month.    Review of Systems HPI    Objective:   Physical Exam Vitals reviewed.  Constitutional:      General: He is not in acute distress.    Appearance: Normal appearance. He is normal weight. He is not ill-appearing, toxic-appearing or diaphoretic.  HENT:     Head: Normocephalic and atraumatic.  Eyes:     Conjunctiva/sclera: Conjunctivae normal.  Cardiovascular:     Rate and Rhythm: Normal rate.  Pulmonary:     Effort: Pulmonary effort is normal.  Musculoskeletal:     Right lower leg: No edema.     Left lower leg: No edema.     Comments: Bilateral shoulders with decreased ROM, generalized tenderness.   Skin:    General: Skin is warm and dry.  Neurological:     Mental Status: He is alert and oriented to person, place, and time.  Psychiatric:        Mood and Affect: Mood normal.        Behavior: Behavior normal.        Thought Content: Thought content normal.        Judgment: Judgment  normal.      BP 100/64    Pulse 90    Temp 97.9 F (36.6 C) (Temporal)    Ht 5\' 6"  (1.676 m)    Wt 176 lb 8 oz (80.1 kg)    SpO2 95%    BMI 28.49 kg/m  Wt Readings from Last 3 Encounters:  07/22/20 176 lb 8 oz (80.1 kg)  07/03/20 173 lb 4 oz (78.6 kg)  04/26/20 179 lb 12.8 oz (81.6 kg)   Diabetic Foot Exam - Simple   Simple Foot Form Diabetic Foot exam was performed with the following findings: Yes 07/22/2020  4:00 PM  Visual Inspection Sensation Testing Pulse Check Comments Left 5th toe absent. Well healed scar.          Assessment & Plan:  1. Chronic pain of both shoulders - Ambulatory referral to Orthopedic Surgery  2. Uncontrolled type 2 diabetes mellitus with hyperglycemia ([place]) - continued follow up with endocrinology - forms completed for diabetic footwear  3. Amputation of fifth toe of left foot ([place]) - well healed, continued follow up with podiatry  4. History of peripheral neuropathy - has upcoming appointment with neurology this week  - This visit occurred during the SARS-CoV-2 public health emergency.  Safety protocols were in place,  including screening questions prior to the visit, additional usage of staff PPE, and extensive cleaning of exam room while observing appropriate contact time as indicated for disinfecting solutions.      Clarene Reamer, FNP-BC  [facility] at [facility], [facility]  07/23/2020 7:20 AM   Agree.   Elsie Stain 11:35 AM 07/24/20

## 2020-07-22 NOTE — Telephone Encounter (Signed)
Discussed with patient in office

## 2020-08-02 ENCOUNTER — Encounter: Payer: Self-pay | Admitting: Family Medicine

## 2020-08-14 ENCOUNTER — Telehealth: Payer: Self-pay | Admitting: Orthopaedic Surgery

## 2020-08-14 NOTE — Telephone Encounter (Signed)
Received vm from [employer] w/ [employer] in [place]. She is seeking shoulder records. IC,lmvm [phone] advised we do have 2013 [place] records, advised to call me back with a fax number and I can send those records over.

## 2020-08-15 NOTE — Telephone Encounter (Signed)
Heather called back with fax#. [place] records faxed to [facility] w/ [place] [phone]

## 2020-09-04 DIAGNOSIS — M75122 Complete rotator cuff tear or rupture of left shoulder, not specified as traumatic: Secondary | ICD-10-CM | POA: Insufficient documentation

## 2020-09-04 DIAGNOSIS — M12811 Other specific arthropathies, not elsewhere classified, right shoulder: Secondary | ICD-10-CM | POA: Insufficient documentation

## 2020-10-04 ENCOUNTER — Other Ambulatory Visit: Payer: Self-pay | Admitting: Student

## 2020-10-04 DIAGNOSIS — M75122 Complete rotator cuff tear or rupture of left shoulder, not specified as traumatic: Secondary | ICD-10-CM

## 2020-10-04 DIAGNOSIS — M7582 Other shoulder lesions, left shoulder: Secondary | ICD-10-CM

## 2020-10-04 DIAGNOSIS — M75121 Complete rotator cuff tear or rupture of right shoulder, not specified as traumatic: Secondary | ICD-10-CM

## 2020-10-04 DIAGNOSIS — M7581 Other shoulder lesions, right shoulder: Secondary | ICD-10-CM

## 2020-10-04 DIAGNOSIS — M25511 Pain in right shoulder: Secondary | ICD-10-CM

## 2020-10-04 DIAGNOSIS — M25512 Pain in left shoulder: Secondary | ICD-10-CM

## 2020-10-04 DIAGNOSIS — M12811 Other specific arthropathies, not elsewhere classified, right shoulder: Secondary | ICD-10-CM

## 2020-10-06 ENCOUNTER — Other Ambulatory Visit: Payer: Self-pay

## 2020-10-06 ENCOUNTER — Ambulatory Visit
Admission: RE | Admit: 2020-10-06 | Discharge: 2020-10-06 | Disposition: A | Discharge status: Home / Self Care | Payer: Medicaid Other | Source: Ambulatory Visit | Attending: Student | Admitting: Student

## 2020-10-06 DIAGNOSIS — M7581 Other shoulder lesions, right shoulder: Secondary | ICD-10-CM

## 2020-10-06 DIAGNOSIS — M7582 Other shoulder lesions, left shoulder: Secondary | ICD-10-CM

## 2020-10-06 DIAGNOSIS — M25511 Pain in right shoulder: Secondary | ICD-10-CM | POA: Insufficient documentation

## 2020-10-06 DIAGNOSIS — M75121 Complete rotator cuff tear or rupture of right shoulder, not specified as traumatic: Secondary | ICD-10-CM

## 2020-10-06 DIAGNOSIS — M75122 Complete rotator cuff tear or rupture of left shoulder, not specified as traumatic: Secondary | ICD-10-CM | POA: Diagnosis present

## 2020-10-06 DIAGNOSIS — M12811 Other specific arthropathies, not elsewhere classified, right shoulder: Secondary | ICD-10-CM | POA: Diagnosis present

## 2020-10-06 DIAGNOSIS — M25512 Pain in left shoulder: Secondary | ICD-10-CM

## 2020-10-30 ENCOUNTER — Ambulatory Visit
Admission: EM | Admit: 2020-10-30 | Discharge: 2020-10-30 | Disposition: A | Discharge status: Home / Self Care | Payer: Medicaid Other | Attending: Family Medicine | Admitting: Family Medicine

## 2020-10-30 ENCOUNTER — Other Ambulatory Visit: Payer: Self-pay

## 2020-10-30 DIAGNOSIS — Z20822 Contact with and (suspected) exposure to covid-19: Secondary | ICD-10-CM

## 2020-10-30 DIAGNOSIS — R059 Cough, unspecified: Secondary | ICD-10-CM

## 2020-10-30 NOTE — ED Triage Notes (Addendum)
Pt presents with complaints of headache, productive cough, fatigue and feeling short of breath with mask on x 5 days. Pt denies any other symptoms. Reports mild relief with otc medication. pts granddaughter is positive for covid and he was with her on Monday.

## 2020-10-30 NOTE — Discharge Instructions (Addendum)
Covid test pending We will contact you if your test is positive and refer you to the infusion clinic.  Keep taking the mucinex and tylenol as needed  Follow up as needed for continued or worsening symptoms
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
## 2020-11-02 LAB — SARS-COV-2, NAA 2 DAY TAT

## 2020-11-02 LAB — NOVEL CORONAVIRUS, NAA: SARS-CoV-2, NAA: DETECTED — AB

## 2020-11-03 ENCOUNTER — Telehealth: Payer: Self-pay | Admitting: Physician Assistant
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
## 2020-12-26 ENCOUNTER — Ambulatory Visit (INDEPENDENT_AMBULATORY_CARE_PROVIDER_SITE_OTHER): Payer: Medicaid Other

## 2020-12-26 ENCOUNTER — Other Ambulatory Visit: Payer: Self-pay

## 2020-12-26 ENCOUNTER — Encounter: Payer: Self-pay | Admitting: Sports Medicine

## 2020-12-26 ENCOUNTER — Ambulatory Visit: Payer: Medicaid Other | Admitting: Sports Medicine

## 2020-12-26 DIAGNOSIS — L84 Corns and callosities: Secondary | ICD-10-CM | POA: Diagnosis not present

## 2020-12-26 DIAGNOSIS — E1142 Type 2 diabetes mellitus with diabetic polyneuropathy: Secondary | ICD-10-CM

## 2020-12-26 DIAGNOSIS — M79671 Pain in right foot: Secondary | ICD-10-CM | POA: Diagnosis not present

## 2020-12-26 DIAGNOSIS — M216X1 Other acquired deformities of right foot: Secondary | ICD-10-CM

## 2020-12-26 DIAGNOSIS — M21621 Bunionette of right foot: Secondary | ICD-10-CM
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
## 2020-12-27 ENCOUNTER — Other Ambulatory Visit: Payer: Self-pay | Admitting: Sports Medicine

## 2020-12-27 DIAGNOSIS — M21621 Bunionette of right foot: Secondary | ICD-10-CM

## 2021-01-09 ENCOUNTER — Telehealth (INDEPENDENT_AMBULATORY_CARE_PROVIDER_SITE_OTHER): Payer: Medicaid Other | Admitting: Family Medicine

## 2021-01-09 DIAGNOSIS — R519 Headache, unspecified: Secondary | ICD-10-CM | POA: Diagnosis not present

## 2021-01-09 DIAGNOSIS — R059 Cough, unspecified: Secondary | ICD-10-CM | POA: Diagnosis not present

## 2021-01-09 DIAGNOSIS — R0981 Nasal congestion: Secondary | ICD-10-CM | POA: Diagnosis not present

## 2021-01-09 MED ORDER — DOXYCYCLINE HYCLATE 100 MG PO TABS: 100.0000 mg | ORAL_TABLET | Freq: Two times a day (BID) | ORAL | 0 refills | Status: DC

## 2021-01-09 MED ORDER — BENZONATATE 100 MG PO CAPS: 100.0000 mg | ORAL_CAPSULE | Freq: Three times a day (TID) | ORAL | 0 refills | Status: DC | PRN

## 2021-01-09 NOTE — Patient Instructions (Signed)
-  I sent the medication(s) we discussed to your pharmacy: Meds ordered this encounter  Medications  . doxycycline (VIBRA-TABS) 100 MG tablet    Sig: Take 1 tablet (100 mg total) by mouth 2 (two) times daily.    Dispense:  20 tablet    Refill:  0  . benzonatate (TESSALON PERLES) 100 MG capsule    Sig: Take 1 capsule (100 mg total) by mouth 3 (three) times daily as needed.    Dispense:  20 capsule    Refill:  0     I hope you are feeling better soon!  Seek in person care promptly if your symptoms worsen, new concerns arise or you are not improving with treatment.  It was nice to meet you today. I help [employer] out with telemedicine visits on Tuesdays and Thursdays and am available for visits on those days. If you have any concerns or questions following this visit please schedule a follow up visit with your Primary Care doctor or seek care at a local urgent care clinic to avoid delays in care.

## 2021-01-09 NOTE — Progress Notes (Signed)
Virtual Visit via Video Note  I connected with [name]  on 01/09/21 at  4:20 PM EDT by a video enabled telemedicine application and verified that I am speaking with the correct person using two identifiers.  Location patient: home,  Location provider:work or home office Persons participating in the virtual visit: patient, provider, daughter  I discussed the limitations of evaluation and management by telemedicine and the availability of in person appointments. The patient expressed understanding and agreed to proceed.   HPI:  Acute telemedicine visit for congestion and cough: -Onset: over 2 weeks - not getting better and feels like it is worsening -Symptoms include: nasal congestion, now with thick nasal sinus congestion that is green, some sinus discomfort cough - productive -Denies:CP, SOB, body aches, fevers, inability to eat and drink/get out of bed -Has tried: tylenol cold and flu, musinex -Pertinent past medical history: hx of HTN, heart disease, DM, gastroparesis, and more - see below -Pertinent medication allergies:latex -COVID-19 vaccine status: not vaccinated for flu or covid  ROS: See pertinent positives and negatives per HPI.  Past Medical History:  Diagnosis Date  . Abrasion L FOREARM  . Atrial fibrillation ([place])    a. initially occurring in post-op setting in 05/2016, started on Eliquis b. 08/2016: s/p clipping of atrial appendage at time of CABG --> Eliquis switched to Coumadin  . CAD (coronary artery disease)    a. 05/2016: NSTEMI, cath showing 3v disease with CABG recommended once MRSA infection resolved. b. 08/2016: CABG with LIMA-LAD, SVG-D1, and SVG-RI.  Marland Kitchen Cancer ([place])    basal cell carcinoma of the skin  . Diabetes mellitus without complication ([place])   . Headache(784.0)   . Hematuria, microscopic "SINCE I WAS A KID"  . Hyperlipidemia   . Hypertension   . Ischemic cardiomyopathy    a. 05/2016: echo w/ EF of 25-35% and akinesis of the basal-midinferolateral  and inferior myocardium.  . Lactose intolerance   . Myocardial infarction ([place])   . Respiratory failure ([place])   . Rotator cuff tear RIGHT  . Tobacco use    a. quit in 05/2016 following MI    Past Surgical History:  Procedure Laterality Date  . AMPUTATION TOE Left 02/06/2020   5th ray amputation with graph [facility])  . CARDIAC CATHETERIZATION N/A 06/16/2016   Procedure: Left Heart Cath and Coronary Angiography;  Surgeon: Troy Sine, MD;  Location: [facility];  Service: Cardiovascular;  Laterality: N/A;  . CLIPPING OF ATRIAL APPENDAGE N/A 08/24/2016   Procedure: CLIPPING OF ATRIAL APPENDAGE;  Surgeon: Ivin Poot, MD;  Location: [facility];  Service: Open Heart Surgery;  Laterality: N/A;  . CORONARY ARTERY BYPASS GRAFT N/A 08/24/2016   Procedure: CORONARY ARTERY BYPASS GRAFTING (CABG) x three,  using left internal mammary artery and right leg greater saphenous vein harvested endoscopically;  Surgeon: Ivin Poot, MD;  Location: [facility];  Service: Open Heart Surgery;  Laterality: N/A;  . HERNIA REPAIR  X2  . INCISION AND DRAINAGE ABSCESS Left 06/10/2016   Procedure: INCISION AND DRAINAGE LEFT AXILLARY ABSCESS;  Surgeon: Autumn Messing III, MD;  Location: [facility];  Service: General;  Laterality: Left;  . ROTATOR CUFF REPAIR Bilateral   . TEE WITHOUT CARDIOVERSION N/A 08/24/2016   Procedure: TRANSESOPHAGEAL ECHOCARDIOGRAM (TEE);  Surgeon: Ivin Poot, MD;  Location: [facility];  Service: Open Heart Surgery;  Laterality: N/A;     Current Outpatient Medications:  .  benzonatate (TESSALON PERLES) 100 MG capsule, Take 1 capsule (100 mg  total) by mouth 3 (three) times daily as needed., Disp: 20 capsule, Rfl: 0 .  doxycycline (VIBRA-TABS) 100 MG tablet, Take 1 tablet (100 mg total) by mouth 2 (two) times daily., Disp: 20 tablet, Rfl: 0 .  acetaminophen (TYLENOL) 500 MG tablet, Take 1,000 mg by mouth every 6 (six) hours as needed for headache., Disp: , Rfl:  .  aspirin 81 MG chewable tablet, Chew  1 tablet (81 mg total) by mouth daily., Disp: 30 tablet, Rfl: 3 .  atorvastatin (LIPITOR) 80 MG tablet, Take 1 tablet (80 mg total) by mouth at bedtime., Disp: 90 tablet, Rfl: 3 .  citalopram (CELEXA) 20 MG tablet, Take 1 tablet (20 mg total) by mouth daily., Disp: 90 tablet, Rfl: 3 .  empagliflozin (JARDIANCE) 10 MG TABS tablet, Take 1 tablet by mouth daily., Disp: , Rfl:  .  ezetimibe (ZETIA) 10 MG tablet, Take 1 tablet by mouth daily., Disp: , Rfl:  .  furosemide (LASIX) 20 MG tablet, Take 40 mg by mouth daily. (titrating dose) (Patient not taking: Reported on 07/22/2020), Disp: , Rfl:  .  gabapentin (NEURONTIN) 100 MG capsule, Take 3 capsules by mouth daily., Disp: , Rfl:  .  glucose blood test strip, 1 each by Other route as needed for other. For one touch ultra meter. DX: E11.9, Disp: 100 each, Rfl: 3 .  insulin NPH-regular Human (NOVOLIN 70/30 RELION) (70-30) 100 UNIT/ML injection, Inject 10 Units into the skin 2 (two) times daily with a meal. (Patient taking differently: Take 75 units with breakfast, 45 units with supper), Disp: 10 mL, Rfl: 11 .  Insulin Pen Needle 32G X 4 MM MISC, Use to inject Insulin daily. Dx: E11.9, Disp: 100 each, Rfl: 3 .  Insulin Syringe-Needle U-100 (INSULIN SYRINGE .3CC/31GX5/16") 31G X 5/16" 0.3 ML MISC, 1 Units by Does not apply route 2 (two) times daily., Disp: 100 each, Rfl: 2 .  Lancets (ONETOUCH ULTRASOFT) lancets, Use as instructed (Patient not taking: Reported on 07/22/2020), Disp: 100 each, Rfl: 12 .  liraglutide (VICTOZA) 18 MG/3ML SOPN, Inject 0.6 mLs into the skin daily., Disp: , Rfl:  .  metoprolol succinate (TOPROL-XL) 100 MG 24 hr tablet, Take 1 tablet by mouth daily., Disp: , Rfl:  .  nicotine (NICODERM CQ - DOSED IN MG/24 HOURS) 21 mg/24hr patch, Place onto the skin. (Patient not taking: Reported on 07/22/2020), Disp: , Rfl:  .  nitroGLYCERIN (NITROSTAT) 0.4 MG SL tablet, Place 1 tablet (0.4 mg total) under the tongue every 5 (five) minutes as needed  for chest pain., Disp: 25 tablet, Rfl: 3 .  omeprazole (PRILOSEC) 20 MG capsule, Take by mouth., Disp: , Rfl:  .  pramipexole (MIRAPEX) 0.125 MG tablet, Take 1 tablet (0.125 mg total) by mouth at bedtime as needed., Disp: 30 tablet, Rfl: 4 .  sacubitril-valsartan (ENTRESTO) 24-26 MG, Take 1 tablet by mouth daily., Disp: , Rfl:  .  triamcinolone cream (KENALOG) 0.1 %, Apply topically. AAA every morning at breakfast (Patient not taking: Reported on 07/22/2020), Disp: , Rfl:   EXAM:  VITALS per patient if applicable:  GENERAL: alert, oriented, appears well and in no acute distress  HEENT: atraumatic, conjunttiva clear, no obvious abnormalities on inspection of external nose and ears  NECK: normal movements of the head and neck  LUNGS: on inspection no signs of respiratory distress, breathing rate appears normal, no obvious gross SOB, gasping or wheezing  CV: no obvious cyanosis  MS: moves all visible extremities without noticeable abnormality  PSYCH/NEURO: pleasant  and cooperative, no obvious depression or anxiety, speech and thought processing grossly intact  ASSESSMENT AND PLAN:  Discussed the following assessment and plan:  Nasal congestion  Facial discomfort  Cough  -we discussed possible serious and likely etiologies, options for evaluation and workup, limitations of telemedicine visit vs in person visit, treatment, treatment risks and precautions. Pt prefers to treat via telemedicine empirically rather than in person at this moment.  Query developing sinusitis versus other.  Possibly from viral upper respiratory illness, allergies or Covid.  He opted for treatment doxycycline 100 mg (he prefers this abx as reports has done well with it in the past) twice daily for 10 days along with Tessalon for cough if needed.  Discussed risks and contraindications.  Also advised nasal saline and avoidance of dairy. Scheduled follow up with PCP offered: He agrees to schedule follow-up with his  primary care office if needed. Advised to seek prompt in person care if worsening, new symptoms arise, or if is not improving with treatment. Discussed options for inperson care if PCP office not available. Did let this patient know that I only do telemedicine on Tuesdays and Thursdays for [place]. Advised to schedule follow up visit with PCP or [facility] if any further questions or concerns to avoid delays in care.   I discussed the assessment and treatment plan with the patient. The patient was provided an opportunity to ask questions and all were answered. The patient agreed with the plan and demonstrated an understanding of the instructions.     Lucretia Kern, DO

## 2021-01-10 ENCOUNTER — Other Ambulatory Visit: Payer: Self-pay | Admitting: *Deleted

## 2021-01-10 DIAGNOSIS — F339 Major depressive disorder, recurrent, unspecified: Secondary | ICD-10-CM

## 2021-01-10 NOTE — Telephone Encounter (Signed)
Last office visit 07/22/2020 with D. Carlean Purl chronic bilateral shoulder pain, DM, Amputation of fifth toe left & History of peripheral neuropathy.  Last refilled 10/08/2019 for #90 with 3 refills.  No TOC appointment.

## 2021-01-12 MED ORDER — CITALOPRAM HYDROBROMIDE 20 MG PO TABS: 20.0000 mg | ORAL_TABLET | Freq: Every day | ORAL | 0 refills | Status: DC

## 2021-01-12 NOTE — Telephone Encounter (Signed)
Sent. Thanks.  Needs TOC appointment.

## 2021-03-06 ENCOUNTER — Encounter: Payer: Self-pay | Admitting: Sports Medicine

## 2021-03-06 ENCOUNTER — Other Ambulatory Visit: Payer: Self-pay

## 2021-03-06 ENCOUNTER — Ambulatory Visit: Payer: Medicaid Other | Admitting: Sports Medicine

## 2021-03-06 DIAGNOSIS — B351 Tinea unguium: Secondary | ICD-10-CM | POA: Diagnosis not present

## 2021-03-06 DIAGNOSIS — M79676 Pain in unspecified toe(s): Secondary | ICD-10-CM | POA: Diagnosis not present

## 2021-03-06 DIAGNOSIS — Z89422 Acquired absence of other left toe(s): Secondary | ICD-10-CM

## 2021-03-06 DIAGNOSIS — L84 Corns and callosities: Secondary | ICD-10-CM

## 2021-03-06 DIAGNOSIS — E1142 Type 2 diabetes mellitus with diabetic polyneuropathy: Secondary | ICD-10-CM

## 2021-03-06 DIAGNOSIS — M79609 Pain in unspecified limb: Secondary | ICD-10-CM
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
## 2021-04-02 ENCOUNTER — Encounter: Payer: Self-pay | Admitting: Sports Medicine

## 2021-04-02 ENCOUNTER — Ambulatory Visit: Payer: Medicaid Other | Admitting: Sports Medicine

## 2021-04-02 ENCOUNTER — Other Ambulatory Visit: Payer: Self-pay

## 2021-04-02 ENCOUNTER — Other Ambulatory Visit: Payer: Self-pay | Admitting: Sports Medicine

## 2021-04-02 ENCOUNTER — Ambulatory Visit (INDEPENDENT_AMBULATORY_CARE_PROVIDER_SITE_OTHER): Payer: Medicaid Other

## 2021-04-02 DIAGNOSIS — M79672 Pain in left foot: Secondary | ICD-10-CM

## 2021-04-02 DIAGNOSIS — L97521 Non-pressure chronic ulcer of other part of left foot limited to breakdown of skin: Secondary | ICD-10-CM

## 2021-04-02 DIAGNOSIS — E1142 Type 2 diabetes mellitus with diabetic polyneuropathy: Secondary | ICD-10-CM

## 2021-04-02 DIAGNOSIS — Z89422 Acquired absence of other left toe(s): Secondary | ICD-10-CM

## 2021-04-02 DIAGNOSIS — M2040 Other hammer toe(s) (acquired), unspecified foot: Secondary | ICD-10-CM
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
## 2021-04-09 ENCOUNTER — Other Ambulatory Visit: Payer: Self-pay | Admitting: Sports Medicine

## 2021-04-09 DIAGNOSIS — L97521 Non-pressure chronic ulcer of other part of left foot limited to breakdown of skin: Secondary | ICD-10-CM

## 2021-04-27 ENCOUNTER — Other Ambulatory Visit: Payer: Self-pay | Admitting: Family Medicine

## 2021-04-27 DIAGNOSIS — F339 Major depressive disorder, recurrent, unspecified: Secondary | ICD-10-CM

## 2021-04-28 NOTE — Telephone Encounter (Signed)
Last filled 01-12-21 #90 Last OV with Tor Netters 07-22-20 No Future [place]

## 2021-06-12 ENCOUNTER — Ambulatory Visit (INDEPENDENT_AMBULATORY_CARE_PROVIDER_SITE_OTHER): Payer: Medicaid Other | Admitting: Sports Medicine

## 2021-06-12 ENCOUNTER — Encounter: Payer: Self-pay | Admitting: Sports Medicine

## 2021-06-12 ENCOUNTER — Other Ambulatory Visit: Payer: Self-pay

## 2021-06-12 DIAGNOSIS — M2040 Other hammer toe(s) (acquired), unspecified foot: Secondary | ICD-10-CM

## 2021-06-12 DIAGNOSIS — B351 Tinea unguium: Secondary | ICD-10-CM | POA: Diagnosis not present

## 2021-06-12 DIAGNOSIS — L84 Corns and callosities: Secondary | ICD-10-CM

## 2021-06-12 DIAGNOSIS — E1142 Type 2 diabetes mellitus with diabetic polyneuropathy: Secondary | ICD-10-CM

## 2021-06-12 DIAGNOSIS — M79609 Pain in unspecified limb: Secondary | ICD-10-CM

## 2021-06-12 DIAGNOSIS — Z89422 Acquired absence of other left toe(s): Secondary | ICD-10-CM
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
## 2021-07-09 LAB — HM DIABETES EYE EXAM

## 2021-07-21 LAB — HEMOGLOBIN A1C: Hemoglobin A1C: 9.5

## 2021-07-29 ENCOUNTER — Encounter: Payer: Self-pay | Admitting: Nurse Practitioner

## 2021-07-29 ENCOUNTER — Other Ambulatory Visit: Payer: Self-pay

## 2021-07-29 ENCOUNTER — Ambulatory Visit (INDEPENDENT_AMBULATORY_CARE_PROVIDER_SITE_OTHER): Payer: Medicare Other | Admitting: Nurse Practitioner

## 2021-07-29 VITALS — BP 118/62 | HR 84 | Temp 98.1°F | Resp 12 | Ht 66.0 in | Wt 177.2 lb

## 2021-07-29 DIAGNOSIS — I10 Essential (primary) hypertension: Secondary | ICD-10-CM | POA: Diagnosis not present

## 2021-07-29 DIAGNOSIS — I872 Venous insufficiency (chronic) (peripheral): Secondary | ICD-10-CM

## 2021-07-29 DIAGNOSIS — E1165 Type 2 diabetes mellitus with hyperglycemia: Secondary | ICD-10-CM | POA: Diagnosis not present

## 2021-07-29 DIAGNOSIS — Z720 Tobacco use: Secondary | ICD-10-CM

## 2021-07-29 DIAGNOSIS — E7800 Pure hypercholesterolemia, unspecified: Secondary | ICD-10-CM

## 2021-07-29 DIAGNOSIS — I480 Paroxysmal atrial fibrillation: Secondary | ICD-10-CM

## 2021-07-29 DIAGNOSIS — Z8669 Personal history of other diseases of the nervous system and sense organs: Secondary | ICD-10-CM | POA: Diagnosis not present

## 2021-07-29 DIAGNOSIS — L97211 Non-pressure chronic ulcer of right calf limited to breakdown of skin: Secondary | ICD-10-CM

## 2021-07-29 DIAGNOSIS — M869 Osteomyelitis, unspecified: Secondary | ICD-10-CM

## 2021-07-29 LAB — COMPREHENSIVE METABOLIC PANEL
ALT: 15 U/L (ref 0–53)
AST: 20 U/L (ref 0–37)
Albumin: 4 g/dL (ref 3.5–5.2)
Alkaline Phosphatase: 126 U/L — ABNORMAL HIGH (ref 39–117)
BUN: 23 mg/dL (ref 6–23)
CO2: 27 mEq/L (ref 19–32)
Calcium: 8.8 mg/dL (ref 8.4–10.5)
Chloride: 108 mEq/L (ref 96–112)
Creatinine, Ser: 1.3 mg/dL (ref 0.40–1.50)
GFR: 59.86 mL/min — ABNORMAL LOW (ref >60.00)
Glucose, Bld: 203 mg/dL — ABNORMAL HIGH (ref 70–99)
Potassium: 4.5 mEq/L (ref 3.5–5.1)
Sodium: 144 mEq/L (ref 135–145)
Total Bilirubin: 0.6 mg/dL (ref 0.2–1.2)
Total Protein: 6.7 g/dL (ref 6.0–8.3)

## 2021-07-29 LAB — LIPID PANEL
Cholesterol: 103 mg/dL (ref 0–200)
HDL: 31.8 mg/dL — ABNORMAL LOW (ref >39.00)
LDL Cholesterol: 53 mg/dL (ref 0–99)
NonHDL: 71.09
Total CHOL/HDL Ratio: 3
Triglycerides: 92 mg/dL (ref 0.0–149.0)
VLDL: 18.4 mg/dL (ref 0.0–40.0)

## 2021-07-29 MED ORDER — DOXYCYCLINE HYCLATE 100 MG PO TABS
100.0000 mg | ORAL_TABLET | Freq: Two times a day (BID) | ORAL | 0 refills | Status: DC
Start: 2021-07-29 — End: 2021-10-29

## 2021-07-29 NOTE — Assessment & Plan Note (Signed)
Seems to be due problem for [name] [name] is having blisterlike areas palpable bilateral lower extremities then opening and turning into what looks like a venous stasis ulcer.  We did wrap [name]'s leg with a nonadherent and Kerlix gauze to keep covered.  We will go ahead and start him on doxycycline as a couple of the wounds like it may be cellulitic.  We will do urgent referral to wound clinic for further management.  [name] has intact pulses and no edema.  Continue to monitor watch out for signs and symptoms of infection take antibiotics as prescribed doxycycline 100 mg twice daily

## 2021-07-29 NOTE — Assessment & Plan Note (Signed)
Patient currently maintained on losartan, metoprolol, Entresto for blood pressure.  He is managed by cardiology office.  Continue taking medications as prescribed and follow-up as recommended by cardiologist.  Continue to monitor

## 2021-07-29 NOTE — Patient Instructions (Signed)
Nice to see you Want to see you back in 3 months to make sure you are doing ok. We can do you annual wellness visit then
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
## 2021-07-29 NOTE — Assessment & Plan Note (Signed)
States that her neurology gabapentin 600 mg at bedtime.

## 2021-07-29 NOTE — Assessment & Plan Note (Signed)
Gust this with patient today.  States that he has quit in the past cold [place].  They did attempt to get him on Chantix in the past but insurance would not approve.  Patient states that currently psychiatry is messing with his depression medications and smoking helps him cope with all this.  He has not ready to quit.

## 2021-07-29 NOTE — Assessment & Plan Note (Signed)
Followed by cardiology.  Patient in normal rhythm per auscultation today.  Continue medications inclusive of apixaban

## 2021-07-29 NOTE — Assessment & Plan Note (Signed)
History of this at length partial amputation of fifth toe seems like.  Area looks well-healed no concerns

## 2021-07-29 NOTE — Assessment & Plan Note (Signed)
Currently managed and followed by endocrinology.  Continue checking glucose and taking medications as prescribed per them.

## 2021-07-29 NOTE — Assessment & Plan Note (Signed)
Currently maintained on atorvastatin and ezetimibe.  Pending lab results today.

## 2021-08-01 LAB — CBC WITH DIFFERENTIAL/PLATELET
Basophils Absolute: 0 10*3/uL (ref 0.0–0.1)
Basophils Relative: 0.5 % (ref 0.0–3.0)
Eosinophils Absolute: 0.3 10*3/uL (ref 0.0–0.7)
Eosinophils Relative: 3 % (ref 0.0–5.0)
HCT: 48.4 % (ref 39.0–52.0)
Hemoglobin: 15.9 g/dL (ref 13.0–17.0)
Lymphocytes Relative: 15.5 % (ref 12.0–46.0)
Lymphs Abs: 1.3 10*3/uL (ref 0.7–4.0)
MCHC: 32.8 g/dL (ref 30.0–36.0)
MCV: 86.7 fl (ref 78.0–100.0)
Monocytes Absolute: 0.8 10*3/uL (ref 0.1–1.0)
Monocytes Relative: 9.1 % (ref 3.0–12.0)
Neutro Abs: 6.2 10*3/uL (ref 1.4–7.7)
Neutrophils Relative %: 71.9 % (ref 43.0–77.0)
Platelets: 119 10*3/uL — ABNORMAL LOW (ref 150.0–400.0)
RBC: 5.58 Mil/uL (ref 4.22–5.81)
RDW: 14.9 % (ref 11.5–15.5)
WBC: 8.6 10*3/uL (ref 4.0–10.5)

## 2021-08-06 ENCOUNTER — Encounter: Payer: Medicare Other | Attending: Internal Medicine | Admitting: Internal Medicine

## 2021-08-06 ENCOUNTER — Other Ambulatory Visit: Payer: Self-pay

## 2021-08-06 DIAGNOSIS — E119 Type 2 diabetes mellitus without complications: Secondary | ICD-10-CM | POA: Insufficient documentation

## 2021-08-06 DIAGNOSIS — L97919 Non-pressure chronic ulcer of unspecified part of right lower leg with unspecified severity: Secondary | ICD-10-CM | POA: Insufficient documentation

## 2021-08-06 DIAGNOSIS — I110 Hypertensive heart disease with heart failure: Secondary | ICD-10-CM | POA: Insufficient documentation

## 2021-08-06 DIAGNOSIS — Z951 Presence of aortocoronary bypass graft: Secondary | ICD-10-CM | POA: Diagnosis not present

## 2021-08-06 DIAGNOSIS — I25119 Atherosclerotic heart disease of native coronary artery with unspecified angina pectoris: Secondary | ICD-10-CM | POA: Diagnosis not present

## 2021-08-06 DIAGNOSIS — I5022 Chronic systolic (congestive) heart failure: Secondary | ICD-10-CM | POA: Insufficient documentation

## 2021-08-06 DIAGNOSIS — I252 Old myocardial infarction: Secondary | ICD-10-CM | POA: Insufficient documentation

## 2021-08-06 DIAGNOSIS — Z87891 Personal history of nicotine dependence: Secondary | ICD-10-CM | POA: Insufficient documentation

## 2021-08-06 DIAGNOSIS — L97829 Non-pressure chronic ulcer of other part of left lower leg with unspecified severity: Secondary | ICD-10-CM | POA: Insufficient documentation

## 2021-08-12 ENCOUNTER — Encounter: Payer: Self-pay | Admitting: Ophthalmology

## 2021-08-12 NOTE — Progress Notes (Signed)
DANON, LOGRASSO ([id number]) Visit Report for 08/06/2021 Allergy List Details Patient Name: COBY, SHREWSBERRY. Date of Service: 08/06/2021 12:45 PM Medical Record Number: [id number] Patient Account Number: [id number] Date of Birth/Sex: [birth year] (60 y.o. M) Treating RN: Carlene Coria Primary Care Provider: Karl Ito Other Clinician: Referring Provider: Karl Ito Treating Provider/Extender: Yaakov Guthrie in Treatment: 0 Allergies Active Allergies latex Allergy Notes Electronic Signature(s) Signed: 08/08/2021 4:19:44 PM By: Carlene Coria RN Entered By: Carlene Coria on 08/06/2021 13:10:07 [name] ([id number]) -------------------------------------------------------------------------------- Arrival Information Details Patient Name: KHIAN, REMO. Date of Service: 08/06/2021 12:45 PM Medical Record Number: [id number] Patient Account Number: [id number] Date of Birth/Sex: 05-12-1961 (60 y.o. M) Treating RN: Carlene Coria Primary Care Provider: Karl Ito Other Clinician: Referring Provider: Karl Ito Treating Provider/Extender: Yaakov Guthrie in Treatment: 0 Visit Information Patient Arrived: Ambulatory Arrival Time: 13:07 Accompanied By: daughter Transfer Assistance: None Patient Identification Verified: Yes Secondary Verification Process Completed: Yes Patient Requires Transmission-Based Precautions: No Patient Has Alerts: No Electronic Signature(s) Signed: 08/08/2021 4:19:44 PM By: Carlene Coria RN Entered By: Carlene Coria on 08/06/2021 13:07:40 [name] ([id number]) -------------------------------------------------------------------------------- Clinic Level of Care Assessment Details Patient Name: JALIL, LORUSSO. Date of Service: 08/06/2021 12:45 PM Medical Record Number: [id number] Patient Account Number: [id number] Date of Birth/Sex: [birth year] (60 y.o. M) Treating RN: Carlene Coria Primary Care Provider: Karl Ito Other  Clinician: Referring Provider: Karl Ito Treating Provider/Extender: Yaakov Guthrie in Treatment: 0 Clinic Level of Care Assessment Items TOOL 1 Quantity Score X - Use when EandM and Procedure is performed on INITIAL visit 1 0 ASSESSMENTS - Nursing Assessment / Reassessment X - General Physical Exam (combine w/ comprehensive assessment (listed just below) when performed on new 1 20 pt. evals) X- 1 25 Comprehensive Assessment (HX, ROS, Risk Assessments, Wounds Hx, etc.) ASSESSMENTS - Wound and Skin Assessment / Reassessment [] - Dermatologic / Skin Assessment (not related to wound area) 0 ASSESSMENTS - Ostomy and/or Continence Assessment and Care [] - Incontinence Assessment and Management 0 [] - 0 Ostomy Care Assessment and Management (repouching, etc.) PROCESS - Coordination of Care X - Simple Patient / Family Education for ongoing care 1 15 [] - 0 Complex (extensive) Patient / Family Education for ongoing care X- 1 10 Staff obtains Programmer, systems, Records, Test Results / Process Orders [] - 0 Staff telephones HHA, Nursing Homes / Clarify orders / etc [] - 0 Routine Transfer to another Facility (non-emergent condition) [] - 0 Routine Hospital Admission (non-emergent condition) X- 1 15 New Admissions / Biomedical engineer / Ordering NPWT, Apligraf, etc. [] - 0 Emergency Hospital Admission (emergent condition) PROCESS - Special Needs [] - Pediatric / Minor Patient Management 0 [] - 0 Isolation Patient Management [] - 0 Hearing / Language / Visual special needs [] - 0 Assessment of Community assistance (transportation, D/C planning, etc.) [] - 0 Additional assistance / Altered mentation [] - 0 Support Surface(s) Assessment (bed, cushion, seat, etc.) INTERVENTIONS - Miscellaneous [] - External ear exam 0 [] - 0 Patient Transfer (multiple staff / Civil Service fast streamer / Similar devices) [] - 0 Simple Staple / Suture removal (25 or less) [] - 0 Complex Staple / Suture  removal (26 or more) [] - 0 Hypo/Hyperglycemic Management (do not check if billed separately) X- 1 15 Ankle / Brachial Index (ABI) - do not check if billed separately Has the patient been seen at the hospital within the last three years: Yes Total Score: 100 Level Of Care: New/Established -  Level 3 ARROW, TOMKO ([id number]) Electronic Signature(s) Signed: 08/12/2021 4:18:25 PM By: Carlene Coria RN Entered By: Carlene Coria on 08/11/2021 13:20:59 PRESS, CASALE ([id number]) -------------------------------------------------------------------------------- Encounter Discharge Information Details Patient Name: HARROLD, FITCHETT. Date of Service: 08/06/2021 12:45 PM Medical Record Number: [id number] Patient Account Number: [id number] Date of Birth/Sex: 1961/10/19 (60 y.o. M) Treating RN: Carlene Coria Primary Care Marqueta Pulley: Karl Ito Other Clinician: Referring Maryon Kemnitz: Karl Ito Treating Phillipa Morden/Extender: Yaakov Guthrie in Treatment: 0 Encounter Discharge Information Items Post Procedure Vitals Discharge Condition: Stable Temperature (F): 98 Ambulatory Status: Ambulatory Pulse (bpm): 73 Discharge Destination: Home Respiratory Rate (breaths/min): 18 Transportation: Private Auto Blood Pressure (mmHg): 148/82 Accompanied By: self Schedule Follow-up Appointment: Yes Clinical Summary of Care: Patient Declined Electronic Signature(s) Signed: 08/11/2021 1:19:15 PM By: Carlene Coria RN Entered By: Carlene Coria on 08/11/2021 13:19:15 [name] ([id number]) -------------------------------------------------------------------------------- Lower Extremity Assessment Details Patient Name: RAWLEY, HARJU. Date of Service: 08/06/2021 12:45 PM Medical Record Number: [id number] Patient Account Number: [id number] Date of Birth/Sex: 03-27-1961 (60 y.o. M) Treating RN: Carlene Coria Primary Care Kareema Keitt: Karl Ito Other Clinician: Referring Shekela Goodridge: Karl Ito Treating  Yena Tisby/Extender: Yaakov Guthrie in Treatment: 0 Edema Assessment Assessed: [Left: No] [Right: No] Edema: [Left: Yes] [Right: Yes] Calf Left: Right: Point of Measurement: 32 cm From Medial Instep 35 cm 33 cm Ankle Left: Right: Point of Measurement: 10 cm From Medial Instep 21 cm 24 cm Knee To Floor Left: Right: From Medial Instep 41 cm 41 cm Vascular Assessment Pulses: Dorsalis Pedis Palpable: [Left:Yes] [Right:Yes] Doppler Audible: [Left:Yes] [Right:Yes] Blood Pressure: Brachial: [Left:148] [Right:148] Ankle: [Left:Dorsalis Pedis: 110 0.74] [Right:Dorsalis Pedis: 120 0.81] Electronic Signature(s) Signed: 08/08/2021 4:19:44 PM By: Carlene Coria RN Entered By: Carlene Coria on 08/06/2021 13:50:44 [name] ([id number]) -------------------------------------------------------------------------------- Multi Wound Chart Details Patient Name: [name] Date of Service: 08/06/2021 12:45 PM Medical Record Number: [id number] Patient Account Number: [id number] Date of Birth/Sex: Sep 03, 1961 (60 y.o. M) Treating RN: Carlene Coria Primary Care Siddhanth Denk: Karl Ito Other Clinician: Referring Angelisse Riso: Karl Ito Treating Deshonna Trnka/Extender: Yaakov Guthrie in Treatment: 0 Vital Signs Height(in): 65 Pulse(bpm): 73 Weight(lbs): 180 Blood Pressure(mmHg): 148/82 Body Mass Index(BMI): 30 Temperature(F): 98 Respiratory Rate(breaths/min): 18 Photos: Wound Location: Right, Posterior Lower Leg Right, Medial Lower Leg Right, Distal Lower Leg Wounding Event: Gradually Appeared Gradually Appeared Gradually Appeared Primary Etiology: Diabetic Wound/Ulcer of the Lower Diabetic Wound/Ulcer of the Lower Diabetic Wound/Ulcer of the Lower Extremity Extremity Extremity Comorbid History: Coronary Artery Disease, Coronary Artery Disease, Coronary Artery Disease, Hypertension, Myocardial Infarction, Hypertension, Myocardial Infarction, Hypertension, Myocardial  Infarction, Type II Diabetes Type II Diabetes Type II Diabetes Date Acquired: 07/19/2021 07/19/2021 07/19/2021 Weeks of Treatment: 0 0 0 Wound Status: Open Open Open Measurements L x W x D (cm) 3.5x4x0.1 2.6x1.5x0.1 1.5x0.7x0.2 Area (cm) : 10.996 3.063 0.825 Volume (cm) : 1.1 0.306 0.165 Classification: Grade 2 Grade 2 Grade 2 Exudate Amount: Medium Medium Medium Exudate Type: Serosanguineous Serosanguineous Serosanguineous Exudate Color: red, brown red, brown red, brown Granulation Amount: None Present (0%) None Present (0%) Small (1-33%) Granulation Quality: N/A N/A Pink Necrotic Amount: Large (67-100%) Large (67-100%) Large (67-100%) Necrotic Tissue: Eschar, Adherent [place] Exposed Structures: Fat Layer (Subcutaneous Tissue): Fascia: No Fat Layer (Subcutaneous Tissue): Yes Fat Layer (Subcutaneous Tissue): Yes Fascia: No No Fascia: No Tendon: No Tendon: No Tendon: No Muscle: No Muscle: No Muscle: No Joint: No Joint: No Joint: No Bone: No Bone: No Bone: No Epithelialization: None None None Debridement:  Chemical/Enzymatic/Mechanical Chemical/Enzymatic/Mechanical Chemical/Enzymatic/Mechanical Pre-procedure Verification/Time 14:00 14:00 14:00 Out Taken: Instrument: Other(saline gauze) Other(saline gauze) Other(saline gauze) Bleeding: Minimum Minimum Minimum Hemostasis Achieved: Pressure Pressure Pressure Procedural Pain: 0 0 0 Post Procedural Pain: 0 0 0 Debridement Treatment Procedure was tolerated well Procedure was tolerated well Procedure was tolerated well Response: Post Debridement 3.5x4x0.1 2.6x1.5x0.1 1.5x0.7x0.2 Measurements L x W x D (cm) Post Debridement Volume: 1.1 0.306 0.165 (cm) DREQUAN, IRONSIDE ([id number]) Procedures Performed: Debridement Debridement Debridement Wound Number: 4 5 N/A Photos: N/A Wound Location: Right, Lateral Lower Leg Left, Medial Lower Leg N/A Wounding Event: Gradually Appeared  Gradually Appeared N/A Primary Etiology: Diabetic Wound/Ulcer of the Lower Diabetic Wound/Ulcer of the Lower N/A Extremity Extremity Comorbid History: Coronary Artery Disease, Coronary Artery Disease, N/A Hypertension, Myocardial Infarction, Hypertension, Myocardial Infarction, Type II Diabetes Type II Diabetes Date Acquired: 07/19/2021 07/19/2021 N/A Weeks of Treatment: 0 0 N/A Wound Status: Open Open N/A Measurements L x W x D (cm) 1.5x0.6x0.1 1x1x0.1 N/A Area (cm) : 0.707 0.785 N/A Volume (cm) : 0.071 0.079 N/A Classification: Grade 2 Grade 2 N/A Exudate Amount: Medium Medium N/A Exudate Type: Serosanguineous Serosanguineous N/A Exudate Color: red, brown red, brown N/A Granulation Amount: None Present (0%) None Present (0%) N/A Granulation Quality: N/A N/A N/A Necrotic Amount: Large (67-100%) Large (67-100%) N/A Necrotic Tissue: Eschar, Adherent [place] N/A Exposed Structures: Fascia: No Fascia: No N/A Fat Layer (Subcutaneous Tissue): Fat Layer (Subcutaneous Tissue): No No Tendon: No Tendon: No Muscle: No Muscle: No Joint: No Joint: No Bone: No Bone: No Epithelialization: None None N/A Debridement: Chemical/Enzymatic/Mechanical Chemical/Enzymatic/Mechanical N/A Pre-procedure Verification/Time 14:00 14:00 N/A Out Taken: Instrument: Other(saline gauze) Other(saline gauze) N/A Bleeding: Minimum Minimum N/A Hemostasis Achieved: Pressure Pressure N/A Procedural Pain: 0 0 N/A Post Procedural Pain: 0 0 N/A Debridement Treatment Procedure was tolerated well Procedure was tolerated well N/A Response: Post Debridement 1.5x0.6x0.1 1x1x0.1 N/A Measurements L x W x D (cm) Post Debridement Volume: 0.071 0.079 N/A (cm) Procedures Performed: Debridement Debridement N/A Treatment Notes Electronic Signature(s) Signed: 08/06/2021 4:30:49 PM By: Kalman Shan DO Entered By: Kalman Shan on 08/06/2021 14:17:08 [name]  ([id number]) -------------------------------------------------------------------------------- [place] Details Patient Name: MITCHAEL, LUCKEY. Date of Service: 08/06/2021 12:45 PM Medical Record Number: [id number] Patient Account Number: [id number] Date of Birth/Sex: November 21, 1960 (60 y.o. M) Treating RN: Carlene Coria Primary Care Saren Corkern: Karl Ito Other Clinician: Referring Rye Dorado: Karl Ito Treating Lloyd Cullinan/Extender: Yaakov Guthrie in Treatment: 0 Active Inactive Wound/Skin Impairment Nursing Diagnoses: Knowledge deficit related to ulceration/compromised skin integrity Goals: Patient/caregiver will verbalize understanding of skin care regimen Date Initiated: 08/06/2021 Target Resolution Date: 09/06/2021 Goal Status: Active Ulcer/skin breakdown will have a volume reduction of 30% by week 4 Date Initiated: 08/06/2021 Target Resolution Date: 10/06/2021 Goal Status: Active Ulcer/skin breakdown will have a volume reduction of 50% by week 8 Date Initiated: 08/06/2021 Target Resolution Date: 11/06/2021 Goal Status: Active Ulcer/skin breakdown will have a volume reduction of 80% by week 12 Date Initiated: 08/06/2021 Target Resolution Date: 12/07/2021 Goal Status: Active Ulcer/skin breakdown will heal within 14 weeks Date Initiated: 08/06/2021 Target Resolution Date: 01/04/2022 Goal Status: Active Interventions: Assess patient/caregiver ability to obtain necessary supplies Assess patient/caregiver ability to perform ulcer/skin care regimen upon admission and as needed Assess ulceration(s) every visit Notes: Electronic Signature(s) Signed: 08/08/2021 4:19:44 PM By: Carlene Coria RN Entered By: Carlene Coria on 08/06/2021 14:01:41 [name] ([id number]) -------------------------------------------------------------------------------- Pain Assessment Details Patient Name: JAQUISE, FAUX. Date of Service: 08/06/2021 12:45 PM Medical Record  Number: [id number] Patient Account Number: [id number] Date of Birth/Sex: July 25, 1961 (60 y.o. M) Treating RN: Carlene Coria Primary Care Lyrique Hakim: Karl Ito Other Clinician: Referring Cedar Ditullio: Karl Ito Treating Joban Colledge/Extender: Yaakov Guthrie in Treatment: 0 Active Problems Location of Pain Severity and Description of Pain Patient Has Paino Yes Site Locations With Dressing Change: Yes Duration of the Pain. Constant / Intermittento Intermittent How Long Does it Lasto Hours: Minutes: 15 Rate the pain. Current Pain Level: 2 Worst Pain Level: 5 Least Pain Level: 0 Tolerable Pain Level: 5 Character of Pain Describe the Pain: Burning Pain Management and Medication Current Pain Management: Medication: Yes Cold Application: No Rest: Yes Massage: No Activity: No T.E.N.S.: No Heat Application: No Leg drop or elevation: No Is the Current Pain Management Adequate: Inadequate How does your wound impact your activities of daily livingo Sleep: No Bathing: No Appetite: No Relationship With Others: No Bladder Continence: No Emotions: No Bowel Continence: No Work: No Toileting: No Drive: No Dressing: No Hobbies: No Electronic Signature(s) Signed: 08/08/2021 4:19:44 PM By: Carlene Coria RN Entered By: Carlene Coria on 08/06/2021 13:08:44 [name] ([id number]) -------------------------------------------------------------------------------- Patient/Caregiver Education Details Patient Name: LEAVY, HEATHERLY. Date of Service: 08/06/2021 12:45 PM Medical Record Number: [id number] Patient Account Number: [id number] Date of Birth/Gender: [birth year] (60 y.o. M) Treating RN: Carlene Coria Primary Care Physician: Karl Ito Other Clinician: Referring Physician: Karl Ito Treating Physician/Extender: Yaakov Guthrie in Treatment: 0 Education Assessment Education Provided To: Patient Education Topics Provided Wound/Skin Impairment: Methods:  Explain/Verbal Responses: State content correctly Electronic Signature(s) Signed: 08/12/2021 4:18:25 PM By: Carlene Coria RN Entered By: Carlene Coria on 08/11/2021 13:17:45 [name] ([id number]) -------------------------------------------------------------------------------- Wound Assessment Details Patient Name: KHAMRON, GELLERT. Date of Service: 08/06/2021 12:45 PM Medical Record Number: [id number] Patient Account Number: [id number] Date of Birth/Sex: January 20, 1961 (60 y.o. M) Treating RN: Carlene Coria Primary Care Rance Smithson: Karl Ito Other Clinician: Referring Terree Gaultney: Karl Ito Treating Matika Bartell/Extender: Yaakov Guthrie in Treatment: 0 Wound Status Wound Number: 1 Primary Diabetic Wound/Ulcer of the Lower Extremity Etiology: Wound Location: Right, Posterior Lower Leg Wound Status: Open Wounding Event: Gradually Appeared Comorbid Coronary Artery Disease, Hypertension, Myocardial Date Acquired: 07/19/2021 History: Infarction, Type II Diabetes Weeks Of Treatment: 0 Clustered Wound: No Photos Wound Measurements Length: (cm) 3.5 Width: (cm) 4 Depth: (cm) 0.1 Area: (cm) 10.996 Volume: (cm) 1.1 % Reduction in Area: % Reduction in Volume: Epithelialization: None Tunneling: No Undermining: No Wound Description Classification: Grade 2 Exudate Amount: Medium Exudate Type: Serosanguineous Exudate Color: red, brown Foul Odor After Cleansing: No Slough/Fibrino Yes Wound Bed Granulation Amount: None Present (0%) Exposed Structure Necrotic Amount: Large (67-100%) Fascia Exposed: No Necrotic Quality: Eschar, Adherent Slough Fat Layer (Subcutaneous Tissue) Exposed: Yes Tendon Exposed: No Muscle Exposed: No Joint Exposed: No Bone Exposed: No Treatment Notes Wound #1 (Lower Leg) Wound Laterality: Right, Posterior Cleanser [place] Ancillary Kit - 15 Day Supply Discharge Instruction: Use supplies as instructed; Kit contains: (15) Saline Bullets; (15) 3x3  Gauze; 15 pr Gloves Soap and Water Discharge Instruction: Gently cleanse wound with antibacterial soap, rinse and pat dry prior to dressing wounds AMEAR, STROJNY ([id number]) Peri-Wound Care Topical Primary Dressing Gauze Discharge Instruction: As directed: dry, moistened with saline or moistened with Dakins Solution Santyl Collagenase Ointment, 30 (gm), tube Secondary Dressing Kerlix 4.5 x 4.1 (in/yd) Discharge Instruction: Apply Kerlix 4.5 x 4.1 (in/yd) as instructed Secured With 15M Medipore H Soft Cloth Surgical Tape, 2x2 (in/yd) Compression Wrap Compression Stockings Add-Ons Electronic Signature(s) Signed: 08/08/2021 4:19:44 PM  By: Carlene Coria RN Entered By: Carlene Coria on 08/06/2021 13:44:52 [name] ([id number]) -------------------------------------------------------------------------------- Wound Assessment Details Patient Name: BONNIE, ROIG. Date of Service: 08/06/2021 12:45 PM Medical Record Number: [id number] Patient Account Number: [id number] Date of Birth/Sex: [birth year] (60 y.o. M) Treating RN: Carlene Coria Primary Care Provider: Karl Ito Other Clinician: Referring Provider: Karl Ito Treating Provider/Extender: Yaakov Guthrie in Treatment: 0 Wound Status Wound Number: 2 Primary Diabetic Wound/Ulcer of the Lower Extremity Etiology: Wound Location: Right, Medial Lower Leg Wound Status: Open Wounding Event: Gradually Appeared Comorbid Coronary Artery Disease, Hypertension, Myocardial Date Acquired: 07/19/2021 History: Infarction, Type II Diabetes Weeks Of Treatment: 0 Clustered Wound: No Photos Wound Measurements Length: (cm) 2.6 Width: (cm) 1.5 Depth: (cm) 0.1 Area: (cm) 3.063 Volume: (cm) 0.306 % Reduction in Area: % Reduction in Volume: Epithelialization: None Tunneling: No Undermining: No Wound Description Classification: Grade 2 Exudate Amount: Medium Exudate Type: Serosanguineous Exudate Color: red, brown Foul  Odor After Cleansing: No Slough/Fibrino Yes Wound Bed Granulation Amount: None Present (0%) Exposed Structure Necrotic Amount: Large (67-100%) Fascia Exposed: No Necrotic Quality: Eschar, Adherent Sl[place] Fat Layer (Subcutaneous Tissue) Exposed: No Tendon Exposed: No Muscle Exposed: No Joint Exposed: No Bone Exposed: No Treatment Notes Wound #2 (Lower Leg) Wound Laterality: Right, Medial Cleanser [place] Ancillary Kit - 15 Day Supply Discharge Instruction: Use supplies as instructed; Kit contains: (15) Saline Bullets; (15) 3x3 Gauze; 15 pr Gloves Soap and Water Discharge Instruction: Gently cleanse wound with antibacterial soap, rinse and pat dry prior to dressing wounds BOEN, STERBENZ ([id number]) Peri-Wound Care Topical Primary Dressing Gauze Discharge Instruction: As directed: dry, moistened with saline or moistened with Dakins Solution Santyl Collagenase Ointment, 30 (gm), tube Secondary Dressing Kerlix 4.5 x 4.1 (in/yd) Discharge Instruction: Apply Kerlix 4.5 x 4.1 (in/yd) as instructed Secured With 50M Medipore H Soft Cloth Surgical Tape, 2x2 (in/yd) Compression Wrap Compression Stockings Add-Ons Electronic Signature(s) Signed: 08/08/2021 4:19:44 PM By: Carlene Coria RN Entered By: Carlene Coria on 08/06/2021 13:46:00 [name] ([id number]) -------------------------------------------------------------------------------- Wound Assessment Details Patient Name: RUDOLPH, DAOUST. Date of Service: 08/06/2021 12:45 PM Medical Record Number: [id number] Patient Account Number: [id number] Date of Birth/Sex: May 12, 1961 (60 y.o. M) Treating RN: Carlene Coria Primary Care Provider: Karl Ito Other Clinician: Referring Provider: Karl Ito Treating Provider/Extender: Yaakov Guthrie in Treatment: 0 Wound Status Wound Number: 3 Primary Diabetic Wound/Ulcer of the Lower Extremity Etiology: Wound Location: Right, Distal Lower Leg Wound Status: Open Wounding Event:  Gradually Appeared Comorbid Coronary Artery Disease, Hypertension, Myocardial Date Acquired: 07/19/2021 History: Infarction, Type II Diabetes Weeks Of Treatment: 0 Clustered Wound: No Photos Wound Measurements Length: (cm) 1.5 Width: (cm) 0.7 Depth: (cm) 0.2 Area: (cm) 0.825 Volume: (cm) 0.165 % Reduction in Area: % Reduction in Volume: Epithelialization: None Tunneling: No Undermining: No Wound Description Classification: Grade 2 Exudate Amount: Medium Exudate Type: Serosanguineous Exudate Color: red, brown Foul Odor After Cleansing: No Slough/Fibrino Yes Wound Bed Granulation Amount: Small (1-33%) Exposed Structure Granulation Quality: Pink Fascia Exposed: No Necrotic Amount: Large (67-100%) Fat Layer (Subcutaneous Tissue) Exposed: Yes Necrotic Quality: Eschar, Adherent Slough Tendon Exposed: No Muscle Exposed: No Joint Exposed: No Bone Exposed: No Treatment Notes Wound #3 (Lower Leg) Wound Laterality: Right, Distal Cleanser [place] Ancillary Kit - 15 Day Supply Discharge Instruction: Use supplies as instructed; Kit contains: (15) Saline Bullets; (15) 3x3 Gauze; 15 pr Gloves Soap and Water Discharge Instruction: Gently cleanse wound with antibacterial soap, rinse and pat dry prior to dressing wounds DARTANYON, FRANKOWSKI ([id number])  Peri-Wound Care Topical Primary Dressing Gauze Discharge Instruction: As directed: dry, moistened with saline or moistened with Dakins Solution Santyl Collagenase Ointment, 30 (gm), tube Secondary Dressing Kerlix 4.5 x 4.1 (in/yd) Discharge Instruction: Apply Kerlix 4.5 x 4.1 (in/yd) as instructed Secured With 318M [place] Surgical Tape, 2x2 (in/yd) Compression Wrap Compression Stockings Add-Ons Electronic Signature(s) Signed: 08/08/2021 4:19:44 PM By: Carlene Coria RN Entered By: Carlene Coria on 08/06/2021 13:47:12 [name]  ([id number]) -------------------------------------------------------------------------------- Wound Assessment Details Patient Name: SEMAJE, KINKER. Date of Service: 08/06/2021 12:45 PM Medical Record Number: [id number] Patient Account Number: [id number] Date of Birth/Sex: 1961/05/14 (60 y.o. M) Treating RN: Carlene Coria Primary Care Bolivar Koranda: Karl Ito Other Clinician: Referring Kaianna Dolezal: Karl Ito Treating Raahi Korber/Extender: Yaakov Guthrie in Treatment: 0 Wound Status Wound Number: 4 Primary Diabetic Wound/Ulcer of the Lower Extremity Etiology: Wound Location: Right, Lateral Lower Leg Wound Status: Open Wounding Event: Gradually Appeared Comorbid Coronary Artery Disease, Hypertension, Myocardial Date Acquired: 07/19/2021 History: Infarction, Type II Diabetes Weeks Of Treatment: 0 Clustered Wound: No Photos Wound Measurements Length: (cm) 1.5 Width: (cm) 0.6 Depth: (cm) 0.1 Area: (cm) 0.707 Volume: (cm) 0.071 % Reduction in Area: % Reduction in Volume: Epithelialization: None Tunneling: No Undermining: No Wound Description Classification: Grade 2 Exudate Amount: Medium Exudate Type: Serosanguineous Exudate Color: red, brown Foul Odor After Cleansing: No Slough/Fibrino Yes Wound Bed Granulation Amount: None Present (0%) Exposed Structure Necrotic Amount: Large (67-100%) Fascia Exposed: No Necrotic Quality: Eschar, Adherent Slough Fat Layer (Subcutaneous Tissue) Exposed: No Tendon Exposed: No Muscle Exposed: No Joint Exposed: No Bone Exposed: No Treatment Notes Wound #4 (Lower Leg) Wound Laterality: Right, Lateral [facility] Ancillary Kit - 15 Day Supply Discharge Instruction: Use supplies as instructed; Kit contains: (15) Saline Bullets; (15) 3x3 Gauze; 15 pr Gloves Soap and Water Discharge Instruction: Gently cleanse wound with antibacterial soap, rinse and pat dry prior to dressing wounds ARMIN, YERGER ([id number]) Peri-Wound  Care Topical Primary Dressing Gauze Discharge Instruction: As directed: dry, moistened with saline or moistened with Dakins Solution Santyl Collagenase Ointment, 30 (gm), tube Secondary Dressing Kerlix 4.5 x 4.1 (in/yd) Discharge Instruction: Apply Kerlix 4.5 x 4.1 (in/yd) as instructed Secured With 318M Medipore H Soft Cloth Surgical Tape, 2x2 (in/yd) Compression Wrap Compression Stockings Add-Ons Electronic Signature(s) Signed: 08/08/2021 4:19:44 PM By: Carlene Coria RN Entered By: Carlene Coria on 08/06/2021 13:48:20 [name] ([id number]) -------------------------------------------------------------------------------- Wound Assessment Details Patient Name: KINGSTIN, HEIMS. Date of Service: 08/06/2021 12:45 PM Medical Record Number: [id number] Patient Account Number: [id number] Date of Birth/Sex: 06-28-1961 (60 y.o. M) Treating RN: Carlene Coria Primary Care Taahir Grisby: Karl Ito Other Clinician: Referring Abiha Lukehart: Karl Ito Treating Marili Vader/Extender: Yaakov Guthrie in Treatment: 0 Wound Status Wound Number: 5 Primary Diabetic Wound/Ulcer of the Lower Extremity Etiology: Wound Location: Left, Medial Lower Leg Wound Status: Open Wounding Event: Gradually Appeared Comorbid Coronary Artery Disease, Hypertension, Myocardial Date Acquired: 07/19/2021 History: Infarction, Type II Diabetes Weeks Of Treatment: 0 Clustered Wound: No Photos Wound Measurements Length: (cm) 1 Width: (cm) 1 Depth: (cm) 0.1 Area: (cm) 0.785 Volume: (cm) 0.079 % Reduction in Area: % Reduction in Volume: Epithelialization: None Tunneling: No Undermining: No Wound Description Classification: Grade 2 Exudate Amount: Medium Exudate Type: Serosanguineous Exudate Color: red, brown Foul Odor After Cleansing: No Slough/Fibrino Yes Wound Bed Granulation Amount: None Present (0%) Exposed Structure Necrotic Amount: Large (67-100%) Fascia Exposed: No Necrotic Quality: Eschar,  Adherent Slough Fat Layer (Subcutaneous Tissue) Exposed: No Tendon Exposed: No Muscle Exposed: No Joint  Exposed: No Bone Exposed: No Treatment Notes Wound #5 (Lower Leg) Wound Laterality: Left, Medial Cleanser [facility] Ancillary Kit - 15 Day Supply Discharge Instruction: Use supplies as instructed; Kit contains: (15) Saline Bullets; (15) 3x3 Gauze; 15 pr Gloves Soap and Water Discharge Instruction: Gently cleanse wound with antibacterial soap, rinse and pat dry prior to dressing wounds MASTON, WIGHT ([id number]) Peri-Wound Care Topical Primary Dressing Gauze Discharge Instruction: As directed: dry, moistened with saline or moistened with Dakins Solution Santyl Collagenase Ointment, 30 (gm), tube Secondary Dressing Kerlix 4.5 x 4.1 (in/yd) Discharge Instruction: Apply Kerlix 4.5 x 4.1 (in/yd) as instructed Secured With 9M Medipore H Soft Cloth Surgical Tape, 2x2 (in/yd) Compression Wrap Compression Stockings Add-Ons Electronic Signature(s) Signed: 08/08/2021 4:19:44 PM By: Carlene Coria RN Entered By: Carlene Coria on 08/06/2021 13:49:28 [name] ([id number]) -------------------------------------------------------------------------------- [place] Details Patient Name: [name] Date of Service: 08/06/2021 12:45 PM Medical Record Number: [id number] Patient Account Number: [id number] Date of Birth/Sex: 1961-05-03 (60 y.o. M) Treating RN: Carlene Coria Primary Care Ximena Todaro: Karl Ito Other Clinician: Referring Armoni Kludt: Karl Ito Treating Kannen Moxey/Extender: Yaakov Guthrie in Treatment: 0 Vital Signs Time Taken: 13:08 Temperature (F): 98 Height (in): 65 Pulse (bpm): 73 Source: Stated Respiratory Rate (breaths/min): 18 Weight (lbs): 180 Blood Pressure (mmHg): 148/82 Source: Measured Reference Range: 80 - 120 mg / dl Body Mass Index (BMI): 30 Electronic Signature(s) Signed: 08/08/2021 4:19:44 PM By: Carlene Coria RN Entered By: Carlene Coria on  08/06/2021 13:09:11

## 2021-08-13 ENCOUNTER — Other Ambulatory Visit: Payer: Self-pay

## 2021-08-13 ENCOUNTER — Encounter (HOSPITAL_BASED_OUTPATIENT_CLINIC_OR_DEPARTMENT_OTHER): Payer: Medicare Other | Admitting: Internal Medicine

## 2021-08-13 DIAGNOSIS — L97829 Non-pressure chronic ulcer of other part of left lower leg with unspecified severity: Secondary | ICD-10-CM | POA: Diagnosis not present

## 2021-08-13 DIAGNOSIS — L97919 Non-pressure chronic ulcer of unspecified part of right lower leg with unspecified severity: Secondary | ICD-10-CM

## 2021-08-13 NOTE — Progress Notes (Signed)
CORDIE, BUENING ([id number]) Visit Report for 08/13/2021 Chief Complaint Document Details Patient Name: [name], [name]. Date of Service: 08/13/2021 9:00 AM Medical Record Number: [id number] Patient Account Number: [id number] Date of Birth/Sex: 1961-05-14 (60 y.o. M) Treating RN: Donnamarie Poag Primary Care Provider: Karl Ito Other Clinician: Referring Provider: Karl Ito Treating Provider/Extender: Yaakov Guthrie in Treatment: 1 Information Obtained from: Patient Chief Complaint Bilateral lower extremity wounds Electronic Signature(s) Signed: 08/13/2021 10:59:15 AM By: Kalman Shan DO Entered By: Kalman Shan on 08/13/2021 10:54:02 [name] ([id number]) -------------------------------------------------------------------------------- Debridement Details Patient Name: [name], [name]. Date of Service: 08/13/2021 9:00 AM Medical Record Number: [id number] Patient Account Number: [id number] Date of Birth/Sex: 08/29/1961 (60 y.o. M) Treating RN: Donnamarie Poag Primary Care Provider: Karl Ito Other Clinician: Referring Provider: Karl Ito Treating Provider/Extender: Yaakov Guthrie in Treatment: 1 Debridement Performed for Wound #5 Left,Medial Lower Leg Assessment: Performed By: Physician Kalman Shan, MD Debridement Type: Debridement Severity of Tissue Pre Debridement: Fat layer exposed Level of Consciousness (Pre- Awake and Alert procedure): Pre-procedure Verification/Time Out Yes - 09:56 Taken: Start Time: 09:56 Pain Control: Lidocaine Total Area Debrided (L x W): 0.5 (cm) x 0.5 (cm) = 0.25 (cm) Tissue and other material Viable, Non-Viable, Eschar, Skin: Dermis debrided: Level: Skin/Dermis Debridement Description: Selective/Open Wound Instrument: Blade, Forceps, Scissors Bleeding: Minimum Hemostasis Achieved: Pressure Response to Treatment: Procedure was tolerated well Level of Consciousness (Post- Awake and  Alert procedure): Post Debridement Measurements of Total Wound Length: (cm) 1 Width: (cm) 1.2 Depth: (cm) 0.1 Volume: (cm) 0.094 Character of Wound/Ulcer Post Debridement: Improved Severity of Tissue Post Debridement: Fat layer exposed Post Procedure Diagnosis Same as Pre-procedure Electronic Signature(s) Signed: 08/13/2021 10:59:15 AM By: Kalman Shan DO Signed: 08/13/2021 12:29:48 PM By: Donnamarie Poag Entered By: Donnamarie Poag on 08/13/2021 10:00:19 [name] ([id number]) -------------------------------------------------------------------------------- Debridement Details Patient Name: [name], [name]. Date of Service: 08/13/2021 9:00 AM Medical Record Number: [id number] Patient Account Number: [id number] Date of Birth/Sex: 1961/03/27 (60 y.o. M) Treating RN: Donnamarie Poag Primary Care Provider: Karl Ito Other Clinician: Referring Provider: Karl Ito Treating Provider/Extender: Yaakov Guthrie in Treatment: 1 Debridement Performed for Wound #1 Right,Posterior Lower Leg Assessment: Performed By: Physician Kalman Shan, MD Debridement Type: Debridement Severity of Tissue Pre Debridement: Fat layer exposed Level of Consciousness (Pre- Awake and Alert procedure): Pre-procedure Verification/Time Out Yes - 09:56 Taken: Start Time: 09:57 Pain Control: Lidocaine Total Area Debrided (L x W): 0.5 (cm) x 0.5 (cm) = 0.25 (cm) Tissue and other material Viable, Non-Viable, Eschar, Skin: Dermis debrided: Level: Skin/Dermis Debridement Description: Selective/Open Wound Instrument: Blade, Forceps, Scissors Bleeding: Minimum Hemostasis Achieved: Pressure Response to Treatment: Procedure was tolerated well Level of Consciousness (Post- Awake and Alert procedure): Post Debridement Measurements of Total Wound Length: (cm) 3.2 Width: (cm) 3.4 Depth: (cm) 0.1 Volume: (cm) 0.855 Character of Wound/Ulcer Post Debridement: Improved Severity of Tissue Post  Debridement: Fat layer exposed Post Procedure Diagnosis Same as Pre-procedure Electronic Signature(s) Signed: 08/13/2021 10:59:15 AM By: Kalman Shan DO Signed: 08/13/2021 12:29:48 PM By: Donnamarie Poag Entered By: Donnamarie Poag on 08/13/2021 10:00:40 [name] ([id number]) -------------------------------------------------------------------------------- Debridement Details Patient Name: [name], [name]. Date of Service: 08/13/2021 9:00 AM Medical Record Number: [id number] Patient Account Number: [id number] Date of Birth/Sex: 08/09/1961 (60 y.o. M) Treating RN: Donnamarie Poag Primary Care Provider: Karl Ito Other Clinician: Referring Provider: Karl Ito Treating Provider/Extender: Yaakov Guthrie in Treatment: 1 Debridement Performed for Wound #2 Right,Medial Lower Leg Assessment: Performed By: Physician Kalman Shan, MD Debridement  Type: Debridement Severity of Tissue Pre Debridement: Fat layer exposed Level of Consciousness (Pre- Awake and Alert procedure): Pre-procedure Verification/Time Out Yes - 09:56 Taken: Start Time: 09:58 Pain Control: Lidocaine Total Area Debrided (L x W): 0.5 (cm) x 0.5 (cm) = 0.25 (cm) Tissue and other material Viable, Non-Viable, Eschar, Skin: Dermis debrided: Level: Skin/Dermis Debridement Description: Selective/Open Wound Instrument: Blade, Forceps, Scissors Bleeding: Minimum Hemostasis Achieved: Pressure Response to Treatment: Procedure was tolerated well Level of Consciousness (Post- Awake and Alert procedure): Post Debridement Measurements of Total Wound Length: (cm) 2 Width: (cm) 1.5 Depth: (cm) 0.1 Volume: (cm) 0.236 Character of Wound/Ulcer Post Debridement: Improved Severity of Tissue Post Debridement: Fat layer exposed Post Procedure Diagnosis Same as Pre-procedure Electronic Signature(s) Signed: 08/13/2021 10:59:15 AM By: Kalman Shan DO Signed: 08/13/2021 12:29:48 PM By: Donnamarie Poag Entered By:  Donnamarie Poag on 08/13/2021 10:00:53 [name] ([id number]) -------------------------------------------------------------------------------- Debridement Details Patient Name: [name], [name]. Date of Service: 08/13/2021 9:00 AM Medical Record Number: [id number] Patient Account Number: [id number] Date of Birth/Sex: [birth year] (60 y.o. M) Treating RN: Donnamarie Poag Primary Care Provider: Karl Ito Other Clinician: Referring Provider: Karl Ito Treating Provider/Extender: Yaakov Guthrie in Treatment: 1 Debridement Performed for Wound #3 Right,Distal Lower Leg Assessment: Performed By: Physician Kalman Shan, MD Debridement Type: Debridement Severity of Tissue Pre Debridement: Fat layer exposed Level of Consciousness (Pre- Awake and Alert procedure): Pre-procedure Verification/Time Out Yes - 09:56 Taken: Start Time: 09:59 Pain Control: Lidocaine Total Area Debrided (L x W): 0.5 (cm) x 0.5 (cm) = 0.25 (cm) Tissue and other material Viable, Non-Viable, Eschar, Skin: Dermis debrided: Level: Skin/Dermis Debridement Description: Selective/Open Wound Instrument: Blade, Forceps, Scissors Bleeding: Minimum Hemostasis Achieved: Pressure Response to Treatment: Procedure was tolerated well Level of Consciousness (Post- Awake and Alert procedure): Post Debridement Measurements of Total Wound Length: (cm) 0.7 Width: (cm) 1 Depth: (cm) 0.1 Volume: (cm) 0.055 Character of Wound/Ulcer Post Debridement: Improved Severity of Tissue Post Debridement: Fat layer exposed Post Procedure Diagnosis Same as Pre-procedure Electronic Signature(s) Signed: 08/13/2021 10:59:15 AM By: Kalman Shan DO Signed: 08/13/2021 12:29:48 PM By: Donnamarie Poag Entered By: Donnamarie Poag on 08/13/2021 10:01:41 [name] ([id number]) -------------------------------------------------------------------------------- Debridement Details Patient Name: [name], [name]. Date of Service:  08/13/2021 9:00 AM Medical Record Number: [id number] Patient Account Number: [id number] Date of Birth/Sex: 01/10/1961 (60 y.o. M) Treating RN: Donnamarie Poag Primary Care Provider: Karl Ito Other Clinician: Referring Provider: Karl Ito Treating Provider/Extender: Yaakov Guthrie in Treatment: 1 Debridement Performed for Wound #4 Right,Lateral Lower Leg Assessment: Performed By: Physician Kalman Shan, MD Debridement Type: Debridement Severity of Tissue Pre Debridement: Fat layer exposed Level of Consciousness (Pre- Awake and Alert procedure): Pre-procedure Verification/Time Out Yes - 09:56 Taken: Start Time: 10:01 Pain Control: Lidocaine Total Area Debrided (L x W): 0.5 (cm) x 0.5 (cm) = 0.25 (cm) Tissue and other material Viable, Non-Viable, Eschar, Skin: Dermis debrided: Level: Skin/Dermis Debridement Description: Selective/Open Wound Instrument: Blade, Forceps, Scissors Bleeding: Minimum Hemostasis Achieved: Pressure Response to Treatment: Procedure was tolerated well Level of Consciousness (Post- Awake and Alert procedure): Post Debridement Measurements of Total Wound Length: (cm) 1.3 Width: (cm) 0.7 Depth: (cm) 0.1 Volume: (cm) 0.071 Character of Wound/Ulcer Post Debridement: Improved Severity of Tissue Post Debridement: Fat layer exposed Post Procedure Diagnosis Same as Pre-procedure Electronic Signature(s) Signed: 08/13/2021 10:59:15 AM By: Kalman Shan DO Signed: 08/13/2021 12:29:48 PM By: Donnamarie Poag Entered By: Donnamarie Poag on 08/13/2021 10:02:24 [name] ([id number]) -------------------------------------------------------------------------------- HPI Details Patient Name: [name], [name]. Date  of Service: 08/13/2021 9:00 AM Medical Record Number: [id number] Patient Account Number: [id number] Date of Birth/Sex: 06/03/1961 (60 y.o. M) Treating RN: Donnamarie Poag Primary Care Provider: Karl Ito Other Clinician: Referring  Provider: Karl Ito Treating Provider/Extender: Yaakov Guthrie in Treatment: 1 History of Present Illness HPI Description: Admission 08/06/2021 Mr. Robley Matassa is a 60 year old male with a past medical history of uncontrolled insulin-dependent type 2 diabetes, current cigarette smoker, CABG, chronic systolic congestive heart failure and history of osteomyelitis of the left foot status post fifth digit amputation that presents to the clinic for a 2-week history of nonhealing wounds to his lower extremities bilaterally. He states that 1 evening he developed blisters on his legs that eventually popped and developed tightly adhered nonviable tissue. He was evaluated by his primary care office and they noted increased erythema to the periwound's and he was started on doxycycline. He is currently taking this and reports improvement in his symptoms. He denies pain. He denies drainage. 10/26; patient presents for follow-up. He was able to start [facility] and has been using this for the past week. He no longer experiences pain or increased warmth or redness to the periwound's. He has no issues or complaints today. Electronic Signature(s) Signed: 08/13/2021 10:59:15 AM By: Kalman Shan DO Entered By: Kalman Shan on 08/13/2021 10:54:39 [name] ([id number]) -------------------------------------------------------------------------------- Physical Exam Details Patient Name: [name], [name]. Date of Service: 08/13/2021 9:00 AM Medical Record Number: [id number] Patient Account Number: [id number] Date of Birth/Sex: [birth year] (60 y.o. M) Treating RN: Donnamarie Poag Primary Care Provider: Karl Ito Other Clinician: Referring Provider: Karl Ito Treating Provider/Extender: Yaakov Guthrie in Treatment: 1 Constitutional . Cardiovascular . Psychiatric . Notes Bilateral lower extremity wounds with tightly adhered nonviable tissue. Electronic Signature(s) Signed:  08/13/2021 10:59:15 AM By: Kalman Shan DO Entered By: Kalman Shan on 08/13/2021 10:55:16 [name] ([id number]) -------------------------------------------------------------------------------- Physician Orders Details Patient Name: [name], [name]. Date of Service: 08/13/2021 9:00 AM Medical Record Number: [id number] Patient Account Number: [id number] Date of Birth/Sex: 08-17-1961 (60 y.o. M) Treating RN: Donnamarie Poag Primary Care Provider: Karl Ito Other Clinician: Referring Provider: Karl Ito Treating Provider/Extender: Yaakov Guthrie in Treatment: 1 Verbal / Phone Orders: No Diagnosis Coding Follow-up Appointments o Return Appointment in 1 week. Bathing/ Shower/ Hygiene o May shower; gently cleanse wound with antibacterial soap, rinse and pat dry prior to dressing wounds Edema Control - Lymphedema / Segmental Compressive Device / Other o Elevate, Exercise Daily and Avoid Standing for Long Periods of Time. o Elevate legs to the level of the heart and pump ankles as often as possible o Elevate leg(s) parallel to the floor when sitting. Wound Treatment Wound #1 - Lower Leg Wound Laterality: Right, Posterior Cleanser: Byram Ancillary Kit - 15 Day Supply (Generic) 1 x Per Day/30 Days Discharge Instructions: Use supplies as instructed; Kit contains: (15) Saline Bullets; (15) 3x3 Gauze; 15 pr Gloves Cleanser: Soap and Water 1 x Per Day/30 Days Discharge Instructions: Gently cleanse wound with antibacterial soap, rinse and pat dry prior to dressing wounds Primary Dressing: Gauze (Generic) 1 x Per Day/30 Days Discharge Instructions: As directed: dry, moistened with saline or moistened with Dakins Solution Primary Dressing: Santyl Collagenase Ointment, 30 (gm), tube 1 x Per Day/30 Days Secondary Dressing: Kerlix 4.5 x 4.1 (in/yd) (Generic) 1 x Per Day/30 Days Discharge Instructions: Apply Kerlix 4.5 x 4.1 (in/yd) as instructed Secured With: 97M  Medipore H Soft Cloth Surgical Tape, 2x2 (in/yd) (Generic) 1 x Per Day/30 Days Wound #2 -  Lower Leg Wound Laterality: Right, Medial Cleanser: Byram Ancillary Kit - 15 Day Supply (Generic) 1 x Per Day/30 Days Discharge Instructions: Use supplies as instructed; Kit contains: (15) Saline Bullets; (15) 3x3 Gauze; 15 pr Gloves Cleanser: Soap and Water 1 x Per Day/30 Days Discharge Instructions: Gently cleanse wound with antibacterial soap, rinse and pat dry prior to dressing wounds Primary Dressing: Gauze (Generic) 1 x Per Day/30 Days Discharge Instructions: As directed: dry, moistened with saline or moistened with Dakins Solution Primary Dressing: Santyl Collagenase Ointment, 30 (gm), tube 1 x Per Day/30 Days Secondary Dressing: Kerlix 4.5 x 4.1 (in/yd) (Generic) 1 x Per Day/30 Days Discharge Instructions: Apply Kerlix 4.5 x 4.1 (in/yd) as instructed Secured With: 21M Medipore H Soft Cloth Surgical Tape, 2x2 (in/yd) (Generic) 1 x Per Day/30 Days Wound #3 - Lower Leg Wound Laterality: Right, Distal Cleanser: Byram Ancillary Kit - 15 Day Supply (Generic) 1 x Per Day/30 Days Discharge Instructions: Use supplies as instructed; Kit contains: (15) Saline Bullets; (15) 3x3 Gauze; 15 pr Gloves Cleanser: Soap and Water 1 x Per Day/30 Days Discharge Instructions: Gently cleanse wound with antibacterial soap, rinse and pat dry prior to dressing wounds Primary Dressing: Gauze (Generic) 1 x Per Day/30 Days LINDLEY, HINEY ([id number]) Discharge Instructions: As directed: dry, moistened with saline or moistened with Dakins Solution Primary Dressing: Santyl Collagenase Ointment, 30 (gm), tube 1 x Per Day/30 Days Secondary Dressing: Kerlix 4.5 x 4.1 (in/yd) (Generic) 1 x Per Day/30 Days Discharge Instructions: Apply Kerlix 4.5 x 4.1 (in/yd) as instructed Secured With: 21M Medipore H Soft Cloth Surgical Tape, 2x2 (in/yd) (Generic) 1 x Per Day/30 Days Wound #4 - Lower Leg Wound Laterality: Right, Lateral Cleanser:  Byram Ancillary Kit - 15 Day Supply (Generic) 1 x Per Day/30 Days Discharge Instructions: Use supplies as instructed; Kit contains: (15) Saline Bullets; (15) 3x3 Gauze; 15 pr Gloves Cleanser: Soap and Water 1 x Per Day/30 Days Discharge Instructions: Gently cleanse wound with antibacterial soap, rinse and pat dry prior to dressing wounds Primary Dressing: Gauze (Generic) 1 x Per Day/30 Days Discharge Instructions: As directed: dry, moistened with saline or moistened with Dakins Solution Primary Dressing: Santyl Collagenase Ointment, 30 (gm), tube 1 x Per Day/30 Days Secondary Dressing: Kerlix 4.5 x 4.1 (in/yd) (Generic) 1 x Per Day/30 Days Discharge Instructions: Apply Kerlix 4.5 x 4.1 (in/yd) as instructed Secured With: 21M Medipore H Soft Cloth Surgical Tape, 2x2 (in/yd) (Generic) 1 x Per Day/30 Days Wound #5 - Lower Leg Wound Laterality: Left, Medial Cleanser: Byram Ancillary Kit - 15 Day Supply (Generic) 1 x Per Day/30 Days Discharge Instructions: Use supplies as instructed; Kit contains: (15) Saline Bullets; (15) 3x3 Gauze; 15 pr Gloves Cleanser: Soap and Water 1 x Per Day/30 Days Discharge Instructions: Gently cleanse wound with antibacterial soap, rinse and pat dry prior to dressing wounds Primary Dressing: Gauze (Generic) 1 x Per Day/30 Days Discharge Instructions: As directed: dry, moistened with saline or moistened with Dakins Solution Primary Dressing: Santyl Collagenase Ointment, 30 (gm), tube 1 x Per Day/30 Days Secondary Dressing: Kerlix 4.5 x 4.1 (in/yd) (Generic) 1 x Per Day/30 Days Discharge Instructions: Apply Kerlix 4.5 x 4.1 (in/yd) as instructed Secured With: 21M Medipore H Soft Cloth Surgical Tape, 2x2 (in/yd) (Generic) 1 x Per Day/30 Days Electronic Signature(s) Signed: 08/13/2021 10:59:15 AM By: Kalman Shan DO Signed: 08/13/2021 12:29:48 PM By: Donnamarie Poag Entered By: Donnamarie Poag on 08/13/2021 10:03:13 [name]  ([id number]) -------------------------------------------------------------------------------- Problem List Details Patient Name: [name], [name].  Date of Service: 08/13/2021 9:00 AM Medical Record Number: [id number] Patient Account Number: [id number] Date of Birth/Sex: [birth year] (60 y.o. M) Treating RN: Donnamarie Poag Primary Care Provider: Karl Ito Other Clinician: Referring Provider: Karl Ito Treating Provider/Extender: Yaakov Guthrie in Treatment: 1 Active Problems ICD-10 Encounter Code Description Active Date MDM Diagnosis L97.919 Non-pressure chronic ulcer of unspecified part of right lower leg with 08/06/2021 No Yes unspecified severity L97.829 Non-pressure chronic ulcer of other part of left lower leg with 08/06/2021 No Yes unspecified severity E11.9 Type 2 diabetes mellitus without complications 86/57/8469 No Yes I10 Essential (primary) hypertension 08/06/2021 No Yes G29.52 Chronic systolic (congestive) heart failure 08/06/2021 No Yes I25.119 Atherosclerotic heart disease of native coronary artery with unspecified 08/06/2021 No Yes angina pectoris Z95.1 Presence of aortocoronary bypass graft 08/06/2021 No Yes Inactive Problems Resolved Problems Electronic Signature(s) Signed: 08/13/2021 10:59:15 AM By: Kalman Shan DO Entered By: Kalman Shan on 08/13/2021 10:53:44 [name] ([id number]) -------------------------------------------------------------------------------- Progress Note Details Patient Name: [name]. Date of Service: 08/13/2021 9:00 AM Medical Record Number: [id number] Patient Account Number: [id number] Date of Birth/Sex: 11/16/1960 (60 y.o. M) Treating RN: Donnamarie Poag Primary Care Provider: Karl Ito Other Clinician: Referring Provider: Karl Ito Treating Provider/Extender: Yaakov Guthrie in Treatment: 1 Subjective Chief Complaint Information obtained from Patient Bilateral lower extremity wounds History  of Present Illness (HPI) Admission 08/06/2021 Mr. Brandn Mcgath is a 60 year old male with a past medical history of uncontrolled insulin-dependent type 2 diabetes, current cigarette smoker, CABG, chronic systolic congestive heart failure and history of osteomyelitis of the left foot status post fifth digit amputation that presents to the clinic for a 2-week history of nonhealing wounds to his lower extremities bilaterally. He states that 1 evening he developed blisters on his legs that eventually popped and developed tightly adhered nonviable tissue. He was evaluated by his primary care office and they noted increased erythema to the periwound's and he was started on doxycycline. He is currently taking this and reports improvement in his symptoms. He denies pain. He denies drainage. 10/26; patient presents for follow-up. He was able to start [facility] and has been using this for the past week. He no longer experiences pain or increased warmth or redness to the periwound's. He has no issues or complaints today. Patient History Information obtained from Patient. Social History Former smoker, Marital Status - Separated, Alcohol Use - Never, Drug Use - No History, Caffeine Use - Daily. Medical History Cardiovascular Patient has history of Coronary Artery Disease, Hypertension, Myocardial Infarction Endocrine Patient has history of Type II Diabetes Objective Constitutional Vitals Time Taken: 9:13 AM, Height: 65 in, Weight: 180 lbs, BMI: 30, Temperature: 97.9 F, Pulse: 76 bpm, Respiratory Rate: 16 breaths/min, Blood Pressure: 118/75 mmHg. General Notes: Bilateral lower extremity wounds with tightly adhered nonviable tissue. Integumentary (Hair, Skin) Wound #1 status is Open. Original cause of wound was Gradually Appeared. The date acquired was: 07/19/2021. The wound has been in treatment 1 weeks. The wound is located on the Right,Posterior Lower Leg. The wound measures 3.2cm length x 3.4cm width x  0.1cm depth; 8.545cm^2 area and 0.855cm^3 volume. There is Fat Layer (Subcutaneous Tissue) exposed. There is no tunneling or undermining noted. There is a none present amount of drainage noted. There is no granulation within the wound bed. There is a large (67-100%) amount of necrotic tissue within the wound bed including Eschar. Wound #2 status is Open. Original cause of wound was Gradually Appeared. The date acquired was: 07/19/2021. The wound has been  in treatment 1 weeks. The wound is located on the Right,Medial Lower Leg. The wound measures 2cm length x 1.5cm width x 0.1cm depth; 2.356cm^2 area and 0.236cm^3 volume. There is no tunneling or undermining noted. There is a none present amount of drainage noted. There is no granulation within the wound bed. There is a large (67-100%) amount of necrotic tissue within the wound bed including Eschar. Wound #3 status is Open. Original cause of wound was Gradually Appeared. The date acquired was: 07/19/2021. The wound has been in treatment 1 weeks. The wound is located on the Right,Distal Lower Leg. The wound measures 0.7cm length x 1cm width x 0.1cm depth; 0.55cm^2 area and [name], [name]. ([id number]) 0.055cm^3 volume. There is Fat Layer (Subcutaneous Tissue) exposed. There is no tunneling or undermining noted. There is a none present amount of drainage noted. There is no granulation within the wound bed. There is a large (67-100%) amount of necrotic tissue within the wound bed including Eschar. Wound #4 status is Open. Original cause of wound was Gradually Appeared. The date acquired was: 07/19/2021. The wound has been in treatment 1 weeks. The wound is located on the Right,Lateral Lower Leg. The wound measures 1.3cm length x 0.7cm width x 0.1cm depth; 0.715cm^2 area and 0.071cm^3 volume. There is no tunneling or undermining noted. There is a none present amount of drainage noted. There is no granulation within the wound bed. There is a large (67-100%)  amount of necrotic tissue within the wound bed including Eschar. Wound #5 status is Open. Original cause of wound was Gradually Appeared. The date acquired was: 07/19/2021. The wound has been in treatment 1 weeks. The wound is located on the Left,Medial Lower Leg. The wound measures 1cm length x 1.2cm width x 0.1cm depth; 0.942cm^2 area and 0.094cm^3 volume. There is no tunneling or undermining noted. There is a none present amount of drainage noted. There is no granulation within the wound bed. There is a large (67-100%) amount of necrotic tissue within the wound bed including Eschar. Assessment Active Problems ICD-10 Non-pressure chronic ulcer of unspecified part of right lower leg with unspecified severity Non-pressure chronic ulcer of other part of left lower leg with unspecified severity Type 2 diabetes mellitus without complications Essential (primary) hypertension Chronic systolic (congestive) heart failure Atherosclerotic heart disease of native coronary artery with unspecified angina pectoris Presence of aortocoronary bypass graft Patient's wounds have shown improvement since last clinic visit. There is no longer increased erythema to the periwounds. I do not believe he needs continued antibiotics at this time. I debrided a small amount of nonviable tissue. It is tightly adhered and I crosshatched the remaining. I recommended continuing Santyl. No swelling noted on exam. We rediscussed the importance of glycemic control and wound healing and the potential for poor outcome if he is not able to do this. ABIs with TBI's have not been obtained. Intake nurse has really sent the order Procedures Wound #1 Pre-procedure diagnosis of Wound #1 is a Diabetic Wound/Ulcer of the Lower Extremity located on the Right,Posterior Lower Leg .Severity of Tissue Pre Debridement is: Fat layer exposed. There was a Selective/Open Wound Skin/Dermis Debridement with a total area of 0.25 sq cm performed by  Kalman Shan, MD. With the following instrument(s): Blade, Forceps, and Scissors to remove Viable and Non-Viable tissue/material. Material removed includes Eschar and Skin: Dermis and after achieving pain control using Lidocaine. A time out was conducted at 09:56, prior to the start of the procedure. A Minimum amount of bleeding was  controlled with Pressure. The procedure was tolerated well. Post Debridement Measurements: 3.2cm length x 3.4cm width x 0.1cm depth; 0.855cm^3 volume. Character of Wound/Ulcer Post Debridement is improved. Severity of Tissue Post Debridement is: Fat layer exposed. Post procedure Diagnosis Wound #1: Same as Pre-Procedure Wound #2 Pre-procedure diagnosis of Wound #2 is a Diabetic Wound/Ulcer of the Lower Extremity located on the Right,Medial Lower Leg .Severity of Tissue Pre Debridement is: Fat layer exposed. There was a Selective/Open Wound Skin/Dermis Debridement with a total area of 0.25 sq cm performed by Kalman Shan, MD. With the following instrument(s): Blade, Forceps, and Scissors to remove Viable and Non-Viable tissue/material. Material removed includes Eschar and Skin: Dermis and after achieving pain control using Lidocaine. A time out was conducted at 09:56, prior to the start of the procedure. A Minimum amount of bleeding was controlled with Pressure. The procedure was tolerated well. Post Debridement Measurements: 2cm length x 1.5cm width x 0.1cm depth; 0.236cm^3 volume. Character of Wound/Ulcer Post Debridement is improved. Severity of Tissue Post Debridement is: Fat layer exposed. Post procedure Diagnosis Wound #2: Same as Pre-Procedure Wound #3 Pre-procedure diagnosis of Wound #3 is a Diabetic Wound/Ulcer of the Lower Extremity located on the Right,Distal Lower Leg .Severity of Tissue Pre Debridement is: Fat layer exposed. There was a Selective/Open Wound Skin/Dermis Debridement with a total area of 0.25 sq cm performed by Kalman Shan, MD.  With the following instrument(s): Blade, Forceps, and Scissors to remove Viable and Non-Viable tissue/material. Material removed includes Eschar and Skin: Dermis and after achieving pain control using Lidocaine. A time out was conducted at 09:56, prior to the start of the procedure. A Minimum amount of bleeding was controlled with Pressure. The procedure was tolerated well. Post Debridement Measurements: 0.7cm length x 1cm width x 0.1cm depth; 0.055cm^3 volume. Character of Wound/Ulcer Post Debridement is improved. Severity of Tissue Post Debridement is: Fat layer exposed. Post procedure Diagnosis Wound #3: Same as Pre-Procedure Wound #4 [name], [name] ([id number]) Pre-procedure diagnosis of Wound #4 is a Diabetic Wound/Ulcer of the Lower Extremity located on the Right,Lateral Lower Leg .Severity of Tissue Pre Debridement is: Fat layer exposed. There was a Selective/Open Wound Skin/Dermis Debridement with a total area of 0.25 sq cm performed by Kalman Shan, MD. With the following instrument(s): Blade, Forceps, and Scissors to remove Viable and Non-Viable tissue/material. Material removed includes Eschar and Skin: Dermis and after achieving pain control using Lidocaine. A time out was conducted at 09:56, prior to the start of the procedure. A Minimum amount of bleeding was controlled with Pressure. The procedure was tolerated well. Post Debridement Measurements: 1.3cm length x 0.7cm width x 0.1cm depth; 0.071cm^3 volume. Character of Wound/Ulcer Post Debridement is improved. Severity of Tissue Post Debridement is: Fat layer exposed. Post procedure Diagnosis Wound #4: Same as Pre-Procedure Wound #5 Pre-procedure diagnosis of Wound #5 is a Diabetic Wound/Ulcer of the Lower Extremity located on the Left,Medial Lower Leg .Severity of Tissue Pre Debridement is: Fat layer exposed. There was a Selective/Open Wound Skin/Dermis Debridement with a total area of 0.25 sq cm performed by Kalman Shan,  MD. With the following instrument(s): Blade, Forceps, and Scissors to remove Viable and Non-Viable tissue/material. Material removed includes Eschar and Skin: Dermis and after achieving pain control using Lidocaine. A time out was conducted at 09:56, prior to the start of the procedure. A Minimum amount of bleeding was controlled with Pressure. The procedure was tolerated well. Post Debridement Measurements: 1cm length x 1.2cm width x 0.1cm depth; 0.094cm^3 volume. Character  of Wound/Ulcer Post Debridement is improved. Severity of Tissue Post Debridement is: Fat layer exposed. Post procedure Diagnosis Wound #5: Same as Pre-Procedure Plan Follow-up Appointments: Return Appointment in 1 week. Bathing/ Shower/ Hygiene: May shower; gently cleanse wound with antibacterial soap, rinse and pat dry prior to dressing wounds Edema Control - Lymphedema / Segmental Compressive Device / Other: Elevate, Exercise Daily and Avoid Standing for Long Periods of Time. Elevate legs to the level of the heart and pump ankles as often as possible Elevate leg(s) parallel to the floor when sitting. WOUND #1: - Lower Leg Wound Laterality: Right, Posterior Cleanser: Byram Ancillary Kit - 15 Day Supply (Generic) 1 x Per Day/30 Days Discharge Instructions: Use supplies as instructed; Kit contains: (15) Saline Bullets; (15) 3x3 Gauze; 15 pr Gloves Cleanser: Soap and Water 1 x Per Day/30 Days Discharge Instructions: Gently cleanse wound with antibacterial soap, rinse and pat dry prior to dressing wounds Primary Dressing: Gauze (Generic) 1 x Per Day/30 Days Discharge Instructions: As directed: dry, moistened with saline or moistened with Dakins Solution Primary Dressing: Santyl Collagenase Ointment, 30 (gm), tube 1 x Per Day/30 Days Secondary Dressing: Kerlix 4.5 x 4.1 (in/yd) (Generic) 1 x Per Day/30 Days Discharge Instructions: Apply Kerlix 4.5 x 4.1 (in/yd) as instructed Secured With: 9M Medipore H Soft Cloth Surgical  Tape, 2x2 (in/yd) (Generic) 1 x Per Day/30 Days WOUND #2: - Lower Leg Wound Laterality: Right, Medial Cleanser: Byram Ancillary Kit - 15 Day Supply (Generic) 1 x Per Day/30 Days Discharge Instructions: Use supplies as instructed; Kit contains: (15) Saline Bullets; (15) 3x3 Gauze; 15 pr Gloves Cleanser: Soap and Water 1 x Per Day/30 Days Discharge Instructions: Gently cleanse wound with antibacterial soap, rinse and pat dry prior to dressing wounds Primary Dressing: Gauze (Generic) 1 x Per Day/30 Days Discharge Instructions: As directed: dry, moistened with saline or moistened with Dakins Solution Primary Dressing: Santyl Collagenase Ointment, 30 (gm), tube 1 x Per Day/30 Days Secondary Dressing: Kerlix 4.5 x 4.1 (in/yd) (Generic) 1 x Per Day/30 Days Discharge Instructions: Apply Kerlix 4.5 x 4.1 (in/yd) as instructed Secured With: 9M Medipore H Soft Cloth Surgical Tape, 2x2 (in/yd) (Generic) 1 x Per Day/30 Days WOUND #3: - Lower Leg Wound Laterality: Right, Distal Cleanser: Byram Ancillary Kit - 15 Day Supply (Generic) 1 x Per Day/30 Days Discharge Instructions: Use supplies as instructed; Kit contains: (15) Saline Bullets; (15) 3x3 Gauze; 15 pr Gloves Cleanser: Soap and Water 1 x Per Day/30 Days Discharge Instructions: Gently cleanse wound with antibacterial soap, rinse and pat dry prior to dressing wounds Primary Dressing: Gauze (Generic) 1 x Per Day/30 Days Discharge Instructions: As directed: dry, moistened with saline or moistened with Dakins Solution Primary Dressing: Santyl Collagenase Ointment, 30 (gm), tube 1 x Per Day/30 Days Secondary Dressing: Kerlix 4.5 x 4.1 (in/yd) (Generic) 1 x Per Day/30 Days Discharge Instructions: Apply Kerlix 4.5 x 4.1 (in/yd) as instructed Secured With: 9M Medipore H Soft Cloth Surgical Tape, 2x2 (in/yd) (Generic) 1 x Per Day/30 Days WOUND #4: - Lower Leg Wound Laterality: Right, Lateral Cleanser: Byram Ancillary Kit - 15 Day Supply (Generic) 1 x Per  Day/30 Days Discharge Instructions: Use supplies as instructed; Kit contains: (15) Saline Bullets; (15) 3x3 Gauze; 15 pr Gloves Cleanser: Soap and Water 1 x Per Day/30 Days Discharge Instructions: Gently cleanse wound with antibacterial soap, rinse and pat dry prior to dressing wounds Primary Dressing: Gauze (Generic) 1 x Per Day/30 Days Discharge Instructions: As directed: dry, moistened with saline or moistened  with Dakins Solution Primary Dressing: Santyl Collagenase Ointment, 30 (gm), tube 1 x Per Day/30 Days Secondary Dressing: Kerlix 4.5 x 4.1 (in/yd) (Generic) 1 x Per Day/30 Days Discharge Instructions: Apply Kerlix 4.5 x 4.1 (in/yd) as instructed AESON, SAWYERS ([id number]) Secured With: 59M Medipore H Soft Cloth Surgical Tape, 2x2 (in/yd) (Generic) 1 x Per Day/30 Days WOUND #5: - Lower Leg Wound Laterality: Left, Medial Cleanser: Byram Ancillary Kit - 15 Day Supply (Generic) 1 x Per Day/30 Days Discharge Instructions: Use supplies as instructed; Kit contains: (15) Saline Bullets; (15) 3x3 Gauze; 15 pr Gloves Cleanser: Soap and Water 1 x Per Day/30 Days Discharge Instructions: Gently cleanse wound with antibacterial soap, rinse and pat dry prior to dressing wounds Primary Dressing: Gauze (Generic) 1 x Per Day/30 Days Discharge Instructions: As directed: dry, moistened with saline or moistened with Dakins Solution Primary Dressing: Santyl Collagenase Ointment, 30 (gm), tube 1 x Per Day/30 Days Secondary Dressing: Kerlix 4.5 x 4.1 (in/yd) (Generic) 1 x Per Day/30 Days Discharge Instructions: Apply Kerlix 4.5 x 4.1 (in/yd) as instructed Secured With: 59M Medipore H Soft Cloth Surgical Tape, 2x2 (in/yd) (Generic) 1 x Per Day/30 Days 1. In office sharp debridement 2. Santyl daily 3. Follow-up in 1 week Electronic Signature(s) Signed: 08/13/2021 10:59:15 AM By: Kalman Shan DO Entered By: Kalman Shan on 08/13/2021 10:58:42 [name]  ([id number]) -------------------------------------------------------------------------------- ROS/PFSH Details Patient Name: JSHON, IBE. Date of Service: 08/13/2021 9:00 AM Medical Record Number: [id number] Patient Account Number: [id number] Date of Birth/Sex: 1961/05/19 (60 y.o. M) Treating RN: Donnamarie Poag Primary Care Provider: Karl Ito Other Clinician: Referring Provider: Karl Ito Treating Provider/Extender: Yaakov Guthrie in Treatment: 1 Information Obtained From Patient Cardiovascular Medical History: Positive for: Coronary Artery Disease; Hypertension; Myocardial Infarction Endocrine Medical History: Positive for: Type II Diabetes Time with diabetes: 7 years Treated with: Oral agents Blood sugar tested every day: Yes Tested : Immunizations Pneumococcal Vaccine: Received Pneumococcal Vaccination: No Implantable Devices None Family and Social History Former smoker; Marital Status - Separated; Alcohol Use: Never; Drug Use: No History; Caffeine Use: Daily; Financial Concerns: No; Food, Clothing or Shelter Needs: No; Support System Lacking: No; Transportation Concerns: No Electronic Signature(s) Signed: 08/13/2021 10:59:15 AM By: Kalman Shan DO Signed: 08/13/2021 12:29:48 PM By: Donnamarie Poag Entered By: Kalman Shan on 08/13/2021 10:54:46 JAHARI, BILLY ([id number]) -------------------------------------------------------------------------------- Jay Details Patient Name: CLOUD, GRAHAM. Date of Service: 08/13/2021 Medical Record Number: [id number] Patient Account Number: [id number] Date of Birth/Sex: 11-18-1960 (60 y.o. M) Treating RN: Donnamarie Poag Primary Care Provider: Karl Ito Other Clinician: Referring Provider: Karl Ito Treating Provider/Extender: Yaakov Guthrie in Treatment: 1 Diagnosis Coding ICD-10 Codes Code Description M19.622 Non-pressure chronic ulcer of unspecified part of right lower leg with unspecified  severity L97.829 Non-pressure chronic ulcer of other part of left lower leg with unspecified severity E11.9 Type 2 diabetes mellitus without complications W97 Essential (primary) hypertension L89.21 Chronic systolic (congestive) heart failure I25.119 Atherosclerotic heart disease of native coronary artery with unspecified angina pectoris Z95.1 Presence of aortocoronary bypass graft Facility Procedures CPT4 Code: [id number] Description: 14481 - DEBRIDE WOUND 1ST 20 SQ CM OR < Modifier: Quantity: 1 CPT4 Code: Description: ICD-10 Diagnosis Description L97.919 Non-pressure chronic ulcer of unspecified part of right lower leg with unspec L97.829 Non-pressure chronic ulcer of other part of left lower leg with unspecified s Modifier: ified severity everity Quantity: Physician Procedures CPT4 Code: 8563149 Description: 97597 - WC PHYS DEBR WO ANESTH 20 SQ CM Modifier: Quantity: 1 CPT4 Code: Description:  ICD-10 Diagnosis Description W29.937 Non-pressure chronic ulcer of unspecified part of right lower leg with unspec L97.829 Non-pressure chronic ulcer of other part of left lower leg with unspecified s Modifier: ified severity everity Quantity: Electronic Signature(s) Signed: 08/13/2021 10:59:15 AM By: Kalman Shan DO Entered By: Kalman Shan on 08/13/2021 10:58:51

## 2021-08-13 NOTE — Progress Notes (Signed)
MYLEN, MANGAN ([id number]) Visit Report for 08/13/2021 Arrival Information Details Patient Name: [name], [name]. Date of Service: 08/13/2021 9:00 AM Medical Record Number: [id number] Patient Account Number: [id number] Date of Birth/Sex: 1961/05/30 (60 y.o. M) Treating RN: Donnamarie Poag Primary Care Sanda Dejoy: Karl Ito Other Clinician: Referring Amandine Covino: Karl Ito Treating Michelyn Scullin/Extender: Yaakov Guthrie in Treatment: 1 Visit Information History Since Last Visit Added or deleted any medications: No Patient Arrived: Ambulatory Had a fall or experienced change in No Arrival Time: 09:07 activities of daily living that may affect Accompanied By: daughter risk of falls: Transfer Assistance: None Hospitalized since last visit: No Patient Identification Verified: Yes Has Dressing in Place as Prescribed: Yes Secondary Verification Process Completed: Yes Pain Present Now: No Patient Requires Transmission-Based Precautions: No Patient Has Alerts: No Electronic Signature(s) Signed: 08/13/2021 12:29:48 PM By: Donnamarie Poag Entered By: Donnamarie Poag on 08/13/2021 09:13:08 [name] ([id number]) -------------------------------------------------------------------------------- Clinic Level of Care Assessment Details Patient Name: [name], [name]. Date of Service: 08/13/2021 9:00 AM Medical Record Number: [id number] Patient Account Number: [id number] Date of Birth/Sex: Mar 21, 1961 (60 y.o. M) Treating RN: Donnamarie Poag Primary Care Rhydian Baldi: Karl Ito Other Clinician: Referring Jacoria Keiffer: Karl Ito Treating Duwayne Matters/Extender: Yaakov Guthrie in Treatment: 1 Clinic Level of Care Assessment Items TOOL 1 Quantity Score '[]'  - Use when EandM and Procedure is performed on INITIAL visit 0 ASSESSMENTS - Nursing Assessment / Reassessment '[]'  - General Physical Exam (combine w/ comprehensive assessment (listed just below) when performed on new 0 pt. evals) '[]'  -  0 Comprehensive Assessment (HX, ROS, Risk Assessments, Wounds Hx, etc.) ASSESSMENTS - Wound and Skin Assessment / Reassessment '[]'  - Dermatologic / Skin Assessment (not related to wound area) 0 ASSESSMENTS - Ostomy and/or Continence Assessment and Care '[]'  - Incontinence Assessment and Management 0 '[]'  - 0 Ostomy Care Assessment and Management (repouching, etc.) PROCESS - Coordination of Care '[]'  - Simple Patient / Family Education for ongoing care 0 '[]'  - 0 Complex (extensive) Patient / Family Education for ongoing care '[]'  - 0 Staff obtains Programmer, systems, Records, Test Results / Process Orders '[]'  - 0 Staff telephones HHA, Nursing Homes / Clarify orders / etc '[]'  - 0 Routine Transfer to another Facility (non-emergent condition) '[]'  - 0 Routine Hospital Admission (non-emergent condition) '[]'  - 0 New Admissions / Biomedical engineer / Ordering NPWT, Apligraf, etc. '[]'  - 0 Emergency Hospital Admission (emergent condition) PROCESS - Special Needs '[]'  - Pediatric / Minor Patient Management 0 '[]'  - 0 Isolation Patient Management '[]'  - 0 Hearing / Language / Visual special needs '[]'  - 0 Assessment of Community assistance (transportation, D/C planning, etc.) '[]'  - 0 Additional assistance / Altered mentation '[]'  - 0 Support Surface(s) Assessment (bed, cushion, seat, etc.) INTERVENTIONS - Miscellaneous '[]'  - External ear exam 0 '[]'  - 0 Patient Transfer (multiple staff / Civil Service fast streamer / Similar devices) '[]'  - 0 Simple Staple / Suture removal (25 or less) '[]'  - 0 Complex Staple / Suture removal (26 or more) '[]'  - 0 Hypo/Hyperglycemic Management (do not check if billed separately) '[]'  - 0 Ankle / Brachial Index (ABI) - do not check if billed separately Has the patient been seen at the hospital within the last three years: Yes Total Score: 0 Level Of Care: ____ [name] ([id number]) Electronic Signature(s) Signed: 08/13/2021 12:29:48 PM By: Donnamarie Poag Entered By: Donnamarie Poag on 08/13/2021  10:15:36 [name] ([id number]) -------------------------------------------------------------------------------- Encounter Discharge Information Details Patient Name: [name], SUPPLE. Date of Service: 08/13/2021 9:00 AM Medical  Record Number: [id number] Patient Account Number: [id number] Date of Birth/Sex: 1960-12-08 (60 y.o. M) Treating RN: Donnamarie Poag Primary Care Joanette Silveria: Karl Ito Other Clinician: Referring Garritt Molyneux: Karl Ito Treating Peder Allums/Extender: Yaakov Guthrie in Treatment: 1 Encounter Discharge Information Items Post Procedure Vitals Discharge Condition: Stable Temperature (F): 97.9 Ambulatory Status: Ambulatory Pulse (bpm): 76 Discharge Destination: Home Respiratory Rate (breaths/min): 16 Transportation: Private Auto Blood Pressure (mmHg): 118/75 Accompanied By: daughter Schedule Follow-up Appointment: Yes Clinical Summary of Care: Electronic Signature(s) Signed: 08/13/2021 12:29:48 PM By: Donnamarie Poag Entered By: Donnamarie Poag on 08/13/2021 10:18:07 [name] ([id number]) -------------------------------------------------------------------------------- Lower Extremity Assessment Details Patient Name: [name], [name]. Date of Service: 08/13/2021 9:00 AM Medical Record Number: [id number] Patient Account Number: [id number] Date of Birth/Sex: [birth year] (60 y.o. M) Treating RN: Donnamarie Poag Primary Care Joleene Burnham: Karl Ito Other Clinician: Referring Laakea Pereira: Karl Ito Treating Suzzanne Brunkhorst/Extender: Yaakov Guthrie in Treatment: 1 Edema Assessment Assessed: [Left: Yes] [Right: Yes] Edema: [Left: No] [Right: No] Calf Left: Right: Point of Measurement: 32 cm From Medial Instep 35 cm 33 cm Ankle Left: Right: Point of Measurement: 10 cm From Medial Instep 20.5 cm 21 cm Knee To Floor Left: Right: From Medial Instep 41 cm 41 cm Vascular Assessment Pulses: Dorsalis Pedis Palpable: [Left:Yes] [Right:Yes] Electronic  Signature(s) Signed: 08/13/2021 12:29:48 PM By: Donnamarie Poag Entered By: Donnamarie Poag on 08/13/2021 09:22:46 [name] ([id number]) -------------------------------------------------------------------------------- Multi Wound Chart Details Patient Name: [name] Date of Service: 08/13/2021 9:00 AM Medical Record Number: [id number] Patient Account Number: [id number] Date of Birth/Sex: [birth year] (60 y.o. M) Treating RN: Donnamarie Poag Primary Care Starlit Raburn: Karl Ito Other Clinician: Referring Shayli Altemose: Karl Ito Treating Leiloni Smithers/Extender: Yaakov Guthrie in Treatment: 1 Vital Signs Height(in): 63 Pulse(bpm): 10 Weight(lbs): 180 Blood Pressure(mmHg): 118/75 Body Mass Index(BMI): 30 Temperature(F): 97.9 Respiratory Rate(breaths/min): 16 Photos: [1:No Photos] [2:No Photos] [3:No Photos] Wound Location: [1:Right, Posterior Lower Leg] [2:Right, Medial Lower Leg] [3:Right, Distal Lower Leg] Wounding Event: [1:Gradually Appeared] [2:Gradually Appeared] [3:Gradually Appeared] Primary Etiology: [1:Diabetic Wound/Ulcer of the Lower Diabetic Wound/Ulcer of the Lower Diabetic Wound/Ulcer of the Lower Extremity] [2:Extremity] [3:Extremity] Comorbid History: [1:Coronary Artery Disease, Hypertension, Myocardial Infarction, Hypertension, Myocardial Infarction, Hypertension, Myocardial Infarction, Type II Diabetes] [2:Coronary Artery Disease, Type II Diabetes] [3:Coronary Artery Disease, Type  II Diabetes] Date Acquired: [1:07/19/2021] [2:07/19/2021] [3:07/19/2021] Weeks of Treatment: [1:1] [2:1] [3:1] Wound Status: [1:Open] [2:Open] [3:Open] Measurements L x W x D (cm) [1:3.2x3.4x0.1] [2:2x1.5x0.1] [3:0.7x1x0.1] Area (cm) : [1:8.545] [2:2.356] [3:0.55] Volume (cm) : [1:0.855] [2:0.236] [3:0.055] % Reduction in Area: [1:22.30%] [2:23.10%] [3:33.30%] % Reduction in Volume: [1:22.30%] [2:22.90%] [3:66.70%] Classification: [1:Grade 2] [2:Grade 2] [3:Grade 2] Exudate Amount:  [1:None Present] [2:None Present] [3:None Present] Granulation Amount: [1:None Present (0%)] [2:None Present (0%)] [3:None Present (0%)] Necrotic Amount: [1:Large (67-100%)] [2:Large (67-100%)] [3:Large (67-100%)] Necrotic Tissue: [1:Eschar] [2:Eschar] [3:Eschar] Exposed Structures: [1:Fat Layer (Subcutaneous Tissue): Fascia: No Yes Fascia: No Tendon: No Muscle: No Joint: No Bone: No] [2:Fat Layer (Subcutaneous Tissue): Yes No Tendon: No Muscle: No Joint: No Bone: No] [3:Fat Layer (Subcutaneous Tissue): Fascia: No Tendon: No Muscle:  No Joint: No Bone: No] Epithelialization: [1:None] [2:None] [3:None] Debridement: [1:Debridement - Selective/Open Wound] [2:Debridement - Selective/Open Wound] [3:Debridement - Selective/Open Wound] Pre-procedure Verification/Time 09:56 [2:09:56] [3:09:56] Out Taken: Pain Control: [1:Lidocaine] [2:Lidocaine] [3:Lidocaine] Tissue Debrided: [1:Necrotic/Eschar] [2:Necrotic/Eschar] [3:Necrotic/Eschar] Level: [1:Skin/Dermis] [2:Skin/Dermis] [3:Skin/Dermis] Debridement Area (sq cm): [1:0.25] [2:0.25] [3:0.25] Instrument: [1:Blade, Forceps, Scissors] [2:Blade, Forceps, Scissors] [3:Blade, Forceps, Scissors] Bleeding: [1:Minimum] [2:Minimum] [3:Minimum] Hemostasis Achieved: [1:Pressure] [2:Pressure] [3:Pressure] Debridement Treatment [1:Procedure was tolerated  well] [2:Procedure was tolerated well] [3:Procedure was tolerated well] Response: Post Debridement [1:3.2x3.4x0.1] [2:2x1.5x0.1] [3:0.7x1x0.1] Measurements L x W x D (cm) Post Debridement Volume: [1:0.855] [2:0.236] [3:0.055] (cm) Procedures Performed: [1:Debridement] [2:Debridement] [3:Debridement] Wound Number: [id number] N/A Photos: No Photos No Photos N/A Wound Location: Right, Lateral Lower Leg Left, Medial Lower Leg N/A Wounding Event: Gradually Appeared Gradually Appeared N/A Primary Etiology: N/A CLEMENTE, DEWEY ([id number]) Diabetic Wound/Ulcer of the Lower Diabetic Wound/Ulcer of the Lower Extremity  Extremity Comorbid History: Coronary Artery Disease, Coronary Artery Disease, N/A Hypertension, Myocardial Infarction, Hypertension, Myocardial Infarction, Type II Diabetes Type II Diabetes Date Acquired: 07/19/2021 07/19/2021 N/A Weeks of Treatment: 1 1 N/A Wound Status: Open Open N/A Measurements L x W x D (cm) 1.3x0.7x0.1 1x1.2x0.1 N/A Area (cm) : 0.715 0.942 N/A Volume (cm) : 0.071 0.094 N/A % Reduction in Area: -1.10% -20.00% N/A % Reduction in Volume: 0.00% -19.00% N/A Classification: Grade 2 Grade 2 N/A Exudate Amount: None Present None Present N/A Granulation Amount: None Present (0%) None Present (0%) N/A Necrotic Amount: Large (67-100%) Large (67-100%) N/A Necrotic Tissue: Eschar Eschar N/A Exposed Structures: Fascia: No Fascia: No N/A Fat Layer (Subcutaneous Tissue): Fat Layer (Subcutaneous Tissue): No No Tendon: No Tendon: No Muscle: No Muscle: No Joint: No Joint: No Bone: No Bone: No Epithelialization: None None N/A Debridement: Debridement - Selective/Open Debridement - Selective/Open N/A Wound Wound Pre-procedure Verification/Time 09:56 09:56 N/A Out Taken: Pain Control: Lidocaine Lidocaine N/A Tissue Debrided: Necrotic/Eschar Necrotic/Eschar N/A Level: Skin/Dermis Skin/Dermis N/A Debridement Area (sq cm): 0.25 0.25 N/A Instrument: Blade, Forceps, Scissors Blade, Forceps, Scissors N/A Bleeding: Minimum Minimum N/A Hemostasis Achieved: Pressure Pressure N/A Debridement Treatment Procedure was tolerated well Procedure was tolerated well N/A Response: Post Debridement 1.3x0.7x0.1 1x1.2x0.1 N/A Measurements L x W x D (cm) Post Debridement Volume: 0.071 0.094 N/A (cm) Procedures Performed: Debridement Debridement N/A Treatment Notes Wound #1 (Lower Leg) Wound Laterality: Right, Posterior Cleanser Byram Ancillary Kit - 15 Day Supply Discharge Instruction: Use supplies as instructed; Kit contains: (15) Saline Bullets; (15) 3x3 Gauze; 15 pr Gloves Soap  and Water Discharge Instruction: Gently cleanse wound with antibacterial soap, rinse and pat dry prior to dressing wounds Peri-Wound Care Topical Primary Dressing Gauze Discharge Instruction: As directed: dry, moistened with saline or moistened with Dakins Solution Santyl Collagenase Ointment, 30 (gm), tube Secondary Dressing Kerlix 4.5 x 4.1 (in/yd) Discharge Instruction: Apply Kerlix 4.5 x 4.1 (in/yd) as instructed Secured With 12M Medipore H Soft Cloth Surgical Tape, 2x2 (in/yd) CINCH, ORMOND ([id number]) Compression Wrap Compression Stockings Add-Ons Wound #2 (Lower Leg) Wound Laterality: Right, Medial Cleanser Byram Ancillary Kit - 15 Day Supply Discharge Instruction: Use supplies as instructed; Kit contains: (15) Saline Bullets; (15) 3x3 Gauze; 15 pr Gloves Soap and Water Discharge Instruction: Gently cleanse wound with antibacterial soap, rinse and pat dry prior to dressing wounds Peri-Wound Care Topical Primary Dressing Gauze Discharge Instruction: As directed: dry, moistened with saline or moistened with Dakins Solution Santyl Collagenase Ointment, 30 (gm), tube Secondary Dressing Kerlix 4.5 x 4.1 (in/yd) Discharge Instruction: Apply Kerlix 4.5 x 4.1 (in/yd) as instructed Secured With 12M Medipore H Soft Cloth Surgical Tape, 2x2 (in/yd) Compression Wrap Compression Stockings Add-Ons Wound #3 (Lower Leg) Wound Laterality: Right, Distal Cleanser Byram Ancillary Kit - 15 Day Supply Discharge Instruction: Use supplies as instructed; Kit contains: (15) Saline Bullets; (15) 3x3 Gauze; 15 pr Gloves Soap and Water Discharge Instruction: Gently cleanse wound with antibacterial soap, rinse and pat dry prior to dressing wounds Peri-Wound Care Topical  Primary Dressing Gauze Discharge Instruction: As directed: dry, moistened with saline or moistened with Dakins Solution Santyl Collagenase Ointment, 30 (gm), tube Secondary Dressing Kerlix 4.5 x 4.1 (in/yd) Discharge  Instruction: Apply Kerlix 4.5 x 4.1 (in/yd) as instructed Secured With 71M Medipore H Soft Cloth Surgical Tape, 2x2 (in/yd) Compression Wrap Compression Stockings Add-Ons Wound #4 (Lower Leg) Wound Laterality: Right, Lateral BRIGHTEN, ORNDOFF ([id number]) Cleanser Byram Ancillary Kit - 15 Day Supply Discharge Instruction: Use supplies as instructed; Kit contains: (15) Saline Bullets; (15) 3x3 Gauze; 15 pr Gloves Soap and Water Discharge Instruction: Gently cleanse wound with antibacterial soap, rinse and pat dry prior to dressing wounds Peri-Wound Care Topical Primary Dressing Gauze Discharge Instruction: As directed: dry, moistened with saline or moistened with Dakins Solution Santyl Collagenase Ointment, 30 (gm), tube Secondary Dressing Kerlix 4.5 x 4.1 (in/yd) Discharge Instruction: Apply Kerlix 4.5 x 4.1 (in/yd) as instructed Secured With 71M Medipore H Soft Cloth Surgical Tape, 2x2 (in/yd) Compression Wrap Compression Stockings Add-Ons Wound #5 (Lower Leg) Wound Laterality: Left, Medial Cleanser Byram Ancillary Kit - 15 Day Supply Discharge Instruction: Use supplies as instructed; Kit contains: (15) Saline Bullets; (15) 3x3 Gauze; 15 pr Gloves Soap and Water Discharge Instruction: Gently cleanse wound with antibacterial soap, rinse and pat dry prior to dressing wounds Peri-Wound Care Topical Primary Dressing Gauze Discharge Instruction: As directed: dry, moistened with saline or moistened with Dakins Solution Santyl Collagenase Ointment, 30 (gm), tube Secondary Dressing Kerlix 4.5 x 4.1 (in/yd) Discharge Instruction: Apply Kerlix 4.5 x 4.1 (in/yd) as instructed Secured With 71M Medipore H Soft Cloth Surgical Tape, 2x2 (in/yd) Compression Wrap Compression Stockings Add-Ons Electronic Signature(s) Signed: 08/13/2021 10:59:15 AM By: Kalman Shan DO Entered By: Kalman Shan on 08/13/2021 10:53:52 [name]  ([id number]) -------------------------------------------------------------------------------- [place] Details Patient Name: NORTON, BIVINS. Date of Service: 08/13/2021 9:00 AM Medical Record Number: [id number] Patient Account Number: [id number] Date of Birth/Sex: 09-25-1961 (60 y.o. M) Treating RN: Donnamarie Poag Primary Care Samona Chihuahua: Karl Ito Other Clinician: Referring Nial Hawe: Karl Ito Treating Karinna Beadles/Extender: Yaakov Guthrie in Treatment: 1 Active Inactive Wound/Skin Impairment Nursing Diagnoses: Knowledge deficit related to ulceration/compromised skin integrity Goals: Patient/caregiver will verbalize understanding of skin care regimen Date Initiated: 08/06/2021 Date Inactivated: 08/13/2021 Target Resolution Date: 09/06/2021 Goal Status: Met Ulcer/skin breakdown will have a volume reduction of 30% by week 4 Date Initiated: 08/06/2021 Target Resolution Date: 10/06/2021 Goal Status: Active Ulcer/skin breakdown will have a volume reduction of 50% by week 8 Date Initiated: 08/06/2021 Target Resolution Date: 11/06/2021 Goal Status: Active Ulcer/skin breakdown will have a volume reduction of 80% by week 12 Date Initiated: 08/06/2021 Target Resolution Date: 12/07/2021 Goal Status: Active Ulcer/skin breakdown will heal within 14 weeks Date Initiated: 08/06/2021 Target Resolution Date: 01/04/2022 Goal Status: Active Interventions: Assess patient/caregiver ability to obtain necessary supplies Assess patient/caregiver ability to perform ulcer/skin care regimen upon admission and as needed Assess ulceration(s) every visit Notes: Electronic Signature(s) Signed: 08/13/2021 12:29:48 PM By: Donnamarie Poag Entered By: Donnamarie Poag on 08/13/2021 09:23:13 [name] ([id number]) -------------------------------------------------------------------------------- Pain Assessment Details Patient Name: LINZY, DARLING. Date of Service: 08/13/2021 9:00  AM Medical Record Number: [id number] Patient Account Number: [id number] Date of Birth/Sex: 04-Jan-1961 (60 y.o. M) Treating RN: Donnamarie Poag Primary Care Ousmane Seeman: Karl Ito Other Clinician: Referring Mayme Profeta: Karl Ito Treating Kimmie Doren/Extender: Yaakov Guthrie in Treatment: 1 Active Problems Location of Pain Severity and Description of Pain Patient Has Paino No Site Locations Rate the pain. Current Pain Level: 0 Pain Management and  Medication Current Pain Management: Electronic Signature(s) Signed: 08/13/2021 12:29:48 PM By: Donnamarie Poag Entered By: Donnamarie Poag on 08/13/2021 09:13:30 [name] ([id number]) -------------------------------------------------------------------------------- Patient/Caregiver Education Details Patient Name: LATHAN, GIESELMAN. Date of Service: 08/13/2021 9:00 AM Medical Record Number: [id number] Patient Account Number: [id number] Date of Birth/Gender: Jan 31, 1961 (60 y.o. M) Treating RN: Donnamarie Poag Primary Care Physician: Karl Ito Other Clinician: Referring Physician: Karl Ito Treating Physician/Extender: Yaakov Guthrie in Treatment: 1 Education Assessment Education Provided To: Patient Education Topics Provided Basic Hygiene: Elevated Blood Sugar/ Impact on Healing: Nutrition: Smoking and Wound Healing: Wound Debridement: Wound/Skin Impairment: Electronic Signature(s) Signed: 08/13/2021 12:29:48 PM By: Donnamarie Poag Entered By: Donnamarie Poag on 08/13/2021 10:17:10 [name] ([id number]) -------------------------------------------------------------------------------- Wound Assessment Details Patient Name: HARUKI, ARNOLD. Date of Service: 08/13/2021 9:00 AM Medical Record Number: [id number] Patient Account Number: [id number] Date of Birth/Sex: [birth year] (60 y.o. M) Treating RN: Donnamarie Poag Primary Care Suren Payne: Karl Ito Other Clinician: Referring Rockelle Heuerman: Karl Ito Treating Braedan Meuth/Extender:  Yaakov Guthrie in Treatment: 1 Wound Status Wound Number: 1 Primary Diabetic Wound/Ulcer of the Lower Extremity Etiology: Wound Location: Right, Posterior Lower Leg Wound Status: Open Wounding Event: Gradually Appeared Comorbid Coronary Artery Disease, Hypertension, Myocardial Date Acquired: 07/19/2021 History: Infarction, Type II Diabetes Weeks Of Treatment: 1 Clustered Wound: No Photos Wound Measurements Length: (cm) 3.2 Width: (cm) 3.4 Depth: (cm) 0.1 Area: (cm) 8.545 Volume: (cm) 0.855 % Reduction in Area: 22.3% % Reduction in Volume: 22.3% Epithelialization: None Tunneling: No Undermining: No Wound Description Classification: Grade 2 Exudate Amount: None Present Foul Odor After Cleansing: No Slough/Fibrino Yes Wound Bed Granulation Amount: None Present (0%) Exposed Structure Necrotic Amount: Large (67-100%) Fascia Exposed: No Necrotic Quality: Eschar Fat Layer (Subcutaneous Tissue) Exposed: Yes Tendon Exposed: No Muscle Exposed: No Joint Exposed: No Bone Exposed: No Treatment Notes Wound #1 (Lower Leg) Wound Laterality: Right, Posterior Cleanser Byram Ancillary Kit - 15 Day Supply Discharge Instruction: Use supplies as instructed; Kit contains: (15) Saline Bullets; (15) 3x3 Gauze; 15 pr Gloves Soap and Water Discharge Instruction: Gently cleanse wound with antibacterial soap, rinse and pat dry prior to dressing wounds SALIM, FORERO ([id number]) Peri-Wound Care Topical Primary Dressing Gauze Discharge Instruction: As directed: dry, moistened with saline or moistened with Dakins Solution Santyl Collagenase Ointment, 30 (gm), tube Secondary Dressing Kerlix 4.5 x 4.1 (in/yd) Discharge Instruction: Apply Kerlix 4.5 x 4.1 (in/yd) as instructed Secured With 68M Medipore H Soft Cloth Surgical Tape, 2x2 (in/yd) Compression Wrap Compression Stockings Add-Ons Electronic Signature(s) Signed: 08/13/2021 4:22:06 PM By: Donnamarie Poag Previous Signature:  08/13/2021 12:29:48 PM Version By: Donnamarie Poag Entered By: Donnamarie Poag on 08/13/2021 16:18:01 [name] ([id number]) -------------------------------------------------------------------------------- Wound Assessment Details Patient Name: LAMONTE, HARTT. Date of Service: 08/13/2021 9:00 AM Medical Record Number: [id number] Patient Account Number: [id number] Date of Birth/Sex: 07/26/1961 (60 y.o. M) Treating RN: Donnamarie Poag Primary Care Tavaria Mackins: Karl Ito Other Clinician: Referring Nhi Butrum: Karl Ito Treating Aydenn Gervin/Extender: Yaakov Guthrie in Treatment: 1 Wound Status Wound Number: 2 Primary Diabetic Wound/Ulcer of the Lower Extremity Etiology: Wound Location: Right, Medial Lower Leg Wound Status: Open Wounding Event: Gradually Appeared Comorbid Coronary Artery Disease, Hypertension, Myocardial Date Acquired: 07/19/2021 History: Infarction, Type II Diabetes Weeks Of Treatment: 1 Clustered Wound: No Photos Wound Measurements Length: (cm) 2 Width: (cm) 1.5 Depth: (cm) 0.1 Area: (cm) 2.356 Volume: (cm) 0.236 % Reduction in Area: 23.1% % Reduction in Volume: 22.9% Epithelialization: None Tunneling: No Undermining: No Wound Description Classification: Grade 2 Exudate Amount: None Present  Foul Odor After Cleansing: No Slough/Fibrino Yes Wound Bed Granulation Amount: None Present (0%) Exposed Structure Necrotic Amount: Large (67-100%) Fascia Exposed: No Necrotic Quality: Eschar Fat Layer (Subcutaneous Tissue) Exposed: No Tendon Exposed: No Muscle Exposed: No Joint Exposed: No Bone Exposed: No Treatment Notes Wound #2 (Lower Leg) Wound Laterality: Right, Medial Cleanser Byram Ancillary Kit - 15 Day Supply Discharge Instruction: Use supplies as instructed; Kit contains: (15) Saline Bullets; (15) 3x3 Gauze; 15 pr Gloves Soap and Water Discharge Instruction: Gently cleanse wound with antibacterial soap, rinse and pat dry prior to dressing  wounds SHREY, BOIKE ([id number]) Peri-Wound Care Topical Primary Dressing Gauze Discharge Instruction: As directed: dry, moistened with saline or moistened with Dakins Solution Santyl Collagenase Ointment, 30 (gm), tube Secondary Dressing Kerlix 4.5 x 4.1 (in/yd) Discharge Instruction: Apply Kerlix 4.5 x 4.1 (in/yd) as instructed Secured With 65M Medipore H Soft Cloth Surgical Tape, 2x2 (in/yd) Compression Wrap Compression Stockings Add-Ons Electronic Signature(s) Signed: 08/13/2021 4:22:06 PM By: Donnamarie Poag Previous Signature: 08/13/2021 12:29:48 PM Version By: Donnamarie Poag Entered By: Donnamarie Poag on 08/13/2021 16:18:27 [name] ([id number]) -------------------------------------------------------------------------------- Wound Assessment Details Patient Name: KAINALU, HEGGS. Date of Service: 08/13/2021 9:00 AM Medical Record Number: [id number] Patient Account Number: [id number] Date of Birth/Sex: 1961/09/21 (60 y.o. M) Treating RN: Donnamarie Poag Primary Care Kalik Hoare: Karl Ito Other Clinician: Referring Chaka Boyson: Karl Ito Treating Keniel Ralston/Extender: Yaakov Guthrie in Treatment: 1 Wound Status Wound Number: 3 Primary Diabetic Wound/Ulcer of the Lower Extremity Etiology: Wound Location: Right, Distal Lower Leg Wound Status: Open Wounding Event: Gradually Appeared Comorbid Coronary Artery Disease, Hypertension, Myocardial Date Acquired: 07/19/2021 History: Infarction, Type II Diabetes Weeks Of Treatment: 1 Clustered Wound: No Photos Photo Uploaded ByDonnamarie Poag on 08/13/2021 16:19:09 Wound Measurements Length: (cm) 0.7 Width: (cm) 1 Depth: (cm) 0.1 Area: (cm) 0.55 Volume: (cm) 0.055 % Reduction in Area: 33.3% % Reduction in Volume: 66.7% Epithelialization: None Tunneling: No Undermining: No Wound Description Classification: Grade 2 Exudate Amount: None Present Foul Odor After Cleansing: No Slough/Fibrino Yes Wound Bed Granulation  Amount: None Present (0%) Exposed Structure Necrotic Amount: Large (67-100%) Fascia Exposed: No Necrotic Quality: Eschar Fat Layer (Subcutaneous Tissue) Exposed: Yes Tendon Exposed: No Muscle Exposed: No Joint Exposed: No Bone Exposed: No Treatment Notes Wound #3 (Lower Leg) Wound Laterality: Right, Distal Cleanser Byram Ancillary Kit - 15 Day Supply Discharge Instruction: Use supplies as instructed; Kit contains: (15) Saline Bullets; (15) 3x3 Gauze; 15 pr Gloves Soap and Water Discharge Instruction: Gently cleanse wound with antibacterial soap, rinse and pat dry prior to dressing wounds LAZLO, TUNNEY ([id number]) Peri-Wound Care Topical Primary Dressing Gauze Discharge Instruction: As directed: dry, moistened with saline or moistened with Dakins Solution Santyl Collagenase Ointment, 30 (gm), tube Secondary Dressing Kerlix 4.5 x 4.1 (in/yd) Discharge Instruction: Apply Kerlix 4.5 x 4.1 (in/yd) as instructed Secured With 65M Medipore H Soft Cloth Surgical Tape, 2x2 (in/yd) Compression Wrap Compression Stockings Add-Ons Electronic Signature(s) Signed: 08/13/2021 12:29:48 PM By: Donnamarie Poag Entered By: Donnamarie Poag on 08/13/2021 09:19:54 [name] ([id number]) -------------------------------------------------------------------------------- Wound Assessment Details Patient Name: KAIAN, FAHS. Date of Service: 08/13/2021 9:00 AM Medical Record Number: [id number] Patient Account Number: [id number] Date of Birth/Sex: [birth year] (60 y.o. M) Treating RN: Donnamarie Poag Primary Care Tannah Dreyfuss: Karl Ito Other Clinician: Referring Sylver Vantassell: Karl Ito Treating Notnamed Croucher/Extender: Yaakov Guthrie in Treatment: 1 Wound Status Wound Number: 4 Primary Diabetic Wound/Ulcer of the Lower Extremity Etiology: Wound Location: Right, Lateral Lower Leg Wound Status: Open Wounding Event: Gradually  Appeared Comorbid Coronary Artery Disease, Hypertension, Myocardial Date  Acquired: 07/19/2021 History: Infarction, Type II Diabetes Weeks Of Treatment: 1 Clustered Wound: No Photos Photo Uploaded By: Donnamarie Poag on 08/13/2021 16:19:32 Wound Measurements Length: (cm) 1.3 Width: (cm) 0.7 Depth: (cm) 0.1 Area: (cm) 0.715 Volume: (cm) 0.071 % Reduction in Area: -1.1% % Reduction in Volume: 0% Epithelialization: None Tunneling: No Undermining: No Wound Description Classification: Grade 2 Exudate Amount: None Present Foul Odor After Cleansing: No Slough/Fibrino Yes Wound Bed Granulation Amount: None Present (0%) Exposed Structure Necrotic Amount: Large (67-100%) Fascia Exposed: No Necrotic Quality: Eschar Fat Layer (Subcutaneous Tissue) Exposed: No Tendon Exposed: No Muscle Exposed: No Joint Exposed: No Bone Exposed: No Treatment Notes Wound #4 (Lower Leg) Wound Laterality: Right, Lateral Cleanser Byram Ancillary Kit - 15 Day Supply Discharge Instruction: Use supplies as instructed; Kit contains: (15) Saline Bullets; (15) 3x3 Gauze; 15 pr Gloves Soap and Water Discharge Instruction: Gently cleanse wound with antibacterial soap, rinse and pat dry prior to dressing wounds JAVONNIE, ILLESCAS ([id number]) Peri-Wound Care Topical Primary Dressing Gauze Discharge Instruction: As directed: dry, moistened with saline or moistened with Dakins Solution Santyl Collagenase Ointment, 30 (gm), tube Secondary Dressing Kerlix 4.5 x 4.1 (in/yd) Discharge Instruction: Apply Kerlix 4.5 x 4.1 (in/yd) as instructed Secured With 96M Medipore H Soft Cloth Surgical Tape, 2x2 (in/yd) Compression Wrap Compression Stockings Add-Ons Electronic Signature(s) Signed: 08/13/2021 12:29:48 PM By: Donnamarie Poag Entered By: Donnamarie Poag on 08/13/2021 09:20:37 [name] ([id number]) -------------------------------------------------------------------------------- Wound Assessment Details Patient Name: BENJAMEN, KOELLING. Date of Service: 08/13/2021 9:00 AM Medical Record  Number: [id number] Patient Account Number: [id number] Date of Birth/Sex: [birth year] (60 y.o. M) Treating RN: Donnamarie Poag Primary Care Angeline Trick: Karl Ito Other Clinician: Referring Gil Ingwersen: Karl Ito Treating Roberta Kelly/Extender: Yaakov Guthrie in Treatment: 1 Wound Status Wound Number: 5 Primary Diabetic Wound/Ulcer of the Lower Extremity Etiology: Wound Location: Left, Medial Lower Leg Wound Status: Open Wounding Event: Gradually Appeared Comorbid Coronary Artery Disease, Hypertension, Myocardial Date Acquired: 07/19/2021 History: Infarction, Type II Diabetes Weeks Of Treatment: 1 Clustered Wound: No Photos Photo Uploaded By: Donnamarie Poag on 08/13/2021 16:19:48 Wound Measurements Length: (cm) 1 Width: (cm) 1.2 Depth: (cm) 0.1 Area: (cm) 0.942 Volume: (cm) 0.094 % Reduction in Area: -20% % Reduction in Volume: -19% Epithelialization: None Tunneling: No Undermining: No Wound Description Classification: Grade 2 Exudate Amount: None Present Foul Odor After Cleansing: No Slough/Fibrino Yes Wound Bed Granulation Amount: None Present (0%) Exposed Structure Necrotic Amount: Large (67-100%) Fascia Exposed: No Necrotic Quality: Eschar Fat Layer (Subcutaneous Tissue) Exposed: No Tendon Exposed: No Muscle Exposed: No Joint Exposed: No Bone Exposed: No Treatment Notes Wound #5 (Lower Leg) Wound Laterality: Left, Medial Cleanser Byram Ancillary Kit - 15 Day Supply Discharge Instruction: Use supplies as instructed; Kit contains: (15) Saline Bullets; (15) 3x3 Gauze; 15 pr Gloves Soap and Water Discharge Instruction: Gently cleanse wound with antibacterial soap, rinse and pat dry prior to dressing wounds JAVAD, SALVA ([id number]) Peri-Wound Care Topical Primary Dressing Gauze Discharge Instruction: As directed: dry, moistened with saline or moistened with Dakins Solution Santyl Collagenase Ointment, 30 (gm), tube Secondary Dressing Kerlix 4.5 x 4.1  (in/yd) Discharge Instruction: Apply Kerlix 4.5 x 4.1 (in/yd) as instructed Secured With 96M Medipore H Soft Cloth Surgical Tape, 2x2 (in/yd) Compression Wrap Compression Stockings Add-Ons Electronic Signature(s) Signed: 08/13/2021 12:29:48 PM By: Donnamarie Poag Entered By: Donnamarie Poag on 08/13/2021 09:21:01 [name] ([id number]) -------------------------------------------------------------------------------- [place] Details Patient Name: [name] Date of Service: 08/13/2021 9:00  AM Medical Record Number: [id number] Patient Account Number: [id number] Date of Birth/Sex: 07/17/1961 (60 y.o. M) Treating RN: Donnamarie Poag Primary Care Fabiano Ginley: Karl Ito Other Clinician: Referring Patrick Sohm: Karl Ito Treating Glendel Jaggers/Extender: Yaakov Guthrie in Treatment: 1 Vital Signs Time Taken: 09:13 Temperature (F): 97.9 Height (in): 65 Pulse (bpm): 76 Weight (lbs): 180 Respiratory Rate (breaths/min): 16 Body Mass Index (BMI): 30 Blood Pressure (mmHg): 118/75 Reference Range: 80 - 120 mg / dl Electronic Signature(s) Signed: 08/13/2021 12:29:48 PM By: Donnamarie Poag Entered ByDonnamarie Poag on 08/13/2021 09:13:23

## 2021-08-14 ENCOUNTER — Other Ambulatory Visit (INDEPENDENT_AMBULATORY_CARE_PROVIDER_SITE_OTHER): Payer: Self-pay | Admitting: Internal Medicine

## 2021-08-14 DIAGNOSIS — L97919 Non-pressure chronic ulcer of unspecified part of right lower leg with unspecified severity: Secondary | ICD-10-CM

## 2021-08-14 DIAGNOSIS — L97829 Non-pressure chronic ulcer of other part of left lower leg with unspecified severity: Secondary | ICD-10-CM

## 2021-08-20 ENCOUNTER — Encounter: Payer: Medicare Other | Attending: Internal Medicine | Admitting: Internal Medicine

## 2021-08-20 ENCOUNTER — Other Ambulatory Visit: Payer: Self-pay

## 2021-08-20 DIAGNOSIS — Z89422 Acquired absence of other left toe(s): Secondary | ICD-10-CM | POA: Insufficient documentation

## 2021-08-20 DIAGNOSIS — F17210 Nicotine dependence, cigarettes, uncomplicated: Secondary | ICD-10-CM | POA: Diagnosis not present

## 2021-08-20 DIAGNOSIS — I110 Hypertensive heart disease with heart failure: Secondary | ICD-10-CM | POA: Diagnosis not present

## 2021-08-20 DIAGNOSIS — E119 Type 2 diabetes mellitus without complications: Secondary | ICD-10-CM | POA: Diagnosis not present

## 2021-08-20 DIAGNOSIS — L97829 Non-pressure chronic ulcer of other part of left lower leg with unspecified severity: Secondary | ICD-10-CM | POA: Diagnosis not present

## 2021-08-20 DIAGNOSIS — Z951 Presence of aortocoronary bypass graft: Secondary | ICD-10-CM | POA: Diagnosis not present

## 2021-08-20 DIAGNOSIS — I5022 Chronic systolic (congestive) heart failure: Secondary | ICD-10-CM | POA: Diagnosis not present

## 2021-08-20 DIAGNOSIS — I25119 Atherosclerotic heart disease of native coronary artery with unspecified angina pectoris: Secondary | ICD-10-CM | POA: Diagnosis not present

## 2021-08-20 DIAGNOSIS — E11622 Type 2 diabetes mellitus with other skin ulcer: Secondary | ICD-10-CM | POA: Diagnosis present

## 2021-08-20 DIAGNOSIS — L97919 Non-pressure chronic ulcer of unspecified part of right lower leg with unspecified severity: Secondary | ICD-10-CM | POA: Insufficient documentation

## 2021-08-21 ENCOUNTER — Encounter: Payer: Self-pay | Admitting: Sports Medicine

## 2021-08-21 ENCOUNTER — Ambulatory Visit (INDEPENDENT_AMBULATORY_CARE_PROVIDER_SITE_OTHER): Payer: Medicare Other | Admitting: Sports Medicine

## 2021-08-21 DIAGNOSIS — B351 Tinea unguium: Secondary | ICD-10-CM | POA: Diagnosis not present

## 2021-08-21 DIAGNOSIS — M79609 Pain in unspecified limb: Secondary | ICD-10-CM

## 2021-08-21 DIAGNOSIS — E1142 Type 2 diabetes mellitus with diabetic polyneuropathy: Secondary | ICD-10-CM

## 2021-08-21 DIAGNOSIS — Z89422 Acquired absence of other left toe(s): Secondary | ICD-10-CM

## 2021-08-21 DIAGNOSIS — L84 Corns and callosities: Secondary | ICD-10-CM
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
## 2021-08-22 NOTE — Progress Notes (Signed)
[name], [name] ([id number]) Visit Report for 08/20/2021 Arrival Information Details Patient Name: [name], [name]. Date of Service: 08/20/2021 12:30 PM Medical Record Number: [id number] Patient Account Number: [id number] Date of Birth/Sex: 1960-11-06 (60 y.o. M) Treating [name]: [name] Primary Care [name]: [name] Other Clinician: Referring [name]: [name] Treating [name]/Extender: [name] in Treatment: 2 Visit Information History Since Last Visit All ordered tests and consults were completed: No Patient Arrived: Ambulatory Added or deleted any medications: No Arrival Time: 12:40 Any new allergies or adverse reactions: No Accompanied By: daughter Had a fall or experienced change in No Transfer Assistance: None activities of daily living that may affect Patient Identification Verified: Yes risk of falls: Secondary Verification Process Completed: Yes Signs or symptoms of abuse/neglect since last visito No Patient Requires Transmission-Based Precautions: No Hospitalized since last visit: No Patient Has Alerts: No Implantable device outside of the clinic excluding No cellular tissue based products placed in the center since last visit: Has Dressing in Place as Prescribed: Yes Has Compression in Place as Prescribed: Yes Pain Present Now: No Electronic Signature(s) Signed: 08/21/2021 5:07:02 PM By: Derrick Coria [name] Entered By: [name] on 08/20/2021 12:44:56 [name] ([id number]) -------------------------------------------------------------------------------- Clinic Level of Care Assessment Details Patient Name: [name], [name]. Date of Service: 08/20/2021 12:30 PM Medical Record Number: [id number] Patient Account Number: [id number] Date of Birth/Sex: 11-13-1960 (60 y.o. M) Treating [name]: [name] Primary Care [name]: [name] Other Clinician: Referring [name]: [name] Treating [name]/Extender: [name] in  Treatment: 2 Clinic Level of Care Assessment Items TOOL 1 Quantity Score [] - Use when EandM and Procedure is performed on INITIAL visit 0 ASSESSMENTS - Nursing Assessment / Reassessment [] - General Physical Exam (combine w/ comprehensive assessment (listed just below) when performed on new 0 pt. evals) [] - 0 Comprehensive Assessment (HX, ROS, Risk Assessments, Wounds Hx, etc.) ASSESSMENTS - Wound and Skin Assessment / Reassessment [] - Dermatologic / Skin Assessment (not related to wound area) 0 ASSESSMENTS - Ostomy and/or Continence Assessment and Care [] - Incontinence Assessment and Management 0 [] - 0 Ostomy Care Assessment and Management (repouching, etc.) PROCESS - Coordination of Care [] - Simple Patient / Family Education for ongoing care 0 [] - 0 Complex (extensive) Patient / Family Education for ongoing care [] - 0 Staff obtains Programmer, systems, Records, Test Results / Process Orders [] - 0 Staff telephones HHA, Nursing Homes / Clarify orders / etc [] - 0 Routine Transfer to another Facility (non-emergent condition) [] - 0 Routine Hospital Admission (non-emergent condition) [] - 0 New Admissions / Biomedical engineer / Ordering NPWT, Apligraf, etc. [] - 0 Emergency Hospital Admission (emergent condition) PROCESS - Special Needs [] - Pediatric / Minor Patient Management 0 [] - 0 Isolation Patient Management [] - 0 Hearing / Language / Visual special needs [] - 0 Assessment of Community assistance (transportation, D/C planning, etc.) [] - 0 Additional assistance / Altered mentation [] - 0 Support Surface(s) Assessment (bed, cushion, seat, etc.) INTERVENTIONS - Miscellaneous [] - External ear exam 0 [] - 0 Patient Transfer (multiple staff / Civil Service fast streamer / Similar devices) [] - 0 Simple Staple / Suture removal (25 or less) [] - 0 Complex Staple / Suture removal (26 or more) [] - 0 Hypo/Hyperglycemic Management (do not check if billed separately) [] -  0 Ankle / Brachial Index (ABI) - do not check if billed separately Has the patient been seen at the hospital within the last  three years: Yes Total Score: 0 Level Of Care: ____ [name], [name] ([id number]) Electronic Signature(s) Signed: 08/21/2021 5:07:02 PM By: [name] Entered By: [name] on 08/20/2021 13:46:39 [name], [name] ([id number]) -------------------------------------------------------------------------------- Encounter Discharge Information Details Patient Name: [name], [name] Date of Service: 08/20/2021 12:30 PM Medical Record Number: [id number] Patient Account Number: [id number] Date of Birth/Sex: 04/05/1961 (60 y.o. M) Treating [name]: [name] Primary Care Provider: Cable, Derrick Other Clinician: Referring Provider: Cable, [name] Treating Provider/Extender: [name] in Treatment: 2 Encounter Discharge Information Items Post Procedure Vitals Discharge Condition: Stable Temperature (F): 98.2 Ambulatory Status: Ambulatory Pulse (bpm): 69 Discharge Destination: Home Respiratory Rate (breaths/min): 18 Transportation: Private Auto Blood Pressure (mmHg): 138/76 Accompanied By: daughter Schedule Follow-up Appointment: Yes Clinical Summary of Care: Patient Declined Electronic Signature(s) Signed: 08/21/2021 5:07:02 PM By: [name] Entered By: [name] on 08/20/2021 13:48:05 [name], [name] ([id number]) -------------------------------------------------------------------------------- Lower Extremity Assessment Details Patient Name: [name], [name] Date of Service: 08/20/2021 12:30 PM Medical Record Number: [id number] Patient Account Number: [id number] Date of Birth/Sex: 10/24/1960 (60 y.o. M) Treating [name]: [name] Primary Care Provider: Cable, [name] Other Clinician: Referring Provider: Cable, [name] Treating Provider/Extender: [name] in Treatment: 2 Edema Assessment Assessed: [Left: No] [Right: No] [Left: Edema]  [Right: :] Calf Left: Right: Point of Measurement: 32 cm From Medial Instep 36 cm 34 cm Ankle Left: Right: Point of Measurement: 10 cm From Medial Instep 22 cm 22 cm Knee To Floor Left: Right: From Medial Instep 41 cm 41 cm Vascular Assessment Pulses: Dorsalis Pedis Palpable: [Left:Yes] [Right:Yes] Electronic Signature(s) Signed: 08/21/2021 5:07:02 PM By: [name] Entered By: [name] on 08/20/2021 13:06:21 [name], [name] ([id number]) -------------------------------------------------------------------------------- Multi Wound Chart Details Patient Name: [name], [name] Date of Service: 08/20/2021 12:30 PM Medical Record Number: [id number] Patient Account Number: [id number] Date of Birth/Sex: 06/13/1961 (60 y.o. M) Treating [name]: [name] Primary Care Provider: Cable, Konor Other Clinician: Referring Provider: Cable, Hewitt Treating Provider/Extender: [name] in Treatment: 2 Vital Signs Height(in): 65 Pulse(bpm): 69 Weight(lbs): 180 Blood Pressure(mmHg): 138/76 Body Mass Index(BMI): 30 Temperature(°F): 98.2 Respiratory Rate(breaths/min): 18 Photos: Wound Location: Right, Posterior Lower Leg Right, Medial Lower Leg Right, Distal Lower Leg Wounding Event: Gradually Appeared Gradually Appeared Gradually Appeared Primary Etiology: Diabetic Wound/Ulcer of the Lower Diabetic Wound/Ulcer of the Lower Diabetic Wound/Ulcer of the Lower Extremity Extremity Extremity Comorbid History: Coronary Artery Disease, Coronary Artery Disease, Coronary Artery Disease, Hypertension, Myocardial Infarction, Hypertension, Myocardial Infarction, Hypertension, Myocardial Infarction, Type II Diabetes Type II Diabetes Type II Diabetes Date Acquired: 07/19/2021 07/19/2021 07/19/2021 [name] of Treatment: 2 2 2 Wound Status: Open Open Open Measurements L x W x D (cm) 3x3.2x0.1 2x1x0.1 0.5x0.6x0.1 Area (cm) : 7.54 1.571 0.236 Volume (cm) : 0.754 0.157 0.024 % Reduction in  Area: 31.40% 48.70% 71.40% % Reduction in Volume: 31.50% 48.70% 85.50% Classification: Grade 2 Grade 2 Grade 2 Exudate Amount: None Present None Present None Present Granulation Amount: None Present (0%) None Present (0%) None Present (0%) Necrotic Amount: Large (67-100%) Large (67-100%) Large (67-100%) Necrotic Tissue: Eschar Eschar Eschar Exposed Structures: Fat Layer (Subcutaneous Tissue): Fascia: No Fat Layer (Subcutaneous Tissue): Yes Fat Layer (Subcutaneous Tissue): Yes Fascia: No No Fascia: No Tendon: No Tendon: No Tendon: No Muscle: No Muscle: No Muscle: No Joint: No Joint: No Joint: No Bone: No Bone: No Bone: No Epithelialization: None None None Debridement: Debridement - Excisional Debridement - Excisional N/A Pre-procedure Verification/Time 13:30 13:30 N/A Out Taken: Tissue Debrided: Subcutaneous,   Slough Subcutaneous, [employer] N/A Level: Skin/Subcutaneous Tissue Skin/Subcutaneous Tissue N/A Debridement Area (sq cm): 9.6 2 N/A Instrument: Curette Curette N/A Bleeding: Minimum Minimum N/A Hemostasis Achieved: Pressure Pressure N/A Procedural Pain: 0 0 N/A Post Procedural Pain: 0 0 N/A Debridement Treatment Procedure was tolerated well Procedure was tolerated well N/A Response: Post Debridement 3x3.2x0.1 2x1x0.1 N/A Measurements L x W x D (cm) [name], [name] ([id number]) Post Debridement Volume: 0.754 0.157 N/A (cm) Procedures Performed: Debridement Debridement N/A Wound Number: 4 5 N/A Photos: N/A Wound Location: Right, Lateral Lower Leg Left, Medial Lower Leg N/A Wounding Event: Gradually Appeared Gradually Appeared N/A Primary Etiology: Diabetic Wound/Ulcer of the Lower Diabetic Wound/Ulcer of the Lower N/A Extremity Extremity Comorbid History: Coronary Artery Disease, Coronary Artery Disease, N/A Hypertension, Myocardial Infarction, Hypertension, Myocardial Infarction, Type II Diabetes Type II Diabetes Date Acquired: 07/19/2021 07/19/2021 N/A [name] of  Treatment: 2 2 N/A Wound Status: Healed - Epithelialized Open N/A Measurements L x W x D (cm) 0x0x0 0.7x0.7x0.1 N/A Area (cm) : 0 0.385 N/A Volume (cm) : 0 0.038 N/A % Reduction in Area: 100.00% 51.00% N/A % Reduction in Volume: 100.00% 51.90% N/A Classification: Grade 2 Grade 2 N/A Exudate Amount: None Present None Present N/A Granulation Amount: None Present (0%) None Present (0%) N/A Necrotic Amount: None Present (0%) Large (67-100%) N/A Necrotic Tissue: N/A Eschar N/A Exposed Structures: Fascia: No Fascia: No N/A Fat Layer (Subcutaneous Tissue): Fat Layer (Subcutaneous Tissue): No No Tendon: No Tendon: No Muscle: No Muscle: No Joint: No Joint: No Bone: No Bone: No Epithelialization: Large (67-100%) None N/A Debridement: N/A Debridement - Excisional N/A Pre-procedure Verification/Time N/A 13:30 N/A Out Taken: Tissue Debrided: N/A Subcutaneous, Slough N/A Level: N/A Skin/Subcutaneous Tissue N/A Debridement Area (sq cm): N/A 0.49 N/A Instrument: N/A Curette N/A Bleeding: N/A Minimum N/A Hemostasis Achieved: N/A Pressure N/A Procedural Pain: N/A 0 N/A Post Procedural Pain: N/A 0 N/A Debridement Treatment N/A Procedure was tolerated well N/A Response: Post Debridement N/A 0.7x0.7x0.1 N/A Measurements L x W x D (cm) Post Debridement Volume: N/A 0.038 N/A (cm) Procedures Performed: N/A Debridement N/A Treatment Notes Electronic Signature(s) Signed: 08/20/2021 1:46:27 PM By: Kalman Shan DO Entered By: Kalman Shan on 08/20/2021 13:41:13 [name] ([id number]) -------------------------------------------------------------------------------- Multi-Disciplinary Care Plan Details Patient Name: [name], [name]. Date of Service: 08/20/2021 12:30 PM Medical Record Number: [id number] Patient Account Number: [id number] Date of Birth/Sex: 05-23-1961 (60 y.o. M) Treating [name]: [name] Primary Care Elaisha Zahniser: [name] Other Clinician: Referring Rameen Gohlke:  [name] Treating Dezzie Badilla/Extender: [name] in Treatment: 2 Active Inactive Wound/Skin Impairment Nursing Diagnoses: Knowledge deficit related to ulceration/compromised skin integrity Goals: Patient/caregiver will verbalize understanding of skin care regimen Date Initiated: 08/06/2021 Date Inactivated: 08/13/2021 Target Resolution Date: 09/06/2021 Goal Status: Met Ulcer/skin breakdown will have a volume reduction of 30% by week 4 Date Initiated: 08/06/2021 Target Resolution Date: 10/06/2021 Goal Status: Active Ulcer/skin breakdown will have a volume reduction of 50% by week 8 Date Initiated: 08/06/2021 Target Resolution Date: 11/06/2021 Goal Status: Active Ulcer/skin breakdown will have a volume reduction of 80% by week 12 Date Initiated: 08/06/2021 Target Resolution Date: 12/07/2021 Goal Status: Active Ulcer/skin breakdown will heal within 14 [name] Date Initiated: 08/06/2021 Target Resolution Date: 01/04/2022 Goal Status: Active Interventions: Assess patient/caregiver ability to obtain necessary supplies Assess patient/caregiver ability to perform ulcer/skin care regimen upon admission and as needed Assess ulceration(s) every visit Notes: Electronic Signature(s) Signed: 08/21/2021 5:07:02 PM By: Derrick Coria [name] Entered By: [name] on 08/20/2021 13:33:43 [name]. (  [id number]) -------------------------------------------------------------------------------- Pain Assessment Details Patient Name: [name], [name] Date of Service: 08/20/2021 12:30 PM Medical Record Number: [id number] Patient Account Number: [id number] Date of Birth/Sex: 04/14/1961 (60 y.o. M) Treating [name]: [name] Primary Care Provider: Cable, Dinari Other Clinician: Referring Provider: Cable, Lorraine Treating Provider/Extender: [name] in Treatment: 2 Active Problems Location of Pain Severity and Description of Pain Patient Has Paino No Site Locations Pain  Management and Medication Current Pain Management: Electronic Signature(s) Signed: 08/21/2021 5:07:02 PM By: [name] Entered By: [name] on 08/20/2021 12:45:22 [name], Rawlin F. ([id number]) -------------------------------------------------------------------------------- Patient/Caregiver Education Details Patient Name: Gaiser, Vivek F. Date of Service: 08/20/2021 12:30 PM Medical Record Number: [id number] Patient Account Number: [id number] Date of Birth/Gender: 11/28/1960 (60 y.o. M) Treating [name]: [name] Primary Care Physician: [name], Rithvik Other Clinician: Referring Physician: Cable, Shakil Treating Physician/Extender: [name] in Treatment: 2 Education Assessment Education Provided To: Patient Education Topics Provided Wound/Skin Impairment: Methods: Explain/Verbal Responses: State content correctly Electronic Signature(s) Signed: 08/21/2021 5:07:02 PM By: [name] Entered By: [name] on 08/20/2021 13:47:06 Shave, Ernest F. ([id number]) -------------------------------------------------------------------------------- Wound Assessment Details Patient Name: Rohlman, Amare F. Date of Service: 08/20/2021 12:30 PM Medical Record Number: [id number] Patient Account Number: [id number] Date of Birth/Sex: 11/17/1960 (60 y.o. M) Treating [name]: [name] Primary Care Provider: Cable, Jin Other Clinician: Referring Provider: Cable, Damont Treating Provider/Extender: [name] in Treatment: 2 Wound Status Wound Number: 1 Primary Diabetic Wound/Ulcer of the Lower Extremity Etiology: Wound Location: Right, Posterior Lower Leg Wound Status: Open Wounding Event: Gradually Appeared Comorbid Coronary Artery Disease, Hypertension, Myocardial Date Acquired: 07/19/2021 History: Infarction, Type II Diabetes [name] Of Treatment: 2 Clustered Wound: No Photos Wound Measurements Length: (cm) 3 Width: (cm) 3.2 Depth: (cm) 0.1 Area: (cm)  7.54 Volume: (cm) 0.754 % Reduction in Area: 31.4% % Reduction in Volume: 31.5% Epithelialization: None Tunneling: No Undermining: No Wound Description Classification: Grade 2 Exudate Amount: None Present Foul Odor After Cleansing: No Slough/Fibrino Yes Wound Bed Granulation Amount: None Present (0%) Exposed Structure Necrotic Amount: Large (67-100%) Fascia Exposed: No Necrotic Quality: Eschar Fat Layer (Subcutaneous Tissue) Exposed: Yes Tendon Exposed: No Muscle Exposed: No Joint Exposed: No Bone Exposed: No Treatment Notes Wound #1 (Lower Leg) Wound Laterality: Right, Posterior Cleanser Byram Ancillary Kit - 15 Day Supply Discharge Instruction: Use supplies as instructed; Kit contains: (15) Saline Bullets; (15) 3x3 Gauze; 15 pr Gloves Soap and Water Discharge Instruction: Gently cleanse wound with antibacterial soap, rinse and pat dry prior to dressing wounds Algeo, Joie F. ([id number]) Peri-Wound Care Topical Primary Dressing Gauze Discharge Instruction: As directed: dry, moistened with saline or moistened with Dakins Solution Santyl Collagenase Ointment, 30 (gm), tube Secondary Dressing Zetuvit Plus Silicone Border Dressing 4x4 (in/in) Secured With Compression Wrap Compression Stockings Add-Ons Electronic Signature(s) Signed: 08/21/2021 5:07:02 PM By: [name] Entered By: [name] on 08/20/2021 13:00:28 Bulger, Quinntin F. ([id number]) -------------------------------------------------------------------------------- Wound Assessment Details Patient Name: Blum, Laderrick F. Date of Service: 08/20/2021 12:30 PM Medical Record Number: [id number] Patient Account Number: [id number] Date of Birth/Sex: 08/31/1961 (60 y.o. M) Treating [name]: [name] Primary Care Provider: Cable, Calbert Other Clinician: Referring Provider: Cable, Rosie Treating Provider/Extender: [name] in Treatment: 2 Wound Status Wound Number: 2 Primary Diabetic Wound/Ulcer of  the Lower Extremity Etiology: Wound Location: Right, Medial Lower Leg Wound Status: Open Wounding Event: Gradually Appeared Comorbid Coronary Artery Disease, Hypertension, Myocardial Date Acquired: 07/19/2021 History: Infarction, Type II Diabetes [name] Of Treatment: 2 Clustered Wound: No Photos   Wound Measurements Length: (cm) 2 Width: (cm) 1 Depth: (cm) 0.1 Area: (cm) 1.571 Volume: (cm) 0.157 % Reduction in Area: 48.7% % Reduction in Volume: 48.7% Epithelialization: None Tunneling: No Undermining: No Wound Description Classification: Grade 2 Exudate Amount: None Present Foul Odor After Cleansing: No Slough/Fibrino Yes Wound Bed Granulation Amount: None Present (0%) Exposed Structure Necrotic Amount: Large (67-100%) Fascia Exposed: No Necrotic Quality: Eschar Fat Layer (Subcutaneous Tissue) Exposed: No Tendon Exposed: No Muscle Exposed: No Joint Exposed: No Bone Exposed: No Treatment Notes Wound #2 (Lower Leg) Wound Laterality: Right, Medial Cleanser Byram Ancillary Kit - 15 Day Supply Discharge Instruction: Use supplies as instructed; Kit contains: (15) Saline Bullets; (15) 3x3 Gauze; 15 pr Gloves Soap and Water Discharge Instruction: Gently cleanse wound with antibacterial soap, rinse and pat dry prior to dressing wounds Korte, Karanveer F. ([id number]) Peri-Wound Care Topical Primary Dressing Gauze Discharge Instruction: As directed: dry, moistened with saline or moistened with Dakins Solution Santyl Collagenase Ointment, 30 (gm), tube Secondary Dressing Kerlix 4.5 x 4.1 (in/yd) Discharge Instruction: Apply Kerlix 4.5 x 4.1 (in/yd) as instructed Zetuvit Plus Silicone Border Dressing 4x4 (in/in) Secured With Compression Wrap Compression Stockings Add-Ons Electronic Signature(s) Signed: 08/21/2021 5:07:02 PM By: [name] Entered By: [name] on 08/20/2021 13:02:24 Rappa, Cayce F.  ([id number]) -------------------------------------------------------------------------------- Wound Assessment Details Patient Name: Mullan, Cameryn F. Date of Service: 08/20/2021 12:30 PM Medical Record Number: [id number] Patient Account Number: [id number] Date of Birth/Sex: 06/09/1961 (60 y.o. M) Treating [name]: [name] Primary Care Fallyn Munnerlyn: [name], Bud Other Clinician: Referring Cydni Reddoch: [name], Teo Treating Deiondra Denley/Extender: [name] in Treatment: 2 Wound Status Wound Number: 3 Primary Diabetic Wound/Ulcer of the Lower Extremity Etiology: Wound Location: Right, Distal Lower Leg Wound Status: Open Wounding Event: Gradually Appeared Comorbid Coronary Artery Disease, Hypertension, Myocardial Date Acquired: 07/19/2021 History: Infarction, Type II Diabetes [name] Of Treatment: 2 Clustered Wound: No Photos Wound Measurements Length: (cm) 0.5 Width: (cm) 0.6 Depth: (cm) 0.1 Area: (cm) 0.236 Volume: (cm) 0.024 % Reduction in Area: 71.4% % Reduction in Volume: 85.5% Epithelialization: None Tunneling: No Undermining: No Wound Description Classification: Grade 2 Exudate Amount: None Present Foul Odor After Cleansing: No Slough/Fibrino Yes Wound Bed Granulation Amount: None Present (0%) Exposed Structure Necrotic Amount: Large (67-100%) Fascia Exposed: No Necrotic Quality: Eschar Fat Layer (Subcutaneous Tissue) Exposed: Yes Tendon Exposed: No Muscle Exposed: No Joint Exposed: No Bone Exposed: No Treatment Notes Wound #3 (Lower Leg) Wound Laterality: Right, Distal Cleanser Byram Ancillary Kit - 15 Day Supply Discharge Instruction: Use supplies as instructed; Kit contains: (15) Saline Bullets; (15) 3x3 Gauze; 15 pr Gloves Soap and Water Discharge Instruction: Gently cleanse wound with antibacterial soap, rinse and pat dry prior to dressing wounds Dicker, Jiyaan F. ([id number]) Peri-Wound Care Topical Primary Dressing Gauze Discharge Instruction: As  directed: dry, moistened with saline or moistened with Dakins Solution Santyl Collagenase Ointment, 30 (gm), tube Secondary Dressing Zetuvit Plus Silicone Border Dressing 4x4 (in/in) Secured With Compression Wrap Compression Stockings Add-Ons Electronic Signature(s) Signed: 08/21/2021 5:07:02 PM By: [name] Entered By: [name] on 08/20/2021 13:03:15 Anglada, Ishmael F. ([id number]) -------------------------------------------------------------------------------- Wound Assessment Details Patient Name: Cutbirth, Deonta F. Date of Service: 08/20/2021 12:30 PM Medical Record Number: [id number] Patient Account Number: [id number] Date of Birth/Sex: 04/20/1961 (60 y.o. M) Treating [name]: [name] Primary Care Crescent [place]: [name], Carlo Other Clinician: Referring Shaylynn Nulty: [name], Matheus Treating Cathie Bonnell/Extender: [name] in Treatment: 2 Wound Status Wound Number: 4 Primary Diabetic Wound/Ulcer of the Lower Extremity Etiology: Wound Location: Right, Lateral   Lower Leg Wound Status: Healed - Epithelialized Wounding Event: Gradually Appeared Comorbid Coronary Artery Disease, Hypertension, Myocardial Date Acquired: 07/19/2021 History: Infarction, Type II Diabetes [name] Of Treatment: 2 Clustered Wound: No Photos Wound Measurements Length: (cm) 0 % Re Width: (cm) 0 % Re Depth: (cm) 0 Epit Area: (cm) 0 Tun Volume: (cm) 0 Und duction in Area: 100% duction in Volume: 100% helialization: Large (67-100%) neling: No ermining: No Wound Description Classification: Grade 2 Fou Exudate Amount: None Present Slo l Odor After Cleansing: No ugh/Fibrino No Wound Bed Granulation Amount: None Present (0%) Exposed Structure Necrotic Amount: None Present (0%) Fascia Exposed: No Fat Layer (Subcutaneous Tissue) Exposed: No Tendon Exposed: No Muscle Exposed: No Joint Exposed: No Bone Exposed: No Treatment Notes Wound #4 (Lower Leg) Wound Laterality: Right,  Lateral Cleanser Peri-Wound Care Topical Primary Dressing Cancel, Jammy F. ([id number]) Secondary Dressing Secured With Compression Wrap Compression Stockings Add-Ons Electronic Signature(s) Signed: 08/21/2021 5:07:02 PM By: [name] Entered By: [name] on 08/20/2021 13:04:21 Broda, Leul F. ([id number]) -------------------------------------------------------------------------------- Wound Assessment Details Patient Name: Siddiqi, Elder F. Date of Service: 08/20/2021 12:30 PM Medical Record Number: [id number] Patient Account Number: [id number] Date of Birth/Sex: 05/13/1961 (60 y.o. M) Treating [name]: [name] Primary Care Provider: Cable, Raevon Other Clinician: Referring Provider: Cable, Richards Treating Provider/Extender: [name] in Treatment: 2 Wound Status Wound Number: 5 Primary Diabetic Wound/Ulcer of the Lower Extremity Etiology: Wound Location: Left, Medial Lower Leg Wound Status: Open Wounding Event: Gradually Appeared Comorbid Coronary Artery Disease, Hypertension, Myocardial Date Acquired: 07/19/2021 History: Infarction, Type II Diabetes [name] Of Treatment: 2 Clustered Wound: No Photos Wound Measurements Length: (cm) 0.7 Width: (cm) 0.7 Depth: (cm) 0.1 Area: (cm) 0.385 Volume: (cm) 0.038 % Reduction in Area: 51% % Reduction in Volume: 51.9% Epithelialization: None Tunneling: No Undermining: No Wound Description Classification: Grade 2 Exudate Amount: None Present Foul Odor After Cleansing: No Slough/Fibrino Yes Wound Bed Granulation Amount: None Present (0%) Exposed Structure Necrotic Amount: Large (67-100%) Fascia Exposed: No Necrotic Quality: Eschar Fat Layer (Subcutaneous Tissue) Exposed: No Tendon Exposed: No Muscle Exposed: No Joint Exposed: No Bone Exposed: No Treatment Notes Wound #5 (Lower Leg) Wound Laterality: Left, Medial Cleanser Byram Ancillary Kit - 15 Day Supply Discharge Instruction: Use supplies as  instructed; Kit contains: (15) Saline Bullets; (15) 3x3 Gauze; 15 pr Gloves Soap and Water Discharge Instruction: Gently cleanse wound with antibacterial soap, rinse and pat dry prior to dressing wounds Ratchford, Cederic F. ([id number]) Peri-Wound Care Topical Primary Dressing Gauze Discharge Instruction: As directed: dry, moistened with saline or moistened with Dakins Solution Santyl Collagenase Ointment, 30 (gm), tube Secondary Dressing Zetuvit Plus Silicone Border Dressing 4x4 (in/in) Secured With Compression Wrap Compression Stockings Add-Ons Electronic Signature(s) Signed: 08/21/2021 5:07:02 PM By: [name] Entered By: [name] on 08/20/2021 13:04:56 Gosnell, Chau F. ([id number]) -------------------------------------------------------------------------------- Vitals Details Patient Name: Panozzo, Micheal F. Date of Service: 08/20/2021 12:30 PM Medical Record Number: [id number] Patient Account Number: [id number] Date of Birth/Sex: 07/09/1961 (60 y.o. M) Treating [name]: [name] Primary Care Provider: Cable, Judah Other Clinician: Referring Provider: Cable, Stanislaw Treating Provider/Extender: [name] in Treatment: 2 Vital Signs Time Taken: 12:34 Temperature (°F): 98.2 Height (in): 65 Pulse (bpm): 69 Weight (lbs): 180 Respiratory Rate (breaths/min): 18 Body Mass Index (BMI): 30 Blood Pressure (mmHg): 138/76 Reference Range: 80 - 120 mg / dl Electronic Signature(s) Signed: 08/21/2021 5:07:02 PM By: [name] Entered By: [name] on 08/20/2021 12:45:14 

## 2021-08-22 NOTE — Progress Notes (Signed)
[name] ([id number]) Visit Report for 08/20/2021 Chief Complaint Document Details Patient Name: [name], [place]. Date of Service: 08/20/2021 12:30 PM Medical Record Number: [id number] Patient Account Number: [id number] Date of Birth/Sex: October 21, 1960 (60 y.o. M) Treating RN: Carlene Coria Primary Care Provider: Karl Ito Other Clinician: Referring Provider: Karl Ito Treating Provider/Extender: Yaakov Guthrie in Treatment: 2 Information Obtained from: Patient Chief Complaint Bilateral lower extremity wounds Electronic Signature(s) Signed: 08/20/2021 1:46:27 PM By: Kalman Shan DO Entered By: Kalman Shan on 08/20/2021 13:41:28 [name] ([id number]) -------------------------------------------------------------------------------- Debridement Details Patient Name: [name]. Date of Service: 08/20/2021 12:30 PM Medical Record Number: [id number] Patient Account Number: [id number] Date of Birth/Sex: September 07, 1961 (60 y.o. M) Treating RN: Carlene Coria Primary Care Provider: Karl Ito Other Clinician: Referring Provider: Karl Ito Treating Provider/Extender: Yaakov Guthrie in Treatment: 2 Debridement Performed for Wound #2 Right,Medial Lower Leg Assessment: Performed By: Physician Kalman Shan, MD Debridement Type: Debridement Severity of Tissue Pre Debridement: Fat layer exposed Level of Consciousness (Pre- Awake and Alert procedure): Pre-procedure Verification/Time Out Yes - 13:30 Taken: Start Time: 13:30 Total Area Debrided (L x W): 2 (cm) x 1 (cm) = 2 (cm) Tissue and other material Viable, Non-Viable, Slough, Subcutaneous, Skin: Dermis , Skin: Epidermis, Slough debrided: Level: Skin/Subcutaneous Tissue Debridement Description: Excisional Instrument: Curette Bleeding: Minimum Hemostasis Achieved: Pressure End Time: 13:41 Procedural Pain: 0 Post Procedural Pain: 0 Response to Treatment: Procedure was tolerated  well Level of Consciousness (Post- Awake and Alert procedure): Post Debridement Measurements of Total Wound Length: (cm) 2 Width: (cm) 1 Depth: (cm) 0.1 Volume: (cm) 0.157 Character of Wound/Ulcer Post Debridement: Improved Severity of Tissue Post Debridement: Fat layer exposed Post Procedure Diagnosis Same as Pre-procedure Electronic Signature(s) Signed: 08/20/2021 1:46:27 PM By: Kalman Shan DO Signed: 08/21/2021 5:07:02 PM By: Carlene Coria RN Entered By: Carlene Coria on 08/20/2021 13:35:56 [name] ([id number]) -------------------------------------------------------------------------------- Debridement Details Patient Name: [name]. Date of Service: 08/20/2021 12:30 PM Medical Record Number: [id number] Patient Account Number: [id number] Date of Birth/Sex: 12-19-1960 (60 y.o. M) Treating RN: Carlene Coria Primary Care Provider: Karl Ito Other Clinician: Referring Provider: Karl Ito Treating Provider/Extender: Yaakov Guthrie in Treatment: 2 Debridement Performed for Wound #1 Right,Posterior Lower Leg Assessment: Performed By: Physician Kalman Shan, MD Debridement Type: Debridement Severity of Tissue Pre Debridement: Fat layer exposed Level of Consciousness (Pre- Awake and Alert procedure): Pre-procedure Verification/Time Out Yes - 13:30 Taken: Start Time: 13:30 Total Area Debrided (L x W): 3 (cm) x 3.2 (cm) = 9.6 (cm) Tissue and other material Viable, Non-Viable, Slough, Subcutaneous, Skin: Dermis , Skin: Epidermis, Slough debrided: Level: Skin/Subcutaneous Tissue Debridement Description: Excisional Instrument: Curette Bleeding: Minimum Hemostasis Achieved: Pressure End Time: 13:41 Procedural Pain: 0 Post Procedural Pain: 0 Response to Treatment: Procedure was tolerated well Level of Consciousness (Post- Awake and Alert procedure): Post Debridement Measurements of Total Wound Length: (cm) 3 Width: (cm) 3.2 Depth: (cm)  0.1 Volume: (cm) 0.754 Character of Wound/Ulcer Post Debridement: Improved Severity of Tissue Post Debridement: Fat layer exposed Post Procedure Diagnosis Same as Pre-procedure Electronic Signature(s) Signed: 08/20/2021 1:46:27 PM By: Kalman Shan DO Signed: 08/21/2021 5:07:02 PM By: Carlene Coria RN Entered By: Carlene Coria on 08/20/2021 13:36:36 [name] ([id number]) -------------------------------------------------------------------------------- Debridement Details Patient Name: [name]. Date of Service: 08/20/2021 12:30 PM Medical Record Number: [id number] Patient Account Number: [id number] Date of Birth/Sex: [birth year] (60 y.o. M) Treating RN: Carlene Coria Primary Care Provider: Karl Ito Other Clinician: Referring Provider: Karl Ito Treating Provider/Extender:  Kalman Shan Weeks in Treatment: 2 Debridement Performed for Wound #5 Left,Medial Lower Leg Assessment: Performed By: Physician Kalman Shan, MD Debridement Type: Debridement Severity of Tissue Pre Debridement: Fat layer exposed Level of Consciousness (Pre- Awake and Alert procedure): Pre-procedure Verification/Time Out Yes - 13:30 Taken: Start Time: 13:30 Total Area Debrided (L x W): 0.7 (cm) x 0.7 (cm) = 0.49 (cm) Tissue and other material Viable, Non-Viable, Slough, Subcutaneous, Skin: Dermis , Skin: Epidermis, Slough debrided: Level: Skin/Subcutaneous Tissue Debridement Description: Excisional Instrument: Curette Bleeding: Minimum Hemostasis Achieved: Pressure End Time: 13:41 Procedural Pain: 0 Post Procedural Pain: 0 Response to Treatment: Procedure was tolerated well Level of Consciousness (Post- Awake and Alert procedure): Post Debridement Measurements of Total Wound Length: (cm) 0.7 Width: (cm) 0.7 Depth: (cm) 0.1 Volume: (cm) 0.038 Character of Wound/Ulcer Post Debridement: Improved Severity of Tissue Post Debridement: Fat layer exposed Post Procedure  Diagnosis Same as Pre-procedure Electronic Signature(s) Signed: 08/20/2021 1:46:27 PM By: Kalman Shan DO Signed: 08/21/2021 5:07:02 PM By: Carlene Coria RN Entered By: Carlene Coria on 08/20/2021 13:38:26 [name] ([id number]) -------------------------------------------------------------------------------- HPI Details Patient Name: [name]. Date of Service: 08/20/2021 12:30 PM Medical Record Number: [id number] Patient Account Number: [id number] Date of Birth/Sex: 28-Jan-1961 (60 y.o. M) Treating RN: Carlene Coria Primary Care Provider: Karl Ito Other Clinician: Referring Provider: Karl Ito Treating Provider/Extender: Yaakov Guthrie in Treatment: 2 History of Present Illness HPI Description: Admission 08/06/2021 Mr. Aloysious Vangieson is a 60 year old male with a past medical history of uncontrolled insulin-dependent type 2 diabetes, current cigarette smoker, CABG, chronic systolic congestive heart failure and history of osteomyelitis of the left foot status post fifth digit amputation that presents to the clinic for a 2-week history of nonhealing wounds to his lower extremities bilaterally. He states that 1 evening he developed blisters on his legs that eventually popped and developed tightly adhered nonviable tissue. He was evaluated by his primary care office and they noted increased erythema to the periwound's and he was started on doxycycline. He is currently taking this and reports improvement in his symptoms. He denies pain. He denies drainage. 10/26; patient presents for follow-up. He was able to start [facility] and has been using this for the past week. He no longer experiences pain or increased warmth or redness to the periwound's. He has no issues or complaints today. 11/2; patient presents for follow-up. He has been using Santyl daily. He has no issues or complaints today. He denies signs of infection. Electronic Signature(s) Signed: 08/20/2021 1:46:27 PM By:  Kalman Shan DO Entered By: Kalman Shan on 08/20/2021 13:41:59 [name] ([id number]) -------------------------------------------------------------------------------- Physical Exam Details Patient Name: DONAVAN, KERLIN. Date of Service: 08/20/2021 12:30 PM Medical Record Number: [id number] Patient Account Number: [id number] Date of Birth/Sex: June 15, 1961 (60 y.o. M) Treating RN: Carlene Coria Primary Care Provider: Karl Ito Other Clinician: Referring Provider: Karl Ito Treating Provider/Extender: Yaakov Guthrie in Treatment: 2 Constitutional . Psychiatric . Notes Bilateral lower extremity wounds with nonviable tissue. Post debridement there is some granulation tissue present. No signs of infection. Electronic Signature(s) Signed: 08/20/2021 1:46:27 PM By: Kalman Shan DO Entered By: Kalman Shan on 08/20/2021 13:42:48 [name] ([id number]) -------------------------------------------------------------------------------- Physician Orders Details Patient Name: JERREL, TIBERIO. Date of Service: 08/20/2021 12:30 PM Medical Record Number: [id number] Patient Account Number: [id number] Date of Birth/Sex: 16-Oct-1961 (60 y.o. M) Treating RN: Carlene Coria Primary Care Provider: Karl Ito Other Clinician: Referring Provider: Karl Ito Treating Provider/Extender: Yaakov Guthrie in Treatment: 2 Verbal / Phone  Orders: No Diagnosis Coding Follow-up Appointments o Return Appointment in 1 week. Bathing/ Shower/ Hygiene o May shower; gently cleanse wound with antibacterial soap, rinse and pat dry prior to dressing wounds Edema Control - Lymphedema / Segmental Compressive Device / Other o Elevate, Exercise Daily and Avoid Standing for Long Periods of Time. o Elevate legs to the level of the heart and pump ankles as often as possible o Elevate leg(s) parallel to the floor when sitting. Wound Treatment Wound #1 - Lower Leg Wound  Laterality: Right, Posterior Cleanser: Byram Ancillary Kit - 15 Day Supply (Generic) 1 x Per Day/30 Days Discharge Instructions: Use supplies as instructed; Kit contains: (15) Saline Bullets; (15) 3x3 Gauze; 15 pr Gloves Cleanser: Soap and Water 1 x Per Day/30 Days Discharge Instructions: Gently cleanse wound with antibacterial soap, rinse and pat dry prior to dressing wounds Primary Dressing: Gauze (Generic) 1 x Per Day/30 Days Discharge Instructions: As directed: dry, moistened with saline or moistened with Dakins Solution Primary Dressing: Santyl Collagenase Ointment, 30 (gm), tube 1 x Per Day/30 Days Secondary Dressing: Zetuvit Plus Silicone Border Dressing 4x4 (in/in) (DME) (Generic) 1 x Per Day/30 Days Wound #2 - Lower Leg Wound Laterality: Right, Medial Cleanser: Byram Ancillary Kit - 15 Day Supply (Generic) 1 x Per Day/30 Days Discharge Instructions: Use supplies as instructed; Kit contains: (15) Saline Bullets; (15) 3x3 Gauze; 15 pr Gloves Cleanser: Soap and Water 1 x Per Day/30 Days Discharge Instructions: Gently cleanse wound with antibacterial soap, rinse and pat dry prior to dressing wounds Primary Dressing: Gauze (Generic) 1 x Per Day/30 Days Discharge Instructions: As directed: dry, moistened with saline or moistened with Dakins Solution Primary Dressing: Santyl Collagenase Ointment, 30 (gm), tube 1 x Per Day/30 Days Secondary Dressing: Kerlix 4.5 x 4.1 (in/yd) 1 x Per Day/30 Days Discharge Instructions: Apply Kerlix 4.5 x 4.1 (in/yd) as instructed Secondary Dressing: Zetuvit Plus Silicone Border Dressing 4x4 (in/in) (DME) (Generic) 1 x Per Day/30 Days Wound #3 - Lower Leg Wound Laterality: Right, Distal Cleanser: Byram Ancillary Kit - 15 Day Supply (Generic) 1 x Per Day/30 Days Discharge Instructions: Use supplies as instructed; Kit contains: (15) Saline Bullets; (15) 3x3 Gauze; 15 pr Gloves Cleanser: Soap and Water 1 x Per Day/30 Days Discharge Instructions: Gently cleanse  wound with antibacterial soap, rinse and pat dry prior to dressing wounds Primary Dressing: Gauze (Generic) 1 x Per Day/30 Days Discharge Instructions: As directed: dry, moistened with saline or moistened with Dakins Solution Primary Dressing: Santyl Collagenase Ointment, 30 (gm), tube 1 x Per Day/30 Days RENARDO, CHEATUM ([id number]) Secondary Dressing: Zetuvit Plus Silicone Border Dressing 4x4 (in/in) 1 x Per Day/30 Days Wound #5 - Lower Leg Wound Laterality: Left, Medial Cleanser: Byram Ancillary Kit - 15 Day Supply (Generic) 1 x Per Day/30 Days Discharge Instructions: Use supplies as instructed; Kit contains: (15) Saline Bullets; (15) 3x3 Gauze; 15 pr Gloves Cleanser: Soap and Water 1 x Per Day/30 Days Discharge Instructions: Gently cleanse wound with antibacterial soap, rinse and pat dry prior to dressing wounds Primary Dressing: Gauze (Generic) 1 x Per Day/30 Days Discharge Instructions: As directed: dry, moistened with saline or moistened with Dakins Solution Primary Dressing: Santyl Collagenase Ointment, 30 (gm), tube 1 x Per Day/30 Days Secondary Dressing: Zetuvit Plus Silicone Border Dressing 4x4 (in/in) (DME) (Generic) 1 x Per Day/30 Days Electronic Signature(s) Signed: 08/20/2021 1:46:27 PM By: Kalman Shan DO Signed: 08/21/2021 5:07:02 PM By: Carlene Coria RN Entered By: Carlene Coria on 08/20/2021 13:40:56 [name] ([id number]) --------------------------------------------------------------------------------  Problem List Details Patient Name: GRACIANO, BATSON. Date of Service: 08/20/2021 12:30 PM Medical Record Number: [id number] Patient Account Number: [id number] Date of Birth/Sex: 04/01/1961 (60 y.o. M) Treating RN: Carlene Coria Primary Care Provider: Karl Ito Other Clinician: Referring Provider: Karl Ito Treating Provider/Extender: Yaakov Guthrie in Treatment: 2 Active Problems ICD-10 Encounter Code Description Active Date MDM Diagnosis L97.919  Non-pressure chronic ulcer of unspecified part of right lower leg with 08/06/2021 No Yes unspecified severity L97.829 Non-pressure chronic ulcer of other part of left lower leg with 08/06/2021 No Yes unspecified severity E11.9 Type 2 diabetes mellitus without complications 95/63/8756 No Yes I10 Essential (primary) hypertension 08/06/2021 No Yes E33.29 Chronic systolic (congestive) heart failure 08/06/2021 No Yes I25.119 Atherosclerotic heart disease of native coronary artery with unspecified 08/06/2021 No Yes angina pectoris Z95.1 Presence of aortocoronary bypass graft 08/06/2021 No Yes Inactive Problems Resolved Problems Electronic Signature(s) Signed: 08/20/2021 1:46:27 PM By: Kalman Shan DO Entered By: Kalman Shan on 08/20/2021 13:41:02 [name] ([id number]) -------------------------------------------------------------------------------- Progress Note Details Patient Name: [name]. Date of Service: 08/20/2021 12:30 PM Medical Record Number: [id number] Patient Account Number: [id number] Date of Birth/Sex: [birth year] (60 y.o. M) Treating RN: Carlene Coria Primary Care Provider: Karl Ito Other Clinician: Referring Provider: Karl Ito Treating Provider/Extender: Yaakov Guthrie in Treatment: 2 Subjective Chief Complaint Information obtained from Patient Bilateral lower extremity wounds History of Present Illness (HPI) Admission 08/06/2021 Mr. Harlis Champoux is a 60 year old male with a past medical history of uncontrolled insulin-dependent type 2 diabetes, current cigarette smoker, CABG, chronic systolic congestive heart failure and history of osteomyelitis of the left foot status post fifth digit amputation that presents to the clinic for a 2-week history of nonhealing wounds to his lower extremities bilaterally. He states that 1 evening he developed blisters on his legs that eventually popped and developed tightly adhered nonviable tissue. He was  evaluated by his primary care office and they noted increased erythema to the periwound's and he was started on doxycycline. He is currently taking this and reports improvement in his symptoms. He denies pain. He denies drainage. 10/26; patient presents for follow-up. He was able to start [facility] and has been using this for the past week. He no longer experiences pain or increased warmth or redness to the periwound's. He has no issues or complaints today. 11/2; patient presents for follow-up. He has been using Santyl daily. He has no issues or complaints today. He denies signs of infection. Patient History Information obtained from Patient. Social History Former smoker, Marital Status - Separated, Alcohol Use - Never, Drug Use - No History, Caffeine Use - Daily. Medical History Cardiovascular Patient has history of Coronary Artery Disease, Hypertension, Myocardial Infarction Endocrine Patient has history of Type II Diabetes Objective Constitutional Vitals Time Taken: 12:34 PM, Height: 65 in, Weight: 180 lbs, BMI: 30, Temperature: 98.2 F, Pulse: 69 bpm, Respiratory Rate: 18 breaths/min, Blood Pressure: 138/76 mmHg. General Notes: Bilateral lower extremity wounds with nonviable tissue. Post debridement there is some granulation tissue present. No signs of infection. Integumentary (Hair, Skin) Wound #1 status is Open. Original cause of wound was Gradually Appeared. The date acquired was: 07/19/2021. The wound has been in treatment 2 weeks. The wound is located on the Right,Posterior Lower Leg. The wound measures 3cm length x 3.2cm width x 0.1cm depth; 7.54cm^2 area and 0.754cm^3 volume. There is Fat Layer (Subcutaneous Tissue) exposed. There is no tunneling or undermining noted. There is a none present amount of drainage noted. There is no  granulation within the wound bed. There is a large (67-100%) amount of necrotic tissue within the wound bed including Eschar. Wound #2 status is Open.  Original cause of wound was Gradually Appeared. The date acquired was: 07/19/2021. The wound has been in treatment 2 weeks. The wound is located on the Right,Medial Lower Leg. The wound measures 2cm length x 1cm width x 0.1cm depth; 1.571cm^2 area and 0.157cm^3 volume. There is no tunneling or undermining noted. There is a none present amount of drainage noted. There is no granulation within the wound bed. There is a large (67-100%) amount of necrotic tissue within the wound bed including Eschar. GILDARDO, TICKNER ([id number]) Wound #3 status is Open. Original cause of wound was Gradually Appeared. The date acquired was: 07/19/2021. The wound has been in treatment 2 weeks. The wound is located on the Right,Distal Lower Leg. The wound measures 0.5cm length x 0.6cm width x 0.1cm depth; 0.236cm^2 area and 0.024cm^3 volume. There is Fat Layer (Subcutaneous Tissue) exposed. There is no tunneling or undermining noted. There is a none present amount of drainage noted. There is no granulation within the wound bed. There is a large (67-100%) amount of necrotic tissue within the wound bed including Eschar. Wound #4 status is Healed - Epithelialized. Original cause of wound was Gradually Appeared. The date acquired was: 07/19/2021. The wound has been in treatment 2 weeks. The wound is located on the Right,Lateral Lower Leg. The wound measures 0cm length x 0cm width x 0cm depth; 0cm^2 area and 0cm^3 volume. There is no tunneling or undermining noted. There is a none present amount of drainage noted. There is no granulation within the wound bed. There is no necrotic tissue within the wound bed. Wound #5 status is Open. Original cause of wound was Gradually Appeared. The date acquired was: 07/19/2021. The wound has been in treatment 2 weeks. The wound is located on the Left,Medial Lower Leg. The wound measures 0.7cm length x 0.7cm width x 0.1cm depth; 0.385cm^2 area and 0.038cm^3 volume. There is no tunneling or  undermining noted. There is a none present amount of drainage noted. There is no granulation within the wound bed. There is a large (67-100%) amount of necrotic tissue within the wound bed including Eschar. Assessment Active Problems ICD-10 Non-pressure chronic ulcer of unspecified part of right lower leg with unspecified severity Non-pressure chronic ulcer of other part of left lower leg with unspecified severity Type 2 diabetes mellitus without complications Essential (primary) hypertension Chronic systolic (congestive) heart failure Atherosclerotic heart disease of native coronary artery with unspecified angina pectoris Presence of aortocoronary bypass graft Patient's wounds have shown improvement in size and appearance since last clinic visit. I debrided nonviable tissue. No signs of infection. I recommended continuing Santyl. The areas all appear very dried and I recommended using a foam border dressing over the Santyl with daily dressing changes. Follow-up in 1 week. Procedures Wound #1 Pre-procedure diagnosis of Wound #1 is a Diabetic Wound/Ulcer of the Lower Extremity located on the Right,Posterior Lower Leg .Severity of Tissue Pre Debridement is: Fat layer exposed. There was a Excisional Skin/Subcutaneous Tissue Debridement with a total area of 9.6 sq cm performed by Kalman Shan, MD. With the following instrument(s): Curette to remove Viable and Non-Viable tissue/material. Material removed includes Subcutaneous Tissue, Slough, Skin: Dermis, and Skin: Epidermis. No specimens were taken. A time out was conducted at 13:30, prior to the start of the procedure. A Minimum amount of bleeding was controlled with Pressure. The procedure was tolerated well  with a pain level of 0 throughout and a pain level of 0 following the procedure. Post Debridement Measurements: 3cm length x 3.2cm width x 0.1cm depth; 0.754cm^3 volume. Character of Wound/Ulcer Post Debridement is improved. Severity of  Tissue Post Debridement is: Fat layer exposed. Post procedure Diagnosis Wound #1: Same as Pre-Procedure Wound #2 Pre-procedure diagnosis of Wound #2 is a Diabetic Wound/Ulcer of the Lower Extremity located on the Right,Medial Lower Leg .Severity of Tissue Pre Debridement is: Fat layer exposed. There was a Excisional Skin/Subcutaneous Tissue Debridement with a total area of 2 sq cm performed by Kalman Shan, MD. With the following instrument(s): Curette to remove Viable and Non-Viable tissue/material. Material removed includes Subcutaneous Tissue, Slough, Skin: Dermis, and Skin: Epidermis. No specimens were taken. A time out was conducted at 13:30, prior to the start of the procedure. A Minimum amount of bleeding was controlled with Pressure. The procedure was tolerated well with a pain level of 0 throughout and a pain level of 0 following the procedure. Post Debridement Measurements: 2cm length x 1cm width x 0.1cm depth; 0.157cm^3 volume. Character of Wound/Ulcer Post Debridement is improved. Severity of Tissue Post Debridement is: Fat layer exposed. Post procedure Diagnosis Wound #2: Same as Pre-Procedure Wound #5 Pre-procedure diagnosis of Wound #5 is a Diabetic Wound/Ulcer of the Lower Extremity located on the Left,Medial Lower Leg .Severity of Tissue Pre Debridement is: Fat layer exposed. There was a Excisional Skin/Subcutaneous Tissue Debridement with a total area of 0.49 sq cm performed by Kalman Shan, MD. With the following instrument(s): Curette to remove Viable and Non-Viable tissue/material. Material removed includes Subcutaneous Tissue, Slough, Skin: Dermis, and Skin: Epidermis. No specimens were taken. A time out was conducted at 13:30, prior to the start of the procedure. A Minimum amount of bleeding was controlled with Pressure. The procedure was tolerated well with a pain level of 0 throughout and a pain level of 0 following the procedure. Post Debridement Measurements: 0.7cm  length x 0.7cm width x 0.1cm depth; 0.038cm^3 volume. Character of Wound/Ulcer Post Debridement is improved. Severity of Tissue Post Debridement is: Fat layer exposed. Post procedure Diagnosis Wound #5: Same as Pre-Procedure DELLIS, VOGHT ([id number]) Plan Follow-up Appointments: Return Appointment in 1 week. Bathing/ Shower/ Hygiene: May shower; gently cleanse wound with antibacterial soap, rinse and pat dry prior to dressing wounds Edema Control - Lymphedema / Segmental Compressive Device / Other: Elevate, Exercise Daily and Avoid Standing for Long Periods of Time. Elevate legs to the level of the heart and pump ankles as often as possible Elevate leg(s) parallel to the floor when sitting. WOUND #1: - Lower Leg Wound Laterality: Right, Posterior Cleanser: Byram Ancillary Kit - 15 Day Supply (Generic) 1 x Per Day/30 Days Discharge Instructions: Use supplies as instructed; Kit contains: (15) Saline Bullets; (15) 3x3 Gauze; 15 pr Gloves Cleanser: Soap and Water 1 x Per Day/30 Days Discharge Instructions: Gently cleanse wound with antibacterial soap, rinse and pat dry prior to dressing wounds Primary Dressing: Gauze (Generic) 1 x Per Day/30 Days Discharge Instructions: As directed: dry, moistened with saline or moistened with Dakins Solution Primary Dressing: Santyl Collagenase Ointment, 30 (gm), tube 1 x Per Day/30 Days Secondary Dressing: Zetuvit Plus Silicone Border Dressing 4x4 (in/in) (DME) (Generic) 1 x Per Day/30 Days WOUND #2: - Lower Leg Wound Laterality: Right, Medial Cleanser: Byram Ancillary Kit - 15 Day Supply (Generic) 1 x Per Day/30 Days Discharge Instructions: Use supplies as instructed; Kit contains: (15) Saline Bullets; (15) 3x3 Gauze; 15 pr Gloves  Cleanser: Soap and Water 1 x Per Day/30 Days Discharge Instructions: Gently cleanse wound with antibacterial soap, rinse and pat dry prior to dressing wounds Primary Dressing: Gauze (Generic) 1 x Per Day/30 Days Discharge  Instructions: As directed: dry, moistened with saline or moistened with Dakins Solution Primary Dressing: Santyl Collagenase Ointment, 30 (gm), tube 1 x Per Day/30 Days Secondary Dressing: Kerlix 4.5 x 4.1 (in/yd) 1 x Per Day/30 Days Discharge Instructions: Apply Kerlix 4.5 x 4.1 (in/yd) as instructed Secondary Dressing: Zetuvit Plus Silicone Border Dressing 4x4 (in/in) (DME) (Generic) 1 x Per Day/30 Days WOUND #3: - Lower Leg Wound Laterality: Right, Distal Cleanser: Byram Ancillary Kit - 15 Day Supply (Generic) 1 x Per Day/30 Days Discharge Instructions: Use supplies as instructed; Kit contains: (15) Saline Bullets; (15) 3x3 Gauze; 15 pr Gloves Cleanser: Soap and Water 1 x Per Day/30 Days Discharge Instructions: Gently cleanse wound with antibacterial soap, rinse and pat dry prior to dressing wounds Primary Dressing: Gauze (Generic) 1 x Per Day/30 Days Discharge Instructions: As directed: dry, moistened with saline or moistened with Dakins Solution Primary Dressing: Santyl Collagenase Ointment, 30 (gm), tube 1 x Per Day/30 Days Secondary Dressing: Zetuvit Plus Silicone Border Dressing 4x4 (in/in) 1 x Per Day/30 Days WOUND #5: - Lower Leg Wound Laterality: Left, Medial Cleanser: Byram Ancillary Kit - 15 Day Supply (Generic) 1 x Per Day/30 Days Discharge Instructions: Use supplies as instructed; Kit contains: (15) Saline Bullets; (15) 3x3 Gauze; 15 pr Gloves Cleanser: Soap and Water 1 x Per Day/30 Days Discharge Instructions: Gently cleanse wound with antibacterial soap, rinse and pat dry prior to dressing wounds Primary Dressing: Gauze (Generic) 1 x Per Day/30 Days Discharge Instructions: As directed: dry, moistened with saline or moistened with Dakins Solution Primary Dressing: Santyl Collagenase Ointment, 30 (gm), tube 1 x Per Day/30 Days Secondary Dressing: Zetuvit Plus Silicone Border Dressing 4x4 (in/in) (DME) (Generic) 1 x Per Day/30 Days 1. In office sharp debridement 2. Santyl  daily 3. Follow-up in 1 week Electronic Signature(s) Signed: 08/20/2021 1:46:27 PM By: Kalman Shan DO Entered By: Kalman Shan on 08/20/2021 13:45:16 [name] ([id number]) -------------------------------------------------------------------------------- ROS/PFSH Details Patient Name: SHAFT, CORIGLIANO. Date of Service: 08/20/2021 12:30 PM Medical Record Number: [id number] Patient Account Number: [id number] Date of Birth/Sex: 1961/09/18 (60 y.o. M) Treating RN: Carlene Coria Primary Care Provider: Karl Ito Other Clinician: Referring Provider: Karl Ito Treating Provider/Extender: Yaakov Guthrie in Treatment: 2 Information Obtained From Patient Cardiovascular Medical History: Positive for: Coronary Artery Disease; Hypertension; Myocardial Infarction Endocrine Medical History: Positive for: Type II Diabetes Time with diabetes: 7 years Treated with: Oral agents Blood sugar tested every day: Yes Tested : Immunizations Pneumococcal Vaccine: Received Pneumococcal Vaccination: No Implantable Devices None Family and Social History Former smoker; Marital Status - Separated; Alcohol Use: Never; Drug Use: No History; Caffeine Use: Daily; Financial Concerns: No; Food, Clothing or Shelter Needs: No; Support System Lacking: No; Transportation Concerns: No Electronic Signature(s) Signed: 08/20/2021 1:46:27 PM By: Kalman Shan DO Signed: 08/21/2021 5:07:02 PM By: Carlene Coria RN Entered By: Kalman Shan on 08/20/2021 13:42:07 ANTONIA, CULBERTSON ([id number]) -------------------------------------------------------------------------------- [place] Details Patient Name: MAISEN, KLINGLER. Date of Service: 08/20/2021 Medical Record Number: [id number] Patient Account Number: [id number] Date of Birth/Sex: Dec 16, 1960 (59 y.o. 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 with  unspecified severity L97.829 Non-pressure chronic ulcer of other part of left lower leg with unspecified severity E11.9 Type 2 diabetes mellitus without complications C16 Essential (primary) hypertension L84.53 Chronic systolic (congestive) heart failure I25.119 Atherosclerotic heart disease of native coronary artery with unspecified angina pectoris Z95.1 Presence of aortocoronary bypass graft Facility Procedures CPT4 Code: [id number] Description: 22482 - DEB SUBQ TISSUE 20 SQ CM/< Modifier: Quantity: 1 CPT4 Code: Description: ICD-10 Diagnosis Description L97.919 Non-pressure chronic ulcer of unspecified part of right lower leg with unspec L97.829 Non-pressure chronic ulcer of other part of left lower leg with unspecified s E11.9 Type 2 diabetes mellitus without  complications N00.37 Chronic systolic (congestive) heart failure Modifier: ified severity everity Quantity: Physician Procedures CPT4 Code: 0488891 Description: 11042 - WC PHYS SUBQ TISS 20 SQ CM Modifier: Quantity: 1 CPT4 Code: Description: ICD-10 Diagnosis Description L97.919 Non-pressure chronic ulcer of unspecified part of right lower leg with unspec L97.829 Non-pressure chronic ulcer of other part of left lower leg with unspecified s E11.9 Type 2 diabetes mellitus without  complications Q94.50 Chronic systolic (congestive) heart failure Modifier: ified severity everity Quantity: Electronic Signature(s) Signed: 08/20/2021 1:46:27 PM By: Kalman Shan DO Entered By: Kalman Shan on 08/20/2021 13:45:45

## 2021-08-27 ENCOUNTER — Encounter (HOSPITAL_BASED_OUTPATIENT_CLINIC_OR_DEPARTMENT_OTHER): Payer: Medicare Other | Admitting: Internal Medicine

## 2021-08-27 ENCOUNTER — Ambulatory Visit (INDEPENDENT_AMBULATORY_CARE_PROVIDER_SITE_OTHER): Payer: Medicare Other

## 2021-08-27 ENCOUNTER — Other Ambulatory Visit: Payer: Self-pay

## 2021-08-27 DIAGNOSIS — L97829 Non-pressure chronic ulcer of other part of left lower leg with unspecified severity: Secondary | ICD-10-CM | POA: Diagnosis not present

## 2021-08-27 DIAGNOSIS — E11622 Type 2 diabetes mellitus with other skin ulcer: Secondary | ICD-10-CM | POA: Diagnosis not present

## 2021-08-27 DIAGNOSIS — L97919 Non-pressure chronic ulcer of unspecified part of right lower leg with unspecified severity: Secondary | ICD-10-CM

## 2021-08-29 NOTE — Progress Notes (Signed)
KASHMERE, STAFFA ([id number]) Visit Report for 08/27/2021 Arrival Information Details Patient Name: [name], [name]. Date of Service: 08/27/2021 12:30 PM Medical Record Number: [id number] Patient Account Number: [id number] Date of Birth/Sex: 1961/04/05 (60 y.o. M) Treating RN: Carlene Coria Primary Care Chi Woodham: Karl Ito Other Clinician: Referring Kaylena Pacifico: Karl Ito Treating Shaneya Taketa/Extender: Yaakov Guthrie in Treatment: 3 Visit Information History Since Last Visit All ordered tests and consults were completed: No Patient Arrived: Ambulatory Added or deleted any medications: No Arrival Time: 12:35 Any new allergies or adverse reactions: No Accompanied By: self Had a fall or experienced change in No Transfer Assistance: None activities of daily living that may affect Patient Identification Verified: Yes risk of falls: Secondary Verification Process Completed: Yes Signs or symptoms of abuse/neglect since last visito No Patient Requires Transmission-Based Precautions: No Hospitalized since last visit: No Patient Has Alerts: No Implantable device outside of the clinic excluding No cellular tissue based products placed in the center since last visit: Has Dressing in Place as Prescribed: Yes Pain Present Now: No Electronic Signature(s) Signed: 08/29/2021 3:56:47 PM By: Carlene Coria RN Entered By: Carlene Coria on 08/27/2021 12:40:50 [name] ([id number]) -------------------------------------------------------------------------------- Clinic Level of Care Assessment Details Patient Name: [name], [name]. Date of Service: 08/27/2021 12:30 PM Medical Record Number: [id number] Patient Account Number: [id number] Date of Birth/Sex: 20-Jul-1961 (60 y.o. M) Treating RN: Carlene Coria Primary Care Jafeth Mustin: Karl Ito Other Clinician: Referring Cyara Devoto: Karl Ito Treating Corley Maffeo/Extender: Yaakov Guthrie in Treatment: 3 Clinic Level of Care Assessment  Items TOOL 1 Quantity Score '[]'  - Use when EandM and Procedure is performed on INITIAL visit 0 ASSESSMENTS - Nursing Assessment / Reassessment '[]'  - General Physical Exam (combine w/ comprehensive assessment (listed just below) when performed on new 0 pt. evals) '[]'  - 0 Comprehensive Assessment (HX, ROS, Risk Assessments, Wounds Hx, etc.) ASSESSMENTS - Wound and Skin Assessment / Reassessment '[]'  - Dermatologic / Skin Assessment (not related to wound area) 0 ASSESSMENTS - Ostomy and/or Continence Assessment and Care '[]'  - Incontinence Assessment and Management 0 '[]'  - 0 Ostomy Care Assessment and Management (repouching, etc.) PROCESS - Coordination of Care '[]'  - Simple Patient / Family Education for ongoing care 0 '[]'  - 0 Complex (extensive) Patient / Family Education for ongoing care '[]'  - 0 Staff obtains Programmer, systems, Records, Test Results / Process Orders '[]'  - 0 Staff telephones HHA, Nursing Homes / Clarify orders / etc '[]'  - 0 Routine Transfer to another Facility (non-emergent condition) '[]'  - 0 Routine Hospital Admission (non-emergent condition) '[]'  - 0 New Admissions / Biomedical engineer / Ordering NPWT, Apligraf, etc. '[]'  - 0 Emergency Hospital Admission (emergent condition) PROCESS - Special Needs '[]'  - Pediatric / Minor Patient Management 0 '[]'  - 0 Isolation Patient Management '[]'  - 0 Hearing / Language / Visual special needs '[]'  - 0 Assessment of Community assistance (transportation, D/C planning, etc.) '[]'  - 0 Additional assistance / Altered mentation '[]'  - 0 Support Surface(s) Assessment (bed, cushion, seat, etc.) INTERVENTIONS - Miscellaneous '[]'  - External ear exam 0 '[]'  - 0 Patient Transfer (multiple staff / Civil Service fast streamer / Similar devices) '[]'  - 0 Simple Staple / Suture removal (25 or less) '[]'  - 0 Complex Staple / Suture removal (26 or more) '[]'  - 0 Hypo/Hyperglycemic Management (do not check if billed separately) '[]'  - 0 Ankle / Brachial Index (ABI) - do not check if  billed separately Has the patient been seen at the hospital within the last three years: Yes Total Score: 0 Level  Of Care: ____ [name] ([id number]) Electronic Signature(s) Signed: 08/29/2021 3:56:47 PM By: Carlene Coria RN Entered By: Carlene Coria on 08/27/2021 13:29:10 [name] ([id number]) -------------------------------------------------------------------------------- Encounter Discharge Information Details Patient Name: [name], [name]. Date of Service: 08/27/2021 12:30 PM Medical Record Number: [id number] Patient Account Number: [id number] Date of Birth/Sex: December 08, 1960 (60 y.o. M) Treating RN: Carlene Coria Primary Care Chessica Audia: Karl Ito Other Clinician: Referring Harce Volden: Karl Ito Treating Zachary Lovins/Extender: Yaakov Guthrie in Treatment: 3 Encounter Discharge Information Items Post Procedure Vitals Discharge Condition: Stable Temperature (F): 97.5 Ambulatory Status: Ambulatory Pulse (bpm): 72 Discharge Destination: Home Respiratory Rate (breaths/min): 18 Transportation: Private Auto Blood Pressure (mmHg): 141/75 Accompanied By: daughter Schedule Follow-up Appointment: Yes Clinical Summary of Care: Patient Declined Electronic Signature(s) Signed: 08/27/2021 1:30:28 PM By: Carlene Coria RN Entered By: Carlene Coria on 08/27/2021 13:30:27 [name] ([id number]) -------------------------------------------------------------------------------- Lower Extremity Assessment Details Patient Name: [name], [name]. Date of Service: 08/27/2021 12:30 PM Medical Record Number: [id number] Patient Account Number: [id number] Date of Birth/Sex: [birth year] (60 y.o. M) Treating RN: Carlene Coria Primary Care Salvador Bigbee: Karl Ito Other Clinician: Referring Everhett Bozard: Karl Ito Treating Cassius Cullinane/Extender: Yaakov Guthrie in Treatment: 3 Edema Assessment Assessed: [Left: No] [Right: No] Edema: [Left: Yes] [Right: Yes] Calf Left: Right: Point of  Measurement: 32 cm From Medial Instep 35.5 cm 34 cm Ankle Left: Right: Point of Measurement: 10 cm From Medial Instep 21 cm 20.7 cm Knee To Floor Left: Right: From Medial Instep 41 cm 41 cm Vascular Assessment Pulses: Dorsalis Pedis Palpable: [Left:Yes] [Right:Yes] Electronic Signature(s) Signed: 08/29/2021 3:56:47 PM By: Carlene Coria RN Entered By: Carlene Coria on 08/27/2021 12:53:14 [name] ([id number]) -------------------------------------------------------------------------------- Multi Wound Chart Details Patient Name: [name]. Date of Service: 08/27/2021 12:30 PM Medical Record Number: [id number] Patient Account Number: [id number] Date of Birth/Sex: [birth year] (60 y.o. M) Treating RN: Carlene Coria Primary Care Mc Hollen: Karl Ito Other Clinician: Referring Julianne Chamberlin: Karl Ito Treating Emylia Latella/Extender: Yaakov Guthrie in Treatment: 3 Vital Signs Height(in): 65 Pulse(bpm): 11 Weight(lbs): 180 Blood Pressure(mmHg): 141/75 Body Mass Index(BMI): 30 Temperature(F): 97.5 Respiratory Rate(breaths/min): 18 Photos: Wound Location: Right, Posterior Lower Leg Right, Medial Lower Leg Right, Distal Lower Leg Wounding Event: Gradually Appeared Gradually Appeared Gradually Appeared Primary Etiology: Diabetic Wound/Ulcer of the Lower Diabetic Wound/Ulcer of the Lower Diabetic Wound/Ulcer of the Lower Extremity Extremity Extremity Comorbid History: Coronary Artery Disease, Coronary Artery Disease, Coronary Artery Disease, Hypertension, Myocardial Infarction, Hypertension, Myocardial Infarction, Hypertension, Myocardial Infarction, Type II Diabetes Type II Diabetes Type II Diabetes Date Acquired: 07/19/2021 07/19/2021 07/19/2021 Weeks of Treatment: '3 3 3 ' Wound Status: Open Open Open Measurements L x W x D (cm) 2.5x2x0.1 1.2x1x0.1 0x0x0 Area (cm) : 3.927 0.942 0 Volume (cm) : 0.393 0.094 0 % Reduction in Area: 64.30% 69.20% 100.00% % Reduction in  Volume: 64.30% 69.30% 100.00% Classification: Grade 2 Grade 2 Grade 2 Exudate Amount: None Present Medium None Present Exudate Type: N/A Serosanguineous N/A Exudate Color: N/A red, brown N/A Granulation Amount: Small (1-33%) Small (1-33%) None Present (0%) Granulation Quality: Red Red N/A Necrotic Amount: Large (67-100%) Large (67-100%) None Present (0%) Necrotic Tissue: Adherent Hardin N/A Exposed Structures: Fat Layer (Subcutaneous Tissue): Fat Layer (Subcutaneous Tissue): Fascia: No Yes Yes Fat Layer (Subcutaneous Tissue): Fascia: No Fascia: No No Tendon: No Tendon: No Tendon: No Muscle: No Muscle: No Muscle: No Joint: No Joint: No Joint: No Bone: No Bone: No Bone: No Epithelialization: None None Large (67-100%) Debridement: Debridement - Excisional Debridement - Excisional  N/A Pre-procedure Verification/Time 13:00 13:00 N/A Out Taken: Pain Control: Lidocaine 4% Topical Solution Lidocaine 4% Topical Solution N/A Tissue Debrided: Subcutaneous, Slough Subcutaneous, Slough N/A Level: Skin/Subcutaneous Tissue Skin/Subcutaneous Tissue N/A Debridement Area (sq cm): 5 1.2 N/A Instrument: Curette Curette N/A Bleeding: Minimum Minimum N/A Hemostasis Achieved: Pressure Pressure N/A Procedural Pain: 0 0 N/A [name], GAUT. ([id number]) Post Procedural Pain: 0 0 N/A Debridement Treatment Procedure was tolerated well Procedure was tolerated well N/A Response: Post Debridement 2.5x2x0.1 1.2x1x0.1 N/A Measurements L x W x D (cm) Post Debridement Volume: 0.393 0.094 N/A (cm) Procedures Performed: Debridement Debridement N/A Wound Number: 5 N/A N/A Photos: N/A N/A Wound Location: Left, Medial Lower Leg N/A N/A Wounding Event: Gradually Appeared N/A N/A Primary Etiology: Diabetic Wound/Ulcer of the Lower N/A N/A Extremity Comorbid History: Coronary Artery Disease, N/A N/A Hypertension, Myocardial Infarction, Type II Diabetes Date Acquired: 07/19/2021 N/A  N/A Weeks of Treatment: 3 N/A N/A Wound Status: Open N/A N/A Measurements L x W x D (cm) 0.5x0.5x0.1 N/A N/A Area (cm) : 0.196 N/A N/A Volume (cm) : 0.02 N/A N/A % Reduction in Area: 75.00% N/A N/A % Reduction in Volume: 74.70% N/A N/A Classification: Grade 2 N/A N/A Exudate Amount: Medium N/A N/A Exudate Type: Serosanguineous N/A N/A Exudate Color: red, brown N/A N/A Granulation Amount: Small (1-33%) N/A N/A Granulation Quality: Red N/A N/A Necrotic Amount: Large (67-100%) N/A N/A Necrotic Tissue: Eschar N/A N/A Exposed Structures: Fat Layer (Subcutaneous Tissue): N/A N/A Yes Fascia: No Tendon: No Muscle: No Joint: No Bone: No Epithelialization: None N/A N/A Debridement: Debridement - Excisional N/A N/A Pre-procedure Verification/Time 13:00 N/A N/A Out Taken: Pain Control: Lidocaine 4% Topical Solution N/A N/A Tissue Debrided: Subcutaneous, Slough N/A N/A Level: Skin/Subcutaneous Tissue N/A N/A Debridement Area (sq cm): 0.25 N/A N/A Instrument: Curette N/A N/A Bleeding: Minimum N/A N/A Hemostasis Achieved: Pressure N/A N/A Procedural Pain: 0 N/A N/A Post Procedural Pain: 0 N/A N/A Debridement Treatment Procedure was tolerated well N/A N/A Response: Post Debridement 0.5x0.5x0.1 N/A N/A Measurements L x W x D (cm) Post Debridement Volume: 0.02 N/A N/A (cm) Procedures Performed: Debridement N/A N/A Treatment Notes [name], [name] ([id number]) Wound #1 (Lower Leg) Wound Laterality: Right, Posterior Cleanser Byram Ancillary Kit - 15 Day Supply Discharge Instruction: Use supplies as instructed; Kit contains: (15) Saline Bullets; (15) 3x3 Gauze; 15 pr Gloves Soap and Water Discharge Instruction: Gently cleanse wound with antibacterial soap, rinse and pat dry prior to dressing wounds Peri-Wound Care Topical Primary Dressing Gauze Discharge Instruction: As directed: dry, moistened with saline or moistened with Dakins Solution Santyl Collagenase Ointment, 30 (gm),  tube Secondary Dressing Zetuvit Plus Silicone Border Dressing 4x4 (in/in) Secured With Compression Wrap Compression Stockings Add-Ons Wound #2 (Lower Leg) Wound Laterality: Right, Medial Cleanser Byram Ancillary Kit - 15 Day Supply Discharge Instruction: Use supplies as instructed; Kit contains: (15) Saline Bullets; (15) 3x3 Gauze; 15 pr Gloves Soap and Water Discharge Instruction: Gently cleanse wound with antibacterial soap, rinse and pat dry prior to dressing wounds Peri-Wound Care Topical Primary Dressing Gauze Discharge Instruction: As directed: dry, moistened with saline or moistened with Dakins Solution Santyl Collagenase Ointment, 30 (gm), tube Secondary Dressing Kerlix 4.5 x 4.1 (in/yd) Discharge Instruction: Apply Kerlix 4.5 x 4.1 (in/yd) as instructed Zetuvit Plus Silicone Border Dressing 4x4 (in/in) Secured With Compression Wrap Compression Stockings Add-Ons Wound #3 (Lower Leg) Wound Laterality: Right, Distal Cleanser Peri-Wound Care Topical Primary Dressing Secondary Dressing [name], [name] ([id number]) Secured With Compression Wrap Compression Stockings Add-Ons Wound #5 (  Lower Leg) Wound Laterality: Left, Medial Cleanser Byram Ancillary Kit - 15 Day Supply Discharge Instruction: Use supplies as instructed; Kit contains: (15) Saline Bullets; (15) 3x3 Gauze; 15 pr Gloves Soap and Water Discharge Instruction: Gently cleanse wound with antibacterial soap, rinse and pat dry prior to dressing wounds Peri-Wound Care Topical Primary Dressing Gauze Discharge Instruction: As directed: dry, moistened with saline or moistened with Dakins Solution Santyl Collagenase Ointment, 30 (gm), tube Secondary Dressing Zetuvit Plus Silicone Border Dressing 4x4 (in/in) Secured With Compression Wrap Compression Stockings Add-Ons Electronic Signature(s) Signed: 08/27/2021 1:35:44 PM By: Kalman Shan DO Entered By: Kalman Shan on 08/27/2021 13:30:31 [name] ([id number]) -------------------------------------------------------------------------------- [place] Details Patient Name: FLEETWOOD, PIERRON. Date of Service: 08/27/2021 12:30 PM Medical Record Number: [id number] Patient Account Number: [id number] Date of Birth/Sex: 10/05/1961 (60 y.o. M) Treating RN: Carlene Coria Primary Care Alizah Sills: Karl Ito Other Clinician: Referring Makayli Bracken: Karl Ito Treating Alexee Delsanto/Extender: Yaakov Guthrie in Treatment: 3 Active Inactive Wound/Skin Impairment Nursing Diagnoses: Knowledge deficit related to ulceration/compromised skin integrity Goals: Patient/caregiver will verbalize understanding of skin care regimen Date Initiated: 08/06/2021 Date Inactivated: 08/13/2021 Target Resolution Date: 09/06/2021 Goal Status: Met Ulcer/skin breakdown will have a volume reduction of 30% by week 4 Date Initiated: 08/06/2021 Target Resolution Date: 10/06/2021 Goal Status: Active Ulcer/skin breakdown will have a volume reduction of 50% by week 8 Date Initiated: 08/06/2021 Target Resolution Date: 11/06/2021 Goal Status: Active Ulcer/skin breakdown will have a volume reduction of 80% by week 12 Date Initiated: 08/06/2021 Target Resolution Date: 12/07/2021 Goal Status: Active Ulcer/skin breakdown will heal within 14 weeks Date Initiated: 08/06/2021 Target Resolution Date: 01/04/2022 Goal Status: Active Interventions: Assess patient/caregiver ability to obtain necessary supplies Assess patient/caregiver ability to perform ulcer/skin care regimen upon admission and as needed Assess ulceration(s) every visit Notes: Electronic Signature(s) Signed: 08/29/2021 3:56:47 PM By: Carlene Coria RN Entered By: Carlene Coria on 08/27/2021 12:54:59 [name] ([id number]) -------------------------------------------------------------------------------- Pain Assessment Details Patient Name: AKONI, PARTON. Date of Service:  08/27/2021 12:30 PM Medical Record Number: [id number] Patient Account Number: [id number] Date of Birth/Sex: [birth year] (60 y.o. M) Treating RN: Carlene Coria Primary Care Millissa Deese: Karl Ito Other Clinician: Referring Baylynn Shifflett: Karl Ito Treating Len Kluver/Extender: Yaakov Guthrie in Treatment: 3 Active Problems Location of Pain Severity and Description of Pain Patient Has Paino No Site Locations Pain Management and Medication Current Pain Management: Electronic Signature(s) Signed: 08/29/2021 3:56:47 PM By: Carlene Coria RN Entered By: Carlene Coria on 08/27/2021 12:41:20 [name] ([id number]) -------------------------------------------------------------------------------- Patient/Caregiver Education Details Patient Name: PERRY, BRUCATO. Date of Service: 08/27/2021 12:30 PM Medical Record Number: [id number] Patient Account Number: [id number] Date of Birth/Gender: December 18, 1960 (60 y.o. M) Treating RN: Carlene Coria Primary Care Physician: Karl Ito Other Clinician: Referring Physician: Karl Ito Treating Physician/Extender: Yaakov Guthrie in Treatment: 3 Education Assessment Education Provided To: Patient Education Topics Provided Wound/Skin Impairment: Methods: Explain/Verbal Responses: State content correctly Electronic Signature(s) Signed: 08/29/2021 3:56:47 PM By: Carlene Coria RN Entered By: Carlene Coria on 08/27/2021 13:29:31 [name], [name] ([id number]) -------------------------------------------------------------------------------- Wound Assessment Details Patient Name: [name], [name]. Date of Service: 08/27/2021 12:30 PM Medical Record Number: [id number] Patient Account Number: [id number] Date of Birth/Sex: 1961/06/02 (60 y.o. M) Treating RN: Carlene Coria Primary Care Datrell Dunton: Karl Ito Other Clinician: Referring Andrew Blasius: Karl Ito Treating Breyden Jeudy/Extender: Yaakov Guthrie in Treatment: 3 Wound Status Wound Number: 1  Primary Diabetic Wound/Ulcer of the Lower Extremity Etiology: Wound Location: Right, Posterior Lower Leg Wound Status: Open Wounding Event: Gradually  Appeared Comorbid Coronary Artery Disease, Hypertension, Myocardial Date Acquired: 07/19/2021 History: Infarction, Type II Diabetes Weeks Of Treatment: 3 Clustered Wound: No Photos Wound Measurements Length: (cm) 2.5 Width: (cm) 2 Depth: (cm) 0.1 Area: (cm) 3.927 Volume: (cm) 0.393 % Reduction in Area: 64.3% % Reduction in Volume: 64.3% Epithelialization: None Tunneling: No Undermining: No Wound Description Classification: Grade 2 Exudate Amount: None Present Foul Odor After Cleansing: No Slough/Fibrino Yes Wound Bed Granulation Amount: Small (1-33%) Exposed Structure Granulation Quality: Red Fascia Exposed: No Necrotic Amount: Large (67-100%) Fat Layer (Subcutaneous Tissue) Exposed: Yes Necrotic Quality: Adherent Slough Tendon Exposed: No Muscle Exposed: No Joint Exposed: No Bone Exposed: No Treatment Notes Wound #1 (Lower Leg) Wound Laterality: Right, Posterior Cleanser Byram Ancillary Kit - 15 Day Supply Discharge Instruction: Use supplies as instructed; Kit contains: (15) Saline Bullets; (15) 3x3 Gauze; 15 pr Gloves Soap and Water Discharge Instruction: Gently cleanse wound with antibacterial soap, rinse and pat dry prior to dressing wounds SIRE, POET ([id number]) Peri-Wound Care Topical Primary Dressing Gauze Discharge Instruction: As directed: dry, moistened with saline or moistened with Dakins Solution Santyl Collagenase Ointment, 30 (gm), tube Secondary Dressing Zetuvit Plus Silicone Border Dressing 4x4 (in/in) Secured With Compression Wrap Compression Stockings Add-Ons Electronic Signature(s) Signed: 08/29/2021 3:56:47 PM By: Carlene Coria RN Entered By: Carlene Coria on 08/27/2021 12:50:00 [name]  ([id number]) -------------------------------------------------------------------------------- Wound Assessment Details Patient Name: [name], [name]. Date of Service: 08/27/2021 12:30 PM Medical Record Number: [id number] Patient Account Number: [id number] Date of Birth/Sex: 08/28/1961 (60 y.o. M) Treating RN: Carlene Coria Primary Care Zyshawn Bohnenkamp: Karl Ito Other Clinician: Referring Floy Angert: Karl Ito Treating Naileah Karg/Extender: Yaakov Guthrie in Treatment: 3 Wound Status Wound Number: 2 Primary Diabetic Wound/Ulcer of the Lower Extremity Etiology: Wound Location: Right, Medial Lower Leg Wound Status: Open Wounding Event: Gradually Appeared Comorbid Coronary Artery Disease, Hypertension, Myocardial Date Acquired: 07/19/2021 History: Infarction, Type II Diabetes Weeks Of Treatment: 3 Clustered Wound: No Photos Wound Measurements Length: (cm) 1.2 Width: (cm) 1 Depth: (cm) 0.1 Area: (cm) 0.942 Volume: (cm) 0.094 % Reduction in Area: 69.2% % Reduction in Volume: 69.3% Epithelialization: None Tunneling: No Undermining: No Wound Description Classification: Grade 2 Exudate Amount: Medium Exudate Type: Serosanguineous Exudate Color: red, brown Foul Odor After Cleansing: No Slough/Fibrino Yes Wound Bed Granulation Amount: Small (1-33%) Exposed Structure Granulation Quality: Red Fascia Exposed: No Necrotic Amount: Large (67-100%) Fat Layer (Subcutaneous Tissue) Exposed: Yes Necrotic Quality: Adherent Slough Tendon Exposed: No Muscle Exposed: No Joint Exposed: No Bone Exposed: No Treatment Notes Wound #2 (Lower Leg) Wound Laterality: Right, Medial Cleanser Byram Ancillary Kit - 15 Day Supply Discharge Instruction: Use supplies as instructed; Kit contains: (15) Saline Bullets; (15) 3x3 Gauze; 15 pr Gloves Soap and Water Discharge Instruction: Gently cleanse wound with antibacterial soap, rinse and pat dry prior to dressing wounds ADISA, LITT  ([id number]) Peri-Wound Care Topical Primary Dressing Gauze Discharge Instruction: As directed: dry, moistened with saline or moistened with Dakins Solution Santyl Collagenase Ointment, 30 (gm), tube Secondary Dressing Kerlix 4.5 x 4.1 (in/yd) Discharge Instruction: Apply Kerlix 4.5 x 4.1 (in/yd) as instructed Zetuvit Plus Silicone Border Dressing 4x4 (in/in) Secured With Compression Wrap Compression Stockings Add-Ons Electronic Signature(s) Signed: 08/29/2021 3:56:47 PM By: Carlene Coria RN Entered By: Carlene Coria on 08/27/2021 12:52:06 [name] ([id number]) -------------------------------------------------------------------------------- Wound Assessment Details Patient Name: [name], [name]. Date of Service: 08/27/2021 12:30 PM Medical Record Number: [id number] Patient Account Number: [id number] Date of Birth/Sex: [birth year] (60 y.o. M) Treating RN: Carlene Coria Primary  Care Gracilyn Gunia: Karl Ito Other Clinician: Referring Badr Piedra: Karl Ito Treating Avelyn Touch/Extender: Yaakov Guthrie in Treatment: 3 Wound Status Wound Number: 3 Primary Diabetic Wound/Ulcer of the Lower Extremity Etiology: Wound Location: Right, Distal Lower Leg Wound Status: Open Wounding Event: Gradually Appeared Comorbid Coronary Artery Disease, Hypertension, Myocardial Date Acquired: 07/19/2021 History: Infarction, Type II Diabetes Weeks Of Treatment: 3 Clustered Wound: No Photos Wound Measurements Length: (cm) Width: (cm) Depth: (cm) Area: (cm) Volume: (cm) 0 % Reduction in Area: 100% 0 % Reduction in Volume: 100% 0 Epithelialization: Large (67-100%) 0 Tunneling: No 0 Undermining: No Wound Description Classification: Grade 2 Exudate Amount: None Present Foul Odor After Cleansing: No Slough/Fibrino No Wound Bed Granulation Amount: None Present (0%) Exposed Structure Necrotic Amount: None Present (0%) Fascia Exposed: No Fat Layer (Subcutaneous Tissue) Exposed:  No Tendon Exposed: No Muscle Exposed: No Joint Exposed: No Bone Exposed: No Electronic Signature(s) Signed: 08/29/2021 3:56:47 PM By: Carlene Coria RN Entered By: Carlene Coria on 08/27/2021 12:51:08 [name] ([id number]) -------------------------------------------------------------------------------- Wound Assessment Details Patient Name: [name], [name]. Date of Service: 08/27/2021 12:30 PM Medical Record Number: [id number] Patient Account Number: [id number] Date of Birth/Sex: [birth year] (60 y.o. M) Treating RN: Carlene Coria Primary Care Luismario Coston: Karl Ito Other Clinician: Referring Azura Tufaro: Karl Ito Treating Miria Cappelli/Extender: Yaakov Guthrie in Treatment: 3 Wound Status Wound Number: 5 Primary Diabetic Wound/Ulcer of the Lower Extremity Etiology: Wound Location: Left, Medial Lower Leg Wound Status: Open Wounding Event: Gradually Appeared Comorbid Coronary Artery Disease, Hypertension, Myocardial Date Acquired: 07/19/2021 History: Infarction, Type II Diabetes Weeks Of Treatment: 3 Clustered Wound: No Photos Wound Measurements Length: (cm) 0.5 Width: (cm) 0.5 Depth: (cm) 0.1 Area: (cm) 0.196 Volume: (cm) 0.02 % Reduction in Area: 75% % Reduction in Volume: 74.7% Epithelialization: None Tunneling: No Undermining: No Wound Description Classification: Grade 2 Exudate Amount: Medium Exudate Type: Serosanguineous Exudate Color: red, brown Foul Odor After Cleansing: No Slough/Fibrino Yes Wound Bed Granulation Amount: Small (1-33%) Exposed Structure Granulation Quality: Red Fascia Exposed: No Necrotic Amount: Large (67-100%) Fat Layer (Subcutaneous Tissue) Exposed: Yes Necrotic Quality: Eschar Tendon Exposed: No Muscle Exposed: No Joint Exposed: No Bone Exposed: No Treatment Notes Wound #5 (Lower Leg) Wound Laterality: Left, Medial Cleanser Byram Ancillary Kit - 15 Day Supply Discharge Instruction: Use supplies as instructed; Kit  contains: (15) Saline Bullets; (15) 3x3 Gauze; 15 pr Gloves Soap and Water Discharge Instruction: Gently cleanse wound with antibacterial soap, rinse and pat dry prior to dressing wounds SHAQUILL, ISEMAN ([id number]) Peri-Wound Care Topical Primary Dressing Gauze Discharge Instruction: As directed: dry, moistened with saline or moistened with Dakins Solution Santyl Collagenase Ointment, 30 (gm), tube Secondary Dressing Zetuvit Plus Silicone Border Dressing 4x4 (in/in) Secured With Compression Wrap Compression Stockings Add-Ons Electronic Signature(s) Signed: 08/29/2021 3:56:47 PM By: Carlene Coria RN Entered By: Carlene Coria on 08/27/2021 12:51:39 [name] ([id number]) -------------------------------------------------------------------------------- Vitals Details Patient Name: [name]. Date of Service: 08/27/2021 12:30 PM Medical Record Number: [id number] Patient Account Number: [id number] Date of Birth/Sex: Mar 21, 1961 (60 y.o. M) Treating RN: Carlene Coria Primary Care Royalty Domagala: Karl Ito Other Clinician: Referring Lilymarie Scroggins: Karl Ito Treating Mearl Harewood/Extender: Yaakov Guthrie in Treatment: 3 Vital Signs Time Taken: 12:40 Temperature (F): 97.5 Height (in): 65 Pulse (bpm): 72 Weight (lbs): 180 Respiratory Rate (breaths/min): 18 Body Mass Index (BMI): 30 Blood Pressure (mmHg): 141/75 Reference Range: 80 - 120 mg / dl Electronic Signature(s) Signed: 08/29/2021 3:56:47 PM By: Carlene Coria RN Entered By: Carlene Coria on 08/27/2021 12:41:12

## 2021-08-29 NOTE — Progress Notes (Signed)
BRAHEEM, TOMASIK ([id number]) Visit Report for 08/27/2021 Chief Complaint Document Details Patient Name: [name], [name]. Date of Service: 08/27/2021 12:30 PM Medical Record Number: [id number] Patient Account Number: [id number] Date of Birth/Sex: 1961/08/19 (60 y.o. M) Treating RN: Carlene Coria Primary Care Provider: Karl Ito Other Clinician: Referring Provider: Karl Ito Treating Provider/Extender: Yaakov Guthrie in Treatment: 3 Information Obtained from: Patient Chief Complaint Bilateral lower extremity wounds Electronic Signature(s) Signed: 08/27/2021 1:35:44 PM By: Kalman Shan DO Entered By: Kalman Shan on 08/27/2021 13:30:45 [name] ([id number]) -------------------------------------------------------------------------------- Debridement Details Patient Name: [name], [name]. Date of Service: 08/27/2021 12:30 PM Medical Record Number: [id number] Patient Account Number: [id number] Date of Birth/Sex: 02-07-1961 (60 y.o. M) Treating RN: Carlene Coria Primary Care Provider: Karl Ito Other Clinician: Referring Provider: Karl Ito Treating Provider/Extender: Yaakov Guthrie in Treatment: 3 Debridement Performed for Wound #2 Right,Medial Lower Leg Assessment: Performed By: Physician Kalman Shan, MD Debridement Type: Debridement Severity of Tissue Pre Debridement: Fat layer exposed Level of Consciousness (Pre- Awake and Alert procedure): Pre-procedure Verification/Time Out Yes - 13:00 Taken: Start Time: 13:00 Pain Control: Lidocaine 4% Topical Solution Total Area Debrided (L x W): 1.2 (cm) x 1 (cm) = 1.2 (cm) Tissue and other material Viable, Non-Viable, Slough, Subcutaneous, Skin: Dermis , Skin: Epidermis, Slough debrided: Level: Skin/Subcutaneous Tissue Debridement Description: Excisional Instrument: Curette Bleeding: Minimum Hemostasis Achieved: Pressure End Time: 13:05 Procedural Pain: 0 Post Procedural Pain:  0 Response to Treatment: Procedure was tolerated well Level of Consciousness (Post- Awake and Alert procedure): Post Debridement Measurements of Total Wound Length: (cm) 1.2 Width: (cm) 1 Depth: (cm) 0.1 Volume: (cm) 0.094 Character of Wound/Ulcer Post Debridement: Improved Severity of Tissue Post Debridement: Fat layer exposed Post Procedure Diagnosis Same as Pre-procedure Electronic Signature(s) Signed: 08/27/2021 1:35:44 PM By: Kalman Shan DO Signed: 08/29/2021 3:56:47 PM By: Carlene Coria RN Entered By: Carlene Coria on 08/27/2021 13:01:36 [name] ([id number]) -------------------------------------------------------------------------------- Debridement Details Patient Name: [name]. Date of Service: 08/27/2021 12:30 PM Medical Record Number: [id number] Patient Account Number: [id number] Date of Birth/Sex: May 03, 1961 (60 y.o. M) Treating RN: Carlene Coria Primary Care Provider: Karl Ito Other Clinician: Referring Provider: Karl Ito Treating Provider/Extender: Yaakov Guthrie in Treatment: 3 Debridement Performed for Wound #1 Right,Posterior Lower Leg Assessment: Performed By: Physician Kalman Shan, MD Debridement Type: Debridement Severity of Tissue Pre Debridement: Fat layer exposed Level of Consciousness (Pre- Awake and Alert procedure): Pre-procedure Verification/Time Out Yes - 13:00 Taken: Start Time: 13:00 Pain Control: Lidocaine 4% Topical Solution Total Area Debrided (L x W): 2.5 (cm) x 2 (cm) = 5 (cm) Tissue and other material Viable, Non-Viable, Slough, Subcutaneous, Skin: Dermis , Skin: Epidermis, Slough debrided: Level: Skin/Subcutaneous Tissue Debridement Description: Excisional Instrument: Curette Bleeding: Minimum Hemostasis Achieved: Pressure End Time: 13:05 Procedural Pain: 0 Post Procedural Pain: 0 Response to Treatment: Procedure was tolerated well Level of Consciousness (Post- Awake and  Alert procedure): Post Debridement Measurements of Total Wound Length: (cm) 2.5 Width: (cm) 2 Depth: (cm) 0.1 Volume: (cm) 0.393 Character of Wound/Ulcer Post Debridement: Improved Severity of Tissue Post Debridement: Fat layer exposed Post Procedure Diagnosis Same as Pre-procedure Electronic Signature(s) Signed: 08/27/2021 1:35:44 PM By: Kalman Shan DO Signed: 08/29/2021 3:56:47 PM By: Carlene Coria RN Entered By: Carlene Coria on 08/27/2021 13:02:11 [name] ([id number]) -------------------------------------------------------------------------------- Debridement Details Patient Name: [name]. Date of Service: 08/27/2021 12:30 PM Medical Record Number: [id number] Patient Account Number: [id number] Date of Birth/Sex: 03-14-1961 (60 y.o. M) Treating RN: Carlene Coria Primary  Care Provider: Karl Ito Other Clinician: Referring Provider: Karl Ito Treating Provider/Extender: Yaakov Guthrie in Treatment: 3 Debridement Performed for Wound #5 Left,Medial Lower Leg Assessment: Performed By: Physician Kalman Shan, MD Debridement Type: Debridement Severity of Tissue Pre Debridement: Fat layer exposed Level of Consciousness (Pre- Awake and Alert procedure): Pre-procedure Verification/Time Out Yes - 13:00 Taken: Start Time: 13:00 Pain Control: Lidocaine 4% Topical Solution Total Area Debrided (L x W): 0.5 (cm) x 0.5 (cm) = 0.25 (cm) Tissue and other material Viable, Non-Viable, Slough, Subcutaneous, Skin: Dermis , Skin: Epidermis, Slough debrided: Level: Skin/Subcutaneous Tissue Debridement Description: Excisional Instrument: Curette Bleeding: Minimum Hemostasis Achieved: Pressure End Time: 13:05 Procedural Pain: 0 Post Procedural Pain: 0 Response to Treatment: Procedure was tolerated well Level of Consciousness (Post- Awake and Alert procedure): Post Debridement Measurements of Total Wound Length: (cm) 0.5 Width: (cm) 0.5 Depth: (cm)  0.1 Volume: (cm) 0.02 Character of Wound/Ulcer Post Debridement: Improved Severity of Tissue Post Debridement: Fat layer exposed Post Procedure Diagnosis Same as Pre-procedure Electronic Signature(s) Signed: 08/27/2021 1:35:44 PM By: Kalman Shan DO Signed: 08/29/2021 3:56:47 PM By: Carlene Coria RN Entered By: Carlene Coria on 08/27/2021 13:02:58 [name] ([id number]) -------------------------------------------------------------------------------- HPI Details Patient Name: [name], [name]. Date of Service: 08/27/2021 12:30 PM Medical Record Number: [id number] Patient Account Number: [id number] Date of Birth/Sex: 07-07-1961 (60 y.o. M) Treating RN: Carlene Coria Primary Care Provider: Karl Ito Other Clinician: Referring Provider: Karl Ito Treating Provider/Extender: Yaakov Guthrie in Treatment: 3 History of Present Illness HPI Description: Admission 08/06/2021 Mr. Vimal Derego is a 60 year old male with a past medical history of uncontrolled insulin-dependent type 2 diabetes, current cigarette smoker, CABG, chronic systolic congestive heart failure and history of osteomyelitis of the left foot status post fifth digit amputation that presents to the clinic for a 2-week history of nonhealing wounds to his lower extremities bilaterally. He states that 1 evening he developed blisters on his legs that eventually popped and developed tightly adhered nonviable tissue. He was evaluated by his primary care office and they noted increased erythema to the periwound's and he was started on doxycycline. He is currently taking this and reports improvement in his symptoms. He denies pain. He denies drainage. 10/26; patient presents for follow-up. He was able to start [facility] and has been using this for the past week. He no longer experiences pain or increased warmth or redness to the periwound's. He has no issues or complaints today. 11/2; patient presents for follow-up. He has  been using Santyl daily. He has no issues or complaints today. He denies signs of infection. 11/9; patient presents for follow-up. He continues to use Santyl daily to the wound beds. He has no issues or complaints today. He denies signs of infection. Electronic Signature(s) Signed: 08/27/2021 1:35:44 PM By: Kalman Shan DO Entered By: Kalman Shan on 08/27/2021 13:31:09 [name] ([id number]) -------------------------------------------------------------------------------- Physical Exam Details Patient Name: [name], [name]. Date of Service: 08/27/2021 12:30 PM Medical Record Number: [id number] Patient Account Number: [id number] Date of Birth/Sex: May 05, 1961 (60 y.o. M) Treating RN: Carlene Coria Primary Care Provider: Karl Ito Other Clinician: Referring Provider: Karl Ito Treating Provider/Extender: Yaakov Guthrie in Treatment: 3 Constitutional . Cardiovascular . Psychiatric . Notes Bilateral lower extremity wounds with nonviable tissue. Post debridement there is some granulation tissue present. No signs of infection. Posterior right lower extremity wound has healed Electronic Signature(s) Signed: 08/27/2021 1:35:44 PM By: Kalman Shan DO Entered By: Kalman Shan on 08/27/2021 13:32:06 [name] ([id number]) -------------------------------------------------------------------------------- Physician  Orders Details Patient Name: [name], [name]. Date of Service: 08/27/2021 12:30 PM Medical Record Number: [id number] Patient Account Number: [id number] Date of Birth/Sex: 1960-11-16 (60 y.o. M) Treating RN: Carlene Coria Primary Care Provider: Karl Ito Other Clinician: Referring Provider: Karl Ito Treating Provider/Extender: Yaakov Guthrie in Treatment: 3 Verbal / Phone Orders: No Diagnosis Coding Follow-up Appointments o Return Appointment in 1 week. Bathing/ Shower/ Hygiene o May shower; gently cleanse wound with  antibacterial soap, rinse and pat dry prior to dressing wounds Edema Control - Lymphedema / Segmental Compressive Device / Other o Elevate, Exercise Daily and Avoid Standing for Long Periods of Time. o Elevate legs to the level of the heart and pump ankles as often as possible o Elevate leg(s) parallel to the floor when sitting. Wound Treatment Wound #1 - Lower Leg Wound Laterality: Right, Posterior Cleanser: Byram Ancillary Kit - 15 Day Supply (Generic) 1 x Per Day/30 Days Discharge Instructions: Use supplies as instructed; Kit contains: (15) Saline Bullets; (15) 3x3 Gauze; 15 pr Gloves Cleanser: Soap and Water 1 x Per Day/30 Days Discharge Instructions: Gently cleanse wound with antibacterial soap, rinse and pat dry prior to dressing wounds Primary Dressing: Gauze (Generic) 1 x Per Day/30 Days Discharge Instructions: As directed: dry, moistened with saline or moistened with Dakins Solution Primary Dressing: Santyl Collagenase Ointment, 30 (gm), tube 1 x Per Day/30 Days Secondary Dressing: Zetuvit Plus Silicone Border Dressing 4x4 (in/in) (Generic) 1 x Per Day/30 Days Wound #2 - Lower Leg Wound Laterality: Right, Medial Cleanser: Byram Ancillary Kit - 15 Day Supply (Generic) 1 x Per Day/30 Days Discharge Instructions: Use supplies as instructed; Kit contains: (15) Saline Bullets; (15) 3x3 Gauze; 15 pr Gloves Cleanser: Soap and Water 1 x Per Day/30 Days Discharge Instructions: Gently cleanse wound with antibacterial soap, rinse and pat dry prior to dressing wounds Primary Dressing: Gauze (Generic) 1 x Per Day/30 Days Discharge Instructions: As directed: dry, moistened with saline or moistened with Dakins Solution Primary Dressing: Santyl Collagenase Ointment, 30 (gm), tube 1 x Per Day/30 Days Secondary Dressing: Kerlix 4.5 x 4.1 (in/yd) 1 x Per Day/30 Days Discharge Instructions: Apply Kerlix 4.5 x 4.1 (in/yd) as instructed Secondary Dressing: Zetuvit Plus Silicone Border Dressing 4x4  (in/in) (Generic) 1 x Per Day/30 Days Wound #5 - Lower Leg Wound Laterality: Left, Medial Cleanser: Byram Ancillary Kit - 15 Day Supply (Generic) 1 x Per Day/30 Days Discharge Instructions: Use supplies as instructed; Kit contains: (15) Saline Bullets; (15) 3x3 Gauze; 15 pr Gloves Cleanser: Soap and Water 1 x Per Day/30 Days Discharge Instructions: Gently cleanse wound with antibacterial soap, rinse and pat dry prior to dressing wounds Primary Dressing: Gauze (Generic) 1 x Per Day/30 Days Discharge Instructions: As directed: dry, moistened with saline or moistened with Dakins Solution Primary Dressing: Santyl Collagenase Ointment, 30 (gm), tube 1 x Per Day/30 Days JAYSUN, WESSELS ([id number]) Secondary Dressing: Zetuvit Plus Silicone Border Dressing 4x4 (in/in) (Generic) 1 x Per Day/30 Days Electronic Signature(s) Signed: 08/27/2021 1:29:01 PM By: Carlene Coria RN Signed: 08/27/2021 1:35:44 PM By: Kalman Shan DO Entered By: Carlene Coria on 08/27/2021 13:29:01 [name] ([id number]) -------------------------------------------------------------------------------- Problem List Details Patient Name: LARUE, LIGHTNER. Date of Service: 08/27/2021 12:30 PM Medical Record Number: [id number] Patient Account Number: [id number] Date of Birth/Sex: 1961-04-03 (60 y.o. M) Treating RN: Carlene Coria Primary Care Provider: Karl Ito Other Clinician: Referring Provider: Karl Ito Treating Provider/Extender: Yaakov Guthrie in Treatment: 3 Active Problems ICD-10 Encounter Code Description Active Date MDM Diagnosis  L97.919 Non-pressure chronic ulcer of unspecified part of right lower leg with 08/06/2021 No Yes unspecified severity L97.829 Non-pressure chronic ulcer of other part of left lower leg with 08/06/2021 No Yes unspecified severity I10 Essential (primary) hypertension 08/06/2021 No Yes J88.41 Chronic systolic (congestive) heart failure 08/06/2021 No Yes I25.119  Atherosclerotic heart disease of native coronary artery with unspecified 08/06/2021 No Yes angina pectoris Z95.1 Presence of aortocoronary bypass graft 08/06/2021 No Yes E11.622 Type 2 diabetes mellitus with other skin ulcer 08/27/2021 No Yes Inactive Problems Resolved Problems Electronic Signature(s) Signed: 08/27/2021 1:35:44 PM By: Kalman Shan DO Entered By: Kalman Shan on 08/27/2021 13:34:34 [name] ([id number]) -------------------------------------------------------------------------------- Progress Note Details Patient Name: [name]. Date of Service: 08/27/2021 12:30 PM Medical Record Number: [id number] Patient Account Number: [id number] Date of Birth/Sex: [birth year] (60 y.o. M) Treating RN: Carlene Coria Primary Care Provider: Karl Ito Other Clinician: Referring Provider: Karl Ito Treating Provider/Extender: Yaakov Guthrie in Treatment: 3 Subjective Chief Complaint Information obtained from Patient Bilateral lower extremity wounds History of Present Illness (HPI) Admission 08/06/2021 Mr. Mishawaka Wenzlick is a 60 year old male with a past medical history of uncontrolled insulin-dependent type 2 diabetes, current cigarette smoker, CABG, chronic systolic congestive heart failure and history of osteomyelitis of the left foot status post fifth digit amputation that presents to the clinic for a 2-week history of nonhealing wounds to his lower extremities bilaterally. He states that 1 evening he developed blisters on his legs that eventually popped and developed tightly adhered nonviable tissue. He was evaluated by his primary care office and they noted increased erythema to the periwound's and he was started on doxycycline. He is currently taking this and reports improvement in his symptoms. He denies pain. He denies drainage. 10/26; patient presents for follow-up. He was able to start [facility] and has been using this for the past week. He no longer  experiences pain or increased warmth or redness to the periwound's. He has no issues or complaints today. 11/2; patient presents for follow-up. He has been using Santyl daily. He has no issues or complaints today. He denies signs of infection. 11/9; patient presents for follow-up. He continues to use Santyl daily to the wound beds. He has no issues or complaints today. He denies signs of infection. Patient History Information obtained from Patient. Social History Former smoker, Marital Status - Separated, Alcohol Use - Never, Drug Use - No History, Caffeine Use - Daily. Medical History Cardiovascular Patient has history of Coronary Artery Disease, Hypertension, Myocardial Infarction Endocrine Patient has history of Type II Diabetes Objective Constitutional Vitals Time Taken: 12:40 PM, Height: 65 in, Weight: 180 lbs, BMI: 30, Temperature: 97.5 F, Pulse: 72 bpm, Respiratory Rate: 18 breaths/min, Blood Pressure: 141/75 mmHg. General Notes: Bilateral lower extremity wounds with nonviable tissue. Post debridement there is some granulation tissue present. No signs of infection. Posterior right lower extremity wound has healed Integumentary (Hair, Skin) Wound #1 status is Open. Original cause of wound was Gradually Appeared. The date acquired was: 07/19/2021. The wound has been in treatment 3 weeks. The wound is located on the Right,Posterior Lower Leg. The wound measures 2.5cm length x 2cm width x 0.1cm depth; 3.927cm^2 area and 0.393cm^3 volume. There is Fat Layer (Subcutaneous Tissue) exposed. There is no tunneling or undermining noted. There is a none present amount of drainage noted. There is small (1-33%) red granulation within the wound bed. There is a large (67-100%) amount of necrotic tissue within the wound bed including Adherent Slough. Wound #2 status  is Open. Original cause of wound was Gradually Appeared. The date acquired was: 07/19/2021. The wound has been in treatment 3 weeks.  The wound is located on the Right,Medial Lower Leg. The wound measures 1.2cm length x 1cm width x 0.1cm depth; 0.942cm^2 area [name], [name]. ([id number]) and 0.094cm^3 volume. There is Fat Layer (Subcutaneous Tissue) exposed. There is no tunneling or undermining noted. There is a medium amount of serosanguineous drainage noted. There is small (1-33%) red granulation within the wound bed. There is a large (67-100%) amount of necrotic tissue within the wound bed including Adherent Slough. Wound #3 status is Open. Original cause of wound was Gradually Appeared. The date acquired was: 07/19/2021. The wound has been in treatment 3 weeks. The wound is located on the Right,Distal Lower Leg. The wound measures 0cm length x 0cm width x 0cm depth; 0cm^2 area and 0cm^3 volume. There is no tunneling or undermining noted. There is a none present amount of drainage noted. There is no granulation within the wound bed. There is no necrotic tissue within the wound bed. Wound #5 status is Open. Original cause of wound was Gradually Appeared. The date acquired was: 07/19/2021. The wound has been in treatment 3 weeks. The wound is located on the Left,Medial Lower Leg. The wound measures 0.5cm length x 0.5cm width x 0.1cm depth; 0.196cm^2 area and 0.02cm^3 volume. There is Fat Layer (Subcutaneous Tissue) exposed. There is no tunneling or undermining noted. There is a medium amount of serosanguineous drainage noted. There is small (1-33%) red granulation within the wound bed. There is a large (67-100%) amount of necrotic tissue within the wound bed including Eschar. Assessment Active Problems ICD-10 Non-pressure chronic ulcer of unspecified part of right lower leg with unspecified severity Non-pressure chronic ulcer of other part of left lower leg with unspecified severity Essential (primary) hypertension Chronic systolic (congestive) heart failure Atherosclerotic heart disease of native coronary artery with  unspecified angina pectoris Presence of aortocoronary bypass graft Type 2 diabetes mellitus with other skin ulcer Patient's wounds have shown improvement in size and appearance since last clinic visit. The right posterior ankle wound has healed. I debrided nonviable tissue. I recommended continuing Santyl daily as this has done well. No signs of infection on exam. Follow-up in 1 week. Procedures Wound #1 Pre-procedure diagnosis of Wound #1 is a Diabetic Wound/Ulcer of the Lower Extremity located on the Right,Posterior Lower Leg .Severity of Tissue Pre Debridement is: Fat layer exposed. There was a Excisional Skin/Subcutaneous Tissue Debridement with a total area of 5 sq cm performed by Kalman Shan, MD. With the following instrument(s): Curette to remove Viable and Non-Viable tissue/material. Material removed includes Subcutaneous Tissue, Slough, Skin: Dermis, and Skin: Epidermis after achieving pain control using Lidocaine 4% Topical Solution. No specimens were taken. A time out was conducted at 13:00, prior to the start of the procedure. A Minimum amount of bleeding was controlled with Pressure. The procedure was tolerated well with a pain level of 0 throughout and a pain level of 0 following the procedure. Post Debridement Measurements: 2.5cm length x 2cm width x 0.1cm depth; 0.393cm^3 volume. Character of Wound/Ulcer Post Debridement is improved. Severity of Tissue Post Debridement is: Fat layer exposed. Post procedure Diagnosis Wound #1: Same as Pre-Procedure Wound #2 Pre-procedure diagnosis of Wound #2 is a Diabetic Wound/Ulcer of the Lower Extremity located on the Right,Medial Lower Leg .Severity of Tissue Pre Debridement is: Fat layer exposed. There was a Excisional Skin/Subcutaneous Tissue Debridement with a total area of 1.2 sq  cm performed by Kalman Shan, MD. With the following instrument(s): Curette to remove Viable and Non-Viable tissue/material. Material removed  includes Subcutaneous Tissue, Slough, Skin: Dermis, and Skin: Epidermis after achieving pain control using Lidocaine 4% Topical Solution. No specimens were taken. A time out was conducted at 13:00, prior to the start of the procedure. A Minimum amount of bleeding was controlled with Pressure. The procedure was tolerated well with a pain level of 0 throughout and a pain level of 0 following the procedure. Post Debridement Measurements: 1.2cm length x 1cm width x 0.1cm depth; 0.094cm^3 volume. Character of Wound/Ulcer Post Debridement is improved. Severity of Tissue Post Debridement is: Fat layer exposed. Post procedure Diagnosis Wound #2: Same as Pre-Procedure Wound #5 Pre-procedure diagnosis of Wound #5 is a Diabetic Wound/Ulcer of the Lower Extremity located on the Left,Medial Lower Leg .Severity of Tissue Pre Debridement is: Fat layer exposed. There was a Excisional Skin/Subcutaneous Tissue Debridement with a total area of 0.25 sq cm performed by Kalman Shan, MD. With the following instrument(s): Curette to remove Viable and Non-Viable tissue/material. Material removed includes Subcutaneous Tissue, Slough, Skin: Dermis, and Skin: Epidermis after achieving pain control using Lidocaine 4% Topical Solution. No specimens were taken. A time out was conducted at 13:00, prior to the start of the procedure. A Minimum amount of bleeding was controlled with Pressure. The procedure was tolerated well with a pain level of 0 throughout and a pain level of 0 following the procedure. Post Debridement Measurements: 0.5cm length x 0.5cm width x 0.1cm depth; 0.02cm^3 volume. Character of Wound/Ulcer Post Debridement is improved. Severity of Tissue Post Debridement is: Fat layer exposed. Post procedure Diagnosis Wound #5: Same as Pre-Procedure [name], [name] ([id number]) Plan Follow-up Appointments: Return Appointment in 1 week. Bathing/ Shower/ Hygiene: May shower; gently cleanse wound with antibacterial  soap, rinse and pat dry prior to dressing wounds Edema Control - Lymphedema / Segmental Compressive Device / Other: Elevate, Exercise Daily and Avoid Standing for Long Periods of Time. Elevate legs to the level of the heart and pump ankles as often as possible Elevate leg(s) parallel to the floor when sitting. WOUND #1: - Lower Leg Wound Laterality: Right, Posterior Cleanser: Byram Ancillary Kit - 15 Day Supply (Generic) 1 x Per Day/30 Days Discharge Instructions: Use supplies as instructed; Kit contains: (15) Saline Bullets; (15) 3x3 Gauze; 15 pr Gloves Cleanser: Soap and Water 1 x Per Day/30 Days Discharge Instructions: Gently cleanse wound with antibacterial soap, rinse and pat dry prior to dressing wounds Primary Dressing: Gauze (Generic) 1 x Per Day/30 Days Discharge Instructions: As directed: dry, moistened with saline or moistened with Dakins Solution Primary Dressing: Santyl Collagenase Ointment, 30 (gm), tube 1 x Per Day/30 Days Secondary Dressing: Zetuvit Plus Silicone Border Dressing 4x4 (in/in) (Generic) 1 x Per Day/30 Days WOUND #2: - Lower Leg Wound Laterality: Right, Medial Cleanser: Byram Ancillary Kit - 15 Day Supply (Generic) 1 x Per Day/30 Days Discharge Instructions: Use supplies as instructed; Kit contains: (15) Saline Bullets; (15) 3x3 Gauze; 15 pr Gloves Cleanser: Soap and Water 1 x Per Day/30 Days Discharge Instructions: Gently cleanse wound with antibacterial soap, rinse and pat dry prior to dressing wounds Primary Dressing: Gauze (Generic) 1 x Per Day/30 Days Discharge Instructions: As directed: dry, moistened with saline or moistened with Dakins Solution Primary Dressing: Santyl Collagenase Ointment, 30 (gm), tube 1 x Per Day/30 Days Secondary Dressing: Kerlix 4.5 x 4.1 (in/yd) 1 x Per Day/30 Days Discharge Instructions: Apply Kerlix 4.5 x 4.1 (in/yd)  as instructed Secondary Dressing: Zetuvit Plus Silicone Border Dressing 4x4 (in/in) (Generic) 1 x Per Day/30  Days WOUND #5: - Lower Leg Wound Laterality: Left, Medial Cleanser: [place] Ancillary Kit - 15 Day Supply (Generic) 1 x Per Day/30 Days Discharge Instructions: Use supplies as instructed; Kit contains: (15) Saline Bullets; (15) 3x3 Gauze; 15 pr Gloves Cleanser: Soap and Water 1 x Per Day/30 Days Discharge Instructions: Gently cleanse wound with antibacterial soap, rinse and pat dry prior to dressing wounds Primary Dressing: Gauze (Generic) 1 x Per Day/30 Days Discharge Instructions: As directed: dry, moistened with saline or moistened with Dakins Solution Primary Dressing: Santyl Collagenase Ointment, 30 (gm), tube 1 x Per Day/30 Days Secondary Dressing: Zetuvit Plus Silicone Border Dressing 4x4 (in/in) (Generic) 1 x Per Day/30 Days 1. In office sharp debridement 2. Santyl daily 3. Follow-up in 1 week Electronic Signature(s) Signed: 08/27/2021 1:35:44 PM By: Kalman Shan DO Entered By: Kalman Shan on 08/27/2021 13:34:53 [name] ([id number]) -------------------------------------------------------------------------------- ROS/PFSH Details Patient Name: [name], [name]. Date of Service: 08/27/2021 12:30 PM Medical Record Number: [id number] Patient Account Number: [id number] Date of Birth/Sex: 23-Mar-1961 (60 y.o. M) Treating RN: Carlene Coria Primary Care Provider: Karl Ito Other Clinician: Referring Provider: Karl Ito Treating Provider/Extender: Yaakov Guthrie in Treatment: 3 Information Obtained From Patient Cardiovascular Medical History: Positive for: Coronary Artery Disease; Hypertension; Myocardial Infarction Endocrine Medical History: Positive for: Type II Diabetes Time with diabetes: 7 years Treated with: Oral agents Blood sugar tested every day: Yes Tested : Immunizations Pneumococcal Vaccine: Received Pneumococcal Vaccination: No Implantable Devices None Family and Social History Former smoker; Marital Status - Separated; Alcohol Use:  Never; Drug Use: No History; Caffeine Use: Daily; Financial Concerns: No; Food, Clothing or Shelter Needs: No; Support System Lacking: No; Transportation Concerns: No Electronic Signature(s) Signed: 08/27/2021 1:35:44 PM By: Kalman Shan DO Signed: 08/29/2021 3:56:47 PM By: Carlene Coria RN Entered By: Kalman Shan on 08/27/2021 13:31:17 [name], [name] ([id number]) -------------------------------------------------------------------------------- [facility] Details Patient Name: [name], [name]. Date of Service: 08/27/2021 Medical Record Number: [id number] Patient Account Number: [id number] Date of Birth/Sex: [birth year] (60 y.o. 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 L97.829 Non-pressure chronic ulcer of other part of left lower leg with unspecified severity I10 Essential (primary) hypertension G81.15 Chronic systolic (congestive) heart failure I25.119 Atherosclerotic heart disease of native coronary artery with unspecified angina pectoris Z95.1 Presence of aortocoronary bypass graft E11.622 Type 2 diabetes mellitus with other skin ulcer Facility Procedures CPT4 Code: [id number] Description: 97416 - DEB SUBQ TISSUE 20 SQ CM/< Modifier: Quantity: 1 CPT4 Code: Description: ICD-10 Diagnosis Description L97.919 Non-pressure chronic ulcer of unspecified part of right lower leg with unspec L97.829 Non-pressure chronic ulcer of other part of left lower leg with unspecified s E11.622 Type 2 diabetes mellitus with  other skin ulcer Modifier: ified severity everity Quantity: Physician Procedures CPT4 Code: 3845364 Description: 11042 - WC PHYS SUBQ TISS 20 SQ CM Modifier: Quantity: 1 CPT4  Code: Description: ICD-10 Diagnosis Description L97.919 Non-pressure chronic ulcer of unspecified part of right lower leg with unspec L97.829 Non-pressure chronic ulcer of other part of left lower leg with unspecified s E11.622 Type 2 diabetes mellitus with  other skin ulcer Modifier: ified severity everity Quantity: Electronic Signature(s) Signed: 08/27/2021 1:35:44 PM By: Kalman Shan DO Entered By:  Kalman Shan on 08/27/2021 13:35:12

## 2021-09-03 ENCOUNTER — Other Ambulatory Visit: Payer: Self-pay

## 2021-09-03 ENCOUNTER — Encounter (HOSPITAL_BASED_OUTPATIENT_CLINIC_OR_DEPARTMENT_OTHER): Payer: Medicare Other | Admitting: Internal Medicine

## 2021-09-03 DIAGNOSIS — L97829 Non-pressure chronic ulcer of other part of left lower leg with unspecified severity: Secondary | ICD-10-CM | POA: Diagnosis not present

## 2021-09-03 DIAGNOSIS — E11622 Type 2 diabetes mellitus with other skin ulcer: Secondary | ICD-10-CM | POA: Diagnosis not present

## 2021-09-03 DIAGNOSIS — I5022 Chronic systolic (congestive) heart failure: Secondary | ICD-10-CM

## 2021-09-03 DIAGNOSIS — L97919 Non-pressure chronic ulcer of unspecified part of right lower leg with unspecified severity: Secondary | ICD-10-CM | POA: Diagnosis not present

## 2021-09-05 NOTE — Progress Notes (Signed)
[name] ([id number]) Visit Report for 09/03/2021 Chief Complaint Document Details Patient Name: [name], [name]. Date of Service: 09/03/2021 12:30 PM Medical Record Number: [id number] Patient Account Number: [id number] Date of Birth/Sex: Sep 10, 1961 (60 y.o. M) Treating RN: Carlene Coria Primary Care Provider: Karl Ito Other Clinician: Referring Provider: Karl Ito Treating Provider/Extender: Yaakov Guthrie in Treatment: 4 Information Obtained from: Patient Chief Complaint Bilateral lower extremity wounds Electronic Signature(s) Signed: 09/03/2021 1:11:55 PM By: Kalman Shan DO Entered By: Kalman Shan on 09/03/2021 13:08:51 [name] ([id number]) -------------------------------------------------------------------------------- Debridement Details Patient Name: [name]. Date of Service: 09/03/2021 12:30 PM Medical Record Number: [id number] Patient Account Number: [id number] Date of Birth/Sex: [birth year] (60 y.o. M) Treating RN: Carlene Coria Primary Care Provider: Karl Ito Other Clinician: Referring Provider: Karl Ito Treating Provider/Extender: Yaakov Guthrie in Treatment: 4 Debridement Performed for Wound #2 Right,Medial Lower Leg Assessment: Performed By: Physician Kalman Shan, MD Debridement Type: Debridement Severity of Tissue Pre Debridement: Fat layer exposed Level of Consciousness (Pre- Awake and Alert procedure): Pre-procedure Verification/Time Out Yes - 13:03 Taken: Start Time: 13:03 Total Area Debrided (L x W): 1 (cm) x 0.7 (cm) = 0.7 (cm) Tissue and other material Viable, Non-Viable, Slough, Subcutaneous, Skin: Dermis , Skin: Epidermis, Slough debrided: Level: Skin/Subcutaneous Tissue Debridement Description: Excisional Instrument: Curette Bleeding: Minimum Hemostasis Achieved: Pressure End Time: 13:06 Procedural Pain: 0 Post Procedural Pain: 0 Response to Treatment: Procedure was tolerated  well Level of Consciousness (Post- Awake and Alert procedure): Post Debridement Measurements of Total Wound Length: (cm) 1 Width: (cm) 0.7 Depth: (cm) 0.1 Volume: (cm) 0.055 Character of Wound/Ulcer Post Debridement: Improved Severity of Tissue Post Debridement: Fat layer exposed Post Procedure Diagnosis Same as Pre-procedure Electronic Signature(s) Signed: 09/03/2021 1:11:55 PM By: Kalman Shan DO Signed: 09/05/2021 10:13:49 AM By: Carlene Coria RN Entered By: Carlene Coria on 09/03/2021 13:04:19 [name] ([id number]) -------------------------------------------------------------------------------- Debridement Details Patient Name: [name]. Date of Service: 09/03/2021 12:30 PM Medical Record Number: [id number] Patient Account Number: [id number] Date of Birth/Sex: April 22, 1961 (60 y.o. M) Treating RN: Carlene Coria Primary Care Provider: Karl Ito Other Clinician: Referring Provider: Karl Ito Treating Provider/Extender: Yaakov Guthrie in Treatment: 4 Debridement Performed for Wound #1 Right,Posterior Lower Leg Assessment: Performed By: Physician Kalman Shan, MD Debridement Type: Debridement Severity of Tissue Pre Debridement: Fat layer exposed Level of Consciousness (Pre- Awake and Alert procedure): Pre-procedure Verification/Time Out Yes - 13:03 Taken: Start Time: 13:03 Total Area Debrided (L x W): 1.7 (cm) x 1.7 (cm) = 2.89 (cm) Tissue and other material Viable, Non-Viable, Slough, Subcutaneous, Skin: Dermis , Skin: Epidermis, Slough debrided: Level: Skin/Subcutaneous Tissue Debridement Description: Excisional Instrument: Curette Bleeding: Minimum Hemostasis Achieved: Pressure End Time: 13:06 Procedural Pain: 0 Post Procedural Pain: 0 Response to Treatment: Procedure was tolerated well Level of Consciousness (Post- Awake and Alert procedure): Post Debridement Measurements of Total Wound Length: (cm) 1.7 Width: (cm)  1.7 Depth: (cm) 0.1 Volume: (cm) 0.227 Character of Wound/Ulcer Post Debridement: Improved Severity of Tissue Post Debridement: Fat layer exposed Post Procedure Diagnosis Same as Pre-procedure Electronic Signature(s) Signed: 09/03/2021 1:11:55 PM By: Kalman Shan DO Signed: 09/05/2021 10:13:49 AM By: Carlene Coria RN Entered By: Carlene Coria on 09/03/2021 13:05:20 [name] ([id number]) -------------------------------------------------------------------------------- Debridement Details Patient Name: [name]. Date of Service: 09/03/2021 12:30 PM Medical Record Number: [id number] Patient Account Number: [id number] Date of Birth/Sex: [birth year] (60 y.o. M) Treating RN: Carlene Coria Primary Care Provider: Karl Ito Other Clinician: Referring Provider: Karl Ito Treating Provider/Extender:  Kalman Shan Weeks in Treatment: 4 Debridement Performed for Wound #5 Left,Medial Lower Leg Assessment: Performed By: Physician Kalman Shan, MD Debridement Type: Debridement Severity of Tissue Pre Debridement: Fat layer exposed Level of Consciousness (Pre- Awake and Alert procedure): Pre-procedure Verification/Time Out Yes - 13:03 Taken: Start Time: 13:03 Total Area Debrided (L x W): 0.3 (cm) x 0.5 (cm) = 0.15 (cm) Tissue and other material Viable, Non-Viable, Slough, Subcutaneous, Skin: Dermis , Skin: Epidermis, Slough debrided: Level: Skin/Subcutaneous Tissue Debridement Description: Excisional Instrument: Curette Bleeding: Minimum Hemostasis Achieved: Pressure End Time: 13:06 Procedural Pain: 0 Post Procedural Pain: 0 Response to Treatment: Procedure was tolerated well Level of Consciousness (Post- Awake and Alert procedure): Post Debridement Measurements of Total Wound Length: (cm) 0.3 Width: (cm) 0.5 Depth: (cm) 0.1 Volume: (cm) 0.012 Character of Wound/Ulcer Post Debridement: Improved Severity of Tissue Post Debridement: Fat layer  exposed Post Procedure Diagnosis Same as Pre-procedure Electronic Signature(s) Signed: 09/03/2021 1:11:55 PM By: Kalman Shan DO Signed: 09/05/2021 10:13:49 AM By: Carlene Coria RN Entered By: Carlene Coria on 09/03/2021 13:06:29 [name] ([id number]) -------------------------------------------------------------------------------- HPI Details Patient Name: [name], [name]. Date of Service: 09/03/2021 12:30 PM Medical Record Number: [id number] Patient Account Number: [id number] Date of Birth/Sex: [birth year] (60 y.o. M) Treating RN: Carlene Coria Primary Care Provider: Karl Ito Other Clinician: Referring Provider: Karl Ito Treating Provider/Extender: Yaakov Guthrie in Treatment: 4 History of Present Illness HPI Description: Admission 08/06/2021 Mr. Shane Badeaux is a 60 year old male with a past medical history of uncontrolled insulin-dependent type 2 diabetes, current cigarette smoker, CABG, chronic systolic congestive heart failure and history of osteomyelitis of the left foot status post fifth digit amputation that presents to the clinic for a 2-week history of nonhealing wounds to his lower extremities bilaterally. He states that 1 evening he developed blisters on his legs that eventually popped and developed tightly adhered nonviable tissue. He was evaluated by his primary care office and they noted increased erythema to the periwound's and he was started on doxycycline. He is currently taking this and reports improvement in his symptoms. He denies pain. He denies drainage. 10/26; patient presents for follow-up. He was able to start [facility] and has been using this for the past week. He no longer experiences pain or increased warmth or redness to the periwound's. He has no issues or complaints today. 11/2; patient presents for follow-up. He has been using Santyl daily. He has no issues or complaints today. He denies signs of infection. 11/9; patient presents for  follow-up. He continues to use Santyl daily to the wound beds. He has no issues or complaints today. He denies signs of infection. 11/16; patient presents for follow-up. He has no issues or complaints today. He continues to use Santyl. He denies signs of infection. Electronic Signature(s) Signed: 09/03/2021 1:11:55 PM By: Kalman Shan DO Entered By: Kalman Shan on 09/03/2021 13:09:14 [name] ([id number]) -------------------------------------------------------------------------------- Physical Exam Details Patient Name: RABON, SCHOLLE. Date of Service: 09/03/2021 12:30 PM Medical Record Number: [id number] Patient Account Number: [id number] Date of Birth/Sex: 04/05/1961 (60 y.o. M) Treating RN: Carlene Coria Primary Care Provider: Karl Ito Other Clinician: Referring Provider: Karl Ito Treating Provider/Extender: Yaakov Guthrie in Treatment: 4 Constitutional . Cardiovascular . Psychiatric . Notes Bilateral lower extremity wounds with granulation tissue and nonviable tissue present. No surrounding signs of infection. Electronic Signature(s) Signed: 09/03/2021 1:11:55 PM By: Kalman Shan DO Entered By: Kalman Shan on 09/03/2021 13:10:17 [name] ([id number]) -------------------------------------------------------------------------------- Physician Orders Details Patient Name: MICHARL, HELMES  F. Date of Service: 09/03/2021 12:30 PM Medical Record Number: [id number] Patient Account Number: [id number] Date of Birth/Sex: 05-Dec-1960 (60 y.o. M) Treating RN: Carlene Coria Primary Care Provider: Karl Ito Other Clinician: Referring Provider: Karl Ito Treating Provider/Extender: Yaakov Guthrie in Treatment: 4 Verbal / Phone Orders: No Diagnosis Coding Follow-up Appointments o Return Appointment in 1 week. Bathing/ Shower/ Hygiene o May shower; gently cleanse wound with antibacterial soap, rinse and pat dry prior to dressing  wounds Edema Control - Lymphedema / Segmental Compressive Device / Other o Elevate, Exercise Daily and Avoid Standing for Long Periods of Time. o Elevate legs to the level of the heart and pump ankles as often as possible o Elevate leg(s) parallel to the floor when sitting. Wound Treatment Wound #1 - Lower Leg Wound Laterality: Right, Posterior Cleanser: Byram Ancillary Kit - 15 Day Supply (Generic) 1 x Per Day/30 Days Discharge Instructions: Use supplies as instructed; Kit contains: (15) Saline Bullets; (15) 3x3 Gauze; 15 pr Gloves Cleanser: Soap and Water 1 x Per Day/30 Days Discharge Instructions: Gently cleanse wound with antibacterial soap, rinse and pat dry prior to dressing wounds Primary Dressing: Gauze (Generic) 1 x Per Day/30 Days Discharge Instructions: As directed: dry, moistened with saline or moistened with Dakins Solution Primary Dressing: Santyl Collagenase Ointment, 30 (gm), tube 1 x Per Day/30 Days Secondary Dressing: Zetuvit Plus Silicone Border Dressing 4x4 (in/in) (Generic) 1 x Per Day/30 Days Wound #2 - Lower Leg Wound Laterality: Right, Medial Cleanser: Byram Ancillary Kit - 15 Day Supply (Generic) 1 x Per Day/30 Days Discharge Instructions: Use supplies as instructed; Kit contains: (15) Saline Bullets; (15) 3x3 Gauze; 15 pr Gloves Cleanser: Soap and Water 1 x Per Day/30 Days Discharge Instructions: Gently cleanse wound with antibacterial soap, rinse and pat dry prior to dressing wounds Primary Dressing: Gauze (Generic) 1 x Per Day/30 Days Discharge Instructions: As directed: dry, moistened with saline or moistened with Dakins Solution Primary Dressing: Santyl Collagenase Ointment, 30 (gm), tube 1 x Per Day/30 Days Secondary Dressing: Kerlix 4.5 x 4.1 (in/yd) 1 x Per Day/30 Days Discharge Instructions: Apply Kerlix 4.5 x 4.1 (in/yd) as instructed Secondary Dressing: Zetuvit Plus Silicone Border Dressing 4x4 (in/in) (Generic) 1 x Per Day/30 Days Wound #5 - Lower  Leg Wound Laterality: Left, Medial Cleanser: Byram Ancillary Kit - 15 Day Supply (Generic) 1 x Per Day/30 Days Discharge Instructions: Use supplies as instructed; Kit contains: (15) Saline Bullets; (15) 3x3 Gauze; 15 pr Gloves Cleanser: Soap and Water 1 x Per Day/30 Days Discharge Instructions: Gently cleanse wound with antibacterial soap, rinse and pat dry prior to dressing wounds Primary Dressing: Gauze (Generic) 1 x Per Day/30 Days Discharge Instructions: As directed: dry, moistened with saline or moistened with Dakins Solution Primary Dressing: Santyl Collagenase Ointment, 30 (gm), tube 1 x Per Day/30 Days WYN, NETTLE ([id number]) Secondary Dressing: Zetuvit Plus Silicone Border Dressing 4x4 (in/in) (Generic) 1 x Per Day/30 Days Electronic Signature(s) Signed: 09/03/2021 1:11:55 PM By: Kalman Shan DO Signed: 09/05/2021 10:13:49 AM By: Carlene Coria RN Entered By: Carlene Coria on 09/03/2021 13:06:56 [name] ([id number]) -------------------------------------------------------------------------------- Problem List Details Patient Name: KEELAN, TRIPODI. Date of Service: 09/03/2021 12:30 PM Medical Record Number: [id number] Patient Account Number: [id number] Date of Birth/Sex: 1961-07-01 (60 y.o. M) Treating RN: Carlene Coria Primary Care Provider: Karl Ito Other Clinician: Referring Provider: Karl Ito Treating Provider/Extender: Yaakov Guthrie in Treatment: 4 Active Problems ICD-10 Encounter Code Description Active Date MDM Diagnosis L97.919 Non-pressure chronic ulcer of unspecified  part of right lower leg with 08/06/2021 No Yes unspecified severity L97.829 Non-pressure chronic ulcer of other part of left lower leg with 08/06/2021 No Yes unspecified severity I10 Essential (primary) hypertension 08/06/2021 No Yes Q59.56 Chronic systolic (congestive) heart failure 08/06/2021 No Yes I25.119 Atherosclerotic heart disease of native coronary artery with  unspecified 08/06/2021 No Yes angina pectoris Z95.1 Presence of aortocoronary bypass graft 08/06/2021 No Yes E11.622 Type 2 diabetes mellitus with other skin ulcer 08/27/2021 No Yes Inactive Problems Resolved Problems Electronic Signature(s) Signed: 09/03/2021 1:11:55 PM By: Kalman Shan DO Entered By: Kalman Shan on 09/03/2021 13:08:29 [name] ([id number]) -------------------------------------------------------------------------------- Progress Note Details Patient Name: [name] Date of Service: 09/03/2021 12:30 PM Medical Record Number: [id number] Patient Account Number: [id number] Date of Birth/Sex: April 09, 1961 (60 y.o. M) Treating RN: Carlene Coria Primary Care Provider: Karl Ito Other Clinician: Referring Provider: Karl Ito Treating Provider/Extender: Yaakov Guthrie in Treatment: 4 Subjective Chief Complaint Information obtained from Patient Bilateral lower extremity wounds History of Present Illness (HPI) Admission 08/06/2021 Mr. Aayush Gelpi is a 60 year old male with a past medical history of uncontrolled insulin-dependent type 2 diabetes, current cigarette smoker, CABG, chronic systolic congestive heart failure and history of osteomyelitis of the left foot status post fifth digit amputation that presents to the clinic for a 2-week history of nonhealing wounds to his lower extremities bilaterally. He states that 1 evening he developed blisters on his legs that eventually popped and developed tightly adhered nonviable tissue. He was evaluated by his primary care office and they noted increased erythema to the periwound's and he was started on doxycycline. He is currently taking this and reports improvement in his symptoms. He denies pain. He denies drainage. 10/26; patient presents for follow-up. He was able to start [facility] and has been using this for the past week. He no longer experiences pain or increased warmth or redness to the  periwound's. He has no issues or complaints today. 11/2; patient presents for follow-up. He has been using Santyl daily. He has no issues or complaints today. He denies signs of infection. 11/9; patient presents for follow-up. He continues to use Santyl daily to the wound beds. He has no issues or complaints today. He denies signs of infection. 11/16; patient presents for follow-up. He has no issues or complaints today. He continues to use Santyl. He denies signs of infection. Patient History Information obtained from Patient. Social History Former smoker, Marital Status - Separated, Alcohol Use - Never, Drug Use - No History, Caffeine Use - Daily. Medical History Cardiovascular Patient has history of Coronary Artery Disease, Hypertension, Myocardial Infarction Endocrine Patient has history of Type II Diabetes Objective Constitutional Vitals Time Taken: 12:40 PM, Height: 65 in, Weight: 180 lbs, BMI: 30, Temperature: 98.1 F, Pulse: 79 bpm, Respiratory Rate: 18 breaths/min, Blood Pressure: 123/72 mmHg. General Notes: Bilateral lower extremity wounds with granulation tissue and nonviable tissue present. No surrounding signs of infection. Integumentary (Hair, Skin) Wound #1 status is Open. Original cause of wound was Gradually Appeared. The date acquired was: 07/19/2021. The wound has been in treatment 4 weeks. The wound is located on the Right,Posterior Lower Leg. The wound measures 1.7cm length x 1.7cm width x 0.1cm depth; 2.27cm^2 area and 0.227cm^3 volume. There is Fat Layer (Subcutaneous Tissue) exposed. There is no tunneling or undermining noted. There is a medium amount of serosanguineous drainage noted. There is small (1-33%) red granulation within the wound bed. There is a large (67-100%) amount of necrotic tissue within the wound bed  including [place]. KEARY, WATERSON ([id number]) Wound #2 status is Open. Original cause of wound was Gradually Appeared. The date acquired was:  07/19/2021. The wound has been in treatment 4 weeks. The wound is located on the Right,Medial Lower Leg. The wound measures 1cm length x 0.7cm width x 0.1cm depth; 0.55cm^2 area and 0.055cm^3 volume. There is Fat Layer (Subcutaneous Tissue) exposed. There is no tunneling or undermining noted. There is a medium amount of serosanguineous drainage noted. There is small (1-33%) red granulation within the wound bed. There is a large (67-100%) amount of necrotic tissue within the wound bed including Adherent Slough. Wound #5 status is Open. Original cause of wound was Gradually Appeared. The date acquired was: 07/19/2021. The wound has been in treatment 4 weeks. The wound is located on the Left,Medial Lower Leg. The wound measures 0.3cm length x 0.5cm width x 0.1cm depth; 0.118cm^2 area and 0.012cm^3 volume. There is Fat Layer (Subcutaneous Tissue) exposed. There is no tunneling or undermining noted. There is a medium amount of serosanguineous drainage noted. There is small (1-33%) red granulation within the wound bed. There is a large (67-100%) amount of necrotic tissue within the wound bed including [place]. Assessment Active Problems ICD-10 Non-pressure chronic ulcer of unspecified part of right lower leg with unspecified severity Non-pressure chronic ulcer of other part of left lower leg with unspecified severity Essential (primary) hypertension Chronic systolic (congestive) heart failure Atherosclerotic heart disease of native coronary artery with unspecified angina pectoris Presence of aortocoronary bypass graft Type 2 diabetes mellitus with other skin ulcer Patient's wounds have shown improvement in size and appearance since last clinic visit. I debrided nonviable tissue. I recommended continuing Santyl. Wound beds appear well-healing. Procedures Wound #1 Pre-procedure diagnosis of Wound #1 is a Diabetic Wound/Ulcer of the Lower Extremity located on the Right,Posterior Lower Leg .Severity  of Tissue Pre Debridement is: Fat layer exposed. There was a Excisional Skin/Subcutaneous Tissue Debridement with a total area of 2.89 sq cm performed by Kalman Shan, MD. With the following instrument(s): Curette to remove Viable and Non-Viable tissue/material. Material removed includes Subcutaneous Tissue, Slough, Skin: Dermis, and Skin: Epidermis. No specimens were taken. A time out was conducted at 13:03, prior to the start of the procedure. A Minimum amount of bleeding was controlled with Pressure. The procedure was tolerated well with a pain level of 0 throughout and a pain level of 0 following the procedure. Post Debridement Measurements: 1.7cm length x 1.7cm width x 0.1cm depth; 0.227cm^3 volume. Character of Wound/Ulcer Post Debridement is improved. Severity of Tissue Post Debridement is: Fat layer exposed. Post procedure Diagnosis Wound #1: Same as Pre-Procedure Wound #2 Pre-procedure diagnosis of Wound #2 is a Diabetic Wound/Ulcer of the Lower Extremity located on the Right,Medial Lower Leg .Severity of Tissue Pre Debridement is: Fat layer exposed. There was a Excisional Skin/Subcutaneous Tissue Debridement with a total area of 0.7 sq cm performed by Kalman Shan, MD. With the following instrument(s): Curette to remove Viable and Non-Viable tissue/material. Material removed includes Subcutaneous Tissue, Slough, Skin: Dermis, and Skin: Epidermis. No specimens were taken. A time out was conducted at 13:03, prior to the start of the procedure. A Minimum amount of bleeding was controlled with Pressure. The procedure was tolerated well with a pain level of 0 throughout and a pain level of 0 following the procedure. Post Debridement Measurements: 1cm length x 0.7cm width x 0.1cm depth; 0.055cm^3 volume. Character of Wound/Ulcer Post Debridement is improved. Severity of Tissue Post Debridement is: Fat layer exposed.  Post procedure Diagnosis Wound #2: Same as Pre-Procedure Wound  #5 Pre-procedure diagnosis of Wound #5 is a Diabetic Wound/Ulcer of the Lower Extremity located on the Left,Medial Lower Leg .Severity of Tissue Pre Debridement is: Fat layer exposed. There was a Excisional Skin/Subcutaneous Tissue Debridement with a total area of 0.15 sq cm performed by Kalman Shan, MD. With the following instrument(s): Curette to remove Viable and Non-Viable tissue/material. Material removed includes Subcutaneous Tissue, Slough, Skin: Dermis, and Skin: Epidermis. No specimens were taken. A time out was conducted at 13:03, prior to the start of the procedure. A Minimum amount of bleeding was controlled with Pressure. The procedure was tolerated well with a pain level of 0 throughout and a pain level of 0 following the procedure. Post Debridement Measurements: 0.3cm length x 0.5cm width x 0.1cm depth; 0.012cm^3 volume. Character of Wound/Ulcer Post Debridement is improved. Severity of Tissue Post Debridement is: Fat layer exposed. Post procedure Diagnosis Wound #5: Same as Pre-Procedure WYLDER, MACOMBER ([id number]) Plan Follow-up Appointments: Return Appointment in 1 week. Bathing/ Shower/ Hygiene: May shower; gently cleanse wound with antibacterial soap, rinse and pat dry prior to dressing wounds Edema Control - Lymphedema / Segmental Compressive Device / Other: Elevate, Exercise Daily and Avoid Standing for Long Periods of Time. Elevate legs to the level of the heart and pump ankles as often as possible Elevate leg(s) parallel to the floor when sitting. WOUND #1: - Lower Leg Wound Laterality: Right, Posterior Cleanser: Byram Ancillary Kit - 15 Day Supply (Generic) 1 x Per Day/30 Days Discharge Instructions: Use supplies as instructed; Kit contains: (15) Saline Bullets; (15) 3x3 Gauze; 15 pr Gloves Cleanser: Soap and Water 1 x Per Day/30 Days Discharge Instructions: Gently cleanse wound with antibacterial soap, rinse and pat dry prior to dressing wounds Primary  Dressing: Gauze (Generic) 1 x Per Day/30 Days Discharge Instructions: As directed: dry, moistened with saline or moistened with Dakins Solution Primary Dressing: Santyl Collagenase Ointment, 30 (gm), tube 1 x Per Day/30 Days Secondary Dressing: Zetuvit Plus Silicone Border Dressing 4x4 (in/in) (Generic) 1 x Per Day/30 Days WOUND #2: - Lower Leg Wound Laterality: Right, Medial Cleanser: Byram Ancillary Kit - 15 Day Supply (Generic) 1 x Per Day/30 Days Discharge Instructions: Use supplies as instructed; Kit contains: (15) Saline Bullets; (15) 3x3 Gauze; 15 pr Gloves Cleanser: Soap and Water 1 x Per Day/30 Days Discharge Instructions: Gently cleanse wound with antibacterial soap, rinse and pat dry prior to dressing wounds Primary Dressing: Gauze (Generic) 1 x Per Day/30 Days Discharge Instructions: As directed: dry, moistened with saline or moistened with Dakins Solution Primary Dressing: Santyl Collagenase Ointment, 30 (gm), tube 1 x Per Day/30 Days Secondary Dressing: Kerlix 4.5 x 4.1 (in/yd) 1 x Per Day/30 Days Discharge Instructions: Apply Kerlix 4.5 x 4.1 (in/yd) as instructed Secondary Dressing: Zetuvit Plus Silicone Border Dressing 4x4 (in/in) (Generic) 1 x Per Day/30 Days WOUND #5: - Lower Leg Wound Laterality: Left, Medial Cleanser: Byram Ancillary Kit - 15 Day Supply (Generic) 1 x Per Day/30 Days Discharge Instructions: Use supplies as instructed; Kit contains: (15) Saline Bullets; (15) 3x3 Gauze; 15 pr Gloves Cleanser: Soap and Water 1 x Per Day/30 Days Discharge Instructions: Gently cleanse wound with antibacterial soap, rinse and pat dry prior to dressing wounds Primary Dressing: Gauze (Generic) 1 x Per Day/30 Days Discharge Instructions: As directed: dry, moistened with saline or moistened with Dakins Solution Primary Dressing: Santyl Collagenase Ointment, 30 (gm), tube 1 x Per Day/30 Days Secondary Dressing: Zetuvit Plus Silicone Border  Dressing 4x4 (in/in) (Generic) 1 x Per  Day/30 Days 1. In office sharp debridement 2. Santyl 3. Follow-up in 1 week Electronic Signature(s) Signed: 09/03/2021 1:11:55 PM By: Kalman Shan DO Entered By: Kalman Shan on 09/03/2021 13:11:11 [name] ([id number]) -------------------------------------------------------------------------------- ROS/PFSH Details Patient Name: COSMO, TETREAULT. Date of Service: 09/03/2021 12:30 PM Medical Record Number: [id number] Patient Account Number: [id number] Date of Birth/Sex: 1960/11/22 (60 y.o. M) Treating RN: Carlene Coria Primary Care Provider: Karl Ito Other Clinician: Referring Provider: Karl Ito Treating Provider/Extender: Yaakov Guthrie in Treatment: 4 Information Obtained From Patient Cardiovascular Medical History: Positive for: Coronary Artery Disease; Hypertension; Myocardial Infarction Endocrine Medical History: Positive for: Type II Diabetes Time with diabetes: 7 years Treated with: Oral agents Blood sugar tested every day: Yes Tested : Immunizations Pneumococcal Vaccine: Received Pneumococcal Vaccination: No Implantable Devices None Family and Social History Former smoker; Marital Status - Separated; Alcohol Use: Never; Drug Use: No History; Caffeine Use: Daily; Financial Concerns: No; Food, Clothing or Shelter Needs: No; Support System Lacking: No; Transportation Concerns: No Electronic Signature(s) Signed: 09/03/2021 1:11:55 PM By: Kalman Shan DO Signed: 09/05/2021 10:13:49 AM By: Carlene Coria RN Entered By: Kalman Shan on 09/03/2021 13:09:21 DEMETRIOS, BYRON ([id number]) -------------------------------------------------------------------------------- [place] Details Patient Name: COY, ROCHFORD. Date of Service: 09/03/2021 Medical Record Number: [id number] Patient Account Number: [id number] Date of Birth/Sex: 1961/01/16 (60 y.o. 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 E11.622 Type 2 diabetes mellitus with other skin ulcer Facility Procedures CPT4 Code: 43735789 Description: 78478 - DEB SUBQ TISSUE 20 SQ CM/< Modifier: Quantity: 1 CPT4 Code: Description: ICD-10 Diagnosis Description L97.919 Non-pressure chronic ulcer of unspecified part of right lower leg with unspec L97.829 Non-pressure chronic ulcer of other part of left lower leg with unspecified s E11.622 Type 2 diabetes mellitus with  other skin ulcer S12.82 Chronic systolic (congestive) heart failure Modifier: ified severity everity Quantity: Physician Procedures CPT4 Code: 0813887 Description: 11042 - WC PHYS SUBQ TISS 20 SQ CM Modifier: Quantity: 1 CPT4 Code: Description: ICD-10 Diagnosis Description L97.919 Non-pressure chronic ulcer of unspecified part of right lower leg with unspec L97.829 Non-pressure chronic ulcer of other part of left lower leg with unspecified s E11.622 Type 2 diabetes mellitus with  other skin ulcer J95.97 Chronic systolic (congestive) heart failure Modifier: ified severity everity Quantity: Electronic Signature(s) Signed: 09/03/2021 1:11:55 PM By: Kalman Shan DO Entered By: Kalman Shan on 09/03/2021 13:11:34

## 2021-09-05 NOTE — Progress Notes (Signed)
[name], [name] ([id number]) Visit Report for 09/03/2021 Arrival Information Details Patient Name: [name], [name]. Date of Service: 09/03/2021 12:30 PM Medical Record Number: [id number] Patient Account Number: [id number] Date of Birth/Sex: Oct 15, 1961 (60 y.o. M) Treating RN: Carlene Coria Primary Care Dejaun Vidrio: Karl Ito Other Clinician: Referring Ruchama Kubicek: Karl Ito Treating Andrue Dini/Extender: Yaakov Guthrie in Treatment: 4 Visit Information History Since Last Visit All ordered tests and consults were completed: No Patient Arrived: Ambulatory Added or deleted any medications: No Arrival Time: 12:36 Any new allergies or adverse reactions: No Accompanied By: self Had a fall or experienced change in No Transfer Assistance: None activities of daily living that may affect Patient Identification Verified: Yes risk of falls: Secondary Verification Process Completed: Yes Signs or symptoms of abuse/neglect since last visito No Patient Requires Transmission-Based Precautions: No Hospitalized since last visit: No Patient Has Alerts: No Implantable device outside of the clinic excluding No cellular tissue based products placed in the center since last visit: Has Dressing in Place as Prescribed: Yes Pain Present Now: No Electronic Signature(s) Signed: 09/05/2021 10:13:49 AM By: Carlene Coria RN Entered By: Carlene Coria on 09/03/2021 12:40:56 [name] ([id number]) -------------------------------------------------------------------------------- Clinic Level of Care Assessment Details Patient Name: [name], [name]. Date of Service: 09/03/2021 12:30 PM Medical Record Number: [id number] Patient Account Number: [id number] Date of Birth/Sex: [birth year] (60 y.o. M) Treating RN: Carlene Coria Primary Care Kamal Jurgens: Karl Ito Other Clinician: Referring Livan Hires: Karl Ito Treating Shanae Luo/Extender: Yaakov Guthrie in Treatment: 4 Clinic Level of Care Assessment  Items TOOL 1 Quantity Score '[]'  - Use when EandM and Procedure is performed on INITIAL visit 0 ASSESSMENTS - Nursing Assessment / Reassessment '[]'  - General Physical Exam (combine w/ comprehensive assessment (listed just below) when performed on new 0 pt. evals) '[]'  - 0 Comprehensive Assessment (HX, ROS, Risk Assessments, Wounds Hx, etc.) ASSESSMENTS - Wound and Skin Assessment / Reassessment '[]'  - Dermatologic / Skin Assessment (not related to wound area) 0 ASSESSMENTS - Ostomy and/or Continence Assessment and Care '[]'  - Incontinence Assessment and Management 0 '[]'  - 0 Ostomy Care Assessment and Management (repouching, etc.) PROCESS - Coordination of Care '[]'  - Simple Patient / Family Education for ongoing care 0 '[]'  - 0 Complex (extensive) Patient / Family Education for ongoing care '[]'  - 0 Staff obtains Programmer, systems, Records, Test Results / Process Orders '[]'  - 0 Staff telephones HHA, Nursing Homes / Clarify orders / etc '[]'  - 0 Routine Transfer to another Facility (non-emergent condition) '[]'  - 0 Routine Hospital Admission (non-emergent condition) '[]'  - 0 New Admissions / Biomedical engineer / Ordering NPWT, Apligraf, etc. '[]'  - 0 Emergency Hospital Admission (emergent condition) PROCESS - Special Needs '[]'  - Pediatric / Minor Patient Management 0 '[]'  - 0 Isolation Patient Management '[]'  - 0 Hearing / Language / Visual special needs '[]'  - 0 Assessment of Community assistance (transportation, D/C planning, etc.) '[]'  - 0 Additional assistance / Altered mentation '[]'  - 0 Support Surface(s) Assessment (bed, cushion, seat, etc.) INTERVENTIONS - Miscellaneous '[]'  - External ear exam 0 '[]'  - 0 Patient Transfer (multiple staff / Civil Service fast streamer / Similar devices) '[]'  - 0 Simple Staple / Suture removal (25 or less) '[]'  - 0 Complex Staple / Suture removal (26 or more) '[]'  - 0 Hypo/Hyperglycemic Management (do not check if billed separately) '[]'  - 0 Ankle / Brachial Index (ABI) - do not check if  billed separately Has the patient been seen at the hospital within the last three years: Yes Total Score: 0 Level  Of Care: ____ [name] ([id number]) Electronic Signature(s) Signed: 09/05/2021 10:13:49 AM By: Carlene Coria RN Entered By: Carlene Coria on 09/03/2021 13:07:15 [name] ([id number]) -------------------------------------------------------------------------------- Encounter Discharge Information Details Patient Name: [name], [name]. Date of Service: 09/03/2021 12:30 PM Medical Record Number: [id number] Patient Account Number: [id number] Date of Birth/Sex: 1961-04-26 (60 y.o. M) Treating RN: Carlene Coria Primary Care Cordero Surette: Karl Ito Other Clinician: Referring Rykar Lebleu: Karl Ito Treating Chukwuebuka Churchill/Extender: Yaakov Guthrie in Treatment: 4 Encounter Discharge Information Items Post Procedure Vitals Discharge Condition: Stable Temperature (F): 98.1 Ambulatory Status: Ambulatory Pulse (bpm): 79 Discharge Destination: Home Respiratory Rate (breaths/min): 18 Transportation: Private Auto Blood Pressure (mmHg): 123/72 Accompanied By: self Schedule Follow-up Appointment: Yes Clinical Summary of Care: Patient Declined Electronic Signature(s) Signed: 09/05/2021 10:13:49 AM By: Carlene Coria RN Entered By: Carlene Coria on 09/03/2021 13:14:35 [name] ([id number]) -------------------------------------------------------------------------------- Lower Extremity Assessment Details Patient Name: [name], [name]. Date of Service: 09/03/2021 12:30 PM Medical Record Number: [id number] Patient Account Number: [id number] Date of Birth/Sex: [birth year] (60 y.o. M) Treating RN: Carlene Coria Primary Care Kensington Rios: Karl Ito Other Clinician: Referring Wen Munford: Karl Ito Treating Lynnda Wiersma/Extender: Yaakov Guthrie in Treatment: 4 Edema Assessment Assessed: [Left: No] [Right: No] Edema: [Left: No] [Right: No] Calf Left: Right: Point of  Measurement: 32 cm From Medial Instep 35.5 cm 34 cm Ankle Left: Right: Point of Measurement: 10 cm From Medial Instep 21 cm 20 cm Knee To Floor Left: Right: From Medial Instep 41 cm 41 cm Vascular Assessment Pulses: Dorsalis Pedis Palpable: [Left:Yes] [Right:Yes] Electronic Signature(s) Signed: 09/05/2021 10:13:49 AM By: Carlene Coria RN Entered By: Carlene Coria on 09/03/2021 12:53:16 [name] ([id number]) -------------------------------------------------------------------------------- Multi Wound Chart Details Patient Name: [name]. Date of Service: 09/03/2021 12:30 PM Medical Record Number: [id number] Patient Account Number: [id number] Date of Birth/Sex: 10-16-1961 (60 y.o. 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/Ulcer of the Lower Diabetic Wound/Ulcer of the Lower Extremity Extremity Extremity Comorbid History: Coronary Artery Disease, Coronary Artery Disease, Coronary Artery Disease, Hypertension, Myocardial Infarction, Hypertension, Myocardial Infarction, Hypertension, Myocardial Infarction, Type II Diabetes Type II Diabetes Type II Diabetes Date Acquired: 07/19/2021 07/19/2021 07/19/2021 Weeks of Treatment: '4 4 4 ' Wound Status: Open Open Open Measurements L x W x D (cm) 1.7x1.7x0.1 1x0.7x0.1 0.3x0.5x0.1 Area (cm) : 2.27 0.55 0.118 Volume (cm) : 0.227 0.055 0.012 % Reduction in Area: 79.40% 82.00% 85.00% %  Reduction in Volume: 79.40% 82.00% 84.80% Classification: Grade 2 Grade 2 Grade 2 Exudate Amount: Medium Medium Medium Exudate Type: Serosanguineous Serosanguineous Serosanguineous Exudate Color: red, brown red, brown red, brown Granulation Amount: Small (1-33%) Small (1-33%) Small (1-33%) Granulation Quality: Red Red Red Necrotic Amount: Large (67-100%) Large (67-100%) Large (67-100%) Necrotic Tissue: Adherent Fritch Exposed Structures: Fat Layer (Subcutaneous Tissue): Fat Layer (Subcutaneous Tissue): Fat Layer (Subcutaneous Tissue): Yes Yes Yes Fascia: No Fascia: No Fascia: No Tendon: No Tendon: No Tendon: No Muscle: No Muscle: No Muscle: No Joint: No Joint: No Joint: No Bone: No Bone: No Bone: No Epithelialization: None None None Debridement: Debridement - Excisional Debridement - Excisional Debridement - Excisional  Pre-procedure Verification/Time 13:03 13:03 13:03 Out Taken: Tissue Debrided: Subcutaneous, Slough Subcutaneous, Slough Subcutaneous, Slough Level: Skin/Subcutaneous Tissue Skin/Subcutaneous Tissue Skin/Subcutaneous Tissue Debridement Area (sq cm): 2.89 0.7 0.15 Instrument: Curette Curette Curette Bleeding: Minimum Minimum Minimum Hemostasis Achieved: Pressure Pressure Pressure Procedural Pain: 0 0 0 Post Procedural Pain: 0 0 0 [name], [name]. ([id number]) Debridement Treatment Procedure was tolerated well Procedure was tolerated well Procedure was tolerated well Response: Post Debridement 1.7x1.7x0.1 1x0.7x0.1 0.3x0.5x0.1 Measurements L x W x D (cm) Post Debridement Volume: 0.227 0.055 0.012 (cm) Procedures Performed: Debridement Debridement Debridement Treatment Notes Electronic Signature(s) Signed: 09/03/2021 1:11:55 PM By: Kalman Shan DO Entered By: Kalman Shan on 09/03/2021 13:08:39 [name] ([id number]) -------------------------------------------------------------------------------- Wheeler Details Patient Name: [name], [name]. Date of Service: 09/03/2021 12:30 PM Medical Record Number: [id number] Patient Account Number: [id number] Date of Birth/Sex: 1961-10-02 (60 y.o. M) Treating RN: Carlene Coria Primary Care Viola Placeres: Karl Ito Other Clinician: Referring Marcello Tuzzolino: Karl Ito Treating Leighana Neyman/Extender: Yaakov Guthrie in Treatment: 4 Active Inactive Wound/Skin Impairment Nursing Diagnoses: Knowledge deficit related to ulceration/compromised skin integrity Goals: Patient/caregiver will verbalize understanding of skin care regimen Date Initiated: 08/06/2021 Date Inactivated: 08/13/2021 Target Resolution Date: 09/06/2021 Goal Status: Met Ulcer/skin breakdown will have a volume reduction of 30% by week 4 Date Initiated: 08/06/2021 Target Resolution Date: 10/06/2021 Goal Status: Active Ulcer/skin breakdown will have a volume reduction of 50% by week 8 Date Initiated: 08/06/2021 Target Resolution Date: 11/06/2021 Goal Status: Active Ulcer/skin breakdown will have a volume reduction of 80% by week 12 Date Initiated: 08/06/2021 Target Resolution Date: 12/07/2021 Goal Status: Active Ulcer/skin breakdown will heal within 14 weeks Date Initiated: 08/06/2021 Target Resolution Date: 01/04/2022 Goal Status: Active Interventions: Assess patient/caregiver ability to obtain necessary supplies Assess patient/caregiver ability to perform ulcer/skin care regimen upon admission and as needed Assess ulceration(s) every visit Notes: Electronic Signature(s) Signed: 09/05/2021 10:13:49 AM By: Carlene Coria RN Entered By: Carlene Coria on 09/03/2021 13:01:07 [name] ([id number]) -------------------------------------------------------------------------------- Pain Assessment Details Patient Name: [name], [name]. Date of Service: 09/03/2021 12:30 PM Medical Record Number: [id number] Patient Account Number: [id number] Date of Birth/Sex: 09/03/1961 (60 y.o.  M) Treating RN: Carlene Coria Primary Care Merrin Mcvicker: Karl Ito Other Clinician: Referring Peterson Mathey: Karl Ito Treating Kursten Kruk/Extender: Yaakov Guthrie in Treatment: 4 Active Problems Location of Pain Severity and Description of Pain Patient Has Paino No Site Locations Pain Management and Medication Current Pain Management: Electronic Signature(s) Signed: 09/05/2021 10:13:49 AM By: Carlene Coria RN Entered By: Carlene Coria on 09/03/2021 12:41:27 [name] ([id number]) -------------------------------------------------------------------------------- Patient/Caregiver Education Details Patient Name: [name], [name]. Date of Service: 09/03/2021 12:30 PM Medical Record Number: [id number] Patient Account Number: [id number] Date of Birth/Gender: [birth year] (60 y.o. M) Treating RN: Carlene Coria Primary Care Physician: Karl Ito Other Clinician: Referring Physician: Karl Ito Treating Physician/Extender: Yaakov Guthrie in Treatment: 4 Education Assessment Education Provided To: Patient Education Topics Provided Wound/Skin Impairment: Methods: Explain/Verbal Responses: State content correctly Electronic Signature(s) Signed: 09/05/2021 10:13:49 AM By: Carlene Coria RN Entered By: Carlene Coria on 09/03/2021 13:07:37 [name] ([id number]) -------------------------------------------------------------------------------- Wound Assessment Details Patient Name: [name], [name]. Date of Service: 09/03/2021 12:30 PM Medical Record Number: [id number] Patient Account Number: [id number] Date of Birth/Sex: 1961/02/27 (60 y.o. M) Treating RN: Carlene Coria Primary Care Toby Breithaupt: Karl Ito Other Clinician: Referring Graysen Depaula: Karl Ito Treating Calina Patrie/Extender: Yaakov Guthrie in Treatment: 4 Wound Status Wound Number: 1 Primary Diabetic Wound/Ulcer of the Lower Extremity Etiology: Wound Location: Right, Posterior Lower Leg  Wound Status:  Open Wounding Event: Gradually Appeared Comorbid Coronary Artery Disease, Hypertension, Myocardial Date Acquired: 07/19/2021 History: Infarction, Type II Diabetes Weeks Of Treatment: 4 Clustered Wound: No Photos Wound Measurements Length: (cm) 1.7 Width: (cm) 1.7 Depth: (cm) 0.1 Area: (cm) 2.27 Volume: (cm) 0.227 % Reduction in Area: 79.4% % Reduction in Volume: 79.4% Epithelialization: None Tunneling: No Undermining: No Wound Description Classification: Grade 2 Exudate Amount: Medium Exudate Type: Serosanguineous Exudate Color: red, brown Foul Odor After Cleansing: No Slough/Fibrino Yes Wound Bed Granulation Amount: Small (1-33%) Exposed Structure Granulation Quality: Red Fascia Exposed: No Necrotic Amount: Large (67-100%) Fat Layer (Subcutaneous Tissue) Exposed: Yes Necrotic Quality: Adherent Slough Tendon Exposed: No Muscle Exposed: No Joint Exposed: No Bone Exposed: No Treatment Notes Wound #1 (Lower Leg) Wound Laterality: Right, Posterior Cleanser Byram Ancillary Kit - 15 Day Supply Discharge Instruction: Use supplies as instructed; Kit contains: (15) Saline Bullets; (15) 3x3 Gauze; 15 pr Gloves Soap and Water Discharge Instruction: Gently cleanse wound with antibacterial soap, rinse and pat dry prior to dressing wounds JAYDIEN, PANEPINTO ([id number]) Peri-Wound Care Topical Primary Dressing Gauze Discharge Instruction: As directed: dry, moistened with saline or moistened with Dakins Solution Santyl Collagenase Ointment, 30 (gm), tube Secondary Dressing Zetuvit Plus Silicone Border Dressing 4x4 (in/in) Secured With Compression Wrap Compression Stockings Add-Ons Electronic Signature(s) Signed: 09/05/2021 10:13:49 AM By: Carlene Coria RN Entered By: Carlene Coria on 09/03/2021 12:49:09 [name] ([id number]) -------------------------------------------------------------------------------- Wound Assessment Details Patient Name: [name], [name]. Date  of Service: 09/03/2021 12:30 PM Medical Record Number: [id number] Patient Account Number: [id number] Date of Birth/Sex: 1960-12-13 (60 y.o. M) Treating RN: Carlene Coria Primary Care Mahina Salatino: Karl Ito Other Clinician: Referring Carletha Dawn: Karl Ito Treating Tosca Pletz/Extender: Yaakov Guthrie in Treatment: 4 Wound Status Wound Number: 2 Primary Diabetic Wound/Ulcer of the Lower Extremity Etiology: Wound Location: Right, Medial Lower Leg Wound Status: Open Wounding Event: Gradually Appeared Comorbid Coronary Artery Disease, Hypertension, Myocardial Date Acquired: 07/19/2021 History: Infarction, Type II Diabetes Weeks Of Treatment: 4 Clustered Wound: No Photos Wound Measurements Length: (cm) 1 Width: (cm) 0.7 Depth: (cm) 0.1 Area: (cm) 0.55 Volume: (cm) 0.055 % Reduction in Area: 82% % Reduction in Volume: 82% Epithelialization: None Tunneling: No Undermining: No Wound Description Classification: Grade 2 Exudate Amount: Medium Exudate Type: Serosanguineous Exudate Color: red, brown Foul Odor After Cleansing: No Slough/Fibrino Yes Wound Bed Granulation Amount: Small (1-33%) Exposed Structure Granulation Quality: Red Fascia Exposed: No Necrotic Amount: Large (67-100%) Fat Layer (Subcutaneous Tissue) Exposed: Yes Necrotic Quality: Adherent Slough Tendon Exposed: No Muscle Exposed: No Joint Exposed: No Bone Exposed: No Treatment Notes Wound #2 (Lower Leg) Wound Laterality: Right, Medial Cleanser Byram Ancillary Kit - 15 Day Supply Discharge Instruction: Use supplies as instructed; Kit contains: (15) Saline Bullets; (15) 3x3 Gauze; 15 pr Gloves Soap and Water Discharge Instruction: Gently cleanse wound with antibacterial soap, rinse and pat dry prior to dressing wounds EAMES, DIBIASIO ([id number]) Peri-Wound Care Topical Primary Dressing Gauze Discharge Instruction: As directed: dry, moistened with saline or moistened with Dakins Solution Santyl  Collagenase Ointment, 30 (gm), tube Secondary Dressing Kerlix 4.5 x 4.1 (in/yd) Discharge Instruction: Apply Kerlix 4.5 x 4.1 (in/yd) as instructed Zetuvit Plus Silicone Border Dressing 4x4 (in/in) Secured With Compression Wrap Compression Stockings Add-Ons Electronic Signature(s) Signed: 09/05/2021 10:13:49 AM By: Carlene Coria RN Entered By: Carlene Coria on 09/03/2021 12:49:41 [name] ([id number]) -------------------------------------------------------------------------------- Wound Assessment Details Patient Name: [name], URBANI. Date of Service: 09/03/2021 12:30 PM Medical Record Number: [id number] Patient Account Number:  [id number] Date of Birth/Sex: [birth year] (60 y.o. M) Treating RN: Carlene Coria Primary Care Hildagarde Holleran: Karl Ito Other Clinician: Referring Berthe Oley: Karl Ito Treating Harriette Tovey/Extender: Yaakov Guthrie in Treatment: 4 Wound Status Wound Number: 5 Primary Diabetic Wound/Ulcer of the Lower Extremity Etiology: Wound Location: Left, Medial Lower Leg Wound Status: Open Wounding Event: Gradually Appeared Comorbid Coronary Artery Disease, Hypertension, Myocardial Date Acquired: 07/19/2021 History: Infarction, Type II Diabetes Weeks Of Treatment: 4 Clustered Wound: No Photos Wound Measurements Length: (cm) 0.3 Width: (cm) 0.5 Depth: (cm) 0.1 Area: (cm) 0.118 Volume: (cm) 0.012 % Reduction in Area: 85% % Reduction in Volume: 84.8% Epithelialization: None Tunneling: No Undermining: No Wound Description Classification: Grade 2 Exudate Amount: Medium Exudate Type: Serosanguineous Exudate Color: red, brown Foul Odor After Cleansing: No Slough/Fibrino Yes Wound Bed Granulation Amount: Small (1-33%) Exposed Structure Granulation Quality: Red Fascia Exposed: No Necrotic Amount: Large (67-100%) Fat Layer (Subcutaneous Tissue) Exposed: Yes Necrotic Quality: Eschar Tendon Exposed: No Muscle Exposed: No Joint Exposed: No Bone  Exposed: No Treatment Notes Wound #5 (Lower Leg) Wound Laterality: Left, Medial Cleanser Byram Ancillary Kit - 15 Day Supply Discharge Instruction: Use supplies as instructed; Kit contains: (15) Saline Bullets; (15) 3x3 Gauze; 15 pr Gloves Soap and Water Discharge Instruction: Gently cleanse wound with antibacterial soap, rinse and pat dry prior to dressing wounds KETHAN, PAPADOPOULOS ([id number]) Peri-Wound Care Topical Primary Dressing Gauze Discharge Instruction: As directed: dry, moistened with saline or moistened with Dakins Solution Santyl Collagenase Ointment, 30 (gm), tube Secondary Dressing Zetuvit Plus Silicone Border Dressing 4x4 (in/in) Secured With Compression Wrap Compression Stockings Add-Ons Electronic Signature(s) Signed: 09/05/2021 10:13:49 AM By: Carlene Coria RN Entered By: Carlene Coria on 09/03/2021 12:50:10 [name] ([id number]) -------------------------------------------------------------------------------- Vitals Details Patient Name: [name]. Date of Service: 09/03/2021 12:30 PM Medical Record Number: [id number] Patient Account Number: [id number] Date of Birth/Sex: 1961/03/04 (60 y.o. M) Treating RN: Carlene Coria Primary Care Aubrynn Katona: Karl Ito Other Clinician: Referring Wendell Fiebig: Karl Ito Treating Crystalle Popwell/Extender: Yaakov Guthrie in Treatment: 4 Vital Signs Time Taken: 12:40 Temperature (F): 98.1 Height (in): 65 Pulse (bpm): 79 Weight (lbs): 180 Respiratory Rate (breaths/min): 18 Body Mass Index (BMI): 30 Blood Pressure (mmHg): 123/72 Reference Range: 80 - 120 mg / dl Electronic Signature(s) Signed: 09/05/2021 10:13:49 AM By: Carlene Coria RN Entered By: Carlene Coria on 09/03/2021 12:41:16

## 2021-09-10 ENCOUNTER — Other Ambulatory Visit: Payer: Self-pay

## 2021-09-10 ENCOUNTER — Encounter (HOSPITAL_BASED_OUTPATIENT_CLINIC_OR_DEPARTMENT_OTHER): Payer: Medicare Other | Admitting: Internal Medicine

## 2021-09-10 DIAGNOSIS — L97919 Non-pressure chronic ulcer of unspecified part of right lower leg with unspecified severity: Secondary | ICD-10-CM

## 2021-09-10 DIAGNOSIS — E11622 Type 2 diabetes mellitus with other skin ulcer: Secondary | ICD-10-CM | POA: Diagnosis not present

## 2021-09-17 ENCOUNTER — Other Ambulatory Visit: Payer: Self-pay

## 2021-09-17 ENCOUNTER — Encounter (HOSPITAL_BASED_OUTPATIENT_CLINIC_OR_DEPARTMENT_OTHER): Payer: Medicare Other | Admitting: Internal Medicine

## 2021-09-17 DIAGNOSIS — T25222A Burn of second degree of left foot, initial encounter: Secondary | ICD-10-CM | POA: Diagnosis not present

## 2021-09-17 DIAGNOSIS — E11622 Type 2 diabetes mellitus with other skin ulcer: Secondary | ICD-10-CM | POA: Diagnosis not present
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
## 2021-09-17 NOTE — Progress Notes (Signed)
HEATON, SARIN ([id number]) Visit Report for 09/17/2021 Arrival Information Details Patient Name: RUVIM, RISKO. Date of Service: 09/17/2021 12:30 PM Medical Record Number: [id number] Patient Account Number: [id number] Date of Birth/Sex: [birth year] (60 y.o. M) Treating RN: Carlene Coria Primary Care Dynasty Holquin: Karl Ito Other Clinician: Referring Taelyn Nemes: Karl Ito Treating Holleigh Crihfield/Extender: Yaakov Guthrie in Treatment: 6 Visit Information History Since Last Visit All ordered tests and consults were completed: No Patient Arrived: Ambulatory Added or deleted any medications: No Arrival Time: 12:41 Any new allergies or adverse reactions: No Accompanied By: daughter Had a fall or experienced change in No Transfer Assistance: None activities of daily living that may affect Patient Identification Verified: Yes risk of falls: Secondary Verification Process Completed: Yes Signs or symptoms of abuse/neglect since last visito No Patient Requires Transmission-Based Precautions: No Hospitalized since last visit: No Patient Has Alerts: No Implantable device outside of the clinic excluding No cellular tissue based products placed in the center since last visit: Has Dressing in Place as Prescribed: Yes Pain Present Now: No Electronic Signature(s) Signed: 09/17/2021 2:51:09 PM By: Carlene Coria RN Entered By: Carlene Coria on 09/17/2021 12:45:07 [name] ([id number]) -------------------------------------------------------------------------------- Clinic Level of Care Assessment Details Patient Name: EAMONN, SERMENO. Date of Service: 09/17/2021 12:30 PM Medical Record Number: [id number] Patient Account Number: [id number] Date of Birth/Sex: 1961-07-27 (60 y.o. M) Treating RN: Carlene Coria Primary Care Macy Polio: Karl Ito Other Clinician: Referring Cari Burgo: Karl Ito Treating Nafeesah Lapaglia/Extender: Yaakov Guthrie in Treatment: 6 Clinic Level of Care  Assessment Items TOOL 1 Quantity Score [] - Use when EandM and Procedure is performed on INITIAL visit 0 ASSESSMENTS - Nursing Assessment / Reassessment [] - General Physical Exam (combine w/ comprehensive assessment (listed just below) when performed on new 0 pt. evals) [] - 0 Comprehensive Assessment (HX, ROS, Risk Assessments, Wounds Hx, etc.) ASSESSMENTS - Wound and Skin Assessment / Reassessment [] - Dermatologic / Skin Assessment (not related to wound area) 0 ASSESSMENTS - Ostomy and/or Continence Assessment and Care [] - Incontinence Assessment and Management 0 [] - 0 Ostomy Care Assessment and Management (repouching, etc.) PROCESS - Coordination of Care [] - Simple Patient / Family Education for ongoing care 0 [] - 0 Complex (extensive) Patient / Family Education for ongoing care [] - 0 Staff obtains Programmer, systems, Records, Test Results / Process Orders [] - 0 Staff telephones HHA, Nursing Homes / Clarify orders / etc [] - 0 Routine Transfer to another Facility (non-emergent condition) [] - 0 Routine Hospital Admission (non-emergent condition) [] - 0 New Admissions / Biomedical engineer / Ordering NPWT, Apligraf, etc. [] - 0 Emergency Hospital Admission (emergent condition) PROCESS - Special Needs [] - Pediatric / Minor Patient Management 0 [] - 0 Isolation Patient Management [] - 0 Hearing / Language / Visual special needs [] - 0 Assessment of Community assistance (transportation, D/C planning, etc.) [] - 0 Additional assistance / Altered mentation [] - 0 Support Surface(s) Assessment (bed, cushion, seat, etc.) INTERVENTIONS - Miscellaneous [] - External ear exam 0 [] - 0 Patient Transfer (multiple staff / Civil Service fast streamer / Similar devices) [] - 0 Simple Staple / Suture removal (25 or less) [] - 0 Complex Staple / Suture removal (26 or more) [] - 0 Hypo/Hyperglycemic Management (do not check if billed separately) [] - 0 Ankle / Brachial Index (ABI) - do not  check if billed separately Has the patient been seen at the hospital within the last three years: Yes Total Score: 0 Level  Of Care: ____ [name] ([id number]) Electronic Signature(s) Signed: 09/17/2021 2:51:09 PM By: Carlene Coria RN Entered By: Carlene Coria on 09/17/2021 13:21:21 AMAAN, MEYER ([id number]) -------------------------------------------------------------------------------- Encounter Discharge Information Details Patient Name: JEEVAN, KALLA. Date of Service: 09/17/2021 12:30 PM Medical Record Number: [id number] Patient Account Number: [id number] Date of Birth/Sex: 1961-05-25 (60 y.o. M) Treating RN: Carlene Coria Primary Care Danaysha Kirn: Karl Ito Other Clinician: Referring Hal Norrington: Karl Ito Treating Nachelle Negrette/Extender: Yaakov Guthrie in Treatment: 6 Encounter Discharge Information Items Post Procedure Vitals Discharge Condition: Stable Temperature (F): 98.2 Ambulatory Status: Ambulatory Pulse (bpm): 78 Discharge Destination: Home Respiratory Rate (breaths/min): 18 Transportation: Private Auto Blood Pressure (mmHg): 121/77 Accompanied By: self Schedule Follow-up Appointment: Yes Clinical Summary of Care: Patient Declined Electronic Signature(s) Signed: 09/17/2021 2:51:09 PM By: Carlene Coria RN Entered By: Carlene Coria on 09/17/2021 13:22:51 [name] ([id number]) -------------------------------------------------------------------------------- Lower Extremity Assessment Details Patient Name: DEVESH, MONFORTE. Date of Service: 09/17/2021 12:30 PM Medical Record Number: [id number] Patient Account Number: [id number] Date of Birth/Sex: [birth year] (60 y.o. M) Treating RN: Carlene Coria Primary Care Georjean Toya: Karl Ito Other Clinician: Referring Karolyna Bianchini: Karl Ito Treating Cosandra Plouffe/Extender: Yaakov Guthrie in Treatment: 6 Edema Assessment Assessed: [Left: No] [Right: No] Edema: [Left: Yes] [Right: Yes] Calf Left:  Right: Point of Measurement: 31 cm From Medial Instep 35 cm 33 cm Ankle Left: Right: Point of Measurement: 10 cm From Medial Instep 21 cm 20 cm Vascular Assessment Pulses: Dorsalis Pedis Palpable: [Left:Yes] [Right:Yes] Electronic Signature(s) Signed: 09/17/2021 2:51:09 PM By: Carlene Coria RN Entered By: Carlene Coria on 09/17/2021 13:14:41 [name] ([id number]) -------------------------------------------------------------------------------- Multi Wound Chart Details Patient Name: [name] Date of Service: 09/17/2021 12:30 PM Medical Record Number: [id number] Patient Account Number: [id number] Date of Birth/Sex: 18-Jun-1961 (60 y.o. 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 Extremity Extremity Comorbid History: Coronary Artery Disease, Coronary Artery Disease, Coronary Artery Disease, Hypertension, Myocardial Infarction, Hypertension, Myocardial Infarction, Hypertension, Myocardial Infarction, Type II Diabetes Type II Diabetes Type II Diabetes Date Acquired: 07/19/2021 07/19/2021 07/19/2021 Weeks of Treatment: _0 Wound Status: Open Open Open Clustered Wound: No No No Measurements L x W x D (cm) 0x0x0 0x0x0 0x0x0 Area (cm) : 0 0 0 Volume (cm) : 0 0 0 % Reduction in Area: 100.00% 100.00% 100.00% % Reduction in Volume: 100.00% 100.00% 100.00% Classification:  Grade 2 Grade 2 Grade 2 Exudate Amount: None Present None Present None Present Exudate Type: N/A N/A N/A Exudate Color: N/A N/A N/A Granulation Amount: None Present (0%) None Present (0%) None Present (0%) Granulation Quality: N/A N/A N/A Necrotic Amount: None Present (0%) None Present (0%) None Present (0%) Exposed Structures: Fascia: No Fascia: No Fascia: No Fat Layer (Subcutaneous Tissue): Fat Layer (Subcutaneous Tissue): Fat Layer (Subcutaneous Tissue): No No No Tendon: No Tendon: No Tendon: No Muscle: No Muscle: No Muscle: No Joint: No Joint: No Joint: No Bone: No Bone: No Bone: No Epithelialization: Large (67-100%) None Large (67-100%) Debridement: N/A N/A N/A Level: N/A N/A N/A Debridement Area (sq cm): N/A N/A N/A Instrument:  N/A N/A N/A Bleeding: N/A N/A N/A Hemostasis Achieved: N/A N/A N/A Procedural Pain: N/A N/A N/A Post Procedural Pain: N/A N/A N/A Debridement Treatment N/A N/A N/A Response: Post Debridement N/A N/A N/A Measurements L x W x D (cm) KUMAR, FALWELL. ([id number]) Post Debridement Volume: N/A N/A N/A (cm) Procedures Performed: N/A N/A N/A Wound Number: 6 N/A N/A Photos: N/A N/A Wound Location: Right, Distal, Circumferential Foot N/A N/A Wounding Event: Thermal Burn N/A N/A Primary Etiology: Diabetic Wound/Ulcer of the Lower N/A N/A Extremity Comorbid History: Coronary Artery Disease, N/A N/A Hypertension, Myocardial Infarction, Type II Diabetes Date Acquired: 09/14/2021 N/A N/A Weeks of Treatment: 0 N/A N/A Wound Status: Open N/A N/A Clustered Wound: Yes N/A N/A Measurements L x W x D (cm) 7x5x0.1 N/A N/A Area (cm) : 27.489 N/A N/A Volume (cm) : 2.749 N/A N/A % Reduction in Area: N/A N/A N/A % Reduction in Volume: N/A N/A N/A Classification: Grade 2 N/A N/A Exudate Amount: Medium N/A N/A Exudate Type: Serosanguineous N/A N/A Exudate Color: red, brown N/A N/A Granulation Amount: Medium (34-66%) N/A N/A Granulation Quality:  Red N/A N/A Necrotic Amount: Medium (34-66%) N/A N/A Exposed Structures: Fat Layer (Subcutaneous Tissue): N/A N/A Yes Fascia: No Tendon: No Muscle: No Joint: No Bone: No Epithelialization: None N/A N/A Debridement: Debridement - Selective/Open N/A N/A Wound Pre-procedure Verification/Time 13:13 N/A N/A Out Taken: Level: Skin/Epidermis N/A N/A Debridement Area (sq cm): 35 N/A N/A Instrument: Forceps, Scissors N/A N/A Bleeding: Minimum N/A N/A Hemostasis Achieved: Pressure N/A N/A Procedural Pain: 0 N/A N/A Post Procedural Pain: 0 N/A N/A Debridement Treatment Procedure was tolerated well N/A N/A Response: Post Debridement 7x5x0.1 N/A N/A Measurements L x W x D (cm) Post Debridement Volume: 2.749 N/A N/A (cm) Procedures Performed: Debridement N/A N/A Treatment Notes Wound #1 (Lower Leg) Wound Laterality: Right, Posterior Cleanser Peri-Wound Care FELDER, LEBEDA ([id number]) Topical Primary Dressing Secondary Dressing Secured With Compression Wrap Compression Stockings Add-Ons Wound #2 (Lower Leg) Wound Laterality: Right, Medial Cleanser Peri-Wound Care Topical Primary Dressing Secondary Dressing Secured With Compression Wrap Compression Stockings Add-Ons Wound #5 (Lower Leg) Wound Laterality: Left, Medial Cleanser Peri-Wound Care Topical Primary Dressing Secondary Dressing Secured With Compression Wrap Compression Stockings Add-Ons Wound #6 (Foot) Wound Laterality: Right, Distal, Circumferential Cleanser Soap and Water Discharge Instruction: Gently cleanse wound with antibacterial soap, rinse and pat dry prior to dressing wounds Peri-Wound Care Topical Silvadene Cream Discharge Instruction: Apply as directed by provider. Primary Dressing Gauze Discharge Instruction: As directed: dry, moistened with saline or moistened with Dakins Solution [address] BIENVENIDO, PROEHL ([id number]) Secured With Compression Wrap Compression  Stockings Add-Ons Electronic Signature(s) Signed: 09/17/2021 1:47:45 PM By: Kalman Shan DO Entered By: Kalman Shan on 09/17/2021 13:40:25 [name] ([id number]) -------------------------------------------------------------------------------- [place] Details Patient Name: WAYDEN, SCHWERTNER. Date of Service: 09/17/2021 12:30 PM Medical Record Number: [id number] Patient Account Number: [id number] Date of Birth/Sex: June 13, 1961 (60 y.o. M) Treating RN: Carlene Coria Primary Care Provider: Karl Ito Other Clinician: Referring Provider: Karl Ito Treating Provider/Extender: Yaakov Guthrie in Treatment: 6 Active Inactive Wound/Skin Impairment Nursing Diagnoses: Knowledge deficit related to ulceration/compromised skin integrity Goals: Patient/caregiver will verbalize understanding of skin care regimen Date Initiated: 08/06/2021 Date Inactivated: 08/13/2021 Target Resolution Date: 09/06/2021 Goal Status: Met Ulcer/skin breakdown will have a volume reduction of 30% by week 4 Date Initiated: 08/06/2021 Target Resolution Date: 10/06/2021 Goal Status: Active Ulcer/skin breakdown will have a volume reduction of 50% by week 8 Date Initiated: 08/06/2021 Target Resolution Date: 11/06/2021  Goal Status: Active Ulcer/skin breakdown will have a volume reduction of 80% by week 12 Date Initiated: 08/06/2021 Target Resolution Date: 12/07/2021 Goal Status: Active Ulcer/skin breakdown will heal within 14 weeks Date Initiated: 08/06/2021 Target Resolution Date: 01/04/2022 Goal Status: Active Interventions: Assess patient/caregiver ability to obtain necessary supplies Assess patient/caregiver ability to perform ulcer/skin care regimen upon admission and as needed Assess ulceration(s) every visit Notes: Electronic Signature(s) Signed: 09/17/2021 2:51:09 PM By: Carlene Coria RN Entered By: Carlene Coria on 09/17/2021 13:08:28 [name]  ([id number]) -------------------------------------------------------------------------------- Pain Assessment Details Patient Name: RAJ, LANDRESS. Date of Service: 09/17/2021 12:30 PM Medical Record Number: [id number] Patient Account Number: [id number] Date of Birth/Sex: 1961-01-13 (60 y.o. M) Treating RN: Carlene Coria Primary Care Antonella Upson: Karl Ito Other Clinician: Referring Quint Chestnut: Karl Ito Treating Haylyn Halberg/Extender: Yaakov Guthrie in Treatment: 6 Active Problems Location of Pain Severity and Description of Pain Patient Has Paino No Site Locations Pain Management and Medication Current Pain Management: Electronic Signature(s) Signed: 09/17/2021 2:51:09 PM By: Carlene Coria RN Entered By: Carlene Coria on 09/17/2021 12:45:48 [name] ([id number]) -------------------------------------------------------------------------------- Patient/Caregiver Education Details Patient Name: CEASAR, DECANDIA. Date of Service: 09/17/2021 12:30 PM Medical Record Number: [id number] Patient Account Number: [id number] Date of Birth/Gender: 1961/08/24 (60 y.o. M) Treating RN: Carlene Coria Primary Care Physician: Karl Ito Other Clinician: Referring Physician: Karl Ito Treating Physician/Extender: Yaakov Guthrie in Treatment: 6 Education Assessment Education Provided To: Patient Education Topics Provided Wound/Skin Impairment: Methods: Explain/Verbal Responses: State content correctly Electronic Signature(s) Signed: 09/17/2021 2:51:09 PM By: Carlene Coria RN Entered By: Carlene Coria on 09/17/2021 13:21:49 LAVONNE, CASS ([id number]) -------------------------------------------------------------------------------- Wound Assessment Details Patient Name: QURAN, VASCO. Date of Service: 09/17/2021 12:30 PM Medical Record Number: [id number] Patient Account Number: [id number] Date of Birth/Sex: 1961-04-20 (60 y.o. M) Treating RN: Carlene Coria Primary Care  Yzabella Crunk: Karl Ito Other Clinician: Referring Arianna Delsanto: Karl Ito Treating Teah Votaw/Extender: Yaakov Guthrie in Treatment: 6 Wound Status Wound Number: 1 Primary Diabetic Wound/Ulcer of the Lower Extremity Etiology: Wound Location: Right, Posterior Lower Leg Wound Status: Open Wounding Event: Gradually Appeared Comorbid Coronary Artery Disease, Hypertension, Myocardial Date Acquired: 07/19/2021 History: Infarction, Type II Diabetes Weeks Of Treatment: 6 Clustered Wound: No Photos Wound Measurements Length: (cm) 0 Width: (cm) 0 Depth: (cm) 0 Area: (cm) 0 Volume: (cm) 0 % Reduction in Area: 100% % Reduction in Volume: 100% Epithelialization: Large (67-100%) Tunneling: No Undermining: No Wound Description Classification: Grade 2 Exudate Amount: None Present Foul Odor After Cleansing: No Slough/Fibrino No Wound Bed Granulation Amount: None Present (0%) Exposed Structure Necrotic Amount: None Present (0%) Fascia Exposed: No Fat Layer (Subcutaneous Tissue) Exposed: No Tendon Exposed: No Muscle Exposed: No Joint Exposed: No Bone Exposed: No Electronic Signature(s) Signed: 09/17/2021 2:51:09 PM By: Carlene Coria RN Entered By: Carlene Coria on 09/17/2021 13:03:21 [name] ([id number]) -------------------------------------------------------------------------------- Wound Assessment Details Patient Name: BRIXTON, FRANKO. Date of Service: 09/17/2021 12:30 PM Medical Record Number: [id number] Patient Account Number: [id number] Date of Birth/Sex: 1960/11/20 (60 y.o. M) Treating RN: Carlene Coria Primary Care Justyce Baby: Karl Ito Other Clinician: Referring Derika Eckles: Karl Ito Treating Linlee Cromie/Extender: Yaakov Guthrie in Treatment: 6 Wound Status Wound Number: 2 Primary Diabetic Wound/Ulcer of the Lower Extremity Etiology: Wound Location: Right, Medial Lower Leg Wound Status: Open Wounding Event: Gradually Appeared Comorbid Coronary  Artery Disease, Hypertension, Myocardial Date Acquired: 07/19/2021 History: Infarction, Type II Diabetes Weeks Of Treatment: 6 Clustered Wound: No Photos Wound Measurements Length: (cm) 0 Width: (cm) 0 Depth: (cm) 0 Area: (  cm) 0 Volume: (cm) 0 % Reduction in Area: 100% % Reduction in Volume: 100% Epithelialization: None Tunneling: No Undermining: No Wound Description Classification: Grade 2 Exudate Amount: None Present Foul Odor After Cleansing: No Slough/Fibrino No Wound Bed Granulation Amount: None Present (0%) Exposed Structure Necrotic Amount: None Present (0%) Fascia Exposed: No Fat Layer (Subcutaneous Tissue) Exposed: No Tendon Exposed: No Muscle Exposed: No Joint Exposed: No Bone Exposed: No Electronic Signature(s) Signed: 09/17/2021 2:51:09 PM By: Carlene Coria RN Entered By: Carlene Coria on 09/17/2021 13:04:26 [name] ([id number]) -------------------------------------------------------------------------------- Wound Assessment Details Patient Name: VIYAAN, CHAMPINE. Date of Service: 09/17/2021 12:30 PM Medical Record Number: [id number] Patient Account Number: [id number] Date of Birth/Sex: Mar 16, 1961 (60 y.o. M) Treating RN: Carlene Coria Primary Care Johanna Stafford: Karl Ito Other Clinician: Referring Kendry Pfarr: Karl Ito Treating Zinedine Ellner/Extender: Yaakov Guthrie in Treatment: 6 Wound Status Wound Number: 5 Primary Diabetic Wound/Ulcer of the Lower Extremity Etiology: Wound Location: Left, Medial Lower Leg Wound Status: Open Wounding Event: Gradually Appeared Comorbid Coronary Artery Disease, Hypertension, Myocardial Date Acquired: 07/19/2021 History: Infarction, Type II Diabetes Weeks Of Treatment: 6 Clustered Wound: No Photos Wound Measurements Length: (cm) 0 Width: (cm) 0 Depth: (cm) 0 Area: (cm) Volume: (cm) % Reduction in Area: 100% % Reduction in Volume: 100% Epithelialization: Large (67-100%) 0 Tunneling: No 0  Undermining: No Wound Description Classification: Grade 2 Exudate Amount: None Present Foul Odor After Cleansing: No Slough/Fibrino No Wound Bed Granulation Amount: None Present (0%) Exposed Structure Necrotic Amount: None Present (0%) Fascia Exposed: No Fat Layer (Subcutaneous Tissue) Exposed: No Tendon Exposed: No Muscle Exposed: No Joint Exposed: No Bone Exposed: No Electronic Signature(s) Signed: 09/17/2021 2:51:09 PM By: Carlene Coria RN Entered By: Carlene Coria on 09/17/2021 13:05:06 [name] ([id number]) -------------------------------------------------------------------------------- Wound Assessment Details Patient Name: KARSTEN, HOWRY. Date of Service: 09/17/2021 12:30 PM Medical Record Number: [id number] Patient Account Number: [id number] Date of Birth/Sex: Jun 14, 1961 (60 y.o. M) Treating RN: Carlene Coria Primary Care Babe Clenney: Karl Ito Other Clinician: Referring Irine Heminger: Karl Ito Treating Luca Dyar/Extender: Yaakov Guthrie in Treatment: 6 Wound Status Wound Number: 6 Primary Diabetic Wound/Ulcer of the Lower Extremity Etiology: Wound Location: Right, Distal, Circumferential Foot Wound Status: Open Wounding Event: Thermal Burn Comorbid Coronary Artery Disease, Hypertension, Myocardial Date Acquired: 09/14/2021 History: Infarction, Type II Diabetes Weeks Of Treatment: 0 Clustered Wound: Yes Photos Wound Measurements Length: (cm) 7 Width: (cm) 5 Depth: (cm) 0.1 Area: (cm) 27.489 Volume: (cm) 2.749 % Reduction in Area: % Reduction in Volume: Epithelialization: None Tunneling: No Undermining: No Wound Description Classification: Grade 2 Exudate Amount: Medium Exudate Type: Serosanguineous Exudate Color: red, brown Foul Odor After Cleansing: No Slough/Fibrino Yes Wound Bed Granulation Amount: Medium (34-66%) Exposed Structure Granulation Quality: Red Fascia Exposed: No Necrotic Amount: Medium (34-66%) Fat Layer (Subcutaneous  Tissue) Exposed: Yes Necrotic Quality: Adherent Slough Tendon Exposed: No Muscle Exposed: No Joint Exposed: No Bone Exposed: No Electronic Signature(s) Signed: 09/17/2021 2:51:09 PM By: Carlene Coria RN Entered By: Carlene Coria on 09/17/2021 13:02:04 [name] ([id number]) -------------------------------------------------------------------------------- Vitals Details Patient Name: [name] Date of Service: 09/17/2021 12:30 PM Medical Record Number: [id number] Patient Account Number: [id number] Date of Birth/Sex: Dec 22, 1960 (60 y.o. M) Treating RN: Carlene Coria Primary Care Aurora Rody: Karl Ito Other Clinician: Referring Rakim Moone: Karl Ito Treating Manjot Beumer/Extender: Yaakov Guthrie in Treatment: 6 Vital Signs Time Taken: 12:45 Temperature (F): 98.2 Height (in): 65 Pulse (bpm): 78 Weight (lbs): 180 Respiratory Rate (breaths/min): 18 Body Mass Index (BMI): 30 Blood Pressure (mmHg): 121/77 Reference  Range: 80 - 120 mg / dl Electronic Signature(s) Signed: 09/17/2021 2:51:09 PM By: Carlene Coria RN Entered By: Carlene Coria on 09/17/2021 12:45:38

## 2021-09-24 ENCOUNTER — Other Ambulatory Visit: Payer: Self-pay

## 2021-09-24 ENCOUNTER — Encounter: Payer: Medicare Other | Attending: Internal Medicine | Admitting: Internal Medicine

## 2021-09-24 DIAGNOSIS — Z951 Presence of aortocoronary bypass graft: Secondary | ICD-10-CM | POA: Insufficient documentation

## 2021-09-24 DIAGNOSIS — F17210 Nicotine dependence, cigarettes, uncomplicated: Secondary | ICD-10-CM | POA: Insufficient documentation

## 2021-09-24 DIAGNOSIS — L97522 Non-pressure chronic ulcer of other part of left foot with fat layer exposed: Secondary | ICD-10-CM | POA: Insufficient documentation

## 2021-09-24 DIAGNOSIS — I25119 Atherosclerotic heart disease of native coronary artery with unspecified angina pectoris: Secondary | ICD-10-CM | POA: Diagnosis not present

## 2021-09-24 DIAGNOSIS — T25222A Burn of second degree of left foot, initial encounter: Secondary | ICD-10-CM | POA: Diagnosis not present

## 2021-09-24 DIAGNOSIS — E11622 Type 2 diabetes mellitus with other skin ulcer: Secondary | ICD-10-CM | POA: Insufficient documentation

## 2021-09-24 DIAGNOSIS — I110 Hypertensive heart disease with heart failure: Secondary | ICD-10-CM | POA: Diagnosis not present

## 2021-09-24 DIAGNOSIS — X16XXXA Contact with hot heating appliances, radiators and pipes, initial encounter: Secondary | ICD-10-CM | POA: Diagnosis not present

## 2021-09-24 DIAGNOSIS — I5022 Chronic systolic (congestive) heart failure: Secondary | ICD-10-CM | POA: Insufficient documentation

## 2021-09-24 DIAGNOSIS — Z89422 Acquired absence of other left toe(s): Secondary | ICD-10-CM | POA: Diagnosis not present
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
## 2021-09-24 NOTE — Progress Notes (Signed)
[name], [name] ([id number]) Visit Report for 09/10/2021 Arrival Information Details Patient Name: [name], [name]. Date of Service: 09/10/2021 12:30 PM Medical Record Number: [id number] Patient Account Number: [id number] Date of Birth/Sex: 07-13-1961 (60 y.o. M) Treating RN: [name] Primary Care [name]: [name] Other Clinician: Referring [name]: [name] Treating [name]/Extender: [name] in Treatment: 5 Visit Information History Since Last Visit All ordered tests and consults were completed: No Patient Arrived: [name] Added or deleted any medications: No Arrival Time: 12:33 Any new allergies or adverse reactions: No Accompanied By: daughter Had a fall or experienced change in No Transfer Assistance: None activities of daily living that may affect Patient Identification Verified: Yes risk of falls: Secondary Verification Process Completed: Yes Signs or symptoms of abuse/neglect since last visito No Patient Requires Transmission-Based Precautions: No Hospitalized since last visit: No Patient Has Alerts: No Implantable device outside of the clinic excluding No cellular tissue based products placed in the center since last visit: Has Dressing in Place as Prescribed: Yes Pain Present Now: No Electronic Signature(s) Signed: 09/24/2021 1:44:34 PM By: Derrick Coria RN Entered By: [name] on 09/10/2021 12:40:05 [name] ([id number]) -------------------------------------------------------------------------------- Clinic Level of Care Assessment Details Patient Name: [name], [name]. Date of Service: 09/10/2021 12:30 PM Medical Record Number: [id number] Patient Account Number: [id number] Date of Birth/Sex: Feb 18, 1961 (60 y.o. M) Treating RN: [name] Primary Care Tallyn Holroyd: [name] Other Clinician: Referring [name]: [name] Treating [name]/Extender: [name] in Treatment: 5 Clinic Level of Care Assessment  Items TOOL 1 Quantity Score '[]'  - Use when EandM and Procedure is performed on INITIAL visit 0 ASSESSMENTS - Nursing Assessment / Reassessment '[]'  - General Physical Exam (combine w/ comprehensive assessment (listed just below) when performed on new 0 pt. evals) '[]'  - 0 Comprehensive Assessment (HX, ROS, Risk Assessments, Wounds Hx, etc.) ASSESSMENTS - Wound and Skin Assessment / Reassessment '[]'  - Dermatologic / Skin Assessment (not related to wound area) 0 ASSESSMENTS - Ostomy and/or Continence Assessment and Care '[]'  - Incontinence Assessment and Management 0 '[]'  - 0 Ostomy Care Assessment and Management (repouching, etc.) PROCESS - Coordination of Care '[]'  - Simple Patient / Family Education for ongoing care 0 '[]'  - 0 Complex (extensive) Patient / Family Education for ongoing care '[]'  - 0 Staff obtains Programmer, systems, Records, Test Results / Process Orders '[]'  - 0 Staff telephones HHA, Nursing Homes / Clarify orders / etc '[]'  - 0 Routine Transfer to another Facility (non-emergent condition) '[]'  - 0 Routine Hospital Admission (non-emergent condition) '[]'  - 0 New Admissions / Biomedical engineer / Ordering NPWT, Apligraf, etc. '[]'  - 0 Emergency Hospital Admission (emergent condition) PROCESS - Special Needs '[]'  - Pediatric / Minor Patient Management 0 '[]'  - 0 Isolation Patient Management '[]'  - 0 Hearing / Language / Visual special needs '[]'  - 0 Assessment of Community assistance (transportation, D/C planning, etc.) '[]'  - 0 Additional assistance / Altered mentation '[]'  - 0 Support Surface(s) Assessment (bed, cushion, seat, etc.) INTERVENTIONS - Miscellaneous '[]'  - External ear exam 0 '[]'  - 0 Patient Transfer (multiple staff / Civil Service fast streamer / Similar devices) '[]'  - 0 Simple Staple / Suture removal (25 or less) '[]'  - 0 Complex Staple / Suture removal (26 or more) '[]'  - 0 Hypo/Hyperglycemic Management (do not check if billed separately) '[]'  - 0 Ankle / Brachial Index (ABI) - do not check if  billed separately Has the patient been seen at the hospital within the last three years: Yes Total Score: 0 Level  Of Care: ____ [name] ([id number]) Electronic Signature(s) Signed: 09/24/2021 1:44:34 PM By: Derrick Coria RN Entered By: [name] on 09/10/2021 13:03:32 [name], [name] ([id number]) -------------------------------------------------------------------------------- Encounter Discharge Information Details Patient Name: [name], [name]. Date of Service: 09/10/2021 12:30 PM Medical Record Number: [id number] Patient Account Number: [id number] Date of Birth/Sex: 1961/06/18 (60 y.o. M) Treating RN: [name] Primary Care [name]: [name] Other Clinician: Referring [name]: [name] Treating [name]/Extender: [name] in Treatment: 5 Encounter Discharge Information Items Post Procedure Vitals Discharge Condition: Stable Temperature (F): 98 Ambulatory Status: Ambulatory Pulse (bpm): 83 Discharge Destination: Home Respiratory Rate (breaths/min): 18 Transportation: Private Auto Blood Pressure (mmHg): 127/79 Accompanied By: daughter Schedule Follow-up Appointment: Yes Clinical Summary of Care: Patient Declined Electronic Signature(s) Signed: 09/24/2021 1:44:34 PM By: Derrick Coria RN Entered By: [name] on 09/10/2021 13:04:55 [name] ([id number]) -------------------------------------------------------------------------------- Lower Extremity Assessment Details Patient Name: [name], [name]. Date of Service: 09/10/2021 12:30 PM Medical Record Number: [id number] Patient Account Number: [id number] Date of Birth/Sex: Oct 05, 1961 (60 y.o. M) Treating RN: [name] Primary Care Kyeshia Zinn: [name] Other Clinician: Referring [name]: [name] Treating [name]/Extender: [name] in Treatment: 5 Edema Assessment Assessed: [Left: No] [Right: No] Edema: [Left: Yes] [Right: Yes] Calf Left: Right: Point of  Measurement: 32 cm From Medial Instep 33 cm 33 cm Ankle Left: Right: Point of Measurement: 10 cm From Medial Instep 21 cm 21 cm Vascular Assessment Pulses: Dorsalis Pedis Palpable: [Left:Yes] [Right:Yes] Electronic Signature(s) Signed: 09/24/2021 1:44:34 PM By: Derrick Coria RN Entered By: [name] on 09/10/2021 12:52:02 [name] ([id number]) -------------------------------------------------------------------------------- Multi Wound Chart Details Patient Name: [name]. Date of Service: 09/10/2021 12:30 PM Medical Record Number: [id number] Patient Account Number: [id number] Date of Birth/Sex: 1961-06-27 (60 y.o. 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/Ulcer of the Lower Diabetic Wound/Ulcer of the Lower Extremity Extremity Extremity Comorbid History: Coronary Artery Disease, Coronary Artery Disease, Coronary Artery Disease, Hypertension, Myocardial Infarction, Hypertension, Myocardial Infarction, Hypertension, Myocardial Infarction, Type II Diabetes Type II Diabetes Type II Diabetes Date Acquired: 07/19/2021 07/19/2021 07/19/2021 Weeks of Treatment: '5 5 5 ' Wound Status: Open Open Open Measurements L x W x D (cm) 1.5x1x0.1 1x0.5x0.1 0.2x0.3x0.1 Area (cm) : 1.178 0.393 0.047 Volume (cm) : 0.118 0.039 0.005 % Reduction in Area: 89.30% 87.20% 94.00% % Reduction in Volume: 89.30% 87.30% 93.70% Classification: Grade 2 Grade 2  Grade 2 Exudate Amount: Medium Medium Medium Exudate Type: Serosanguineous Serosanguineous Serosanguineous Exudate Color: red, brown red, brown red, brown Granulation Amount: Small (1-33%) Small (1-33%) Small (1-33%) Granulation Quality: Red Red Red Necrotic Amount: Large (67-100%) Large (67-100%) Large (67-100%) Necrotic Tissue: Adherent Chelsea Eschar Exposed Structures: Fat Layer (Subcutaneous Tissue): Fat Layer (Subcutaneous Tissue): Fat Layer (Subcutaneous Tissue): Yes Yes Yes Fascia: No Fascia: No Fascia: No Tendon: No Tendon: No Tendon: No Muscle: No Muscle: No Muscle: No Joint: No Joint: No Joint: No Bone: No Bone: No Bone: No Epithelialization: None None None Debridement: Debridement - Excisional Debridement - Excisional N/A Pre-procedure Verification/Time 13:00 13:00 N/A Out Taken: Tissue Debrided: Subcutaneous, Slough Subcutaneous, Slough N/A  Level: Skin/Subcutaneous Tissue Skin/Subcutaneous Tissue N/A Debridement Area (sq cm): 1.5 0.5 N/A Instrument: Curette Curette N/A Bleeding: Moderate Moderate N/A Hemostasis Achieved: Pressure Pressure N/A Procedural Pain: 0 0 N/A Post Procedural Pain: 0 0 N/A [name], [name]. ([id number]) Debridement Treatment Procedure was tolerated well Procedure was tolerated well N/A Response: Post Debridement 1.5x1x0.1 1x0.5x0.1 N/A Measurements L x W x D (cm) Post Debridement Volume: 0.118 0.039 N/A (cm) Procedures Performed: Debridement Debridement N/A Treatment Notes Wound #1 (Lower Leg) Wound Laterality: Right, Posterior Cleanser Byram Ancillary Kit - 15 Day Supply Discharge Instruction: Use supplies as instructed; Kit contains: (15) Saline Bullets; (15) 3x3 Gauze; 15 pr Gloves Soap and Water Discharge Instruction: Gently cleanse wound with antibacterial soap, rinse and pat dry prior to dressing wounds Peri-Wound Care Topical Primary Dressing Gauze Discharge Instruction: As directed: dry, moistened with  saline or moistened with Dakins Solution Santyl Collagenase Ointment, 30 (gm), tube Secondary Dressing Zetuvit Plus Silicone Border Dressing 4x4 (in/in) Secured With Compression Wrap Compression Stockings Add-Ons Wound #2 (Lower Leg) Wound Laterality: Right, Medial Cleanser Byram Ancillary Kit - 15 Day Supply Discharge Instruction: Use supplies as instructed; Kit contains: (15) Saline Bullets; (15) 3x3 Gauze; 15 pr Gloves Soap and Water Discharge Instruction: Gently cleanse wound with antibacterial soap, rinse and pat dry prior to dressing wounds Peri-Wound Care Topical Primary Dressing Gauze Discharge Instruction: As directed: dry, moistened with saline or moistened with Dakins Solution Santyl Collagenase Ointment, 30 (gm), tube Secondary Dressing Kerlix 4.5 x 4.1 (in/yd) Discharge Instruction: Apply Kerlix 4.5 x 4.1 (in/yd) as instructed Zetuvit Plus Silicone Border Dressing 4x4 (in/in) Secured With Compression Wrap Compression Stockings Add-Ons [name], [name] ([id number]) Wound #5 (Lower Leg) Wound Laterality: Left, Medial Cleanser Byram Ancillary Kit - 15 Day Supply Discharge Instruction: Use supplies as instructed; Kit contains: (15) Saline Bullets; (15) 3x3 Gauze; 15 pr Gloves Soap and Water Discharge Instruction: Gently cleanse wound with antibacterial soap, rinse and pat dry prior to dressing wounds Peri-Wound Care Topical Primary Dressing Gauze Discharge Instruction: As directed: dry, moistened with saline or moistened with Dakins Solution Santyl Collagenase Ointment, 30 (gm), tube Secondary Dressing Zetuvit Plus Silicone Border Dressing 4x4 (in/in) Secured With Compression Wrap Compression Stockings Add-Ons Electronic Signature(s) Signed: 09/10/2021 1:24:43 PM By: Kalman Shan DO Entered By: Kalman Shan on 09/10/2021 13:22:36 [name]  ([id number]) -------------------------------------------------------------------------------- [place] Details Patient Name: [name], [name]. Date of Service: 09/10/2021 12:30 PM Medical Record Number: [id number] Patient Account Number: [id number] Date of Birth/Sex: December 25, 1960 (60 y.o. M) Treating RN: [name] Primary Care Kristie Bracewell: [name] Other Clinician: Referring Jaret Coppedge: [name] Treating Chanti Golubski/Extender: [name] in Treatment: 5 Active Inactive Wound/Skin Impairment Nursing Diagnoses: Knowledge deficit related to ulceration/compromised skin integrity Goals: Patient/caregiver will verbalize understanding of skin care regimen Date Initiated: 08/06/2021 Date Inactivated: 08/13/2021 Target Resolution Date: 09/06/2021 Goal Status: Met Ulcer/skin breakdown will have a volume reduction of 30% by week 4 Date Initiated: 08/06/2021 Target Resolution Date: 10/06/2021 Goal Status: Active Ulcer/skin breakdown will have a volume reduction of 50% by week 8 Date Initiated: 08/06/2021 Target Resolution Date: 11/06/2021 Goal Status: Active Ulcer/skin breakdown will have a volume reduction of 80% by week 12 Date Initiated: 08/06/2021 Target Resolution Date: 12/07/2021 Goal Status: Active Ulcer/skin breakdown will heal within 14 weeks Date Initiated: 08/06/2021 Target Resolution Date: 01/04/2022 Goal Status: Active Interventions: Assess patient/caregiver ability to obtain necessary supplies Assess patient/caregiver ability to perform ulcer/skin care regimen upon admission and as needed Assess ulceration(s) every visit Notes: Electronic Signature(s)  Signed: 09/24/2021 1:44:34 PM By: Derrick Coria RN Entered By: [name] on 09/10/2021 12:58:59 [name] ([id number]) -------------------------------------------------------------------------------- Pain Assessment Details Patient Name: [name], [name]. Date of Service: 09/10/2021  12:30 PM Medical Record Number: [id number] Patient Account Number: [id number] Date of Birth/Sex: 1961-03-15 (60 y.o. M) Treating RN: [name] Primary Care Horacio Werth: [name] Other Clinician: Referring Addie Cederberg: [name] Treating [name]/Extender: [name] in Treatment: 5 Active Problems Location of Pain Severity and Description of Pain Patient Has Paino No Site Locations Pain Management and Medication Current Pain Management: Electronic Signature(s) Signed: 09/24/2021 1:44:34 PM By: Derrick Coria RN Entered By: [name] on 09/10/2021 12:40:47 [name] ([id number]) -------------------------------------------------------------------------------- Patient/Caregiver Education Details Patient Name: [name], [name]. Date of Service: 09/10/2021 12:30 PM Medical Record Number: [id number] Patient Account Number: [id number] Date of Birth/Gender: 09/14/1961 (60 y.o. M) Treating RN: [name] Primary Care Physician: [name] Other Clinician: Referring Physician: Karl [name] Treating Physician/Extender: [name] in Treatment: 5 Education Assessment Education Provided To: Patient Education Topics Provided Wound/Skin Impairment: Methods: Explain/Verbal Responses: State content correctly Electronic Signature(s) Signed: 09/24/2021 1:44:34 PM By: Derrick Coria RN Entered By: [name] on 09/10/2021 13:03:51 [name], BECKSTRAND ([id number]) -------------------------------------------------------------------------------- Wound Assessment Details Patient Name: DEONTREY, MASSI. Date of Service: 09/10/2021 12:30 PM Medical Record Number: [id number] Patient Account Number: [id number] Date of Birth/Sex: 01-30-1961 (60 y.o. M) Treating RN: [name] Primary Care Nasiir Monts: [name] Other Clinician: Referring Jhoselyn Ruffini: [name] Treating Artelia Game/Extender: [name] in Treatment: 5 Wound Status Wound Number: 1 Primary  Diabetic Wound/Ulcer of the Lower Extremity Etiology: Wound Location: Right, Posterior Lower Leg Wound Status: Open Wounding Event: Gradually Appeared Comorbid Coronary Artery Disease, Hypertension, Myocardial Date Acquired: 07/19/2021 History: Infarction, Type II Diabetes Weeks Of Treatment: 5 Clustered Wound: No Photos Wound Measurements Length: (cm) 1.5 Width: (cm) 1 Depth: (cm) 0.1 Area: (cm) 1.178 Volume: (cm) 0.118 % Reduction in Area: 89.3% % Reduction in Volume: 89.3% Epithelialization: None Tunneling: No Undermining: No Wound Description Classification: Grade 2 Exudate Amount: Medium Exudate Type: Serosanguineous Exudate Color: red, brown Foul Odor After Cleansing: No Slough/Fibrino Yes Wound Bed Granulation Amount: Small (1-33%) Exposed Structure Granulation Quality: Red Fascia Exposed: No Necrotic Amount: Large (67-100%) Fat Layer (Subcutaneous Tissue) Exposed: Yes Necrotic Quality: Adherent Slough Tendon Exposed: No Muscle Exposed: No Joint Exposed: No Bone Exposed: No Electronic Signature(s) Signed: 09/24/2021 1:44:34 PM By: Derrick Coria RN Entered By: [name] on 09/10/2021 12:49:30 [name] ([id number]) -------------------------------------------------------------------------------- Wound Assessment Details Patient Name: ENDI, LAGMAN. Date of Service: 09/10/2021 12:30 PM Medical Record Number: [id number] Patient Account Number: [id number] Date of Birth/Sex: [birth year] (60 y.o. M) Treating RN: [name] Primary Care Zeidy Tayag: [name] Other Clinician: Referring Dezyrae Kensinger: [name] Treating Tamarius Rosenfield/Extender: [name] in Treatment: 5 Wound Status Wound Number: 2 Primary Diabetic Wound/Ulcer of the Lower Extremity Etiology: Wound Location: Right, Medial Lower Leg Wound Status: Open Wounding Event: Gradually Appeared Comorbid Coronary Artery Disease, Hypertension, Myocardial Date Acquired: 07/19/2021 History:  Infarction, Type II Diabetes Weeks Of Treatment: 5 Clustered Wound: No Photos Wound Measurements Length: (cm) 1 Width: (cm) 0.5 Depth: (cm) 0.1 Area: (cm) 0.393 Volume: (cm) 0.039 % Reduction in Area: 87.2% % Reduction in Volume: 87.3% Epithelialization: None Tunneling: No Undermining: No Wound Description Classification: Grade 2 Exudate Amount: Medium Exudate Type: Serosanguineous Exudate Color: red, brown Foul Odor After Cleansing: No Slough/Fibrino Yes Wound Bed Granulation Amount: Small (1-33%) Exposed Structure Granulation Quality: Red Fascia Exposed: No Necrotic Amount: Large (  67-100%) Fat Layer (Subcutaneous Tissue) Exposed: Yes Necrotic Quality: Adherent Slough Tendon Exposed: No Muscle Exposed: No Joint Exposed: No Bone Exposed: No Electronic Signature(s) Signed: 09/24/2021 1:44:34 PM By: Derrick Coria RN Entered By: [name] on 09/10/2021 12:50:26 [name] ([id number]) -------------------------------------------------------------------------------- Wound Assessment Details Patient Name: DARRAN, GABAY. Date of Service: 09/10/2021 12:30 PM Medical Record Number: [id number] Patient Account Number: [id number] Date of Birth/Sex: 09/08/1961 (60 y.o. M) Treating RN: [name] Primary Care Nakoma Gotwalt: [name] Other Clinician: Referring Latitia Housewright: [name] Treating Earlisha Sharples/Extender: [name] in Treatment: 5 Wound Status Wound Number: 5 Primary Diabetic Wound/Ulcer of the Lower Extremity Etiology: Wound Location: Left, Medial Lower Leg Wound Status: Open Wounding Event: Gradually Appeared Comorbid Coronary Artery Disease, Hypertension, Myocardial Date Acquired: 07/19/2021 History: Infarction, Type II Diabetes Weeks Of Treatment: 5 Clustered Wound: No Photos Wound Measurements Length: (cm) 0.2 Width: (cm) 0.3 Depth: (cm) 0.1 Area: (cm) 0.047 Volume: (cm) 0.005 % Reduction in Area: 94% % Reduction in Volume:  93.7% Epithelialization: None Tunneling: No Undermining: No Wound Description Classification: Grade 2 Exudate Amount: Medium Exudate Type: Serosanguineous Exudate Color: red, brown Foul Odor After Cleansing: No Slough/Fibrino Yes Wound Bed Granulation Amount: Small (1-33%) Exposed Structure Granulation Quality: Red Fascia Exposed: No Necrotic Amount: Large (67-100%) Fat Layer (Subcutaneous Tissue) Exposed: Yes Necrotic Quality: Eschar Tendon Exposed: No Muscle Exposed: No Joint Exposed: No Bone Exposed: No Electronic Signature(s) Signed: 09/24/2021 1:44:34 PM By: Derrick Coria RN Entered By: [name] on 09/10/2021 12:50:51 [name] ([id number]) -------------------------------------------------------------------------------- Vitals Details Patient Name: [name]. Date of Service: 09/10/2021 12:30 PM Medical Record Number: [id number] Patient Account Number: [id number] Date of Birth/Sex: [birth year] (60 y.o. M) Treating RN: [name] Primary Care Bronsyn Shappell: [name] Other Clinician: Referring Donnette Macmullen: [name] Treating Zahirah Cheslock/Extender: [name] in Treatment: 5 Vital Signs Time Taken: 12:40 Temperature (F): 98 Height (in): 65 Pulse (bpm): 83 Weight (lbs): 180 Respiratory Rate (breaths/min): 18 Body Mass Index (BMI): 30 Blood Pressure (mmHg): 127/79 Reference Range: 80 - 120 mg / dl Electronic Signature(s) Signed: 09/24/2021 1:44:34 PM By: Derrick Coria RN Entered By: [name] on 09/10/2021 12:40:36

## 2021-09-24 NOTE — Progress Notes (Signed)
Derrick, [name] ([id number]) Visit Report for 09/10/2021 Chief Complaint Document Details Patient Name: [name], [name]. Date of Service: 09/10/2021 12:30 PM Medical Record Number: [id number] Patient Account Number: [id number] Date of Birth/Sex: 1960-11-11 (60 y.o. M) Treating RN: [name] Primary Care Provider: Karl [name] Other Clinician: Referring Provider: Karl [name] Treating Provider/Extender: [name] in Treatment: 5 Information Obtained from: Patient Chief Complaint Bilateral lower extremity wounds Electronic Signature(s) Signed: 09/10/2021 1:24:43 PM By: Derrick Shan DO Entered By: [name] on 09/10/2021 13:22:49 [name] ([id number]) -------------------------------------------------------------------------------- Debridement Details Patient Name: [name], [name]. Date of Service: 09/10/2021 12:30 PM Medical Record Number: [id number] Patient Account Number: [id number] Date of Birth/Sex: 1961/09/04 (59 y.o. M) Treating RN: [name] Primary Care Provider: Karl [name] Other Clinician: Referring Provider: Karl [name] Treating Provider/Extender: [name] in Treatment: 5 Debridement Performed for Wound #1 Right,Posterior Lower Leg Assessment: Performed By: Physician Derrick Shan, MD Debridement Type: Debridement Severity of Tissue Pre Debridement: Fat layer exposed Level of Consciousness (Pre- Awake and Alert procedure): Pre-procedure Verification/Time Out Yes - 13:00 Taken: Start Time: 13:00 Total Area Debrided (L x W): 1.5 (cm) x 1 (cm) = 1.5 (cm) Tissue and other material Viable, Non-Viable, Slough, Subcutaneous, Skin: Dermis , Skin: Epidermis, Slough debrided: Level: Skin/Subcutaneous Tissue Debridement Description: Excisional Instrument: Curette Bleeding: Moderate Hemostasis Achieved: Pressure End Time: 13:03 Procedural Pain: 0 Post Procedural Pain: 0 Response to Treatment: Procedure was  tolerated well Level of Consciousness (Post- Awake and Alert procedure): Post Debridement Measurements of Total Wound Length: (cm) 1.5 Width: (cm) 1 Depth: (cm) 0.1 Volume: (cm) 0.118 Character of Wound/Ulcer Post Debridement: Improved Severity of Tissue Post Debridement: Fat layer exposed Post Procedure Diagnosis Same as Pre-procedure Electronic Signature(s) Signed: 09/10/2021 1:24:43 PM By: Derrick Shan DO Signed: 09/24/2021 1:44:34 PM By: Derrick Coria RN Entered By: [name] on 09/10/2021 13:02:17 [name] ([id number]) -------------------------------------------------------------------------------- Debridement Details Patient Name: [name]. Date of Service: 09/10/2021 12:30 PM Medical Record Number: [id number] Patient Account Number: [id number] Date of Birth/Sex: [birth year] (60 y.o. M) Treating RN: [name] Primary Care Provider: Karl [name] Other Clinician: Referring Provider: Karl [name] Treating Provider/Extender: [name] in Treatment: 5 Debridement Performed for Wound #2 Right,Medial Lower Leg Assessment: Performed By: Physician Derrick Shan, MD Debridement Type: Debridement Severity of Tissue Pre Debridement: Fat layer exposed Level of Consciousness (Pre- Awake and Alert procedure): Pre-procedure Verification/Time Out Yes - 13:00 Taken: Start Time: 13:00 Total Area Debrided (L x W): 1 (cm) x 0.5 (cm) = 0.5 (cm) Tissue and other material Viable, Non-Viable, Slough, Subcutaneous, Skin: Dermis , Skin: Epidermis, Slough debrided: Level: Skin/Subcutaneous Tissue Debridement Description: Excisional Instrument: Curette Bleeding: Moderate Hemostasis Achieved: Pressure End Time: 13:03 Procedural Pain: 0 Post Procedural Pain: 0 Response to Treatment: Procedure was tolerated well Level of Consciousness (Post- Awake and Alert procedure): Post Debridement Measurements of Total Wound Length: (cm) 1 Width: (cm)  0.5 Depth: (cm) 0.1 Volume: (cm) 0.039 Character of Wound/Ulcer Post Debridement: Improved Severity of Tissue Post Debridement: Fat layer exposed Post Procedure Diagnosis Same as Pre-procedure Electronic Signature(s) Signed: 09/10/2021 1:24:43 PM By: Derrick Shan DO Signed: 09/24/2021 1:44:34 PM By: Derrick Coria RN Entered By: [name] on 09/10/2021 13:02:56 [name] ([id number]) -------------------------------------------------------------------------------- HPI Details Patient Name: [name], [name]. Date of Service: 09/10/2021 12:30 PM Medical Record Number: [id number] Patient Account Number: [id number] Date of Birth/Sex: Jul 22, 1961 (60 y.o. M) Treating RN: [name] Primary Care Provider: Karl [name] Other Clinician: Referring Provider: Karl [name] Treating Provider/Extender:  [name] Weeks in Treatment: 5 History of Present Illness HPI Description: Admission 08/06/2021 Mr. [name] is a 60 year old male with a past medical history of uncontrolled insulin-dependent type 2 diabetes, current cigarette smoker, CABG, chronic systolic congestive heart failure and history of osteomyelitis of the left foot status post fifth digit amputation that presents to the clinic for a 2-week history of nonhealing wounds to his lower extremities bilaterally. He states that 1 evening he developed blisters on his legs that eventually popped and developed tightly adhered nonviable tissue. He was evaluated by his primary care office and they noted increased erythema to the periwound's and he was started on doxycycline. He is currently taking this and reports improvement in his symptoms. He denies pain. He denies drainage. 10/26; patient presents for follow-up. He was able to start [name] [facility] and has been using this for the past week. He no longer experiences pain or increased warmth or redness to the periwound's. He has no issues or complaints today. 11/2; patient presents for  follow-up. He has been using Santyl daily. He has no issues or complaints today. He denies signs of infection. 11/9; patient presents for follow-up. He continues to use Santyl daily to the wound beds. He has no issues or complaints today. He denies signs of infection. 11/16; patient presents for follow-up. He has no issues or complaints today. He continues to use Santyl. He denies signs of infection. 11/23; patient presents for follow-up. He has no issues or complaints today. He continues to use Santyl and reports improvement in wound healing. He denies signs of infection. Electronic Signature(s) Signed: 09/10/2021 1:24:43 PM By: Derrick Shan DO Entered By: [name] on 09/10/2021 13:23:13 [name] ([id number]) -------------------------------------------------------------------------------- Physical Exam Details Patient Name: [name], [name]. Date of Service: 09/10/2021 12:30 PM Medical Record Number: [id number] Patient Account Number: [id number] Date of Birth/Sex: 06/24/1961 (60 y.o. M) Treating RN: [name] Primary Care Provider: Karl [name] Other Clinician: Referring Provider: Karl [name] Treating Provider/Extender: [name] in Treatment: 5 Constitutional . Cardiovascular . Psychiatric . Notes Bilateral lower extremity wounds with granulation tissue and nonviable tissue present. No surrounding signs of infection. Electronic Signature(s) Signed: 09/10/2021 1:24:43 PM By: Derrick Shan DO Entered By: [name] on 09/10/2021 13:23:37 [name] ([id number]) -------------------------------------------------------------------------------- Physician Orders Details Patient Name: [name], [name]. Date of Service: 09/10/2021 12:30 PM Medical Record Number: [id number] Patient Account Number: [id number] Date of Birth/Sex: 07/14/1961 (60 y.o. M) Treating RN: [name] Primary Care Provider: Karl [name] Other Clinician: Referring  Provider: Karl [name] Treating Provider/Extender: [name] in Treatment: 5 Verbal / Phone Orders: No Diagnosis Coding Follow-up Appointments o Return Appointment in 1 week. Bathing/ Shower/ Hygiene o May shower; gently cleanse wound with antibacterial soap, rinse and pat dry prior to dressing wounds Edema Control - Lymphedema / Segmental Compressive Device / Other o Elevate, Exercise Daily and Avoid Standing for Long Periods of Time. o Elevate legs to the level of the heart and pump ankles as often as possible o Elevate leg(s) parallel to the floor when sitting. Wound Treatment Wound #1 - Lower Leg Wound Laterality: Right, Posterior Cleanser: Byram Ancillary Kit - 15 Day Supply (Generic) 1 x Per Day/30 Days Discharge Instructions: Use supplies as instructed; Kit contains: (15) Saline Bullets; (15) 3x3 Gauze; 15 pr Gloves Cleanser: Soap and Water 1 x Per Day/30 Days Discharge Instructions: Gently cleanse wound with antibacterial soap, rinse and pat dry prior to dressing wounds Primary Dressing: Gauze (Generic) 1 x Per Day/30  Days Discharge Instructions: As directed: dry, moistened with saline or moistened with Dakins Solution Primary Dressing: Santyl Collagenase Ointment, 30 (gm), tube 1 x Per Day/30 Days Secondary Dressing: Zetuvit Plus Silicone Border Dressing 4x4 (in/in) (Generic) 1 x Per Day/30 Days Wound #2 - Lower Leg Wound Laterality: Right, Medial Cleanser: Byram Ancillary Kit - 15 Day Supply (Generic) 1 x Per Day/30 Days Discharge Instructions: Use supplies as instructed; Kit contains: (15) Saline Bullets; (15) 3x3 Gauze; 15 pr Gloves Cleanser: Soap and Water 1 x Per Day/30 Days Discharge Instructions: Gently cleanse wound with antibacterial soap, rinse and pat dry prior to dressing wounds Primary Dressing: Gauze (Generic) 1 x Per Day/30 Days Discharge Instructions: As directed: dry, moistened with saline or moistened with Dakins Solution Primary  Dressing: Santyl Collagenase Ointment, 30 (gm), tube 1 x Per Day/30 Days Secondary Dressing: Kerlix 4.5 x 4.1 (in/yd) 1 x Per Day/30 Days Discharge Instructions: Apply Kerlix 4.5 x 4.1 (in/yd) as instructed Secondary Dressing: Zetuvit Plus Silicone Border Dressing 4x4 (in/in) (Generic) 1 x Per Day/30 Days Wound #5 - Lower Leg Wound Laterality: Left, Medial Cleanser: Byram Ancillary Kit - 15 Day Supply (Generic) 1 x Per Day/30 Days Discharge Instructions: Use supplies as instructed; Kit contains: (15) Saline Bullets; (15) 3x3 Gauze; 15 pr Gloves Cleanser: Soap and Water 1 x Per Day/30 Days Discharge Instructions: Gently cleanse wound with antibacterial soap, rinse and pat dry prior to dressing wounds Primary Dressing: Gauze (Generic) 1 x Per Day/30 Days Discharge Instructions: As directed: dry, moistened with saline or moistened with Dakins Solution Primary Dressing: Santyl Collagenase Ointment, 30 (gm), tube 1 x Per Day/30 Days GREGOIRE, BENNIS ([id number]) Secondary Dressing: Zetuvit Plus Silicone Border Dressing 4x4 (in/in) (Generic) 1 x Per Day/30 Days Electronic Signature(s) Signed: 09/10/2021 1:24:43 PM By: Derrick Shan DO Signed: 09/24/2021 1:44:34 PM By: Derrick Coria RN Entered By: [name] on 09/10/2021 13:03:21 [name] ([id number]) -------------------------------------------------------------------------------- Problem List Details Patient Name: [name], [name]. Date of Service: 09/10/2021 12:30 PM Medical Record Number: [id number] Patient Account Number: [id number] Date of Birth/Sex: 1961-01-09 (60 y.o. M) Treating RN: [name] Primary Care Provider: Karl [name] Other Clinician: Referring Provider: Karl [name] Treating Provider/Extender: [name] in Treatment: 5 Active Problems ICD-10 Encounter Code Description Active Date MDM Diagnosis L97.919 Non-pressure chronic ulcer of unspecified part of right lower leg with 08/06/2021 No  Yes unspecified severity L97.829 Non-pressure chronic ulcer of other part of left lower leg with 08/06/2021 No Yes unspecified severity I10 Essential (primary) hypertension 08/06/2021 No Yes E31.54 Chronic systolic (congestive) heart failure 08/06/2021 No Yes I25.119 Atherosclerotic heart disease of native coronary artery with unspecified 08/06/2021 No Yes angina pectoris Z95.1 Presence of aortocoronary bypass graft 08/06/2021 No Yes E11.622 Type 2 diabetes mellitus with other skin ulcer 08/27/2021 No Yes Inactive Problems Resolved Problems Electronic Signature(s) Signed: 09/10/2021 1:24:43 PM By: Derrick Shan DO Entered By: [name] on 09/10/2021 13:22:31 [name] ([id number]) -------------------------------------------------------------------------------- Progress Note Details Patient Name: [name]. Date of Service: 09/10/2021 12:30 PM Medical Record Number: [id number] Patient Account Number: [id number] Date of Birth/Sex: 1960/11/04 (60 y.o. M) Treating RN: [name] Primary Care Provider: Karl [name] Other Clinician: Referring Provider: Karl [name] Treating Provider/Extender: [name] in Treatment: 5 Subjective Chief Complaint Information obtained from Patient Bilateral lower extremity wounds History of Present Illness (HPI) Admission 08/06/2021 Mr. Isadore Palecek is a 60 year old male with a past medical history of uncontrolled insulin-dependent type 2 diabetes, current cigarette smoker, CABG, chronic systolic congestive heart  failure and history of osteomyelitis of the left foot status post fifth digit amputation that presents to the clinic for a 2-week history of nonhealing wounds to his lower extremities bilaterally. He states that 1 evening he developed blisters on his legs that eventually popped and developed tightly adhered nonviable tissue. He was evaluated by his primary care office and they noted increased erythema to the  periwound's and he was started on doxycycline. He is currently taking this and reports improvement in his symptoms. He denies pain. He denies drainage. 10/26; patient presents for follow-up. He was able to start [facility] and has been using this for the past week. He no longer experiences pain or increased warmth or redness to the periwound's. He has no issues or complaints today. 11/2; patient presents for follow-up. He has been using Santyl daily. He has no issues or complaints today. He denies signs of infection. 11/9; patient presents for follow-up. He continues to use Santyl daily to the wound beds. He has no issues or complaints today. He denies signs of infection. 11/16; patient presents for follow-up. He has no issues or complaints today. He continues to use Santyl. He denies signs of infection. 11/23; patient presents for follow-up. He has no issues or complaints today. He continues to use Santyl and reports improvement in wound healing. He denies signs of infection. Patient History Information obtained from Patient. Social History Former smoker, Marital Status - Separated, Alcohol Use - Never, Drug Use - No History, Caffeine Use - Daily. Medical History Cardiovascular Patient has history of Coronary Artery Disease, Hypertension, Myocardial Infarction Endocrine Patient has history of Type II Diabetes Objective Constitutional Vitals Time Taken: 12:40 PM, Height: 65 in, Weight: 180 lbs, BMI: 30, Temperature: 98 F, Pulse: 83 bpm, Respiratory Rate: 18 breaths/min, Blood Pressure: 127/79 mmHg. General Notes: Bilateral lower extremity wounds with granulation tissue and nonviable tissue present. No surrounding signs of infection. Integumentary (Hair, Skin) Wound #1 status is Open. Original cause of wound was Gradually Appeared. The date acquired was: 07/19/2021. The wound has been in treatment 5 weeks. The wound is located on the Right,Posterior Lower Leg. The wound measures 1.5cm length x  1cm width x 0.1cm depth; 1.178cm^2 area and 0.118cm^3 volume. There is Fat Layer (Subcutaneous Tissue) exposed. There is no tunneling or undermining noted. There is a medium [name], [name]. ([id number]) amount of serosanguineous drainage noted. There is small (1-33%) red granulation within the wound bed. There is a large (67-100%) amount of necrotic tissue within the wound bed including Adherent Slough. Wound #2 status is Open. Original cause of wound was Gradually Appeared. The date acquired was: 07/19/2021. The wound has been in treatment 5 weeks. The wound is located on the Right,Medial Lower Leg. The wound measures 1cm length x 0.5cm width x 0.1cm depth; 0.393cm^2 area and 0.039cm^3 volume. There is Fat Layer (Subcutaneous Tissue) exposed. There is no tunneling or undermining noted. There is a medium amount of serosanguineous drainage noted. There is small (1-33%) red granulation within the wound bed. There is a large (67-100%) amount of necrotic tissue within the wound bed including Adherent Slough. Wound #5 status is Open. Original cause of wound was Gradually Appeared. The date acquired was: 07/19/2021. The wound has been in treatment 5 weeks. The wound is located on the Left,Medial Lower Leg. The wound measures 0.2cm length x 0.3cm width x 0.1cm depth; 0.047cm^2 area and 0.005cm^3 volume. There is Fat Layer (Subcutaneous Tissue) exposed. There is no tunneling or undermining noted. There is a medium  amount of serosanguineous drainage noted. There is small (1-33%) red granulation within the wound bed. There is a large (67-100%) amount of necrotic tissue within the wound bed including Eschar. Assessment Active Problems ICD-10 Non-pressure chronic ulcer of unspecified part of right lower leg with unspecified severity Non-pressure chronic ulcer of other part of left lower leg with unspecified severity Essential (primary) hypertension Chronic systolic (congestive) heart failure Atherosclerotic  heart disease of native coronary artery with unspecified angina pectoris Presence of aortocoronary bypass graft Type 2 diabetes mellitus with other skin ulcer Patient's wounds continue to show improvement in size and appearance. I debrided nonviable tissue. No signs of infection on exam. I recommended continuing Santyl. Follow-up in 1 week. Procedures Wound #1 Pre-procedure diagnosis of Wound #1 is a Diabetic Wound/Ulcer of the Lower Extremity located on the Right,Posterior Lower Leg .Severity of Tissue Pre Debridement is: Fat layer exposed. There was a Excisional Skin/Subcutaneous Tissue Debridement with a total area of 1.5 sq cm performed by Derrick Shan, MD. With the following instrument(s): Curette to remove Viable and Non-Viable tissue/material. Material removed includes Subcutaneous Tissue, Slough, Skin: Dermis, and Skin: Epidermis. No specimens were taken. A time out was conducted at 13:00, prior to the start of the procedure. A Moderate amount of bleeding was controlled with Pressure. The procedure was tolerated well with a pain level of 0 throughout and a pain level of 0 following the procedure. Post Debridement Measurements: 1.5cm length x 1cm width x 0.1cm depth; 0.118cm^3 volume. Character of Wound/Ulcer Post Debridement is improved. Severity of Tissue Post Debridement is: Fat layer exposed. Post procedure Diagnosis Wound #1: Same as Pre-Procedure Wound #2 Pre-procedure diagnosis of Wound #2 is a Diabetic Wound/Ulcer of the Lower Extremity located on the Right,Medial Lower Leg .Severity of Tissue Pre Debridement is: Fat layer exposed. There was a Excisional Skin/Subcutaneous Tissue Debridement with a total area of 0.5 sq cm performed by Derrick Shan, MD. With the following instrument(s): Curette to remove Viable and Non-Viable tissue/material. Material removed includes Subcutaneous Tissue, Slough, Skin: Dermis, and Skin: Epidermis. No specimens were taken. A time out was  conducted at 13:00, prior to the start of the procedure. A Moderate amount of bleeding was controlled with Pressure. The procedure was tolerated well with a pain level of 0 throughout and a pain level of 0 following the procedure. Post Debridement Measurements: 1cm length x 0.5cm width x 0.1cm depth; 0.039cm^3 volume. Character of Wound/Ulcer Post Debridement is improved. Severity of Tissue Post Debridement is: Fat layer exposed. Post procedure Diagnosis Wound #2: Same as Pre-Procedure Plan Follow-up Appointments: Return Appointment in 1 week. Bathing/ Shower/ Hygiene: May shower; gently cleanse wound with antibacterial soap, rinse and pat dry prior to dressing wounds [name], [name] ([id number]) Edema Control - Lymphedema / Segmental Compressive Device / Other: Elevate, Exercise Daily and Avoid Standing for Long Periods of Time. Elevate legs to the level of the heart and pump ankles as often as possible Elevate leg(s) parallel to the floor when sitting. WOUND #1: - Lower Leg Wound Laterality: Right, Posterior Cleanser: Byram Ancillary Kit - 15 Day Supply (Generic) 1 x Per Day/30 Days Discharge Instructions: Use supplies as instructed; Kit contains: (15) Saline Bullets; (15) 3x3 Gauze; 15 pr Gloves Cleanser: Soap and Water 1 x Per Day/30 Days Discharge Instructions: Gently cleanse wound with antibacterial soap, rinse and pat dry prior to dressing wounds Primary Dressing: Gauze (Generic) 1 x Per Day/30 Days Discharge Instructions: As directed: dry, moistened with saline or moistened with Dakins Solution Primary  Dressing: Santyl Collagenase Ointment, 30 (gm), tube 1 x Per Day/30 Days Secondary Dressing: Zetuvit Plus Silicone Border Dressing 4x4 (in/in) (Generic) 1 x Per Day/30 Days WOUND #2: - Lower Leg Wound Laterality: Right, Medial Cleanser: Byram Ancillary Kit - 15 Day Supply (Generic) 1 x Per Day/30 Days Discharge Instructions: Use supplies as instructed; Kit contains: (15) Saline  Bullets; (15) 3x3 Gauze; 15 pr Gloves Cleanser: Soap and Water 1 x Per Day/30 Days Discharge Instructions: Gently cleanse wound with antibacterial soap, rinse and pat dry prior to dressing wounds Primary Dressing: Gauze (Generic) 1 x Per Day/30 Days Discharge Instructions: As directed: dry, moistened with saline or moistened with Dakins Solution Primary Dressing: Santyl Collagenase Ointment, 30 (gm), tube 1 x Per Day/30 Days Secondary Dressing: Kerlix 4.5 x 4.1 (in/yd) 1 x Per Day/30 Days Discharge Instructions: Apply Kerlix 4.5 x 4.1 (in/yd) as instructed Secondary Dressing: Zetuvit Plus Silicone Border Dressing 4x4 (in/in) (Generic) 1 x Per Day/30 Days WOUND #5: - Lower Leg Wound Laterality: Left, Medial Cleanser: Byram Ancillary Kit - 15 Day Supply (Generic) 1 x Per Day/30 Days Discharge Instructions: Use supplies as instructed; Kit contains: (15) Saline Bullets; (15) 3x3 Gauze; 15 pr Gloves Cleanser: Soap and Water 1 x Per Day/30 Days Discharge Instructions: Gently cleanse wound with antibacterial soap, rinse and pat dry prior to dressing wounds Primary Dressing: Gauze (Generic) 1 x Per Day/30 Days Discharge Instructions: As directed: dry, moistened with saline or moistened with Dakins Solution Primary Dressing: Santyl Collagenase Ointment, 30 (gm), tube 1 x Per Day/30 Days Secondary Dressing: Zetuvit Plus Silicone Border Dressing 4x4 (in/in) (Generic) 1 x Per Day/30 Days 1. In office sharp debridement 2. Santyl 3. Follow-up in 1 week Electronic Signature(s) Signed: 09/10/2021 1:24:43 PM By: Derrick Shan DO Entered By: [name] on 09/10/2021 13:24:10 [name] ([id number]) -------------------------------------------------------------------------------- ROS/PFSH Details Patient Name: [name], [name]. Date of Service: 09/10/2021 12:30 PM Medical Record Number: [id number] Patient Account Number: [id number] Date of Birth/Sex: 01-13-1961 (60 y.o. M) Treating RN: [name] Primary Care Provider: Karl [name] Other Clinician: Referring Provider: Karl [name] Treating Provider/Extender: [name] in Treatment: 5 Information Obtained From Patient Cardiovascular Medical History: Positive for: Coronary Artery Disease; Hypertension; Myocardial Infarction Endocrine Medical History: Positive for: Type II Diabetes Time with diabetes: 7 years Treated with: Oral agents Blood sugar tested every day: Yes Tested : Immunizations Pneumococcal Vaccine: Received Pneumococcal Vaccination: No Implantable Devices None Family and Social History Former smoker; Marital Status - Separated; Alcohol Use: Never; Drug Use: No History; Caffeine Use: Daily; Financial Concerns: No; Food, Clothing or Shelter Needs: No; Support System Lacking: No; Transportation Concerns: No Electronic Signature(s) Signed: 09/10/2021 1:24:43 PM By: Derrick Shan DO Signed: 09/24/2021 1:44:34 PM By: Derrick Coria RN Entered By: [name] on 09/10/2021 13:23:19 [name], [name] ([id number]) -------------------------------------------------------------------------------- [facility] Details Patient Name: [name], [name]. Date of Service: 09/10/2021 Medical Record Number: [id number] Patient Account Number: [id number] Date of Birth/Sex: 04/14/1961 (60 y.o. 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 L97.829 Non-pressure chronic ulcer of other part of left lower leg with unspecified severity I10 Essential (primary) hypertension D63.87 Chronic systolic (congestive) heart failure I25.119 Atherosclerotic heart disease of native coronary artery with unspecified angina pectoris Z95.1 Presence of  aortocoronary bypass graft E11.622 Type 2 diabetes mellitus with  other skin ulcer Facility Procedures CPT4 Code: 64660563 Description: 72942 - DEB SUBQ TISSUE 20 SQ CM/< Modifier: Quantity: 1 CPT4 Code: Description: ICD-10 Diagnosis Description I27.004 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

## 2021-09-24 NOTE — Progress Notes (Signed)
KREED, KAUFFMAN ([id number]) Visit Report for 09/24/2021 Arrival Information Details Patient Name: REMIGIO, [name]. Date of Service: 09/24/2021 12:30 PM Medical Record Number: [id number] Patient Account Number: [id number] Date of Birth/Sex: 08-07-1961 (60 y.o. M) Treating RN: [name] Primary Care [name]: [name] Other Clinician: Referring [name]: [name] Treating [name]/Extender: [name] in Treatment: 7 Visit Information History Since Last Visit All ordered tests and consults were completed: No Patient Arrived: Ambulatory Added or deleted any medications: No Arrival Time: 12:35 Any new allergies or adverse reactions: No Accompanied By: daughter Had a fall or experienced change in No Transfer Assistance: None activities of daily living that may affect Patient Identification Verified: Yes risk of falls: Secondary Verification Process Completed: Yes Signs or symptoms of abuse/neglect since last visito No Patient Requires Transmission-Based Precautions: No Hospitalized since last visit: No Patient Has Alerts: No Implantable device outside of the clinic excluding No cellular tissue based products placed in the center since last visit: Has Dressing in Place as Prescribed: Yes Pain Present Now: No Electronic Signature(s) Signed: 09/24/2021 1:43:57 PM By: Derrick Coria RN Entered By: [name] on 09/24/2021 12:40:41 [name] ([id number]) -------------------------------------------------------------------------------- Clinic Level of Care Assessment Details Patient Name: [name], [name]. Date of Service: 09/24/2021 12:30 PM Medical Record Number: [id number] Patient Account Number: [id number] Date of Birth/Sex: 11-09-1960 (60 y.o. M) Treating RN: [name] Primary Care [name]: [name] Other Clinician: Referring [name]: [name] Treating Yakub Lodes/Extender: [name] in Treatment: 7 Clinic Level of Care Assessment  Items TOOL 1 Quantity Score '[]'  - Use when EandM and Procedure is performed on INITIAL visit 0 ASSESSMENTS - Nursing Assessment / Reassessment '[]'  - General Physical Exam (combine w/ comprehensive assessment (listed just below) when performed on new 0 pt. evals) '[]'  - 0 Comprehensive Assessment (HX, ROS, Risk Assessments, Wounds Hx, etc.) ASSESSMENTS - Wound and Skin Assessment / Reassessment '[]'  - Dermatologic / Skin Assessment (not related to wound area) 0 ASSESSMENTS - Ostomy and/or Continence Assessment and Care '[]'  - Incontinence Assessment and Management 0 '[]'  - 0 Ostomy Care Assessment and Management (repouching, etc.) PROCESS - Coordination of Care '[]'  - Simple Patient / Family Education for ongoing care 0 '[]'  - 0 Complex (extensive) Patient / Family Education for ongoing care '[]'  - 0 Staff obtains Programmer, systems, Records, Test Results / Process Orders '[]'  - 0 Staff telephones HHA, Nursing Homes / Clarify orders / etc '[]'  - 0 Routine Transfer to another Facility (non-emergent condition) '[]'  - 0 Routine Hospital Admission (non-emergent condition) '[]'  - 0 New Admissions / Biomedical engineer / Ordering NPWT, Apligraf, etc. '[]'  - 0 Emergency Hospital Admission (emergent condition) PROCESS - Special Needs '[]'  - Pediatric / Minor Patient Management 0 '[]'  - 0 Isolation Patient Management '[]'  - 0 Hearing / Language / Visual special needs '[]'  - 0 Assessment of Community assistance (transportation, D/C planning, etc.) '[]'  - 0 Additional assistance / Altered mentation '[]'  - 0 Support Surface(s) Assessment (bed, cushion, seat, etc.) INTERVENTIONS - Miscellaneous '[]'  - External ear exam 0 '[]'  - 0 Patient Transfer (multiple staff / Civil Service fast streamer / Similar devices) '[]'  - 0 Simple Staple / Suture removal (25 or less) '[]'  - 0 Complex Staple / Suture removal (26 or more) '[]'  - 0 Hypo/Hyperglycemic Management (do not check if billed separately) '[]'  - 0 Ankle / Brachial Index (ABI) - do not check if  billed separately Has the patient been seen at the hospital within the last three years: Yes Total Score: 0 Level  Of Care: ____ [name] ([id number]) Electronic Signature(s) Signed: 09/24/2021 1:43:57 PM By: Derrick Coria RN Entered By: [name] on 09/24/2021 13:14:18 [name] ([id number]) -------------------------------------------------------------------------------- Encounter Discharge Information Details Patient Name: [name], [name]. Date of Service: 09/24/2021 12:30 PM Medical Record Number: [id number] Patient Account Number: [id number] Date of Birth/Sex: 1961/02/12 (60 y.o. M) Treating RN: [name] Primary Care Suleima Ohlendorf: [name] Other Clinician: Referring [name]: [name] Treating [name]/Extender: [name] in Treatment: 7 Encounter Discharge Information Items Post Procedure Vitals Discharge Condition: Stable Temperature (F): 97.8 Ambulatory Status: Ambulatory Pulse (bpm): 81 Discharge Destination: Home Respiratory Rate (breaths/min): 18 Transportation: Private Auto Blood Pressure (mmHg): 127/81 Accompanied By: daughter Schedule Follow-up Appointment: Yes Clinical Summary of Care: Patient Declined Electronic Signature(s) Signed: 09/24/2021 1:43:57 PM By: Derrick Coria RN Entered By: [name] on 09/24/2021 13:17:29 [name] ([id number]) -------------------------------------------------------------------------------- Lower Extremity Assessment Details Patient Name: [name], [name]. Date of Service: 09/24/2021 12:30 PM Medical Record Number: [id number] Patient Account Number: [id number] Date of Birth/Sex: 1961/07/21 (60 y.o. M) Treating RN: [name] Primary Care Blake Goya: [name] Other Clinician: Referring Aulton Routt: [name] Treating [name]/Extender: [name] in Treatment: 7 Edema Assessment Assessed: [Left: No] [Right: No] Edema: [Left: Ye] [Right: s] Calf Left: Right: Point of  Measurement: 3 cm From Medial Instep 34 cm Ankle Left: Right: Point of Measurement: 10 cm From Medial Instep 21 cm Vascular Assessment Pulses: Dorsalis Pedis Palpable: [Left:Yes] Electronic Signature(s) Signed: 09/24/2021 1:43:57 PM By: Derrick Coria RN Entered By: [name] on 09/24/2021 12:46:48 [name] ([id number]) -------------------------------------------------------------------------------- Multi Wound Chart Details Patient Name: [name]. Date of Service: 09/24/2021 12:30 PM Medical Record Number: [id number] Patient Account Number: [id number] Date of Birth/Sex: 1961/09/17 (60 y.o. M) Treating RN: [name] Primary Care Kenzli Barritt: [name] Other Clinician: Referring Mitsuye Schrodt: [name] Treating Meer Reindl/Extender: [name] in Treatment: 7 Vital Signs Height(in): 65 Pulse(bpm): 81 Weight(lbs): 180 Blood Pressure(mmHg): 127/81 Body Mass Index(BMI): 30 Temperature(F): 97.8 Respiratory Rate(breaths/min): 18 Photos: [N/A:N/A] Wound Location: Right, Distal, Circumferential Foot N/A N/A Wounding Event: Thermal Burn N/A N/A Primary Etiology: 2nd degree Burn N/A N/A Comorbid History: Coronary Artery Disease, N/A N/A Hypertension, Myocardial Infarction, Type II Diabetes Date Acquired: 09/14/2021 N/A N/A Weeks of Treatment: 1 N/A N/A Wound Status: Open N/A N/A Clustered Wound: Yes N/A N/A Measurements L x W x D (cm) 4x4.5x0.1 N/A N/A Area (cm) : 14.137 N/A N/A Volume (cm) : 1.414 N/A N/A % Reduction in Area: 48.60% N/A N/A % Reduction in Volume: 48.60% N/A N/A Classification: Full Thickness Without Exposed N/A N/A Support Structures Exudate Amount: Medium N/A N/A Exudate Type: Serosanguineous N/A N/A Exudate Color: red, brown N/A N/A Granulation Amount: Medium (34-66%) N/A N/A Granulation Quality: Red N/A N/A Necrotic Amount: Medium (34-66%) N/A N/A Exposed Structures: Fat Layer (Subcutaneous Tissue): N/A N/A Yes Fascia:  No Tendon: No Muscle: No Joint: No Bone: No Epithelialization: None N/A N/A Treatment Notes Electronic Signature(s) Signed: 09/24/2021 1:43:57 PM By: Derrick Coria RN Entered By: [name] on 09/24/2021 13:10:56 [name] ([id number]) -------------------------------------------------------------------------------- Multi-Disciplinary Care Plan Details Patient Name: [name], [name]. Date of Service: 09/24/2021 12:30 PM Medical Record Number: [id number] Patient Account Number: [id number] Date of Birth/Sex: August 17, 1961 (60 y.o. M) Treating RN: [name] Primary Care Jeryn Bertoni: [name] Other Clinician: Referring Azaylah Stailey: [name] Treating Joplin Canty/Extender: [name] in Treatment: 7 Active Inactive Wound/Skin Impairment Nursing Diagnoses: Knowledge deficit related to ulceration/compromised skin integrity Goals: Patient/caregiver will verbalize understanding of skin  care regimen Date Initiated: 08/06/2021 Date Inactivated: 08/13/2021 Target Resolution Date: 09/06/2021 Goal Status: Met Ulcer/skin breakdown will have a volume reduction of 30% by week 4 Date Initiated: 08/06/2021 Target Resolution Date: 10/06/2021 Goal Status: Active Ulcer/skin breakdown will have a volume reduction of 50% by week 8 Date Initiated: 08/06/2021 Target Resolution Date: 11/06/2021 Goal Status: Active Ulcer/skin breakdown will have a volume reduction of 80% by week 12 Date Initiated: 08/06/2021 Target Resolution Date: 12/07/2021 Goal Status: Active Ulcer/skin breakdown will heal within 14 weeks Date Initiated: 08/06/2021 Target Resolution Date: 01/04/2022 Goal Status: Active Interventions: Assess patient/caregiver ability to obtain necessary supplies Assess patient/caregiver ability to perform ulcer/skin care regimen upon admission and as needed Assess ulceration(s) every visit Notes: Electronic Signature(s) Signed: 09/24/2021 1:43:57 PM By: Derrick Coria RN Entered By:  [name] on 09/24/2021 13:10:47 [name] ([id number]) -------------------------------------------------------------------------------- Pain Assessment Details Patient Name: [name], [name]. Date of Service: 09/24/2021 12:30 PM Medical Record Number: [id number] Patient Account Number: [id number] Date of Birth/Sex: [birth year] (60 y.o. M) Treating RN: [name] Primary Care Raven Furnas: [name] Other Clinician: Referring Jariel Drost: [name] Treating Shaneya Taketa/Extender: [name] in Treatment: 7 Active Problems Location of Pain Severity and Description of Pain Patient Has Paino No Site Locations Pain Management and Medication Current Pain Management: Electronic Signature(s) Signed: 09/24/2021 1:43:57 PM By: Derrick Coria RN Entered By: [name] on 09/24/2021 12:41:11 [name] ([id number]) -------------------------------------------------------------------------------- Patient/Caregiver Education Details Patient Name: [name], [name]. Date of Service: 09/24/2021 12:30 PM Medical Record Number: [id number] Patient Account Number: [id number] Date of Birth/Gender: [birth year] (60 y.o. M) Treating RN: [name] Primary Care Physician: [name] Other Clinician: Referring Physician: Karl [name] Treating Physician/Extender: [name] in Treatment: 7 Education Assessment Education Provided To: Patient Education Topics Provided Wound/Skin Impairment: Methods: Explain/Verbal Responses: State content correctly Electronic Signature(s) Signed: 09/24/2021 1:43:57 PM By: Derrick Coria RN Entered By: [name] on 09/24/2021 13:14:42 [name] ([id number]) -------------------------------------------------------------------------------- Wound Assessment Details Patient Name: [name], [name]. Date of Service: 09/24/2021 12:30 PM Medical Record Number: [id number] Patient Account Number: [id number] Date of Birth/Sex: [birth year] (60 y.o.  M) Treating RN: [name] Primary Care Keilen Kahl: [name] Other Clinician: Referring Dantae Meunier: [name] Treating Emelina Hinch/Extender: [name] in Treatment: 7 Wound Status Wound Number: 6 Primary 2nd degree Burn Etiology: Wound Location: Right, Distal, Circumferential Foot Wound Status: Open Wounding Event: Thermal Burn Comorbid Coronary Artery Disease, Hypertension, Myocardial Date Acquired: 09/14/2021 History: Infarction, Type II Diabetes Weeks Of Treatment: 1 Clustered Wound: Yes Photos Wound Measurements Length: (cm) 4 Width: (cm) 4.5 Depth: (cm) 0.1 Area: (cm) 14.137 Volume: (cm) 1.414 % Reduction in Area: 48.6% % Reduction in Volume: 48.6% Epithelialization: None Tunneling: No Undermining: No Wound Description Classification: Full Thickness Without Exposed Support Structures Exudate Amount: Medium Exudate Type: Serosanguineous Exudate Color: red, brown Foul Odor After Cleansing: No Slough/Fibrino Yes Wound Bed Granulation Amount: Medium (34-66%) Exposed Structure Granulation Quality: Red Fascia Exposed: No Necrotic Amount: Medium (34-66%) Fat Layer (Subcutaneous Tissue) Exposed: Yes Necrotic Quality: Adherent Slough Tendon Exposed: No Muscle Exposed: No Joint Exposed: No Bone Exposed: No Treatment Notes Wound #6 (Foot) Wound Laterality: Right, Distal, Circumferential Cleanser Soap and Water Discharge Instruction: Gently cleanse wound with antibacterial soap, rinse and pat dry prior to dressing wounds Peri-Wound Care [name], [name] ([id number]) Topical Primary Dressing Gauze Discharge Instruction: As directed: dry, moistened with saline or moistened with Dakins Solution Santyl Collagenase Ointment, 30 (gm), tube Secondary Dressing Secured With Compression Wrap Compression Stockings Add-Ons  Electronic Signature(s) Signed: 09/24/2021 1:43:57 PM By: Derrick Coria RN Entered By: [name] on 09/24/2021 12:45:52 [name] ([id number]) -------------------------------------------------------------------------------- Vitals Details Patient Name: [name], [name]. Date of Service: 09/24/2021 12:30 PM Medical Record Number: [id number] Patient Account Number: [id number] Date of Birth/Sex: 1961/08/08 (60 y.o. M) Treating RN: [name] Primary Care Chigozie Basaldua: [name] Other Clinician: Referring Samuell Knoble: [name] Treating Isolde Skaff/Extender: [name] in Treatment: 7 Vital Signs Time Taken: 12:40 Temperature (F): 97.8 Height (in): 65 Pulse (bpm): 81 Weight (lbs): 180 Respiratory Rate (breaths/min): 18 Body Mass Index (BMI): 30 Blood Pressure (mmHg): 127/81 Reference Range: 80 - 120 mg / dl Electronic Signature(s) Signed: 09/24/2021 1:43:57 PM By: Derrick Coria RN Entered By: [name] on 09/24/2021 12:41:02

## 2021-10-01 ENCOUNTER — Other Ambulatory Visit: Payer: Self-pay

## 2021-10-01 ENCOUNTER — Encounter (HOSPITAL_BASED_OUTPATIENT_CLINIC_OR_DEPARTMENT_OTHER): Payer: Medicare Other | Admitting: Internal Medicine

## 2021-10-01 DIAGNOSIS — L97522 Non-pressure chronic ulcer of other part of left foot with fat layer exposed: Secondary | ICD-10-CM | POA: Diagnosis not present

## 2021-10-01 DIAGNOSIS — E11622 Type 2 diabetes mellitus with other skin ulcer: Secondary | ICD-10-CM | POA: Diagnosis not present
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
## 2021-10-01 NOTE — Progress Notes (Signed)
[name] ([id number]) Visit Report for 10/01/2021 Arrival Information Details Patient Name: [name], [name]. Date of Service: 10/01/2021 12:30 PM Medical Record Number: [id number] Patient Account Number: [id number] Date of Birth/Sex: 1961-03-05 (60 y.o. M) Treating RN: Carlene Coria Primary Care Tyre Beaver: Karl Ito Other Clinician: Referring Bettina Warn: Karl Ito Treating Elham Fini/Extender: Yaakov Guthrie in Treatment: 8 Visit Information History Since Last Visit All ordered tests and consults were completed: No Patient Arrived: Ambulatory Added or deleted any medications: No Arrival Time: 12:37 Any new allergies or adverse reactions: No Accompanied By: self Had a fall or experienced change in No Transfer Assistance: None activities of daily living that may affect Patient Identification Verified: Yes risk of falls: Patient Requires Transmission-Based Precautions: No Signs or symptoms of abuse/neglect since last visito No Patient Has Alerts: No Hospitalized since last visit: No Implantable device outside of the clinic excluding No cellular tissue based products placed in the center since last visit: Has Dressing in Place as Prescribed: Yes Pain Present Now: No Electronic Signature(s) Signed: 10/01/2021 4:05:54 PM By: Carlene Coria RN Entered By: Carlene Coria on 10/01/2021 12:41:38 [name] ([id number]) -------------------------------------------------------------------------------- Clinic Level of Care Assessment Details Patient Name: [name]. Date of Service: 10/01/2021 12:30 PM Medical Record Number: [id number] Patient Account Number: [id number] Date of Birth/Sex: 03/05/1961 (60 y.o. M) Treating RN: Carlene Coria Primary Care Tylisha Danis: Karl Ito Other Clinician: Referring Sayra Frisby: Karl Ito Treating Baruch Lewers/Extender: Yaakov Guthrie in Treatment: 8 Clinic Level of Care Assessment Items TOOL 1 Quantity Score '[]'  - Use when  EandM and Procedure is performed on INITIAL visit 0 ASSESSMENTS - Nursing Assessment / Reassessment '[]'  - General Physical Exam (combine w/ comprehensive assessment (listed just below) when performed on new 0 pt. evals) '[]'  - 0 Comprehensive Assessment (HX, ROS, Risk Assessments, Wounds Hx, etc.) ASSESSMENTS - Wound and Skin Assessment / Reassessment '[]'  - Dermatologic / Skin Assessment (not related to wound area) 0 ASSESSMENTS - Ostomy and/or Continence Assessment and Care '[]'  - Incontinence Assessment and Management 0 '[]'  - 0 Ostomy Care Assessment and Management (repouching, etc.) PROCESS - Coordination of Care '[]'  - Simple Patient / Family Education for ongoing care 0 '[]'  - 0 Complex (extensive) Patient / Family Education for ongoing care '[]'  - 0 Staff obtains Programmer, systems, Records, Test Results / Process Orders '[]'  - 0 Staff telephones HHA, Nursing Homes / Clarify orders / etc '[]'  - 0 Routine Transfer to another Facility (non-emergent condition) '[]'  - 0 Routine Hospital Admission (non-emergent condition) '[]'  - 0 New Admissions / Biomedical engineer / Ordering NPWT, Apligraf, etc. '[]'  - 0 Emergency Hospital Admission (emergent condition) PROCESS - Special Needs '[]'  - Pediatric / Minor Patient Management 0 '[]'  - 0 Isolation Patient Management '[]'  - 0 Hearing / Language / Visual special needs '[]'  - 0 Assessment of Community assistance (transportation, D/C planning, etc.) '[]'  - 0 Additional assistance / Altered mentation '[]'  - 0 Support Surface(s) Assessment (bed, cushion, seat, etc.) INTERVENTIONS - Miscellaneous '[]'  - External ear exam 0 '[]'  - 0 Patient Transfer (multiple staff / Civil Service fast streamer / Similar devices) '[]'  - 0 Simple Staple / Suture removal (25 or less) '[]'  - 0 Complex Staple / Suture removal (26 or more) '[]'  - 0 Hypo/Hyperglycemic Management (do not check if billed separately) '[]'  - 0 Ankle / Brachial Index (ABI) - do not check if billed separately Has the patient been seen at  the hospital within the last three years: Yes Total Score: 0 Level Of Care: ____ Merlene Pulling  Wanda Plump ([id number]) Electronic Signature(s) Signed: 10/01/2021 4:05:54 PM By: Carlene Coria RN Entered By: Carlene Coria on 10/01/2021 12:56:20 [name] ([id number]) -------------------------------------------------------------------------------- Encounter Discharge Information Details Patient Name: [name]. Date of Service: 10/01/2021 12:30 PM Medical Record Number: [id number] Patient Account Number: [id number] Date of Birth/Sex: 06/01/1961 (60 y.o. M) Treating RN: Carlene Coria Primary Care Evelynn Hench: Karl Ito Other Clinician: Referring Amey Hossain: Karl Ito Treating Khiem Gargis/Extender: Yaakov Guthrie in Treatment: 8 Encounter Discharge Information Items Post Procedure Vitals Discharge Condition: Stable Temperature (F): 97.8 Ambulatory Status: Ambulatory Pulse (bpm): 84 Discharge Destination: Home Respiratory Rate (breaths/min): 18 Transportation: Private Auto Blood Pressure (mmHg): 169/90 Accompanied By: daughter Schedule Follow-up Appointment: Yes Clinical Summary of Care: Patient Declined Electronic Signature(s) Signed: 10/01/2021 4:05:54 PM By: Carlene Coria RN Entered By: Carlene Coria on 10/01/2021 12:57:32 [name] ([id number]) -------------------------------------------------------------------------------- Lower Extremity Assessment Details Patient Name: [name]. Date of Service: 10/01/2021 12:30 PM Medical Record Number: [id number] Patient Account Number: [id number] Date of Birth/Sex: [birth year] (60 y.o. M) Treating RN: Carlene Coria Primary Care Tyrann Donaho: Karl Ito Other Clinician: Referring Kamari Bilek: Karl Ito Treating Atwood Adcock/Extender: Yaakov Guthrie in Treatment: 8 Edema Assessment Assessed: [Left: No] [Right: No] Edema: [Left: Ye] [Right: s] Calf Left: Right: Point of Measurement: 30 cm From Medial Instep 34.5  cm Ankle Left: Right: Point of Measurement: 10 cm From Medial Instep 21 cm Vascular Assessment Pulses: Dorsalis Pedis Palpable: [Left:Yes] Electronic Signature(s) Signed: 10/01/2021 4:05:54 PM By: Carlene Coria RN Entered By: Carlene Coria on 10/01/2021 12:48:49 [name] ([id number]) -------------------------------------------------------------------------------- Multi Wound Chart Details Patient Name: [name] Date of Service: 10/01/2021 12:30 PM Medical Record Number: [id number] Patient Account Number: [id number] Date of Birth/Sex: 10-Feb-1961 (60 y.o. M) Treating RN: Carlene Coria Primary Care Dion Sibal: Karl Ito Other Clinician: Referring Oshen Wlodarczyk: Karl Ito Treating Tamer Baughman/Extender: Yaakov Guthrie in Treatment: 8 Vital Signs Height(in): 65 Pulse(bpm): 8 Weight(lbs): 180 Blood Pressure(mmHg): 169/90 Body Mass Index(BMI): 30 Temperature(F): 97.8 Respiratory Rate(breaths/min): 18 Photos: [6:No Photos] [N/A:N/A] Wound Location: [6:Right, Distal, Circumferential Foot N/A] Wounding Event: [6:Thermal Burn] [N/A:N/A] Primary Etiology: [6:2nd degree Burn] [N/A:N/A] Comorbid History: [6:Coronary Artery Disease, Hypertension, Myocardial Infarction, Type II Diabetes] [N/A:N/A] Date Acquired: [6:09/14/2021] [N/A:N/A] Weeks of Treatment: [6:2] [N/A:N/A] Wound Status: [6:Open] [N/A:N/A] Clustered Wound: [6:Yes] [N/A:N/A] Measurements L x W x D (cm) [6:3x4x0.1] [N/A:N/A] Area (cm) : [6:9.425] [N/A:N/A] Volume (cm) : [6:0.942] [N/A:N/A] % Reduction in Area: [6:65.70%] [N/A:N/A] % Reduction in Volume: [6:65.70%] [N/A:N/A] Classification: [6:Full Thickness Without Exposed Support Structures] [N/A:N/A] Exudate Amount: [6:Medium] [N/A:N/A] Exudate Type: [6:Serosanguineous] [N/A:N/A] Exudate Color: [6:red, brown] [N/A:N/A] Granulation Amount: [6:Medium (34-66%)] [N/A:N/A] Granulation Quality: [6:Red] [N/A:N/A] Necrotic Amount: [6:Medium (34-66%)]  [N/A:N/A] Exposed Structures: [6:Fat Layer (Subcutaneous Tissue): N/A Yes Fascia: No Tendon: No Muscle: No Joint: No Bone: No] Epithelialization: [6:None] [N/A:N/A] Debridement: [6:Debridement - Excisional] [N/A:N/A] Pre-procedure Verification/Time 12:52 [N/A:N/A] Out Taken: Tissue Debrided: [6:Subcutaneous, Slough] [N/A:N/A] Level: [6:Skin/Subcutaneous Tissue] [N/A:N/A] Debridement Area (sq cm): [6:4] [N/A:N/A] Instrument: [6:Curette] [N/A:N/A] Bleeding: [6:Minimum] [N/A:N/A] Hemostasis Achieved: [6:Pressure] [N/A:N/A] Procedural Pain: [6:0] [N/A:N/A] Post Procedural Pain: [6:0] [N/A:N/A] Debridement Treatment [6:Procedure was tolerated well] [N/A:N/A] Response: Post Debridement [6:3x4x0.1] [N/A:N/A] Measurements L x W x D (cm) Post Debridement Volume: [6:0.942] [N/A:N/A] (cm) Procedures Performed: [6:Debridement] [N/A:N/A] Treatment Notes DAYMIAN, LILL ([id number]) Wound #6 (Foot) Wound Laterality: Right, Distal, Circumferential Cleanser Soap and Water Discharge Instruction: Gently cleanse wound with antibacterial soap, rinse and pat dry prior to dressing wounds Peri-Wound Care Topical Primary Dressing Gauze Discharge Instruction: As directed: dry, moistened with saline  or moistened with Dakins Solution Santyl Collagenase Ointment, 30 (gm), tube Secondary Dressing Secured With Compression Wrap Compression Stockings Add-Ons Electronic Signature(s) Signed: 10/01/2021 1:04:29 PM By: Kalman Shan DO Entered By: Kalman Shan on 10/01/2021 13:03:50 [name] ([id number]) -------------------------------------------------------------------------------- [place] Details Patient Name: ANDRIY, SHERK. Date of Service: 10/01/2021 12:30 PM Medical Record Number: [id number] Patient Account Number: [id number] Date of Birth/Sex: September 10, 1961 (60 y.o. M) Treating RN: Carlene Coria Primary Care Jedrick Hutcherson: Karl Ito Other Clinician: Referring  Araf Clugston: Karl Ito Treating Joneric Streight/Extender: Yaakov Guthrie in Treatment: 8 Active Inactive Wound/Skin Impairment Nursing Diagnoses: Knowledge deficit related to ulceration/compromised skin integrity Goals: Patient/caregiver will verbalize understanding of skin care regimen Date Initiated: 08/06/2021 Date Inactivated: 08/13/2021 Target Resolution Date: 09/06/2021 Goal Status: Met Ulcer/skin breakdown will have a volume reduction of 30% by week 4 Date Initiated: 08/06/2021 Target Resolution Date: 10/06/2021 Goal Status: Active Ulcer/skin breakdown will have a volume reduction of 50% by week 8 Date Initiated: 08/06/2021 Target Resolution Date: 11/06/2021 Goal Status: Active Ulcer/skin breakdown will have a volume reduction of 80% by week 12 Date Initiated: 08/06/2021 Target Resolution Date: 12/07/2021 Goal Status: Active Ulcer/skin breakdown will heal within 14 weeks Date Initiated: 08/06/2021 Target Resolution Date: 01/04/2022 Goal Status: Active Interventions: Assess patient/caregiver ability to obtain necessary supplies Assess patient/caregiver ability to perform ulcer/skin care regimen upon admission and as needed Assess ulceration(s) every visit Notes: Electronic Signature(s) Signed: 10/01/2021 4:05:54 PM By: Carlene Coria RN Entered By: Carlene Coria on 10/01/2021 12:52:40 [name] ([id number]) -------------------------------------------------------------------------------- Pain Assessment Details Patient Name: KASPAR, ALBORNOZ. Date of Service: 10/01/2021 12:30 PM Medical Record Number: [id number] Patient Account Number: [id number] Date of Birth/Sex: 1961/05/11 (60 y.o. M) Treating RN: Carlene Coria Primary Care Ricard Faulkner: Karl Ito Other Clinician: Referring Kjirsten Bloodgood: Karl Ito Treating Tashena Ibach/Extender: Yaakov Guthrie in Treatment: 8 Active Problems Location of Pain Severity and Description of Pain Patient Has Paino No Site  Locations Pain Management and Medication Current Pain Management: Electronic Signature(s) Signed: 10/01/2021 4:05:54 PM By: Carlene Coria RN Entered By: Carlene Coria on 10/01/2021 12:42:54 [name] ([id number]) -------------------------------------------------------------------------------- Patient/Caregiver Education Details Patient Name: KNUTE, MAZZUCA. Date of Service: 10/01/2021 12:30 PM Medical Record Number: [id number] Patient Account Number: [id number] Date of Birth/Gender: Sep 06, 1961 (60 y.o. M) Treating RN: Carlene Coria Primary Care Physician: Karl Ito Other Clinician: Referring Physician: Karl Ito Treating Physician/Extender: Yaakov Guthrie in Treatment: 8 Education Assessment Education Provided To: Patient Education Topics Provided Wound/Skin Impairment: Methods: Explain/Verbal Responses: State content correctly Electronic Signature(s) Signed: 10/01/2021 4:05:54 PM By: Carlene Coria RN Entered By: Carlene Coria on 10/01/2021 12:56:37 YADIR, ZENTNER ([id number]) -------------------------------------------------------------------------------- Wound Assessment Details Patient Name: AMADO, ANDAL. Date of Service: 10/01/2021 12:30 PM Medical Record Number: [id number] Patient Account Number: [id number] Date of Birth/Sex: Nov 29, 1960 (60 y.o. M) Treating RN: Carlene Coria Primary Care Shaylyn Bawa: Karl Ito Other Clinician: Referring Javanni Maring: Karl Ito Treating Hazelene Doten/Extender: Yaakov Guthrie in Treatment: 8 Wound Status Wound Number: 6 Primary 2nd degree Burn Etiology: Wound Location: Right, Distal, Circumferential Foot Wound Status: Open Wounding Event: Thermal Burn Comorbid Coronary Artery Disease, Hypertension, Myocardial Date Acquired: 09/14/2021 History: Infarction, Type II Diabetes Weeks Of Treatment: 2 Clustered Wound: Yes Photos Wound Measurements Length: (cm) 3 Width: (cm) 4 Depth: (cm) 0.1 Area: (cm)  9.425 Volume: (cm) 0.942 % Reduction in Area: 65.7% % Reduction in Volume: 65.7% Epithelialization: None Tunneling: No Undermining: No Wound Description Classification: Full Thickness Without Exposed Support Structures Exudate Amount: Medium Exudate Type: Serosanguineous Exudate Color: red,  brown Foul Odor After Cleansing: No Slough/Fibrino Yes Wound Bed Granulation Amount: Medium (34-66%) Exposed Structure Granulation Quality: Red Fascia Exposed: No Necrotic Amount: Medium (34-66%) Fat Layer (Subcutaneous Tissue) Exposed: Yes Necrotic Quality: Adherent Slough Tendon Exposed: No Muscle Exposed: No Joint Exposed: No Bone Exposed: No Treatment Notes Wound #6 (Foot) Wound Laterality: Right, Distal, Circumferential Cleanser Soap and Water Discharge Instruction: Gently cleanse wound with antibacterial soap, rinse and pat dry prior to dressing wounds Peri-Wound Care SOPHEAP, BOEHLE ([id number]) Topical Primary Dressing Gauze Discharge Instruction: As directed: dry, moistened with saline or moistened with Dakins Solution Santyl Collagenase Ointment, 30 (gm), tube Secondary Dressing Secured With Compression Wrap Compression Stockings Add-Ons Electronic Signature(s) Signed: 10/01/2021 1:10:43 PM By: Carlene Coria RN Entered By: Carlene Coria on 10/01/2021 13:10:43 [name] ([id number]) -------------------------------------------------------------------------------- Vitals Details Patient Name: [name] Date of Service: 10/01/2021 12:30 PM Medical Record Number: [id number] Patient Account Number: [id number] Date of Birth/Sex: 1961/09/25 (60 y.o. M) Treating RN: Carlene Coria Primary Care Dub Maclellan: Karl Ito Other Clinician: Referring Lashanti Chambless: Karl Ito Treating Trysten Berti/Extender: Yaakov Guthrie in Treatment: 8 Vital Signs Time Taken: 12:41 Temperature (F): 97.8 Height (in): 65 Pulse (bpm): 84 Weight (lbs): 180 Respiratory Rate  (breaths/min): 18 Body Mass Index (BMI): 30 Blood Pressure (mmHg): 169/90 Reference Range: 80 - 120 mg / dl Electronic Signature(s) Signed: 10/01/2021 4:05:54 PM By: Carlene Coria RN Entered By: Carlene Coria on 10/01/2021 12:42:34

## 2021-10-08 ENCOUNTER — Encounter (HOSPITAL_BASED_OUTPATIENT_CLINIC_OR_DEPARTMENT_OTHER): Payer: Medicare Other | Admitting: Internal Medicine

## 2021-10-08 ENCOUNTER — Other Ambulatory Visit: Payer: Self-pay

## 2021-10-08 DIAGNOSIS — E11622 Type 2 diabetes mellitus with other skin ulcer: Secondary | ICD-10-CM

## 2021-10-08 DIAGNOSIS — L97522 Non-pressure chronic ulcer of other part of left foot with fat layer exposed: Secondary | ICD-10-CM | POA: Diagnosis not present

## 2021-10-08 DIAGNOSIS — T25222A Burn of second degree of left foot, initial encounter: Secondary | ICD-10-CM | POA: Diagnosis not present
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
## 2021-10-08 NOTE — Progress Notes (Signed)
Mundie, Jayceon F. ([id number]) °Visit Report for 10/08/2021 °Arrival Information Details °Patient Name: [name], [name] °Date of Service: 10/08/2021 12:30 PM °Medical Record Number: [id number] °Patient Account Number: [id number] °Date of Birth/Sex: 01/17/1961 (60 y.o. M) °Treating RN: Epps, Carrie °Primary Care Provider: Cable, Terri Other Clinician: °Referring Provider: Cable, Susumu °Treating Provider/Extender: Hoffman, Jessica °Weeks in Treatment: 9 °Visit Information History Since Last Visit °All ordered tests and consults were completed: No °Patient Arrived: Ambulatory °Added or deleted any medications: No °Arrival Time: 12:36 °Any new allergies or adverse reactions: No °Accompanied By: daughter °Had a fall or experienced change in No °Transfer Assistance: None °activities of daily living that may affect °Patient Identification Verified: Yes °risk of falls: °Secondary Verification Process Completed: Yes °Signs or symptoms of abuse/neglect since last visito No °Patient Requires Transmission-Based Precautions: No °Hospitalized since last visit: No °Patient Has Alerts: No °Implantable device outside of the clinic excluding No °cellular tissue based products placed in the center °since last visit: °Has Dressing in Place as Prescribed: Yes °Pain Present Now: No °Electronic Signature(s) °Signed: 10/08/2021 1:39:07 PM By: Epps, Carrie RN °Entered By: Epps, Carrie on 10/08/2021 12:40:20 °Amores, Luigi F. ([id number]) °-------------------------------------------------------------------------------- °Clinic Level of Care Assessment Details °Patient Name: [name], [name] °Date of Service: 10/08/2021 12:30 PM °Medical Record Number: [id number] °Patient Account Number: [id number] °Date of Birth/Sex: 08/14/1961 (60 y.o. M) °Treating RN: Epps, Carrie °Primary Care Provider: Cable, Hughey Other Clinician: °Referring Provider: Cable, Maxmillian °Treating Provider/Extender: Hoffman, Jessica °Weeks in Treatment: 9 °Clinic Level of Care  Assessment Items °TOOL 1 Quantity Score °[] - Use when EandM and Procedure is performed on INITIAL visit 0 °ASSESSMENTS - Nursing Assessment / Reassessment °[] - General Physical Exam (combine w/ comprehensive assessment (listed just below) when performed on new °0 °pt. evals) °[] - 0 °Comprehensive Assessment (HX, ROS, Risk Assessments, Wounds Hx, etc.) °ASSESSMENTS - Wound and Skin Assessment / Reassessment °[] - Dermatologic / Skin Assessment (not related to wound area) 0 °ASSESSMENTS - Ostomy and/or Continence Assessment and Care °[] - Incontinence Assessment and Management 0 °[] - 0 °Ostomy Care Assessment and Management (repouching, etc.) °PROCESS - Coordination of Care °[] - Simple Patient / Family Education for ongoing care 0 °[] - 0 °Complex (extensive) Patient / Family Education for ongoing care °[] - 0 °Staff obtains Consents, Records, Test Results / Process Orders °[] - 0 °Staff telephones HHA, Nursing Homes / Clarify orders / etc °[] - 0 °Routine Transfer to another Facility (non-emergent condition) °[] - 0 °Routine Hospital Admission (non-emergent condition) °[] - 0 °New Admissions / Insurance Authorizations / Ordering NPWT, Apligraf, etc. °[] - 0 °Emergency Hospital Admission (emergent condition) °PROCESS - Special Needs °[] - Pediatric / Minor Patient Management 0 °[] - 0 °Isolation Patient Management °[] - 0 °Hearing / Language / Visual special needs °[] - 0 °Assessment of Community assistance (transportation, D/C planning, etc.) °[] - 0 °Additional assistance / Altered mentation °[] - 0 °Support Surface(s) Assessment (bed, cushion, seat, etc.) °INTERVENTIONS - Miscellaneous °[] - External ear exam 0 °[] - 0 °Patient Transfer (multiple staff / Hoyer Lift / Similar devices) °[] - 0 °Simple Staple / Suture removal (25 or less) °[] - 0 °Complex Staple / Suture removal (26 or more) °[] - 0 °Hypo/Hyperglycemic Management (do not check if billed separately) °[] - 0 °Ankle / Brachial Index (ABI) - do not  check if billed separately °Has the patient been seen at the hospital within the last three years: Yes °Total Score: 0 °Level   Of Care: ____ °[name], [name] ([id number]) °Electronic Signature(s) °Signed: 10/08/2021 1:39:07 PM By: Epps, Carrie RN °Entered By: Epps, Carrie on 10/08/2021 12:53:44 °[name], [name] ([id number]) °-------------------------------------------------------------------------------- °Encounter Discharge Information Details °Patient Name: [name], [name] °Date of Service: 10/08/2021 12:30 PM °Medical Record Number: [id number] °Patient Account Number: [id number] °Date of Birth/Sex: 04/16/1961 (60 y.o. M) °Treating RN: Epps, Carrie °Primary Care Provider: Cable, Taft Other Clinician: °Referring Provider: Cable, Demarus °Treating Provider/Extender: Hoffman, Jessica °Weeks in Treatment: 9 °Encounter Discharge Information Items Post Procedure Vitals °Discharge Condition: Stable °Temperature (°F): 98.2 °Ambulatory Status: Ambulatory °Pulse (bpm): 83 °Discharge Destination: Home °Respiratory Rate (breaths/min): 18 °Transportation: Private Auto °Blood Pressure (mmHg): 133/77 °Accompanied By: self °Schedule Follow-up Appointment: Yes °Clinical Summary of Care: Patient Declined °Electronic Signature(s) °Signed: 10/08/2021 1:39:07 PM By: Epps, Carrie RN °Entered By: Epps, Carrie on 10/08/2021 12:55:37 °[name], [name] ([id number]) °-------------------------------------------------------------------------------- °Lower Extremity Assessment Details °Patient Name: [name], [name] °Date of Service: 10/08/2021 12:30 PM °Medical Record Number: [id number] °Patient Account Number: [id number] °Date of Birth/Sex: 09/05/1961 (60 y.o. M) °Treating RN: Epps, Carrie °Primary Care Provider: Cable, Maveryk Other Clinician: °Referring Provider: Cable, Sherrel °Treating Provider/Extender: Hoffman, Jessica °Weeks in Treatment: 9 °Edema Assessment °Assessed: [Left: No] [Right: No] °Edema: [Left: Ye] [Right: s] °Calf °Left:  Right: °Point of Measurement: 30 cm From Medial Instep 31 cm °Ankle °Left: Right: °Point of Measurement: 10 cm From Medial Instep 20 cm °Vascular Assessment °Pulses: °Dorsalis Pedis °Palpable: [Left:Yes] °Electronic Signature(s) °Signed: 10/08/2021 1:39:07 PM By: Epps, Carrie RN °Entered By: Epps, Carrie on 10/08/2021 12:44:26 °Rhyner, Atharv F. ([id number]) °-------------------------------------------------------------------------------- °Multi Wound Chart Details °Patient Name: [name], [name] °Date of Service: 10/08/2021 12:30 PM °Medical Record Number: [id number] °Patient Account Number: [id number] °Date of Birth/Sex: 07/08/1961 (60 y.o. M) °Treating RN: Epps, Carrie °Primary Care Provider: Cable, Nitesh Other Clinician: °Referring Provider: Cable, Vaibhav °Treating Provider/Extender: Hoffman, Jessica °Weeks in Treatment: 9 °Vital Signs °Height(in): 65 °Pulse(bpm): 83 °Weight(lbs): 180 °Blood Pressure(mmHg): 133/77 °Body Mass Index(BMI): 30 °Temperature(Â°F): 98.2 °Respiratory Rate(breaths/min): 18 °Photos: [N/A:N/A] °Wound Location: Right, Distal, Circumferential Foot N/A N/A °Wounding Event: Thermal Burn N/A N/A °Primary Etiology: 2nd degree Burn N/A N/A °Comorbid History: Coronary Artery Disease, N/A N/A °Hypertension, Myocardial Infarction, °Type II Diabetes °Date Acquired: 09/14/2021 N/A N/A °Weeks of Treatment: 3 N/A N/A °Wound Status: Open N/A N/A °Clustered Wound: Yes N/A N/A °Measurements L x W x D (cm) 2x4x0.1 N/A N/A °Area (cm²) : 6.283 N/A N/A °Volume (cm³) : 0.628 N/A N/A °% Reduction in Area: 77.10% N/A N/A °% Reduction in Volume: 77.20% N/A N/A °Classification: Full Thickness Without Exposed N/A N/A °Support Structures °Exudate Amount: Medium N/A N/A °Exudate Type: Serosanguineous N/A N/A °Exudate Color: red, brown N/A N/A °Granulation Amount: Medium (34-66%) N/A N/A °Granulation Quality: Red N/A N/A °Necrotic Amount: Medium (34-66%) N/A N/A °Exposed Structures: °Fat Layer (Subcutaneous Tissue): N/A  N/A °Yes °Fascia: No °Tendon: No °Muscle: No °Joint: No °Bone: No °Epithelialization: None N/A N/A °Treatment Notes °Electronic Signature(s) °Signed: 10/08/2021 1:39:07 PM By: Epps, Carrie RN °Entered By: Epps, Carrie on 10/08/2021 12:50:20 °[name], [name] ([id number]) °-------------------------------------------------------------------------------- °Multi-Disciplinary Care Plan Details °Patient Name: [name], [name] °Date of Service: 10/08/2021 12:30 PM °Medical Record Number: [id number] °Patient Account Number: [id number] °Date of Birth/Sex: 10/03/1961 (60 y.o. M) °Treating RN: Epps, Carrie °Primary Care Provider: Cable, Tagen Other Clinician: °Referring Provider: Cable, [place] °Treating Provider/Extender: Hoffman, Jessica °Weeks in Treatment: 9 °Active Inactive °Wound/Skin Impairment °Nursing Diagnoses: °Knowledge deficit related to ulceration/compromised skin integrity °Goals: °Patient/caregiver will verbalize understanding of skin   care regimen °Date Initiated: 08/06/2021 °Date Inactivated: 08/13/2021 °Target Resolution Date: 09/06/2021 °Goal Status: Met °Ulcer/skin breakdown will have a volume reduction of 30% by week 4 °Date Initiated: 08/06/2021 °Target Resolution Date: 10/06/2021 °Goal Status: Active °Ulcer/skin breakdown will have a volume reduction of 50% by week 8 °Date Initiated: 08/06/2021 °Target Resolution Date: 11/06/2021 °Goal Status: Active °Ulcer/skin breakdown will have a volume reduction of 80% by week 12 °Date Initiated: 08/06/2021 °Target Resolution Date: 12/07/2021 °Goal Status: Active °Ulcer/skin breakdown will heal within 14 weeks °Date Initiated: 08/06/2021 °Target Resolution Date: 01/04/2022 °Goal Status: Active °Interventions: °Assess patient/caregiver ability to obtain necessary supplies °Assess patient/caregiver ability to perform ulcer/skin care regimen upon admission and as needed °Assess ulceration(s) every visit °Notes: °Electronic Signature(s) °Signed: 10/08/2021 1:39:07 PM By: Epps,  Carrie RN °Entered By: Epps, Carrie on 10/08/2021 12:50:07 °[name], [name] ([id number]) °-------------------------------------------------------------------------------- °Pain Assessment Details °Patient Name: [name], [name] °Date of Service: 10/08/2021 12:30 PM °Medical Record Number: [id number] °Patient Account Number: [id number] °Date of Birth/Sex: 09/10/1961 (60 y.o. M) °Treating RN: Epps, Carrie °Primary Care Sapna Padron: Cable, Xavior Other Clinician: °Referring Jeslynn Hollander: Cable, Darron °Treating Pantera Winterrowd/Extender: Hoffman, Jessica °Weeks in Treatment: 9 °Active Problems °Location of Pain Severity and Description of Pain °Patient Has Paino No °Site Locations °Pain Management and Medication °Current Pain Management: °Electronic Signature(s) °Signed: 10/08/2021 1:39:07 PM By: Epps, Carrie RN °Entered By: Epps, Carrie on 10/08/2021 12:40:52 °[name], [name] ([id number]) °-------------------------------------------------------------------------------- °Patient/Caregiver Education Details °Patient Name: [name], Alexei F. °Date of Service: 10/08/2021 12:30 PM °Medical Record Number: [id number] °Patient Account Number: [id number] °Date of Birth/Gender: 01/13/1961 (60 y.o. M) °Treating RN: Epps, Carrie °Primary Care Physician: Cable, Dalen Other Clinician: °Referring Physician: Cable, Adyn °Treating Physician/Extender: Hoffman, Jessica °Weeks in Treatment: 9 °Education Assessment °Education Provided To: °Patient °Education Topics Provided °Wound/Skin Impairment: °Methods: Explain/Verbal °Responses: State content correctly °Electronic Signature(s) °Signed: 10/08/2021 1:39:07 PM By: Epps, Carrie RN °Entered By: Epps, Carrie on 10/08/2021 12:54:05 °Weinkauf, Amritpal F. ([id number]) °-------------------------------------------------------------------------------- °Wound Assessment Details °Patient Name: Sponaugle, Anterrio F. °Date of Service: 10/08/2021 12:30 PM °Medical Record Number: [id number] °Patient Account Number: [id number] °Date of  Birth/Sex: 01/22/1961 (60 y.o. M) °Treating RN: Epps, Carrie °Primary Care Madgeline Rayo: Cable, Zach Other Clinician: °Referring Briyana Badman: Cable, Elizardo °Treating Aseem Sessums/Extender: Hoffman, Jessica °Weeks in Treatment: 9 °Wound Status °Wound Number: 6 Primary 2nd degree Burn °Etiology: °Wound Location: Right, Distal, Circumferential Foot °Wound Status: Open °Wounding Event: Thermal Burn °Comorbid Coronary Artery Disease, Hypertension, Myocardial °Date Acquired: 09/14/2021 °History: Infarction, Type II Diabetes °Weeks Of Treatment: 3 °Clustered Wound: Yes °Photos °Wound Measurements °Length: (cm) 2 °Width: (cm) 4 °Depth: (cm) 0.1 °Area: (cm²) 6.283 °Volume: (cm³) 0.628 °% Reduction in Area: 77.1% °% Reduction in Volume: 77.2% °Epithelialization: None °Tunneling: No °Undermining: No °Wound Description °Classification: Full Thickness Without Exposed Support Structures °Exudate Amount: Medium °Exudate Type: Serosanguineous °Exudate Color: red, brown °Foul Odor After Cleansing: No °Slough/Fibrino Yes °Wound Bed °Granulation Amount: Medium (34-66%) Exposed Structure °Granulation Quality: Red °Fascia Exposed: No °Necrotic Amount: Medium (34-66%) °Fat Layer (Subcutaneous Tissue) Exposed: Yes °Necrotic Quality: Adherent Slough °Tendon Exposed: No °Muscle Exposed: No °Joint Exposed: No °Bone Exposed: No °Treatment Notes °Wound #6 (Foot) Wound Laterality: Right, Distal, Circumferential °Cleanser °Soap and Water °Discharge Instruction: Gently cleanse wound with antibacterial soap, rinse and pat dry prior to dressing wounds °Peri-Wound Care °Reineck, Hendrick F. ([id number]) °Topical °Primary Dressing °Gauze °Discharge Instruction: As directed: dry, moistened with saline or moistened with Dakins Solution °Santyl Collagenase Ointment, 30 (gm), tube °Secondary Dressing °Secured With °Compression Wrap °Compression Stockings °Add-Ons °  Electronic Signature(s) °Signed: 10/08/2021 1:39:07 PM By: Epps, Carrie RN °Entered By: Epps, Carrie on  10/08/2021 12:43:23 °Panuco, Lendon F. ([id number]) °-------------------------------------------------------------------------------- °Vitals Details °Patient Name: Gannett, Oniel F. °Date of Service: 10/08/2021 12:30 PM °Medical Record Number: [id number] °Patient Account Number: [id number] °Date of Birth/Sex: 10/28/1960 (60 y.o. M) °Treating RN: Epps, Carrie °Primary Care Provider: Cable, Khaliq Other Clinician: °Referring Provider: Cable, Luismario °Treating Provider/Extender: Hoffman, Jessica °Weeks in Treatment: 9 °Vital Signs °Time Taken: 12:40 °Temperature (Â°F): 98.2 °Height (in): 65 °Pulse (bpm): 83 °Weight (lbs): 180 °Respiratory Rate (breaths/min): 18 °Body Mass Index (BMI): 30 °Blood Pressure (mmHg): 133/77 °Reference Range: 80 - 120 mg / dl °Electronic Signature(s) °Signed: 10/08/2021 1:39:07 PM By: Epps, Carrie RN °Entered By: Epps, Carrie on 10/08/2021 12:40:41 °

## 2021-10-15 ENCOUNTER — Encounter (HOSPITAL_BASED_OUTPATIENT_CLINIC_OR_DEPARTMENT_OTHER): Payer: Medicare Other | Admitting: Internal Medicine

## 2021-10-15 ENCOUNTER — Other Ambulatory Visit: Payer: Self-pay

## 2021-10-15 DIAGNOSIS — L97522 Non-pressure chronic ulcer of other part of left foot with fat layer exposed: Secondary | ICD-10-CM | POA: Diagnosis not present

## 2021-10-15 DIAGNOSIS — E11622 Type 2 diabetes mellitus with other skin ulcer: Secondary | ICD-10-CM | POA: Diagnosis not present

## 2021-10-15 NOTE — Progress Notes (Signed)
JAYDON, AVINA ([id number]) Visit Report for 10/15/2021 Arrival Information Details Patient Name: [name], [name]. Date of Service: 10/15/2021 12:30 PM Medical Record Number: [id number] Patient Account Number: [id number] Date of Birth/Sex: 05/09/1961 (60 y.o. M) Treating RN: Levora Dredge Primary Care Devonn Giampietro: Karl Ito Other Clinician: Referring Nicky Milhouse: Karl Ito Treating Tanairi Cypert/Extender: Yaakov Guthrie in Treatment: 10 Visit Information History Since Last Visit Added or deleted any medications: No Patient Arrived: Ambulatory Any new allergies or adverse reactions: No Arrival Time: 12:41 Had a fall or experienced change in No Accompanied By: daughter activities of daily living that may affect Transfer Assistance: None risk of falls: Patient Identification Verified: Yes Hospitalized since last visit: No Secondary Verification Process Completed: Yes Has Dressing in Place as Prescribed: Yes Patient Requires Transmission-Based Precautions: No Pain Present Now: No Patient Has Alerts: No Electronic Signature(s) Signed: 10/15/2021 4:21:58 PM By: Levora Dredge Entered By: Levora Dredge on 10/15/2021 12:42:08 [name] ([id number]) -------------------------------------------------------------------------------- Clinic Level of Care Assessment Details Patient Name: [name], [name]. Date of Service: 10/15/2021 12:30 PM Medical Record Number: [id number] Patient Account Number: [id number] Date of Birth/Sex: 1961/01/24 (60 y.o. M) Treating RN: Levora Dredge Primary Care Evora Schechter: Karl Ito Other Clinician: Referring Rulon Abdalla: Karl Ito Treating Fermon Ureta/Extender: Yaakov Guthrie in Treatment: 10 Clinic Level of Care Assessment Items TOOL 1 Quantity Score '[]'  - Use when EandM and Procedure is performed on INITIAL visit 0 ASSESSMENTS - Nursing Assessment / Reassessment '[]'  - General Physical Exam (combine w/ comprehensive assessment (listed  just below) when performed on new 0 pt. evals) '[]'  - 0 Comprehensive Assessment (HX, ROS, Risk Assessments, Wounds Hx, etc.) ASSESSMENTS - Wound and Skin Assessment / Reassessment '[]'  - Dermatologic / Skin Assessment (not related to wound area) 0 ASSESSMENTS - Ostomy and/or Continence Assessment and Care '[]'  - Incontinence Assessment and Management 0 '[]'  - 0 Ostomy Care Assessment and Management (repouching, etc.) PROCESS - Coordination of Care '[]'  - Simple Patient / Family Education for ongoing care 0 '[]'  - 0 Complex (extensive) Patient / Family Education for ongoing care '[]'  - 0 Staff obtains Programmer, systems, Records, Test Results / Process Orders '[]'  - 0 Staff telephones HHA, Nursing Homes / Clarify orders / etc '[]'  - 0 Routine Transfer to another Facility (non-emergent condition) '[]'  - 0 Routine Hospital Admission (non-emergent condition) '[]'  - 0 New Admissions / Biomedical engineer / Ordering NPWT, Apligraf, etc. '[]'  - 0 Emergency Hospital Admission (emergent condition) PROCESS - Special Needs '[]'  - Pediatric / Minor Patient Management 0 '[]'  - 0 Isolation Patient Management '[]'  - 0 Hearing / Language / Visual special needs '[]'  - 0 Assessment of Community assistance (transportation, D/C planning, etc.) '[]'  - 0 Additional assistance / Altered mentation '[]'  - 0 Support Surface(s) Assessment (bed, cushion, seat, etc.) INTERVENTIONS - Miscellaneous '[]'  - External ear exam 0 '[]'  - 0 Patient Transfer (multiple staff / Civil Service fast streamer / Similar devices) '[]'  - 0 Simple Staple / Suture removal (25 or less) '[]'  - 0 Complex Staple / Suture removal (26 or more) '[]'  - 0 Hypo/Hyperglycemic Management (do not check if billed separately) '[]'  - 0 Ankle / Brachial Index (ABI) - do not check if billed separately Has the patient been seen at the hospital within the last three years: Yes Total Score: 0 Level Of Care: ____ [name] ([id number]) Electronic Signature(s) Signed: 10/15/2021 4:21:58 PM By:  Levora Dredge Entered By: Levora Dredge on 10/15/2021 13:26:57 [name] ([id number]) -------------------------------------------------------------------------------- Encounter Discharge Information Details Patient Name: [name], [name].  Date of Service: 10/15/2021 12:30 PM Medical Record Number: [id number] Patient Account Number: [id number] Date of Birth/Sex: 05/18/1961 (60 y.o. M) Treating RN: Levora Dredge Primary Care Jaaziel Peatross: Karl Ito Other Clinician: Referring Donivin Wirt: Karl Ito Treating Dalayna Lauter/Extender: Yaakov Guthrie in Treatment: 10 Encounter Discharge Information Items Post Procedure Vitals Discharge Condition: Stable Temperature (F): 97.6 Ambulatory Status: Ambulatory Pulse (bpm): 81 Discharge Destination: Home Respiratory Rate (breaths/min): 18 Transportation: Private Auto Blood Pressure (mmHg): 108/71 Accompanied By: daughter Schedule Follow-up Appointment: Yes Clinical Summary of Care: Electronic Signature(s) Signed: 10/15/2021 4:21:58 PM By: Levora Dredge Entered By: Levora Dredge on 10/15/2021 13:28:45 [name] ([id number]) -------------------------------------------------------------------------------- Lower Extremity Assessment Details Patient Name: [name], [name]. Date of Service: 10/15/2021 12:30 PM Medical Record Number: [id number] Patient Account Number: [id number] Date of Birth/Sex: [birth year] (60 y.o. M) Treating RN: Levora Dredge Primary Care Yazmyn Valbuena: Karl Ito Other Clinician: Referring Geraldine Sandberg: Karl Ito Treating Keaisha Sublette/Extender: Yaakov Guthrie in Treatment: 10 Edema Assessment Assessed: [Left: No] Patrice Paradise: No] [Left: Edema] [Right: :] Calf Left: Right: Point of Measurement: 30 cm From Medial Instep 34.5 cm Ankle Left: Right: Point of Measurement: 10 cm From Medial Instep 21.2 cm Vascular Assessment Pulses: Dorsalis Pedis Palpable: [Left:Yes] Electronic Signature(s) Signed:  10/15/2021 4:21:58 PM By: Levora Dredge Entered By: Levora Dredge on 10/15/2021 12:55:47 [name] ([id number]) -------------------------------------------------------------------------------- Multi Wound Chart Details Patient Name: [name], [name]. Date of Service: 10/15/2021 12:30 PM Medical Record Number: [id number] Patient Account Number: [id number] Date of Birth/Sex: 1961/02/27 (60 y.o. M) Treating RN: Levora Dredge Primary Care Kamryn Messineo: Karl Ito Other Clinician: Referring Abia [name]: Karl Ito Treating Mitchael Luckey/Extender: Yaakov Guthrie in Treatment: 10 Vital Signs Height(in): 65 Pulse(bpm): 53 Weight(lbs): 180 Blood Pressure(mmHg): 108/71 Body Mass Index(BMI): 30 Temperature(F): 97.6 Respiratory Rate(breaths/min): 18 Photos: [N/A:N/A] Wound Location: Left, Distal, Circumferential Foot N/A N/A Wounding Event: Thermal Burn N/A N/A Primary Etiology: 2nd degree Burn N/A N/A Comorbid History: Coronary Artery Disease, N/A N/A Hypertension, Myocardial Infarction, Type II Diabetes Date Acquired: 09/14/2021 N/A N/A Weeks of Treatment: 4 N/A N/A Wound Status: Open N/A N/A Clustered Wound: Yes N/A N/A Measurements L x W x D (cm) 1.6x0.4x0.1 N/A N/A Area (cm) : 0.503 N/A N/A Volume (cm) : 0.05 N/A N/A % Reduction in Area: 98.20% N/A N/A % Reduction in Volume: 98.20% N/A N/A Classification: Full Thickness Without Exposed N/A N/A Support Structures Exudate Amount: Medium N/A N/A Exudate Type: Serosanguineous N/A N/A Exudate Color: red, brown N/A N/A Granulation Amount: Small (1-33%) N/A N/A Granulation Quality: Red, Pink N/A N/A Necrotic Amount: Large (67-100%) N/A N/A Exposed Structures: Fat Layer (Subcutaneous Tissue): N/A N/A Yes Fascia: No Tendon: No Muscle: No Joint: No Bone: No Epithelialization: None N/A N/A Treatment Notes Electronic Signature(s) Signed: 10/15/2021 4:21:58 PM By: Levora Dredge Entered By: Levora Dredge on  10/15/2021 13:06:14 [name] ([id number]) -------------------------------------------------------------------------------- Multi-Disciplinary Care Plan Details Patient Name: [name], [name]. Date of Service: 10/15/2021 12:30 PM Medical Record Number: [id number] Patient Account Number: [id number] Date of Birth/Sex: [birth year] (60 y.o. M) Treating RN: Levora Dredge Primary Care Majd Tissue: Karl Ito Other Clinician: Referring Angelisse Riso: Karl Ito Treating Saif Peter/Extender: Yaakov Guthrie in Treatment: 10 Active Inactive Wound/Skin Impairment Nursing Diagnoses: Knowledge deficit related to ulceration/compromised skin integrity Goals: Patient/caregiver will verbalize understanding of skin care regimen Date Initiated: 08/06/2021 Date Inactivated: 08/13/2021 Target Resolution Date: 09/06/2021 Goal Status: Met Ulcer/skin breakdown will have a volume reduction of 30% by week 4 Date Initiated: 08/06/2021 Target Resolution Date: 10/06/2021 Goal Status: Active Ulcer/skin breakdown will have a volume  reduction of 50% by week 8 Date Initiated: 08/06/2021 Target Resolution Date: 11/06/2021 Goal Status: Active Ulcer/skin breakdown will have a volume reduction of 80% by week 12 Date Initiated: 08/06/2021 Target Resolution Date: 12/07/2021 Goal Status: Active Ulcer/skin breakdown will heal within 14 weeks Date Initiated: 08/06/2021 Target Resolution Date: 01/04/2022 Goal Status: Active Interventions: Assess patient/caregiver ability to obtain necessary supplies Assess patient/caregiver ability to perform ulcer/skin care regimen upon admission and as needed Assess ulceration(s) every visit Notes: Electronic Signature(s) Signed: 10/15/2021 4:21:58 PM By: Levora Dredge Entered By: Levora Dredge on 10/15/2021 13:06:03 [name] ([id number]) -------------------------------------------------------------------------------- Pain Assessment Details Patient Name:  [name], [name]. Date of Service: 10/15/2021 12:30 PM Medical Record Number: [id number] Patient Account Number: [id number] Date of Birth/Sex: 22-Nov-1960 (59 y.o. M) Treating RN: Levora Dredge Primary Care Gricelda Foland: Karl Ito Other Clinician: Referring Drystan Reader: Karl Ito Treating Acsa Estey/Extender: Yaakov Guthrie in Treatment: 10 Active Problems Location of Pain Severity and Description of Pain Patient Has Paino No Site Locations Rate the pain. Current Pain Level: 0 Pain Management and Medication Current Pain Management: Electronic Signature(s) Signed: 10/15/2021 4:21:58 PM By: Levora Dredge Entered By: Levora Dredge on 10/15/2021 12:44:27 [name] ([id number]) -------------------------------------------------------------------------------- Patient/Caregiver Education Details Patient Name: [name], [name]. Date of Service: 10/15/2021 12:30 PM Medical Record Number: [id number] Patient Account Number: [id number] Date of Birth/Gender: 14-Jun-1961 (60 y.o. M) Treating RN: Levora Dredge Primary Care Physician: Karl Ito Other Clinician: Referring Physician: Karl Ito Treating Physician/Extender: Yaakov Guthrie in Treatment: 10 Education Assessment Education Provided To: Patient Education Topics Provided Wound/Skin Impairment: Handouts: Caring for Your Ulcer Methods: Explain/Verbal Responses: State content correctly Electronic Signature(s) Signed: 10/15/2021 4:21:58 PM By: Levora Dredge Entered By: Levora Dredge on 10/15/2021 13:27:22 SHALIK, SANFILIPPO ([id number]) -------------------------------------------------------------------------------- Wound Assessment Details Patient Name: [name], [name]. Date of Service: 10/15/2021 12:30 PM Medical Record Number: [id number] Patient Account Number: [id number] Date of Birth/Sex: [birth year] (60 y.o. M) Treating RN: Levora Dredge Primary Care Yu Cragun: Karl Ito Other  Clinician: Referring Keondrick Dilks: Karl Ito Treating Adoni Greenough/Extender: Yaakov Guthrie in Treatment: 10 Wound Status Wound Number: 6 Primary 2nd degree Burn Etiology: Wound Location: Left, Distal, Circumferential Foot Wound Status: Open Wounding Event: Thermal Burn Comorbid Coronary Artery Disease, Hypertension, Myocardial Date Acquired: 09/14/2021 History: Infarction, Type II Diabetes Weeks Of Treatment: 4 Clustered Wound: Yes Photos Wound Measurements Length: (cm) 1.6 Width: (cm) 3 Depth: (cm) 0.1 Area: (cm) 3.77 Volume: (cm) 0.377 % Reduction in Area: 86.3% % Reduction in Volume: 86.3% Epithelialization: None Tunneling: No Undermining: No Wound Description Classification: Full Thickness Without Exposed Support Structures Exudate Amount: Medium Exudate Type: Serosanguineous Exudate Color: red, brown Foul Odor After Cleansing: No Slough/Fibrino Yes Wound Bed Granulation Amount: Small (1-33%) Exposed Structure Granulation Quality: Red, Pink Fascia Exposed: No Necrotic Amount: Large (67-100%) Fat Layer (Subcutaneous Tissue) Exposed: Yes Tendon Exposed: No Muscle Exposed: No Joint Exposed: No Bone Exposed: No Treatment Notes Wound #6 (Foot) Wound Laterality: Left, Distal, Circumferential Cleanser Soap and Water Discharge Instruction: Gently cleanse wound with antibacterial soap, rinse and pat dry prior to dressing wounds [place]. ([id number]) Topical Primary Dressing Gauze Santyl Collagenase Ointment, 30 (gm), tube Secondary Dressing Secured With Compression Wrap Compression Stockings Add-Ons Electronic Signature(s) Signed: 10/15/2021 4:21:58 PM By: Levora Dredge Entered By: Levora Dredge on 10/15/2021 13:16:20 [name] ([id number]) -------------------------------------------------------------------------------- Vitals Details Patient Name: [name] Date of Service: 10/15/2021 12:30 PM Medical Record  Number: [id number] Patient Account Number: [id number] Date of Birth/Sex: 07-02-1961 (60 y.o. M)  Treating RN: Levora Dredge Primary Care Issam Carlyon: Karl Ito Other Clinician: Referring Cesilia Shinn: Karl Ito Treating Syrus Nakama/Extender: Yaakov Guthrie in Treatment: 10 Vital Signs Time Taken: 12:42 Temperature (F): 97.6 Height (in): 65 Pulse (bpm): 81 Weight (lbs): 180 Respiratory Rate (breaths/min): 18 Body Mass Index (BMI): 30 Blood Pressure (mmHg): 108/71 Reference Range: 80 - 120 mg / dl Electronic Signature(s) Signed: 10/15/2021 4:21:58 PM By: Levora Dredge Entered By: Levora Dredge on 10/15/2021 12:44:02
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
## 2021-10-22 ENCOUNTER — Other Ambulatory Visit: Payer: Self-pay

## 2021-10-22 ENCOUNTER — Encounter: Payer: Medicare Other | Attending: Internal Medicine | Admitting: Internal Medicine

## 2021-10-22 DIAGNOSIS — E11621 Type 2 diabetes mellitus with foot ulcer: Secondary | ICD-10-CM | POA: Insufficient documentation

## 2021-10-22 DIAGNOSIS — T25222A Burn of second degree of left foot, initial encounter: Secondary | ICD-10-CM | POA: Diagnosis not present

## 2021-10-22 DIAGNOSIS — I110 Hypertensive heart disease with heart failure: Secondary | ICD-10-CM | POA: Insufficient documentation

## 2021-10-22 DIAGNOSIS — F17210 Nicotine dependence, cigarettes, uncomplicated: Secondary | ICD-10-CM | POA: Insufficient documentation

## 2021-10-22 DIAGNOSIS — L97522 Non-pressure chronic ulcer of other part of left foot with fat layer exposed: Secondary | ICD-10-CM | POA: Diagnosis not present

## 2021-10-22 DIAGNOSIS — Z794 Long term (current) use of insulin: Secondary | ICD-10-CM | POA: Insufficient documentation

## 2021-10-22 DIAGNOSIS — X58XXXA Exposure to other specified factors, initial encounter: Secondary | ICD-10-CM | POA: Insufficient documentation

## 2021-10-22 DIAGNOSIS — Z89422 Acquired absence of other left toe(s): Secondary | ICD-10-CM | POA: Diagnosis not present

## 2021-10-22 DIAGNOSIS — Z951 Presence of aortocoronary bypass graft: Secondary | ICD-10-CM | POA: Diagnosis not present

## 2021-10-22 DIAGNOSIS — I5022 Chronic systolic (congestive) heart failure: Secondary | ICD-10-CM | POA: Diagnosis not present

## 2021-10-22 DIAGNOSIS — I25119 Atherosclerotic heart disease of native coronary artery with unspecified angina pectoris: Secondary | ICD-10-CM | POA: Insufficient documentation

## 2021-10-22 DIAGNOSIS — E11622 Type 2 diabetes mellitus with other skin ulcer: Secondary | ICD-10-CM
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
## 2021-10-22 NOTE — Progress Notes (Signed)
CONNERY, SHIFFLER ([id number]) Visit Report for 10/22/2021 Arrival Information Details Patient Name: [name], [name]. Date of Service: 10/22/2021 9:45 AM Medical Record Number: [id number] Patient Account Number: [id number] Date of Birth/Sex: 1961-03-13 (61 y.o. M) Treating RN: Levora Dredge Primary Care Chevy Sweigert: Karl Ito Other Clinician: Referring Jaymar Loeber: Karl Ito Treating Robben Jagiello/Extender: Yaakov Guthrie in Treatment: 11 Visit Information History Since Last Visit Added or deleted any medications: No Patient Arrived: Ambulatory Any new allergies or adverse reactions: No Arrival Time: 10:00 Had a fall or experienced change in No Accompanied By: daughter activities of daily living that may affect Transfer Assistance: None risk of falls: Patient Identification Verified: Yes Hospitalized since last visit: No Secondary Verification Process Completed: Yes Has Dressing in Place as Prescribed: Yes Patient Requires Transmission-Based Precautions: No Pain Present Now: No Patient Has Alerts: No Electronic Signature(s) Signed: 10/22/2021 4:31:39 PM By: Levora Dredge Entered By: Levora Dredge on 10/22/2021 10:02:49 [name] ([id number]) -------------------------------------------------------------------------------- Clinic Level of Care Assessment Details Patient Name: DREDYN, [name]. Date of Service: 10/22/2021 9:45 AM Medical Record Number: [id number] Patient Account Number: [id number] Date of Birth/Sex: 1961-10-15 (61 y.o. M) Treating RN: Levora Dredge Primary Care Cadan Maggart: Karl Ito Other Clinician: Referring Lyrick Worland: Karl Ito Treating Humna Moorehouse/Extender: Yaakov Guthrie in Treatment: 11 Clinic Level of Care Assessment Items TOOL 1 Quantity Score '[]'  - Use when EandM and Procedure is performed on INITIAL visit 0 ASSESSMENTS - Nursing Assessment / Reassessment '[]'  - General Physical Exam (combine w/ comprehensive assessment (listed just below)  when performed on new 0 pt. evals) '[]'  - 0 Comprehensive Assessment (HX, ROS, Risk Assessments, Wounds Hx, etc.) ASSESSMENTS - Wound and Skin Assessment / Reassessment '[]'  - Dermatologic / Skin Assessment (not related to wound area) 0 ASSESSMENTS - Ostomy and/or Continence Assessment and Care '[]'  - Incontinence Assessment and Management 0 '[]'  - 0 Ostomy Care Assessment and Management (repouching, etc.) PROCESS - Coordination of Care '[]'  - Simple Patient / Family Education for ongoing care 0 '[]'  - 0 Complex (extensive) Patient / Family Education for ongoing care '[]'  - 0 Staff obtains Programmer, systems, Records, Test Results / Process Orders '[]'  - 0 Staff telephones HHA, Nursing Homes / Clarify orders / etc '[]'  - 0 Routine Transfer to another Facility (non-emergent condition) '[]'  - 0 Routine Hospital Admission (non-emergent condition) '[]'  - 0 New Admissions / Biomedical engineer / Ordering NPWT, Apligraf, etc. '[]'  - 0 Emergency Hospital Admission (emergent condition) PROCESS - Special Needs '[]'  - Pediatric / Minor Patient Management 0 '[]'  - 0 Isolation Patient Management '[]'  - 0 Hearing / Language / Visual special needs '[]'  - 0 Assessment of Community assistance (transportation, D/C planning, etc.) '[]'  - 0 Additional assistance / Altered mentation '[]'  - 0 Support Surface(s) Assessment (bed, cushion, seat, etc.) INTERVENTIONS - Miscellaneous '[]'  - External ear exam 0 '[]'  - 0 Patient Transfer (multiple staff / Civil Service fast streamer / Similar devices) '[]'  - 0 Simple Staple / Suture removal (25 or less) '[]'  - 0 Complex Staple / Suture removal (26 or more) '[]'  - 0 Hypo/Hyperglycemic Management (do not check if billed separately) '[]'  - 0 Ankle / Brachial Index (ABI) - do not check if billed separately Has the patient been seen at the hospital within the last three years: Yes Total Score: 0 Level Of Care: ____ [name] ([id number]) Electronic Signature(s) Signed: 10/22/2021 4:31:39 PM By: Levora Dredge Entered By: Levora Dredge on 10/22/2021 10:37:11 [name] ([id number]) -------------------------------------------------------------------------------- Encounter Discharge Information Details Patient Name: [name], Derrick.  Date of Service: 10/22/2021 9:45 AM Medical Record Number: [id number] Patient Account Number: [id number] Date of Birth/Sex: [birth year] (61 y.o. M) Treating RN: Levora Dredge Primary Care Haeven Nickle: Karl Ito Other Clinician: Referring Asuzena Weis: Karl Ito Treating Rashay Barnette/Extender: Yaakov Guthrie in Treatment: 11 Encounter Discharge Information Items Post Procedure Vitals Discharge Condition: Stable Temperature (F): 97.8 Ambulatory Status: Ambulatory Pulse (bpm): 82 Discharge Destination: Home Respiratory Rate (breaths/min): 18 Transportation: Private Auto Blood Pressure (mmHg): 109/74 Accompanied By: daughter Schedule Follow-up Appointment: Yes Clinical Summary of Care: Electronic Signature(s) Signed: 10/22/2021 4:31:39 PM By: Levora Dredge Entered By: Levora Dredge on 10/22/2021 10:38:53 [name] ([id number]) -------------------------------------------------------------------------------- Lower Extremity Assessment Details Patient Name: [name], Derrick. Date of Service: 10/22/2021 9:45 AM Medical Record Number: [id number] Patient Account Number: [id number] Date of Birth/Sex: Feb 02, 1961 (61 y.o. M) Treating RN: Levora Dredge Primary Care Cierah Crader: Karl Ito Other Clinician: Referring Trell Secrist: Karl Ito Treating Yecenia Dalgleish/Extender: Yaakov Guthrie in Treatment: 11 Edema Assessment Assessed: [Left: No] Patrice Paradise: No] Edema: [Left: N] [Right: o] Calf Left: Right: Point of Measurement: 30 cm From Medial Instep 34.5 cm Ankle Left: Right: Point of Measurement: 10 cm From Medial Instep 21 cm Vascular Assessment Pulses: Dorsalis Pedis Palpable: [Left:Yes] Electronic Signature(s) Signed: 10/22/2021 4:31:39  PM By: Levora Dredge Entered By: Levora Dredge on 10/22/2021 10:10:17 [name] ([id number]) -------------------------------------------------------------------------------- Multi Wound Chart Details Patient Name: NICOLAE, VASEK. Date of Service: 10/22/2021 9:45 AM Medical Record Number: [id number] Patient Account Number: [id number] Date of Birth/Sex: 03/12/1961 (61 y.o. M) Treating RN: Levora Dredge Primary Care Jamilette Suchocki: Karl Ito Other Clinician: Referring Giorgio Chabot: Karl Ito Treating Aundrey [name]/Extender: Yaakov Guthrie in Treatment: 11 Vital Signs Height(in): 65 Pulse(bpm): 42 Weight(lbs): 180 Blood Pressure(mmHg): 109/74 Body Mass Index(BMI): 30 Temperature(F): 97.8 Respiratory Rate(breaths/min): 18 Photos: [N/A:N/A] Wound Location: Left, Distal, Circumferential Foot N/A N/A Wounding Event: Thermal Burn N/A N/A Primary Etiology: 2nd degree Burn N/A N/A Comorbid History: Coronary Artery Disease, N/A N/A Hypertension, Myocardial Infarction, Type II Diabetes Date Acquired: 09/14/2021 N/A N/A Weeks of Treatment: 5 N/A N/A Wound Status: Open N/A N/A Clustered Wound: Yes N/A N/A Measurements L x W x D (cm) 2.2x1.2x0.1 N/A N/A Area (cm) : 2.073 N/A N/A Volume (cm) : 0.207 N/A N/A % Reduction in Area: 92.50% N/A N/A % Reduction in Volume: 92.50% N/A N/A Classification: Full Thickness Without Exposed N/A N/A Support Structures Exudate Amount: Medium N/A N/A Exudate Type: Serosanguineous N/A N/A Exudate Color: red, brown N/A N/A Granulation Amount: Small (1-33%) N/A N/A Granulation Quality: Red, Pink N/A N/A Necrotic Amount: Large (67-100%) N/A N/A Exposed Structures: Fat Layer (Subcutaneous Tissue): N/A N/A Yes Fascia: No Tendon: No Muscle: No Joint: No Bone: No Epithelialization: None N/A N/A Treatment Notes Electronic Signature(s) Signed: 10/22/2021 4:31:39 PM By: Levora Dredge Entered By: Levora Dredge on 10/22/2021 10:19:11 [name] ([id number]) -------------------------------------------------------------------------------- Multi-Disciplinary Care Plan Details Patient Name: ERLIN, GARDELLA. Date of Service: 10/22/2021 9:45 AM Medical Record Number: [id number] Patient Account Number: [id number] Date of Birth/Sex: Nov 07, 1960 (61 y.o. M) Treating RN: Levora Dredge Primary Care Toi Stelly: Karl Ito Other Clinician: Referring Gurjit Loconte: Karl Ito Treating Brandi Armato/Extender: Yaakov Guthrie in Treatment: 11 Active Inactive Wound/Skin Impairment Nursing Diagnoses: Knowledge deficit related to ulceration/compromised skin integrity Goals: Patient/caregiver will verbalize understanding of skin care regimen Date Initiated: 08/06/2021 Date Inactivated: 08/13/2021 Target Resolution Date: 09/06/2021 Goal Status: Met Ulcer/skin breakdown will have a volume reduction of 30% by week 4 Date Initiated: 08/06/2021 Target Resolution Date: 10/06/2021 Goal Status: Active Ulcer/skin breakdown will have a  volume reduction of 50% by week 8 Date Initiated: 08/06/2021 Target Resolution Date: 11/06/2021 Goal Status: Active Ulcer/skin breakdown will have a volume reduction of 80% by week 12 Date Initiated: 08/06/2021 Target Resolution Date: 12/07/2021 Goal Status: Active Ulcer/skin breakdown will heal within 14 weeks Date Initiated: 08/06/2021 Target Resolution Date: 01/04/2022 Goal Status: Active Interventions: Assess patient/caregiver ability to obtain necessary supplies Assess patient/caregiver ability to perform ulcer/skin care regimen upon admission and as needed Assess ulceration(s) every visit Notes: Electronic Signature(s) Signed: 10/22/2021 4:31:39 PM By: Levora Dredge Entered By: Levora Dredge on 10/22/2021 10:18:48 TEX, CONROY ([id number]) -------------------------------------------------------------------------------- Pain Assessment Details Patient Name: ALAKAI, MACBRIDE. Date of Service:  10/22/2021 9:45 AM Medical Record Number: [id number] Patient Account Number: [id number] Date of Birth/Sex: 06/28/1961 (61 y.o. M) Treating RN: Levora Dredge Primary Care Taitum Menton: Karl Ito Other Clinician: Referring Kathe Wirick: Karl Ito Treating Donika Butner/Extender: Yaakov Guthrie in Treatment: 11 Active Problems Location of Pain Severity and Description of Pain Patient Has Paino No Site Locations Rate the pain. Current Pain Level: 0 Pain Management and Medication Current Pain Management: Electronic Signature(s) Signed: 10/22/2021 4:31:39 PM By: Levora Dredge Entered By: Levora Dredge on 10/22/2021 10:03:24 [name] ([id number]) -------------------------------------------------------------------------------- Patient/Caregiver Education Details Patient Name: TYQUARIUS, PAGLIA. Date of Service: 10/22/2021 9:45 AM Medical Record Number: [id number] Patient Account Number: [id number] Date of Birth/Gender: 1961/04/27 (61 y.o. M) Treating RN: Levora Dredge Primary Care Physician: Karl Ito Other Clinician: Referring Physician: Karl Ito Treating Physician/Extender: Yaakov Guthrie in Treatment: 11 Education Assessment Education Provided To: Patient and Caregiver Education Topics Provided Wound/Skin Impairment: Handouts: Caring for Your Ulcer Methods: Explain/Verbal Responses: State content correctly Electronic Signature(s) Signed: 10/22/2021 4:31:39 PM By: Levora Dredge Entered By: Levora Dredge on 10/22/2021 10:37:37 WOODFIN, KISS ([id number]) -------------------------------------------------------------------------------- Wound Assessment Details Patient Name: HERON, PITCOCK. Date of Service: 10/22/2021 9:45 AM Medical Record Number: [id number] Patient Account Number: [id number] Date of Birth/Sex: 1961-10-12 (61 y.o. M) Treating RN: Levora Dredge Primary Care Delwyn Scoggin: Karl Ito Other Clinician: Referring Sabriel Borromeo: Karl Ito Treating  Nahom Carfagno/Extender: Yaakov Guthrie in Treatment: 11 Wound Status Wound Number: 6 Primary 2nd degree Burn Etiology: Wound Location: Left, Distal, Circumferential Foot Wound Status: Open Wounding Event: Thermal Burn Comorbid Coronary Artery Disease, Hypertension, Myocardial Date Acquired: 09/14/2021 History: Infarction, Type II Diabetes Weeks Of Treatment: 5 Clustered Wound: Yes Photos Wound Measurements Length: (cm) 2.2 Width: (cm) 1.2 Depth: (cm) 0.1 Area: (cm) 2.073 Volume: (cm) 0.207 % Reduction in Area: 92.5% % Reduction in Volume: 92.5% Epithelialization: None Tunneling: No Undermining: No Wound Description Classification: Full Thickness Without Exposed Support Structures Exudate Amount: Medium Exudate Type: Serosanguineous Exudate Color: red, brown Foul Odor After Cleansing: No Slough/Fibrino Yes Wound Bed Granulation Amount: Small (1-33%) Exposed Structure Granulation Quality: Red, Pink Fascia Exposed: No Necrotic Amount: Large (67-100%) Fat Layer (Subcutaneous Tissue) Exposed: Yes Necrotic Quality: Adherent Slough Tendon Exposed: No Muscle Exposed: No Joint Exposed: No Bone Exposed: No Treatment Notes Wound #6 (Foot) Wound Laterality: Left, Distal, Circumferential Cleanser Soap and Water Discharge Instruction: Gently cleanse wound with antibacterial soap, rinse and pat dry prior to dressing wounds Peri-Wound Care LENWOOD, BALSAM ([id number]) Topical Primary Dressing Gauze Santyl Collagenase Ointment, 30 (gm), tube Secondary Dressing Secured With Compression Wrap Compression Stockings Add-Ons Electronic Signature(s) Signed: 10/22/2021 4:31:39 PM By: Levora Dredge Entered By: Levora Dredge on 10/22/2021 10:08:53 [name] ([id number]) -------------------------------------------------------------------------------- Vitals Details Patient Name: [name] Date of Service: 10/22/2021 9:45 AM Medical Record Number:  [id number] Patient Account Number: [id number]  Date of Birth/Sex: [birth year] (60 y.o. M) Treating RN: Levora Dredge Primary Care Adlene Adduci: Karl Ito Other Clinician: Referring Aerie Donica: Karl Ito Treating Arabel Barcenas/Extender: Yaakov Guthrie in Treatment: 11 Vital Signs Time Taken: 10:00 Temperature (F): 97.8 Height (in): 65 Pulse (bpm): 82 Weight (lbs): 180 Respiratory Rate (breaths/min): 18 Body Mass Index (BMI): 30 Blood Pressure (mmHg): 109/74 Reference Range: 80 - 120 mg / dl Electronic Signature(s) Signed: 10/22/2021 4:31:39 PM By: Levora Dredge Entered By: Levora Dredge on 10/22/2021 10:03:11

## 2021-10-29 ENCOUNTER — Telehealth: Payer: Self-pay | Admitting: Nurse Practitioner

## 2021-10-29 ENCOUNTER — Encounter: Payer: Self-pay | Admitting: Nurse Practitioner

## 2021-10-29 ENCOUNTER — Encounter (HOSPITAL_BASED_OUTPATIENT_CLINIC_OR_DEPARTMENT_OTHER): Payer: Medicare Other | Admitting: Internal Medicine

## 2021-10-29 ENCOUNTER — Other Ambulatory Visit: Payer: Self-pay

## 2021-10-29 ENCOUNTER — Ambulatory Visit (INDEPENDENT_AMBULATORY_CARE_PROVIDER_SITE_OTHER): Payer: Commercial Managed Care - HMO | Admitting: Nurse Practitioner

## 2021-10-29 ENCOUNTER — Encounter: Payer: Self-pay | Admitting: *Deleted

## 2021-10-29 VITALS — BP 114/72 | HR 88 | Temp 98.0°F | Resp 12 | Ht 65.0 in | Wt 166.2 lb

## 2021-10-29 DIAGNOSIS — Z1211 Encounter for screening for malignant neoplasm of colon: Secondary | ICD-10-CM

## 2021-10-29 DIAGNOSIS — L97522 Non-pressure chronic ulcer of other part of left foot with fat layer exposed: Secondary | ICD-10-CM | POA: Diagnosis not present

## 2021-10-29 DIAGNOSIS — Z794 Long term (current) use of insulin: Secondary | ICD-10-CM

## 2021-10-29 DIAGNOSIS — E08621 Diabetes mellitus due to underlying condition with foot ulcer: Secondary | ICD-10-CM | POA: Diagnosis not present

## 2021-10-29 DIAGNOSIS — I5022 Chronic systolic (congestive) heart failure: Secondary | ICD-10-CM | POA: Insufficient documentation

## 2021-10-29 DIAGNOSIS — Z0000 Encounter for general adult medical examination without abnormal findings: Secondary | ICD-10-CM

## 2021-10-29 DIAGNOSIS — R197 Diarrhea, unspecified: Secondary | ICD-10-CM | POA: Diagnosis not present

## 2021-10-29 DIAGNOSIS — I480 Paroxysmal atrial fibrillation: Secondary | ICD-10-CM

## 2021-10-29 DIAGNOSIS — Z23 Encounter for immunization: Secondary | ICD-10-CM | POA: Diagnosis not present

## 2021-10-29 DIAGNOSIS — E11621 Type 2 diabetes mellitus with foot ulcer: Secondary | ICD-10-CM | POA: Diagnosis not present

## 2021-10-29 DIAGNOSIS — Z8679 Personal history of other diseases of the circulatory system: Secondary | ICD-10-CM | POA: Insufficient documentation

## 2021-10-29 DIAGNOSIS — Z720 Tobacco use: Secondary | ICD-10-CM | POA: Diagnosis not present

## 2021-10-29 DIAGNOSIS — I10 Essential (primary) hypertension: Secondary | ICD-10-CM | POA: Diagnosis not present

## 2021-10-29 DIAGNOSIS — E11622 Type 2 diabetes mellitus with other skin ulcer: Secondary | ICD-10-CM

## 2021-10-29 DIAGNOSIS — E1169 Type 2 diabetes mellitus with other specified complication: Secondary | ICD-10-CM

## 2021-10-29 DIAGNOSIS — R3121 Asymptomatic microscopic hematuria: Secondary | ICD-10-CM | POA: Diagnosis not present

## 2021-10-29 DIAGNOSIS — Z125 Encounter for screening for malignant neoplasm of prostate: Secondary | ICD-10-CM

## 2021-10-29 DIAGNOSIS — L97518 Non-pressure chronic ulcer of other part of right foot with other specified severity: Secondary | ICD-10-CM

## 2021-10-29 LAB — CBC WITH DIFFERENTIAL/PLATELET
Basophils Absolute: 0.1 10*3/uL (ref 0.0–0.1)
Basophils Relative: 0.5 % (ref 0.0–3.0)
Eosinophils Absolute: 0.3 10*3/uL (ref 0.0–0.7)
Eosinophils Relative: 2.5 % (ref 0.0–5.0)
HCT: 50.3 % (ref 39.0–52.0)
Hemoglobin: 16.1 g/dL (ref 13.0–17.0)
Lymphocytes Relative: 11.5 % — ABNORMAL LOW (ref 12.0–46.0)
Lymphs Abs: 1.2 10*3/uL (ref 0.7–4.0)
MCHC: 32 g/dL (ref 30.0–36.0)
MCV: 85.8 fl (ref 78.0–100.0)
Monocytes Absolute: 0.9 10*3/uL (ref 0.1–1.0)
Monocytes Relative: 8.4 % (ref 3.0–12.0)
Neutro Abs: 8.1 10*3/uL — ABNORMAL HIGH (ref 1.4–7.7)
Neutrophils Relative %: 77.1 % — ABNORMAL HIGH (ref 43.0–77.0)
Platelets: 110 10*3/uL — ABNORMAL LOW (ref 150.0–400.0)
RBC: 5.87 Mil/uL — ABNORMAL HIGH (ref 4.22–5.81)
RDW: 15.5 % (ref 11.5–15.5)
WBC: 10.5 10*3/uL (ref 4.0–10.5)

## 2021-10-29 LAB — LIPID PANEL
Cholesterol: 92 mg/dL (ref 0–200)
HDL: 33.9 mg/dL — ABNORMAL LOW (ref >39.00)
LDL Cholesterol: 40 mg/dL (ref 0–99)
NonHDL: 57.86
Total CHOL/HDL Ratio: 3
Triglycerides: 89 mg/dL (ref 0.0–149.0)
VLDL: 17.8 mg/dL (ref 0.0–40.0)

## 2021-10-29 LAB — COMPREHENSIVE METABOLIC PANEL
ALT: 25 U/L (ref 0–53)
AST: 28 U/L (ref 0–37)
Albumin: 4 g/dL (ref 3.5–5.2)
Alkaline Phosphatase: 105 U/L (ref 39–117)
BUN: 20 mg/dL (ref 6–23)
CO2: 27 mEq/L (ref 19–32)
Calcium: 8.5 mg/dL (ref 8.4–10.5)
Chloride: 110 mEq/L (ref 96–112)
Creatinine, Ser: 1.36 mg/dL (ref 0.40–1.50)
GFR: 56.61 mL/min — ABNORMAL LOW (ref >60.00)
Glucose, Bld: 219 mg/dL — ABNORMAL HIGH (ref 70–99)
Potassium: 4.3 mEq/L (ref 3.5–5.1)
Sodium: 143 mEq/L (ref 135–145)
Total Bilirubin: 0.5 mg/dL (ref 0.2–1.2)
Total Protein: 6.5 g/dL (ref 6.0–8.3)

## 2021-10-29 LAB — POCT URINALYSIS DIP (CLINITEK)
Bilirubin, UA: NEGATIVE
Glucose, UA: >=1000 mg/dL — AB
Ketones, POC UA: NEGATIVE mg/dL
Leukocytes, UA: NEGATIVE
Nitrite, UA: NEGATIVE
POC PROTEIN,UA: 100 — AB
Spec Grav, UA: 1.02 (ref 1.010–1.025)
Urobilinogen, UA: 0.2 E.U./dL
pH, UA: 5.5 (ref 5.0–8.0)

## 2021-10-29 LAB — PSA, MEDICARE: PSA: 1.82 ng/ml (ref 0.10–4.00)

## 2021-10-29 LAB — URINALYSIS, MICROSCOPIC ONLY

## 2021-10-29 MED ORDER — GABAPENTIN 300 MG PO CAPS: ORAL_CAPSULE | ORAL | 3 refills | Status: DC

## 2021-10-29 NOTE — Progress Notes (Signed)
Established Patient Office Visit  Subjective:  Patient ID: [name], male    DOB: 1961/04/17  Age: 61 y.o. MRN: [id number]  CC:  Chief Complaint  Patient presents with   Annual Exam    HPI ELIGH RYBACKI presents for complete physical and follow up of chronic conditions.  Immunizations: -Tetanus: unsure thinks he had it in 2018 -Influenza: Refused -Covid-19: Refused -Shingles: Refused -Pneumonia: Needs second one. PCV 20  -HPV: Aged out  Diet: [place]. Twice a day. No snack. Does drink soda, cut back because of sugar. Rarely drinks coffee maybe 1-2 cups a week. Exercise: No regular exercise. Has 7 grandkids that keep you busy.  Eye exam: Completes annually. Got it last week. [facility] Dr. Brigitte Pulse Dental exam:  not utd  Colonoscopy: Never done it order for [place]  PSA: Due  Lung Cancer Screening: States that his cardiologist prescirbed chantix. He has not gotten the medication yet  Cardiology: Dr. Cecille Rubin every 3-4 months and was seen on 10/27/2021 Echo in [place] clinic: Dr. Kalman Shan  saw them on 10/22/2021 and then today has an appointment with her later for wounds on the left first and second toes. Patient states that he burned his foot because of decreased feeling.  Wounds on lower legs have healed nicely.  Psychiatry: Dr. Laruth Bouchard once a month. Virtual  Endocrine: Dr. Danie Binder. Every 3-4 months. Checks glucose at least daily.  Podiatry: Dr. Cannon Kettle every 3 months for checks and toe nail trims  OSA: Shakeel Tabish. CPAP most of the time, except when he is sick  Past Medical History:  Diagnosis Date   Abrasion L FOREARM   Atrial fibrillation ([place])    a. initially occurring in post-op setting in 05/2016, started on Eliquis b. 08/2016: s/p clipping of atrial appendage at time of CABG --> Eliquis switched to Coumadin   CAD (coronary artery disease)    a. 05/2016: NSTEMI, cath showing 3v disease with CABG recommended  once MRSA infection resolved. b. 08/2016: CABG with LIMA-LAD, SVG-D1, and SVG-RI.   Cancer ([place])    basal cell carcinoma of the skin   Diabetes mellitus without complication ([place])    CHENIDPO(242.3)    Hematuria, microscopic "SINCE I WAS A KID"   Hyperlipidemia    Hypertension    Ischemic cardiomyopathy    a. 05/2016: echo w/ EF of 25-35% and akinesis of the basal-midinferolateral and inferior myocardium.   Lactose intolerance    Myocardial infarction ([place])    Respiratory failure (H[facility])    Rotator cuff tear RIGHT   Tobacco use    a. quit in 05/2016 following MI    Past Surgical History:  Procedure Laterality Date   AMPUTATION TOE Left 02/06/2020   5th ray amputation with graph [facility])   CARDIAC CATHETERIZATION N/A 06/16/2016   Procedure: Left Heart Cath and Coronary Angiography;  Surgeon: Troy Sine, MD;  Location: [facility];  Service: Cardiovascular;  Laterality: N/A;   CLIPPING OF ATRIAL APPENDAGE N/A 08/24/2016   Procedure: CLIPPING OF ATRIAL APPENDAGE;  Surgeon: Ivin Poot, MD;  Location: [place];  Service: Open Heart Surgery;  Laterality: N/A;   CORONARY ARTERY BYPASS GRAFT N/A 08/24/2016   Procedure: CORONARY ARTERY BYPASS GRAFTING (CABG) x three,  using left internal mammary artery and right leg greater saphenous vein harvested endoscopically;  Surgeon: Ivin Poot, MD;  Location: [facility];  Service: Open Heart Surgery;  Laterality: N/A;   HERNIA REPAIR  X2   INCISION AND DRAINAGE ABSCESS Left 06/10/2016   Procedure: INCISION AND DRAINAGE LEFT AXILLARY ABSCESS;  Surgeon: Autumn Messing III, MD;  Location: [facility];  Service: General;  Laterality: Left;   ROTATOR CUFF REPAIR Bilateral    TEE WITHOUT CARDIOVERSION N/A 08/24/2016   Procedure: TRANSESOPHAGEAL ECHOCARDIOGRAM (TEE);  Surgeon: Ivin Poot, MD;  Location: [place];  Service: Open Heart Surgery;  Laterality: N/A;    Family History  Problem Relation Age of Onset   Hypertension Mother    Hypertension Father     Sudden death Father 72       at age of death   Diabetes Father    Heart attack Father    Heart attack Sister    Cancer Maternal Aunt        breast x2 aunts    Social History   Socioeconomic History   Marital status: Married    Spouse name: Not on file   Number of children: Not on file   Years of education: Not on file   Highest education level: Not on file  Occupational History   Occupation: Hotel manager    Comment: supervisor  Tobacco Use   Smoking status: Every Day    Packs/day: 2.00    Years: 40.00    Pack years: 80.00    Types: Cigarettes    Last attempt to quit: 09/25/2019    Years since quitting: 2.0   Smokeless tobacco: Never  Substance and Sexual Activity   Alcohol use: No   Drug use: No   Sexual activity: Not on file  Other Topics Concern   Not on file  Social History Narrative   Not on file   Social Determinants of Health   Financial Resource Strain: Not on file  Food Insecurity: Not on file  Transportation Needs: Not on file  Physical Activity: Not on file  Stress: Not on file  Social Connections: Not on file  Intimate Partner Violence: Not on file    Outpatient Medications Prior to Visit  Medication Sig Dispense Refill   acetaminophen (TYLENOL) 500 MG tablet Take 1,000 mg by mouth every 6 (six) hours as needed for headache.     aspirin 81 MG chewable tablet Chew 1 tablet (81 mg total) by mouth daily. 30 tablet 3   atorvastatin (LIPITOR) 80 MG tablet Take 1 tablet (80 mg total) by mouth at bedtime. 90 tablet 3   citalopram (CELEXA) 20 MG tablet Take 1 tablet by mouth daily.     ezetimibe (ZETIA) 10 MG tablet Take 1 tablet by mouth daily.     furosemide (LASIX) 20 MG tablet Take 20 mg by mouth daily as needed.     gabapentin (NEURONTIN) 100 MG capsule Take 600 mg by mouth at bedtime.     glucose blood test strip 1 each by Other route as needed for other. For one touch ultra meter. DX: E11.9 100 each 3   insulin aspart protamine - aspart  (NOVOLOG MIX 70/30 FLEXPEN) (70-30) 100 UNIT/ML FlexPen Inject 50 units before breakfast and 70 units before dinner     Insulin Pen Needle 32G X 4 MM MISC Use to inject Insulin daily. Dx: E11.9 100 each 3   Insulin Syringe-Needle U-100 (INSULIN SYRINGE .3CC/31GX5/16") 31G X 5/16" 0.3 ML MISC 1 Units by Does not apply route 2 (two) times daily. 100 each 2   JARDIANCE 25 MG TABS tablet Take 25 mg by mouth daily.     Lancets (ONETOUCH ULTRASOFT) lancets  Use as instructed 100 each 12   metoprolol succinate (TOPROL-XL) 100 MG 24 hr tablet Take 100 mg by mouth daily.     OZEMPIC, 0.25 OR 0.5 MG/DOSE, 2 MG/1.5ML SOPN Inject 0.5 mLs into the skin.     sacubitril-valsartan (ENTRESTO) 24-26 MG Take 0.5 tablets by mouth 2 (two) times daily.     SANTYL ointment Apply 1 application topically daily.     traZODone (DESYREL) 50 MG tablet Take by mouth.     metoCLOPramide (REGLAN) 5 MG tablet Take by mouth. (Patient not taking: Reported on 10/29/2021)     nitroGLYCERIN (NITROSTAT) 0.4 MG SL tablet Place 1 tablet (0.4 mg total) under the tongue every 5 (five) minutes as needed for chest pain. 25 tablet 3   Varenicline Tartrate, Starter, 0.5 MG X 11 & 1 MG X 42 TBPK Take one 0.5mg  tablet by mouth once daily for 3 days. THEN increase to one 0.5mg  tablet twice daily for 4 days. THEN increase to one 1mg  tablet twice daily. (Patient not taking: Reported on 10/29/2021)     doxycycline (VIBRA-TABS) 100 MG tablet Take 1 tablet (100 mg total) by mouth 2 (two) times daily. 14 tablet 0   liraglutide (VICTOZA) 18 MG/3ML SOPN Inject 1.8 mg once daily     sertraline (ZOLOFT) 25 MG tablet Take by mouth. Take 2 tablets in the morning and 1 tablet at bedtime     No facility-administered medications prior to visit.    Allergies  Allergen Reactions   Latex Rash    ROS Review of Systems  Constitutional:  Negative for chills, fatigue and fever.  Respiratory:  Positive for cough (improving). Negative for shortness of breath.    Cardiovascular:  Negative for chest pain, palpitations and leg swelling.  Gastrointestinal:  Positive for diarrhea, nausea and vomiting. Negative for abdominal pain.  Genitourinary:  Positive for hematuria (per report). Negative for dysuria, frequency, penile pain, penile swelling, scrotal swelling and testicular pain.       Nocturia x1 intermittent  Neurological:  Negative for dizziness, weakness, light-headedness, numbness and headaches.  Psychiatric/Behavioral:  Negative for hallucinations and suicidal ideas.      Objective:    Physical Exam Vitals and nursing note reviewed. Exam conducted with a chaperone present [facility], RMA).  Constitutional:      Appearance: Normal appearance.  HENT:     Right Ear: Tympanic membrane, ear canal and external ear normal.     Left Ear: Tympanic membrane, ear canal and external ear normal.     Mouth/Throat:     Mouth: Mucous membranes are moist.     Pharynx: Oropharynx is clear.  Eyes:     Extraocular Movements: Extraocular movements intact.     Pupils: Pupils are equal, round, and reactive to light.  Neck:     Thyroid: No thyroid mass, thyromegaly or thyroid tenderness.  Cardiovascular:     Rate and Rhythm: Normal rate and regular rhythm.     Pulses: Normal pulses.          Dorsalis pedis pulses are 2+ on the right side and 2+ on the left side.  Pulmonary:     Effort: Pulmonary effort is normal.     Breath sounds: Normal breath sounds.  Abdominal:     General: Bowel sounds are increased. There is no distension.     Palpations: Abdomen is soft. There is no mass.     Tenderness: There is no abdominal tenderness.  Lymphadenopathy:     Cervical: No cervical  adenopathy.  Skin:    General: Skin is warm and dry.     Comments: Has 2 burn wounds to distal end of left first and second toe.  Neurological:     General: No focal deficit present.     Mental Status: He is alert.  Psychiatric:        Mood and Affect: Mood normal.         Behavior: Behavior normal.        Thought Content: Thought content normal.        Judgment: Judgment normal.    There were no vitals taken for this visit. Wt Readings from Last 3 Encounters:  07/29/21 177 lb 4 oz (80.4 kg)  07/22/20 176 lb 8 oz (80.1 kg)  07/03/20 173 lb 4 oz (78.6 kg)     Health Maintenance Due  Topic Date Due   Hepatitis C Screening  Never done   TETANUS/TDAP  Never done   COLONOSCOPY (Pts 45-58yrs Insurance coverage will need to be confirmed)  Never done   Zoster Vaccines- Shingrix (1 of 2) Never done   Pneumococcal Vaccine 23-71 Years old (2 - PCV) 09/25/2020   HEMOGLOBIN A1C  12/31/2020   FOOT EXAM  07/22/2021    There are no preventive care reminders to display for this patient.  Lab Results  Component Value Date   TSH 1.57 01/18/2017   Lab Results  Component Value Date   WBC 8.6 07/29/2021   HGB 15.9 07/29/2021   HCT 48.4 07/29/2021   MCV 86.7 07/29/2021   PLT 119.0 (L) 07/29/2021   Lab Results  Component Value Date   NA 144 07/29/2021   K 4.5 07/29/2021   CO2 27 07/29/2021   GLUCOSE 203 (H) 07/29/2021   BUN 23 07/29/2021   CREATININE 1.30 07/29/2021   BILITOT 0.6 07/29/2021   ALKPHOS 126 (H) 07/29/2021   AST 20 07/29/2021   ALT 15 07/29/2021   PROT 6.7 07/29/2021   ALBUMIN 4.0 07/29/2021   CALCIUM 8.8 07/29/2021   ANIONGAP 8 12/04/2019   GFR 59.86 (L) 07/29/2021   Lab Results  Component Value Date   CHOL 103 07/29/2021   Lab Results  Component Value Date   HDL 31.80 (L) 07/29/2021   Lab Results  Component Value Date   LDLCALC 53 07/29/2021   Lab Results  Component Value Date   TRIG 92.0 07/29/2021   Lab Results  Component Value Date   CHOLHDL 3 07/29/2021   Lab Results  Component Value Date   HGBA1C 9.0 (A) 07/03/2020      Assessment & Plan:   Problem List Items Addressed This Visit       Cardiovascular and Mediastinum   Essential hypertension    Patient currently maintained on metoprolol, Entresto for  blood pressure he is managed by cardiology Dr. Cecille Rubin.  Sees cardiologist approximately 3 to 4 months.  Just seen on 10/27/2021.  States they have been echo cardiogram scheduled in February 2023.  Continue taking medications as prescribed and follow-up as recommended to cardiology      PAF (paroxysmal atrial fibrillation) ([place])    Managed by cardiology.  Patient underwent an ablation was placed back in a normal sinus rhythm.  Per EP was taken off apixaban after period of time post ablation.         Endocrine   Type 2 diabetes mellitus with other specified complication [facility])    Patient currently managed by endocrinology.  Sees Dr. Wallene Dales.  Seen  about every 3 to 4 months checks glucose at least daily.  Continue medications as prescribed and follow-up with endocrinologist      Relevant Medications   OZEMPIC, 0.25 OR 0.5 MG/DOSE, 2 MG/1.5ML SOPN   Diabetic foot ulcer ([place])   Relevant Medications   OZEMPIC, 0.25 OR 0.5 MG/DOSE, 2 MG/1.5ML SOPN     Genitourinary   Asymptomatic microscopic hematuria    Positive red blood cells on UA today patient asymptomatic does have history of heavy smoking.  States he has had asymptomatic hematuria prior to smoking.  We will send off for microscopy      Relevant Orders   Urinalysis, microscopic only (Completed)     Other   Tobacco abuse    Looking.  Did speak with cardiologist yesterday and had him start him on Chantix to help with his smoking cessation.  Take medication as prescribed follow-up as recommended      Relevant Orders   POCT URINALYSIS DIP (CLINITEK) (Completed)   Ambulatory Referral Lung Cancer Screening [facility]   Diarrhea    Patient has not been having diarrhea and vomiting for some time.  States cardiologist thinks is related to gastroparesis with prescribed Reglan.  Does have follow-up with endocrinologist and recently started on Ozempic could be multifactorial.  We will send off for stool cultures and check  electrolytes today.  Exam benign in office.  Patient was noted to have weight loss since last office visit.      Relevant Orders   Ova and parasite examination   Gastrointestinal Pathogen Panel PCR   Other Visit Diagnoses     Preventative health care    -  Primary   Relevant Orders   CBC with Differential/Platelet (Completed)   Comprehensive metabolic panel (Completed)   Lipid panel (Completed)   Need for vaccination against Streptococcus pneumoniae       Relevant Orders   Pneumococcal conjugate vaccine 20-valent (Prevnar 20) (Completed)   Screening for colon cancer       Relevant Orders   Ambulatory referral to Gastroenterology   Screening for prostate cancer       Relevant Orders   PSA, Medicare (Completed)       Meds ordered this encounter  Medications   gabapentin (NEURONTIN) 300 MG capsule    Sig: 2 capsules at bedtime    Dispense:  60 capsule    Refill:  3    Follow-up: Return in about 4 months (around 02/26/2022) for Recheck.  This visit occurred during the SARS-CoV-2 public health emergency.  Safety protocols were in place, including screening questions prior to the visit, additional usage of staff PPE, and extensive cleaning of exam room while observing appropriate contact time as indicated for disinfecting solutions.     Romilda Garret, NP

## 2021-10-29 NOTE — Assessment & Plan Note (Signed)
Patient currently managed by endocrinology.  Sees Dr. Wallene Dales.  Seen about every 3 to 4 months checks glucose at least daily.  Continue medications as prescribed and follow-up with endocrinologist

## 2021-10-29 NOTE — Assessment & Plan Note (Addendum)
Patient has not been having diarrhea and vomiting for some time.  States cardiologist thinks is related to gastroparesis with prescribed Reglan.  Does have follow-up with endocrinologist and recently started on Ozempic could be multifactorial.  We will send off for stool cultures and check electrolytes today.  Exam benign in office.  Patient was noted to have weight loss since last office visit.

## 2021-10-29 NOTE — Telephone Encounter (Deleted)
Patient missed appointment this morning. This was for a hospital follow up after an admission and discharge. Can we reach out to the patient and see if they can reschedule to be seen please

## 2021-10-29 NOTE — Assessment & Plan Note (Addendum)
Managed by cardiology.  Patient underwent an ablation was placed back in a normal sinus rhythm.  Per EP was taken off apixaban after period of time post ablation.

## 2021-10-29 NOTE — Telephone Encounter (Signed)
Sent in error

## 2021-10-29 NOTE — Assessment & Plan Note (Signed)
Looking.  Did speak with cardiologist yesterday and had him start him on Chantix to help with his smoking cessation.  Take medication as prescribed follow-up as recommended

## 2021-10-29 NOTE — Patient Instructions (Signed)
Nice to see you today Will be in touch with the lab results Follow up with me in 4 months, sooner if needed  Keep all the upcoming appts with specialists

## 2021-10-29 NOTE — Assessment & Plan Note (Signed)
Positive red blood cells on UA today patient asymptomatic does have history of heavy smoking.  States he has had asymptomatic hematuria prior to smoking.  We will send off for microscopy

## 2021-10-29 NOTE — Assessment & Plan Note (Signed)
Patient currently maintained on metoprolol, Entresto for blood pressure he is managed by cardiology Dr. Cecille Rubin.  Sees cardiologist approximately 3 to 4 months.  Just seen on 10/27/2021.  States they have been echo cardiogram scheduled in February 2023.  Continue taking medications as prescribed and follow-up as recommended to cardiology

## 2021-10-30 ENCOUNTER — Telehealth: Payer: Self-pay

## 2021-10-30 NOTE — Progress Notes (Signed)
YASHAR, INCLAN ([id number]) Visit Report for 10/29/2021 Arrival Information Details Patient Name: [name], [name]. Date of Service: 10/29/2021 12:30 PM Medical Record Number: [id number] Patient Account Number: [id number] Date of Birth/Sex: Dec 25, 1960 (61 y.o. M) Treating RN: Carlene Coria Primary Care Quartez Lagos: Karl Ito Other Clinician: Referring Esaw Knippel: Karl Ito Treating Sienna Stonehocker/Extender: Yaakov Guthrie in Treatment: 12 Visit Information History Since Last Visit All ordered tests and consults were completed: No Patient Arrived: Ambulatory Added or deleted any medications: No Arrival Time: 12:37 Any new allergies or adverse reactions: No Accompanied By: self Had a fall or experienced change in No Transfer Assistance: None activities of daily living that may affect Patient Identification Verified: Yes risk of falls: Secondary Verification Process Completed: Yes Signs or symptoms of abuse/neglect since last visito No Patient Requires Transmission-Based Precautions: No Hospitalized since last visit: No Patient Has Alerts: No Implantable device outside of the clinic excluding No cellular tissue based products placed in the center since last visit: Has Dressing in Place as Prescribed: Yes Pain Present Now: No Electronic Signature(s) Signed: 10/29/2021 6:47:24 PM By: Carlene Coria RN Entered By: Carlene Coria on 10/29/2021 12:43:35 [name] ([id number]) -------------------------------------------------------------------------------- Clinic Level of Care Assessment Details Patient Name: JEVONTE, CLANTON. Date of Service: 10/29/2021 12:30 PM Medical Record Number: [id number] Patient Account Number: [id number] Date of Birth/Sex: 01/31/1961 (61 y.o. M) Treating RN: Carlene Coria Primary Care Navon Kotowski: Karl Ito Other Clinician: Referring Manasvini Whatley: Karl Ito Treating Kaira Stringfield/Extender: Yaakov Guthrie in Treatment: 12 Clinic Level of Care Assessment  Items TOOL 4 Quantity Score '[]'  - Use when only an EandM is performed on FOLLOW-UP visit 0 ASSESSMENTS - Nursing Assessment / Reassessment '[]'  - Reassessment of Co-morbidities (includes updates in patient status) 0 '[]'  - 0 Reassessment of Adherence to Treatment Plan ASSESSMENTS - Wound and Skin Assessment / Reassessment '[]'  - Simple Wound Assessment / Reassessment - one wound 0 '[]'  - 0 Complex Wound Assessment / Reassessment - multiple wounds '[]'  - 0 Dermatologic / Skin Assessment (not related to wound area) ASSESSMENTS - Focused Assessment '[]'  - Circumferential Edema Measurements - multi extremities 0 '[]'  - 0 Nutritional Assessment / Counseling / Intervention '[]'  - 0 Lower Extremity Assessment (monofilament, tuning fork, pulses) '[]'  - 0 Peripheral Arterial Disease Assessment (using hand held doppler) ASSESSMENTS - Ostomy and/or Continence Assessment and Care '[]'  - Incontinence Assessment and Management 0 '[]'  - 0 Ostomy Care Assessment and Management (repouching, etc.) PROCESS - Coordination of Care '[]'  - Simple Patient / Family Education for ongoing care 0 '[]'  - 0 Complex (extensive) Patient / Family Education for ongoing care '[]'  - 0 Staff obtains Programmer, systems, Records, Test Results / Process Orders '[]'  - 0 Staff telephones HHA, Nursing Homes / Clarify orders / etc '[]'  - 0 Routine Transfer to another Facility (non-emergent condition) '[]'  - 0 Routine Hospital Admission (non-emergent condition) '[]'  - 0 New Admissions / Biomedical engineer / Ordering NPWT, Apligraf, etc. '[]'  - 0 Emergency Hospital Admission (emergent condition) '[]'  - 0 Simple Discharge Coordination '[]'  - 0 Complex (extensive) Discharge Coordination PROCESS - Special Needs '[]'  - Pediatric / Minor Patient Management 0 '[]'  - 0 Isolation Patient Management '[]'  - 0 Hearing / Language / Visual special needs '[]'  - 0 Assessment of Community assistance (transportation, D/C planning, etc.) '[]'  - 0 Additional assistance / Altered  mentation '[]'  - 0 Support Surface(s) Assessment (bed, cushion, seat, etc.) INTERVENTIONS - Wound Cleansing / Measurement JONATAN, WILSEY F. ([id number]) '[]'  - 0 Simple Wound Cleansing - one wound '[]'  -  0 Complex Wound Cleansing - multiple wounds '[]'  - 0 Wound Imaging (photographs - any number of wounds) '[]'  - 0 Wound Tracing (instead of photographs) '[]'  - 0 Simple Wound Measurement - one wound '[]'  - 0 Complex Wound Measurement - multiple wounds INTERVENTIONS - Wound Dressings '[]'  - Small Wound Dressing one or multiple wounds 0 '[]'  - 0 Medium Wound Dressing one or multiple wounds '[]'  - 0 Large Wound Dressing one or multiple wounds '[]'  - 0 Application of Medications - topical '[]'  - 0 Application of Medications - injection INTERVENTIONS - Miscellaneous '[]'  - External ear exam 0 '[]'  - 0 Specimen Collection (cultures, biopsies, blood, body fluids, etc.) '[]'  - 0 Specimen(s) / Culture(s) sent or taken to Lab for analysis '[]'  - 0 Patient Transfer (multiple staff / Civil Service fast streamer / Similar devices) '[]'  - 0 Simple Staple / Suture removal (25 or less) '[]'  - 0 Complex Staple / Suture removal (26 or more) '[]'  - 0 Hypo / Hyperglycemic Management (close monitor of Blood Glucose) '[]'  - 0 Ankle / Brachial Index (ABI) - do not check if billed separately '[]'  - 0 Vital Signs Has the patient been seen at the hospital within the last three years: Yes Total Score: 0 Level Of Care: ____ Electronic Signature(s) Signed: 10/29/2021 6:47:24 PM By: Carlene Coria RN Entered By: Carlene Coria on 10/29/2021 13:01:15 [name] ([id number]) -------------------------------------------------------------------------------- Encounter Discharge Information Details Patient Name: JEORGE, REISTER. Date of Service: 10/29/2021 12:30 PM Medical Record Number: [id number] Patient Account Number: [id number] Date of Birth/Sex: 07/21/1961 (61 y.o. M) Treating RN: Carlene Coria Primary Care Cote Mayabb: Karl Ito Other  Clinician: Referring Evett Kassa: Karl Ito Treating Azalynn Maxim/Extender: Yaakov Guthrie in Treatment: 12 Encounter Discharge Information Items Post Procedure Vitals Discharge Condition: Stable Temperature (F): 97.7 Ambulatory Status: Ambulatory Pulse (bpm): 85 Discharge Destination: Home Respiratory Rate (breaths/min): 18 Transportation: Private Auto Blood Pressure (mmHg): 139/76 Accompanied By: self Schedule Follow-up Appointment: Yes Clinical Summary of Care: Patient Declined Electronic Signature(s) Signed: 10/29/2021 6:47:24 PM By: Carlene Coria RN Entered By: Carlene Coria on 10/29/2021 13:03:55 [name] ([id number]) -------------------------------------------------------------------------------- Lower Extremity Assessment Details Patient Name: MICHAL, CALLICOTT. Date of Service: 10/29/2021 12:30 PM Medical Record Number: [id number] Patient Account Number: [id number] Date of Birth/Sex: [birth year] (61 y.o. M) Treating RN: Carlene Coria Primary Care Wilver Tignor: Karl Ito Other Clinician: Referring Kewon Statler: Karl Ito Treating Shiro Ellerman/Extender: Yaakov Guthrie in Treatment: 12 Vascular Assessment Pulses: Dorsalis Pedis Palpable: [Left:Yes] Electronic Signature(s) Signed: 10/29/2021 6:47:24 PM By: Carlene Coria RN Entered By: Carlene Coria on 10/29/2021 12:50:13 [name] ([id number]) -------------------------------------------------------------------------------- Multi Wound Chart Details Patient Name: LAMARCO, GUDIEL. Date of Service: 10/29/2021 12:30 PM Medical Record Number: [id number] Patient Account Number: [id number] Date of Birth/Sex: July 09, 1961 (61 y.o. M) Treating RN: Carlene Coria Primary Care Donni Oglesby: Karl Ito Other Clinician: Referring Lakima Dona: Karl Ito Treating Valentine Kuechle/Extender: Yaakov Guthrie in Treatment: 12 Vital Signs Height(in): 65 Pulse(bpm): 85 Weight(lbs): 180 Blood Pressure(mmHg): 139/76 Body Mass  Index(BMI): 30 Temperature(F): 97.7 Respiratory Rate(breaths/min): 18 Photos: [N/A:N/A] Wound Location: Left, Distal, Circumferential Foot N/A N/A Wounding Event: Thermal Burn N/A N/A Primary Etiology: 2nd degree Burn N/A N/A Comorbid History: Coronary Artery Disease, N/A N/A Hypertension, Myocardial Infarction, Type II Diabetes Date Acquired: 09/14/2021 N/A N/A Weeks of Treatment: 6 N/A N/A Wound Status: Open N/A N/A Clustered Wound: Yes N/A N/A Measurements L x W x D (cm) 2.2x2x0.1 N/A N/A Area (cm) : 3.456 N/A N/A Volume (cm) : 0.346 N/A N/A % Reduction in Area: 87.40% N/A  N/A % Reduction in Volume: 87.40% N/A N/A Classification: Full Thickness Without Exposed N/A N/A Support Structures Exudate Amount: Medium N/A N/A Exudate Type: Serosanguineous N/A N/A Exudate Color: red, brown N/A N/A Granulation Amount: Small (1-33%) N/A N/A Granulation Quality: Red, Pink N/A N/A Necrotic Amount: Large (67-100%) N/A N/A Exposed Structures: Fat Layer (Subcutaneous Tissue): N/A N/A Yes Fascia: No Tendon: No Muscle: No Joint: No Bone: No Epithelialization: None N/A N/A Treatment Notes Electronic Signature(s) Signed: 10/29/2021 6:47:24 PM By: Carlene Coria RN Entered By: Carlene Coria on 10/29/2021 12:56:52 [name] ([id number]) -------------------------------------------------------------------------------- [place] Details Patient Name: SLADE, PIERPOINT. Date of Service: 10/29/2021 12:30 PM Medical Record Number: [id number] Patient Account Number: [id number] Date of Birth/Sex: 1961/01/16 (61 y.o. M) Treating RN: Carlene Coria Primary Care Dimitry Holsworth: Karl Ito Other Clinician: Referring Sheralee Qazi: Karl Ito Treating Sheriden Archibeque/Extender: Yaakov Guthrie in Treatment: 12 Active Inactive Wound/Skin Impairment Nursing Diagnoses: Knowledge deficit related to ulceration/compromised skin integrity Goals: Patient/caregiver will verbalize  understanding of skin care regimen Date Initiated: 08/06/2021 Date Inactivated: 08/13/2021 Target Resolution Date: 09/06/2021 Goal Status: Met Ulcer/skin breakdown will have a volume reduction of 30% by week 4 Date Initiated: 08/06/2021 Target Resolution Date: 10/06/2021 Goal Status: Active Ulcer/skin breakdown will have a volume reduction of 50% by week 8 Date Initiated: 08/06/2021 Target Resolution Date: 11/06/2021 Goal Status: Active Ulcer/skin breakdown will have a volume reduction of 80% by week 12 Date Initiated: 08/06/2021 Target Resolution Date: 12/07/2021 Goal Status: Active Ulcer/skin breakdown will heal within 14 weeks Date Initiated: 08/06/2021 Target Resolution Date: 01/04/2022 Goal Status: Active Interventions: Assess patient/caregiver ability to obtain necessary supplies Assess patient/caregiver ability to perform ulcer/skin care regimen upon admission and as needed Assess ulceration(s) every visit Notes: Electronic Signature(s) Signed: 10/29/2021 6:47:24 PM By: Carlene Coria RN Entered By: Carlene Coria on 10/29/2021 12:56:39 [name] ([id number]) -------------------------------------------------------------------------------- Pain Assessment Details Patient Name: LITTLE, WINTON. Date of Service: 10/29/2021 12:30 PM Medical Record Number: [id number] Patient Account Number: [id number] Date of Birth/Sex: 05-08-1961 (61 y.o. M) Treating RN: Carlene Coria Primary Care Lela Gell: Karl Ito Other Clinician: Referring Marvine Encalade: Karl Ito Treating Everitt Wenner/Extender: Yaakov Guthrie in Treatment: 12 Active Problems Location of Pain Severity and Description of Pain Patient Has Paino No Site Locations Pain Management and Medication Current Pain Management: Electronic Signature(s) Signed: 10/29/2021 6:47:24 PM By: Carlene Coria RN Entered By: Carlene Coria on 10/29/2021 12:44:50 [name]  ([id number]) -------------------------------------------------------------------------------- Patient/Caregiver Education Details Patient Name: NYLAN, NEVEL. Date of Service: 10/29/2021 12:30 PM Medical Record Number: [id number] Patient Account Number: [id number] Date of Birth/Gender: May 28, 1961 (61 y.o. M) Treating RN: Carlene Coria Primary Care Physician: Karl Ito Other Clinician: Referring Physician: Karl Ito Treating Physician/Extender: Yaakov Guthrie in Treatment: 12 Education Assessment Education Provided To: Patient Education Topics Provided Wound/Skin Impairment: Methods: Explain/Verbal Responses: State content correctly Electronic Signature(s) Signed: 10/29/2021 6:47:24 PM By: Carlene Coria RN Entered By: Carlene Coria on 10/29/2021 13:01:53 [name] ([id number]) -------------------------------------------------------------------------------- Wound Assessment Details Patient Name: RADEK, CARNERO. Date of Service: 10/29/2021 12:30 PM Medical Record Number: [id number] Patient Account Number: [id number] Date of Birth/Sex: 10-Jun-1961 (61 y.o. M) Treating RN: Carlene Coria Primary Care Rmani Kellogg: Karl Ito Other Clinician: Referring Dhanvin Szeto: Karl Ito Treating Darbi Chandran/Extender: Yaakov Guthrie in Treatment: 12 Wound Status Wound Number: 6 Primary 2nd degree Burn Etiology: Wound Location: Left, Distal, Circumferential Foot Wound Status: Open Wounding Event: Thermal Burn Comorbid Coronary Artery Disease, Hypertension, Myocardial Date Acquired: 09/14/2021 History: Infarction, Type II Diabetes Weeks Of Treatment: 6  Clustered Wound: Yes Photos Wound Measurements Length: (cm) 2.2 Width: (cm) 2 Depth: (cm) 0.1 Area: (cm) 3.456 Volume: (cm) 0.346 % Reduction in Area: 87.4% % Reduction in Volume: 87.4% Epithelialization: None Tunneling: No Undermining: No Wound Description Classification: Full Thickness Without Exposed Support  Structures Exudate Amount: Medium Exudate Type: Serosanguineous Exudate Color: red, brown Foul Odor After Cleansing: No Slough/Fibrino Yes Wound Bed Granulation Amount: Small (1-33%) Exposed Structure Granulation Quality: Red, Pink Fascia Exposed: No Necrotic Amount: Large (67-100%) Fat Layer (Subcutaneous Tissue) Exposed: Yes Necrotic Quality: Adherent Slough Tendon Exposed: No Muscle Exposed: No Joint Exposed: No Bone Exposed: No Treatment Notes Wound #6 (Foot) Wound Laterality: Left, Distal, Circumferential Cleanser Soap and Water Discharge Instruction: Gently cleanse wound with antibacterial soap, rinse and pat dry prior to dressing wounds Peri-Wound Care KHADEEM, ROCKETT ([id number]) Topical Primary Dressing Gauze Santyl Collagenase Ointment, 30 (gm), tube Secondary Dressing Secured With Compression Wrap Compression Stockings Add-Ons Electronic Signature(s) Signed: 10/29/2021 6:47:24 PM By: Carlene Coria RN Entered By: Carlene Coria on 10/29/2021 12:49:44 [name] ([id number]) -------------------------------------------------------------------------------- Vitals Details Patient Name: [name] Date of Service: 10/29/2021 12:30 PM Medical Record Number: [id number] Patient Account Number: [id number] Date of Birth/Sex: Dec 14, 1960 (61 y.o. M) Treating RN: Carlene Coria Primary Care Frederich Montilla: Karl Ito Other Clinician: Referring Clarkson Rosselli: Karl Ito Treating Brayant Dorr/Extender: Yaakov Guthrie in Treatment: 12 Vital Signs Time Taken: 12:43 Temperature (F): 97.7 Height (in): 65 Pulse (bpm): 85 Weight (lbs): 180 Respiratory Rate (breaths/min): 18 Body Mass Index (BMI): 30 Blood Pressure (mmHg): 139/76 Reference Range: 80 - 120 mg / dl Electronic Signature(s) Signed: 10/29/2021 6:47:24 PM By: Carlene Coria RN Entered By: Carlene Coria on 10/29/2021 12:44:35
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
## 2021-10-30 NOTE — Telephone Encounter (Signed)
CALLED PATIENT NO ANSWER LEFT VOICEMAIL FOR A CALL BACK ? ?

## 2021-11-03 ENCOUNTER — Telehealth: Payer: Self-pay

## 2021-11-03 NOTE — Telephone Encounter (Signed)
CALLED PATIENT NO ANSWER LEFT VOICEMAIL FOR A CALL BACK ? ?

## 2021-11-05 ENCOUNTER — Encounter (HOSPITAL_BASED_OUTPATIENT_CLINIC_OR_DEPARTMENT_OTHER): Payer: Medicare Other | Admitting: Internal Medicine

## 2021-11-05 ENCOUNTER — Other Ambulatory Visit: Payer: Self-pay

## 2021-11-05 DIAGNOSIS — E11622 Type 2 diabetes mellitus with other skin ulcer: Secondary | ICD-10-CM

## 2021-11-05 DIAGNOSIS — L97522 Non-pressure chronic ulcer of other part of left foot with fat layer exposed: Secondary | ICD-10-CM

## 2021-11-05 DIAGNOSIS — E11621 Type 2 diabetes mellitus with foot ulcer: Secondary | ICD-10-CM | POA: Diagnosis not present

## 2021-11-05 NOTE — Progress Notes (Signed)
[name], [name] ([id number]) Visit Report for 11/05/2021 Arrival Information Details Patient Name: [name], [name]. Date of Service: 11/05/2021 12:30 PM Medical Record Number: [id number] Patient Account Number: [id number] Date of Birth/Sex: [birth year] (61 y.o. M) Treating RN: [name] Primary Care Caralee Morea: [name] Other Clinician: Referring [name]: [name] Treating [name]/Extender: [name]: 13 Visit Information History Since Last Visit All ordered tests and consults were completed: No Patient Arrived: Ambulatory Added or deleted any medications: No Arrival Time: 12:36 Any new allergies or adverse reactions: No Accompanied By: self Had a fall or experienced change in No Transfer Assistance: None activities of daily living that may affect Patient Identification Verified: Yes risk of falls: Secondary Verification Process Completed: Yes Signs or symptoms of abuse/neglect since last visito No Patient Requires Transmission-Based Precautions: No Hospitalized since last visit: No Patient Has Alerts: No Implantable device outside of the clinic excluding No cellular tissue based products placed in the center since last visit: Has Dressing in Place as Prescribed: Yes Pain Present Now: No Electronic Signature(s) Signed: 11/05/2021 5:00:43 PM By: Derrick Coria RN Entered By: [name] on 11/05/2021 12:41:40 [name] ([id number]) -------------------------------------------------------------------------------- Clinic Level of Care Assessment Details Patient Name: [name], [name]. Date of Service: 11/05/2021 12:30 PM Medical Record Number: [id number] Patient Account Number: [id number] Date of Birth/Sex: Aug 11, 1961 (61 y.o. M) Treating RN: [name] Primary Care Chaelyn Bunyan: [name] Other Clinician: Referring [name]: [name] Treating [name]/Extender: [name]: 13 Clinic Level of Care Assessment  Items TOOL 4 Quantity Score X - Use when only an EandM is performed on FOLLOW-UP visit 1 0 ASSESSMENTS - Nursing Assessment / Reassessment X - Reassessment of Co-morbidities (includes updates in patient status) 1 10 X- 1 5 Reassessment of Adherence to [name] Plan ASSESSMENTS - Wound and Skin Assessment / Reassessment X - Simple Wound Assessment / Reassessment - one wound 1 5 '[]'  - 0 Complex Wound Assessment / Reassessment - multiple wounds '[]'  - 0 Dermatologic / Skin Assessment (not related to wound area) ASSESSMENTS - Focused Assessment '[]'  - Circumferential Edema Measurements - multi extremities 0 '[]'  - 0 Nutritional Assessment / Counseling / Intervention '[]'  - 0 Lower Extremity Assessment (monofilament, tuning fork, pulses) '[]'  - 0 Peripheral Arterial Disease Assessment (using hand held doppler) ASSESSMENTS - Ostomy and/or Continence Assessment and Care '[]'  - Incontinence Assessment and Management 0 '[]'  - 0 Ostomy Care Assessment and Management (repouching, etc.) PROCESS - Coordination of Care X - Simple Patient / Family Education for ongoing care 1 15 '[]'  - 0 Complex (extensive) Patient / Family Education for ongoing care '[]'  - 0 Staff obtains Programmer, systems, Records, Test Results / Process Orders '[]'  - 0 Staff telephones HHA, Nursing Homes / Clarify orders / etc '[]'  - 0 Routine Transfer to another Facility (non-emergent condition) '[]'  - 0 Routine Hospital Admission (non-emergent condition) '[]'  - 0 New Admissions / Biomedical engineer / Ordering NPWT, Apligraf, etc. '[]'  - 0 Emergency Hospital Admission (emergent condition) '[]'  - 0 Simple Discharge Coordination X- 1 15 Complex (extensive) Discharge Coordination PROCESS - Special Needs '[]'  - Pediatric / Minor Patient Management 0 '[]'  - 0 Isolation Patient Management '[]'  - 0 Hearing / Language / Visual special needs '[]'  - 0 Assessment of Community assistance (transportation, D/C planning, etc.) '[]'  - 0 Additional assistance /  Altered mentation '[]'  - 0 Support Surface(s) Assessment (bed, cushion, seat, etc.) INTERVENTIONS - Wound Cleansing / Measurement [name], [name] F. ([id number]) X- 1 5 Simple Wound Cleansing -  one wound '[]'  - 0 Complex Wound Cleansing - multiple wounds '[]'  - 0 Wound Imaging (photographs - any number of wounds) X- 1 5 Wound Tracing (instead of photographs) X- 1 5 Simple Wound Measurement - one wound '[]'  - 0 Complex Wound Measurement - multiple wounds INTERVENTIONS - Wound Dressings X - Small Wound Dressing one or multiple wounds 1 10 '[]'  - 0 Medium Wound Dressing one or multiple wounds '[]'  - 0 Large Wound Dressing one or multiple wounds '[]'  - 0 Application of Medications - topical '[]'  - 0 Application of Medications - injection INTERVENTIONS - Miscellaneous '[]'  - External ear exam 0 '[]'  - 0 Specimen Collection (cultures, biopsies, blood, body fluids, etc.) '[]'  - 0 Specimen(s) / Culture(s) sent or taken to Lab for analysis '[]'  - 0 Patient Transfer (multiple staff / Civil Service fast streamer / Similar devices) '[]'  - 0 Simple Staple / Suture removal (25 or less) '[]'  - 0 Complex Staple / Suture removal (26 or more) '[]'  - 0 Hypo / Hyperglycemic Management (close monitor of Blood Glucose) '[]'  - 0 Ankle / Brachial Index (ABI) - do not check if billed separately X- 1 5 Vital Signs Has the patient been seen at the hospital within the last three years: Yes Total Score: 80 Level Of Care: New/Established - Level 3 Electronic Signature(s) Signed: 11/05/2021 5:00:43 PM By: Derrick Coria RN Entered By: [name] on 11/05/2021 13:25:31 [name] ([id number]) -------------------------------------------------------------------------------- Encounter Discharge Information Details Patient Name: [name], [name]. Date of Service: 11/05/2021 12:30 PM Medical Record Number: [id number] Patient Account Number: [id number] Date of Birth/Sex: 1961/06/28 (61 y.o. M) Treating RN: [name] Primary Care [name]:  [name] Other Clinician: Referring [name]: [name] Treating [name]/Extender: [name]: 92 Encounter Discharge Information Items Post Procedure Vitals Discharge Condition: Stable Temperature (F): 98.1 Ambulatory Status: Ambulatory Pulse (bpm): 94 Discharge Destination: Home Respiratory Rate (breaths/min): 18 Transportation: Private Auto Blood Pressure (mmHg): 123/76 Accompanied By: self Schedule Follow-up Appointment: Yes Clinical Summary of Care: Patient Declined Electronic Signature(s) Signed: 11/05/2021 5:00:43 PM By: Derrick Coria RN Entered By: [name] on 11/05/2021 13:27:44 [name] ([id number]) -------------------------------------------------------------------------------- Lower Extremity Assessment Details Patient Name: [name], [name]. Date of Service: 11/05/2021 12:30 PM Medical Record Number: [id number] Patient Account Number: [id number] Date of Birth/Sex: 12/01/1960 (61 y.o. M) Treating RN: [name] Primary Care Ninamarie Keel: [name] Other Clinician: Referring Jaylina Ramdass: [name] Treating Briann Sarchet/Extender: [name]: 13 Vascular Assessment Pulses: Dorsalis Pedis Palpable: [Left:Yes] Electronic Signature(s) Signed: 11/05/2021 5:00:43 PM By: Derrick Coria RN Entered By: [name] on 11/05/2021 12:49:03 FISCHER, HALLEY ([id number]) -------------------------------------------------------------------------------- Multi Wound Chart Details Patient Name: [name], [name]. Date of Service: 11/05/2021 12:30 PM Medical Record Number: [id number] Patient Account Number: [id number] Date of Birth/Sex: 01-03-1961 (61 y.o. M) Treating RN: [name] Primary Care Hafsa Lohn: [name] Other Clinician: Referring Jasamine Pottinger: [name] Treating Irini Leet/Extender: [name]: 13 Vital Signs Height(in): 65 Pulse(bpm): 94 Weight(lbs): 180 Blood Pressure(mmHg):  123/76 Body Mass Index(BMI): 30 Temperature(F): 98.1 Respiratory Rate(breaths/min): 18 Photos: [N/A:N/A] Wound Location: Left, Distal, Circumferential Foot N/A N/A Wounding Event: Thermal Burn N/A N/A Primary Etiology: 2nd degree Burn N/A N/A Comorbid History: Coronary Artery Disease, N/A N/A Hypertension, Myocardial Infarction, Type II Diabetes Date Acquired: 09/14/2021 N/A N/A Weeks of [name]: 7 N/A N/A Wound Status: Open N/A N/A Clustered Wound: Yes N/A N/A Measurements L x W x D (cm) 1x1x0.1 N/A N/A Area (cm) : 0.785 N/A N/A Volume (cm) : 0.079  N/A N/A % Reduction in Area: 97.10% N/A N/A % Reduction in Volume: 97.10% N/A N/A Classification: Full Thickness Without Exposed N/A N/A Support Structures Exudate Amount: Medium N/A N/A Exudate Type: Serosanguineous N/A N/A Exudate Color: red, brown N/A N/A Granulation Amount: Small (1-33%) N/A N/A Granulation Quality: Red, Pink N/A N/A Necrotic Amount: Large (67-100%) N/A N/A Exposed Structures: Fat Layer (Subcutaneous Tissue): N/A N/A Yes Fascia: No Tendon: No Muscle: No Joint: No Bone: No Epithelialization: None N/A N/A [name] Notes Electronic Signature(s) Signed: 11/05/2021 5:00:43 PM By: Derrick Coria RN Entered By: [name] on 11/05/2021 13:17:18 [name] ([id number]) -------------------------------------------------------------------------------- Golden Details Patient Name: [name], [name]. Date of Service: 11/05/2021 12:30 PM Medical Record Number: [id number] Patient Account Number: [id number] Date of Birth/Sex: 04-10-1961 (61 y.o. M) Treating RN: [name] Primary Care Stela Iwasaki: [name] Other Clinician: Referring Samayra Hebel: [name] Treating Davontae Prusinski/Extender: [name]: 13 Active Inactive Wound/Skin Impairment Nursing Diagnoses: Knowledge deficit related to ulceration/compromised skin integrity Goals: Patient/caregiver will  verbalize understanding of skin care regimen Date Initiated: 08/06/2021 Date Inactivated: 08/13/2021 Target Resolution Date: 09/06/2021 Goal Status: Met Ulcer/skin breakdown will have a volume reduction of 30% by week 4 Date Initiated: 08/06/2021 Date Inactivated: 11/05/2021 Target Resolution Date: 10/06/2021 Goal Status: Unmet Unmet Reason: comorbites Ulcer/skin breakdown will have a volume reduction of 50% by week 8 Date Initiated: 08/06/2021 Target Resolution Date: 11/06/2021 Goal Status: Active Ulcer/skin breakdown will have a volume reduction of 80% by week 12 Date Initiated: 08/06/2021 Target Resolution Date: 12/07/2021 Goal Status: Active Ulcer/skin breakdown will heal within 14 weeks Date Initiated: 08/06/2021 Target Resolution Date: 01/04/2022 Goal Status: Active Interventions: Assess patient/caregiver ability to obtain necessary supplies Assess patient/caregiver ability to perform ulcer/skin care regimen upon admission and as needed Assess ulceration(s) every visit Notes: Electronic Signature(s) Signed: 11/05/2021 5:00:43 PM By: Derrick Coria RN Entered By: [name] on 11/05/2021 13:16:54 [name] ([id number]) -------------------------------------------------------------------------------- Pain Assessment Details Patient Name: [name], [name]. Date of Service: 11/05/2021 12:30 PM Medical Record Number: [id number] Patient Account Number: [id number] Date of Birth/Sex: 31-Jan-1961 (61 y.o. M) Treating RN: [name] Primary Care Kashawn Manzano: [name] Other Clinician: Referring Franceska Strahm: [name] Treating Verneal Wiers/Extender: [name]: 13 Active Problems Location of Pain Severity and Description of Pain Patient Has Paino No Site Locations Pain Management and Medication Current Pain Management: Electronic Signature(s) Signed: 11/05/2021 5:00:43 PM By: Derrick Coria RN Entered By: [name] on 11/05/2021 12:42:11 [name] ([id number]) -------------------------------------------------------------------------------- Patient/Caregiver Education Details Patient Name: [name], [name]. Date of Service: 11/05/2021 12:30 PM Medical Record Number: [id number] Patient Account Number: [id number] Date of Birth/Gender: 1961/09/26 (61 y.o. M) Treating RN: [name] Primary Care Physician: [name] Other Clinician: Referring Physician: Karl [name] Treating Physician/Extender: [name]: 13 Education Assessment Education Provided To: Patient Education Topics Provided Wound/Skin Impairment: Methods: Explain/Verbal Responses: State content correctly Electronic Signature(s) Signed: 11/05/2021 5:00:43 PM By: Derrick Coria RN Entered By: [name] on 11/05/2021 13:25:56 [name] ([id number]) -------------------------------------------------------------------------------- Wound Assessment Details Patient Name: [name], [name]. Date of Service: 11/05/2021 12:30 PM Medical Record Number: [id number] Patient Account Number: [id number] Date of Birth/Sex: [birth year] (61 y.o. M) Treating RN: [name] Primary Care Lillianah Swartzentruber: [name] Other Clinician: Referring Xochilth Standish: [name] Treating Izek Corvino/Extender: [name]: 13 Wound Status Wound Number: 6 Primary 2nd degree Burn Etiology: Wound Location: Left, Distal, Circumferential Foot Wound Status: Open Wounding Event: Thermal Burn Comorbid Coronary Artery Disease,  Hypertension, Myocardial Date Acquired: 09/14/2021 History: Infarction, Type II Diabetes Weeks Of [name]: 7 Clustered Wound: Yes Photos Wound Measurements Length: (cm) 1 Width: (cm) 1 Depth: (cm) 0.1 Area: (cm) 0.785 Volume: (cm) 0.079 % Reduction in Area: 97.1% % Reduction in Volume: 97.1% Epithelialization: None Tunneling: No Undermining: No Wound Description Classification: Full Thickness Without Exposed Support  Structures Exudate Amount: Medium Exudate Type: Serosanguineous Exudate Color: red, brown Foul Odor After Cleansing: No Slough/Fibrino Yes Wound Bed Granulation Amount: Small (1-33%) Exposed Structure Granulation Quality: Red, Pink Fascia Exposed: No Necrotic Amount: Large (67-100%) Fat Layer (Subcutaneous Tissue) Exposed: Yes Necrotic Quality: Adherent Slough Tendon Exposed: No Muscle Exposed: No Joint Exposed: No Bone Exposed: No [name] Notes Wound #6 (Foot) Wound Laterality: Left, Distal, Circumferential Cleanser Soap and Water Discharge Instruction: Gently cleanse wound with antibacterial soap, rinse and pat dry prior to dressing wounds [place]. ([id number]) Topical Primary Dressing Gauze Santyl Collagenase Ointment, 30 (gm), tube Secondary Dressing Secured With Compression Wrap Compression Stockings Add-Ons Electronic Signature(s) Signed: 11/05/2021 5:00:43 PM By: Derrick Coria RN Entered By: [name] on 11/05/2021 12:47:00 [name] ([id number]) -------------------------------------------------------------------------------- Vitals Details Patient Name: [name]. Date of Service: 11/05/2021 12:30 PM Medical Record Number: [id number] Patient Account Number: [id number] Date of Birth/Sex: 1961-07-05 (61 y.o. M) Treating RN: [name] Primary Care Tine Mabee: [name] Other Clinician: Referring Dontray Haberland: [name] Treating Demitri Kucinski/Extender: [name]: 13 Vital Signs Time Taken: 12:41 Temperature (F): 98.1 Height (in): 65 Pulse (bpm): 94 Weight (lbs): 180 Respiratory Rate (breaths/min): 18 Body Mass Index (BMI): 30 Blood Pressure (mmHg): 123/76 Reference Range: 80 - 120 mg / dl Electronic Signature(s) Signed: 11/05/2021 5:00:43 PM By: Derrick Coria RN Entered By: [name] on 11/05/2021 12:42:01
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
## 2021-11-06 ENCOUNTER — Other Ambulatory Visit: Payer: Commercial Managed Care - HMO

## 2021-11-06 DIAGNOSIS — R197 Diarrhea, unspecified: Secondary | ICD-10-CM

## 2021-11-10 ENCOUNTER — Telehealth: Payer: Self-pay

## 2021-11-10 NOTE — Telephone Encounter (Signed)
Patient has a lot of concerns wants to see provider first also gave daughter th e anesthesia phone number to help answer some of the questions

## 2021-11-11 LAB — GASTROINTESTINAL PATHOGEN PANEL PCR
C. difficile Tox A/B, PCR: NOT DETECTED
Campylobacter, PCR: NOT DETECTED
Cryptosporidium, PCR: NOT DETECTED
E coli (ETEC) LT/ST PCR: NOT DETECTED
E coli (STEC) stx1/stx2, PCR: NOT DETECTED
E coli 0157, PCR: NOT DETECTED
Giardia lamblia, PCR: NOT DETECTED
Norovirus, PCR: NOT DETECTED
Rotavirus A, PCR: NOT DETECTED
Salmonella, PCR: NOT DETECTED
Shigella, PCR: NOT DETECTED

## 2021-11-11 LAB — OVA AND PARASITE EXAMINATION
CONCENTRATE RESULT:: NONE SEEN
MICRO NUMBER:: 12892436
SPECIMEN QUALITY:: ADEQUATE
TRICHROME RESULT:: NONE SEEN

## 2021-11-12 ENCOUNTER — Encounter (HOSPITAL_BASED_OUTPATIENT_CLINIC_OR_DEPARTMENT_OTHER): Payer: Medicare Other | Admitting: Internal Medicine

## 2021-11-12 ENCOUNTER — Other Ambulatory Visit: Payer: Self-pay

## 2021-11-12 DIAGNOSIS — E11622 Type 2 diabetes mellitus with other skin ulcer: Secondary | ICD-10-CM | POA: Diagnosis not present

## 2021-11-12 DIAGNOSIS — L97522 Non-pressure chronic ulcer of other part of left foot with fat layer exposed: Secondary | ICD-10-CM | POA: Diagnosis not present

## 2021-11-12 DIAGNOSIS — T25222A Burn of second degree of left foot, initial encounter: Secondary | ICD-10-CM

## 2021-11-12 DIAGNOSIS — E11621 Type 2 diabetes mellitus with foot ulcer: Secondary | ICD-10-CM | POA: Diagnosis not present
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
## 2021-11-12 NOTE — Progress Notes (Signed)
VASHON, RIORDAN ([id number]) Visit Report for 11/12/2021 Arrival Information Details Patient Name: [name], [name]. Date of Service: 11/12/2021 12:30 PM Medical Record Number: [id number] Patient Account Number: [id number] Date of Birth/Sex: 1961-07-21 (61 y.o. M) Treating RN: Carlene Coria Primary Care Laneka Mcgrory: Karl Ito Other Clinician: Referring Giancarlos Berendt: Karl Ito Treating Raneem Mendolia/Extender: Yaakov Guthrie in Treatment: 14 Visit Information History Since Last Visit All ordered tests and consults were completed: No Patient Arrived: Ambulatory Added or deleted any medications: No Arrival Time: 12:36 Any new allergies or adverse reactions: No Accompanied By: daughter Had a fall or experienced change in No Transfer Assistance: None activities of daily living that may affect Patient Identification Verified: Yes risk of falls: Secondary Verification Process Completed: Yes Signs or symptoms of abuse/neglect since last visito No Patient Requires Transmission-Based Precautions: No Hospitalized since last visit: No Patient Has Alerts: No Implantable device outside of the clinic excluding No cellular tissue based products placed in the center since last visit: Has Dressing in Place as Prescribed: Yes Pain Present Now: No Electronic Signature(s) Signed: 11/12/2021 3:33:48 PM By: Carlene Coria RN Entered By: Carlene Coria on 11/12/2021 12:39:47 [name] ([id number]) -------------------------------------------------------------------------------- Clinic Level of Care Assessment Details Patient Name: [name]. Date of Service: 11/12/2021 12:30 PM Medical Record Number: [id number] Patient Account Number: [id number] Date of Birth/Sex: August 30, 1961 (61 y.o. M) Treating RN: Carlene Coria Primary Care Kaiel Weide: Karl Ito Other Clinician: Referring Dong Nimmons: Karl Ito Treating Yarel Rushlow/Extender: Yaakov Guthrie in Treatment: 14 Clinic Level of Care  Assessment Items TOOL 4 Quantity Score _0  - Use when only an EandM is performed on FOLLOW-UP visit 0 ASSESSMENTS - Nursing Assessment / Reassessment _1  - Reassessment of Co-morbidities (includes updates in patient status) 0 _2  - 0 Reassessment of Adherence to Treatment Plan ASSESSMENTS - Wound and Skin Assessment / Reassessment _3  - Simple Wound Assessment / Reassessment - one wound 0 _4  - 0 Complex Wound Assessment / Reassessment - multiple wounds _5  - 0 Dermatologic / Skin Assessment (not related to wound area) ASSESSMENTS - Focused Assessment _6  - Circumferential Edema Measurements - multi extremities 0 _7  - 0 Nutritional Assessment / Counseling / Intervention _8  - 0 Lower Extremity Assessment (monofilament, tuning fork, pulses) _9  - 0 Peripheral Arterial Disease Assessment (using hand held doppler) ASSESSMENTS - Ostomy and/or Continence Assessment and Care _10  - Incontinence Assessment and Management 0 _11  - 0 Ostomy Care Assessment and Management (repouching, etc.) PROCESS - Coordination of Care _12  - Simple Patient / Family Education for ongoing care 0 _13  - 0 Complex (extensive) Patient / Family Education for ongoing care _14  - 0 Staff obtains Programmer, systems, Records, Test Results / Process Orders _15  - 0 Staff telephones HHA, Nursing Homes / Clarify orders / etc _16  - 0 Routine Transfer to another Facility (non-emergent condition) _17  - 0 Routine Hospital Admission (non-emergent condition) _18  - 0 New Admissions / Biomedical engineer / Ordering NPWT, Apligraf, etc. _19  - 0 Emergency Hospital Admission (emergent condition) _20  - 0 Simple Discharge Coordination _21  - 0 Complex (extensive) Discharge Coordination PROCESS - Special Needs _22  - Pediatric / Minor Patient Management 0 _23  - 0 Isolation Patient Management _24  - 0 Hearing / Language / Visual special needs _25  - 0 Assessment of Community assistance (transportation, D/C planning, etc.) _26  - 0 Additional assistance /  Altered mentation _27  - 0 Support Surface(s) Assessment (bed, cushion, seat, etc.) INTERVENTIONS - Wound Cleansing / Measurement KIRSTEN, MCKONE F. ([id number]) _28  - 0 Simple Wound Cleansing - one wound _29  -  0 Complex Wound Cleansing - multiple wounds _0  - 0 Wound Imaging (photographs - any number of wounds) _1  - 0 Wound Tracing (instead of photographs) _2  - 0 Simple Wound Measurement - one wound _3  - 0 Complex Wound Measurement - multiple wounds INTERVENTIONS - Wound Dressings _4  - Small Wound Dressing one or multiple wounds 0 _5  - 0 Medium Wound Dressing one or multiple wounds _6  - 0 Large Wound Dressing one or multiple wounds <VOZDGUYQIHKVQQVZ>_5<\/GLOVFIEPPIRJJOAC>_1  - 0 Application of Medications - topical <YSAYTKZSWFUXNATF>_5<\/DDUKGURKYHCWCBJS>_2  - 0 Application of Medications - injection INTERVENTIONS - Miscellaneous _9  - External ear exam 0 _10  - 0 Specimen Collection (cultures, biopsies, blood, body fluids, etc.) _11  - 0 Specimen(s) / Culture(s) sent or taken to Lab for analysis _12  - 0 Patient Transfer (multiple staff / Civil Service fast streamer / Similar devices) _13  - 0 Simple Staple / Suture removal (25 or less) _14  - 0 Complex Staple / Suture removal (26 or more) _15  - 0 Hypo / Hyperglycemic Management (close monitor of Blood Glucose) _16  - 0 Ankle / Brachial Index (ABI) - do not check if billed separately _17  - 0 Vital Signs Has the patient been seen at the hospital within the last three years: Yes Total Score: 0 Level Of Care: ____ Electronic Signature(s) Signed: 11/12/2021 3:33:48 PM By: Carlene Coria RN Entered By: Carlene Coria on 11/12/2021 12:58:20 [name] ([id number]) -------------------------------------------------------------------------------- Encounter Discharge Information Details Patient Name: ASHVIK, GRUNDMAN. Date of Service: 11/12/2021 12:30 PM Medical Record Number: [id number] Patient Account Number: [id number] Date of Birth/Sex: Sep 20, 1961 (61 y.o. M) Treating RN: Carlene Coria Primary Care Lenis Nettleton: Karl Ito Other  Clinician: Referring Kwynn Schlotter: Karl Ito Treating Monisha Siebel/Extender: Yaakov Guthrie in Treatment: 14 Encounter Discharge Information Items Post Procedure Vitals Discharge Condition: Stable Temperature (F): 98.1 Ambulatory Status: Ambulatory Pulse (bpm): 80 Discharge Destination: Home Respiratory Rate (breaths/min): 18 Transportation: Private Auto Blood Pressure (mmHg): 103/69 Accompanied By: self Schedule Follow-up Appointment: Yes Clinical Summary of Care: Patient Declined Electronic Signature(s) Signed: 11/12/2021 3:33:48 PM By: Carlene Coria RN Entered By: Carlene Coria on 11/12/2021 13:00:31 [name] ([id number]) -------------------------------------------------------------------------------- Lower Extremity Assessment Details Patient Name: JERIMY, JOHANSON. Date of Service: 11/12/2021 12:30 PM Medical Record Number: [id number] Patient Account Number: [id number] Date of Birth/Sex: [birth year] (61 y.o. M) Treating RN: Carlene Coria Primary Care Derika Eckles: Karl Ito Other Clinician: Referring Regan Llorente: Karl Ito Treating Xavior Niazi/Extender: Yaakov Guthrie in Treatment: 14 Vascular Assessment Pulses: Dorsalis Pedis Palpable: [Left:Yes] Electronic Signature(s) Signed: 11/12/2021 3:33:48 PM By: Carlene Coria RN Entered By: Carlene Coria on 11/12/2021 12:46:10 [name] ([id number]) -------------------------------------------------------------------------------- Multi Wound Chart Details Patient Name: PHINEAS, MCENROE. Date of Service: 11/12/2021 12:30 PM Medical Record Number: [id number] Patient Account Number: [id number] Date of Birth/Sex: 1961/07/24 (61 y.o. M) Treating RN: Carlene Coria Primary Care Zamya Culhane: Karl Ito Other Clinician: Referring Zaliyah Meikle: Karl Ito Treating Derl Abalos/Extender: Yaakov Guthrie in Treatment: 14 Vital Signs Height(in): 65 Pulse(bpm): 80 Weight(lbs): 180 Blood Pressure(mmHg): 103/69 Body Mass  Index(BMI): 30 Temperature(F): 98.1 Respiratory Rate(breaths/min): 18 Photos: [N/A:N/A] Wound Location: Left, Distal, Circumferential Foot N/A N/A Wounding Event: Thermal Burn N/A N/A Primary Etiology: 2nd degree Burn N/A N/A Comorbid History: Coronary Artery Disease, N/A N/A Hypertension, Myocardial Infarction, Type II Diabetes Date Acquired: 09/14/2021 N/A N/A Weeks of Treatment: 8 N/A N/A Wound Status: Open N/A N/A Wound Recurrence: No N/A N/A Clustered Wound: Yes N/A N/A Measurements L x W x D (cm) 0.5x0.3x0.1 N/A N/A Area (cm) : 0.118 N/A N/A Volume (cm) : 0.012 N/A N/A %  Reduction in Area: 99.60% N/A N/A % Reduction in Volume: 99.60% N/A N/A Classification: Full Thickness Without Exposed N/A N/A Support Structures Exudate Amount: Medium N/A N/A Exudate Type: Serosanguineous N/A N/A Exudate Color: red, brown N/A N/A Granulation Amount: Small (1-33%) N/A N/A Granulation Quality: Red, Pink N/A N/A Necrotic Amount: Large (67-100%) N/A N/A Exposed Structures: Fat Layer (Subcutaneous Tissue): N/A N/A Yes Fascia: No Tendon: No Muscle: No Joint: No Bone: No Epithelialization: None N/A N/A Treatment Notes Electronic Signature(s) Signed: 11/12/2021 3:33:48 PM By: Carlene Coria RN Entered By: Carlene Coria on 11/12/2021 12:49:24 MYKALE, GANDOLFO ([id number], JOWETT ([id number]) -------------------------------------------------------------------------------- Multi-Disciplinary Care Plan Details Patient Name: DMARIO, RUSSOM. Date of Service: 11/12/2021 12:30 PM Medical Record Number: [id number] Patient Account Number: [id number] Date of Birth/Sex: May 15, 1961 (61 y.o. M) Treating RN: Carlene Coria Primary Care Dontrel Smethers: Karl Ito Other Clinician: Referring Khaliq Turay: Karl Ito Treating Reganne Messerschmidt/Extender: Yaakov Guthrie in Treatment: 14 Active Inactive Wound/Skin Impairment Nursing Diagnoses: Knowledge deficit related to ulceration/compromised skin  integrity Goals: Patient/caregiver will verbalize understanding of skin care regimen Date Initiated: 08/06/2021 Date Inactivated: 08/13/2021 Target Resolution Date: 09/06/2021 Goal Status: Met Ulcer/skin breakdown will have a volume reduction of 30% by week 4 Date Initiated: 08/06/2021 Date Inactivated: 11/05/2021 Target Resolution Date: 10/06/2021 Goal Status: Unmet Unmet Reason: comorbites Ulcer/skin breakdown will have a volume reduction of 50% by week 8 Date Initiated: 08/06/2021 Target Resolution Date: 11/06/2021 Goal Status: Active Ulcer/skin breakdown will have a volume reduction of 80% by week 12 Date Initiated: 08/06/2021 Target Resolution Date: 12/07/2021 Goal Status: Active Ulcer/skin breakdown will heal within 14 weeks Date Initiated: 08/06/2021 Target Resolution Date: 01/04/2022 Goal Status: Active Interventions: Assess patient/caregiver ability to obtain necessary supplies Assess patient/caregiver ability to perform ulcer/skin care regimen upon admission and as needed Assess ulceration(s) every visit Notes: Electronic Signature(s) Signed: 11/12/2021 3:33:48 PM By: Carlene Coria RN Entered By: Carlene Coria on 11/12/2021 12:48:18 [name] ([id number]) -------------------------------------------------------------------------------- Pain Assessment Details Patient Name: ANYELO, MCCUE. Date of Service: 11/12/2021 12:30 PM Medical Record Number: [id number] Patient Account Number: [id number] Date of Birth/Sex: [birth year] (61 y.o. M) Treating RN: Carlene Coria Primary Care Rhemi Balbach: Karl Ito Other Clinician: Referring Shabrea Weldin: Karl Ito Treating Liora Myles/Extender: Yaakov Guthrie in Treatment: 14 Active Problems Location of Pain Severity and Description of Pain Patient Has Paino No Site Locations Pain Management and Medication Current Pain Management: Electronic Signature(s) Signed: 11/12/2021 3:33:48 PM By: Carlene Coria RN Entered By: Carlene Coria on 11/12/2021 12:40:33 [name] ([id number]) -------------------------------------------------------------------------------- Patient/Caregiver Education Details Patient Name: CATALINO, PLASCENCIA. Date of Service: 11/12/2021 12:30 PM Medical Record Number: [id number] Patient Account Number: [id number] Date of Birth/Gender: [birth year] (61 y.o. M) Treating RN: Carlene Coria Primary Care Physician: Karl Ito Other Clinician: Referring Physician: Karl Ito Treating Physician/Extender: Yaakov Guthrie in Treatment: 14 Education Assessment Education Provided To: Patient Education Topics Provided Wound/Skin Impairment: Methods: Explain/Verbal Responses: State content correctly Electronic Signature(s) Signed: 11/12/2021 3:33:48 PM By: Carlene Coria RN Entered By: Carlene Coria on 11/12/2021 12:59:36 PARIS, CHIRIBOGA ([id number]) -------------------------------------------------------------------------------- Wound Assessment Details Patient Name: FLEET, HIGHAM. Date of Service: 11/12/2021 12:30 PM Medical Record Number: [id number] Patient Account Number: [id number] Date of Birth/Sex: 07-13-1961 (61 y.o. M) Treating RN: Carlene Coria Primary Care Lenix Benoist: Karl Ito Other Clinician: Referring Sakari Raisanen: Karl Ito Treating Kameron Blethen/Extender: Yaakov Guthrie in Treatment: 14 Wound Status Wound Number: 6 Primary 2nd degree Burn Etiology: Wound Location: Left, Distal, Circumferential Foot Wound Status: Open Wounding Event: Thermal Burn Comorbid Coronary Artery  Disease, Hypertension, Myocardial Date Acquired: 09/14/2021 History: Infarction, Type II Diabetes Weeks Of Treatment: 8 Clustered Wound: Yes Photos Wound Measurements Length: (cm) 0.5 Width: (cm) 0.3 Depth: (cm) 0.1 Area: (cm) 0.118 Volume: (cm) 0.012 % Reduction in Area: 99.6% % Reduction in Volume: 99.6% Epithelialization: None Tunneling: No Undermining: No Wound  Description Classification: Full Thickness Without Exposed Support Structures Exudate Amount: Medium Exudate Type: Serosanguineous Exudate Color: red, brown Foul Odor After Cleansing: No Slough/Fibrino Yes Wound Bed Granulation Amount: Small (1-33%) Exposed Structure Granulation Quality: Red, Pink Fascia Exposed: No Necrotic Amount: Large (67-100%) Fat Layer (Subcutaneous Tissue) Exposed: Yes Necrotic Quality: Adherent Slough Tendon Exposed: No Muscle Exposed: No Joint Exposed: No Bone Exposed: No Treatment Notes Wound #6 (Foot) Wound Laterality: Left, Distal, Circumferential Cleanser Soap and Water Discharge Instruction: Gently cleanse wound with antibacterial soap, rinse and pat dry prior to dressing wounds [place]. ([id number]) Topical Primary Dressing Gauze Santyl Collagenase Ointment, 30 (gm), tube Secondary Dressing Secured With Compression Wrap Compression Stockings Add-Ons Electronic Signature(s) Signed: 11/12/2021 3:33:48 PM By: Carlene Coria RN Entered By: Carlene Coria on 11/12/2021 12:45:34 [name] ([id number]) -------------------------------------------------------------------------------- Vitals Details Patient Name: [name] Date of Service: 11/12/2021 12:30 PM Medical Record Number: [id number] Patient Account Number: [id number] Date of Birth/Sex: [birth year] (61 y.o. M) Treating RN: Carlene Coria Primary Care Dvon Jiles: Karl Ito Other Clinician: Referring Rozlynn Lippold: Karl Ito Treating Carlee Tesfaye/Extender: Yaakov Guthrie in Treatment: 14 Vital Signs Time Taken: 12:39 Temperature (F): 98.1 Height (in): 65 Pulse (bpm): 80 Weight (lbs): 180 Respiratory Rate (breaths/min): 18 Body Mass Index (BMI): 30 Blood Pressure (mmHg): 103/69 Reference Range: 80 - 120 mg / dl Electronic Signature(s) Signed: 11/12/2021 3:33:48 PM By: Carlene Coria RN Entered By: Carlene Coria on 11/12/2021 12:40:22

## 2021-11-13 ENCOUNTER — Other Ambulatory Visit: Payer: Self-pay

## 2021-11-13 DIAGNOSIS — E7800 Pure hypercholesterolemia, unspecified: Secondary | ICD-10-CM

## 2021-11-13 NOTE — Telephone Encounter (Signed)
Left detailed message for patient on voicemail-ok per DPR on file.  Refill requests received from [facility] stating patient wanted to use this pharmacy for refills of Atorvastatin and Gabapentin. Need to confirm this with patient.  Also, Gabapentin was refilled on 10/29/21 #60 with 2 refills and sent to [facility]. Did patient pick this up? Is he going to use the refills on file with them for now or switch over to [facility].  FYI to [facility] in case a request bypasses me and goes to [place].

## 2021-11-17 ENCOUNTER — Other Ambulatory Visit: Payer: Self-pay | Admitting: Nurse Practitioner

## 2021-11-17 DIAGNOSIS — E7800 Pure hypercholesterolemia, unspecified: Secondary | ICD-10-CM

## 2021-11-17 MED ORDER — ATORVASTATIN CALCIUM 80 MG PO TABS: 80.0000 mg | ORAL_TABLET | Freq: Every day | ORAL | 11 refills | Status: AC

## 2021-11-17 NOTE — Telephone Encounter (Signed)
Spoke with patient. He is wanting to try [place], this is something that is offered through his Medicare insurance. He did pick up his 30 day supply of Gabapentin on 10/29/21. Advised patient I will call [facility] and cancel refills in their system. Pharmacy is closed right now so I will call back when they open to cancel refills on their end.

## 2021-11-17 NOTE — Telephone Encounter (Addendum)
Spoke to pharmacist and was told that Gabapentin was transferred out, stated most likely [facility] called and got this transferred to them. Atorvastatin was not on file with [facility]. I called [facility] and spoke with Joann-pharmacist. They did call and got Gabapentin transferred over to them, on file right now they have quantity of 180 supply for now. They do not have Atorvastatin and that would need to be refilled. I asked if they supple 90 day or 30 day for medications and was told its usually 30 day because they do pill packs for patients.

## 2021-11-19 ENCOUNTER — Encounter: Payer: Medicare Other | Admitting: Internal Medicine

## 2021-11-20 ENCOUNTER — Other Ambulatory Visit: Payer: Self-pay | Admitting: Nurse Practitioner

## 2021-11-20 NOTE — Telephone Encounter (Signed)
Left message for patient to call back. This request is from [facility]. On 11/17/21 I spoke with patient in regards to [facility] that was requesting refills. Patient stated he was using them. Need to know if patient confused the 2 pharmacies or what is he wanting to use.

## 2021-11-20 NOTE — Telephone Encounter (Signed)
°  Encourage patient to contact the pharmacy for refills or they can request refills through [facility]:  Please schedule appointment if longer than 1 year  NEXT APPOINTMENT DATE:  MEDICATION:gabapentin (NEURONTIN) 300 MG capsule  Is the patient out of medication?   [name]  Let patient know to contact pharmacy at the end of the day to make sure medication is ready.  Please notify patient to allow 48-72 hours to process  CLINICAL FILLS OUT ALL BELOW:   LAST REFILL:  QTY:  REFILL DATE:    OTHER COMMENTS:    Okay for refill?  Please advise

## 2021-11-21 ENCOUNTER — Telehealth: Payer: Self-pay | Admitting: Nurse Practitioner

## 2021-11-21 ENCOUNTER — Other Ambulatory Visit: Payer: Self-pay | Admitting: Nurse Practitioner

## 2021-11-21 DIAGNOSIS — R110 Nausea: Secondary | ICD-10-CM

## 2021-11-21 MED ORDER — GABAPENTIN 300 MG PO CAPS: ORAL_CAPSULE | ORAL | 2 refills | Status: DC

## 2021-11-21 MED ORDER — PROMETHAZINE HCL 12.5 MG PO TABS: 12.5000 mg | ORAL_TABLET | Freq: Three times a day (TID) | ORAL | 0 refills | Status: DC | PRN

## 2021-11-21 NOTE — Telephone Encounter (Signed)
Spoke with patient and his daughter, [name], was advised that they do not want to use [facility]-not sure where the misunderstanding came from but would like to use [facility]. I called [facility] and cancelled Gabapentin and Atorvastatin that was on file with them. Please review refill request.   Patient also states that he has been having nausea ever since been placed on Ozempic about 6 - 8 weeks by his endocrinologist. Patient has not been able to get in touch with that provider and wanted to see if [facility] would be able to send him some nausea medicine. He has an appointment with endo to follow up on 11/25/21. Patient does get diarrhea and vomiting but he also has this due to having Gastroparesis but since been on Ozempic has more frequent episodes of vomiting.

## 2021-11-21 NOTE — Telephone Encounter (Signed)
See other phone note in regards to the nausea medication

## 2021-11-21 NOTE — Addendum Note (Signed)
Addended by: Kris Mouton on: 11/21/2021 03:03 PM   Modules accepted: Orders

## 2021-11-21 NOTE — Telephone Encounter (Signed)
We did discuss some about his nausea in office. He was placed on metoclopramide to help with the gastro paresis and nausea. I see it says he is not taking it. Is that because he has no more or what is happening there. If he has it it can help with the feeling of nausea and hopefully perk his bowels up some

## 2021-11-21 NOTE — Telephone Encounter (Signed)
Called patient but not able to reach him. Called [name] back and advised of Matt's feedback. Per [name] patient only received 5 tablets of Reglan and has been out of this, patient was advised by the provider that gave that medication that they did not want patient to be on this medication often/too much due to side effects. Patient told Wende Crease that he thought Reglan helped for first 2 days he took it but not so much after. Patient wanted to just try something just for nausea and to get him through until his endocrinologist appointment on 11/25/21 and he will discuss his Ozempic with them then. Matt advised of this verbally and stated he will send medication in for the patient, Wende Crease advised and would like to use [employer] for this

## 2021-11-22 DIAGNOSIS — G4733 Obstructive sleep apnea (adult) (pediatric): Secondary | ICD-10-CM | POA: Diagnosis not present

## 2021-11-24 DIAGNOSIS — H26493 Other secondary cataract, bilateral: Secondary | ICD-10-CM | POA: Diagnosis not present

## 2021-11-25 DIAGNOSIS — E1165 Type 2 diabetes mellitus with hyperglycemia: Secondary | ICD-10-CM | POA: Diagnosis not present

## 2021-11-25 DIAGNOSIS — Z794 Long term (current) use of insulin: Secondary | ICD-10-CM | POA: Diagnosis not present

## 2021-11-26 ENCOUNTER — Encounter: Payer: Medicare Other | Attending: Internal Medicine | Admitting: Internal Medicine

## 2021-11-26 ENCOUNTER — Other Ambulatory Visit: Payer: Self-pay

## 2021-11-26 DIAGNOSIS — I25119 Atherosclerotic heart disease of native coronary artery with unspecified angina pectoris: Secondary | ICD-10-CM | POA: Diagnosis not present

## 2021-11-26 DIAGNOSIS — Z951 Presence of aortocoronary bypass graft: Secondary | ICD-10-CM | POA: Insufficient documentation

## 2021-11-26 DIAGNOSIS — L97522 Non-pressure chronic ulcer of other part of left foot with fat layer exposed: Secondary | ICD-10-CM | POA: Diagnosis not present

## 2021-11-26 DIAGNOSIS — Z794 Long term (current) use of insulin: Secondary | ICD-10-CM | POA: Insufficient documentation

## 2021-11-26 DIAGNOSIS — X19XXXA Contact with other heat and hot substances, initial encounter: Secondary | ICD-10-CM | POA: Insufficient documentation

## 2021-11-26 DIAGNOSIS — E11621 Type 2 diabetes mellitus with foot ulcer: Secondary | ICD-10-CM | POA: Insufficient documentation

## 2021-11-26 DIAGNOSIS — I110 Hypertensive heart disease with heart failure: Secondary | ICD-10-CM | POA: Insufficient documentation

## 2021-11-26 DIAGNOSIS — T25222A Burn of second degree of left foot, initial encounter: Secondary | ICD-10-CM | POA: Diagnosis not present

## 2021-11-26 DIAGNOSIS — Z89422 Acquired absence of other left toe(s): Secondary | ICD-10-CM | POA: Insufficient documentation

## 2021-11-26 DIAGNOSIS — I5022 Chronic systolic (congestive) heart failure: Secondary | ICD-10-CM | POA: Insufficient documentation

## 2021-11-26 DIAGNOSIS — F17210 Nicotine dependence, cigarettes, uncomplicated: Secondary | ICD-10-CM | POA: Diagnosis not present
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
## 2021-11-27 ENCOUNTER — Encounter: Payer: Self-pay | Admitting: Sports Medicine

## 2021-11-27 ENCOUNTER — Ambulatory Visit (INDEPENDENT_AMBULATORY_CARE_PROVIDER_SITE_OTHER): Payer: Medicare Other | Admitting: Sports Medicine

## 2021-11-27 DIAGNOSIS — B351 Tinea unguium: Secondary | ICD-10-CM

## 2021-11-27 DIAGNOSIS — Z89422 Acquired absence of other left toe(s): Secondary | ICD-10-CM

## 2021-11-27 DIAGNOSIS — M79676 Pain in unspecified toe(s): Secondary | ICD-10-CM | POA: Diagnosis not present

## 2021-11-27 DIAGNOSIS — L84 Corns and callosities: Secondary | ICD-10-CM

## 2021-11-27 DIAGNOSIS — M79609 Pain in unspecified limb: Secondary | ICD-10-CM | POA: Diagnosis not present

## 2021-11-27 DIAGNOSIS — E1142 Type 2 diabetes mellitus with diabetic polyneuropathy: Secondary | ICD-10-CM

## 2021-11-27 NOTE — Progress Notes (Signed)
[name], [name] ([id number]) Visit Report for 11/26/2021 Arrival Information Details Patient Name: [name], [name]. Date of Service: 11/26/2021 12:30 PM Medical Record Number: [id number] Patient Account Number: [id number] Date of Birth/Sex: 1961/09/20 (61 y.o. M) Treating RN: Carlene Coria Primary Care Mahitha Hickling: Karl Ito Other Clinician: Referring Jaquanna Ballentine: Karl Ito Treating Dartanyon Frankowski/Extender: Yaakov Guthrie in Treatment: 16 Visit Information History Since Last Visit All ordered tests and consults were completed: No Patient Arrived: Ambulatory Added or deleted any medications: No Arrival Time: 12:38 Any new allergies or adverse reactions: No Accompanied By: self Had a fall or experienced change in No Transfer Assistance: None activities of daily living that may affect Patient Identification Verified: Yes risk of falls: Secondary Verification Process Completed: Yes Signs or symptoms of abuse/neglect since last visito No Patient Requires Transmission-Based Precautions: No Hospitalized since last visit: No Patient Has Alerts: No Implantable device outside of the clinic excluding No cellular tissue based products placed in the center since last visit: Has Dressing in Place as Prescribed: Yes Pain Present Now: No Electronic Signature(s) Signed: 11/27/2021 8:26:07 AM By: Carlene Coria RN Entered By: Carlene Coria on 11/26/2021 12:46:50 [name] ([id number]) -------------------------------------------------------------------------------- Clinic Level of Care Assessment Details Patient Name: [name], [name]. Date of Service: 11/26/2021 12:30 PM Medical Record Number: [id number] Patient Account Number: [id number] Date of Birth/Sex: [birth year] (61 y.o. M) Treating RN: Carlene Coria Primary Care Hassel Uphoff: Karl Ito Other Clinician: Referring Valborg Friar: Karl Ito Treating Reneisha Stilley/Extender: Yaakov Guthrie in Treatment: 16 Clinic Level of Care Assessment  Items TOOL 1 Quantity Score '[]'  - Use when EandM and Procedure is performed on INITIAL visit 0 ASSESSMENTS - Nursing Assessment / Reassessment '[]'  - General Physical Exam (combine w/ comprehensive assessment (listed just below) when performed on new 0 pt. evals) '[]'  - 0 Comprehensive Assessment (HX, ROS, Risk Assessments, Wounds Hx, etc.) ASSESSMENTS - Wound and Skin Assessment / Reassessment '[]'  - Dermatologic / Skin Assessment (not related to wound area) 0 ASSESSMENTS - Ostomy and/or Continence Assessment and Care '[]'  - Incontinence Assessment and Management 0 '[]'  - 0 Ostomy Care Assessment and Management (repouching, etc.) PROCESS - Coordination of Care '[]'  - Simple Patient / Family Education for ongoing care 0 '[]'  - 0 Complex (extensive) Patient / Family Education for ongoing care '[]'  - 0 Staff obtains Programmer, systems, Records, Test Results / Process Orders '[]'  - 0 Staff telephones HHA, Nursing Homes / Clarify orders / etc '[]'  - 0 Routine Transfer to another Facility (non-emergent condition) '[]'  - 0 Routine Hospital Admission (non-emergent condition) '[]'  - 0 New Admissions / Biomedical engineer / Ordering NPWT, Apligraf, etc. '[]'  - 0 Emergency Hospital Admission (emergent condition) PROCESS - Special Needs '[]'  - Pediatric / Minor Patient Management 0 '[]'  - 0 Isolation Patient Management '[]'  - 0 Hearing / Language / Visual special needs '[]'  - 0 Assessment of Community assistance (transportation, D/C planning, etc.) '[]'  - 0 Additional assistance / Altered mentation '[]'  - 0 Support Surface(s) Assessment (bed, cushion, seat, etc.) INTERVENTIONS - Miscellaneous '[]'  - External ear exam 0 '[]'  - 0 Patient Transfer (multiple staff / Civil Service fast streamer / Similar devices) '[]'  - 0 Simple Staple / Suture removal (25 or less) '[]'  - 0 Complex Staple / Suture removal (26 or more) '[]'  - 0 Hypo/Hyperglycemic Management (do not check if billed separately) '[]'  - 0 Ankle / Brachial Index (ABI) - do not check if  billed separately Has the patient been seen at the hospital within the last three years: Yes Total Score: 0 Level  Of Care: ____ [name] ([id number]) Electronic Signature(s) Signed: 11/27/2021 8:26:07 AM By: Carlene Coria RN Entered By: Carlene Coria on 11/26/2021 13:13:55 [name] ([id number]) -------------------------------------------------------------------------------- Encounter Discharge Information Details Patient Name: [name], [name]. Date of Service: 11/26/2021 12:30 PM Medical Record Number: [id number] Patient Account Number: [id number] Date of Birth/Sex: 12-30-1960 (61 y.o. M) Treating RN: Carlene Coria Primary Care Laurisa Sahakian: Karl Ito Other Clinician: Referring Westley Blass: Karl Ito Treating Ramah Langhans/Extender: Yaakov Guthrie in Treatment: 16 Encounter Discharge Information Items Discharge Condition: Stable Ambulatory Status: Ambulatory Discharge Destination: Home Transportation: Private Auto Accompanied By: daughter Schedule Follow-up Appointment: Yes Clinical Summary of Care: Patient Declined Electronic Signature(s) Signed: 11/26/2021 1:15:03 PM By: Carlene Coria RN Entered By: Carlene Coria on 11/26/2021 13:15:03 [name] ([id number]) -------------------------------------------------------------------------------- Lower Extremity Assessment Details Patient Name: [name], [name]. Date of Service: 11/26/2021 12:30 PM Medical Record Number: [id number] Patient Account Number: [id number] Date of Birth/Sex: 10/20/1960 (61 y.o. M) Treating RN: Carlene Coria Primary Care Valicia Rief: Karl Ito Other Clinician: Referring Gissel Keilman: Karl Ito Treating Burch Marchuk/Extender: Yaakov Guthrie in Treatment: 16 Vascular Assessment Pulses: Dorsalis Pedis Palpable: [Left:Yes] Electronic Signature(s) Signed: 11/27/2021 8:26:07 AM By: Carlene Coria RN Entered By: Carlene Coria on 11/26/2021 12:48:30 [name], HYPPOLITE  ([id number]) -------------------------------------------------------------------------------- Multi Wound Chart Details Patient Name: NAVI, ERBER. Date of Service: 11/26/2021 12:30 PM Medical Record Number: [id number] Patient Account Number: [id number] Date of Birth/Sex: 08-Oct-1961 (61 y.o. M) Treating RN: Carlene Coria Primary Care Demauri Advincula: Karl Ito Other Clinician: Referring Angelik Walls: Karl Ito Treating Stacey Sago/Extender: Yaakov Guthrie in Treatment: 16 Vital Signs Height(in): 65 Pulse(bpm): 80 Weight(lbs): 180 Blood Pressure(mmHg): 132/79 Body Mass Index(BMI): 30 Temperature(F): 97.6 Respiratory Rate(breaths/min): 18 Photos: [N/A:N/A] Wound Location: Left, Distal, Circumferential Foot N/A N/A Wounding Event: Thermal Burn N/A N/A Primary Etiology: 2nd degree Burn N/A N/A Comorbid History: Coronary Artery Disease, N/A N/A Hypertension, Myocardial Infarction, Type II Diabetes Date Acquired: 09/14/2021 N/A N/A Weeks of Treatment: 10 N/A N/A Wound Status: Open N/A N/A Wound Recurrence: No N/A N/A Clustered Wound: Yes N/A N/A Measurements L x W x D (cm) 0.1x0.1x0.1 N/A N/A Area (cm) : 0.008 N/A N/A Volume (cm) : 0.001 N/A N/A % Reduction in Area: 100.00% N/A N/A % Reduction in Volume: 100.00% N/A N/A Classification: Full Thickness Without Exposed N/A N/A Support Structures Exudate Amount: Medium N/A N/A Exudate Type: Serosanguineous N/A N/A Exudate Color: red, brown N/A N/A Granulation Amount: Small (1-33%) N/A N/A Granulation Quality: Red, Pink N/A N/A Necrotic Amount: Large (67-100%) N/A N/A Exposed Structures: Fat Layer (Subcutaneous Tissue): N/A N/A Yes Fascia: No Tendon: No Muscle: No Joint: No Bone: No Epithelialization: None N/A N/A Treatment Notes Electronic Signature(s) Signed: 11/27/2021 8:26:07 AM By: Carlene Coria RN Entered By: Carlene Coria on 11/26/2021 12:57:41 [name], [name] ([id number]) [name], [name]  ([id number]) -------------------------------------------------------------------------------- Multi-Disciplinary Care Plan Details Patient Name: [name], [name]. Date of Service: 11/26/2021 12:30 PM Medical Record Number: [id number] Patient Account Number: [id number] Date of Birth/Sex: [birth year] (61 y.o. M) Treating RN: Carlene Coria Primary Care Morrie Daywalt: Karl Ito Other Clinician: Referring Lauris Keepers: Karl Ito Treating Laquentin Loudermilk/Extender: Yaakov Guthrie in Treatment: 16 Active Inactive Wound/Skin Impairment Nursing Diagnoses: Knowledge deficit related to ulceration/compromised skin integrity Goals: Patient/caregiver will verbalize understanding of skin care regimen Date Initiated: 08/06/2021 Date Inactivated: 08/13/2021 Target Resolution Date: 09/06/2021 Goal Status: Met Ulcer/skin breakdown will have a volume reduction of 30% by week 4 Date Initiated: 08/06/2021 Date Inactivated: 11/05/2021 Target Resolution Date: 10/06/2021 Goal Status: Unmet Unmet Reason: comorbites Ulcer/skin breakdown  will have a volume reduction of 50% by week 8 Date Initiated: 08/06/2021 Target Resolution Date: 11/06/2021 Goal Status: Active Ulcer/skin breakdown will have a volume reduction of 80% by week 12 Date Initiated: 08/06/2021 Target Resolution Date: 12/07/2021 Goal Status: Active Ulcer/skin breakdown will heal within 14 weeks Date Initiated: 08/06/2021 Target Resolution Date: 01/04/2022 Goal Status: Active Interventions: Assess patient/caregiver ability to obtain necessary supplies Assess patient/caregiver ability to perform ulcer/skin care regimen upon admission and as needed Assess ulceration(s) every visit Notes: Electronic Signature(s) Signed: 11/27/2021 8:26:07 AM By: Carlene Coria RN Entered By: Carlene Coria on 11/26/2021 12:56:13 [name] ([id number]) -------------------------------------------------------------------------------- Pain Assessment Details Patient  Name: [name], [name]. Date of Service: 11/26/2021 12:30 PM Medical Record Number: [id number] Patient Account Number: [id number] Date of Birth/Sex: [birth year] (61 y.o. M) Treating RN: Carlene Coria Primary Care Lusia Greis: Karl Ito Other Clinician: Referring Malerie Eakins: Karl Ito Treating Santhiago Collingsworth/Extender: Yaakov Guthrie in Treatment: 16 Active Problems Location of Pain Severity and Description of Pain Patient Has Paino No Site Locations Pain Management and Medication Current Pain Management: Electronic Signature(s) Signed: 11/27/2021 8:26:07 AM By: Carlene Coria RN Entered By: Carlene Coria on 11/26/2021 12:47:29 [name] ([id number]) -------------------------------------------------------------------------------- Patient/Caregiver Education Details Patient Name: [name], [name]. Date of Service: 11/26/2021 12:30 PM Medical Record Number: [id number] Patient Account Number: [id number] Date of Birth/Gender: [birth year] (61 y.o. M) Treating RN: Carlene Coria Primary Care Physician: Karl Ito Other Clinician: Referring Physician: Karl Ito Treating Physician/Extender: Yaakov Guthrie in Treatment: 16 Education Assessment Education Provided To: Patient Education Topics Provided Wound/Skin Impairment: Methods: Explain/Verbal Responses: State content correctly Electronic Signature(s) Signed: 11/27/2021 8:26:07 AM By: Carlene Coria RN Entered By: Carlene Coria on 11/26/2021 13:14:18 [name] ([id number]) -------------------------------------------------------------------------------- Wound Assessment Details Patient Name: [name], [name]. Date of Service: 11/26/2021 12:30 PM Medical Record Number: [id number] Patient Account Number: [id number] Date of Birth/Sex: [birth year] (61 y.o. M) Treating RN: Carlene Coria Primary Care Arlando Leisinger: Karl Ito Other Clinician: Referring Lauralie Blacksher: Karl Ito Treating Taleeya Blondin/Extender: Yaakov Guthrie in Treatment:  16 Wound Status Wound Number: 6 Primary 2nd degree Burn Etiology: Wound Location: Left, Distal, Circumferential Foot Wound Status: Open Wounding Event: Thermal Burn Comorbid Coronary Artery Disease, Hypertension, Myocardial Date Acquired: 09/14/2021 History: Infarction, Type II Diabetes Weeks Of Treatment: 10 Clustered Wound: Yes Photos Wound Measurements Length: (cm) 0.1 Width: (cm) 0.1 Depth: (cm) 0.1 Area: (cm) 0.008 Volume: (cm) 0.001 % Reduction in Area: 100% % Reduction in Volume: 100% Epithelialization: None Tunneling: No Undermining: No Wound Description Classification: Full Thickness Without Exposed Support Structures Exudate Amount: Medium Exudate Type: Serosanguineous Exudate Color: red, brown Foul Odor After Cleansing: No Slough/Fibrino Yes Wound Bed Granulation Amount: Small (1-33%) Exposed Structure Granulation Quality: Red, Pink Fascia Exposed: No Necrotic Amount: Large (67-100%) Fat Layer (Subcutaneous Tissue) Exposed: Yes Necrotic Quality: Adherent Slough Tendon Exposed: No Muscle Exposed: No Joint Exposed: No Bone Exposed: No Treatment Notes Wound #6 (Foot) Wound Laterality: Left, Distal, Circumferential Cleanser Soap and Water Discharge Instruction: Gently cleanse wound with antibacterial soap, rinse and pat dry prior to dressing wounds [place]. ([id number]) Topical Primary Dressing Hydrofera Blue Ready Transfer Foam, 2.5x2.5 (in/in) Discharge Instruction: Apply Hydrofera Blue Ready to wound bed as directed Secondary Dressing Coverlet Latex-Free Fabric Adhesive Dressings Discharge Instruction: 1.5 x 2 Secured With Compression Wrap Compression Stockings Add-Ons Electronic Signature(s) Signed: 11/27/2021 8:26:07 AM By: Carlene Coria RN Entered By: Carlene Coria on 11/26/2021 12:48:08 [name] ([id number]) -------------------------------------------------------------------------------- Vitals Details Patient  Name: [name].  Date of Service: 11/26/2021 12:30 PM Medical Record Number: [id number] Patient Account Number: [id number] Date of Birth/Sex: [birth year] (61 y.o. M) Treating RN: Carlene Coria Primary Care Audrionna Lampton: Karl Ito Other Clinician: Referring Carmine Youngberg: Karl Ito Treating Kolin Erdahl/Extender: Yaakov Guthrie in Treatment: 16 Vital Signs Time Taken: 12:46 Temperature (F): 97.6 Height (in): 65 Pulse (bpm): 80 Weight (lbs): 180 Respiratory Rate (breaths/min): 18 Body Mass Index (BMI): 30 Blood Pressure (mmHg): 132/79 Reference Range: 80 - 120 mg / dl Electronic Signature(s) Signed: 11/27/2021 8:26:07 AM By: Carlene Coria RN Entered By: Carlene Coria on 11/26/2021 12:47:18
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
## 2021-12-01 DIAGNOSIS — E1165 Type 2 diabetes mellitus with hyperglycemia: Secondary | ICD-10-CM | POA: Diagnosis not present

## 2021-12-01 DIAGNOSIS — Z794 Long term (current) use of insulin: Secondary | ICD-10-CM | POA: Diagnosis not present

## 2021-12-03 ENCOUNTER — Encounter (HOSPITAL_BASED_OUTPATIENT_CLINIC_OR_DEPARTMENT_OTHER): Payer: Medicare Other | Admitting: Internal Medicine

## 2021-12-03 DIAGNOSIS — Z794 Long term (current) use of insulin: Secondary | ICD-10-CM | POA: Diagnosis not present

## 2021-12-03 DIAGNOSIS — I110 Hypertensive heart disease with heart failure: Secondary | ICD-10-CM | POA: Diagnosis not present

## 2021-12-03 DIAGNOSIS — T25222A Burn of second degree of left foot, initial encounter: Secondary | ICD-10-CM | POA: Diagnosis not present

## 2021-12-03 DIAGNOSIS — I25119 Atherosclerotic heart disease of native coronary artery with unspecified angina pectoris: Secondary | ICD-10-CM | POA: Diagnosis not present

## 2021-12-03 DIAGNOSIS — E11622 Type 2 diabetes mellitus with other skin ulcer: Secondary | ICD-10-CM | POA: Diagnosis not present

## 2021-12-03 DIAGNOSIS — L89513 Pressure ulcer of right ankle, stage 3: Secondary | ICD-10-CM

## 2021-12-03 DIAGNOSIS — F17210 Nicotine dependence, cigarettes, uncomplicated: Secondary | ICD-10-CM | POA: Diagnosis not present

## 2021-12-03 DIAGNOSIS — L97522 Non-pressure chronic ulcer of other part of left foot with fat layer exposed: Secondary | ICD-10-CM | POA: Diagnosis not present

## 2021-12-03 DIAGNOSIS — Z89422 Acquired absence of other left toe(s): Secondary | ICD-10-CM | POA: Diagnosis not present

## 2021-12-03 DIAGNOSIS — E11621 Type 2 diabetes mellitus with foot ulcer: Secondary | ICD-10-CM | POA: Diagnosis not present

## 2021-12-03 DIAGNOSIS — Z951 Presence of aortocoronary bypass graft: Secondary | ICD-10-CM | POA: Diagnosis not present

## 2021-12-03 DIAGNOSIS — I5022 Chronic systolic (congestive) heart failure: Secondary | ICD-10-CM | POA: Diagnosis not present

## 2021-12-04 DIAGNOSIS — I5020 Unspecified systolic (congestive) heart failure: Secondary | ICD-10-CM | POA: Diagnosis not present

## 2021-12-04 DIAGNOSIS — I3481 Nonrheumatic mitral (valve) annulus calcification: Secondary | ICD-10-CM | POA: Diagnosis not present
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
## 2021-12-04 NOTE — Progress Notes (Signed)
[name] [name] ([id number]) Visit Report for 12/03/2021 Arrival Information Details Patient Name: [name] [name]. Date of Service: 12/03/2021 12:30 PM Medical Record Number: [id number] Patient Account Number: [id number] Date of Birth/Sex: 1961-04-25 (61 y.o. M) Treating RN: Carlene Coria Primary Care Cheresa Siers: Karl Ito Other Clinician: Referring Anaelle Dunton: Karl Ito Treating Arlisha Patalano/Extender: Yaakov Guthrie in Treatment: 37 Visit Information History Since Last Visit All ordered tests and consults were completed: No Patient Arrived: Ambulatory Added or deleted any medications: No Arrival Time: 12:40 Any new allergies or adverse reactions: No Accompanied By: daughter Had a fall or experienced change in No Transfer Assistance: None activities of daily living that may affect Patient Identification Verified: Yes risk of falls: Secondary Verification Process Completed: Yes Signs or symptoms of abuse/neglect since last visito No Patient Requires Transmission-Based Precautions: No Hospitalized since last visit: No Patient Has Alerts: No Implantable device outside of the clinic excluding No cellular tissue based products placed in the center since last visit: Has Dressing in Place as Prescribed: Yes Pain Present Now: No Electronic Signature(s) Signed: 12/04/2021 9:01:13 AM By: Carlene Coria RN Entered By: Carlene Coria on 12/03/2021 12:44:22 [name] [name] ([id number]) -------------------------------------------------------------------------------- Clinic Level of Care Assessment Details Patient Name: [name] [name] [name] [name]. Date of Service: 12/03/2021 12:30 PM Medical Record Number: [id number] Patient Account Number: [id number] Date of Birth/Sex: [birth year] (61 y.o. M) Treating RN: Carlene Coria Primary Care Aahna Rossa: Karl Ito Other Clinician: Referring Wlliam Grosso: Karl Ito Treating Jorgia Manthei/Extender: Yaakov Guthrie in Treatment: 17 Clinic Level of Care  Assessment Items TOOL 1 Quantity Score '[]'  - Use when EandM and Procedure is performed on INITIAL visit 0 ASSESSMENTS - Nursing Assessment / Reassessment '[]'  - General Physical Exam (combine w/ comprehensive assessment (listed just below) when performed on new 0 pt. evals) '[]'  - 0 Comprehensive Assessment (HX, ROS, Risk Assessments, Wounds Hx, etc.) ASSESSMENTS - Wound and Skin Assessment / Reassessment '[]'  - Dermatologic / Skin Assessment (not related to wound area) 0 ASSESSMENTS - Ostomy and/or Continence Assessment and Care '[]'  - Incontinence Assessment and Management 0 '[]'  - 0 Ostomy Care Assessment and Management (repouching, etc.) PROCESS - Coordination of Care '[]'  - Simple Patient / Family Education for ongoing care 0 '[]'  - 0 Complex (extensive) Patient / Family Education for ongoing care '[]'  - 0 Staff obtains Programmer, systems, Records, Test Results / Process Orders '[]'  - 0 Staff telephones HHA, Nursing Homes / Clarify orders / etc '[]'  - 0 Routine Transfer to another Facility (non-emergent condition) '[]'  - 0 Routine Hospital Admission (non-emergent condition) '[]'  - 0 New Admissions / Biomedical engineer / Ordering NPWT, Apligraf, etc. '[]'  - 0 Emergency Hospital Admission (emergent condition) PROCESS - Special Needs '[]'  - Pediatric / Minor Patient Management 0 '[]'  - 0 Isolation Patient Management '[]'  - 0 Hearing / Language / Visual special needs '[]'  - 0 Assessment of Community assistance (transportation, D/C planning, etc.) '[]'  - 0 Additional assistance / Altered mentation '[]'  - 0 Support Surface(s) Assessment (bed, cushion, seat, etc.) INTERVENTIONS - Miscellaneous '[]'  - External ear exam 0 '[]'  - 0 Patient Transfer (multiple staff / Civil Service fast streamer / Similar devices) '[]'  - 0 Simple Staple / Suture removal (25 or less) '[]'  - 0 Complex Staple / Suture removal (26 or more) '[]'  - 0 Hypo/Hyperglycemic Management (do not check if billed separately) '[]'  - 0 Ankle / Brachial Index (ABI) - do not  check if billed separately Has the patient been seen at the hospital within the last three years: Yes Total Score: 0 Level  Of Care: ____ [name] [name] ([id number]) Electronic Signature(s) Signed: 12/04/2021 9:01:13 AM By: Carlene Coria RN Entered By: Carlene Coria on 12/03/2021 13:18:34 [name] [name] ([id number]) -------------------------------------------------------------------------------- Encounter Discharge Information Details Patient Name: [name] [name] [name] [name]. Date of Service: 12/03/2021 12:30 PM Medical Record Number: [id number] Patient Account Number: [id number] Date of Birth/Sex: Feb 08, 1961 (61 y.o. M) Treating RN: Carlene Coria Primary Care Arshi Duarte: Karl Ito Other Clinician: Referring Noell Lorensen: Karl Ito Treating Ras Kollman/Extender: Yaakov Guthrie in Treatment: 17 Encounter Discharge Information Items Post Procedure Vitals Discharge Condition: Stable Temperature (F): 98 Ambulatory Status: Ambulatory Pulse (bpm): 76 Discharge Destination: Home Respiratory Rate (breaths/min): 18 Transportation: Private Auto Blood Pressure (mmHg): 124/81 Accompanied By: self Schedule Follow-up Appointment: Yes Clinical Summary of Care: Patient Declined Electronic Signature(s) Signed: 12/04/2021 9:01:13 AM By: Carlene Coria RN Entered By: Carlene Coria on 12/03/2021 13:19:52 [name] [name] ([id number]) -------------------------------------------------------------------------------- Lower Extremity Assessment Details Patient Name: [name] [name] [name] [name]. Date of Service: 12/03/2021 12:30 PM Medical Record Number: [id number] Patient Account Number: [id number] Date of Birth/Sex: Sep 23, 1961 (61 y.o. M) Treating RN: Carlene Coria Primary Care Crysten Kaman: Karl Ito Other Clinician: Referring Alexxa Sabet: Karl Ito Treating Zacherie Honeyman/Extender: Yaakov Guthrie in Treatment: 17 Edema Assessment Assessed: [Left: No] Patrice Paradise: No] Edema: [Left: Ye] [Right: s] Calf Left: Right: Point of  Measurement: 34 cm From Medial Instep 32 cm Ankle Left: Right: Point of Measurement: 10 cm From Medial Instep 21 cm Vascular Assessment Pulses: Dorsalis Pedis Palpable: [Right:Yes] Electronic Signature(s) Signed: 12/04/2021 9:01:13 AM By: Carlene Coria RN Entered By: Carlene Coria on 12/03/2021 12:59:41 [name] [name] ([id number]) -------------------------------------------------------------------------------- Multi Wound Chart Details Patient Name: [name] [name]. Date of Service: 12/03/2021 12:30 PM Medical Record Number: [id number] Patient Account Number: [id number] Date of Birth/Sex: 01-03-1961 (61 y.o. 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 N/A Comorbid History: Coronary Artery Disease, Coronary Artery Disease, N/A Hypertension, Myocardial Infarction, Hypertension, Myocardial Infarction, Type II Diabetes Type II Diabetes Date Acquired: 09/14/2021 11/30/2021 N/A Weeks of Treatment: 11 0 N/A Wound Status: Open Open N/A Wound Recurrence: No No N/A Clustered Wound: Yes No N/A Measurements L x W x D (cm) 0x0x0 0.7x3.7x0.2 N/A Area (cm) : 0 2.034 N/A Volume (cm) : 0 0.407 N/A % Reduction in Area: 100.00% N/A N/A % Reduction in Volume: 100.00% N/A N/A Classification: Full Thickness Without Exposed Category/Stage III N/A Support Structures Exudate Amount: None Present Medium N/A Exudate Type: N/A Serosanguineous N/A Exudate Color: N/A red, brown N/A Granulation Amount: None Present  (0%) None Present (0%) N/A Necrotic Amount: None Present (0%) Large (67-100%) N/A Necrotic Tissue: N/A Eschar, Adherent Slough N/A Exposed Structures: Fascia: No Fascia: No N/A Fat Layer (Subcutaneous Tissue): Fat Layer (Subcutaneous Tissue): No No Tendon: No Tendon: No Muscle: No Muscle: No Joint: No Joint: No Bone: No Bone: No Epithelialization: Large (67-100%) None N/A Treatment Notes Electronic Signature(s) Signed: 12/04/2021 9:01:13 AM By: Carlene Coria RN Entered By: Carlene Coria on 12/03/2021 13:04:06 [name] [name] [name] [name] ([id number]) [name] [name] [name] [name] ([id number]) -------------------------------------------------------------------------------- [place] Details Patient Name: [name] [name] [name] [name]. Date of Service: 12/03/2021 12:30 PM Medical Record Number: [id number] Patient Account Number: [id number] Date of Birth/Sex: 1961-01-21 (61 y.o.  M) Treating RN: Carlene Coria Primary Care Cherisa Brucker: Karl Ito Other Clinician: Referring Zayven Powe: Karl Ito Treating Ludger Bones/Extender: Yaakov Guthrie in Treatment: 17 Active Inactive Wound/Skin Impairment Nursing Diagnoses: Knowledge deficit related to ulceration/compromised skin integrity Goals: Patient/caregiver will verbalize understanding of skin care regimen Date Initiated: 08/06/2021 Date Inactivated: 08/13/2021 Target Resolution Date: 09/06/2021 Goal Status: Met Ulcer/skin breakdown will have a volume reduction of 30% by week 4 Date Initiated: 08/06/2021 Date Inactivated: 11/05/2021 Target Resolution Date: 10/06/2021 Goal Status: Unmet Unmet Reason: comorbites Ulcer/skin breakdown will have a volume reduction of 50% by week 8 Date Initiated: 08/06/2021 Target Resolution Date: 11/06/2021 Goal Status: Active Ulcer/skin breakdown will have a volume reduction of 80% by week 12 Date Initiated: 08/06/2021 Target Resolution Date: 12/07/2021 Goal Status: Active Ulcer/skin breakdown will heal within 14  weeks Date Initiated: 08/06/2021 Target Resolution Date: 01/04/2022 Goal Status: Active Interventions: Assess patient/caregiver ability to obtain necessary supplies Assess patient/caregiver ability to perform ulcer/skin care regimen upon admission and as needed Assess ulceration(s) every visit Notes: Electronic Signature(s) Signed: 12/04/2021 9:01:13 AM By: Carlene Coria RN Entered By: Carlene Coria on 12/03/2021 13:03:55 [name] [name] ([id number]) -------------------------------------------------------------------------------- Pain Assessment Details Patient Name: [name] [name] [name] [name]. Date of Service: 12/03/2021 12:30 PM Medical Record Number: [id number] Patient Account Number: [id number] Date of Birth/Sex: 11/18/1960 (61 y.o. M) Treating RN: Carlene Coria Primary Care Antoine Vandermeulen: Karl Ito Other Clinician: Referring Jaime Dome: Karl Ito Treating Shatori Bertucci/Extender: Yaakov Guthrie in Treatment: 17 Active Problems Location of Pain Severity and Description of Pain Patient Has Paino No Site Locations Pain Management and Medication Current Pain Management: Electronic Signature(s) Signed: 12/04/2021 9:01:13 AM By: Carlene Coria RN Entered By: Carlene Coria on 12/03/2021 12:44:57 [name] [name] ([id number]) -------------------------------------------------------------------------------- Patient/Caregiver Education Details Patient Name: [name] [name] [name] [name]. Date of Service: 12/03/2021 12:30 PM Medical Record Number: [id number] Patient Account Number: [id number] Date of Birth/Gender: 10-12-1961 (61 y.o. M) Treating RN: Carlene Coria Primary Care Physician: Karl Ito Other Clinician: Referring Physician: Karl Ito Treating Physician/Extender: Yaakov Guthrie in Treatment: 17 Education Assessment Education Provided To: Patient Education Topics Provided Wound/Skin Impairment: Methods: Explain/Verbal Responses: State content correctly Electronic Signature(s) Signed:  12/04/2021 9:01:13 AM By: Carlene Coria RN Entered By: Carlene Coria on 12/03/2021 13:18:53 [name] [name] ([id number]) -------------------------------------------------------------------------------- Wound Assessment Details Patient Name: [name] [name] [name] [name]. Date of Service: 12/03/2021 12:30 PM Medical Record Number: [id number] Patient Account Number: [id number] Date of Birth/Sex: 1961-06-30 (61 y.o. M) Treating RN: Carlene Coria Primary Care Teddie Mehta: Karl Ito Other Clinician: Referring Ulrick Methot: Karl Ito Treating Delmi Fulfer/Extender: Yaakov Guthrie in Treatment: 17 Wound Status Wound Number: 6 Primary 2nd degree Burn Etiology: Wound Location: Left, Distal, Circumferential Foot Wound Status: Open Wounding Event: Thermal Burn Comorbid Coronary Artery Disease, Hypertension, Myocardial Date Acquired: 09/14/2021 History: Infarction, Type II Diabetes Weeks Of Treatment: 11 Clustered Wound: Yes Photos Wound Measurements Length: (cm) 0 Width: (cm) 0 Depth: (cm) 0 Area: (cm) 0 Volume: (cm) 0 % Reduction in Area: 100% % Reduction in Volume: 100% Epithelialization: Large (67-100%) Tunneling: No Undermining: No Wound Description Classification: Full Thickness Without Exposed Support Structures Exudate Amount: None Present Foul Odor After Cleansing: No Slough/Fibrino No Wound Bed Granulation Amount: None Present (0%) Exposed Structure Necrotic Amount: None Present (0%) Fascia Exposed: No Fat Layer (Subcutaneous Tissue) Exposed: No Tendon Exposed: No Muscle Exposed: No Joint Exposed: No Bone Exposed: No Electronic Signature(s) Signed: 12/04/2021 9:01:13 AM By: Carlene Coria RN Entered By: Carlene Coria on 12/03/2021 12:58:03 [name] [name] ([id number]) -------------------------------------------------------------------------------- Wound Assessment Details Patient Name: [name],  [name] F. Date of Service: 12/03/2021 12:30 PM Medical Record Number: [id number] Patient  Account Number: [id number] Date of Birth/Sex: Sep 30, 1961 (61 y.o. M) Treating RN: Carlene Coria Primary Care Mateus Rewerts: Karl Ito Other Clinician: Referring Yuette Putnam: Karl Ito Treating Arne Schlender/Extender: Yaakov Guthrie in Treatment: 17 Wound Status Wound Number: 7 Primary Pressure Ulcer Etiology: Wound Location: Right Ankle Wound Status: Open Wounding Event: Gradually Appeared Comorbid Coronary Artery Disease, Hypertension, Myocardial Date Acquired: 11/30/2021 History: Infarction, Type II Diabetes Weeks Of Treatment: 0 Clustered Wound: No Photos Wound Measurements Length: (cm) 0.7 % Reduc Width: (cm) 3.7 % Reduc Depth: (cm) 0.2 Epithel Area: (cm) 2.034 Tunnel Volume: (cm) 0.407 Underm tion in Area: tion in Volume: ialization: None ing: No ining: No Wound Description Classification: Category/Stage III Foul O Exudate Amount: Medium Slough Exudate Type: Serosanguineous Exudate Color: red, brown dor After Cleansing: No /Fibrino Yes Wound Bed Granulation Amount: None Present (0%) Exposed Structure Necrotic Amount: Large (67-100%) Fascia Exposed: No Necrotic Quality: Eschar, Adherent Slough Fat Layer (Subcutaneous Tissue) Exposed: No Tendon Exposed: No Muscle Exposed: No Joint Exposed: No Bone Exposed: No Treatment Notes Wound #7 (Ankle) Wound Laterality: Right Cleanser Peri-Wound Care Topical Primary Dressing CAPRI, RABEN ([id number]) Santyl Collagenase Ointment, 30 (gm), tube Secondary Dressing Coverlet Latex-Free Fabric Adhesive Dressings Discharge Instruction: 1.5 x 2 Gauze Discharge Instruction: As directed: dry, moistened with saline or moistened with Dakins Solution Secured With Compression Wrap Compression Stockings Add-Ons Electronic Signature(s) Signed: 12/04/2021 9:01:13 AM By: Carlene Coria RN Entered By: Carlene Coria on 12/03/2021 12:57:10 [name] [name]  ([id number]) -------------------------------------------------------------------------------- Vitals Details Patient Name: [name] [name]. Date of Service: 12/03/2021 12:30 PM Medical Record Number: [id number] Patient Account Number: [id number] Date of Birth/Sex: 07-09-1961 (61 y.o. M) Treating RN: Carlene Coria Primary Care Haruye Lainez: Karl Ito Other Clinician: Referring Angelica Wix: Karl Ito Treating Daneli Butkiewicz/Extender: Yaakov Guthrie in Treatment: 17 Vital Signs Time Taken: 12:44 Temperature (F): 98 Height (in): 65 Pulse (bpm): 76 Weight (lbs): 180 Respiratory Rate (breaths/min): 18 Body Mass Index (BMI): 30 Blood Pressure (mmHg): 124/81 Reference Range: 80 - 120 mg / dl Electronic Signature(s) Signed: 12/04/2021 9:01:13 AM By: Carlene Coria RN Entered By: Carlene Coria on 12/03/2021 12:44:49

## 2021-12-10 ENCOUNTER — Encounter (HOSPITAL_BASED_OUTPATIENT_CLINIC_OR_DEPARTMENT_OTHER): Payer: Medicare Other | Admitting: Internal Medicine

## 2021-12-10 ENCOUNTER — Other Ambulatory Visit: Payer: Self-pay

## 2021-12-10 DIAGNOSIS — I5022 Chronic systolic (congestive) heart failure: Secondary | ICD-10-CM | POA: Diagnosis not present

## 2021-12-10 DIAGNOSIS — E11622 Type 2 diabetes mellitus with other skin ulcer: Secondary | ICD-10-CM | POA: Diagnosis not present

## 2021-12-10 DIAGNOSIS — E11621 Type 2 diabetes mellitus with foot ulcer: Secondary | ICD-10-CM | POA: Diagnosis not present

## 2021-12-10 DIAGNOSIS — F17210 Nicotine dependence, cigarettes, uncomplicated: Secondary | ICD-10-CM | POA: Diagnosis not present

## 2021-12-10 DIAGNOSIS — Z794 Long term (current) use of insulin: Secondary | ICD-10-CM | POA: Diagnosis not present

## 2021-12-10 DIAGNOSIS — T25222A Burn of second degree of left foot, initial encounter: Secondary | ICD-10-CM | POA: Diagnosis not present

## 2021-12-10 DIAGNOSIS — L89513 Pressure ulcer of right ankle, stage 3: Secondary | ICD-10-CM

## 2021-12-10 DIAGNOSIS — Z89422 Acquired absence of other left toe(s): Secondary | ICD-10-CM | POA: Diagnosis not present

## 2021-12-10 DIAGNOSIS — L97522 Non-pressure chronic ulcer of other part of left foot with fat layer exposed: Secondary | ICD-10-CM | POA: Diagnosis not present

## 2021-12-10 DIAGNOSIS — I110 Hypertensive heart disease with heart failure: Secondary | ICD-10-CM | POA: Diagnosis not present

## 2021-12-10 DIAGNOSIS — Z951 Presence of aortocoronary bypass graft: Secondary | ICD-10-CM | POA: Diagnosis not present

## 2021-12-10 DIAGNOSIS — I25119 Atherosclerotic heart disease of native coronary artery with unspecified angina pectoris: Secondary | ICD-10-CM | POA: Diagnosis not present

## 2021-12-10 NOTE — Progress Notes (Signed)
RONIEL, HALLORAN ([id number]) Visit Report for 12/10/2021 Arrival Information Details Patient Name: [name], [name]. Date of Service: 12/10/2021 12:30 PM Medical Record Number: [id number] Patient Account Number: [id number] Date of Birth/Sex: Aug 06, 1961 (61 y.o. M) Treating RN: Carlene Coria Primary Care Pate Aylward: Karl Ito Other Clinician: Referring Micha Dosanjh: Karl Ito Treating Virginia Francisco/Extender: Yaakov Guthrie in Treatment: 18 Visit Information History Since Last Visit All ordered tests and consults were completed: No Patient Arrived: Ambulatory Added or deleted any medications: No Arrival Time: 12:36 Any new allergies or adverse reactions: No Accompanied By: self Had a fall or experienced change in No Transfer Assistance: None activities of daily living that may affect Patient Identification Verified: Yes risk of falls: Secondary Verification Process Completed: Yes Signs or symptoms of abuse/neglect since last visito No Patient Requires Transmission-Based Precautions: No Hospitalized since last visit: No Patient Has Alerts: No Implantable device outside of the clinic excluding No cellular tissue based products placed in the center since last visit: Has Dressing in Place as Prescribed: Yes Pain Present Now: No Electronic Signature(s) Signed: 12/10/2021 3:39:51 PM By: Carlene Coria RN Entered By: Carlene Coria on 12/10/2021 12:40:23 [name] ([id number]) -------------------------------------------------------------------------------- Clinic Level of Care Assessment Details Patient Name: [name]. Date of Service: 12/10/2021 12:30 PM Medical Record Number: [id number] Patient Account Number: [id number] Date of Birth/Sex: 15-Dec-1960 (61 y.o. M) Treating RN: Carlene Coria Primary Care Celestino Ackerman: Karl Ito Other Clinician: Referring Shandrell Boda: Karl Ito Treating Majesty Oehlert/Extender: Yaakov Guthrie in Treatment: 18 Clinic Level of Care Assessment  Items TOOL 1 Quantity Score _0  - Use when EandM and Procedure is performed on INITIAL visit 0 ASSESSMENTS - Nursing Assessment / Reassessment _1  - General Physical Exam (combine w/ comprehensive assessment (listed just below) when performed on new 0 pt. evals) _2  - 0 Comprehensive Assessment (HX, ROS, Risk Assessments, Wounds Hx, etc.) ASSESSMENTS - Wound and Skin Assessment / Reassessment _3  - Dermatologic / Skin Assessment (not related to wound area) 0 ASSESSMENTS - Ostomy and/or Continence Assessment and Care _4  - Incontinence Assessment and Management 0 _5  - 0 Ostomy Care Assessment and Management (repouching, etc.) PROCESS - Coordination of Care _6  - Simple Patient / Family Education for ongoing care 0 _7  - 0 Complex (extensive) Patient / Family Education for ongoing care _8  - 0 Staff obtains Programmer, systems, Records, Test Results / Process Orders _9  - 0 Staff telephones HHA, Nursing Homes / Clarify orders / etc _10  - 0 Routine Transfer to another Facility (non-emergent condition) _11  - 0 Routine Hospital Admission (non-emergent condition) _12  - 0 New Admissions / Biomedical engineer / Ordering NPWT, Apligraf, etc. _13  - 0 Emergency Hospital Admission (emergent condition) PROCESS - Special Needs _14  - Pediatric / Minor Patient Management 0 _15  - 0 Isolation Patient Management _16  - 0 Hearing / Language / Visual special needs _17  - 0 Assessment of Community assistance (transportation, D/C planning, etc.) _18  - 0 Additional assistance / Altered mentation _19  - 0 Support Surface(s) Assessment (bed, cushion, seat, etc.) INTERVENTIONS - Miscellaneous _20  - External ear exam 0 _21  - 0 Patient Transfer (multiple staff / Civil Service fast streamer / Similar devices) _22  - 0 Simple Staple / Suture removal (25 or less) _23  - 0 Complex Staple / Suture removal (26 or more) _24  - 0 Hypo/Hyperglycemic Management (do not check if billed separately) _25  - 0 Ankle / Brachial Index (ABI) - do not check if  billed separately Has the patient been seen at the hospital within the last three years: Yes Total Score: 0 Level  Of Care: ____ [name] ([id number]) Electronic Signature(s) Signed: 12/10/2021 3:39:51 PM By: Carlene Coria RN Entered By: Carlene Coria on 12/10/2021 12:57:10 DAWSEN, KRIEGER ([id number]) -------------------------------------------------------------------------------- Encounter Discharge Information Details Patient Name: [name]. Date of Service: 12/10/2021 12:30 PM Medical Record Number: [id number] Patient Account Number: [id number] Date of Birth/Sex: 07/05/1961 (61 y.o. M) Treating RN: Carlene Coria Primary Care Caelan Branden: Karl Ito Other Clinician: Referring Jaxsyn Catalfamo: Karl Ito Treating Thurston Brendlinger/Extender: Yaakov Guthrie in Treatment: 69 Encounter Discharge Information Items Post Procedure Vitals Discharge Condition: Stable Temperature (F): 97.9 Ambulatory Status: Ambulatory Pulse (bpm): 80 Discharge Destination: Home Respiratory Rate (breaths/min): 18 Transportation: Private Auto Blood Pressure (mmHg): 98/65 Accompanied By: self Schedule Follow-up Appointment: Yes Clinical Summary of Care: Patient Declined Electronic Signature(s) Signed: 12/10/2021 3:39:51 PM By: Carlene Coria RN Entered By: Carlene Coria on 12/10/2021 12:58:19 [name] ([id number]) -------------------------------------------------------------------------------- Lower Extremity Assessment Details Patient Name: CHANEY, MACLAREN. Date of Service: 12/10/2021 12:30 PM Medical Record Number: [id number] Patient Account Number: [id number] Date of Birth/Sex: [birth year] (61 y.o. M) Treating RN: Carlene Coria Primary Care Kaedynce Tapp: Karl Ito Other Clinician: Referring Gurkirat Basher: Karl Ito Treating Lucyann Romano/Extender: Yaakov Guthrie in Treatment: 18 Edema Assessment Assessed: [Left: No] Patrice Paradise: No] Edema: [Left: Ye] [Right: s] Calf Left: Right: Point of  Measurement: 34 cm From Medial Instep 32 cm Ankle Left: Right: Point of Measurement: 10 cm From Medial Instep 20 cm Vascular Assessment Pulses: Dorsalis Pedis Palpable: [Right:Yes] Electronic Signature(s) Signed: 12/10/2021 3:39:51 PM By: Carlene Coria RN Entered By: Carlene Coria on 12/10/2021 12:48:16 [name] ([id number]) -------------------------------------------------------------------------------- Multi Wound Chart Details Patient Name: [name] Date of Service: 12/10/2021 12:30 PM Medical Record Number: [id number] Patient Account Number: [id number] Date of Birth/Sex: 12-Jan-1961 (61 y.o. M) Treating RN: Carlene Coria Primary Care Inessa Wardrop: Karl Ito Other Clinician: Referring Barbra Miner: Karl Ito Treating Isela Stantz/Extender: Yaakov Guthrie in Treatment: 18 Vital Signs Height(in): 65 Pulse(bpm): 80 Weight(lbs): 180 Blood Pressure(mmHg): 98/65 Body Mass Index(BMI): 30 Temperature(F): 97.9 Respiratory Rate(breaths/min): 18 Photos: [N/A:N/A] Wound Location: Right Ankle N/A N/A Wounding Event: Gradually Appeared N/A N/A Primary Etiology: Pressure Ulcer N/A N/A Comorbid History: Coronary Artery Disease, N/A N/A Hypertension, Myocardial Infarction, Type II Diabetes Date Acquired: 11/30/2021 N/A N/A Weeks of Treatment: 1 N/A N/A Wound Status: Open N/A N/A Wound Recurrence: No N/A N/A Measurements L x W x D (cm) 3x5x0.2 N/A N/A Area (cm) : 11.781 N/A N/A Volume (cm) : 2.356 N/A N/A % Reduction in Area: -479.20% N/A N/A % Reduction in Volume: -478.90% N/A N/A Classification: Category/Stage III N/A N/A Exudate Amount: Medium N/A N/A Exudate Type: Serosanguineous N/A N/A Exudate Color: red, brown N/A N/A Granulation Amount: None Present (0%) N/A N/A Necrotic Amount: Large (67-100%) N/A N/A Necrotic Tissue: Eschar, Adherent Slough N/A N/A Exposed Structures: Fascia: No N/A N/A Fat Layer (Subcutaneous Tissue): No Tendon: No Muscle: No Joint:  No Bone: No Epithelialization: None N/A N/A Treatment Notes Electronic Signature(s) Signed: 12/10/2021 3:39:51 PM By: Carlene Coria RN Entered By: Carlene Coria on 12/10/2021 12:49:55 [name] ([id number]) -------------------------------------------------------------------------------- [place] Plan Details Patient Name: KINSTON, MAGNAN. Date of Service: 12/10/2021 12:30 PM Medical Record Number: [id number] Patient Account Number: [id number] Date of Birth/Sex: [birth year] (61 y.o. M) Treating RN: Carlene Coria Primary Care Thoren Hosang: Karl Ito Other Clinician: Referring Emmalise Huard: Karl Ito Treating Haili Donofrio/Extender: Yaakov Guthrie in Treatment: 84 Active Inactive Wound/Skin Impairment Nursing Diagnoses: Knowledge deficit related to ulceration/compromised skin integrity Goals: Patient/caregiver will verbalize understanding of skin care regimen Date Initiated:  08/06/2021 Date Inactivated: 08/13/2021 Target Resolution Date: 09/06/2021 Goal Status: Met Ulcer/skin breakdown will have a volume reduction of 30% by week 4 Date Initiated: 08/06/2021 Date Inactivated: 11/05/2021 Target Resolution Date: 10/06/2021 Goal Status: Unmet Unmet Reason: comorbites Ulcer/skin breakdown will have a volume reduction of 50% by week 8 Date Initiated: 08/06/2021 Target Resolution Date: 11/06/2021 Goal Status: Active Ulcer/skin breakdown will have a volume reduction of 80% by week 12 Date Initiated: 08/06/2021 Target Resolution Date: 12/07/2021 Goal Status: Active Ulcer/skin breakdown will heal within 14 weeks Date Initiated: 08/06/2021 Target Resolution Date: 01/04/2022 Goal Status: Active Interventions: Assess patient/caregiver ability to obtain necessary supplies Assess patient/caregiver ability to perform ulcer/skin care regimen upon admission and as needed Assess ulceration(s) every visit Notes: Electronic Signature(s) Signed: 12/10/2021 3:39:51 PM By: Carlene Coria RN Entered By: Carlene Coria on 12/10/2021 12:49:05 [name] ([id number]) -------------------------------------------------------------------------------- Pain Assessment Details Patient Name: RANI, SISNEY. Date of Service: 12/10/2021 12:30 PM Medical Record Number: [id number] Patient Account Number: [id number] Date of Birth/Sex: August 08, 1961 (61 y.o. M) Treating RN: Carlene Coria Primary Care Anothy Bufano: Karl Ito Other Clinician: Referring Mayzie Caughlin: Karl Ito Treating Besse Miron/Extender: Yaakov Guthrie in Treatment: 18 Active Problems Location of Pain Severity and Description of Pain Patient Has Paino No Site Locations Pain Management and Medication Current Pain Management: Electronic Signature(s) Signed: 12/10/2021 3:39:51 PM By: Carlene Coria RN Entered By: Carlene Coria on 12/10/2021 12:44:49 [name] ([id number]) -------------------------------------------------------------------------------- Patient/Caregiver Education Details Patient Name: FIELD, STANISZEWSKI. Date of Service: 12/10/2021 12:30 PM Medical Record Number: [id number] Patient Account Number: [id number] Date of Birth/Gender: Mar 14, 1961 (61 y.o. M) Treating RN: Carlene Coria Primary Care Physician: Karl Ito Other Clinician: Referring Physician: Karl Ito Treating Physician/Extender: Yaakov Guthrie in Treatment: 57 Education Assessment Education Provided To: Patient Education Topics Provided Wound/Skin Impairment: Methods: Explain/Verbal Responses: State content correctly Electronic Signature(s) Signed: 12/10/2021 3:39:51 PM By: Carlene Coria RN Entered By: Carlene Coria on 12/10/2021 12:57:28 BRAY, VICKERMAN ([id number]) -------------------------------------------------------------------------------- Wound Assessment Details Patient Name: LISTER, BRIZZI. Date of Service: 12/10/2021 12:30 PM Medical Record Number: [id number] Patient Account Number: [id number] Date of  Birth/Sex: 05/15/1961 (61 y.o. M) Treating RN: Carlene Coria Primary Care Hilari Wethington: Karl Ito Other Clinician: Referring Marvin Grabill: Karl Ito Treating Keamber Macfadden/Extender: Yaakov Guthrie in Treatment: 18 Wound Status Wound Number: 7 Primary Pressure Ulcer Etiology: Wound Location: Right Ankle Wound Status: Open Wounding Event: Gradually Appeared Comorbid Coronary Artery Disease, Hypertension, Myocardial Date Acquired: 11/30/2021 History: Infarction, Type II Diabetes Weeks Of Treatment: 1 Clustered Wound: No Photos Wound Measurements Length: (cm) 3 % Reduc Width: (cm) 5 % Reduc Depth: (cm) 0.2 Epithel Area: (cm) 11.781 Tunnel Volume: (cm) 2.356 Underm tion in Area: -479.2% tion in Volume: -478.9% ialization: None ing: No ining: No Wound Description Classification: Category/Stage III Foul O Exudate Amount: Medium Slough Exudate Type: Serosanguineous Exudate Color: red, brown dor After Cleansing: No /Fibrino Yes Wound Bed Granulation Amount: None Present (0%) Exposed Structure Necrotic Amount: Large (67-100%) Fascia Exposed: No Necrotic Quality: Eschar, Adherent Slough Fat Layer (Subcutaneous Tissue) Exposed: No Tendon Exposed: No Muscle Exposed: No Joint Exposed: No Bone Exposed: No Treatment Notes Wound #7 (Ankle) Wound Laterality: Right Cleanser Peri-Wound Care Topical Primary Dressing SALADIN, PETRELLI ([id number]) Santyl Collagenase Ointment, 30 (gm), tube Secondary Dressing Coverlet Latex-Free Fabric Adhesive Dressings Discharge Instruction: 1.5 x 2 Gauze Discharge Instruction: As directed: dry, moistened with saline or moistened with Dakins Solution Secured With Compression Wrap Compression Stockings Add-Ons Electronic Signature(s) Signed: 12/10/2021 3:39:51 PM By: Carlene Coria RN  Entered By: Carlene Coria on 12/10/2021 12:46:14 [name]  ([id number]) -------------------------------------------------------------------------------- [place] Details Patient Name: NAVEN, GIAMBALVO. Date of Service: 12/10/2021 12:30 PM Medical Record Number: [id number] Patient Account Number: [id number] Date of Birth/Sex: 1961-04-03 (61 y.o. M) Treating RN: Carlene Coria Primary Care Billyjoe Go: Karl Ito Other Clinician: Referring Deshayla Empson: Karl Ito Treating Yordy Matton/Extender: Yaakov Guthrie in Treatment: 18 Vital Signs Time Taken: 12:40 Temperature (F): 97.9 Height (in): 65 Pulse (bpm): 80 Weight (lbs): 180 Respiratory Rate (breaths/min): 18 Body Mass Index (BMI): 30 Blood Pressure (mmHg): 98/65 Reference Range: 80 - 120 mg / dl Electronic Signature(s) Signed: 12/10/2021 3:39:51 PM By: Carlene Coria RN Entered By: Carlene Coria on 12/10/2021 12:40:48
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
## 2021-12-17 ENCOUNTER — Other Ambulatory Visit: Payer: Self-pay

## 2021-12-17 ENCOUNTER — Encounter: Payer: Medicare Other | Attending: Internal Medicine | Admitting: Internal Medicine

## 2021-12-17 DIAGNOSIS — I25119 Atherosclerotic heart disease of native coronary artery with unspecified angina pectoris: Secondary | ICD-10-CM | POA: Diagnosis not present

## 2021-12-17 DIAGNOSIS — F17210 Nicotine dependence, cigarettes, uncomplicated: Secondary | ICD-10-CM | POA: Insufficient documentation

## 2021-12-17 DIAGNOSIS — E11622 Type 2 diabetes mellitus with other skin ulcer: Secondary | ICD-10-CM | POA: Insufficient documentation

## 2021-12-17 DIAGNOSIS — Z951 Presence of aortocoronary bypass graft: Secondary | ICD-10-CM | POA: Diagnosis not present

## 2021-12-17 DIAGNOSIS — E1165 Type 2 diabetes mellitus with hyperglycemia: Secondary | ICD-10-CM | POA: Insufficient documentation

## 2021-12-17 DIAGNOSIS — L97522 Non-pressure chronic ulcer of other part of left foot with fat layer exposed: Secondary | ICD-10-CM | POA: Diagnosis not present

## 2021-12-17 DIAGNOSIS — I5022 Chronic systolic (congestive) heart failure: Secondary | ICD-10-CM | POA: Diagnosis not present

## 2021-12-17 DIAGNOSIS — L89513 Pressure ulcer of right ankle, stage 3: Secondary | ICD-10-CM | POA: Insufficient documentation

## 2021-12-17 DIAGNOSIS — Z89422 Acquired absence of other left toe(s): Secondary | ICD-10-CM | POA: Diagnosis not present

## 2021-12-17 DIAGNOSIS — I110 Hypertensive heart disease with heart failure: Secondary | ICD-10-CM | POA: Diagnosis not present

## 2021-12-17 NOTE — Progress Notes (Signed)
[name] ([id number]) Visit Report for 12/17/2021 Arrival Information Details Patient Name: [name], [name]. Date of Service: 12/17/2021 2:00 PM Medical Record Number: [id number] Patient Account Number: [id number] Date of Birth/Sex: [birth year] (61 y.o. M) Treating RN: Carlene Coria Primary Care Johnrobert Foti: Karl Ito Other Clinician: Referring Gavinn Collard: Karl Ito Treating Myers Tutterow/Extender: Yaakov Guthrie in Treatment: 36 Visit Information History Since Last Visit All ordered tests and consults were completed: No Patient Arrived: Ambulatory Added or deleted any medications: No Arrival Time: 14:14 Any new allergies or adverse reactions: No Accompanied By: self Had a fall or experienced change in No Transfer Assistance: None activities of daily living that may affect Patient Identification Verified: Yes risk of falls: Secondary Verification Process Completed: Yes Signs or symptoms of abuse/neglect since last visito No Patient Requires Transmission-Based Precautions: No Hospitalized since last visit: No Patient Has Alerts: No Implantable device outside of the clinic excluding No cellular tissue based products placed in the center since last visit: Has Dressing in Place as Prescribed: Yes Pain Present Now: No Electronic Signature(s) Signed: 12/17/2021 2:38:11 PM By: Carlene Coria RN Entered By: Carlene Coria on 12/17/2021 14:15:15 [name] ([id number]) -------------------------------------------------------------------------------- Clinic Level of Care Assessment Details Patient Name: [name], [name]. Date of Service: 12/17/2021 2:00 PM Medical Record Number: [id number] Patient Account Number: [id number] Date of Birth/Sex: Mar 31, 1961 (61 y.o. M) Treating RN: Carlene Coria Primary Care Shaheen Star: Karl Ito Other Clinician: Referring Huxley Shurley: Karl Ito Treating Caileen Veracruz/Extender: Yaakov Guthrie in Treatment: 19 Clinic Level of Care Assessment  Items TOOL 4 Quantity Score []  - Use when only an EandM is performed on FOLLOW-UP visit 0 ASSESSMENTS - Nursing Assessment / Reassessment []  - Reassessment of Co-morbidities (includes updates in patient status) 0 []  - 0 Reassessment of Adherence to Treatment Plan ASSESSMENTS - Wound and Skin Assessment / Reassessment []  - Simple Wound Assessment / Reassessment - one wound 0 []  - 0 Complex Wound Assessment / Reassessment - multiple wounds []  - 0 Dermatologic / Skin Assessment (not related to wound area) ASSESSMENTS - Focused Assessment []  - Circumferential Edema Measurements - multi extremities 0 []  - 0 Nutritional Assessment / Counseling / Intervention []  - 0 Lower Extremity Assessment (monofilament, tuning fork, pulses) []  - 0 Peripheral Arterial Disease Assessment (using hand held doppler) ASSESSMENTS - Ostomy and/or Continence Assessment and Care []  - Incontinence Assessment and Management 0 []  - 0 Ostomy Care Assessment and Management (repouching, etc.) PROCESS - Coordination of Care []  - Simple Patient / Family Education for ongoing care 0 []  - 0 Complex (extensive) Patient / Family Education for ongoing care []  - 0 Staff obtains Programmer, systems, Records, Test Results / Process Orders []  - 0 Staff telephones HHA, Nursing Homes / Clarify orders / etc []  - 0 Routine Transfer to another Facility (non-emergent condition) []  - 0 Routine Hospital Admission (non-emergent condition) []  - 0 New Admissions / Biomedical engineer / Ordering NPWT, Apligraf, etc. []  - 0 Emergency Hospital Admission (emergent condition) []  - 0 Simple Discharge Coordination []  - 0 Complex (extensive) Discharge Coordination PROCESS - Special Needs []  - Pediatric / Minor Patient Management 0 []  - 0 Isolation Patient Management []  - 0 Hearing / Language / Visual special needs []  - 0 Assessment of Community assistance (transportation, D/C planning, etc.) []  - 0 Additional assistance / Altered  mentation []  - 0 Support Surface(s) Assessment (bed, cushion, seat, etc.) INTERVENTIONS - Wound Cleansing / Measurement [name] F. ([id number]) []  - 0 Simple Wound Cleansing - one wound []  -  0 Complex Wound Cleansing - multiple wounds []  - 0 Wound Imaging (photographs - any number of wounds) []  - 0 Wound Tracing (instead of photographs) []  - 0 Simple Wound Measurement - one wound []  - 0 Complex Wound Measurement - multiple wounds INTERVENTIONS - Wound Dressings []  - Small Wound Dressing one or multiple wounds 0 []  - 0 Medium Wound Dressing one or multiple wounds []  - 0 Large Wound Dressing one or multiple wounds []  - 0 Application of Medications - topical []  - 0 Application of Medications - injection INTERVENTIONS - Miscellaneous []  - External ear exam 0 []  - 0 Specimen Collection (cultures, biopsies, blood, body fluids, etc.) []  - 0 Specimen(s) / Culture(s) sent or taken to Lab for analysis []  - 0 Patient Transfer (multiple staff / Civil Service fast streamer / Similar devices) []  - 0 Simple Staple / Suture removal (25 or less) []  - 0 Complex Staple / Suture removal (26 or more) []  - 0 Hypo / Hyperglycemic Management (close monitor of Blood Glucose) []  - 0 Ankle / Brachial Index (ABI) - do not check if billed separately []  - 0 Vital Signs Has the patient been seen at the hospital within the last three years: Yes Total Score: 0 Level Of Care: ____ Electronic Signature(s) Signed: 12/17/2021 2:38:11 PM By: Carlene Coria RN Entered By: Carlene Coria on 12/17/2021 14:25:56 [name] ([id number]) -------------------------------------------------------------------------------- Encounter Discharge Information Details Patient Name: [name], [name]. Date of Service: 12/17/2021 2:00 PM Medical Record Number: [id number] Patient Account Number: [id number] Date of Birth/Sex: 22-Oct-1960 (61 y.o. M) Treating RN: Carlene Coria Primary Care Shuntavia Yerby: Karl Ito Other  Clinician: Referring Sandon Yoho: Karl Ito Treating Zalman Hull/Extender: Yaakov Guthrie in Treatment: 68 Encounter Discharge Information Items Post Procedure Vitals Discharge Condition: Stable Temperature (F): 98.3 Ambulatory Status: Ambulatory Pulse (bpm): 89 Discharge Destination: Home Respiratory Rate (breaths/min): 18 Transportation: Private Auto Blood Pressure (mmHg): 117/71 Accompanied By: self Schedule Follow-up Appointment: Yes Clinical Summary of Care: Patient Declined Electronic Signature(s) Signed: 12/17/2021 2:38:11 PM By: Carlene Coria RN Entered By: Carlene Coria on 12/17/2021 14:27:14 [name] ([id number]) -------------------------------------------------------------------------------- Lower Extremity Assessment Details Patient Name: [name], [name]. Date of Service: 12/17/2021 2:00 PM Medical Record Number: [id number] Patient Account Number: [id number] Date of Birth/Sex: 25-Dec-1960 (61 y.o. M) Treating RN: Carlene Coria Primary Care Awa Bachicha: Karl Ito Other Clinician: Referring Chrissie Dacquisto: Karl Ito Treating Paulo Keimig/Extender: Yaakov Guthrie in Treatment: 19 Edema Assessment Assessed: [Left: No] Patrice Paradise: No] Edema: [Left: N] [Right: o] Calf Left: Right: Point of Measurement: 34 cm From Medial Instep 30 cm Ankle Left: Right: Point of Measurement: 10 cm From Medial Instep 20 cm Vascular Assessment Pulses: Dorsalis Pedis Palpable: [Right:Yes] Electronic Signature(s) Signed: 12/17/2021 2:38:11 PM By: Carlene Coria RN Entered By: Carlene Coria on 12/17/2021 14:17:48 [name] ([id number]) -------------------------------------------------------------------------------- Multi Wound Chart Details Patient Name: [name]. Date of Service: 12/17/2021 2:00 PM Medical Record Number: [id number] Patient Account Number: [id number] Date of Birth/Sex: 06-Apr-1961 (61 y.o. M) Treating RN: Carlene Coria Primary Care Darienne Belleau: Karl Ito Other  Clinician: Referring Mackenzie Lia: Karl Ito Treating Shaketha Jeon/Extender: Yaakov Guthrie in Treatment: 19 Vital Signs Height(in): 65 Pulse(bpm): 70 Weight(lbs): 180 Blood Pressure(mmHg): 117/71 Body Mass Index(BMI): 30 Temperature(F): 98.3 Respiratory Rate(breaths/min): 18 Photos: [N/A:N/A] Wound Location: Right Ankle N/A N/A Wounding Event: Gradually Appeared N/A N/A Primary Etiology: Pressure Ulcer N/A N/A Comorbid History: Coronary Artery Disease, N/A N/A Hypertension, Myocardial Infarction, Type II Diabetes Date Acquired: 11/30/2021 N/A N/A Weeks of Treatment: 2 N/A N/A Wound Status: Open  N/A N/A Wound Recurrence: No N/A N/A Measurements L x W x D (cm) 2.5x5x0.2 N/A N/A Area (cm) : 9.817 N/A N/A Volume (cm) : 1.963 N/A N/A % Reduction in Area: -382.60% N/A N/A % Reduction in Volume: -382.30% N/A N/A Classification: Category/Stage III N/A N/A Exudate Amount: Medium N/A N/A Exudate Type: Serosanguineous N/A N/A Exudate Color: red, brown N/A N/A Granulation Amount: None Present (0%) N/A N/A Necrotic Amount: Large (67-100%) N/A N/A Necrotic Tissue: Eschar, Adherent Slough N/A N/A Exposed Structures: Fascia: No N/A N/A Fat Layer (Subcutaneous Tissue): No Tendon: No Muscle: No Joint: No Bone: No Epithelialization: None N/A N/A Treatment Notes Electronic Signature(s) Signed: 12/17/2021 2:38:11 PM By: Carlene Coria RN Entered By: Carlene Coria on 12/17/2021 14:19:07 [name] ([id number]) -------------------------------------------------------------------------------- [place] Details Patient Name: [name], [name]. Date of Service: 12/17/2021 2:00 PM Medical Record Number: [id number] Patient Account Number: [id number] Date of Birth/Sex: 1961/02/25 (61 y.o. M) Treating RN: Carlene Coria Primary Care Caffie Sotto: Karl Ito Other Clinician: Referring Langley Flatley: Karl Ito Treating Abdulrahim Siddiqi/Extender: Yaakov Guthrie in Treatment:  71 Active Inactive Electronic Signature(s) Signed: 12/17/2021 2:38:11 PM By: Carlene Coria RN Entered By: Carlene Coria on 12/17/2021 14:18:51 ODA, LANSDOWNE ([id number]) -------------------------------------------------------------------------------- Pain Assessment Details Patient Name: [name], [name]. Date of Service: 12/17/2021 2:00 PM Medical Record Number: [id number] Patient Account Number: [id number] Date of Birth/Sex: 1961/06/30 (61 y.o. M) Treating RN: Carlene Coria Primary Care Carinne Brandenburger: Karl Ito Other Clinician: Referring Cadell Gabrielson: Karl Ito Treating Shital Crayton/Extender: Yaakov Guthrie in Treatment: 17 Active Problems Location of Pain Severity and Description of Pain Patient Has Paino No Site Locations Pain Management and Medication Current Pain Management: Electronic Signature(s) Signed: 12/17/2021 2:38:11 PM By: Carlene Coria RN Entered By: Carlene Coria on 12/17/2021 14:16:01 [name] ([id number]) -------------------------------------------------------------------------------- Patient/Caregiver Education Details Patient Name: [name], [name]. Date of Service: 12/17/2021 2:00 PM Medical Record Number: [id number] Patient Account Number: [id number] Date of Birth/Gender: 1960-11-21 (61 y.o. M) Treating RN: Carlene Coria Primary Care Physician: Karl Ito Other Clinician: Referring Physician: Karl Ito Treating Physician/Extender: Yaakov Guthrie in Treatment: 30 Education Assessment Education Provided To: Patient Education Topics Provided Wound/Skin Impairment: Methods: Explain/Verbal Responses: State content correctly Electronic Signature(s) Signed: 12/17/2021 2:38:11 PM By: Carlene Coria RN Entered By: Carlene Coria on 12/17/2021 14:26:17 [name], [name] ([id number]) -------------------------------------------------------------------------------- Wound Assessment Details Patient Name: [name], [name]. Date of Service: 12/17/2021 2:00  PM Medical Record Number: [id number] Patient Account Number: [id number] Date of Birth/Sex: [birth year] (61 y.o. M) Treating RN: Carlene Coria Primary Care Javaris Wigington: Karl Ito Other Clinician: Referring Chardonnay Holzmann: Karl Ito Treating Yena Tisby/Extender: Yaakov Guthrie in Treatment: 19 Wound Status Wound Number: 7 Primary Pressure Ulcer Etiology: Wound Location: Right Ankle Wound Status: Open Wounding Event: Gradually Appeared Comorbid Coronary Artery Disease, Hypertension, Myocardial Date Acquired: 11/30/2021 History: Infarction, Type II Diabetes Weeks Of Treatment: 2 Clustered Wound: No Photos Wound Measurements Length: (cm) 2.5 % Reduct Width: (cm) 5 % Reduct Depth: (cm) 0.2 Epitheli Area: (cm) 9.817 Tunneli Volume: (cm) 1.963 Undermi ion in Area: -382.6% ion in Volume: -382.3% alization: None ng: No ning: No Wound Description Classification: Category/Stage III Foul Od Exudate Amount: Medium Slough/ Exudate Type: Serosanguineous Exudate Color: red, brown or After Cleansing: No Fibrino Yes Wound Bed Granulation Amount: None Present (0%) Exposed Structure Necrotic Amount: Large (67-100%) Fascia Exposed: No Necrotic Quality: Eschar, Adherent Slough Fat Layer (Subcutaneous Tissue) Exposed: No Tendon Exposed: No Muscle Exposed: No Joint Exposed: No Bone Exposed: No Treatment Notes Wound #7 (Ankle) Wound Laterality:  Right Cleanser Peri-Wound Care Topical Primary Dressing [name], [name] ([id number]) Santyl Collagenase Ointment, 30 (gm), tube Secondary Dressing Coverlet Latex-Free Fabric Adhesive Dressings Discharge Instruction: 1.5 x 2 Gauze Discharge Instruction: As directed: dry, moistened with saline or moistened with Dakins Solution Secured With Compression Wrap Compression Stockings Add-Ons Electronic Signature(s) Signed: 12/17/2021 2:38:11 PM By: Carlene Coria RN Entered By: Carlene Coria on 12/17/2021 14:16:41 [name]  ([id number]) -------------------------------------------------------------------------------- Vitals Details Patient Name: [name]. Date of Service: 12/17/2021 2:00 PM Medical Record Number: [id number] Patient Account Number: [id number] Date of Birth/Sex: 1961-05-01 (61 y.o. M) Treating RN: Carlene Coria Primary Care Josette Shimabukuro: Karl Ito Other Clinician: Referring Adara Kittle: Karl Ito Treating Larsen Dungan/Extender: Yaakov Guthrie in Treatment: 19 Vital Signs Time Taken: 14:15 Temperature (F): 98.3 Height (in): 65 Pulse (bpm): 89 Weight (lbs): 180 Respiratory Rate (breaths/min): 18 Body Mass Index (BMI): 30 Blood Pressure (mmHg): 117/71 Reference Range: 80 - 120 mg / dl Electronic Signature(s) Signed: 12/17/2021 2:38:11 PM By: Carlene Coria RN Entered By: Carlene Coria on 12/17/2021 14:15:51
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
## 2021-12-19 ENCOUNTER — Other Ambulatory Visit: Payer: Self-pay

## 2021-12-19 DIAGNOSIS — Z87891 Personal history of nicotine dependence: Secondary | ICD-10-CM

## 2021-12-19 DIAGNOSIS — F17210 Nicotine dependence, cigarettes, uncomplicated: Secondary | ICD-10-CM

## 2021-12-20 DIAGNOSIS — G4733 Obstructive sleep apnea (adult) (pediatric): Secondary | ICD-10-CM | POA: Diagnosis not present

## 2021-12-24 ENCOUNTER — Other Ambulatory Visit: Payer: Self-pay

## 2021-12-24 ENCOUNTER — Encounter (HOSPITAL_BASED_OUTPATIENT_CLINIC_OR_DEPARTMENT_OTHER): Payer: Medicare Other | Admitting: Internal Medicine

## 2021-12-24 DIAGNOSIS — I5022 Chronic systolic (congestive) heart failure: Secondary | ICD-10-CM | POA: Diagnosis not present

## 2021-12-24 DIAGNOSIS — L89513 Pressure ulcer of right ankle, stage 3: Secondary | ICD-10-CM | POA: Diagnosis not present

## 2021-12-24 DIAGNOSIS — I25119 Atherosclerotic heart disease of native coronary artery with unspecified angina pectoris: Secondary | ICD-10-CM | POA: Diagnosis not present

## 2021-12-24 DIAGNOSIS — I110 Hypertensive heart disease with heart failure: Secondary | ICD-10-CM | POA: Diagnosis not present

## 2021-12-24 DIAGNOSIS — L97522 Non-pressure chronic ulcer of other part of left foot with fat layer exposed: Secondary | ICD-10-CM | POA: Diagnosis not present

## 2021-12-24 DIAGNOSIS — Z89422 Acquired absence of other left toe(s): Secondary | ICD-10-CM | POA: Diagnosis not present

## 2021-12-24 DIAGNOSIS — Z951 Presence of aortocoronary bypass graft: Secondary | ICD-10-CM | POA: Diagnosis not present

## 2021-12-24 DIAGNOSIS — F17210 Nicotine dependence, cigarettes, uncomplicated: Secondary | ICD-10-CM | POA: Diagnosis not present

## 2021-12-24 DIAGNOSIS — E1165 Type 2 diabetes mellitus with hyperglycemia: Secondary | ICD-10-CM | POA: Diagnosis not present

## 2021-12-24 DIAGNOSIS — E11622 Type 2 diabetes mellitus with other skin ulcer: Secondary | ICD-10-CM | POA: Diagnosis not present

## 2021-12-26 DIAGNOSIS — E113412 Type 2 diabetes mellitus with severe nonproliferative diabetic retinopathy with macular edema, left eye: Secondary | ICD-10-CM | POA: Diagnosis not present

## 2021-12-29 ENCOUNTER — Encounter: Payer: Self-pay | Admitting: Acute Care

## 2021-12-29 ENCOUNTER — Other Ambulatory Visit: Payer: Self-pay

## 2021-12-29 ENCOUNTER — Ambulatory Visit (INDEPENDENT_AMBULATORY_CARE_PROVIDER_SITE_OTHER): Payer: Medicare Other | Admitting: Acute Care

## 2021-12-29 DIAGNOSIS — F17210 Nicotine dependence, cigarettes, uncomplicated: Secondary | ICD-10-CM | POA: Diagnosis not present

## 2021-12-29 NOTE — Progress Notes (Signed)
[name], [name] ([id number]) Visit Report for 12/24/2021 Arrival Information Details Patient Name: [name], HUMM. Date of Service: 12/24/2021 2:00 PM Medical Record Number: [id number] Patient Account Number: [id number] Date of Birth/Sex: 09-03-1961 (61 y.o. M) Treating RN: Carlene Coria Primary Care Candra Wegner: Karl Ito Other Clinician: Referring Eliezer Khawaja: Karl Ito Treating Krystal Teachey/Extender: Yaakov Guthrie in Treatment: 20 Visit Information History Since Last Visit All ordered tests and consults were completed: No Patient Arrived: Ambulatory Added or deleted any medications: No Arrival Time: 14:04 Any new allergies or adverse reactions: No Accompanied By: self Had a fall or experienced change in No Transfer Assistance: None activities of daily living that may affect Patient Identification Verified: Yes risk of falls: Secondary Verification Process Completed: Yes Signs or symptoms of abuse/neglect since last visito No Patient Requires Transmission-Based Precautions: No Hospitalized since last visit: No Patient Has Alerts: No Implantable device outside of the clinic excluding No cellular tissue based products placed in the center since last visit: Has Dressing in Place as Prescribed: Yes Pain Present Now: No Electronic Signature(s) Signed: 12/29/2021 4:21:53 PM By: Carlene Coria RN Entered By: Carlene Coria on 12/24/2021 14:09:22 [name] ([id number]) -------------------------------------------------------------------------------- Encounter Discharge Information Details Patient Name: [name], [name]. Date of Service: 12/24/2021 2:00 PM Medical Record Number: [id number] Patient Account Number: [id number] Date of Birth/Sex: 18-Nov-1960 (61 y.o. M) Treating RN: Carlene Coria Primary Care Jentry Mcqueary: Karl Ito Other Clinician: Referring Sugar Vanzandt: Karl Ito Treating Shubh Chiara/Extender: Yaakov Guthrie in Treatment: 20 Encounter Discharge Information Items  Post Procedure Vitals Discharge Condition: Stable Temperature (F): 98.5 Ambulatory Status: Ambulatory Pulse (bpm): 74 Discharge Destination: Home Respiratory Rate (breaths/min): 18 Transportation: Private Auto Blood Pressure (mmHg): 134/78 Accompanied By: self Schedule Follow-up Appointment: Yes Clinical Summary of Care: Patient Declined Electronic Signature(s) Signed: 12/29/2021 4:21:53 PM By: Carlene Coria RN Entered By: Carlene Coria on 12/24/2021 14:38:16 [name] ([id number]) -------------------------------------------------------------------------------- Lower Extremity Assessment Details Patient Name: [name], [name]. Date of Service: 12/24/2021 2:00 PM Medical Record Number: [id number] Patient Account Number: [id number] Date of Birth/Sex: Jan 14, 1961 (61 y.o. M) Treating RN: Carlene Coria Primary Care Serafino Burciaga: Karl Ito Other Clinician: Referring Jontez Redfield: Karl Ito Treating Vincenza Dail/Extender: Yaakov Guthrie in Treatment: 20 Edema Assessment Assessed: [Left: No] [Right: No] Edema: [Left: Ye] [Right: s] Calf Left: Right: Point of Measurement: 34 cm From Medial Instep 30 cm Ankle Left: Right: Point of Measurement: 10 cm From Medial Instep 20 cm Vascular Assessment Pulses: Dorsalis Pedis Palpable: [Right:Yes] Electronic Signature(s) Signed: 12/29/2021 4:21:53 PM By: Carlene Coria RN Entered By: Carlene Coria on 12/24/2021 14:14:46 [name] ([id number]) -------------------------------------------------------------------------------- Multi Wound Chart Details Patient Name: [name]. Date of Service: 12/24/2021 2:00 PM Medical Record Number: [id number] Patient Account Number: [id number] Date of Birth/Sex: 1961/03/09 (61 y.o. M) Treating RN: Carlene Coria Primary Care Amanat Hackel: Karl Ito Other Clinician: Referring Breeonna Mone: Karl Ito Treating Keondre Markson/Extender: Yaakov Guthrie in Treatment: 20 Vital Signs Height(in):  65 Pulse(bpm): 74 Weight(lbs): 180 Blood Pressure(mmHg): 134/78 Body Mass Index(BMI): 30 Temperature(F): 98.5 Respiratory Rate(breaths/min): 18 Photos: [N/A:N/A] Wound Location: Right Ankle N/A N/A Wounding Event: Gradually Appeared N/A N/A Primary Etiology: Pressure Ulcer N/A N/A Comorbid History: Coronary Artery Disease, N/A N/A Hypertension, Myocardial Infarction, Type II Diabetes Date Acquired: 11/30/2021 N/A N/A Weeks of Treatment: 3 N/A N/A Wound Status: Open N/A N/A Wound Recurrence: No N/A N/A Measurements L x W x D (cm) 0.3x0.4x0.2 N/A N/A Area (cm) : 0.094 N/A N/A Volume (cm) : 0.019 N/A N/A % Reduction in Area: 95.40% N/A N/A %  Reduction in Volume: 95.30% N/A N/A Classification: Category/Stage III N/A N/A Exudate Amount: Medium N/A N/A Exudate Type: Serosanguineous N/A N/A Exudate Color: red, brown N/A N/A Granulation Amount: None Present (0%) N/A N/A Necrotic Amount: Large (67-100%) N/A N/A Necrotic Tissue: Eschar, Adherent Slough N/A N/A Exposed Structures: Fascia: No N/A N/A Fat Layer (Subcutaneous Tissue): No Tendon: No Muscle: No Joint: No Bone: No Epithelialization: None N/A N/A Treatment Notes Electronic Signature(s) Signed: 12/29/2021 4:21:53 PM By: Carlene Coria RN Entered By: Carlene Coria on 12/24/2021 14:15:07 [name] ([id number]) -------------------------------------------------------------------------------- Multi-Disciplinary Care Plan Details Patient Name: [name], [name]. Date of Service: 12/24/2021 2:00 PM Medical Record Number: [id number] Patient Account Number: [id number] Date of Birth/Sex: 1961/07/13 (61 y.o. M) Treating RN: Carlene Coria Primary Care Sharlynn Seckinger: Karl Ito Other Clinician: Referring Greco Gastelum: Karl Ito Treating Jamicah Anstead/Extender: Yaakov Guthrie in Treatment: 20 Active Inactive Electronic Signature(s) Signed: 12/29/2021 4:21:53 PM By: Carlene Coria RN Entered By: Carlene Coria on 12/24/2021  14:14:50 [name] ([id number]) -------------------------------------------------------------------------------- Pain Assessment Details Patient Name: [name], [name]. Date of Service: 12/24/2021 2:00 PM Medical Record Number: [id number] Patient Account Number: [id number] Date of Birth/Sex: July 24, 1961 (61 y.o. M) Treating RN: Carlene Coria Primary Care Katleen Carraway: Karl Ito Other Clinician: Referring Jemimah Cressy: Karl Ito Treating Yasira Engelson/Extender: Yaakov Guthrie in Treatment: 20 Active Problems Location of Pain Severity and Description of Pain Patient Has Paino No Site Locations Pain Management and Medication Current Pain Management: Electronic Signature(s) Signed: 12/29/2021 4:21:53 PM By: Carlene Coria RN Entered By: Carlene Coria on 12/24/2021 14:09:50 [name] ([id number]) -------------------------------------------------------------------------------- Wound Assessment Details Patient Name: [name], [name]. Date of Service: 12/24/2021 2:00 PM Medical Record Number: [id number] Patient Account Number: [id number] Date of Birth/Sex: [birth year] (61 y.o. M) Treating RN: Carlene Coria Primary Care Elisabetta Mishra: Karl Ito Other Clinician: Referring Savoy [place]: Karl Ito Treating Jenessa Gillingham/Extender: Yaakov Guthrie in Treatment: 20 Wound Status Wound Number: 7 Primary Pressure Ulcer Etiology: Wound Location: Right Ankle Wound Status: Open Wounding Event: Gradually Appeared Comorbid Coronary Artery Disease, Hypertension, Myocardial Date Acquired: 11/30/2021 History: Infarction, Type II Diabetes Weeks Of Treatment: 3 Clustered Wound: No Photos Wound Measurements Length: (cm) 0.3 % Reduct Width: (cm) 0.4 % Reduct Depth: (cm) 0.2 Epitheli Area: (cm) 0.094 Tunneli Volume: (cm) 0.019 Undermi ion in Area: 95.4% ion in Volume: 95.3% alization: None ng: No ning: No Wound Description Classification: Category/Stage III Foul Od Exudate Amount: Medium  Slough/ Exudate Type: Serosanguineous Exudate Color: red, brown or After Cleansing: No Fibrino Yes Wound Bed Granulation Amount: None Present (0%) Exposed Structure Necrotic Amount: Large (67-100%) Fascia Exposed: No Necrotic Quality: Eschar, Adherent Slough Fat Layer (Subcutaneous Tissue) Exposed: No Tendon Exposed: No Muscle Exposed: No Joint Exposed: No Bone Exposed: No Treatment Notes Wound #7 (Ankle) Wound Laterality: Right Cleanser Peri-Wound Care Topical Primary Dressing [name], [name] ([id number]) Secondary Dressing Coverlet Latex-Free Fabric Adhesive Dressings Discharge Instruction: 1.5 x 2 Gauze Discharge Instruction: As directed: dry, moistened with saline or moistened with Dakins Solution Secured With Compression Wrap Compression Stockings Add-Ons Electronic Signature(s) Signed: 12/29/2021 4:21:53 PM By: Carlene Coria RN Entered By: Carlene Coria on 12/24/2021 14:13:32 [name] ([id number]) -------------------------------------------------------------------------------- Vitals Details Patient Name: [name]. Date of Service: 12/24/2021 2:00 PM Medical Record Number: [id number] Patient Account Number: [id number] Date of Birth/Sex: 05/30/1961 (61 y.o. M) Treating RN: Carlene Coria Primary Care Giannie Soliday: Karl Ito Other Clinician: Referring Kalaysia Demonbreun: Karl Ito Treating Herberth Deharo/Extender: Yaakov Guthrie in Treatment: 20 Vital Signs Time Taken: 14:09 Temperature (F): 98.5 Height (in): 65  Pulse (bpm): 74 Weight (lbs): 180 Respiratory Rate (breaths/min): 18 Body Mass Index (BMI): 30 Blood Pressure (mmHg): 134/78 Reference Range: 80 - 120 mg / dl Electronic Signature(s) Signed: 12/29/2021 4:21:53 PM By: Carlene Coria RN Entered By: Carlene Coria on 12/24/2021 14:09:40
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
## 2021-12-29 NOTE — Progress Notes (Signed)
Virtual Visit via Telephone Note ? ?I connected with [name] on 09/02/21 at  2:00 PM EST by telephone and verified that I am speaking with the correct person using two identifiers. ? ?Location: ?Patient: Home   ?Provider: Working from home ?  ?I discussed the limitations, risks, security and privacy concerns of performing an evaluation and management service by telephone and the availability of in person appointments. I also discussed with the patient that there may be a patient responsible charge related to this service. The patient expressed understanding and agreed to proceed. ? ?Shared Decision Making Visit Lung Cancer Screening Program ?[phone]) ? ? ?Eligibility: ?Age 61 y.o. ?Pack Years Smoking History Calculation 63 ?(# packs/per year x # years smoked) ?Recent History of coughing up blood  no ?Unexplained weight loss? no ?( >Than 15 pounds within the last 6 months ) ?Prior History Lung / other cancer no ?(Diagnosis within the last 5 years already requiring surveillance chest CT Scans). ?Smoking Status Current Smoker ?Former Smokers: Years since quit: NA ? Quit Date: NA ? ?Visit Components: ?Discussion included one or more decision making aids. yes ?Discussion included risk/benefits of screening. yes ?Discussion included potential follow up diagnostic testing for abnormal scans. yes ?Discussion included meaning and risk of over diagnosis. yes ?Discussion included meaning and risk of False Positives. yes ?Discussion included meaning of total radiation exposure. yes ? ?Counseling Included: ?Importance of adherence to annual lung cancer LDCT screening. yes ?Impact of comorbidities on ability to participate in the program. yes ?Ability and willingness to under diagnostic treatment. yes ? ?Smoking Cessation Counseling: ?Current Smokers:  ?Discussed importance of smoking cessation. yes ?Information about tobacco cessation classes and interventions provided to patient. yes ?Patient provided with "ticket" for  LDCT Scan. yes ?Symptomatic Patient. yes ? Counseling(Intermediate counseling: > three minutes) [id number] ?Diagnosis Code: Tobacco Use Z72.0 ?Asymptomatic Patient no ? Counseling NA ?Former Smokers:  ?Discussed the importance of maintaining cigarette abstinence. yes ?Diagnosis Code: Personal History of Nicotine Dependence. E52.778 ?Information about tobacco cessation classes and interventions provided to patient. Yes ?Patient provided with "ticket" for LDCT Scan. yes ?Written Order for Lung Cancer Screening with LDCT placed in Epic. Yes ?(CT Chest Lung Cancer Screening Low Dose W/O CM) [id number] ?Z12.2-Screening of respiratory organs ?Z87.891-Personal history of nicotine dependence ? ? ?I spent 25 minutes of face to face time with him discussing the risks and benefits of lung cancer screening. We viewed a power point together that explained in detail the above noted topics. We took the time to pause the power point at intervals to allow for questions to be asked and answered to ensure understanding. We discussed that he had taken the single most powerful action possible to decrease his risk of developing lung cancer when he quit smoking. I counseled he to remain smoke free, and to contact me if he ever had the desire to smoke again so that I can provide resources and tools to help support the effort to remain smoke free. We discussed the time and location of the scan, and that either  Doroteo Glassman RN or I will call with the results within  24-48 hours of receiving them. He has my card and contact information in the event he needs to speak with me, in addition to a copy of the power point we reviewed as a resource. He verbalized understanding of all of the above and had no further questions upon leaving the office.  ? ? ? ?I explained to the patient that there has  been a high incidence of coronary artery disease noted on these exams. I explained that this is a non-gated exam therefore degree or severity cannot be  determined. This patient is on statin therapy. I have asked the patient to follow-up with their PCP regarding any incidental finding of coronary artery disease and management with diet or medication as they feel is clinically indicated. The patient verbalized understanding of the above and had no further questions. ? ? ?I spent 3 minutes counseling on smoking cessation and the health risks of continued tobacco abuse  ? ? ?Suhaas Agena D. Harris, NP-C ?[facility] ?Personal contact information can be found on Amion  ?12/29/2021, 11:17 AM ? ? ? ? ? ? ? ? ? ?

## 2021-12-29 NOTE — Patient Instructions (Signed)
Thank you for participating in the [facility] Screening Program. °It was our pleasure to meet you today. °We will call you with the results of your scan within the next few days. °Your scan will be assigned a Lung RADS category score by the physicians reading the scans.  °This Lung RADS score determines follow up scanning.  °See below for description of categories, and follow up screening recommendations. °We will be in touch to schedule your follow up screening annually or based on recommendations of our providers. °We will fax a copy of your scan results to your Primary Care Physician, or the physician who referred you to the program, to ensure they have the results. °Please call the office if you have any questions or concerns regarding your scanning experience or results.  °Our office number is [phone]. °Please speak with Denise Phelps, RN. She is our Lung Cancer Screening RN. °If she is unavailable when you call, please have the office staff send her a message. She will return your call at her earliest convenience. °Remember, if your scan is normal, we will scan you annually as long as you continue to meet the criteria for the program. (Age 55-77, Current smoker or smoker who has quit within the last 15 years). °If you are a smoker, remember, quitting is the single most powerful action that you can take to decrease your risk of lung cancer and other pulmonary, breathing related problems. °We know quitting is hard, and we are here to help.  °Please let us know if there is anything we can do to help you meet your goal of quitting. °If you are a former smoker, congratulations. We are proud of you! Remain smoke free! °Remember you can refer friends or family members through the number above.  °We will screen them to make sure they meet criteria for the program. °Thank you for helping us take better care of you by participating in Lung Screening. ° °You can receive free nicotine replacement therapy  ( patches, gum or mints) by calling 1-800-QUIT NOW. Please call so we can get you on the path to becoming  a non-smoker. I know it is hard, but you can do this! ° °Lung RADS Categories: ° °Lung RADS 1: no nodules or definitely non-concerning nodules.  °Recommendation is for a repeat annual scan in 12 months. ° °Lung RADS 2:  nodules that are non-concerning in appearance and behavior with a very low likelihood of becoming an active cancer. °Recommendation is for a repeat annual scan in 12 months. ° °Lung RADS 3: nodules that are probably non-concerning , includes nodules with a low likelihood of becoming an active cancer.  Recommendation is for a 6-month repeat screening scan. Often noted after an upper respiratory illness. We will be in touch to make sure you have no questions, and to schedule your 6-month scan. ° °Lung RADS 4 A: nodules with concerning findings, recommendation is most often for a follow up scan in 3 months or additional testing based on our provider's assessment of the scan. We will be in touch to make sure you have no questions and to schedule the recommended 3 month follow up scan. ° °Lung RADS 4 B:  indicates findings that are concerning. We will be in touch with you to schedule additional diagnostic testing based on our provider's  assessment of the scan. ° °Hypnosis for smoking cessation  °[employer] °[phone] ° °Acupuncture for smoking cessation  °[facility] °[phone]  °

## 2021-12-31 ENCOUNTER — Encounter (HOSPITAL_BASED_OUTPATIENT_CLINIC_OR_DEPARTMENT_OTHER): Payer: Medicare Other | Admitting: Internal Medicine

## 2021-12-31 ENCOUNTER — Other Ambulatory Visit: Payer: Self-pay

## 2021-12-31 DIAGNOSIS — I110 Hypertensive heart disease with heart failure: Secondary | ICD-10-CM | POA: Diagnosis not present

## 2021-12-31 DIAGNOSIS — Z89422 Acquired absence of other left toe(s): Secondary | ICD-10-CM | POA: Diagnosis not present

## 2021-12-31 DIAGNOSIS — L89513 Pressure ulcer of right ankle, stage 3: Secondary | ICD-10-CM

## 2021-12-31 DIAGNOSIS — F17210 Nicotine dependence, cigarettes, uncomplicated: Secondary | ICD-10-CM | POA: Diagnosis not present

## 2021-12-31 DIAGNOSIS — Z951 Presence of aortocoronary bypass graft: Secondary | ICD-10-CM | POA: Diagnosis not present

## 2021-12-31 DIAGNOSIS — I25119 Atherosclerotic heart disease of native coronary artery with unspecified angina pectoris: Secondary | ICD-10-CM | POA: Diagnosis not present

## 2021-12-31 DIAGNOSIS — E1165 Type 2 diabetes mellitus with hyperglycemia: Secondary | ICD-10-CM | POA: Diagnosis not present

## 2021-12-31 DIAGNOSIS — I5022 Chronic systolic (congestive) heart failure: Secondary | ICD-10-CM | POA: Diagnosis not present

## 2021-12-31 DIAGNOSIS — L97522 Non-pressure chronic ulcer of other part of left foot with fat layer exposed: Secondary | ICD-10-CM | POA: Diagnosis not present

## 2021-12-31 DIAGNOSIS — E11622 Type 2 diabetes mellitus with other skin ulcer: Secondary | ICD-10-CM | POA: Diagnosis not present

## 2022-01-01 ENCOUNTER — Ambulatory Visit
Admission: RE | Admit: 2022-01-01 | Discharge: 2022-01-01 | Disposition: A | Discharge status: Home / Self Care | Payer: Medicare Other | Source: Ambulatory Visit | Attending: Acute Care | Admitting: Acute Care

## 2022-01-01 DIAGNOSIS — Z87891 Personal history of nicotine dependence: Secondary | ICD-10-CM | POA: Diagnosis not present

## 2022-01-01 DIAGNOSIS — F17210 Nicotine dependence, cigarettes, uncomplicated: Secondary | ICD-10-CM | POA: Insufficient documentation

## 2022-01-02 NOTE — Progress Notes (Signed)
SAMSON, RALPH ([id number]) ?Visit Report for 12/31/2021 ?Arrival Information Details ?Patient Name: [name], [name] ?Date of Service: 12/31/2021 1:15 PM ?Medical Record Number: [id number] ?Patient Account Number: [id number] ?Date of Birth/Sex: [birth year] (61 y.o. M) ?Treating RN: Carlene Coria ?Primary Care Annaka Cleaver: Karl Ito Other Clinician: ?Referring Timber Marshman: Karl Ito ?Treating Sophiarose Eades/Extender: Kalman Shan ?Weeks in Treatment: 21 ?Visit Information History Since Last Visit ?All ordered tests and consults were completed: No ?Patient Arrived: Ambulatory ?Added or deleted any medications: No ?Arrival Time: 13:19 ?Any new allergies or adverse reactions: No ?Accompanied By: self ?Had a fall or experienced change in No ?Transfer Assistance: None ?activities of daily living that may affect ?Patient Identification Verified: Yes ?risk of falls: ?Secondary Verification Process Completed: Yes ?Signs or symptoms of abuse/neglect since last visito No ?Patient Requires Transmission-Based Precautions: No ?Hospitalized since last visit: No ?Patient Has Alerts: No ?Implantable device outside of the clinic excluding No ?cellular tissue based products placed in the center ?since last visit: ?Has Dressing in Place as Prescribed: Yes ?Pain Present Now: No ?Electronic Signature(s) ?Signed: 01/02/2022 4:20:50 PM By: Carlene Coria RN ?Entered By: Carlene Coria on 12/31/2021 13:21:58 ?KEYONTAE, HUCKEBY ([id number]) ?-------------------------------------------------------------------------------- ?Clinic Level of Care Assessment Details ?Patient Name: DEMITRI, KUCINSKI ?Date of Service: 12/31/2021 1:15 PM ?Medical Record Number: [id number] ?Patient Account Number: [id number] ?Date of Birth/Sex: July 16, 1961 (61 y.o. M) ?Treating RN: Carlene Coria ?Primary Care Rasheed Welty: Karl Ito Other Clinician: ?Referring Vershawn Westrup: Karl Ito ?Treating Tashiana Lamarca/Extender: Kalman Shan ?Weeks in Treatment: 21 ?Clinic Level of Care Assessment  Items ?TOOL 1 Quantity Score ?'[]'$  - Use when EandM and Procedure is performed on INITIAL visit 0 ?ASSESSMENTS - Nursing Assessment / Reassessment ?'[]'$  - General Physical Exam (combine w/ comprehensive assessment (listed just below) when performed on new ?0 ?pt. evals) ?'[]'$  - 0 ?Comprehensive Assessment (HX, ROS, Risk Assessments, Wounds Hx, etc.) ?ASSESSMENTS - Wound and Skin Assessment / Reassessment ?'[]'$  - Dermatologic / Skin Assessment (not related to wound area) 0 ?ASSESSMENTS - Ostomy and/or Continence Assessment and Care ?'[]'$  - Incontinence Assessment and Management 0 ?'[]'$  - 0 ?Ostomy Care Assessment and Management (repouching, etc.) ?PROCESS - Coordination of Care ?'[]'$  - Simple Patient / Family Education for ongoing care 0 ?'[]'$  - 0 ?Complex (extensive) Patient / Family Education for ongoing care ?'[]'$  - 0 ?Staff obtains Consents, Records, Test Results / Process Orders ?'[]'$  - 0 ?Staff telephones HHA, Nursing Homes / Clarify orders / etc ?'[]'$  - 0 ?Routine Transfer to another Facility (non-emergent condition) ?'[]'$  - 0 ?Routine Hospital Admission (non-emergent condition) ?'[]'$  - 0 ?New Admissions / Biomedical engineer / Ordering NPWT, Apligraf, etc. ?'[]'$  - 0 ?Emergency Hospital Admission (emergent condition) ?PROCESS - Special Needs ?'[]'$  - Pediatric / Minor Patient Management 0 ?'[]'$  - 0 ?Isolation Patient Management ?'[]'$  - 0 ?Hearing / Language / Visual special needs ?'[]'$  - 0 ?Assessment of Community assistance (transportation, D/C planning, etc.) ?'[]'$  - 0 ?Additional assistance / Altered mentation ?'[]'$  - 0 ?Support Surface(s) Assessment (bed, cushion, seat, etc.) ?INTERVENTIONS - Miscellaneous ?'[]'$  - External ear exam 0 ?'[]'$  - 0 ?Patient Transfer (multiple staff / Civil Service fast streamer / Similar devices) ?'[]'$  - 0 ?Simple Staple / Suture removal (25 or less) ?'[]'$  - 0 ?Complex Staple / Suture removal (26 or more) ?'[]'$  - 0 ?Hypo/Hyperglycemic Management (do not check if billed separately) ?'[]'$  - 0 ?Ankle / Brachial Index (ABI) - do not check if  billed separately ?Has the patient been seen at the hospital within the last three years: Yes ?Total Score: 0 ?Level  Of Care: ____ ?CLAYBORN, MILNES ([id number]) ?Electronic Signature(s) ?Signed: 01/02/2022 4:20:50 PM By: Carlene Coria RN ?Entered By: Carlene Coria on 12/31/2021 13:38:52 ?KEITHEN, CAPO ([id number]) ?-------------------------------------------------------------------------------- ?Encounter Discharge Information Details ?Patient Name: SLADE, PIERPOINT ?Date of Service: 12/31/2021 1:15 PM ?Medical Record Number: [id number] ?Patient Account Number: [id number] ?Date of Birth/Sex: [birth year] (61 y.o. M) ?Treating RN: Carlene Coria ?Primary Care Chaseton Yepiz: Karl Ito Other Clinician: ?Referring Carmel Garfield: Karl Ito ?Treating Kaysee Hergert/Extender: Kalman Shan ?Weeks in Treatment: 21 ?Encounter Discharge Information Items Post Procedure Vitals ?Discharge Condition: Stable ?Temperature (?F): 97.8 ?Ambulatory Status: Ambulatory ?Pulse (bpm): 76 ?Discharge Destination: Home ?Respiratory Rate (breaths/min): 18 ?Transportation: Private Auto ?Blood Pressure (mmHg): 118/75 ?Accompanied By: self ?Schedule Follow-up Appointment: Yes ?Clinical Summary of Care: Patient Declined ?Electronic Signature(s) ?Signed: 01/02/2022 4:20:50 PM By: Carlene Coria RN ?Entered By: Carlene Coria on 12/31/2021 13:44:44 ?KATRINA, BROSH ([id number]) ?-------------------------------------------------------------------------------- ?Lower Extremity Assessment Details ?Patient Name: TYQUON, NEAR ?Date of Service: 12/31/2021 1:15 PM ?Medical Record Number: [id number] ?Patient Account Number: [id number] ?Date of Birth/Sex: [birth year] (61 y.o. M) ?Treating RN: Carlene Coria ?Primary Care Coralee Edberg: Karl Ito Other Clinician: ?Referring Krisha Beegle: Karl Ito ?Treating Layci Stenglein/Extender: Kalman Shan ?Weeks in Treatment: 21 ?Edema Assessment ?Assessed: [Left: No] [Right: No] ?Edema: [Left: Ye] [Right: s] ?Calf ?Left: Right: ?Point of  Measurement: 34 cm From Medial Instep 30 cm ?Ankle ?Left: Right: ?Point of Measurement: 10 cm From Medial Instep 20 cm ?Vascular Assessment ?Pulses: ?Dorsalis Pedis ?Palpable: [Right:Yes] ?Electronic Signature(s) ?Signed: 01/02/2022 4:20:50 PM By: Carlene Coria RN ?Entered By: Carlene Coria on 12/31/2021 13:29:49 ?TRENTEN, WATCHMAN ([id number]) ?-------------------------------------------------------------------------------- ?Multi Wound Chart Details ?Patient Name: SHLOMIE, ROMIG ?Date of Service: 12/31/2021 1:15 PM ?Medical Record Number: [id number] ?Patient Account Number: [id number] ?Date of Birth/Sex: Aug 15, 1961 (61 y.o. M) ?Treating RN: Carlene Coria ?Primary Care Stanley Lyness: Karl Ito Other Clinician: ?Referring Sanari Offner: Karl Ito ?Treating Mataya Kilduff/Extender: Kalman Shan ?Weeks in Treatment: 21 ?Vital Signs ?Height(in): 76 ?Pulse(bpm): 76 ?Weight(lbs): 180 ?Blood Pressure(mmHg): 118/75 ?Body Mass Index(BMI): 21.9 ?Temperature(??F): 97.8 ?Respiratory Rate(breaths/min): 18 ?Photos: [N/A:N/A] ?Wound Location: Right Ankle N/A N/A ?Wounding Event: Gradually Appeared N/A N/A ?Primary Etiology: Pressure Ulcer N/A N/A ?Comorbid History: Coronary Artery Disease, N/A N/A ?Hypertension, Myocardial Infarction, ?Type II Diabetes ?Date Acquired: 11/30/2021 N/A N/A ?Weeks of Treatment: 4 N/A N/A ?Wound Status: Open N/A N/A ?Wound Recurrence: No N/A N/A ?Measurements L x W x D (cm) 0.3x1x0.2 N/A N/A ?Area (cm?) : 0.236 N/A N/A ?Volume (cm?) : 0.047 N/A N/A ?% Reduction in Area: 88.40% N/A N/A ?% Reduction in Volume: 88.50% N/A N/A ?Classification: Category/Stage III N/A N/A ?Exudate Amount: Medium N/A N/A ?Exudate Type: Serosanguineous N/A N/A ?Exudate Color: red, brown N/A N/A ?Granulation Amount: None Present (0%) N/A N/A ?Necrotic Amount: Large (67-100%) N/A N/A ?Necrotic Tissue: Eschar, Adherent Slough N/A N/A ?Exposed Structures: ?Fascia: No N/A N/A ?Fat Layer (Subcutaneous Tissue): ?No ?Tendon: No ?Muscle: No ?Joint:  No ?Bone: No ?Epithelialization: None N/A N/A ?Treatment Notes ?Electronic Signature(s) ?Signed: 01/02/2022 4:20:50 PM By: Carlene Coria RN ?Entered By: Carlene Coria on 12/31/2021 13:30:09 ?ORESTE, MAJEED ([id number]
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
## 2022-01-05 ENCOUNTER — Other Ambulatory Visit: Payer: Self-pay

## 2022-01-05 DIAGNOSIS — Z87891 Personal history of nicotine dependence: Secondary | ICD-10-CM

## 2022-01-05 DIAGNOSIS — F17210 Nicotine dependence, cigarettes, uncomplicated: Secondary | ICD-10-CM

## 2022-01-07 ENCOUNTER — Encounter (HOSPITAL_BASED_OUTPATIENT_CLINIC_OR_DEPARTMENT_OTHER): Payer: Medicare Other | Admitting: Internal Medicine

## 2022-01-07 ENCOUNTER — Other Ambulatory Visit: Payer: Self-pay

## 2022-01-07 DIAGNOSIS — L89513 Pressure ulcer of right ankle, stage 3: Secondary | ICD-10-CM | POA: Diagnosis not present

## 2022-01-07 DIAGNOSIS — I25119 Atherosclerotic heart disease of native coronary artery with unspecified angina pectoris: Secondary | ICD-10-CM | POA: Diagnosis not present

## 2022-01-07 DIAGNOSIS — I110 Hypertensive heart disease with heart failure: Secondary | ICD-10-CM | POA: Diagnosis not present

## 2022-01-07 DIAGNOSIS — L97522 Non-pressure chronic ulcer of other part of left foot with fat layer exposed: Secondary | ICD-10-CM | POA: Diagnosis not present

## 2022-01-07 DIAGNOSIS — Z951 Presence of aortocoronary bypass graft: Secondary | ICD-10-CM | POA: Diagnosis not present

## 2022-01-07 DIAGNOSIS — Z89422 Acquired absence of other left toe(s): Secondary | ICD-10-CM | POA: Diagnosis not present

## 2022-01-07 DIAGNOSIS — F17210 Nicotine dependence, cigarettes, uncomplicated: Secondary | ICD-10-CM | POA: Diagnosis not present

## 2022-01-07 DIAGNOSIS — E11622 Type 2 diabetes mellitus with other skin ulcer: Secondary | ICD-10-CM

## 2022-01-07 DIAGNOSIS — E1165 Type 2 diabetes mellitus with hyperglycemia: Secondary | ICD-10-CM | POA: Diagnosis not present

## 2022-01-07 DIAGNOSIS — I5022 Chronic systolic (congestive) heart failure: Secondary | ICD-10-CM | POA: Diagnosis not present

## 2022-01-07 NOTE — Progress Notes (Signed)
ESTUS, KRAKOWSKI ([id number]) ?Visit Report for 01/07/2022 ?Arrival Information Details ?Patient Name: [name], [name] ?Date of Service: 01/07/2022 11:15 AM ?Medical Record Number: [id number] ?Patient Account Number: [id number] ?Date of Birth/Sex: 1961/06/16 (61 y.o. M) ?Treating RN: Levora Dredge ?Primary Care Jewett Mcgann: Karl Ito Other Clinician: ?Referring Ines Rebel: Karl Ito ?Treating Magan Winnett/Extender: Kalman Shan ?Weeks in Treatment: 22 ?Visit Information History Since Last Visit ?Added or deleted any medications: No ?Patient Arrived: Ambulatory ?Any new allergies or adverse reactions: No ?Arrival Time: 11:19 ?Had a fall or experienced change in No ?Accompanied By: daughter ?activities of daily living that may affect ?Transfer Assistance: None ?risk of falls: ?Patient Identification Verified: Yes ?Hospitalized since last visit: No ?Secondary Verification Process Completed: Yes ?Has Dressing in Place as Prescribed: Yes ?Patient Requires Transmission-Based Precautions: No ?Pain Present Now: No ?Patient Has Alerts: No ?Electronic Signature(s) ?Signed: 01/07/2022 3:51:21 PM By: Levora Dredge ?Entered By: Levora Dredge on 01/07/2022 11:19:57 ?LOGHAN, KURTZMAN ([id number]) ?-------------------------------------------------------------------------------- ?Clinic Level of Care Assessment Details ?Patient Name: [name] ?Date of Service: 01/07/2022 11:15 AM ?Medical Record Number: [id number] ?Patient Account Number: [id number] ?Date of Birth/Sex: 1961/03/17 (61 y.o. M) ?Treating RN: Levora Dredge ?Primary Care Latandra Loureiro: Karl Ito Other Clinician: ?Referring Tristian Bouska: Karl Ito ?Treating Swayzee Wadley/Extender: Kalman Shan ?Weeks in Treatment: 22 ?Clinic Level of Care Assessment Items ?TOOL 1 Quantity Score ?'[]'$  - Use when EandM and Procedure is performed on INITIAL visit 0 ?ASSESSMENTS - Nursing Assessment / Reassessment ?'[]'$  - General Physical Exam (combine w/ comprehensive assessment (listed just  below) when performed on new ?0 ?pt. evals) ?'[]'$  - 0 ?Comprehensive Assessment (HX, ROS, Risk Assessments, Wounds Hx, etc.) ?ASSESSMENTS - Wound and Skin Assessment / Reassessment ?'[]'$  - Dermatologic / Skin Assessment (not related to wound area) 0 ?ASSESSMENTS - Ostomy and/or Continence Assessment and Care ?'[]'$  - Incontinence Assessment and Management 0 ?'[]'$  - 0 ?Ostomy Care Assessment and Management (repouching, etc.) ?PROCESS - Coordination of Care ?'[]'$  - Simple Patient / Family Education for ongoing care 0 ?'[]'$  - 0 ?Complex (extensive) Patient / Family Education for ongoing care ?'[]'$  - 0 ?Staff obtains Consents, Records, Test Results / Process Orders ?'[]'$  - 0 ?Staff telephones [place], Nursing Homes / Clarify orders / etc ?'[]'$  - 0 ?Routine Transfer to another Facility (non-emergent condition) ?'[]'$  - 0 ?Routine Hospital Admission (non-emergent condition) ?'[]'$  - 0 ?New Admissions / Biomedical engineer / Ordering NPWT, Apligraf, etc. ?'[]'$  - 0 ?Emergency Hospital Admission (emergent condition) ?PROCESS - Special Needs ?'[]'$  - Pediatric / Minor Patient Management 0 ?'[]'$  - 0 ?Isolation Patient Management ?'[]'$  - 0 ?Hearing / Language / Visual special needs ?'[]'$  - 0 ?Assessment of Community assistance (transportation, D/C planning, etc.) ?'[]'$  - 0 ?Additional assistance / Altered mentation ?'[]'$  - 0 ?Support Surface(s) Assessment (bed, cushion, seat, etc.) ?INTERVENTIONS - Miscellaneous ?'[]'$  - External ear exam 0 ?'[]'$  - 0 ?Patient Transfer (multiple staff / Civil Service fast streamer / Similar devices) ?'[]'$  - 0 ?Simple Staple / Suture removal (25 or less) ?'[]'$  - 0 ?Complex Staple / Suture removal (26 or more) ?'[]'$  - 0 ?Hypo/Hyperglycemic Management (do not check if billed separately) ?'[]'$  - 0 ?Ankle / Brachial Index (ABI) - do not check if billed separately ?Has the patient been seen at the hospital within the last three years: Yes ?Total Score: 0 ?Level Of Care: ____ ?ILIJA, MAXIM ([id number]) ?Electronic Signature(s) ?Signed: 01/07/2022 3:51:21 PM By:  Levora Dredge ?Entered By: Levora Dredge on 01/07/2022 11:49:29 ?SELAH, ZELMAN ([id number]) ?-------------------------------------------------------------------------------- ?Encounter Discharge Information Details ?Patient Name: [name], [name] ?  Date of Service: 01/07/2022 11:15 AM ?Medical Record Number: [id number] ?Patient Account Number: [id number] ?Date of Birth/Sex: 07-13-1961 (61 y.o. M) ?Treating RN: Levora Dredge ?Primary Care Lavone Weisel: Karl Ito Other Clinician: ?Referring Xin Klawitter: Karl Ito ?Treating Casper Pagliuca/Extender: Kalman Shan ?Weeks in Treatment: 22 ?Encounter Discharge Information Items Post Procedure Vitals ?Discharge Condition: Stable ?Temperature (?F): 97.4 ?Ambulatory Status: Ambulatory ?Pulse (bpm): 68 ?Discharge Destination: Home ?Respiratory Rate (breaths/min): 18 ?Transportation: Private Auto ?Blood Pressure (mmHg): 158/94 ?Accompanied By: daughter ?Schedule Follow-up Appointment: Yes ?Clinical Summary of Care: ?Electronic Signature(s) ?Signed: 01/07/2022 3:51:21 PM By: Levora Dredge ?Entered By: Levora Dredge on 01/07/2022 11:50:47 ?VALOR, QUAINTANCE ([id number]) ?-------------------------------------------------------------------------------- ?Lower Extremity Assessment Details ?Patient Name: [name] ?Date of Service: 01/07/2022 11:15 AM ?Medical Record Number: [id number] ?Patient Account Number: [id number] ?Date of Birth/Sex: [birth year] (61 y.o. M) ?Treating RN: Levora Dredge ?Primary Care Raford Brissett: Karl Ito Other Clinician: ?Referring Maryagnes Carrasco: Karl Ito ?Treating Leaman Abe/Extender: Kalman Shan ?Weeks in Treatment: 22 ?Edema Assessment ?Assessed: [Left: No] [Right: No] ?Edema: [Left: Ye] [Right: s] ?Calf ?Left: Right: ?Point of Measurement: 34 cm From Medial Instep 33 cm ?Ankle ?Left: Right: ?Point of Measurement: 10 cm From Medial Instep 21 cm ?Vascular Assessment ?Pulses: ?Dorsalis Pedis ?Palpable: [Right:Yes] ?Electronic Signature(s) ?Signed:  01/07/2022 3:51:21 PM By: Levora Dredge ?Entered By: Levora Dredge on 01/07/2022 11:32:30 ?KAID, SEEBERGER ([id number]) ?-------------------------------------------------------------------------------- ?Multi Wound Chart Details ?Patient Name: HERB, BELTRE ?Date of Service: 01/07/2022 11:15 AM ?Medical Record Number: [id number] ?Patient Account Number: [id number] ?Date of Birth/Sex: [birth year] (61 y.o. M) ?Treating RN: Levora Dredge ?Primary Care Xan Sparkman: Karl Ito Other Clinician: ?Referring Travaris Kosh: Karl Ito ?Treating Kenzleigh Sedam/Extender: Kalman Shan ?Weeks in Treatment: 22 ?Vital Signs ?Height(in): 76 ?Pulse(bpm): 68 ?Weight(lbs): 180 ?Blood Pressure(mmHg): 158/94 ?Body Mass Index(BMI): 21.9 ?Temperature(??F): 97.4 ?Respiratory Rate(breaths/min): 18 ?Photos: [N/A:N/A] ?Wound Location: Right Ankle N/A N/A ?Wounding Event: Gradually Appeared N/A N/A ?Primary Etiology: Pressure Ulcer N/A N/A ?Comorbid History: Coronary Artery Disease, N/A N/A ?Hypertension, Myocardial Infarction, ?Type II Diabetes ?Date Acquired: 11/30/2021 N/A N/A ?Weeks of Treatment: 5 N/A N/A ?Wound Status: Open N/A N/A ?Wound Recurrence: No N/A N/A ?Measurements L x W x D (cm) 0.3x2.5x0.2 N/A N/A ?Area (cm?) : 0.589 N/A N/A ?Volume (cm?) : 0.118 N/A N/A ?% Reduction in Area: 71.00% N/A N/A ?% Reduction in Volume: 71.00% N/A N/A ?Classification: Category/Stage III N/A N/A ?Exudate Amount: Medium N/A N/A ?Exudate Type: Serosanguineous N/A N/A ?Exudate Color: red, brown N/A N/A ?Granulation Amount: Small (1-33%) N/A N/A ?Granulation Quality: Pink, Pale N/A N/A ?Necrotic Amount: Large (67-100%) N/A N/A ?Necrotic Tissue: Eschar, Adherent Slough N/A N/A ?Exposed Structures: ?Fat Layer (Subcutaneous Tissue): N/A N/A ?Yes ?Fascia: No ?Tendon: No ?Muscle: No ?Joint: No ?Bone: No ?Epithelialization: None N/A N/A ?Treatment Notes ?Electronic Signature(s) ?Signed: 01/07/2022 3:51:21 PM By: Levora Dredge ?Entered By: Levora Dredge on  01/07/2022 11:35:36 ?AYODELE, SANGALANG ([id number]) ?-------------------------------------------------------------------------------- ?Multi-Disciplinary Care Plan Details ?Patient Name: KLINE, BULTHUIS. ?Date of Servic
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
## 2022-01-08 ENCOUNTER — Ambulatory Visit (INDEPENDENT_AMBULATORY_CARE_PROVIDER_SITE_OTHER): Payer: Medicare Other | Admitting: Gastroenterology

## 2022-01-08 ENCOUNTER — Other Ambulatory Visit: Payer: Self-pay | Admitting: Nurse Practitioner

## 2022-01-08 ENCOUNTER — Encounter: Payer: Self-pay | Admitting: Gastroenterology

## 2022-01-08 VITALS — BP 131/79 | HR 75 | Temp 98.2°F | Ht 65.0 in | Wt 173.0 lb

## 2022-01-08 DIAGNOSIS — Z1211 Encounter for screening for malignant neoplasm of colon: Secondary | ICD-10-CM | POA: Diagnosis not present

## 2022-01-08 DIAGNOSIS — K3184 Gastroparesis: Secondary | ICD-10-CM | POA: Diagnosis not present

## 2022-01-08 MED ORDER — METOCLOPRAMIDE HCL 5 MG PO TABS: 5.0000 mg | ORAL_TABLET | Freq: Every day | ORAL | 2 refills | Status: DC | PRN

## 2022-01-08 MED ORDER — NA SULFATE-K SULFATE-MG SULF 17.5-3.13-1.6 GM/177ML PO SOLN: 1.0000 | Freq: Once | ORAL | 0 refills | Status: AC

## 2022-01-08 NOTE — Addendum Note (Signed)
Addended by: Lurlean Nanny on: 01/08/2022 04:44 PM ? ? Modules accepted: Orders ? ?

## 2022-01-08 NOTE — Progress Notes (Signed)
? ? ?Gastroenterology Consultation ? ?Referring Provider:     Michela Pitcher, NP ?Primary Care Physician:  Michela Pitcher, NP ?Primary Gastroenterologist:  Dr. Allen Norris     ?Reason for Consultation:     Gastroparesis ?      ? HPI:   ?[name] is a 61 y.o. y/o male referred for consultation & management of Gastroparesis by Dr. Charmian Muff, Alyson Locket, NP.  This patient comes in today with a history of a CABG for 3 vessel disease and reports that he had surgery for an impacted cysts which resulted in him having a heart attack and thereby finding out that he had cardiac disease. He is now concerned about undergoing a colonoscopy which she has never had and the sedation associated with the procedure.  The patient also reports that he has diabetes with gastroparesis and has attacks of nausea and vomiting with diarrhea that usually last 2 days but come intermittently.  There is no report of any black stools or bloody stools.  The patient also denies any fevers or chills. ? ?Past Medical History:  ?Diagnosis Date  ? Abrasion L FOREARM  ? Atrial fibrillation ([place])   ? a. initially occurring in post-op setting in 05/2016, started on Eliquis b. 08/2016: s/p clipping of atrial appendage at time of CABG --> Eliquis switched to Coumadin  ? CAD (coronary artery disease)   ? a. 05/2016: NSTEMI, cath showing 3v disease with CABG recommended once MRSA infection resolved. b. 08/2016: CABG with LIMA-LAD, SVG-D1, and SVG-RI.  ? Cancer [facility])   ? basal cell carcinoma of the skin  ? Diabetes mellitus without complication ([place])   ? Headache(784.0)   ? Hematuria, microscopic "SINCE I WAS A KID"  ? Hyperlipidemia   ? Hypertension   ? Ischemic cardiomyopathy   ? a. 05/2016: echo w/ EF of 25-35% and akinesis of the basal-midinferolateral and inferior myocardium.  ? Lactose intolerance   ? Myocardial infarction [facility])   ? Respiratory failure ([place])   ? Rotator cuff tear RIGHT  ? Tobacco use   ? a. quit in 05/2016 following MI  ? ? ?Past Surgical  History:  ?Procedure Laterality Date  ? AMPUTATION TOE Left 02/06/2020  ? 5th ray amputation with graph [facility])  ? CARDIAC CATHETERIZATION N/A 06/16/2016  ? Procedure: Left Heart Cath and Coronary Angiography;  Surgeon: Troy Sine, MD;  Location: [place] [facility];  Service: Cardiovascular;  Laterality: N/A;  ? CLIPPING OF ATRIAL APPENDAGE N/A 08/24/2016  ? Procedure: CLIPPING OF ATRIAL APPENDAGE;  Surgeon: Ivin Poot, MD;  Location: [place];  Service: Open Heart Surgery;  Laterality: N/A;  ? CORONARY ARTERY BYPASS GRAFT N/A 08/24/2016  ? Procedure: CORONARY ARTERY BYPASS GRAFTING (CABG) x three,  using left internal mammary artery and right leg greater saphenous vein harvested endoscopically;  Surgeon: Ivin Poot, MD;  Location: [place];  Service: Open Heart Surgery;  Laterality: N/A;  ? HERNIA REPAIR  X2  ? INCISION AND DRAINAGE ABSCESS Left 06/10/2016  ? Procedure: INCISION AND DRAINAGE LEFT AXILLARY ABSCESS;  Surgeon: Autumn Messing III, MD;  Location: [facility];  Service: General;  Laterality: Left;  ? ROTATOR CUFF REPAIR Bilateral   ? TEE WITHOUT CARDIOVERSION N/A 08/24/2016  ? Procedure: TRANSESOPHAGEAL ECHOCARDIOGRAM (TEE);  Surgeon: Ivin Poot, MD;  Location: [place];  Service: Open Heart Surgery;  Laterality: N/A;  ? ? ?Prior to Admission medications   ?Medication Sig Start Date End Date Taking? Authorizing Provider  ?acetaminophen (  TYLENOL) 500 MG tablet Take 1,000 mg by mouth every 6 (six) hours as needed for headache.   Yes [provider]  ?aspirin 81 MG chewable tablet Chew 1 tablet (81 mg total) by mouth daily. 06/19/16  Yes Rai, Ripudeep K, MD  ?atorvastatin (LIPITOR) 80 MG tablet Take 1 tablet (80 mg total) by mouth at bedtime. 11/17/21  Yes Michela Pitcher, NP  ?citalopram (CELEXA) 20 MG tablet Take 1 tablet by mouth daily. 08/14/21 08/14/22 Yes [provider]  ?furosemide (LASIX) 20 MG tablet Take 20 mg by mouth daily as needed.   Yes [provider]  ?gabapentin  (NEURONTIN) 300 MG capsule TAKE 2 CAPSULES BY MOUTH AT BEDTIME 01/08/22  Yes Michela Pitcher, NP  ?glucose blood test strip 1 each by Other route as needed for other. For one touch ultra meter. DX: E11.9 02/20/19  Yes Elby Beck, FNP  ?insulin aspart protamine - aspart (NOVOLOG MIX 70/30 FLEXPEN) (70-30) 100 UNIT/ML FlexPen Inject 50 units before breakfast and 70 units before dinner 07/09/20  Yes [provider]  ?Insulin Pen Needle 32G X 4 MM MISC Use to inject Insulin daily. Dx: E11.9 01/05/18  Yes Elby Beck, FNP  ?Insulin Syringe-Needle U-100 (INSULIN SYRINGE .3CC/31GX5/16") 31G X 5/16" 0.3 ML MISC 1 Units by Does not apply route 2 (two) times daily. 05/07/20  Yes Elby Beck, FNP  ?JARDIANCE 25 MG TABS tablet Take 25 mg by mouth daily. 10/22/20  Yes [provider]  ?Lancets Glory [place] ULTRASOFT) lancets Use as instructed 01/05/18  Yes Elby Beck, FNP  ?metoCLOPramide (REGLAN) 5 MG tablet Take 1 tablet (5 mg total) by mouth daily as needed for nausea. 01/08/22  Yes Lucilla Lame, MD  ?metoprolol succinate (TOPROL-XL) 100 MG 24 hr tablet Take 100 mg by mouth daily. 04/17/21  Yes [provider]  ?Na Sulfate-K Sulfate-Mg Sulf 17.5-3.13-1.6 GM/177ML SOLN Take 1 kit by mouth once for 1 dose. 01/08/22 01/08/22 Yes Lucilla Lame, MD  ?sacubitril-valsartan (ENTRESTO) 24-26 MG Take 0.5 tablets by mouth 2 (two) times daily. 05/27/20  Yes [provider]  ?SANTYL ointment Apply 1 application topically daily. 09/15/21  Yes [provider]  ?sitaGLIPtin (JANUVIA) 100 MG tablet Take 1 tablet by mouth daily. 12/01/21  Yes [provider]  ?Varenicline Tartrate, Starter, 0.5 MG X 11 & 1 MG X 42 TBPK  10/27/21  Yes [provider]  ?ezetimibe (ZETIA) 10 MG tablet Take 1 tablet by mouth daily. 02/20/20 10/29/21  [provider]  ?nitroGLYCERIN (NITROSTAT) 0.4 MG SL tablet Place 1 tablet (0.4 mg total) under the tongue every 5 (five) minutes as needed  for chest pain. 04/20/18 07/19/18  End, Harrell Gave, MD  ?traZODone (DESYREL) 50 MG tablet Take by mouth. 10/02/21 01/05/22  [provider]  ? ? ?Family History  ?Problem Relation Age of Onset  ? Hypertension Mother   ? Hypertension Father   ? Sudden death Father 14  ?     at age of death  ? Diabetes Father   ? Heart attack Father   ? Heart attack Sister   ? Cancer Maternal Aunt   ?     breast x2 aunts  ?  ? ?Social History  ? ?Tobacco Use  ? Smoking status: Every Day  ?  Packs/day: 1.50  ?  Years: 42.00  ?  Pack years: 63.00  ?  Types: Cigarettes  ?  Last attempt to quit: 09/25/2019  ?  Years since quitting: 2.2  ?  Smokeless tobacco: Never  ?Substance Use Topics  ? Alcohol use: No  ? Drug use: No  ? ? ?Allergies as of 01/08/2022 - Review Complete 01/08/2022  ?Allergen Reaction Noted  ? Latex Rash 06/16/2016  ? ? ?Review of Systems:    ?All systems reviewed and negative except where noted in HPI. ? ? Physical Exam:  ?BP 131/79   Pulse 75   Temp 98.2 ?F (36.8 ?C) (Oral)   Ht '5\' 5"'  (1.651 m)   Wt 173 lb (78.5 kg)   BMI 28.79 kg/m?  ?No LMP for male patient. ?General:   Alert,  Well-developed, well-nourished, pleasant and cooperative in NAD ?Head:  Normocephalic and atraumatic. ?Eyes:  Sclera clear, no icterus.   Conjunctiva pink. ?Ears:  Normal auditory acuity. ?Neck:  Supple; no masses or thyromegaly. ?Lungs:  Respirations even and unlabored.  Clear throughout to auscultation.   No wheezes, crackles, or rhonchi. No acute distress. ?Heart:  Regular rate and rhythm; no murmurs, clicks, rubs, or gallops. ?Abdomen:  Normal bowel sounds.  No bruits.  Soft, non-tender and non-distended without masses, hepatosplenomegaly or hernias noted.  No guarding or rebound tenderness.  Negative Carnett sign.   ?Rectal:  Deferred.  ?Pulses:  Normal pulses noted. ?Extremities:  No clubbing or edema.  No cyanosis. ?Neurologic:  Alert and oriented x3;  grossly normal neurologically. ?Skin:  Intact without significant lesions or  rashes.  No jaundice. ?Lymph Nodes:  No significant cervical adenopathy. ?Psych:  Alert and cooperative. Normal mood and affect. ? ?Imaging Studies: ?CT CHEST LUNG CA SCREEN LOW DOSE W/O CM ? ?Result Date: 01/02/2022 ?C

## 2022-01-14 ENCOUNTER — Encounter (HOSPITAL_BASED_OUTPATIENT_CLINIC_OR_DEPARTMENT_OTHER): Payer: Medicare Other | Admitting: Internal Medicine

## 2022-01-14 DIAGNOSIS — I25119 Atherosclerotic heart disease of native coronary artery with unspecified angina pectoris: Secondary | ICD-10-CM | POA: Diagnosis not present

## 2022-01-14 DIAGNOSIS — E11622 Type 2 diabetes mellitus with other skin ulcer: Secondary | ICD-10-CM | POA: Diagnosis not present

## 2022-01-14 DIAGNOSIS — L89513 Pressure ulcer of right ankle, stage 3: Secondary | ICD-10-CM

## 2022-01-14 DIAGNOSIS — F17210 Nicotine dependence, cigarettes, uncomplicated: Secondary | ICD-10-CM | POA: Diagnosis not present

## 2022-01-14 DIAGNOSIS — E1165 Type 2 diabetes mellitus with hyperglycemia: Secondary | ICD-10-CM | POA: Diagnosis not present

## 2022-01-14 DIAGNOSIS — I110 Hypertensive heart disease with heart failure: Secondary | ICD-10-CM | POA: Diagnosis not present

## 2022-01-14 DIAGNOSIS — Z794 Long term (current) use of insulin: Secondary | ICD-10-CM | POA: Diagnosis not present

## 2022-01-14 DIAGNOSIS — Z951 Presence of aortocoronary bypass graft: Secondary | ICD-10-CM | POA: Diagnosis not present

## 2022-01-14 DIAGNOSIS — I5022 Chronic systolic (congestive) heart failure: Secondary | ICD-10-CM | POA: Diagnosis not present

## 2022-01-14 DIAGNOSIS — Z89422 Acquired absence of other left toe(s): Secondary | ICD-10-CM | POA: Diagnosis not present

## 2022-01-14 DIAGNOSIS — L97522 Non-pressure chronic ulcer of other part of left foot with fat layer exposed: Secondary | ICD-10-CM | POA: Diagnosis not present
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
## 2022-01-14 NOTE — Progress Notes (Signed)
CHARVEZ, VOORHIES ([id number]) ?Visit Report for 01/14/2022 ?Arrival Information Details ?Patient Name: [name], [name] ?Date of Service: 01/14/2022 2:30 PM ?Medical Record Number: [id number] ?Patient Account Number: [id number] ?Date of Birth/Sex: 14-Mar-1961 (61 y.o. M) ?Treating RN: Donnamarie Poag ?Primary Care Alexus Michael: Karl Ito Other Clinician: ?Referring Tanuj Mullens: Karl Ito ?Treating Taro Hidrogo/Extender: Kalman Shan ?Weeks in Treatment: 23 ?Visit Information History Since Last Visit ?Added or deleted any medications: No ?Patient Arrived: Ambulatory ?Had a fall or experienced change in No ?Arrival Time: 14:36 ?activities of daily living that may affect ?Accompanied By: daughter ?risk of falls: ?Transfer Assistance: None ?Hospitalized since last visit: No ?Patient Identification Verified: Yes ?Has Dressing in Place as Prescribed: Yes ?Secondary Verification Process Completed: Yes ?Pain Present Now: No ?Patient Requires Transmission-Based Precautions: No ?Patient Has Alerts: Yes ?Patient Alerts: DIABETIC ?Electronic Signature(s) ?Signed: 01/14/2022 3:52:05 PM By: Donnamarie Poag ?Entered ByDonnamarie Poag on 01/14/2022 14:44:08 ?RAYFORD, WILLIAMSEN ([id number]) ?-------------------------------------------------------------------------------- ?Encounter Discharge Information Details ?Patient Name: JERZY, ROEPKE ?Date of Service: 01/14/2022 2:30 PM ?Medical Record Number: [id number] ?Patient Account Number: [id number] ?Date of Birth/Sex: 1961-02-07 (61 y.o. M) ?Treating RN: Donnamarie Poag ?Primary Care Adline Kirshenbaum: Karl Ito Other Clinician: ?Referring Sherlonda Flater: Karl Ito ?Treating Avan Gullett/Extender: Kalman Shan ?Weeks in Treatment: 23 ?Encounter Discharge Information Items Post Procedure Vitals ?Discharge Condition: Stable ?Temperature (?F): 98.1 ?Ambulatory Status: Ambulatory ?Pulse (bpm): 76 ?Discharge Destination: Home ?Respiratory Rate (breaths/min): 16 ?Transportation: Private Auto ?Blood Pressure (mmHg):  136/76 ?Accompanied By: daughter ?Schedule Follow-up Appointment: Yes ?Clinical Summary of Care: ?Electronic Signature(s) ?Signed: 01/14/2022 3:52:05 PM By: Donnamarie Poag ?Entered ByDonnamarie Poag on 01/14/2022 15:07:14 ?ART, LEVAN ([id number]) ?-------------------------------------------------------------------------------- ?Lower Extremity Assessment Details ?Patient Name: NATION, CRADLE ?Date of Service: 01/14/2022 2:30 PM ?Medical Record Number: [id number] ?Patient Account Number: [id number] ?Date of Birth/Sex: 1961/08/05 (61 y.o. M) ?Treating RN: Donnamarie Poag ?Primary Care Kijana Estock: Karl Ito Other Clinician: ?Referring Dempsy Damiano: Karl Ito ?Treating Aliceson Dolbow/Extender: Kalman Shan ?Weeks in Treatment: 23 ?Edema Assessment ?Assessed: [Left: No] [Right: Yes] ?[Left: Edema] [Right: :] ?Calf ?Left: Right: ?Point of Measurement: 34 cm From Medial Instep 33 cm ?Ankle ?Left: Right: ?Point of Measurement: 10 cm From Medial Instep 20.5 cm ?Vascular Assessment ?Pulses: ?Dorsalis Pedis ?Palpable: [Right:Yes] ?Electronic Signature(s) ?Signed: 01/14/2022 3:52:05 PM By: Donnamarie Poag ?Entered ByDonnamarie Poag on 01/14/2022 14:51:42 ?JAQUEZ, FARRINGTON ([id number]) ?-------------------------------------------------------------------------------- ?Multi Wound Chart Details ?Patient Name: RAYBON, CONARD ?Date of Service: 01/14/2022 2:30 PM ?Medical Record Number: [id number] ?Patient Account Number: [id number] ?Date of Birth/Sex: April 28, 1961 (61 y.o. M) ?Treating RN: Donnamarie Poag ?Primary Care Caidence Kaseman: Karl Ito Other Clinician: ?Referring Zoe Creasman: Karl Ito ?Treating Ewart Carrera/Extender: Kalman Shan ?Weeks in Treatment: 23 ?Vital Signs ?Height(in): 76 ?Pulse(bpm): 76 ?Weight(lbs): 180 ?Blood Pressure(mmHg): 136/72 ?Body Mass Index(BMI): 21.9 ?Temperature(??F): 98.1 ?Respiratory Rate(breaths/min): 16 ?Photos: [N/A:N/A] ?Wound Location: Right Ankle N/A N/A ?Wounding Event: Gradually Appeared N/A N/A ?Primary Etiology:  Pressure Ulcer N/A N/A ?Comorbid History: Coronary Artery Disease, N/A N/A ?Hypertension, Myocardial Infarction, ?Type II Diabetes ?Date Acquired: 11/30/2021 N/A N/A ?Weeks of Treatment: 6 N/A N/A ?Wound Status: Open N/A N/A ?Wound Recurrence: No N/A N/A ?Clustered Wound: Yes N/A N/A ?Measurements L x W x D (cm) 1.4x2x0.1 N/A N/A ?Area (cm?) : 2.199 N/A N/A ?Volume (cm?) : 0.22 N/A N/A ?% Reduction in Area: -8.10% N/A N/A ?% Reduction in Volume: 45.90% N/A N/A ?Classification: Category/Stage III N/A N/A ?Exudate Amount: Medium N/A N/A ?Exudate Type: Serosanguineous N/A N/A ?Exudate Color: red, brown N/A N/A ?Granulation Amount: Small (1-33%) N/A N/A ?Granulation Quality: Pink, Pale N/A N/A ?Necrotic  Amount: Large (67-100%) N/A N/A ?Necrotic Tissue: Eschar, Adherent Slough N/A N/A ?Exposed Structures: ?Fat Layer (Subcutaneous Tissue): N/A N/A ?Yes ?Fascia: No ?Tendon: No ?Muscle: No ?Joint: No ?Bone: No ?Epithelialization: None N/A N/A ?Treatment Notes ?Electronic Signature(s) ?Signed: 01/14/2022 3:52:05 PM By: Donnamarie Poag ?Entered ByDonnamarie Poag on 01/14/2022 14:52:18 ?ACEL, NATZKE ([id number]) ?ROMA, BIERLEIN ([id number]) ?-------------------------------------------------------------------------------- ?Multi-Disciplinary Care Plan Details ?Patient Name: CREEDON, DANIELSKI ?Date of Service: 01/14/2022 2:30 PM ?Medical Record Number: [id number] ?Patient Account Number: [id number] ?Date of Birth/Sex: 12/03/1960 (61 y.o. M) ?Treating RN: Donnamarie Poag ?Primary Care Jettson Crable: Karl Ito Other Clinician: ?Referring Edwena Mayorga: Karl Ito ?Treating Rosaly Labarbera/Extender: Kalman Shan ?Weeks in Treatment: 23 ?Active Inactive ?Electronic Signature(s) ?Signed: 01/14/2022 3:52:05 PM By: Donnamarie Poag ?Entered ByDonnamarie Poag on 01/14/2022 14:52:03 ?NAZARIO, RUSSOM ([id number]) ?-------------------------------------------------------------------------------- ?Pain Assessment Details ?Patient Name: DORRANCE, SELLICK ?Date of Service:  01/14/2022 2:30 PM ?Medical Record Number: [id number] ?Patient Account Number: [id number] ?Date of Birth/Sex: 05-Oct-1961 (61 y.o. M) ?Treating RN: Donnamarie Poag ?Primary Care Yakov Bergen: Karl Ito Other Clinician: ?Referring Zorina Mallin: Karl Ito ?Treating Astella Desir/Extender: Kalman Shan ?Weeks in Treatment: 23 ?Active Problems ?Location of Pain Severity and Description of Pain ?Patient Has Paino No ?Site Locations ?Rate the pain. ?Current Pain Level: 0 ?Pain Management and Medication ?Current Pain Management: ?Electronic Signature(s) ?Signed: 01/14/2022 3:52:05 PM By: Donnamarie Poag ?Entered ByDonnamarie Poag on 01/14/2022 14:44:49 ?STAR, CHEESE ([id number]) ?-------------------------------------------------------------------------------- ?Patient/Caregiver Education Details ?Patient Name: RAYCE, BRAHMBHATT ?Date of Service: 01/14/2022 2:30 PM ?Medical Record Number: [id number] ?Patient Account Number: [id number] ?Date of Birth/Gender: 1960-11-05 (61 y.o. M) ?Treating RN: Donnamarie Poag ?Primary Care Physician: Karl Ito Other Clinician: ?Referring Physician: Karl Ito ?Treating Physician/Extender: Kalman Shan ?Weeks in Treatment: 23 ?Education Assessment ?Education Provided To: ?Patient ?Education Topics Provided ?Basic Hygiene: ?Wound/Skin Impairment: ?Electronic Signature(s) ?Signed: 01/14/2022 3:52:05 PM By: Donnamarie Poag ?Entered ByDonnamarie Poag on 01/14/2022 14:54:45 ?OTHEL, HOOGENDOORN ([id number]) ?-------------------------------------------------------------------------------- ?Wound Assessment Details ?Patient Name: BRADDEN, TADROS ?Date of Service: 01/14/2022 2:30 PM ?Medical Record Number: [id number] ?Patient Account Number: [id number] ?Date of Birth/Sex: 10-11-1961 (61 y.o. M) ?Treating RN: Donnamarie Poag ?Primary Care Rettie Laird: Karl Ito Other Clinician: ?Referring Kolbee Stallman: Karl Ito ?Treating Lauria Depoy/Extender: Kalman Shan ?Weeks in Treatment: 23 ?Wound Status ?Wound Number: 7 Primary Pressure  Ulcer ?Etiology: ?Wound Location: Right Ankle ?Wound Status: Open ?Wounding Event: Gradually Appeared ?Comorbid Coronary Artery Disease, Hypertension, Myocardial ?Date Acquired: 11/30/2021 ?History: Infarction, Type II D

## 2022-01-15 DIAGNOSIS — I5020 Unspecified systolic (congestive) heart failure: Secondary | ICD-10-CM | POA: Diagnosis not present

## 2022-01-15 DIAGNOSIS — G4733 Obstructive sleep apnea (adult) (pediatric): Secondary | ICD-10-CM | POA: Diagnosis not present

## 2022-01-20 DIAGNOSIS — G4733 Obstructive sleep apnea (adult) (pediatric): Secondary | ICD-10-CM | POA: Diagnosis not present

## 2022-01-21 ENCOUNTER — Encounter: Payer: Medicare Other | Attending: Internal Medicine | Admitting: Internal Medicine

## 2022-01-21 DIAGNOSIS — Z951 Presence of aortocoronary bypass graft: Secondary | ICD-10-CM | POA: Diagnosis not present

## 2022-01-21 DIAGNOSIS — L89513 Pressure ulcer of right ankle, stage 3: Secondary | ICD-10-CM | POA: Diagnosis not present

## 2022-01-21 DIAGNOSIS — I25119 Atherosclerotic heart disease of native coronary artery with unspecified angina pectoris: Secondary | ICD-10-CM | POA: Diagnosis not present

## 2022-01-21 DIAGNOSIS — L97522 Non-pressure chronic ulcer of other part of left foot with fat layer exposed: Secondary | ICD-10-CM | POA: Insufficient documentation

## 2022-01-21 DIAGNOSIS — Z794 Long term (current) use of insulin: Secondary | ICD-10-CM | POA: Insufficient documentation

## 2022-01-21 DIAGNOSIS — I5022 Chronic systolic (congestive) heart failure: Secondary | ICD-10-CM | POA: Diagnosis not present

## 2022-01-21 DIAGNOSIS — F17210 Nicotine dependence, cigarettes, uncomplicated: Secondary | ICD-10-CM | POA: Insufficient documentation

## 2022-01-21 DIAGNOSIS — I110 Hypertensive heart disease with heart failure: Secondary | ICD-10-CM | POA: Diagnosis not present

## 2022-01-21 DIAGNOSIS — E11622 Type 2 diabetes mellitus with other skin ulcer: Secondary | ICD-10-CM | POA: Diagnosis not present

## 2022-01-22 NOTE — Progress Notes (Signed)
ZAKAR, BROSCH ([id number]) ?Visit Report for 01/21/2022 ?Arrival Information Details ?Patient Name: MURRELL, DOME ?Date of Service: 01/21/2022 11:00 AM ?Medical Record Number: [id number] ?Patient Account Number: [id number] ?Date of Birth/Sex: 06/07/1961 (61 y.o. M) ?Treating RN: Carlene Coria ?Primary Care Rashae Rother: Karl Ito Other Clinician: ?Referring Celica Kotowski: Karl Ito ?Treating Juanangel Soderholm/Extender: Kalman Shan ?Weeks in Treatment: 24 ?Visit Information History Since Last Visit ?All ordered tests and consults were completed: No ?Patient Arrived: Ambulatory ?Added or deleted any medications: No ?Arrival Time: 11:03 ?Any new allergies or adverse reactions: No ?Accompanied By: self ?Had a fall or experienced change in No ?Transfer Assistance: None ?activities of daily living that may affect ?Patient Identification Verified: Yes ?risk of falls: ?Secondary Verification Process Completed: Yes ?Signs or symptoms of abuse/neglect since last visito No ?Patient Requires Transmission-Based Precautions: No ?Hospitalized since last visit: No ?Patient Has Alerts: Yes ?Implantable device outside of the clinic excluding No ?Patient Alerts: DIABETIC ?cellular tissue based products placed in the center ?since last visit: ?Has Dressing in Place as Prescribed: Yes ?Pain Present Now: No ?Electronic Signature(s) ?Signed: 01/22/2022 9:23:23 AM By: Carlene Coria RN ?Entered By: Carlene Coria on 01/21/2022 11:08:12 ?PORTER, MOES ([id number]) ?-------------------------------------------------------------------------------- ?Clinic Level of Care Assessment Details ?Patient Name: LINC, RENNE ?Date of Service: 01/21/2022 11:00 AM ?Medical Record Number: [id number] ?Patient Account Number: [id number] ?Date of Birth/Sex: 11/10/1960 (61 y.o. M) ?Treating RN: Carlene Coria ?Primary Care Roshawna Colclasure: Karl Ito Other Clinician: ?Referring Cohl Behrens: Karl Ito ?Treating Sonoma Firkus/Extender: Kalman Shan ?Weeks in Treatment: 24 ?Clinic  Level of Care Assessment Items ?TOOL 4 Quantity Score ?X - Use when only an EandM is performed on FOLLOW-UP visit 1 0 ?ASSESSMENTS - Nursing Assessment / Reassessment ?X - Reassessment of Co-morbidities (includes updates in patient status) 1 10 ?X- 1 5 ?Reassessment of Adherence to Treatment Plan ?ASSESSMENTS - Wound and Skin Assessment / Reassessment ?X - Simple Wound Assessment / Reassessment - one wound 1 5 ?'[]'$  - 0 ?Complex Wound Assessment / Reassessment - multiple wounds ?'[]'$  - 0 ?Dermatologic / Skin Assessment (not related to wound area) ?ASSESSMENTS - Focused Assessment ?'[]'$  - Circumferential Edema Measurements - multi extremities 0 ?'[]'$  - 0 ?Nutritional Assessment / Counseling / Intervention ?'[]'$  - 0 ?Lower Extremity Assessment (monofilament, tuning fork, pulses) ?'[]'$  - 0 ?Peripheral Arterial Disease Assessment (using hand held doppler) ?ASSESSMENTS - Ostomy and/or Continence Assessment and Care ?'[]'$  - Incontinence Assessment and Management 0 ?'[]'$  - 0 ?Ostomy Care Assessment and Management (repouching, etc.) ?PROCESS - Coordination of Care ?X - Simple Patient / Family Education for ongoing care 1 15 ?'[]'$  - 0 ?Complex (extensive) Patient / Family Education for ongoing care ?'[]'$  - 0 ?Staff obtains Consents, Records, Test Results / Process Orders ?'[]'$  - 0 ?Staff telephones HHA, Nursing Homes / Clarify orders / etc ?'[]'$  - 0 ?Routine Transfer to another Facility (non-emergent condition) ?'[]'$  - 0 ?Routine Hospital Admission (non-emergent condition) ?'[]'$  - 0 ?New Admissions / Biomedical engineer / Ordering NPWT, Apligraf, etc. ?'[]'$  - 0 ?Emergency Hospital Admission (emergent condition) ?X- 1 10 ?Simple Discharge Coordination ?'[]'$  - 0 ?Complex (extensive) Discharge Coordination ?PROCESS - Special Needs ?'[]'$  - Pediatric / Minor Patient Management 0 ?'[]'$  - 0 ?Isolation Patient Management ?'[]'$  - 0 ?Hearing / Language / Visual special needs ?'[]'$  - 0 ?Assessment of Community assistance (transportation, D/C planning, etc.) ?'[]'$  -  0 ?Additional assistance / Altered mentation ?'[]'$  - 0 ?Support Surface(s) Assessment (bed, cushion, seat, etc.) ?INTERVENTIONS - Wound Cleansing / Measurement ?JAYDYN, BOZZO ([id number]) ?X- 1 5 ?Simple  Wound Cleansing - one wound ?'[]'$  - 0 ?Complex Wound Cleansing - multiple wounds ?X- 1 5 ?Wound Imaging (photographs - any number of wounds) ?'[]'$  - 0 ?Wound Tracing (instead of photographs) ?X- 1 5 ?Simple Wound Measurement - one wound ?'[]'$  - 0 ?Complex Wound Measurement - multiple wounds ?INTERVENTIONS - Wound Dressings ?'[]'$  - Small Wound Dressing one or multiple wounds 0 ?'[]'$  - 0 ?Medium Wound Dressing one or multiple wounds ?'[]'$  - 0 ?Large Wound Dressing one or multiple wounds ?'[]'$  - 0 ?Application of Medications - topical ?'[]'$  - 0 ?Application of Medications - injection ?INTERVENTIONS - Miscellaneous ?'[]'$  - External ear exam 0 ?'[]'$  - 0 ?Specimen Collection (cultures, biopsies, blood, body fluids, etc.) ?'[]'$  - 0 ?Specimen(s) / Culture(s) sent or taken to Lab for analysis ?'[]'$  - 0 ?Patient Transfer (multiple staff / Civil Service fast streamer / Similar devices) ?'[]'$  - 0 ?Simple Staple / Suture removal (25 or less) ?'[]'$  - 0 ?Complex Staple / Suture removal (26 or more) ?'[]'$  - 0 ?Hypo / Hyperglycemic Management (close monitor of Blood Glucose) ?'[]'$  - 0 ?Ankle / Brachial Index (ABI) - do not check if billed separately ?X- 1 5 ?Vital Signs ?Has the patient been seen at the hospital within the last three years: Yes ?Total Score: 65 ?Level Of Care: New/Established - Level ?2 ?Electronic Signature(s) ?Signed: 01/22/2022 9:23:23 AM By: Carlene Coria RN ?Entered By: Carlene Coria on 01/21/2022 11:31:54 ?ZAMARI, VEA ([id number]) ?-------------------------------------------------------------------------------- ?Encounter Discharge Information Details ?Patient Name: HAARIS, METALLO ?Date of Service: 01/21/2022 11:00 AM ?Medical Record Number: [id number] ?Patient Account Number: [id number] ?Date of Birth/Sex: 1961/04/26 (61 y.o. M) ?Treating RN: Carlene Coria ?Primary Care Gwyneth Fernandez: Karl Ito Other Clinician: ?Referring Hevin Jeffcoat: Karl Ito ?Treating Gjon Letarte/Extender: Kalman Shan ?Weeks in Treatment: 24 ?Encounter Discharge Information Items ?Discharge Condition: Stable ?Ambulatory Status: Ambulatory ?Discharge Destination: Home ?Transportation: Private Auto ?Accompanied By: daughter ?Schedule Follow-up Appointment: Yes ?Clinical Summary of Care: Patient Declined ?Electronic Signature(s) ?Signed: 01/22/2022 9:23:23 AM By: Carlene Coria RN ?Entered By: Carlene Coria on 01/21/2022 11:35:41 ?LONEY, DOMINGO ([id number]) ?-------------------------------------------------------------------------------- ?Lower Extremity Assessment Details ?Patient Name: LAWERENCE, DERY ?Date of Service: 01/21/2022 11:00 AM ?Medical Record Number: [id number] ?Patient Account Number: [id number] ?Date of Birth/Sex: 04-28-1961 (61 y.o. M) ?Treating RN: Carlene Coria ?Primary Care Hanny Elsberry: Karl Ito Other Clinician: ?Referring Lettie Czarnecki: Karl Ito ?Treating Thora Scherman/Extender: Kalman Shan ?Weeks in Treatment: 24 ?Edema Assessment ?Assessed: [Left: No] [Right: No] ?[Left: Edema] [Right: :] ?Calf ?Left: Right: ?Point of Measurement: 34 cm From Medial Instep 33 cm ?Ankle ?Left: Right: ?Point of Measurement: 10 cm From Medial Instep 20 cm ?Vascular Assessment ?Pulses: ?Dorsalis Pedis ?Palpable: [Right:Yes] ?Electronic Signature(s) ?Signed: 01/22/2022 9:23:23 AM By: Carlene Coria RN ?Entered By: Carlene Coria on 01/21/2022 11:15:26 ?BENJIMEN, KELLEY ([id number]) ?-------------------------------------------------------------------------------- ?Multi Wound Chart Details ?Patient Name: ANTOINNE, SPADACCINI ?Date of Service: 01/21/2022 11:00 AM ?Medical Record Number: [id number] ?Patient Account Number: [id number] ?Date of Birth/Sex: 1961-03-22 (61 y.o. M) ?Treating RN: Carlene Coria ?Primary Care Joletta Manner: Karl Ito Other Clinician: ?Referring Solana Coggin: Karl Ito ?Treating Jillane Po/Extender:  Kalman Shan ?Weeks in Treatment: 24 ?Vital Signs ?Height(in): 76 ?Pulse(bpm): 74 ?Weight(lbs): 180 ?Blood Pressure(mmHg): 150/81 ?Body Mass Index(BMI): 21.9 ?Temperature(??F): 98.5 ?Respiratory Rate(breaths/min): 18 ?Ph
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
## 2022-01-28 ENCOUNTER — Encounter: Payer: Medicare Other | Admitting: Internal Medicine

## 2022-01-30 DIAGNOSIS — E113412 Type 2 diabetes mellitus with severe nonproliferative diabetic retinopathy with macular edema, left eye: Secondary | ICD-10-CM | POA: Diagnosis not present

## 2022-02-04 ENCOUNTER — Encounter: Payer: Medicare Other | Admitting: Internal Medicine

## 2022-02-05 ENCOUNTER — Ambulatory Visit (INDEPENDENT_AMBULATORY_CARE_PROVIDER_SITE_OTHER): Payer: Medicare Other | Admitting: Sports Medicine

## 2022-02-05 ENCOUNTER — Encounter: Payer: Self-pay | Admitting: Sports Medicine

## 2022-02-05 DIAGNOSIS — L84 Corns and callosities: Secondary | ICD-10-CM

## 2022-02-05 DIAGNOSIS — B351 Tinea unguium: Secondary | ICD-10-CM

## 2022-02-05 DIAGNOSIS — M2040 Other hammer toe(s) (acquired), unspecified foot: Secondary | ICD-10-CM

## 2022-02-05 DIAGNOSIS — E1142 Type 2 diabetes mellitus with diabetic polyneuropathy: Secondary | ICD-10-CM

## 2022-02-05 DIAGNOSIS — Z89422 Acquired absence of other left toe(s): Secondary | ICD-10-CM

## 2022-02-05 DIAGNOSIS — M79609 Pain in unspecified limb: Secondary | ICD-10-CM | POA: Diagnosis not present
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
## 2022-02-10 DIAGNOSIS — E119 Type 2 diabetes mellitus without complications: Secondary | ICD-10-CM | POA: Diagnosis not present

## 2022-02-11 ENCOUNTER — Encounter: Payer: Medicare Other | Admitting: Internal Medicine

## 2022-02-11 ENCOUNTER — Encounter: Payer: Self-pay | Admitting: Gastroenterology

## 2022-02-12 ENCOUNTER — Encounter
Admission: RE | Disposition: A | Discharge status: Home / Self Care | Payer: Self-pay | Source: Home / Self Care | Attending: Gastroenterology

## 2022-02-12 ENCOUNTER — Ambulatory Visit
Admission: RE | Admit: 2022-02-12 | Discharge: 2022-02-12 | Disposition: A | Discharge status: Home / Self Care | Payer: Medicare Other | Attending: Gastroenterology | Admitting: Gastroenterology

## 2022-02-12 ENCOUNTER — Ambulatory Visit: Payer: Medicare Other | Admitting: Anesthesiology

## 2022-02-12 ENCOUNTER — Encounter: Payer: Self-pay | Admitting: Gastroenterology

## 2022-02-12 DIAGNOSIS — K635 Polyp of colon: Secondary | ICD-10-CM | POA: Diagnosis not present

## 2022-02-12 DIAGNOSIS — I255 Ischemic cardiomyopathy: Secondary | ICD-10-CM | POA: Diagnosis not present

## 2022-02-12 DIAGNOSIS — Z7901 Long term (current) use of anticoagulants: Secondary | ICD-10-CM | POA: Insufficient documentation

## 2022-02-12 DIAGNOSIS — I252 Old myocardial infarction: Secondary | ICD-10-CM | POA: Insufficient documentation

## 2022-02-12 DIAGNOSIS — D122 Benign neoplasm of ascending colon: Secondary | ICD-10-CM | POA: Insufficient documentation

## 2022-02-12 DIAGNOSIS — K641 Second degree hemorrhoids: Secondary | ICD-10-CM | POA: Insufficient documentation

## 2022-02-12 DIAGNOSIS — Z7984 Long term (current) use of oral hypoglycemic drugs: Secondary | ICD-10-CM | POA: Diagnosis not present

## 2022-02-12 DIAGNOSIS — D3616 Benign neoplasm of peripheral nerves and autonomic nervous system of pelvis: Secondary | ICD-10-CM | POA: Insufficient documentation

## 2022-02-12 DIAGNOSIS — Z1211 Encounter for screening for malignant neoplasm of colon: Secondary | ICD-10-CM | POA: Diagnosis not present

## 2022-02-12 DIAGNOSIS — I2510 Atherosclerotic heart disease of native coronary artery without angina pectoris: Secondary | ICD-10-CM | POA: Insufficient documentation

## 2022-02-12 DIAGNOSIS — D124 Benign neoplasm of descending colon: Secondary | ICD-10-CM | POA: Insufficient documentation

## 2022-02-12 DIAGNOSIS — E119 Type 2 diabetes mellitus without complications: Secondary | ICD-10-CM | POA: Insufficient documentation

## 2022-02-12 DIAGNOSIS — D123 Benign neoplasm of transverse colon: Secondary | ICD-10-CM | POA: Insufficient documentation

## 2022-02-12 DIAGNOSIS — F17210 Nicotine dependence, cigarettes, uncomplicated: Secondary | ICD-10-CM | POA: Insufficient documentation

## 2022-02-12 DIAGNOSIS — I10 Essential (primary) hypertension: Secondary | ICD-10-CM | POA: Insufficient documentation

## 2022-02-12 DIAGNOSIS — Z794 Long term (current) use of insulin: Secondary | ICD-10-CM | POA: Diagnosis not present

## 2022-02-12 DIAGNOSIS — Z951 Presence of aortocoronary bypass graft: Secondary | ICD-10-CM | POA: Diagnosis not present

## 2022-02-12 DIAGNOSIS — Z79899 Other long term (current) drug therapy: Secondary | ICD-10-CM | POA: Insufficient documentation

## 2022-02-12 DIAGNOSIS — E785 Hyperlipidemia, unspecified: Secondary | ICD-10-CM | POA: Insufficient documentation

## 2022-02-12 DIAGNOSIS — D126 Benign neoplasm of colon, unspecified: Secondary | ICD-10-CM | POA: Diagnosis not present

## 2022-02-12 DIAGNOSIS — D128 Benign neoplasm of rectum: Secondary | ICD-10-CM | POA: Diagnosis not present

## 2022-02-12 DIAGNOSIS — I4891 Unspecified atrial fibrillation: Secondary | ICD-10-CM | POA: Diagnosis not present

## 2022-02-12 DIAGNOSIS — Z0000 Encounter for general adult medical examination without abnormal findings: Secondary | ICD-10-CM

## 2022-02-12 HISTORY — PX: COLONOSCOPY WITH PROPOFOL: SHX5780

## 2022-02-12 LAB — GLUCOSE, CAPILLARY: Glucose-Capillary: 118 mg/dL — ABNORMAL HIGH (ref 70–99)

## 2022-02-12 SURGERY — COLONOSCOPY WITH PROPOFOL
Anesthesia: General

## 2022-02-12 MED ORDER — STERILE WATER FOR IRRIGATION IR SOLN
Status: DC | PRN
  Administered 2022-02-12: 60 mL

## 2022-02-12 MED ORDER — DEXMEDETOMIDINE HCL IN NACL 400 MCG/100ML IV SOLN
INTRAVENOUS | Status: DC | PRN
  Administered 2022-02-12: 12 ug via INTRAVENOUS
  Administered 2022-02-12: 8 ug via INTRAVENOUS

## 2022-02-12 MED ORDER — SODIUM CHLORIDE 0.9 % IV SOLN
INTRAVENOUS | Status: DC
  Administered 2022-02-12: 1000 mL via INTRAVENOUS

## 2022-02-12 MED ORDER — PROPOFOL 500 MG/50ML IV EMUL
INTRAVENOUS | Status: AC
  Filled 2022-02-12: qty 50

## 2022-02-12 MED ORDER — PROPOFOL 500 MG/50ML IV EMUL
INTRAVENOUS | Status: DC | PRN
Start: 2022-02-12 — End: 2022-02-12
  Administered 2022-02-12: 100 ug/kg/min via INTRAVENOUS

## 2022-02-12 MED ORDER — PROPOFOL 10 MG/ML IV BOLUS
INTRAVENOUS | Status: DC | PRN
Start: 2022-02-12 — End: 2022-02-12
  Administered 2022-02-12: 30 mg via INTRAVENOUS
  Administered 2022-02-12: 20 mg via INTRAVENOUS
  Administered 2022-02-12: 50 mg via INTRAVENOUS
  Administered 2022-02-12: 20 mg via INTRAVENOUS
  Administered 2022-02-12: 30 mg via INTRAVENOUS

## 2022-02-12 MED ORDER — LIDOCAINE HCL (PF) 2 % IJ SOLN
INTRAMUSCULAR | Status: AC
  Filled 2022-02-12: qty 5

## 2022-02-12 MED ORDER — DEXMEDETOMIDINE HCL IN NACL 80 MCG/20ML IV SOLN
INTRAVENOUS | Status: AC
  Filled 2022-02-12: qty 20

## 2022-02-12 MED ORDER — LIDOCAINE HCL (CARDIAC) PF 100 MG/5ML IV SOSY
PREFILLED_SYRINGE | INTRAVENOUS | Status: DC | PRN
  Administered 2022-02-12: 50 mg via INTRAVENOUS

## 2022-02-12 NOTE — H&P (Signed)
? ?Derrick Lame, MD [facility] ?[name] ?[place], [place] [address] ?Phone: [phone] ?Fax : [phone] ? ?Primary Care Physician:  Derrick Pitcher, NP ?Primary Gastroenterologist:  Dr. Allen Norris ? ?Pre-Procedure History & Physical: ?HPI:  [name] is a 61 y.o. male is here for a screening colonoscopy.  ? ?Past Medical History:  ?Diagnosis Date  ? Abrasion L FOREARM  ? Atrial fibrillation ([place])   ? a. initially occurring in post-op setting in 05/2016, started on Eliquis b. 08/2016: s/p clipping of atrial appendage at time of CABG --> Eliquis switched to Coumadin  ? CAD (coronary artery disease)   ? a. 05/2016: NSTEMI, cath showing 3v disease with CABG recommended once MRSA infection resolved. b. 08/2016: CABG with LIMA-LAD, SVG-D1, and SVG-RI.  ? Cancer [facility])   ? basal cell carcinoma of the skin  ? Diabetes mellitus without complication ([place])   ? Headache(784.0)   ? Hematuria, microscopic "SINCE I WAS A KID"  ? Hyperlipidemia   ? Hypertension   ? Ischemic cardiomyopathy   ? a. 05/2016: echo w/ EF of 25-35% and akinesis of the basal-midinferolateral and inferior myocardium.  ? Lactose intolerance   ? Myocardial infarction [facility])   ? Respiratory failure ([place])   ? Rotator cuff tear RIGHT  ? Tobacco use   ? a. quit in 05/2016 following MI  ? ? ?Past Surgical History:  ?Procedure Laterality Date  ? AMPUTATION TOE Left 02/06/2020  ? 5th ray amputation with graph [facility])  ? CARDIAC CATHETERIZATION N/A 06/16/2016  ? Procedure: Left Heart Cath and Coronary Angiography;  Surgeon: Troy Sine, MD;  Location: [place] CV LAB;  Service: Cardiovascular;  Laterality: N/A;  ? CLIPPING OF ATRIAL APPENDAGE N/A 08/24/2016  ? Procedure: CLIPPING OF ATRIAL APPENDAGE;  Surgeon: Ivin Poot, MD;  Location: [place];  Service: Open Heart Surgery;  Laterality: N/A;  ? CORONARY ARTERY BYPASS GRAFT N/A 08/24/2016  ? Procedure: CORONARY ARTERY BYPASS GRAFTING (CABG) x three,  using left internal mammary artery and right leg  greater saphenous vein harvested endoscopically;  Surgeon: Ivin Poot, MD;  Location: [place];  Service: Open Heart Surgery;  Laterality: N/A;  ? HERNIA REPAIR  X2  ? INCISION AND DRAINAGE ABSCESS Left 06/10/2016  ? Procedure: INCISION AND DRAINAGE LEFT AXILLARY ABSCESS;  Surgeon: Autumn Messing III, MD;  [facility];  Service: General;  Laterality: Left;  ? ROTATOR CUFF REPAIR Bilateral   ? TEE WITHOUT CARDIOVERSION N/A 08/24/2016  ? Procedure: TRANSESOPHAGEAL ECHOCARDIOGRAM (TEE);  Surgeon: Ivin Poot, MD;  Location: [place];  Service: Open Heart Surgery;  Laterality: N/A;  ? ? ?Prior to Admission medications   ?Medication Sig Start Date End Date Taking? Authorizing Provider  ?acetaminophen (TYLENOL) 500 MG tablet Take 1,000 mg by mouth every 6 (six) hours as needed for headache.   Yes [provider]  ?aspirin 81 MG chewable tablet Chew 1 tablet (81 mg total) by mouth daily. 06/19/16  Yes Rai, Ripudeep K, MD  ?atorvastatin (LIPITOR) 80 MG tablet Take 1 tablet (80 mg total) by mouth at bedtime. 11/17/21  Yes Derrick Pitcher, NP  ?buPROPion (WELLBUTRIN XL) 150 MG 24 hr tablet Take 150 mg by mouth daily.   Yes [provider]  ?citalopram (CELEXA) 20 MG tablet Take 1 tablet by mouth daily. 08/14/21 08/14/22 Yes [provider]  ?ezetimibe (ZETIA) 10 MG tablet Take 1 tablet by mouth daily. 02/20/20 02/12/22 Yes [provider]  ?furosemide (LASIX) 20 MG  tablet Take 20 mg by mouth daily as needed.   Yes [provider]  ?gabapentin (NEURONTIN) 300 MG capsule TAKE 2 CAPSULES BY MOUTH AT BEDTIME 01/08/22  Yes Derrick Pitcher, NP  ?glucose blood test strip 1 each by Other route as needed for other. For one touch ultra meter. DX: E11.9 02/20/19  Yes Elby Beck, FNP  ?insulin aspart protamine - aspart (NOVOLOG MIX 70/30 FLEXPEN) (70-30) 100 UNIT/ML FlexPen Inject 50 units before breakfast and 70 units before dinner 07/09/20  Yes [provider]  ?Insulin Pen Needle 32G  X 4 MM MISC Use to inject Insulin daily. Dx: E11.9 01/05/18  Yes Elby Beck, FNP  ?Insulin Syringe-Needle U-100 (INSULIN SYRINGE .3CC/31GX5/16") 31G X 5/16" 0.3 ML MISC 1 Units by Does not apply route 2 (two) times daily. 05/07/20  Yes Elby Beck, FNP  ?JARDIANCE 25 MG TABS tablet Take 25 mg by mouth daily. 10/22/20  Yes [provider]  ?Lancets Glory Rosebush ULTRASOFT) lancets Use as instructed 01/05/18  Yes Elby Beck, FNP  ?metoCLOPramide (REGLAN) 5 MG tablet Take 1 tablet (5 mg total) by mouth daily as needed for nausea. 01/08/22  Yes Derrick Lame, MD  ?metoprolol succinate (TOPROL-XL) 100 MG 24 hr tablet Take 100 mg by mouth daily. 04/17/21  Yes [provider]  ?sacubitril-valsartan (ENTRESTO) 24-26 MG Take 0.5 tablets by mouth 2 (two) times daily. 05/27/20  Yes [provider]  ?sitaGLIPtin (JANUVIA) 100 MG tablet Take 1 tablet by mouth daily. 12/01/21  Yes [provider]  ?traZODone (DESYREL) 50 MG tablet Take by mouth. 10/02/21 02/12/22 Yes [provider]  ?nitroGLYCERIN (NITROSTAT) 0.4 MG SL tablet Place 1 tablet (0.4 mg total) under the tongue every 5 (five) minutes as needed for chest pain. 04/20/18 07/19/18  End, Harrell Gave, MD  ?Annitta Needs ointment Apply 1 application topically daily. ?Patient not taking: Reported on 02/12/2022 09/15/21   [provider]  ?Varenicline Tartrate, Starter, 0.5 MG X 11 & 1 MG X 42 TBPK  10/27/21   [provider]  ? ? ?Allergies as of 01/09/2022 - Review Complete 01/08/2022  ?Allergen Reaction Noted  ? Latex Rash 06/16/2016  ? ? ?Family History  ?Problem Relation Age of Onset  ? Hypertension Mother   ? Hypertension Father   ? Sudden death Father 24  ?     at age of death  ? Diabetes Father   ? Heart attack Father   ? Heart attack Sister   ? Cancer Maternal Aunt   ?     breast x2 aunts  ? ? ?Social History  ? ?Socioeconomic History  ? Marital status: Married  ?  Spouse name: Not on file  ? Number of children:  Not on file  ? Years of education: Not on file  ? Highest education level: Not on file  ?Occupational History  ? 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 Smoking status: Every Day  ?  Packs/day: 1.50  ?  Years: 42.00  ?  Pack years: 63.00  ?  Types: Cigarettes  ?  Last attempt to quit: 09/25/2019  ?  Years since quitting: 2.3  ? Smokeless tobacco: Never  ?Substance and Sexual Activity  ? Alcohol use: No  ? Drug use: No  ? Sexual activity: Not on file  ?Other Topics Concern  ? Not on file  ?Social History Narrative  ? Not on file  ? ?Social Determinants of Health  ? ?Financial  Resource Strain: Not on file  ?Food Insecurity: Not on file  ?Transportation Needs: Not on file  ?Physical Activity: Not on file  ?Stress: Not on file  ?Social Connections: Not on file  ?Intimate Partner Violence: Not on file  ? ? ?Review of Systems: ?See HPI, otherwise negative ROS ? ?Physical Exam: ?BP 138/73   Pulse 73   Temp 97.6 ?F (36.4 ?C) (Temporal)   Resp 18   Ht '5\' 5"'$  (1.651 m)   Wt 77.1 kg   SpO2 96%   BMI 28.29 kg/m?  ?General:   Alert,  pleasant and cooperative in NAD ?Head:  Normocephalic and atraumatic. ?Neck:  Supple; no masses or thyromegaly. ?Lungs:  Clear throughout to auscultation.    ?Heart:  Regular rate and rhythm. ?Abdomen:  Soft, nontender and nondistended. Normal bowel sounds, without guarding, and without rebound.   ?Neurologic:  Alert and  oriented x4;  grossly normal neurologically. ? ?Impression/Plan: ?ZUHAYR DEENEY is now here to undergo a screening colonoscopy. ? ?Risks, benefits, and alternatives regarding colonoscopy have been reviewed with the patient.  Questions have been answered.  All parties agreeable. ?

## 2022-02-12 NOTE — Anesthesia Postprocedure Evaluation (Signed)
Anesthesia Post Note ? ?Patient: [name] ? ?Procedure(s) Performed: COLONOSCOPY WITH PROPOFOL ? ?Anesthesia Type: General ?Level of consciousness: lethargic ?Anesthetic complications: no ? ? ?No notable events documented. ? ? ?Last Vitals:  ?Vitals:  ? 02/12/22 0951  ?BP: 138/73  ?Pulse: 73  ?Resp: 18  ?Temp: 36.4 ?C  ?SpO2: 96%  ?  ?Last Pain:  ?Vitals:  ? 02/12/22 0951  ?TempSrc: Temporal  ?PainSc: 0-No pain  ? ? ?  ?  ?  ?  ?  ?  ? ?COOK-MARTIN,Lillionna Nabi ? ? ? ? ?

## 2022-02-12 NOTE — Transfer of Care (Signed)
Immediate Anesthesia Transfer of Care Note ? ?Patient: [name] ? ?Procedure(s) Performed: COLONOSCOPY WITH PROPOFOL ? ?Patient Location: PACU ? ?Anesthesia Type:General ? ?Level of Consciousness: awake and sedated ? ?Airway & Oxygen Therapy: Patient Spontanous Breathing and Patient connected to nasal cannula oxygen ? ?Post-op Assessment: Report given to RN and Post -op Vital signs reviewed and stable ? ?Post vital signs: Reviewed and stable ? ?Last Vitals:  ?Vitals Value Taken Time  ?BP    ?Temp    ?Pulse    ?Resp    ?SpO2    ? ? ?Last Pain:  ?Vitals:  ? 02/12/22 0951  ?TempSrc: Temporal  ?PainSc: 0-No pain  ?   ? ?  ? ?Complications: No notable events documented. ?

## 2022-02-12 NOTE — Anesthesia Preprocedure Evaluation (Addendum)
Anesthesia Evaluation  ?Patient identified by MRN, date of birth, ID band ?Patient awake ? ? ? ?Reviewed: ?Allergy & Precautions, NPO status , Patient's Chart, lab work & pertinent test results ? ?Airway ?Mallampati: II ? ?TM Distance: >3 FB ?Neck ROM: Full ? ? ? Dental ? ?(+) Poor Dentition, Missing, Chipped, Dental Advisory Given ?  ?Pulmonary ?neg pulmonary ROS, Current Smoker and Patient abstained from smoking.,  ?  ?Pulmonary exam normal ? ?+ decreased breath sounds ? ? ? ? ? Cardiovascular ?Exercise Tolerance: Poor ?hypertension, Pt. on medications ?+ CAD, + Past MI and +CHF  ?negative cardio ROS ?Normal cardiovascular exam+ dysrhythmias Atrial Fibrillation  ?Rhythm:Regular Rate:Normal ? ? ?  ?Neuro/Psych ?Anxiety negative neurological ROS ? negative psych ROS  ? GI/Hepatic ?negative GI ROS, Neg liver ROS,   ?Endo/Other  ?negative endocrine ROSdiabetes, Poorly Controlled, Type 1, Insulin Dependent ? Renal/GU ?negative Renal ROS  ?negative genitourinary ?  ?Musculoskeletal ?negative musculoskeletal ROS ?(+)  ? Abdominal ?Normal abdominal exam  (+)   ?Peds ?negative pediatric ROS ?(+)  Hematology ?negative hematology ROS ?(+)   ?Anesthesia Other Findings ?Past Medical History: ?L FOREARM: Abrasion ?No date: Atrial fibrillation ([place]) ?    Comment:  a. initially occurring in post-op setting in 05/2016,  ?             started on Eliquis b. 08/2016: s/p clipping of atrial  ?             appendage at time of CABG --> Eliquis switched to  ?             Coumadin ?No date: CAD (coronary artery disease) ?    Comment:  a. 05/2016: NSTEMI, cath showing 3v disease with CABG  ?             recommended once MRSA infection resolved. b. 08/2016:  ?             CABG with LIMA-LAD, SVG-D1, and SVG-RI. ?No date: Cancer [facility]) ?    Comment:  basal cell carcinoma of the skin ?No date: Diabetes mellitus without complication ([place]) ?No date: Headache(784.0) ?"SINCE I WAS A KID": Hematuria,  microscopic ?No date: Hyperlipidemia ?No date: Hypertension ?No date: Ischemic cardiomyopathy ?    Comment:  a. 05/2016: echo w/ EF of 25-35% and akinesis of the  ?             basal-midinferolateral and inferior myocardium. ?No date: Lactose intolerance ?No date: Myocardial infarction [facility]) ?No date: Respiratory failure ([place]) ?RIGHT: Rotator cuff tear ?No date: Tobacco use ?    Comment:  a. quit in 05/2016 following MI ? ?Past Surgical History: ?02/06/2020: AMPUTATION TOE; Left ?    Comment:  5th ray amputation with graph [facility]) ?06/16/2016: CARDIAC CATHETERIZATION; N/A ?    Comment:  Procedure: Left Heart Cath and Coronary Angiography;   ?             Surgeon: Troy Sine, MD;  Location: [place] CV  ?             LAB;  Service: Cardiovascular;  Laterality: N/A; ?08/24/2016: CLIPPING OF ATRIAL APPENDAGE; N/A ?    Comment:  Procedure: CLIPPING OF ATRIAL APPENDAGE;  Surgeon: Collier Salina ?             Prescott Gum, MD;  Location: [place];  Service: Open Heart  ?             Surgery;  Laterality: N/A; ?08/24/2016: CORONARY ARTERY BYPASS  GRAFT; N/A ?    Comment:  Procedure: CORONARY ARTERY BYPASS GRAFTING (CABG) x  ?             three,  using left internal mammary artery and right leg  ?             greater saphenous vein harvested endoscopically;   ?             Surgeon: Ivin Poot, MD;  Location: [place];  Service: ?             Open Heart Surgery;  Laterality: N/A; ?X2: HERNIA REPAIR ?06/10/2016: INCISION AND DRAINAGE ABSCESS; Left ?    Comment:  Procedure: INCISION AND DRAINAGE LEFT AXILLARY ABSCESS;  ?             Surgeon: Autumn Messing III, MD;  [facility];  Service:  ?             General;  Laterality: Left; ?No date: ROTATOR CUFF REPAIR; Bilateral ?08/24/2016: TEE WITHOUT CARDIOVERSION; N/A ?    Comment:  Procedure: TRANSESOPHAGEAL ECHOCARDIOGRAM (TEE);   ?             Surgeon: Ivin Poot, MD;  Location: [place];  Service: ?             Open Heart Surgery;  Laterality: N/A; ? ?BMI   ? Body Mass Index: 28.29  kg/m?  ?  ? ? Reproductive/Obstetrics ?negative OB ROS ? ?  ? ? ? ? ? ? ? ? ? ? ? ? ? ?  ?  ? ? ? ? ? ? ? ? ?Anesthesia Physical ?Anesthesia Plan ? ?ASA: 3 ? ?Anesthesia Plan: General  ? ?Post-op Pain Management:   ? ?Induction:  ? ?PONV Risk Score and Plan: Propofol infusion and TIVA ? ?Airway Management Planned:  ? ?Additional Equipment:  ? ?Intra-op Plan:  ? ?Post-operative Plan:  ? ?Informed Consent: I have reviewed the patients History and Physical, chart, labs and discussed the procedure including the risks, benefits and alternatives for the proposed anesthesia with the patient or authorized representative who has indicated his/her understanding and acceptance.  ? ? ? ?Dental Advisory Given ? ?Plan Discussed with: Anesthesiologist, CRNA and Surgeon ? ?Anesthesia Plan Comments:   ? ? ? ? ? ?Anesthesia Quick Evaluation ? ?

## 2022-02-12 NOTE — Op Note (Signed)
[facility] ?Gastroenterology ?Patient Name: [name] ?Procedure Date: 02/12/2022 11:02 AM ?MRN: [id number] ?Account #: [id number] ?Date of Birth: Dec 23, 1960 ?Admit Type: Outpatient ?Age: 61 ?Room: [facility] ENDO ROOM 4 ?Gender: Male ?Note Status: Finalized ?Instrument Name: Colonoscope [id number] ?Procedure:             Colonoscopy ?Indications:           Screening for colorectal malignant neoplasm ?Providers:             Lucilla Lame MD, MD ?Referring MD:          Alyson Locket. Charmian Muff (Referring MD) ?Medicines:             Propofol per Anesthesia ?Complications:         No immediate complications. ?Procedure:             Pre-Anesthesia Assessment: ?                       - Prior to the procedure, a History and Physical was  ?                       performed, and patient medications and allergies were  ?                       reviewed. The patient's tolerance of previous  ?                       anesthesia was also reviewed. The risks and benefits  ?                       of the procedure and the sedation options and risks  ?                       were discussed with the patient. All questions were  ?                       answered, and informed consent was obtained. Prior  ?                       Anticoagulants: The patient has taken no previous  ?                       anticoagulant or antiplatelet agents. ASA Grade  ?                       Assessment: III - A patient with severe systemic  ?                       disease. After reviewing the risks and benefits, the  ?                       patient was deemed in satisfactory condition to  ?                       undergo the procedure. ?                       After obtaining informed consent, the colonoscope was  ?  passed under direct vision. Throughout the procedure,  ?                       the patient's blood pressure, pulse, and oxygen  ?                       saturations were monitored continuously. The  ?                        Colonoscope was introduced through the anus and  ?                       advanced to the the cecum, identified by appendiceal  ?                       orifice and ileocecal valve. The colonoscopy was  ?                       performed without difficulty. The patient tolerated  ?                       the procedure well. The quality of the bowel  ?                       preparation was excellent. ?Findings: ?     The perianal and digital rectal examinations were normal. ?     A 12 mm polyp was found in the ascending colon. The polyp was sessile.  ?     Preparations were made for mucosal resection. Chromoscopy with indigo  ?     carmine was done to mark the borders of the lesion. Saline with indigo  ?     carmine was injected to raise the lesion. Snare mucosal resection was  ?     performed. Resection and retrieval were complete. To prevent bleeding  ?     post-intervention, one hemostatic clip was successfully placed (MR  ?     conditional). There was no bleeding at the end of the procedure. ?     Three sessile polyps were found in the ascending colon. The polyps were  ?     4 to 7 mm in size. These polyps were removed with a cold snare.  ?     Resection and retrieval were complete. ?     Three sessile polyps were found in the transverse colon. The polyps were  ?     4 to 7 mm in size. These polyps were removed with a cold snare.  ?     Resection and retrieval were complete. ?     Two sessile polyps were found in the descending colon. The polyps were 5  ?     to 6 mm in size. These polyps were removed with a cold snare. Resection  ?     and retrieval were complete. ?     A 5 mm polyp was found in the rectum. The polyp was sessile. The polyp  ?     was removed with a cold snare. Resection and retrieval were complete. ?     Non-bleeding internal hemorrhoids were found during retroflexion. The  ?     hemorrhoids were Grade II (internal hemorrhoids that prolapse but reduce  ?     spontaneously). ?Impression:            -  One 12 mm polyp in the ascending colon, removed with  ?                       mucosal resection. Resected and retrieved. Clip (MR  ?                       conditional) was placed. ?                       - Three 4 to 7 mm polyps in the ascending colon,  ?                       removed with a cold snare. Resected and retrieved. ?                       - Three 4 to 7 mm polyps in the transverse colon,  ?                       removed with a cold snare. Resected and retrieved. ?                       - Two 5 to 6 mm polyps in the descending colon,  ?                       removed with a cold snare. Resected and retrieved. ?                       - One 5 mm polyp in the rectum, removed with a cold  ?                       snare. Resected and retrieved. ?                       - Non-bleeding internal hemorrhoids. ?                       - Mucosal resection was performed. Resection and  ?                       retrieval were complete. ?Recommendation:        - Discharge patient to home. ?                       - Resume previous diet. ?                       - Continue present medications. ?                       - Await pathology results. ?                       - Repeat colonoscopy in 1 year for surveillance. ?Procedure Code(s):     --- Professional --- ?                       [phone], Colonoscopy, flexible; with endoscopic mucosal  ?  resection ?                       00762, 59, Colonoscopy, flexible; with removal of  ?                       tumor(s), polyp(s), or other lesion(s) by snare  ?                       technique ?Diagnosis Code(s):     --- Professional --- ?                       Z12.11, Encounter for screening for malignant neoplasm  ?                       of colon ?                       K63.5, Polyp of colon ?                       K62.1, Rectal polyp ?CPT copyright 2019 [employer] Medical Association. All rights reserved. ?The codes documented in this report are preliminary and upon  coder review may  ?be revised to meet current compliance requirements. ?Lucilla Lame MD, MD ?02/12/2022 11:55:12 AM ?This report has been signed electronically. ?Number of Addenda: 0 ?Note Initiated On: 02/12/2022 11:02 AM ?Scope Withdrawal Time: 0 hours 25 minutes 10 seconds  ?Total Procedure Duration: 0 hours 31 minutes 22 seconds  ?Estimated Blood Loss:  Estimated blood loss: none. ?     [facility] ?

## 2022-02-13 ENCOUNTER — Encounter: Payer: Self-pay | Admitting: Gastroenterology

## 2022-02-13 LAB — SURGICAL PATHOLOGY

## 2022-02-13 NOTE — Anesthesia Postprocedure Evaluation (Signed)
Anesthesia Post Note ? ?Patient: [name] ? ?Procedure(s) Performed: COLONOSCOPY WITH PROPOFOL ? ?Patient location during evaluation: PACU ?Anesthesia Type: General ?Level of consciousness: awake and awake and alert ?Pain management: satisfactory to patient ?Vital Signs Assessment: post-procedure vital signs reviewed and stable ?Respiratory status: spontaneous breathing and respiratory function stable ?Cardiovascular status: stable ?Anesthetic complications: no ? ? ?No notable events documented. ? ? ?Last Vitals:  ?Vitals:  ? 02/12/22 1217 02/12/22 1227  ?BP: 120/65 120/68  ?Pulse:    ?Resp:    ?Temp:    ?SpO2:    ?  ?Last Pain:  ?Vitals:  ? 02/12/22 1227  ?TempSrc:   ?PainSc: 0-No pain  ? ? ?  ?  ?  ?  ?  ?  ? ?VAN STAVEREN,Danyla Wattley ? ? ? ? ?

## 2022-02-16 ENCOUNTER — Encounter: Payer: Self-pay | Admitting: Gastroenterology

## 2022-02-16 DIAGNOSIS — M5431 Sciatica, right side: Secondary | ICD-10-CM | POA: Diagnosis not present

## 2022-02-19 DIAGNOSIS — G4733 Obstructive sleep apnea (adult) (pediatric): Secondary | ICD-10-CM | POA: Diagnosis not present

## 2022-02-27 DIAGNOSIS — E113412 Type 2 diabetes mellitus with severe nonproliferative diabetic retinopathy with macular edema, left eye: Secondary | ICD-10-CM | POA: Diagnosis not present

## 2022-03-02 DIAGNOSIS — Z794 Long term (current) use of insulin: Secondary | ICD-10-CM | POA: Diagnosis not present

## 2022-03-02 DIAGNOSIS — E1165 Type 2 diabetes mellitus with hyperglycemia: Secondary | ICD-10-CM | POA: Diagnosis not present

## 2022-03-05 ENCOUNTER — Ambulatory Visit (INDEPENDENT_AMBULATORY_CARE_PROVIDER_SITE_OTHER): Payer: Medicare Other | Admitting: Nurse Practitioner

## 2022-03-05 ENCOUNTER — Encounter: Payer: Self-pay | Admitting: Nurse Practitioner

## 2022-03-05 VITALS — BP 118/72 | HR 68 | Temp 97.0°F | Resp 16 | Ht 65.0 in | Wt 170.5 lb

## 2022-03-05 DIAGNOSIS — I10 Essential (primary) hypertension: Secondary | ICD-10-CM | POA: Diagnosis not present

## 2022-03-05 DIAGNOSIS — Z794 Long term (current) use of insulin: Secondary | ICD-10-CM | POA: Diagnosis not present

## 2022-03-05 DIAGNOSIS — L97329 Non-pressure chronic ulcer of left ankle with unspecified severity: Secondary | ICD-10-CM

## 2022-03-05 DIAGNOSIS — Z720 Tobacco use: Secondary | ICD-10-CM | POA: Diagnosis not present

## 2022-03-05 DIAGNOSIS — J069 Acute upper respiratory infection, unspecified: Secondary | ICD-10-CM

## 2022-03-05 DIAGNOSIS — E1169 Type 2 diabetes mellitus with other specified complication: Secondary | ICD-10-CM

## 2022-03-05 MED ORDER — AMOXICILLIN-POT CLAVULANATE 875-125 MG PO TABS: 1.0000 | ORAL_TABLET | Freq: Two times a day (BID) | ORAL | 0 refills | Status: AC

## 2022-03-05 NOTE — Progress Notes (Signed)
Established Patient Office Visit  Subjective   Patient ID: [name], male    DOB: [birth year]  Age: 61 y.o. MRN: [id number]  Chief Complaint  Patient presents with   Follow-up   Cough    Over a week ago, post nasal drip, stuffy nose, nasal congested, some SOB. Coughing up yellow phlegm. No fever. No sore throat.       DM2: states that they took him off the  ozempic helped with the nausea and vomiting. Having low readings. Lows of 45 consistently  Tobacco use: states that he is still smoking. Will smoke about 2 packs a day.  States that he did try the Chantix medication without great relief states he did not do anything for him.  Has been seen at the wound care clinic. Had venous ulcers that were healed and then had a burn that was healed then another spot that developed and has been healed.  Patient graduated from wound clinic in March 2023.  Patient now has a venous ulcer to the left lower lateral ankle.  Congestion: for over a week aprrox 10 days. States mom had the same thing and was treated with abx and she was covid negative.      03/05/2022    3:54 PM 10/29/2021   10:58 AM 07/29/2021   11:50 AM  PHQ9 SCORE ONLY  PHQ-9 Total Score '14 12 10      '$ 03/05/2022    3:54 PM 10/29/2021   10:58 AM 07/29/2021   11:51 AM  GAD 7 : Generalized Anxiety Score  Nervous, Anxious, on Edge '2 1 1  '$ Control/stop worrying '3 2 3  '$ Worry too much - different things '3 2 3  '$ Trouble relaxing '2 1 1  '$ Restless 0 0 0  Easily annoyed or irritable 2 0 1  Afraid - awful might happen '2 1 1  '$ Total GAD 7 Score '14 7 10  '$ Anxiety Difficulty Somewhat difficult Somewhat difficult Not difficult at all       Review of Systems  Constitutional:  Negative for chills and fever.  HENT:  Positive for congestion and sinus pain. Negative for ear discharge, ear pain and sore throat.   Respiratory:  Positive for cough (yellow) and shortness of breath (baseline).   Cardiovascular:  Negative for chest pain.   Gastrointestinal:  Positive for diarrhea. Negative for nausea and vomiting.     Objective:     BP 118/72   Pulse 68   Temp (!) 97 F (36.1 C)   Resp 16   Ht '5\' 5"'$  (1.651 m)   Wt 170 lb 8 oz (77.3 kg)   SpO2 95%   BMI 28.37 kg/m    Physical Exam Vitals and nursing note reviewed.  Constitutional:      Appearance: Normal appearance.  HENT:     Right Ear: Tympanic membrane, ear canal and external ear normal.     Left Ear: Tympanic membrane, ear canal and external ear normal.     Nose:     Right Sinus: No maxillary sinus tenderness or frontal sinus tenderness.     Left Sinus: No maxillary sinus tenderness or frontal sinus tenderness.     Mouth/Throat:     Mouth: Mucous membranes are moist.     Pharynx: Oropharynx is clear.  Cardiovascular:     Rate and Rhythm: Normal rate and regular rhythm.     Heart sounds: Normal heart sounds.  Pulmonary:     Effort: Pulmonary effort is normal.  Breath sounds: Normal breath sounds.  Abdominal:     General: Bowel sounds are normal.  Musculoskeletal:     Right lower leg: No edema.     Left lower leg: No edema.  Skin:    General: Skin is warm.     Findings: Lesion present.  Neurological:     Mental Status: He is alert.      No results found for any visits on 03/05/22.    The ASCVD Risk score (Arnett DK, et al., 2019) failed to calculate for the following reasons:   The patient has a prior MI or stroke diagnosis    Assessment & Plan:   Problem List Items Addressed This Visit       Cardiovascular and Mediastinum   Essential hypertension    Blood pressure within the office today.  continue taking medication as prescribed         Respiratory   Upper respiratory tract infection - Primary    Given patient having cough with slight increase shortness of breath and increased sputum production and purulence we will treat as a COPD exacerbation.  Augmentin 875-125 mg twice daily for 7 days follow-up if no improvement        Relevant Medications   amoxicillin-clavulanate (AUGMENTIN) 875-125 MG tablet     Endocrine   Type 2 diabetes mellitus with other specified complication ([place])    Is followed by endocrinology.  Recently got started on a CGM and has been having several episodes of hypoglycemia.  We did review methodologies order to increase his glucose and rapid fashion and food choices such as protein to maintain his blood glucose once he gets it in a safe range.  Patient to continue monitoring this with CGM and follow-up with endocrinology as recommended       Relevant Medications   glipiZIDE (GLUCOTROL XL) 2.5 MG 24 hr tablet   insulin isophane & regular human KwikPen (HUMULIN 70/30 KWIKPEN) (70-30) 100 UNIT/ML KwikPen   Other Relevant Orders   CBC   Comprehensive metabolic panel     Musculoskeletal and Integument   Ulcer of left ankle ([place])    See clinical photo.  Patient has ulcer to the left lateral ankle and the bony prominence.  Patient established with wound clinic recommend him calling wound clinic to get set back up.  Patient likely has peripheral artery disease given his other comorbidities and continual smoking.  PMS intact no signs or concerns of infection currently         Other   Tobacco abuse    Patient did try Chantix medication but failed.  States did not do anything for him currently smoking 2 packs a day        Return in about 4 months (around 07/06/2022) for Recheck chronic conditions.    Romilda Garret, NP

## 2022-03-05 NOTE — Assessment & Plan Note (Signed)
See clinical photo.  Patient has ulcer to the left lateral ankle and the bony prominence.  Patient established with wound clinic recommend him calling wound clinic to get set back up.  Patient likely has peripheral artery disease given his other comorbidities and continual smoking.  PMS intact no signs or concerns of infection currently

## 2022-03-05 NOTE — Assessment & Plan Note (Signed)
Is followed by endocrinology.  Recently got started on a CGM and has been having several episodes of hypoglycemia.  We did review methodologies order to increase his glucose and rapid fashion and food choices such as protein to maintain his blood glucose once he gets it in a safe range.  Patient to continue monitoring this with CGM and follow-up with endocrinology as recommended

## 2022-03-05 NOTE — Patient Instructions (Signed)
Nice  see you today I want to see you in 4 months, sooner if you need me Follow up sooner if you need me  Go ahead and contact the wound clinic about the ulcer on the ankle

## 2022-03-05 NOTE — Assessment & Plan Note (Signed)
Given patient having cough with slight increase shortness of breath and increased sputum production and purulence we will treat as a COPD exacerbation.  Augmentin 875-125 mg twice daily for 7 days follow-up if no improvement

## 2022-03-05 NOTE — Assessment & Plan Note (Signed)
Blood pressure within the office today.  continue taking medication as prescribed

## 2022-03-05 NOTE — Assessment & Plan Note (Signed)
Patient did try Chantix medication but failed.  States did not do anything for him currently smoking 2 packs a day

## 2022-03-06 LAB — CBC
HCT: 47.4 % (ref 39.0–52.0)
Hemoglobin: 15.4 g/dL (ref 13.0–17.0)
MCHC: 32.6 g/dL (ref 30.0–36.0)
MCV: 87 fl (ref 78.0–100.0)
Platelets: 137 10*3/uL — ABNORMAL LOW (ref 150.0–400.0)
RBC: 5.45 Mil/uL (ref 4.22–5.81)
RDW: 15.9 % — ABNORMAL HIGH (ref 11.5–15.5)
WBC: 7.7 10*3/uL (ref 4.0–10.5)

## 2022-03-06 LAB — COMPREHENSIVE METABOLIC PANEL
ALT: 10 U/L (ref 0–53)
AST: 16 U/L (ref 0–37)
Albumin: 4 g/dL (ref 3.5–5.2)
Alkaline Phosphatase: 119 U/L — ABNORMAL HIGH (ref 39–117)
BUN: 19 mg/dL (ref 6–23)
CO2: 27 mEq/L (ref 19–32)
Calcium: 8.5 mg/dL (ref 8.4–10.5)
Chloride: 110 mEq/L (ref 96–112)
Creatinine, Ser: 1.51 mg/dL — ABNORMAL HIGH (ref 0.40–1.50)
GFR: 49.81 mL/min — ABNORMAL LOW (ref >60.00)
Glucose, Bld: 130 mg/dL — ABNORMAL HIGH (ref 70–99)
Potassium: 4.3 mEq/L (ref 3.5–5.1)
Sodium: 145 mEq/L (ref 135–145)
Total Bilirubin: 0.5 mg/dL (ref 0.2–1.2)
Total Protein: 6.8 g/dL (ref 6.0–8.3)

## 2022-03-12 DIAGNOSIS — E119 Type 2 diabetes mellitus without complications: Secondary | ICD-10-CM | POA: Diagnosis not present

## 2022-03-22 DIAGNOSIS — G4733 Obstructive sleep apnea (adult) (pediatric): Secondary | ICD-10-CM | POA: Diagnosis not present

## 2022-03-27 DIAGNOSIS — E113412 Type 2 diabetes mellitus with severe nonproliferative diabetic retinopathy with macular edema, left eye: Secondary | ICD-10-CM | POA: Diagnosis not present

## 2022-04-11 DIAGNOSIS — E119 Type 2 diabetes mellitus without complications: Secondary | ICD-10-CM | POA: Diagnosis not present

## 2022-04-13 ENCOUNTER — Ambulatory Visit (INDEPENDENT_AMBULATORY_CARE_PROVIDER_SITE_OTHER): Payer: Medicare Other | Admitting: Nurse Practitioner

## 2022-04-13 ENCOUNTER — Encounter: Payer: Self-pay | Admitting: Nurse Practitioner

## 2022-04-13 VITALS — BP 120/80 | HR 64 | Temp 98.8°F | Wt 169.0 lb

## 2022-04-13 DIAGNOSIS — R2689 Other abnormalities of gait and mobility: Secondary | ICD-10-CM | POA: Diagnosis not present

## 2022-04-13 NOTE — Assessment & Plan Note (Signed)
Had an episode of imbalance that lasted for a day.  This is concerning in regards to possible TIA.  Symptoms self resolved he has not had any trouble since that was a week ago.  Patient is diabetic, current smoker, and has peripheral vascular disease, and paroxysmal atrial fibrillation.  Patient patient in a high risk for stroke.  Did discuss this with him and his daughter who is at bedside.  We did elect to obtain a CT head to rule out an an old infarct.  Pending results of CT scan.  Did discuss signs and symptoms with patient and daughter as when to seek emergent healthcare.

## 2022-04-17 ENCOUNTER — Telehealth: Payer: Self-pay

## 2022-04-17 ENCOUNTER — Other Ambulatory Visit: Payer: Self-pay | Admitting: Nurse Practitioner

## 2022-04-17 ENCOUNTER — Ambulatory Visit
Admission: RE | Admit: 2022-04-17 | Discharge: 2022-04-17 | Disposition: A | Discharge status: Home / Self Care | Payer: Medicare Other | Source: Ambulatory Visit | Attending: Nurse Practitioner | Admitting: Nurse Practitioner

## 2022-04-17 DIAGNOSIS — R2689 Other abnormalities of gait and mobility: Secondary | ICD-10-CM | POA: Diagnosis not present

## 2022-04-17 DIAGNOSIS — G459 Transient cerebral ischemic attack, unspecified: Secondary | ICD-10-CM | POA: Diagnosis not present

## 2022-04-17 NOTE — Telephone Encounter (Signed)
Received call report for CT HEAD results from [facility] with [place]: No acute intracranial abnormality.

## 2022-04-21 DIAGNOSIS — G4733 Obstructive sleep apnea (adult) (pediatric): Secondary | ICD-10-CM | POA: Diagnosis not present

## 2022-04-22 DIAGNOSIS — H401131 Primary open-angle glaucoma, bilateral, mild stage: Secondary | ICD-10-CM | POA: Diagnosis not present

## 2022-05-05 DIAGNOSIS — E119 Type 2 diabetes mellitus without complications: Secondary | ICD-10-CM | POA: Diagnosis not present

## 2022-05-08 DIAGNOSIS — E113412 Type 2 diabetes mellitus with severe nonproliferative diabetic retinopathy with macular edema, left eye: Secondary | ICD-10-CM | POA: Diagnosis not present

## 2022-05-12 ENCOUNTER — Ambulatory Visit: Payer: Medicare Other | Admitting: Podiatry

## 2022-05-15 ENCOUNTER — Ambulatory Visit (INDEPENDENT_AMBULATORY_CARE_PROVIDER_SITE_OTHER): Payer: Medicare Other | Admitting: Podiatry

## 2022-05-15 ENCOUNTER — Encounter: Payer: Self-pay | Admitting: Podiatry

## 2022-05-15 DIAGNOSIS — L89520 Pressure ulcer of left ankle, unstageable: Secondary | ICD-10-CM

## 2022-05-15 DIAGNOSIS — Z89422 Acquired absence of other left toe(s): Secondary | ICD-10-CM

## 2022-05-15 DIAGNOSIS — M79609 Pain in unspecified limb: Secondary | ICD-10-CM

## 2022-05-15 DIAGNOSIS — E1142 Type 2 diabetes mellitus with diabetic polyneuropathy: Secondary | ICD-10-CM | POA: Diagnosis not present

## 2022-05-15 DIAGNOSIS — B351 Tinea unguium: Secondary | ICD-10-CM | POA: Diagnosis not present

## 2022-05-19 NOTE — Progress Notes (Signed)
  Subjective:  Patient ID: [name], male    DOB: 1961/02/05,  MRN: [id number]  [name] presents to clinic today for at risk foot care. Patient has h/o amputation of digital amputation L 5th ray and painful, discolored, thick toenails which interfere with daily activities  Patient states he has developed pain on outside of left ankle. He states he sleeps on his left side. It has a small scab on the area. He denies any redness, drainage or swelling.  Last A1c was 9.5%.  Last known HgA1c was unknown.    PCP is Michela Pitcher, NP , and last visit was  April 13, 2022  Allergies  Allergen Reactions   Latex Rash    Review of Systems: Negative except as noted in the HPI.  Objective: No changes noted in today's physical examination.  Vascular Examination: Vascular status intact b/l with faintly palpable pedal pulses. Pedal hair absent. CFT immediate b/l. No edema. No pain with calf compression b/l. Skin temperature gradient WNL b/l.   Neurological Examination: Sensation grossly intact b/l with 10 gram monofilament. Vibratory sensation intact b/l.   Dermatological Examination: Pedal skin with normal turgor, texture and tone b/l. Toenails right 5th digit and  1-4 b/l thick, discolored, elongated with subungual debris and pain on dorsal palpation.  Area of pressure noted left lateral malleolus with scab and tenderness to palpation. Area erythematous. No blister formation, no eschar. No fluctuance, no drainage.  Musculoskeletal Examination: Muscle strength 5/5 to b/l LE. Lower extremity amputation(s): partial 5th ray amputation left lower extremity.  Radiographs: None  Last A1c:      07/21/2021   12:00 AM  Hemoglobin A1C  Hemoglobin-A1c 9.5         This result is from an external source.   Assessment/Plan: 1. Pain due to onychomycosis of nail   2. Pressure injury of left ankle, unstageable ([place])   3. History of amputation of lesser toe of left foot ([place])   4. Diabetic  polyneuropathy associated with type 2 diabetes mellitus ([place])      -Patient was evaluated and treated. All patient's and/or POA's questions/concerns answered on today's visit. -Dispensed heel protector for left ankle. Patient/POA signed ABN for DME. -For pressure injury of heel(s), recommended daily use of heel protectors when patient is in bed. Order written for facility to continue daily pressure precautions when patient is in bed. -Mycotic toenails 1-5 right foot and 1-4 left foot were debrided in length and girth with sterile nail nippers and dremel without iatrogenic bleeding. -Patient scheduled to see Dr. Sherryle Lis in two weeks for follow up of left ankle pressure injury. -Patient/POA to call should there be question/concern in the interim.   No follow-ups on file.  Marzetta Board, DPM

## 2022-05-20 DIAGNOSIS — Z8739 Personal history of other diseases of the musculoskeletal system and connective tissue: Secondary | ICD-10-CM | POA: Diagnosis not present

## 2022-05-20 DIAGNOSIS — E559 Vitamin D deficiency, unspecified: Secondary | ICD-10-CM | POA: Diagnosis not present

## 2022-05-20 DIAGNOSIS — Z794 Long term (current) use of insulin: Secondary | ICD-10-CM | POA: Diagnosis not present

## 2022-05-20 DIAGNOSIS — R2689 Other abnormalities of gait and mobility: Secondary | ICD-10-CM | POA: Diagnosis not present

## 2022-05-20 DIAGNOSIS — R202 Paresthesia of skin: Secondary | ICD-10-CM | POA: Diagnosis not present

## 2022-05-20 DIAGNOSIS — E1165 Type 2 diabetes mellitus with hyperglycemia: Secondary | ICD-10-CM | POA: Diagnosis not present

## 2022-05-22 DIAGNOSIS — G4733 Obstructive sleep apnea (adult) (pediatric): Secondary | ICD-10-CM | POA: Diagnosis not present

## 2022-05-26 ENCOUNTER — Other Ambulatory Visit (HOSPITAL_COMMUNITY): Payer: Self-pay | Admitting: Neurology

## 2022-05-26 ENCOUNTER — Other Ambulatory Visit: Payer: Self-pay | Admitting: Neurology

## 2022-05-26 DIAGNOSIS — R2689 Other abnormalities of gait and mobility: Secondary | ICD-10-CM

## 2022-06-04 ENCOUNTER — Ambulatory Visit (INDEPENDENT_AMBULATORY_CARE_PROVIDER_SITE_OTHER): Payer: Medicare Other | Admitting: Podiatry

## 2022-06-04 DIAGNOSIS — L89520 Pressure ulcer of left ankle, unstageable: Secondary | ICD-10-CM

## 2022-06-04 DIAGNOSIS — E119 Type 2 diabetes mellitus without complications: Secondary | ICD-10-CM | POA: Diagnosis not present

## 2022-06-07 NOTE — Progress Notes (Signed)
  Subjective:  Patient ID: MASSIE MEES, male    DOB: August 08, 1961,  MRN: [id number]  Chief Complaint  Patient presents with   Foot Ulcer      3 weeks with Dr. Sherryle Lis follow up pressure injury left ankle. Has heel protector.    61 y.o. male presents with the above complaint. History confirmed with patient.   Objective:  Physical Exam: warm, good capillary refill, no trophic changes or ulcerative lesions, normal DP and PT pulses, and lateral ankle ulcer has healed  Assessment:   1. Pressure injury of left ankle, unstageable ([place])      Plan:  Patient was evaluated and treated and all questions answered.  Ulcer has healed.  Discussed risk of recurrence and monitoring this area.  Discussed pressure on this area and trying to offload is much as possible at all times.  I will see him back as needed if he requires  Return if symptoms worsen or fail to improve.

## 2022-06-10 ENCOUNTER — Ambulatory Visit
Admission: RE | Admit: 2022-06-10 | Discharge: 2022-06-10 | Disposition: A | Discharge status: Home / Self Care | Payer: Medicare Other | Source: Ambulatory Visit | Attending: Neurology | Admitting: Neurology

## 2022-06-10 DIAGNOSIS — J3489 Other specified disorders of nose and nasal sinuses: Secondary | ICD-10-CM | POA: Diagnosis not present

## 2022-06-10 DIAGNOSIS — I619 Nontraumatic intracerebral hemorrhage, unspecified: Secondary | ICD-10-CM | POA: Diagnosis not present

## 2022-06-10 DIAGNOSIS — R2689 Other abnormalities of gait and mobility: Secondary | ICD-10-CM

## 2022-06-10 MED ORDER — GADOBUTROL 1 MMOL/ML IV SOLN
7.0000 mL | Freq: Once | INTRAVENOUS | Status: AC | PRN
  Administered 2022-06-10: 7 mL via INTRAVENOUS

## 2022-06-24 DIAGNOSIS — R200 Anesthesia of skin: Secondary | ICD-10-CM | POA: Diagnosis not present

## 2022-06-25 ENCOUNTER — Other Ambulatory Visit: Payer: Self-pay | Admitting: Nurse Practitioner

## 2022-07-03 DIAGNOSIS — E113412 Type 2 diabetes mellitus with severe nonproliferative diabetic retinopathy with macular edema, left eye: Secondary | ICD-10-CM | POA: Diagnosis not present

## 2022-07-03 DIAGNOSIS — E113511 Type 2 diabetes mellitus with proliferative diabetic retinopathy with macular edema, right eye: Secondary | ICD-10-CM | POA: Diagnosis not present

## 2022-07-03 DIAGNOSIS — H401131 Primary open-angle glaucoma, bilateral, mild stage: Secondary | ICD-10-CM | POA: Diagnosis not present

## 2022-07-03 LAB — HM DIABETES EYE EXAM

## 2022-07-04 DIAGNOSIS — E119 Type 2 diabetes mellitus without complications: Secondary | ICD-10-CM | POA: Diagnosis not present

## 2022-07-06 ENCOUNTER — Ambulatory Visit: Payer: Medicare Other | Admitting: Nurse Practitioner

## 2022-07-13 DIAGNOSIS — Z794 Long term (current) use of insulin: Secondary | ICD-10-CM | POA: Diagnosis not present

## 2022-07-13 DIAGNOSIS — Z7984 Long term (current) use of oral hypoglycemic drugs: Secondary | ICD-10-CM | POA: Diagnosis not present

## 2022-07-13 DIAGNOSIS — F17210 Nicotine dependence, cigarettes, uncomplicated: Secondary | ICD-10-CM | POA: Diagnosis not present

## 2022-07-13 DIAGNOSIS — Z79899 Other long term (current) drug therapy: Secondary | ICD-10-CM | POA: Diagnosis not present

## 2022-07-13 DIAGNOSIS — E1165 Type 2 diabetes mellitus with hyperglycemia: Secondary | ICD-10-CM | POA: Diagnosis not present

## 2022-07-13 DIAGNOSIS — E1100 Type 2 diabetes mellitus with hyperosmolarity without nonketotic hyperglycemic-hyperosmolar coma (NKHHC): Secondary | ICD-10-CM | POA: Diagnosis not present

## 2022-07-13 DIAGNOSIS — I252 Old myocardial infarction: Secondary | ICD-10-CM | POA: Diagnosis not present

## 2022-07-13 DIAGNOSIS — I509 Heart failure, unspecified: Secondary | ICD-10-CM | POA: Diagnosis not present

## 2022-07-13 DIAGNOSIS — Z7982 Long term (current) use of aspirin: Secondary | ICD-10-CM | POA: Diagnosis not present

## 2022-07-13 LAB — HEMOGLOBIN A1C: Hemoglobin A1C: 7

## 2022-07-15 ENCOUNTER — Encounter: Payer: Self-pay | Admitting: Nurse Practitioner

## 2022-07-15 ENCOUNTER — Ambulatory Visit (INDEPENDENT_AMBULATORY_CARE_PROVIDER_SITE_OTHER): Payer: Medicare Other | Admitting: Nurse Practitioner

## 2022-07-15 VITALS — BP 134/62 | HR 70 | Temp 96.4°F | Resp 14 | Ht 65.0 in | Wt 171.5 lb

## 2022-07-15 DIAGNOSIS — E1169 Type 2 diabetes mellitus with other specified complication: Secondary | ICD-10-CM | POA: Diagnosis not present

## 2022-07-15 DIAGNOSIS — J069 Acute upper respiratory infection, unspecified: Secondary | ICD-10-CM

## 2022-07-15 DIAGNOSIS — R2689 Other abnormalities of gait and mobility: Secondary | ICD-10-CM

## 2022-07-15 DIAGNOSIS — I10 Essential (primary) hypertension: Secondary | ICD-10-CM | POA: Diagnosis not present

## 2022-07-15 DIAGNOSIS — Z720 Tobacco use: Secondary | ICD-10-CM | POA: Diagnosis not present

## 2022-07-15 DIAGNOSIS — Z794 Long term (current) use of insulin: Secondary | ICD-10-CM

## 2022-07-15 MED ORDER — DOXYCYCLINE HYCLATE 100 MG PO TABS: 100.0000 mg | ORAL_TABLET | Freq: Two times a day (BID) | ORAL | 0 refills | Status: AC

## 2022-07-15 NOTE — Assessment & Plan Note (Signed)
Patient has been seen by neurology.  They did perform an MRI of the brain which was normal.  He does have a follow-up coming up to review all testing and have further insight.  Patient has not had many falls but encouraged him to get a cane when he is out and about with back into hold to for support to prevent him from falling.  Follow-up with neurology as recommended.

## 2022-07-15 NOTE — Progress Notes (Signed)
Established Patient Office Visit  Subjective   Patient ID: [name], male    DOB: [birth year]  Age: 61 y.o. MRN: [id number]  Chief Complaint  Patient presents with   follow up 4 months on chronic condition    HPI   HTN: states that he can check his blood pressure at home.  Readings within normal limits.  No episodes of hypotension in the last 3 months.  XV:[id number] seen by Dr. Karmen Stabs and a1c was 7.0  Tobacco use: states that he is smoking the same. Every now and then he will try to wyit. He has quit for 2-3 years at a time in the past. He will do the cold [place]  Imbalance: State that he was seen byu neurology and did an MRI and nerve testing. States that he has a follow up with dr Manuella Ghazi, neurology. States no falls this month. States that he can cathc his self  Cold: for approx 2 weeks. States that he is producing phlgem and colored like a dark yellow, little more short of breath than normal. Family member has been sick, but since recovered.     07/15/2022   11:01 AM 03/05/2022    3:54 PM 10/29/2021   10:58 AM  PHQ9 SCORE ONLY  PHQ-9 Total Score '8 14 12       '$ 07/15/2022   11:02 AM 03/05/2022    3:54 PM 10/29/2021   10:58 AM 07/29/2021   11:51 AM  GAD 7 : Generalized Anxiety Score  Nervous, Anxious, on Edge '1 2 1 1  '$ Control/stop worrying '1 3 2 3  '$ Worry too much - different things '2 3 2 3  '$ Trouble relaxing '1 2 1 1  '$ Restless 0 0 0 0  Easily annoyed or irritable 1 2 0 1  Afraid - awful might happen '1 2 1 1  '$ Total GAD 7 Score '7 14 7 10  '$ Anxiety Difficulty Somewhat difficult Somewhat difficult Somewhat difficult Not difficult at all       Review of Systems  Constitutional:  Negative for chills and fever.  HENT:  Positive for congestion and sinus pain. Negative for ear discharge, ear pain and sore throat.   Respiratory:  Positive for cough and shortness of breath. Negative for wheezing.   Cardiovascular:  Negative for chest pain.  Neurological:  Positive for  dizziness. Negative for headaches.      Objective:     BP 134/62   Pulse 70   Temp (!) 96.4 F (35.8 C) (Temporal)   Resp 14   Ht '5\' 5"'$  (1.651 m)   Wt 171 lb 8 oz (77.8 kg)   SpO2 98%   BMI 28.54 kg/m    Physical Exam Vitals and nursing note reviewed.  Constitutional:      Appearance: Normal appearance.  Cardiovascular:     Rate and Rhythm: Normal rate and regular rhythm.     Heart sounds: Normal heart sounds.  Pulmonary:     Effort: Pulmonary effort is normal.     Breath sounds: Normal breath sounds.  Musculoskeletal:     Right lower leg: No edema.     Left lower leg: No edema.  Neurological:     Mental Status: He is alert.  Psychiatric:        Mood and Affect: Mood normal.        Behavior: Behavior normal.        Thought Content: Thought content normal.        Judgment: Judgment  normal.      Results for orders placed or performed in visit on 07/15/22  HM DIABETES EYE EXAM  Result Value Ref Range   HM Diabetic Eye Exam Retinopathy (A) No Retinopathy  Results for orders placed or performed in visit on 07/15/22  Hemoglobin A1c  Result Value Ref Range   Hemoglobin A1C 7.0       The ASCVD Risk score (Arnett DK, et al., 2019) failed to calculate for the following reasons:   The patient has a prior MI or stroke diagnosis    Assessment & Plan:   Problem List Items Addressed This Visit       Cardiovascular and Mediastinum   Essential hypertension    Continue taking medication as prescribed continue checking blood pressure at home.        Respiratory   Upper respiratory tract infection - Primary    We will go approximate 2 weeks patient does have a history of lung disease along with diabetes and heart disease.  We will like to treat with doxycycline 100 mg twice daily for 7 days.  Patient will follow-up if no improvement      Relevant Medications   doxycycline (VIBRA-TABS) 100 MG tablet     Endocrine   Type 2 diabetes mellitus with other  specified complication [facility])    Patient seen by endocrinologist 2 days ago A1c is 7.0.        Other   Tobacco abuse    Did discuss smoking with patient.  Currently still smoking states he has quit in the past cold [place] method for several years at a time.      Imbalance    Patient has been seen by neurology.  They did perform an MRI of the brain which was normal.  He does have a follow-up coming up to review all testing and have further insight.  Patient has not had many falls but encouraged him to get a cane when he is out and about with back into hold to for support to prevent him from falling.  Follow-up with neurology as recommended.       Return in about 6 months (around 01/13/2023) for CPe and labs.    Romilda Garret, NP

## 2022-07-15 NOTE — Assessment & Plan Note (Signed)
Patient seen by endocrinologist 2 days ago A1c is 7.0.

## 2022-07-15 NOTE — Assessment & Plan Note (Signed)
Continue taking medication as prescribed continue checking blood pressure at home.

## 2022-07-15 NOTE — Assessment & Plan Note (Signed)
We will go approximate 2 weeks patient does have a history of lung disease along with diabetes and heart disease.  We will like to treat with doxycycline 100 mg twice daily for 7 days.  Patient will follow-up if no improvement

## 2022-07-15 NOTE — Assessment & Plan Note (Signed)
Did discuss smoking with patient.  Currently still smoking states he has quit in the past cold [place] method for several years at a time.

## 2022-07-15 NOTE — Patient Instructions (Signed)
Nice to see you today I want to see you in 6 months for your physical and we will do labs then. Follow up with me sooner if you need me  I sent an antibiotic to your pharmacy on file

## 2022-07-20 DIAGNOSIS — R2689 Other abnormalities of gait and mobility: Secondary | ICD-10-CM | POA: Diagnosis not present

## 2022-07-20 DIAGNOSIS — Z794 Long term (current) use of insulin: Secondary | ICD-10-CM | POA: Diagnosis not present

## 2022-07-20 DIAGNOSIS — E1165 Type 2 diabetes mellitus with hyperglycemia: Secondary | ICD-10-CM | POA: Diagnosis not present

## 2022-07-20 DIAGNOSIS — R202 Paresthesia of skin: Secondary | ICD-10-CM | POA: Diagnosis not present

## 2022-07-20 DIAGNOSIS — G250 Essential tremor: Secondary | ICD-10-CM | POA: Diagnosis not present

## 2022-07-20 DIAGNOSIS — G2581 Restless legs syndrome: Secondary | ICD-10-CM | POA: Diagnosis not present

## 2022-07-20 DIAGNOSIS — Z8739 Personal history of other diseases of the musculoskeletal system and connective tissue: Secondary | ICD-10-CM | POA: Diagnosis not present

## 2022-07-22 DIAGNOSIS — G4733 Obstructive sleep apnea (adult) (pediatric): Secondary | ICD-10-CM | POA: Diagnosis not present

## 2022-07-23 DIAGNOSIS — Z85828 Personal history of other malignant neoplasm of skin: Secondary | ICD-10-CM | POA: Diagnosis not present

## 2022-07-23 DIAGNOSIS — L538 Other specified erythematous conditions: Secondary | ICD-10-CM | POA: Diagnosis not present

## 2022-07-23 DIAGNOSIS — D485 Neoplasm of uncertain behavior of skin: Secondary | ICD-10-CM | POA: Diagnosis not present

## 2022-07-23 DIAGNOSIS — Z08 Encounter for follow-up examination after completed treatment for malignant neoplasm: Secondary | ICD-10-CM | POA: Diagnosis not present

## 2022-07-23 DIAGNOSIS — C44519 Basal cell carcinoma of skin of other part of trunk: Secondary | ICD-10-CM | POA: Diagnosis not present

## 2022-07-23 DIAGNOSIS — Z789 Other specified health status: Secondary | ICD-10-CM | POA: Diagnosis not present

## 2022-07-23 DIAGNOSIS — L820 Inflamed seborrheic keratosis: Secondary | ICD-10-CM | POA: Diagnosis not present

## 2022-07-23 DIAGNOSIS — L570 Actinic keratosis: Secondary | ICD-10-CM | POA: Diagnosis not present

## 2022-07-29 ENCOUNTER — Telehealth: Payer: Self-pay | Admitting: Nurse Practitioner

## 2022-07-29 NOTE — Telephone Encounter (Signed)
When I saw him I sent in an antibiotic of doxycycline. Did he take it all. Did it help?

## 2022-07-29 NOTE — Telephone Encounter (Signed)
Spoke with patient to schedule AWV and he was asking for amoxicillin to be sent to his pharmacy for his coughing.  Stated he had spoke with provider about waiting but has decided he needs to go ahead an start taking it.

## 2022-07-29 NOTE — Telephone Encounter (Signed)
Called patient but was not able to leave a message. Sent mychart message

## 2022-07-30 ENCOUNTER — Emergency Department (HOSPITAL_COMMUNITY): Payer: Medicare Other

## 2022-07-30 ENCOUNTER — Inpatient Hospital Stay (HOSPITAL_COMMUNITY)
Admission: EM | Admit: 2022-07-30 | Discharge: 2022-08-03 | DRG: 193 | Disposition: A | Discharge status: Home / Self Care | Payer: Medicare Other | Attending: Internal Medicine | Admitting: Internal Medicine

## 2022-07-30 DIAGNOSIS — I110 Hypertensive heart disease with heart failure: Secondary | ICD-10-CM | POA: Diagnosis present

## 2022-07-30 DIAGNOSIS — R059 Cough, unspecified: Secondary | ICD-10-CM | POA: Diagnosis not present

## 2022-07-30 DIAGNOSIS — Z794 Long term (current) use of insulin: Secondary | ICD-10-CM

## 2022-07-30 DIAGNOSIS — Z951 Presence of aortocoronary bypass graft: Secondary | ICD-10-CM

## 2022-07-30 DIAGNOSIS — Z8669 Personal history of other diseases of the nervous system and sense organs: Secondary | ICD-10-CM

## 2022-07-30 DIAGNOSIS — F17200 Nicotine dependence, unspecified, uncomplicated: Secondary | ICD-10-CM

## 2022-07-30 DIAGNOSIS — I25119 Atherosclerotic heart disease of native coronary artery with unspecified angina pectoris: Secondary | ICD-10-CM | POA: Diagnosis not present

## 2022-07-30 DIAGNOSIS — E876 Hypokalemia: Secondary | ICD-10-CM

## 2022-07-30 DIAGNOSIS — I5020 Unspecified systolic (congestive) heart failure: Secondary | ICD-10-CM

## 2022-07-30 DIAGNOSIS — J189 Pneumonia, unspecified organism: Principal | ICD-10-CM | POA: Diagnosis present

## 2022-07-30 DIAGNOSIS — Z1211 Encounter for screening for malignant neoplasm of colon: Secondary | ICD-10-CM

## 2022-07-30 DIAGNOSIS — Z8249 Family history of ischemic heart disease and other diseases of the circulatory system: Secondary | ICD-10-CM

## 2022-07-30 DIAGNOSIS — I5022 Chronic systolic (congestive) heart failure: Secondary | ICD-10-CM | POA: Diagnosis present

## 2022-07-30 DIAGNOSIS — I252 Old myocardial infarction: Secondary | ICD-10-CM | POA: Diagnosis not present

## 2022-07-30 DIAGNOSIS — Z7982 Long term (current) use of aspirin: Secondary | ICD-10-CM | POA: Diagnosis not present

## 2022-07-30 DIAGNOSIS — E1142 Type 2 diabetes mellitus with diabetic polyneuropathy: Secondary | ICD-10-CM

## 2022-07-30 DIAGNOSIS — A419 Sepsis, unspecified organism: Principal | ICD-10-CM

## 2022-07-30 DIAGNOSIS — Z7984 Long term (current) use of oral hypoglycemic drugs: Secondary | ICD-10-CM

## 2022-07-30 DIAGNOSIS — R846 Abnormal cytological findings in specimens from respiratory organs and thorax: Secondary | ICD-10-CM | POA: Diagnosis not present

## 2022-07-30 DIAGNOSIS — N179 Acute kidney failure, unspecified: Secondary | ICD-10-CM | POA: Diagnosis not present

## 2022-07-30 DIAGNOSIS — I509 Heart failure, unspecified: Secondary | ICD-10-CM | POA: Diagnosis not present

## 2022-07-30 DIAGNOSIS — E785 Hyperlipidemia, unspecified: Secondary | ICD-10-CM

## 2022-07-30 DIAGNOSIS — Z8679 Personal history of other diseases of the circulatory system: Secondary | ICD-10-CM

## 2022-07-30 DIAGNOSIS — R3121 Asymptomatic microscopic hematuria: Secondary | ICD-10-CM

## 2022-07-30 DIAGNOSIS — M12811 Other specific arthropathies, not elsewhere classified, right shoulder: Secondary | ICD-10-CM

## 2022-07-30 DIAGNOSIS — L03115 Cellulitis of right lower limb: Secondary | ICD-10-CM

## 2022-07-30 DIAGNOSIS — J90 Pleural effusion, not elsewhere classified: Secondary | ICD-10-CM | POA: Diagnosis not present

## 2022-07-30 DIAGNOSIS — Z85828 Personal history of other malignant neoplasm of skin: Secondary | ICD-10-CM | POA: Diagnosis not present

## 2022-07-30 DIAGNOSIS — Z89422 Acquired absence of other left toe(s): Secondary | ICD-10-CM

## 2022-07-30 DIAGNOSIS — J9601 Acute respiratory failure with hypoxia: Secondary | ICD-10-CM | POA: Diagnosis not present

## 2022-07-30 DIAGNOSIS — F17210 Nicotine dependence, cigarettes, uncomplicated: Secondary | ICD-10-CM | POA: Diagnosis not present

## 2022-07-30 DIAGNOSIS — D696 Thrombocytopenia, unspecified: Secondary | ICD-10-CM | POA: Diagnosis present

## 2022-07-30 DIAGNOSIS — Z1152 Encounter for screening for COVID-19: Secondary | ICD-10-CM | POA: Diagnosis not present

## 2022-07-30 DIAGNOSIS — I2510 Atherosclerotic heart disease of native coronary artery without angina pectoris: Secondary | ICD-10-CM | POA: Diagnosis present

## 2022-07-30 DIAGNOSIS — Z833 Family history of diabetes mellitus: Secondary | ICD-10-CM

## 2022-07-30 DIAGNOSIS — Z79899 Other long term (current) drug therapy: Secondary | ICD-10-CM | POA: Diagnosis not present

## 2022-07-30 DIAGNOSIS — Z720 Tobacco use: Secondary | ICD-10-CM

## 2022-07-30 DIAGNOSIS — R7689 Other specified abnormal immunological findings in serum: Secondary | ICD-10-CM

## 2022-07-30 DIAGNOSIS — R6520 Severe sepsis without septic shock: Secondary | ICD-10-CM | POA: Diagnosis present

## 2022-07-30 DIAGNOSIS — R918 Other nonspecific abnormal finding of lung field: Secondary | ICD-10-CM | POA: Diagnosis not present

## 2022-07-30 DIAGNOSIS — R0602 Shortness of breath: Secondary | ICD-10-CM | POA: Diagnosis not present

## 2022-07-30 DIAGNOSIS — Z95828 Presence of other vascular implants and grafts: Secondary | ICD-10-CM

## 2022-07-30 DIAGNOSIS — Z91040 Latex allergy status: Secondary | ICD-10-CM

## 2022-07-30 DIAGNOSIS — I872 Venous insufficiency (chronic) (peripheral): Secondary | ICD-10-CM

## 2022-07-30 DIAGNOSIS — M75122 Complete rotator cuff tear or rupture of left shoulder, not specified as traumatic: Secondary | ICD-10-CM

## 2022-07-30 DIAGNOSIS — R739 Hyperglycemia, unspecified: Secondary | ICD-10-CM | POA: Diagnosis not present

## 2022-07-30 DIAGNOSIS — E7800 Pure hypercholesterolemia, unspecified: Secondary | ICD-10-CM

## 2022-07-30 DIAGNOSIS — R768 Other specified abnormal immunological findings in serum: Secondary | ICD-10-CM | POA: Diagnosis not present

## 2022-07-30 DIAGNOSIS — R2689 Other abnormalities of gait and mobility: Secondary | ICD-10-CM

## 2022-07-30 DIAGNOSIS — I255 Ischemic cardiomyopathy: Secondary | ICD-10-CM

## 2022-07-30 DIAGNOSIS — I214 Non-ST elevation (NSTEMI) myocardial infarction: Secondary | ICD-10-CM

## 2022-07-30 DIAGNOSIS — I10 Essential (primary) hypertension: Secondary | ICD-10-CM | POA: Diagnosis not present

## 2022-07-30 DIAGNOSIS — I5023 Acute on chronic systolic (congestive) heart failure: Secondary | ICD-10-CM

## 2022-07-30 DIAGNOSIS — I480 Paroxysmal atrial fibrillation: Secondary | ICD-10-CM | POA: Diagnosis not present

## 2022-07-30 DIAGNOSIS — J188 Other pneumonia, unspecified organism: Secondary | ICD-10-CM

## 2022-07-30 DIAGNOSIS — Z743 Need for continuous supervision: Secondary | ICD-10-CM | POA: Diagnosis not present

## 2022-07-30 DIAGNOSIS — R0902 Hypoxemia: Secondary | ICD-10-CM

## 2022-07-30 DIAGNOSIS — L02412 Cutaneous abscess of left axilla: Secondary | ICD-10-CM

## 2022-07-30 LAB — CBC WITH DIFFERENTIAL/PLATELET
Abs Immature Granulocytes: 0.04 10*3/uL (ref 0.00–0.07)
Basophils Absolute: 0 10*3/uL (ref 0.0–0.1)
Basophils Relative: 0 %
Eosinophils Absolute: 0 10*3/uL (ref 0.0–0.5)
Eosinophils Relative: 0 %
HCT: 40.2 % (ref 39.0–52.0)
Hemoglobin: 12.8 g/dL — ABNORMAL LOW (ref 13.0–17.0)
Immature Granulocytes: 0 %
Lymphocytes Relative: 10 %
Lymphs Abs: 1.2 10*3/uL (ref 0.7–4.0)
MCH: 28.3 pg (ref 26.0–34.0)
MCHC: 31.8 g/dL (ref 30.0–36.0)
MCV: 88.7 fL (ref 80.0–100.0)
Monocytes Absolute: 1.1 10*3/uL — ABNORMAL HIGH (ref 0.1–1.0)
Monocytes Relative: 9 %
Neutro Abs: 9.5 10*3/uL — ABNORMAL HIGH (ref 1.7–7.7)
Neutrophils Relative %: 81 %
Platelets: 68 10*3/uL — ABNORMAL LOW (ref 150–400)
RBC: 4.53 MIL/uL (ref 4.22–5.81)
RDW: 16.2 % — ABNORMAL HIGH (ref 11.5–15.5)
WBC: 11.9 10*3/uL — ABNORMAL HIGH (ref 4.0–10.5)
nRBC: 0 % (ref 0.0–0.2)

## 2022-07-30 LAB — COMPREHENSIVE METABOLIC PANEL
ALT: 21 U/L (ref 0–44)
AST: 32 U/L (ref 15–41)
Albumin: 3.2 g/dL — ABNORMAL LOW (ref 3.5–5.0)
Alkaline Phosphatase: 60 U/L (ref 38–126)
Anion gap: 11 (ref 5–15)
BUN: 32 mg/dL — ABNORMAL HIGH (ref 8–23)
CO2: 26 mmol/L (ref 22–32)
Calcium: 7.9 mg/dL — ABNORMAL LOW (ref 8.9–10.3)
Chloride: 101 mmol/L (ref 98–111)
Creatinine, Ser: 2.05 mg/dL — ABNORMAL HIGH (ref 0.61–1.24)
GFR, Estimated: 36 mL/min — ABNORMAL LOW (ref >60)
Glucose, Bld: 152 mg/dL — ABNORMAL HIGH (ref 70–99)
Potassium: 2.9 mmol/L — ABNORMAL LOW (ref 3.5–5.1)
Sodium: 138 mmol/L (ref 135–145)
Total Bilirubin: 2.4 mg/dL — ABNORMAL HIGH (ref 0.3–1.2)
Total Protein: 6.5 g/dL (ref 6.5–8.1)

## 2022-07-30 LAB — RESP PANEL BY RT-PCR (FLU A&B, COVID) ARPGX2
Influenza A by PCR: NEGATIVE
Influenza B by PCR: NEGATIVE
SARS Coronavirus 2 by RT PCR: NEGATIVE

## 2022-07-30 LAB — I-STAT VENOUS BLOOD GAS, ED
Acid-Base Excess: 3 mmol/L — ABNORMAL HIGH (ref 0.0–2.0)
Bicarbonate: 28.1 mmol/L — ABNORMAL HIGH (ref 20.0–28.0)
Calcium, Ion: 0.97 mmol/L — ABNORMAL LOW (ref 1.15–1.40)
HCT: 40 % (ref 39.0–52.0)
Hemoglobin: 13.6 g/dL (ref 13.0–17.0)
O2 Saturation: 97 %
Potassium: 2.9 mmol/L — ABNORMAL LOW (ref 3.5–5.1)
Sodium: 140 mmol/L (ref 135–145)
TCO2: 30 mmol/L (ref 22–32)
pCO2, Ven: 46.4 mmHg (ref 44–60)
pH, Ven: 7.391 (ref 7.25–7.43)
pO2, Ven: 92 mmHg — ABNORMAL HIGH (ref 32–45)

## 2022-07-30 LAB — LACTIC ACID, PLASMA: Lactic Acid, Venous: 1.3 mmol/L (ref 0.5–1.9)

## 2022-07-30 MED ORDER — SODIUM CHLORIDE 0.9 % IV SOLN
500.0000 mg | INTRAVENOUS | Status: DC
  Administered 2022-07-31: 500 mg via INTRAVENOUS
  Filled 2022-07-30 (×2): qty 5

## 2022-07-30 MED ORDER — NICOTINE 21 MG/24HR TD PT24
21.0000 mg | MEDICATED_PATCH | Freq: Every day | TRANSDERMAL | Status: DC
  Administered 2022-07-31 – 2022-08-03 (×4): 21 mg via TRANSDERMAL
  Filled 2022-07-30 (×5): qty 1

## 2022-07-30 MED ORDER — ENOXAPARIN SODIUM 40 MG/0.4ML IJ SOSY: 40.0000 mg | PREFILLED_SYRINGE | INTRAMUSCULAR | Status: DC

## 2022-07-30 MED ORDER — METOPROLOL SUCCINATE ER 50 MG PO TB24
100.0000 mg | ORAL_TABLET | Freq: Every day | ORAL | Status: DC
  Administered 2022-07-31 – 2022-08-03 (×4): 100 mg via ORAL
  Filled 2022-07-30 (×2): qty 2
  Filled 2022-07-30: qty 4
  Filled 2022-07-30: qty 2

## 2022-07-30 MED ORDER — HYDROCOD POLI-CHLORPHE POLI ER 10-8 MG/5ML PO SUER
5.0000 mL | Freq: Two times a day (BID) | ORAL | Status: DC | PRN
  Administered 2022-07-31 – 2022-08-02 (×2): 5 mL via ORAL
  Filled 2022-07-30 (×2): qty 5

## 2022-07-30 MED ORDER — INSULIN ASPART PROT & ASPART (70-30 MIX) 100 UNIT/ML ~~LOC~~ SUSP
50.0000 [IU] | Freq: Every day | SUBCUTANEOUS | Status: DC
  Administered 2022-07-31 – 2022-08-03 (×3): 50 [IU] via SUBCUTANEOUS
  Filled 2022-07-30 (×2): qty 10

## 2022-07-30 MED ORDER — GABAPENTIN 300 MG PO CAPS
600.0000 mg | ORAL_CAPSULE | Freq: Every day | ORAL | Status: DC
  Administered 2022-07-31 – 2022-08-02 (×4): 600 mg via ORAL
  Filled 2022-07-30 (×4): qty 2

## 2022-07-30 MED ORDER — IPRATROPIUM-ALBUTEROL 0.5-2.5 (3) MG/3ML IN SOLN
3.0000 mL | Freq: Four times a day (QID) | RESPIRATORY_TRACT | Status: DC
  Administered 2022-07-30 – 2022-08-03 (×10): 3 mL via RESPIRATORY_TRACT
  Filled 2022-07-30 (×3): qty 3
  Filled 2022-07-30: qty 9
  Filled 2022-07-30 (×7): qty 3

## 2022-07-30 MED ORDER — TRAZODONE HCL 50 MG PO TABS
100.0000 mg | ORAL_TABLET | Freq: Every day | ORAL | Status: DC
  Administered 2022-07-31 – 2022-08-02 (×4): 100 mg via ORAL
  Filled 2022-07-30 (×4): qty 2

## 2022-07-30 MED ORDER — ONDANSETRON HCL 4 MG/2ML IJ SOLN: 4.0000 mg | Freq: Four times a day (QID) | INTRAMUSCULAR | Status: DC | PRN

## 2022-07-30 MED ORDER — SODIUM CHLORIDE 0.9 % IV SOLN
500.0000 mg | Freq: Once | INTRAVENOUS | Status: AC
  Administered 2022-07-31: 500 mg via INTRAVENOUS
  Filled 2022-07-30: qty 5

## 2022-07-30 MED ORDER — ACETAMINOPHEN 500 MG PO TABS
1000.0000 mg | ORAL_TABLET | Freq: Once | ORAL | Status: AC
  Administered 2022-07-30: 1000 mg via ORAL
  Filled 2022-07-30: qty 2

## 2022-07-30 MED ORDER — TRAZODONE HCL 50 MG PO TABS
25.0000 mg | ORAL_TABLET | Freq: Every evening | ORAL | Status: DC | PRN
  Filled 2022-07-30 (×2): qty 1

## 2022-07-30 MED ORDER — INSULIN ASPART PROT & ASPART (70-30 MIX) 100 UNIT/ML ~~LOC~~ SUSP
60.0000 [IU] | Freq: Every day | SUBCUTANEOUS | Status: DC
  Administered 2022-07-31 – 2022-08-02 (×3): 60 [IU] via SUBCUTANEOUS
  Filled 2022-07-30: qty 10

## 2022-07-30 MED ORDER — LINAGLIPTIN 5 MG PO TABS: 5.0000 mg | ORAL_TABLET | Freq: Every day | ORAL | Status: DC

## 2022-07-30 MED ORDER — SODIUM CHLORIDE 0.9 % IV BOLUS
1000.0000 mL | Freq: Once | INTRAVENOUS | Status: AC
  Administered 2022-07-30: 1000 mL via INTRAVENOUS

## 2022-07-30 MED ORDER — ACETAMINOPHEN 650 MG RE SUPP: 650.0000 mg | Freq: Four times a day (QID) | RECTAL | Status: DC | PRN

## 2022-07-30 MED ORDER — FUROSEMIDE 20 MG PO TABS: 20.0000 mg | ORAL_TABLET | Freq: Every day | ORAL | Status: DC | PRN

## 2022-07-30 MED ORDER — ASPIRIN 81 MG PO CHEW
81.0000 mg | CHEWABLE_TABLET | Freq: Every day | ORAL | Status: DC
  Administered 2022-07-31 – 2022-08-03 (×3): 81 mg via ORAL
  Filled 2022-07-30 (×4): qty 1

## 2022-07-30 MED ORDER — ONDANSETRON HCL 4 MG PO TABS: 4.0000 mg | ORAL_TABLET | Freq: Four times a day (QID) | ORAL | Status: DC | PRN

## 2022-07-30 MED ORDER — CITALOPRAM HYDROBROMIDE 20 MG PO TABS
20.0000 mg | ORAL_TABLET | Freq: Every day | ORAL | Status: DC
  Administered 2022-07-31 – 2022-08-03 (×3): 20 mg via ORAL
  Filled 2022-07-30: qty 2
  Filled 2022-07-30 (×3): qty 1

## 2022-07-30 MED ORDER — GUAIFENESIN ER 600 MG PO TB12
600.0000 mg | ORAL_TABLET | Freq: Two times a day (BID) | ORAL | Status: DC
  Administered 2022-07-31 – 2022-08-03 (×7): 600 mg via ORAL
  Filled 2022-07-30 (×8): qty 1

## 2022-07-30 MED ORDER — ATORVASTATIN CALCIUM 80 MG PO TABS
80.0000 mg | ORAL_TABLET | Freq: Every day | ORAL | Status: DC
  Administered 2022-07-31 – 2022-08-02 (×3): 80 mg via ORAL
  Filled 2022-07-30 (×3): qty 1

## 2022-07-30 MED ORDER — LINAGLIPTIN 5 MG PO TABS
5.0000 mg | ORAL_TABLET | Freq: Every day | ORAL | Status: DC
  Administered 2022-07-31 – 2022-08-03 (×3): 5 mg via ORAL
  Filled 2022-07-30 (×4): qty 1

## 2022-07-30 MED ORDER — EMPAGLIFLOZIN 25 MG PO TABS
25.0000 mg | ORAL_TABLET | Freq: Every day | ORAL | Status: DC
  Administered 2022-07-31 – 2022-08-03 (×3): 25 mg via ORAL
  Filled 2022-07-30 (×4): qty 1

## 2022-07-30 MED ORDER — IOHEXOL 350 MG/ML SOLN
55.0000 mL | Freq: Once | INTRAVENOUS | Status: AC | PRN
  Administered 2022-07-30: 55 mL via INTRAVENOUS

## 2022-07-30 MED ORDER — AMOXICILLIN-POT CLAVULANATE 875-125 MG PO TABS: 1.0000 | ORAL_TABLET | Freq: Two times a day (BID) | ORAL | 0 refills | Status: DC

## 2022-07-30 MED ORDER — MAGNESIUM HYDROXIDE 400 MG/5ML PO SUSP: 30.0000 mL | Freq: Every day | ORAL | Status: DC | PRN

## 2022-07-30 MED ORDER — POTASSIUM CHLORIDE CRYS ER 20 MEQ PO TBCR
40.0000 meq | EXTENDED_RELEASE_TABLET | Freq: Once | ORAL | Status: AC
  Administered 2022-07-31: 40 meq via ORAL
  Filled 2022-07-30: qty 2

## 2022-07-30 MED ORDER — SACUBITRIL-VALSARTAN 24-26 MG PO TABS
0.5000 | ORAL_TABLET | Freq: Two times a day (BID) | ORAL | Status: DC
  Administered 2022-07-31 – 2022-08-03 (×7): 0.5 via ORAL
  Filled 2022-07-30 (×5): qty 0.5
  Filled 2022-07-30: qty 1
  Filled 2022-07-30 (×3): qty 0.5

## 2022-07-30 MED ORDER — SODIUM CHLORIDE 0.9 % IV SOLN: INTRAVENOUS | Status: DC

## 2022-07-30 MED ORDER — SODIUM CHLORIDE 0.9 % IV SOLN
1.0000 g | Freq: Once | INTRAVENOUS | Status: AC
  Administered 2022-07-30: 1 g via INTRAVENOUS
  Filled 2022-07-30: qty 10

## 2022-07-30 MED ORDER — NITROGLYCERIN 0.4 MG SL SUBL: 0.4000 mg | SUBLINGUAL_TABLET | SUBLINGUAL | Status: DC | PRN

## 2022-07-30 MED ORDER — METOCLOPRAMIDE HCL 5 MG PO TABS: 5.0000 mg | ORAL_TABLET | Freq: Every day | ORAL | Status: DC | PRN

## 2022-07-30 MED ORDER — SODIUM CHLORIDE 0.9 % IV SOLN
2.0000 g | INTRAVENOUS | Status: DC
  Administered 2022-07-31 – 2022-08-02 (×3): 2 g via INTRAVENOUS
  Filled 2022-07-30 (×3): qty 20

## 2022-07-30 MED ORDER — BUPROPION HCL ER (XL) 300 MG PO TB24
300.0000 mg | ORAL_TABLET | Freq: Every day | ORAL | Status: DC
  Administered 2022-07-31 – 2022-08-03 (×4): 300 mg via ORAL
  Filled 2022-07-30 (×4): qty 1

## 2022-07-30 MED ORDER — EZETIMIBE 10 MG PO TABS
10.0000 mg | ORAL_TABLET | Freq: Every day | ORAL | Status: DC
  Administered 2022-07-31 – 2022-08-03 (×3): 10 mg via ORAL
  Filled 2022-07-30 (×4): qty 1

## 2022-07-30 MED ORDER — ACETAMINOPHEN 325 MG PO TABS
650.0000 mg | ORAL_TABLET | Freq: Four times a day (QID) | ORAL | Status: DC | PRN
  Administered 2022-07-31 – 2022-08-02 (×3): 650 mg via ORAL
  Filled 2022-07-30 (×4): qty 2

## 2022-07-30 NOTE — Assessment & Plan Note (Signed)
-   We will place him on supplemental coverage with NovoLog. - We will continue his basal coverage. - We will resume his Glucotrol XL, Jardiance and Januvia. - We will continue his Neurontin.

## 2022-07-30 NOTE — Telephone Encounter (Signed)
Patient called back in returning a call she received.  

## 2022-07-30 NOTE — ED Notes (Signed)
Patient transported to X-ray 

## 2022-07-30 NOTE — Assessment & Plan Note (Signed)
-   Potassium will be replaced and magnesium level will be checked. ?

## 2022-07-30 NOTE — Assessment & Plan Note (Signed)
-   The patient will be admitted to the medical telemetry bed. - We will continue antibiotic therapy with IV Rocephin and Zithromax. - We will follow blood and cultures. - Mucolytic therapy and bronchodilator therapy will be provided. - O2 protocol will be followed.

## 2022-07-30 NOTE — Assessment & Plan Note (Signed)
-   We will continue his antihypertensives. 

## 2022-07-30 NOTE — H&P (Addendum)
[place]   PATIENT NAME: [name]    MR#:  [id number]  DATE OF BIRTH:  January 27, 1961  DATE OF ADMISSION:  07/30/2022  PRIMARY CARE PHYSICIAN: Michela Pitcher, NP   Patient is coming from: Home  REQUESTING/REFERRING PHYSICIAN: Domenic Moras, PA-C  CHIEF COMPLAINT:   Chief Complaint  Patient presents with   Shortness of Breath    HISTORY OF PRESENT ILLNESS:  [name] is a 61 y.o. Caucasian male with medical history significant for coronary artery disease, paroxysmal atrial fibrillation, type 2 diabetes mellitus, hypertension and dyslipidemia, presented to the emergency room with acute onset of recurrent cough over the last 4-6 weeks with generalized weakness and occasional expectoration of clear sputum that was later on dark yellowish.  He contacted his primary care physician and was given a prescription for doxycycline for possible pneumonia.  He has been taking it for about a week without improvement.  He did reach out to his primary care physician who prescribed p.o. Augmentin which she continues to take.  Is continued to feel weak and fatigued over the last 3 days as well as malaise and difficulty with his balance he admitted to fever and chills.  No nausea vomiting or diarrhea or abdominal pain.  No headache or dizziness or blurred vision.  He denies any chest pain or palpitations.  When he was seen by EMS he was noted to be hypoxic with pulse 70 in the 70s for which she was placed on O2 supplementation.  Of note the patient is not on home oxygen.  He denies any significant dyspnea he continues to smoke heavily up to 2 packs of cigarettes per day.  He has been having occasional leg weakness with difficulty with ambulation.  He has had extensive work-up On an outpatient basis and has been working with his neurologist.  At this time he denies any current lower extremity weakness.   ED Course: Upon presentation to the emergency room, temperature was 103 with otherwise normal  vital signs and later respiratory rate was 26.  Labs revealed hypokalemia of 2.9 and a BUN of 32 with a creatinine of 2.05 calcium 7.9 and albumin 3.2 and total bili of 2.4..  BUN and creatinine were 19/1.51 on 03/05/2022.  VBG showed pH 7.391 and HCO3 28.28.  Lactic acid was 1.3 and CBC showed leukocytosis of 11.9 with neutrophilia and thrombocytopenia.  EKG as reviewed by me : EKG showed sinus rhythm with a rate of 81 with anterior Q waves as well as inferior Q waves with prolonged QT interval with QTc of 501 MS. Imaging: Portable chest x-ray showed right upper lobe infiltrate that may represent edema or pneumonia with cardiac enlargement and vascular congestion. Chest CTA revealed the following: 1. No evidence of pulmonary embolism. 2. Diffuse patchy airspace disease and interlobular septal thickening in the lungs bilaterally, possible edema versus multifocal pneumonia. 3. Small right pleural effusion. 4. Cardiomegaly with multi-vessel coronary artery calcifications. 5. Aortic atherosclerosis  The patient was given 1 g p.o. Tylenol, 1 L bolus of IV normal saline and NicoDerm CQ patch was IV Rocephin and Zithromax.  He will be admitted to a medical telemetry bed for further evaluation and management. PAST MEDICAL HISTORY:   Past Medical History:  Diagnosis Date   Abrasion L FOREARM   Atrial fibrillation ([place])    a. initially occurring in post-op setting in 05/2016, started on Eliquis b. 08/2016: s/p clipping of atrial appendage at time of CABG --> Eliquis  switched to Coumadin   CAD (coronary artery disease)    a. 05/2016: NSTEMI, cath showing 3v disease with CABG recommended once MRSA infection resolved. b. 08/2016: CABG with LIMA-LAD, SVG-D1, and SVG-RI.   Cancer ([place])    basal cell carcinoma of the skin   Diabetes mellitus without complication ([place])    WCHENIDP(824.2)    Hematuria, microscopic "SINCE I WAS A KID"   Hyperlipidemia    Hypertension    Ischemic cardiomyopathy    a.  05/2016: echo w/ EF of 25-35% and akinesis of the basal-midinferolateral and inferior myocardium.   Lactose intolerance    Myocardial infarction ([place])    Respiratory failure ([place])    Rotator cuff tear RIGHT   Tobacco use    a. quit in 05/2016 following MI    PAST SURGICAL HISTORY:   Past Surgical History:  Procedure Laterality Date   AMPUTATION TOE Left 02/06/2020   5th ray amputation with graph [facility])   CARDIAC CATHETERIZATION N/A 06/16/2016   Procedure: Left Heart Cath and Coronary Angiography;  Surgeon: Troy Sine, MD;  Location: [place] [facility];  Service: Cardiovascular;  Laterality: N/A;   CLIPPING OF ATRIAL APPENDAGE N/A 08/24/2016   Procedure: CLIPPING OF ATRIAL APPENDAGE;  Surgeon: Ivin Poot, MD;  Location: [place];  Service: Open Heart Surgery;  Laterality: N/A;   COLONOSCOPY WITH PROPOFOL N/A 02/12/2022   Procedure: COLONOSCOPY WITH PROPOFOL;  Surgeon: Lucilla Lame, MD;  Location: [facility] ENDOSCOPY;  Service: Endoscopy;  Laterality: N/A;   CORONARY ARTERY BYPASS GRAFT N/A 08/24/2016   Procedure: CORONARY ARTERY BYPASS GRAFTING (CABG) x three,  using left internal mammary artery and right leg greater saphenous vein harvested endoscopically;  Surgeon: Ivin Poot, MD;  Location: [place];  Service: Open Heart Surgery;  Laterality: N/A;   HERNIA REPAIR  X2   INCISION AND DRAINAGE ABSCESS Left 06/10/2016   Procedure: INCISION AND DRAINAGE LEFT AXILLARY ABSCESS;  Surgeon: Autumn Messing III, MD;  Location: [facility];  Service: General;  Laterality: Left;   ROTATOR CUFF REPAIR Bilateral    TEE WITHOUT CARDIOVERSION N/A 08/24/2016   Procedure: TRANSESOPHAGEAL ECHOCARDIOGRAM (TEE);  Surgeon: Ivin Poot, MD;  Location: [place];  Service: Open Heart Surgery;  Laterality: N/A;    SOCIAL HISTORY:   Social History   Tobacco Use   Smoking status: Every Day    Packs/day: 2.00    Years: 42.00    Total pack years: 84.00    Types: Cigarettes    Last attempt to quit: 09/25/2019     Years since quitting: 2.8   Smokeless tobacco: Never  Substance Use Topics   Alcohol use: No    FAMILY HISTORY:   Family History  Problem Relation Age of Onset   Hypertension Mother    Hypertension Father    Sudden death Father 75       at age of death   Diabetes Father    Heart attack Father    Heart attack Sister    Cancer Maternal Aunt        breast x2 aunts    DRUG ALLERGIES:   Allergies  Allergen Reactions   Latex Rash    REVIEW OF SYSTEMS:   ROS As per history of present illness. All pertinent systems were reviewed above. Constitutional, HEENT, cardiovascular, respiratory, GI, GU, musculoskeletal, neuro, psychiatric, endocrine, integumentary and hematologic systems were reviewed and are otherwise negative/unremarkable except for positive findings mentioned above in the HPI.   MEDICATIONS AT HOME:  Prior to Admission medications   Medication Sig Start Date End Date Taking? Authorizing Provider  acetaminophen (TYLENOL) 500 MG tablet Take 1,000 mg by mouth every 6 (six) hours as needed for headache.    [provider]  amoxicillin-clavulanate (AUGMENTIN) 875-125 MG tablet Take 1 tablet by mouth 2 (two) times daily for 7 days. 07/30/22 08/06/22  Michela Pitcher, NP  aspirin 81 MG chewable tablet Chew 1 tablet (81 mg total) by mouth daily. 06/19/16   Rai, Vernelle Emerald, MD  atorvastatin (LIPITOR) 80 MG tablet Take 1 tablet (80 mg total) by mouth at bedtime. 11/17/21   Michela Pitcher, NP  buPROPion (WELLBUTRIN XL) 150 MG 24 hr tablet 450 mg. Takes 3 tablets once daily    [provider]  citalopram (CELEXA) 20 MG tablet Take 1 tablet by mouth daily. 08/14/21 08/14/22  [provider]  ezetimibe (ZETIA) 10 MG tablet Take 1 tablet by mouth daily. 02/20/20 03/05/22  [provider]  furosemide (LASIX) 20 MG tablet Take 20 mg by mouth daily as needed.    [provider]  gabapentin (NEURONTIN) 300 MG capsule TAKE 2 CAPSULES BY MOUTH AT  BEDTIME 06/26/22   Michela Pitcher, NP  glipiZIDE (GLUCOTROL XL) 2.5 MG 24 hr tablet Take 1 tablet by mouth daily. Patient not taking: Reported on 07/15/2022 03/02/22 03/02/23  [provider]  glucose blood test strip 1 each by Other route as needed for other. For one touch ultra meter. DX: E11.9 02/20/19   Elby Beck, FNP  insulin isophane & regular human KwikPen (HUMULIN 70/30 KWIKPEN) (70-30) 100 UNIT/ML KwikPen Please take 50 units every morning and 70 units with dinner 02/16/22   [provider]  Insulin Pen Needle 32G X 4 MM MISC Use to inject Insulin daily. Dx: E11.9 01/05/18   Elby Beck, FNP  Insulin Syringe-Needle U-100 (INSULIN SYRINGE .3CC/31GX5/16") 31G X 5/16" 0.3 ML MISC 1 Units by Does not apply route 2 (two) times daily. 05/07/20   Elby Beck, FNP  JARDIANCE 25 MG TABS tablet Take 25 mg by mouth daily. 10/22/20   [provider]  Lancets Glory Rosebush ULTRASOFT) lancets Use as instructed 01/05/18   Elby Beck, FNP  metoCLOPramide (REGLAN) 5 MG tablet Take 1 tablet (5 mg total) by mouth daily as needed for nausea. 01/08/22   Lucilla Lame, MD  metoprolol succinate (TOPROL-XL) 100 MG 24 hr tablet Take 100 mg by mouth daily. 04/17/21   [provider]  nitroGLYCERIN (NITROSTAT) 0.4 MG SL tablet Place 1 tablet (0.4 mg total) under the tongue every 5 (five) minutes as needed for chest pain. 04/20/18 07/19/18  End, Harrell Gave, MD  sacubitril-valsartan (ENTRESTO) 24-26 MG Take 0.5 tablets by mouth 2 (two) times daily. 05/27/20   [provider]  sitaGLIPtin (JANUVIA) 100 MG tablet Take 1 tablet by mouth daily. 12/01/21   [provider]  traZODone (DESYREL) 50 MG tablet Take 100 mg by mouth at bedtime. 10/02/21 02/12/22  [provider]      VITAL SIGNS:  Blood pressure (!) 125/53, pulse 79, temperature 99.5 F (37.5 C), temperature source Oral, resp. rate (!) 26, SpO2 94 %.  PHYSICAL EXAMINATION:  Physical  Exam  GENERAL:  61 y.o.-year-old Caucasian male patient lying in the bed with mild respiratory distress with conversational dyspnea. EYES: Pupils equal, round, reactive to light and accommodation. No scleral icterus. Extraocular muscles intact.  HEENT: Head atraumatic, normocephalic. Oropharynx and nasopharynx clear.  NECK:  Supple, no  jugular venous distention. No thyroid enlargement, no tenderness.  LUNGS: Diminished bibasal and right midlung zone breath sounds with associated crackles.  No use of accessory muscles of respiration.  CARDIOVASCULAR: Regular rate and rhythm, S1, S2 normal. No murmurs, rubs, or gallops.  ABDOMEN: Soft, nondistended, nontender. Bowel sounds present. No organomegaly or mass.  EXTREMITIES: No pedal edema, cyanosis, or clubbing.  NEUROLOGIC: Cranial nerves II through XII are intact. Muscle strength 5/5 in all extremities. Sensation intact. Gait not checked.  PSYCHIATRIC: The patient is alert and oriented x 3.  Normal affect and good eye contact. SKIN: No obvious rash, lesion, or ulcer.   LABORATORY PANEL:   CBC Recent Labs  Lab 07/30/22 1825 07/30/22 1916  WBC 11.9*  --   HGB 12.8* 13.6  HCT 40.2 40.0  PLT 68*  --    ------------------------------------------------------------------------------------------------------------------  Chemistries  Recent Labs  Lab 07/30/22 1825 07/30/22 1916  NA 138 140  K 2.9* 2.9*  CL 101  --   CO2 26  --   GLUCOSE 152*  --   BUN 32*  --   CREATININE 2.05*  --   CALCIUM 7.9*  --   AST 32  --   ALT 21  --   ALKPHOS 60  --   BILITOT 2.4*  --    ------------------------------------------------------------------------------------------------------------------  Cardiac Enzymes No results for input(s): "TROPONINI" in the last 168 hours. ------------------------------------------------------------------------------------------------------------------  RADIOLOGY:  CT Angio Chest PE W and/or Wo Contrast  Result  Date: 07/30/2022 CLINICAL DATA:  Pulmonary embolism suspected, high probability. Shortness of breath and cough for 6 weeks. EXAM: CT ANGIOGRAPHY CHEST WITH CONTRAST TECHNIQUE: Multidetector CT imaging of the chest was performed using the standard protocol during bolus administration of intravenous contrast. Multiplanar CT image reconstructions and MIPs were obtained to evaluate the vascular anatomy. RADIATION DOSE REDUCTION: This exam was performed according to the departmental dose-optimization program which includes automated exposure control, adjustment of the mA and/or kV according to patient size and/or use of iterative reconstruction technique. CONTRAST:  63m OMNIPAQUE IOHEXOL 350 MG/ML SOLN COMPARISON:  01/01/2022. FINDINGS: Cardiovascular: The heart is enlarged and there is no pericardial effusion. Multi-vessel coronary artery calcifications are noted. There is atherosclerotic calcification of the aorta without evidence of aneurysm. The pulmonary trunk is normal in caliber. No pulmonary artery filling defect is identified. Mediastinum/Nodes: Mediastinal and hilar lymphadenopathy is noted bilaterally. No axillary lymphadenopathy. The thyroid gland, trachea, and esophagus are within normal limits. Lungs/Pleura: There is a small right pleural effusion. No pneumothorax. Diffuse patchy airspace disease and interlobular septal thickening are noted bilaterally, greater on the right than on the left. Upper Abdomen: No acute abnormality. Musculoskeletal: Sternotomy wires are noted. Degenerative changes are present in the thoracic spine. No acute osseous abnormality. Review of the MIP images confirms the above findings. IMPRESSION: 1. No evidence of pulmonary embolism. 2. Diffuse patchy airspace disease and interlobular septal thickening in the lungs bilaterally, possible edema versus multifocal pneumonia. 3. Small right pleural effusion. 4. Cardiomegaly with multi-vessel coronary artery calcifications. 5. Aortic  atherosclerosis. Electronically Signed   By: LBrett FairyM.D.   On: 07/30/2022 20:59   DG Chest 1 View  Result Date: 07/30/2022 CLINICAL DATA:  Cough EXAM: CHEST  1 VIEW COMPARISON:  10/09/2019 FINDINGS: Postop CABG.  Cardiac enlargement. Right upper lobe airspace disease is new and appears acute. This could be infection or edema. Mild vascular congestion. No pleural effusion. IMPRESSION: Right upper lobe infiltrate may represent edema or pneumonia. Cardiac enlargement with vascular congestion.  Electronically Signed   By: Franchot Gallo M.D.   On: 07/30/2022 18:17      IMPRESSION AND PLAN:  Assessment and Plan: * Sepsis due to pneumonia [facility]) - The patient will be admitted to the medical telemetry bed. - We will continue antibiotic therapy with IV Rocephin and Zithromax. - We will follow blood and cultures. - Mucolytic therapy and bronchodilator therapy will be provided. - O2 protocol will be followed.  Multifocal pneumonia - Management as above.  Acute respiratory failure with hypoxia (HCC) - The patient will be placed on O2 protocol. - This is clearly secondary to his community-acquired pneumonia. - Management as above.  Hypokalemia - Potassium will be replaced and magnesium level will be checked.  Essential hypertension - We will continue his antihypertensives.  Type 2 diabetes mellitus with peripheral neuropathy (HCC) - We will place him on supplemental coverage with NovoLog. - We will continue his basal coverage. - We will resume his Glucotrol XL, Jardiance and Januvia. - We will continue his Neurontin.   DVT prophylaxis: Lovenox.  Advanced Care Planning:  Code Status: full code.  Family Communication:  The plan of care was discussed in details with the patient (and family). I answered all questions. The patient agreed to proceed with the above mentioned plan. Further management will depend upon hospital course. Disposition Plan: Back to previous home  environment Consults called: none.  All the records are reviewed and case discussed with ED provider.  Status is: Inpatient  At the time of the admission, it appears that the appropriate admission status for this patient is inpatient.  This is judged to be reasonable and necessary in order to provide the required intensity of service to ensure the patient's safety given the presenting symptoms, physical exam findings and initial radiographic and laboratory data in the context of comorbid conditions.  The patient requires inpatient status due to high intensity of service, high risk of further deterioration and high frequency of surveillance required.  I certify that at the time of admission, it is my clinical judgment that the patient will require inpatient hospital care extending more than 2 midnights.                            Dispo: The patient is from: Home              Anticipated d/c is to: Home              Patient currently is not medically stable to d/c.              Difficult to place patient: No  Christel Mormon M.D on 07/30/2022 at 10:52 PM  [facility]   From 7 PM-7 AM, contact night-coverage [email or web address]  CC: Primary care physician; Michela Pitcher, NP

## 2022-07-30 NOTE — Telephone Encounter (Signed)
See mychart message response from patient. Sent to provider

## 2022-07-30 NOTE — ED Triage Notes (Signed)
Pt BIB [facility] from home, initially 74% on RA, up to 94% on 4L. Hx of abx use for resp symp. And Hx of weakness in legs after starting Wellbutrin 4 months ago. Here today for SOB.

## 2022-07-30 NOTE — Assessment & Plan Note (Signed)
Management as above °

## 2022-07-30 NOTE — Assessment & Plan Note (Signed)
-   The patient will be placed on O2 protocol. - This is clearly secondary to his community-acquired pneumonia. - Management as above.

## 2022-07-30 NOTE — ED Provider Notes (Signed)
[name] EMERGENCY DEPARTMENT Provider Note   CSN: [id number] Arrival date & time: 07/30/22  1716     History  Chief Complaint  Patient presents with   Shortness of Breath    [name] is a 61 y.o. male.  The history is provided by the patient, the EMS personnel and medical records. No language interpreter was used.  Weakness    61 year old male sending history of diabetes, hypertension, NSTEMI, paroxysmal atrial fibrillation, brought here via EMS from home for evaluation of weakness.  Patient is a poor historian.  He reported for the past 4 weeks he has had recurrent cough with generalized fatigue.  Cough is occasionally productive.  Felt that he may have been sick due to having sick contact.  He did reach out to his PCP who was prescribing antibiotic, doxycycline for potential pneumonia.  Patient states he takes it for approximately 1 week without any improvement.  He did reach out to his PCP who prescribed Augmentin but have not stopped the medication yet.  For the past 3 days patient states he endorsed increasing weakness, having trouble with imbalance and overall not feeling well.  Endorse fever and fatigue.  He does not endorse any headache or chest pain no nausea vomiting diarrhea or dysuria.  EMS was called to the house today and states that he was hypoxic with O2 sats in the 70s and patient was placed on supplemental oxygen.  He normally does not wear oxygen.  He denies having significant shortness of breath.  He denies alcohol use.  He admits to heavy tobacco use including 2 packs a day.   Home Medications Prior to Admission medications   Medication Sig Start Date End Date Taking? Authorizing Provider  acetaminophen (TYLENOL) 500 MG tablet Take 1,000 mg by mouth every 6 (six) hours as needed for headache.    [provider]  amoxicillin-clavulanate (AUGMENTIN) 875-125 MG tablet Take 1 tablet by mouth 2 (two) times daily for 7 days. 07/30/22  08/06/22  Michela Pitcher, NP  aspirin 81 MG chewable tablet Chew 1 tablet (81 mg total) by mouth daily. 06/19/16   Rai, Vernelle Emerald, MD  atorvastatin (LIPITOR) 80 MG tablet Take 1 tablet (80 mg total) by mouth at bedtime. 11/17/21   Michela Pitcher, NP  buPROPion (WELLBUTRIN XL) 150 MG 24 hr tablet 450 mg. Takes 3 tablets once daily    [provider]  citalopram (CELEXA) 20 MG tablet Take 1 tablet by mouth daily. 08/14/21 08/14/22  [provider]  ezetimibe (ZETIA) 10 MG tablet Take 1 tablet by mouth daily. 02/20/20 03/05/22  [provider]  furosemide (LASIX) 20 MG tablet Take 20 mg by mouth daily as needed.    [provider]  gabapentin (NEURONTIN) 300 MG capsule TAKE 2 CAPSULES BY MOUTH AT BEDTIME 06/26/22   Michela Pitcher, NP  glipiZIDE (GLUCOTROL XL) 2.5 MG 24 hr tablet Take 1 tablet by mouth daily. Patient not taking: Reported on 07/15/2022 03/02/22 03/02/23  [provider]  glucose blood test strip 1 each by Other route as needed for other. For one touch ultra meter. DX: E11.9 02/20/19   Elby Beck, FNP  insulin isophane & regular human KwikPen (HUMULIN 70/30 KWIKPEN) (70-30) 100 UNIT/ML KwikPen Please take 50 units every morning and 70 units with dinner 02/16/22   [provider]  Insulin Pen Needle 32G X 4 MM MISC Use to inject Insulin daily. Dx: E11.9 01/05/18   Clarene Reamer  B, FNP  Insulin Syringe-Needle U-100 (INSULIN SYRINGE .3CC/31GX5/16") 31G X 5/16" 0.3 ML MISC 1 Units by Does not apply route 2 (two) times daily. 05/07/20   Elby Beck, FNP  JARDIANCE 25 MG TABS tablet Take 25 mg by mouth daily. 10/22/20   [provider]  Lancets Glory Rosebush ULTRASOFT) lancets Use as instructed 01/05/18   Elby Beck, FNP  metoCLOPramide (REGLAN) 5 MG tablet Take 1 tablet (5 mg total) by mouth daily as needed for nausea. 01/08/22   Lucilla Lame, MD  metoprolol succinate (TOPROL-XL) 100 MG 24 hr tablet Take 100 mg by mouth daily.  04/17/21   [provider]  nitroGLYCERIN (NITROSTAT) 0.4 MG SL tablet Place 1 tablet (0.4 mg total) under the tongue every 5 (five) minutes as needed for chest pain. 04/20/18 07/19/18  End, Harrell Gave, MD  sacubitril-valsartan (ENTRESTO) 24-26 MG Take 0.5 tablets by mouth 2 (two) times daily. 05/27/20   [provider]  sitaGLIPtin (JANUVIA) 100 MG tablet Take 1 tablet by mouth daily. 12/01/21   [provider]  traZODone (DESYREL) 50 MG tablet Take 100 mg by mouth at bedtime. 10/02/21 02/12/22  [provider]      Allergies    Latex    Review of Systems   Review of Systems  Neurological:  Positive for weakness.  All other systems reviewed and are negative.   Physical Exam Updated Vital Signs BP (!) 125/53   Pulse 79   Temp 99.5 F (37.5 C) (Oral)   Resp (!) 26   SpO2 94%  Physical Exam Vitals and nursing note reviewed.  Constitutional:      General: He is not in acute distress.    Appearance: He is well-developed.     Comments: Patient is tremulous and globally weak but otherwise in no acute respiratory distress.  HENT:     Head: Atraumatic.  Eyes:     Conjunctiva/sclera: Conjunctivae normal.  Cardiovascular:     Rate and Rhythm: Normal rate and regular rhythm.     Pulses: Normal pulses.     Heart sounds: Normal heart sounds.  Pulmonary:     Breath sounds: Rales (Rales heard to the left lower lung bases) present.  Abdominal:     Palpations: Abdomen is soft.     Tenderness: There is no abdominal tenderness.  Musculoskeletal:     Cervical back: Neck supple.     Right lower leg: No edema.     Left lower leg: No edema.  Skin:    Findings: No rash.  Neurological:     Mental Status: He is alert. He is disoriented.     ED Results / Procedures / Treatments   Labs (all labs ordered are listed, but only abnormal results are displayed) Labs Reviewed  COMPREHENSIVE METABOLIC PANEL - Abnormal; Notable for the following components:       Result Value   Potassium 2.9 (*)    Glucose, Bld 152 (*)    BUN 32 (*)    Creatinine, Ser 2.05 (*)    Calcium 7.9 (*)    Albumin 3.2 (*)    Total Bilirubin 2.4 (*)    GFR, Estimated 36 (*)    All other components within normal limits  CBC WITH DIFFERENTIAL/PLATELET - Abnormal; Notable for the following components:   WBC 11.9 (*)    Hemoglobin 12.8 (*)    RDW 16.2 (*)    Platelets 68 (*)    Neutro Abs 9.5 (*)    Monocytes Absolute  1.1 (*)    All other components within normal limits  I-STAT VENOUS BLOOD GAS, ED - Abnormal; Notable for the following components:   pO2, Ven 92 (*)    Bicarbonate 28.1 (*)    Acid-Base Excess 3.0 (*)    Potassium 2.9 (*)    Calcium, Ion 0.97 (*)    All other components within normal limits  RESP PANEL BY RT-PCR (FLU A&B, COVID) ARPGX2  CULTURE, BLOOD (ROUTINE X 2)  CULTURE, BLOOD (ROUTINE X 2)  LACTIC ACID, PLASMA  LACTIC ACID, PLASMA  URINALYSIS, ROUTINE W REFLEX MICROSCOPIC    EKG EKG Interpretation  Date/Time:  Thursday July 30 2022 17:26:33 EDT Ventricular Rate:  81 PR Interval:  167 QRS Duration: 109 QT Interval:  431 QTC Calculation: 501 R Axis:   65 Text Interpretation: Sinus rhythm Inferior infarct, old Anterior infarct, old Prolonged QT interval No significant change since last tracing Confirmed by Wandra Arthurs [phone]) on 07/30/2022 5:43:31 PM  Radiology CT Angio Chest PE W and/or Wo Contrast  Result Date: 07/30/2022 CLINICAL DATA:  Pulmonary embolism suspected, high probability. Shortness of breath and cough for 6 weeks. EXAM: CT ANGIOGRAPHY CHEST WITH CONTRAST TECHNIQUE: Multidetector CT imaging of the chest was performed using the standard protocol during bolus administration of intravenous contrast. Multiplanar CT image reconstructions and MIPs were obtained to evaluate the vascular anatomy. RADIATION DOSE REDUCTION: This exam was performed according to the departmental dose-optimization program which includes automated  exposure control, adjustment of the mA and/or kV according to patient size and/or use of iterative reconstruction technique. CONTRAST:  39m OMNIPAQUE IOHEXOL 350 MG/ML SOLN COMPARISON:  01/01/2022. FINDINGS: Cardiovascular: The heart is enlarged and there is no pericardial effusion. Multi-vessel coronary artery calcifications are noted. There is atherosclerotic calcification of the aorta without evidence of aneurysm. The pulmonary trunk is normal in caliber. No pulmonary artery filling defect is identified. Mediastinum/Nodes: Mediastinal and hilar lymphadenopathy is noted bilaterally. No axillary lymphadenopathy. The thyroid gland, trachea, and esophagus are within normal limits. Lungs/Pleura: There is a small right pleural effusion. No pneumothorax. Diffuse patchy airspace disease and interlobular septal thickening are noted bilaterally, greater on the right than on the left. Upper Abdomen: No acute abnormality. Musculoskeletal: Sternotomy wires are noted. Degenerative changes are present in the thoracic spine. No acute osseous abnormality. Review of the MIP images confirms the above findings. IMPRESSION: 1. No evidence of pulmonary embolism. 2. Diffuse patchy airspace disease and interlobular septal thickening in the lungs bilaterally, possible edema versus multifocal pneumonia. 3. Small right pleural effusion. 4. Cardiomegaly with multi-vessel coronary artery calcifications. 5. Aortic atherosclerosis. Electronically Signed   By: LBrett FairyM.D.   On: 07/30/2022 20:59   DG Chest 1 View  Result Date: 07/30/2022 CLINICAL DATA:  Cough EXAM: CHEST  1 VIEW COMPARISON:  10/09/2019 FINDINGS: Postop CABG.  Cardiac enlargement. Right upper lobe airspace disease is new and appears acute. This could be infection or edema. Mild vascular congestion. No pleural effusion. IMPRESSION: Right upper lobe infiltrate may represent edema or pneumonia. Cardiac enlargement with vascular congestion. Electronically Signed   By:  CFranchot GalloM.D.   On: 07/30/2022 18:17    Procedures .Critical Care  Performed by: TDomenic Moras PA-C Authorized by: TDomenic Moras PA-C   Critical care provider statement:    Critical care time (minutes):  45   Critical care was time spent personally by me on the following activities:  Development of treatment plan with patient or surrogate, discussions with consultants, evaluation of patient's  response to treatment, examination of patient, ordering and review of laboratory studies, ordering and review of radiographic studies, ordering and performing treatments and interventions, pulse oximetry, re-evaluation of patient's condition and review of old charts     Medications Ordered in ED Medications  cefTRIAXone (ROCEPHIN) 1 g in sodium chloride 0.9 % 100 mL IVPB (has no administration in time range)  azithromycin (ZITHROMAX) 500 mg in sodium chloride 0.9 % 250 mL IVPB (has no administration in time range)  nicotine (NICODERM CQ - dosed in mg/24 hours) patch 21 mg (has no administration in time range)  sodium chloride 0.9 % bolus 1,000 mL (1,000 mLs Intravenous New Bag/Given 07/30/22 1834)  acetaminophen (TYLENOL) tablet 1,000 mg (1,000 mg Oral Given 07/30/22 1816)  iohexol (OMNIPAQUE) 350 MG/ML injection 55 mL (55 mLs Intravenous Contrast Given 07/30/22 2028)    ED Course/ Medical Decision Making/ A&P                           Medical Decision Making Amount and/or Complexity of Data Reviewed Labs: ordered. Radiology: ordered.  Risk OTC drugs. Prescription drug management.   BP 128/84 (BP Location: Right Arm)   Pulse 80   Temp (!) 103 F (39.4 C) (Oral)   Resp 20   SpO2 96%   5:35 PM This is a 61 year old male with multiple comorbidities which includes diabetes, hypertension, NSTEMI, CAD status post CABG, paroxysmal A-fib, heavy tobacco use, who was brought here via EMS from home with concerns of weakness.  Patient is a poor historian.  He mentions 4 months ago he was  having some trouble with imbalance.  He is currently being seen by neurologist for that.  For the past month he has had progressive worsening productive cough and overall not feeling well.  He thought he may have lung infection as he was exposed to sick contact.  States PCP prescribed antibiotic several weeks ago for which she took the full course and noticed no improvement.  He did reach out to his PCP again who prescribed a different antibiotic.  However he have not started on that medication yet.  He endorsed progressive worsening fatigue, imbalance, and EMS was called today to the house.  EMS noted that patient was hypoxic with O2 sats in the 70s and supplemental oxygen was placed.  Patient does not normally wear supplemental oxygen.  He does not endorse any significant fever denies any chest pain or shortness of breath no nausea vomiting diarrhea or dysuria.  He does not endorse any focal numbness or focal weakness.  He does not endorse dizziness at rest but states he feels unsteady when he moves around.  He denies any abnormal bleeding no black tarry stool.  On exam this is an elderly male appears tremulous and globally weak but in no acute respiratory discomfort.  He is currently wearing supplemental oxygen at 4 L satting at 98%.  Lung exam remarkable for crackles to the left lower lung base.  He does not appears to be fluid overloaded.  His abdomen is soft nontender.  He does not have any focal neurodeficit.  He is alert and oriented x3.  Vital signs remarkable for an elevated temperature of 103 while patient maintaining a normal blood pressure of 128/84 and heart rate of 80.  Tylenol given for his fever.  I am concerned for potential pneumonia causing his fever.  I have ordered a COVID test as well as chest x-ray and will consider chest  CT angiogram due to his severe hypoxia on initial exam by EMS.  Gentle fluid bolus given.  Care discussed with Dr. Darl Householder.  9:30 PM Labs and imaging and EKG obtained  independently viewed interpreted by me and I agree with radiologist interpretation.  Patient's venous blood gas with normal pH.  Respiratory panel was negative for COVID and flu.  Initial lactic acid is 1.3.  Mild hypokalemia with potassium of 2.9.  Creatinine is 2.05 similar to baseline.  White count is elevated at 11.9.  Chest CT angiogram showed an no evidence of PE however patient does have evidence concerning for multifocal pneumonia with a small right pleural effusion.  EKG shows prolonged QT of 501.  In the setting of elevated temperature, elevated white count, signs of pneumonia, patient meets sepsis criteria but does not need fluid resuscitation at 30 mill per kilogram.  Patient have received IV antibiotic and antipyretic.  Patient will need to be admitted for acute respiratory failure with hypoxia in the setting of multifocal pneumonia.   I appreciate consultation from hospitalist, Dr. Sidney Ace who agrees to see and will admit patient for further care.    This patient presents to the ED for concern of sob, this involves an extensive number of treatment options, and is a complaint that carries with it a high risk of complications and morbidity.  The differential diagnosis includes PNA, PE, PTX, ACS, anemia, covid  Co morbidities that complicate the patient evaluation DM  HTN  Tobacco abuse Additional history obtained:  Additional history obtained from daughter External records from outside source obtained and reviewed including EMR  Lab Tests:  I Ordered, and personally interpreted labs.  The pertinent results include:  as above  Imaging Studies ordered:  I ordered imaging studies including Chest CTA I independently visualized and interpreted imaging which showed multifocal pneumonia I agree with the radiologist interpretation  Cardiac Monitoring:  The patient was maintained on a cardiac monitor.  I personally viewed and interpreted the cardiac monitored which showed an underlying  rhythm of: NSR  Medicines ordered and prescription drug management:  I ordered medication including rocephin/zithromax  for pneumonia Reevaluation of the patient after these medicines showed that the patient improved I have reviewed the patients home medicines and have made adjustments as needed  Test Considered: as above  Critical Interventions: sepsis protocol  Consultations Obtained:  I requested consultation with the hospitalist Dr. Sidney Ace,  and discussed lab and imaging findings as well as pertinent plan - they recommend: admission  Problem List / ED Course: pneumonia  hypoxia  Reevaluation:  After the interventions noted above, I reevaluated the patient and found that they have :improved  Social Determinants of Health: tobacco abuse, recommend tobacco cessation  Dispostion:  After consideration of the diagnostic results and the patients response to treatment, I feel that the patent would benefit from admission.          Final Clinical Impression(s) / ED Diagnoses Final diagnoses:  Sepsis due to pneumonia [facility])  Hypokalemia    Rx / DC Orders ED Discharge Orders     None         Domenic Moras, PA-C 07/30/22 2139    Drenda Freeze, MD 08/04/22 [phone]

## 2022-07-31 ENCOUNTER — Encounter (HOSPITAL_COMMUNITY): Payer: Self-pay | Admitting: Family Medicine

## 2022-07-31 ENCOUNTER — Inpatient Hospital Stay (HOSPITAL_COMMUNITY): Payer: Medicare Other

## 2022-07-31 ENCOUNTER — Other Ambulatory Visit: Payer: Self-pay

## 2022-07-31 DIAGNOSIS — J9601 Acute respiratory failure with hypoxia: Secondary | ICD-10-CM | POA: Diagnosis not present

## 2022-07-31 DIAGNOSIS — R0602 Shortness of breath: Secondary | ICD-10-CM

## 2022-07-31 DIAGNOSIS — I10 Essential (primary) hypertension: Secondary | ICD-10-CM | POA: Diagnosis not present

## 2022-07-31 DIAGNOSIS — I509 Heart failure, unspecified: Secondary | ICD-10-CM | POA: Diagnosis not present

## 2022-07-31 DIAGNOSIS — A419 Sepsis, unspecified organism: Secondary | ICD-10-CM | POA: Diagnosis not present

## 2022-07-31 DIAGNOSIS — J189 Pneumonia, unspecified organism: Secondary | ICD-10-CM | POA: Diagnosis not present

## 2022-07-31 LAB — CBC WITH DIFFERENTIAL/PLATELET
Abs Immature Granulocytes: 0.04 10*3/uL (ref 0.00–0.07)
Basophils Absolute: 0 10*3/uL (ref 0.0–0.1)
Basophils Relative: 0 %
Eosinophils Absolute: 0 10*3/uL (ref 0.0–0.5)
Eosinophils Relative: 0 %
HCT: 39.9 % (ref 39.0–52.0)
Hemoglobin: 12.7 g/dL — ABNORMAL LOW (ref 13.0–17.0)
Immature Granulocytes: 0 %
Lymphocytes Relative: 8 %
Lymphs Abs: 0.8 10*3/uL (ref 0.7–4.0)
MCH: 28.8 pg (ref 26.0–34.0)
MCHC: 31.8 g/dL (ref 30.0–36.0)
MCV: 90.5 fL (ref 80.0–100.0)
Monocytes Absolute: 0.9 10*3/uL (ref 0.1–1.0)
Monocytes Relative: 9 %
Neutro Abs: 7.9 10*3/uL — ABNORMAL HIGH (ref 1.7–7.7)
Neutrophils Relative %: 83 %
Platelets: 66 10*3/uL — ABNORMAL LOW (ref 150–400)
RBC: 4.41 MIL/uL (ref 4.22–5.81)
RDW: 16.4 % — ABNORMAL HIGH (ref 11.5–15.5)
WBC: 9.6 10*3/uL (ref 4.0–10.5)
nRBC: 0 % (ref 0.0–0.2)

## 2022-07-31 LAB — RESPIRATORY PANEL BY PCR

## 2022-07-31 LAB — BASIC METABOLIC PANEL WITH GFR
Anion gap: 11 (ref 5–15)
BUN: 31 mg/dL — ABNORMAL HIGH (ref 8–23)
CO2: 24 mmol/L (ref 22–32)
Calcium: 7.4 mg/dL — ABNORMAL LOW (ref 8.9–10.3)
Chloride: 108 mmol/L (ref 98–111)
Creatinine, Ser: 1.85 mg/dL — ABNORMAL HIGH (ref 0.61–1.24)
GFR, Estimated: 41 mL/min — ABNORMAL LOW
Glucose, Bld: 140 mg/dL — ABNORMAL HIGH (ref 70–99)
Potassium: 3.2 mmol/L — ABNORMAL LOW (ref 3.5–5.1)
Sodium: 143 mmol/L (ref 135–145)

## 2022-07-31 LAB — URINALYSIS, ROUTINE W REFLEX MICROSCOPIC
Bilirubin Urine: NEGATIVE
Glucose, UA: >=500 mg/dL — AB
Hgb urine dipstick: NEGATIVE
Ketones, ur: NEGATIVE mg/dL
Leukocytes,Ua: NEGATIVE
Nitrite: NEGATIVE
Protein, ur: 100 mg/dL — AB
Specific Gravity, Urine: 1.021 (ref 1.005–1.030)
pH: 5 (ref 5.0–8.0)

## 2022-07-31 LAB — GLUCOSE, CAPILLARY
Glucose-Capillary: 143 mg/dL — ABNORMAL HIGH (ref 70–99)
Glucose-Capillary: 122 mg/dL — ABNORMAL HIGH (ref 70–99)

## 2022-07-31 LAB — CBC
HCT: 37.3 % — ABNORMAL LOW (ref 39.0–52.0)
Hemoglobin: 12.1 g/dL — ABNORMAL LOW (ref 13.0–17.0)
MCH: 28.7 pg (ref 26.0–34.0)
MCHC: 32.4 g/dL (ref 30.0–36.0)
MCV: 88.6 fL (ref 80.0–100.0)
Platelets: 61 10*3/uL — ABNORMAL LOW (ref 150–400)
RBC: 4.21 MIL/uL — ABNORMAL LOW (ref 4.22–5.81)
RDW: 16.3 % — ABNORMAL HIGH (ref 11.5–15.5)
WBC: 9.5 10*3/uL (ref 4.0–10.5)
nRBC: 0 % (ref 0.0–0.2)

## 2022-07-31 LAB — PROTIME-INR
INR: 1.3 — ABNORMAL HIGH (ref 0.8–1.2)
Prothrombin Time: 15.8 s — ABNORMAL HIGH (ref 11.4–15.2)

## 2022-07-31 LAB — HIV ANTIBODY (ROUTINE TESTING W REFLEX): HIV Screen 4th Generation wRfx: NONREACTIVE

## 2022-07-31 LAB — ECHOCARDIOGRAM COMPLETE: S' Lateral: 3.4 cm

## 2022-07-31 LAB — CBG MONITORING, ED
Glucose-Capillary: 210 mg/dL — ABNORMAL HIGH (ref 70–99)
Glucose-Capillary: 212 mg/dL — ABNORMAL HIGH (ref 70–99)

## 2022-07-31 LAB — C-REACTIVE PROTEIN: CRP: 29.7 mg/dL — ABNORMAL HIGH

## 2022-07-31 LAB — CORTISOL-AM, BLOOD: Cortisol - AM: 17.8 ug/dL (ref 6.7–22.6)

## 2022-07-31 LAB — PROCALCITONIN
Procalcitonin: 0.74 ng/mL
Procalcitonin: 0.74 ng/mL

## 2022-07-31 LAB — BRAIN NATRIURETIC PEPTIDE: B Natriuretic Peptide: 896.2 pg/mL — ABNORMAL HIGH (ref 0.0–100.0)

## 2022-07-31 LAB — SEDIMENTATION RATE: Sed Rate: 48 mm/hr — ABNORMAL HIGH (ref 0–16)

## 2022-07-31 MED ORDER — POTASSIUM CHLORIDE CRYS ER 20 MEQ PO TBCR
40.0000 meq | EXTENDED_RELEASE_TABLET | Freq: Once | ORAL | Status: AC
  Administered 2022-07-31: 40 meq via ORAL
  Filled 2022-07-31: qty 2

## 2022-07-31 MED ORDER — INSULIN ASPART 100 UNIT/ML IJ SOLN
0.0000 [IU] | Freq: Three times a day (TID) | INTRAMUSCULAR | Status: DC
  Administered 2022-08-01: 1 [IU] via SUBCUTANEOUS
  Administered 2022-08-02 (×3): 3 [IU] via SUBCUTANEOUS

## 2022-07-31 NOTE — Consult Note (Signed)
NAME:  [name], MRN:  [id number], DOB:  July 05, 1961, LOS: 1 ADMISSION DATE:  07/30/2022, CONSULTATION DATE:  07/31/22 REFERRING MD:  TRH CHIEF COMPLAINT:  Acute Hypoxic RF   History of Present Illness:  Patient is a 61 year old male with history noted below presenting to the ED with weakness and cough.  Patient states the weakness has been present for around 6 months time and is mostly when he is ambulatory.  He has been having productive cough for around 5 weeks.  Patient states started with clear sputum then turned yellow then green.  He states whenever he takes antibiotics that proves the cough but it comes back after the antibiotics finish.  He is endorsing multiple sick contacts and states somebody in the house is always.  He states he does have sinus congestion but no fevers or chills, sore throat, nausea, vomiting, or diarrhea.  He does not report any postnasal drip.  States he did not know he was short of breath at home until he came to the ED.  His primary care doctor thought he might have COPD but has not performed any testing and he is not on any inhalers for this. Pertinent  Medical History  HFrEF, PAF-s/p ablation-not on AC, around 60 pack year smoking history, CAD s/p CABG, DM-2, HTN, HLD Significant Hospital Events: Including procedures, antibiotic start and stop dates in addition to other pertinent events    Interim History / Subjective:  States coughing is the same. It gets better with abx but doesn't resolve.  Review of Systems:   Negative unless stated in the HPI/Subjective.  Objective   Blood pressure 123/71, pulse 75, temperature 98.2 F (36.8 C), temperature source Oral, resp. rate 20, SpO2 94 %.        Intake/Output Summary (Last 24 hours) at 07/31/2022 1134 Last data filed at 07/31/2022 0141 Gross per 24 hour  Intake 250 ml  Output 1200 ml  Net -950 ml   There were no vitals filed for this visit. Examination: General: NAD HENT: NCAT, on 2 L  Lungs:  CTAB, diminished at bases Cardiovascular: RRR, good pulses Abdomen: No TTP, normal bowel sounds Extremities: No asymmetry, good strength, tremor in bilateral upper extremities.  Neuro: alert and oriented.  GU: no foley.   Assessment & Plan:  Acute hypoxic respiratory failure Patient with subacute cough, fever on admission and exposure to sick contacts with diagnostic findings of leukocytosis, right upper lobe infiltrate elevated procalcitonin level concerning for bacterial pneumonia.  Given his history probably had viral URI followed by bacterial infection.  CTA negative for PE. Agree with the plan for antibiotics, blood cultures, sputum culture. If no growth on sputum culture, then we can proceed with bronchoscopy while inpatient.  -Continue abx -Follow up cultures, pending labs.  -Monitor fever curve -Continue with mucolytic and expectorant.  -Will continue to monitor.   All other problems per primary team  Labs   CBC: Recent Labs  Lab 07/30/22 1825 07/30/22 1916 07/31/22 0548  WBC 11.9*  --  9.5  NEUTROABS 9.5*  --   --   HGB 12.8* 13.6 12.1*  HCT 40.2 40.0 37.3*  MCV 88.7  --  88.6  PLT 68*  --  61*   Basic Metabolic Panel: Recent Labs  Lab 07/30/22 1825 07/30/22 1916 07/31/22 0548  NA 138 140 143  K 2.9* 2.9* 3.2*  CL 101  --  108  CO2 26  --  24  GLUCOSE 152*  --  140*  BUN 32*  --  31*  CREATININE 2.05*  --  1.85*  CALCIUM 7.9*  --  7.4*   GFR: CrCl cannot be calculated (Unknown ideal weight.). Recent Labs  Lab 07/30/22 1825 07/31/22 0548  PROCALCITON  --  0.74  WBC 11.9* 9.5  LATICACIDVEN 1.3  --    Liver Function Tests: Recent Labs  Lab 07/30/22 1825  AST 32  ALT 21  ALKPHOS 60  BILITOT 2.4*  PROT 6.5  ALBUMIN 3.2*   No results for input(s): "LIPASE", "AMYLASE" in the last 168 hours. No results for input(s): "AMMONIA" in the last 168 hours. ABG    Component Value Date/Time   PHART 7.311 (L) 08/26/2016 1950   PCO2ART 49.2 (H)  08/26/2016 1950   PO2ART 62.0 (L) 08/26/2016 1950   HCO3 28.1 (H) 07/30/2022 1916   TCO2 30 07/30/2022 1916   ACIDBASEDEF 2.0 08/26/2016 1950   O2SAT 97 07/30/2022 1916    Coagulation Profile: Recent Labs  Lab 07/31/22 0548  INR 1.3*   Cardiac Enzymes: No results for input(s): "CKTOTAL", "CKMB", "CKMBINDEX", "TROPONINI" in the last 168 hours. HbA1C: Hemoglobin A1C  Date/Time Value Ref Range Status  07/13/2022 12:00 AM 7.0  Final  07/21/2021 12:00 AM 9.5  Final    Comment:    in care everywhere-[facility]   CBG: Recent Labs  Lab 07/31/22 0850  GLUCAP 210*   Past Medical History:  He,  has a past medical history of Abrasion (L [place]), Atrial fibrillation ([place]), CAD (coronary artery disease), Cancer ([place]), Diabetes mellitus without complication ([place]), ZJQBHALP(379.0), Hematuria, microscopic ("SINCE I WAS A KID"), Hyperlipidemia, Hypertension, Ischemic cardiomyopathy, Lactose intolerance, Myocardial infarction ([place]), Respiratory failure ([place]), Rotator cuff tear (RIGHT), and Tobacco use.  Surgical History:   Past Surgical History:  Procedure Laterality Date   AMPUTATION TOE Left 02/06/2020   5th ray amputation with graph [facility])   CARDIAC CATHETERIZATION N/A 06/16/2016   Procedure: Left Heart Cath and Coronary Angiography;  Surgeon: Troy Sine, MD;  Location: [place] CV [facility];  Service: Cardiovascular;  Laterality: N/A;   CLIPPING OF ATRIAL APPENDAGE N/A 08/24/2016   Procedure: CLIPPING OF ATRIAL APPENDAGE;  Surgeon: Ivin Poot, MD;  Location: [place];  Service: Open Heart Surgery;  Laterality: N/A;   COLONOSCOPY WITH PROPOFOL N/A 02/12/2022   Procedure: COLONOSCOPY WITH PROPOFOL;  Surgeon: Lucilla Lame, MD;  Location: [facility] ENDOSCOPY;  Service: Endoscopy;  Laterality: N/A;   CORONARY ARTERY BYPASS GRAFT N/A 08/24/2016   Procedure: CORONARY ARTERY BYPASS GRAFTING (CABG) x three,  using left internal mammary artery and right leg greater saphenous vein harvested  endoscopically;  Surgeon: Ivin Poot, MD;  Location: [place];  Service: Open Heart Surgery;  Laterality: N/A;   HERNIA REPAIR  X2   INCISION AND DRAINAGE ABSCESS Left 06/10/2016   Procedure: INCISION AND DRAINAGE LEFT AXILLARY ABSCESS;  Surgeon: Autumn Messing III, MD;  Location: [facility];  Service: General;  Laterality: Left;   ROTATOR CUFF REPAIR Bilateral    TEE WITHOUT CARDIOVERSION N/A 08/24/2016   Procedure: TRANSESOPHAGEAL ECHOCARDIOGRAM (TEE);  Surgeon: Ivin Poot, MD;  Location: [place];  Service: Open Heart Surgery;  Laterality: N/A;    Social History:   reports that he has been smoking cigarettes. He has a 84.00 pack-year smoking history. He has never used smokeless tobacco. He reports that he does not drink alcohol and does not use drugs.  Family History:  His family history includes Cancer in his maternal aunt; Diabetes in his father;  Heart attack in his father and sister; Hypertension in his father and mother; Sudden death (age of onset: 34) in his father.  Allergies Allergies  Allergen Reactions   Latex Rash    Home Medications  Prior to Admission medications   Medication Sig Start Date End Date Taking? Authorizing Provider  acetaminophen (TYLENOL) 500 MG tablet Take 1,000 mg by mouth every 6 (six) hours as needed for headache.   Yes [provider]  aspirin 81 MG chewable tablet Chew 1 tablet (81 mg total) by mouth daily. 06/19/16  Yes Rai, Ripudeep K, MD  atorvastatin (LIPITOR) 80 MG tablet Take 1 tablet (80 mg total) by mouth at bedtime. 11/17/21  Yes Michela Pitcher, NP  buPROPion (WELLBUTRIN XL) 150 MG 24 hr tablet Take 150-300 mg by mouth See admin instructions. Take 2 tablets by mouth daily for 1 week, then take 1 tablet daily for 1 week   Yes [provider]  citalopram (CELEXA) 20 MG tablet Take 1 tablet by mouth daily. 08/14/21 08/14/22 Yes [provider]  ezetimibe (ZETIA) 10 MG tablet Take 1 tablet by mouth daily. 02/20/20 07/31/23 Yes [provider]  furosemide (LASIX) 20 MG tablet Take 20 mg by mouth every other day.   Yes [provider]  gabapentin (NEURONTIN) 300 MG capsule TAKE 2 CAPSULES BY MOUTH AT BEDTIME Patient taking differently: Take 600 mg by mouth at bedtime. 06/26/22  Yes Michela Pitcher, NP  insulin isophane & regular human KwikPen (HUMULIN 70/30 KWIKPEN) (70-30) 100 UNIT/ML KwikPen Inject 50-60 Units into the skin See admin instructions. Please take 50 units every morning and 60 units with dinner 02/16/22  Yes [provider]  JARDIANCE 25 MG TABS tablet Take 25 mg by mouth daily. 10/22/20  Yes [provider]  metoCLOPramide (REGLAN) 5 MG tablet Take 1 tablet (5 mg total) by mouth daily as needed for nausea. 01/08/22  Yes Lucilla Lame, MD  metoprolol succinate (TOPROL-XL) 100 MG 24 hr tablet Take 100 mg by mouth daily. 04/17/21  Yes [provider]  sacubitril-valsartan (ENTRESTO) 24-26 MG Take 0.5 tablets by mouth 2 (two) times daily. 05/27/20  Yes [provider]  sitaGLIPtin (JANUVIA) 100 MG tablet Take 1 tablet by mouth daily. 12/01/21  Yes [provider]  traZODone (DESYREL) 50 MG tablet Take 100 mg by mouth at bedtime. 10/02/21 07/31/23 Yes [provider]  amoxicillin-clavulanate (AUGMENTIN) 875-125 MG tablet Take 1 tablet by mouth 2 (two) times daily for 7 days. Patient not taking: Reported on 07/30/2022 07/30/22 08/06/22  Michela Pitcher, NP  fluorouracil (EFUDEX) 5 % cream Apply 1 Application topically 2 (two) times daily. Patient not taking: Reported on 07/30/2022 07/27/22   [provider]  glucose blood test strip 1 each by Other route as needed for other. For one touch ultra meter. DX: E11.9 02/20/19   Elby Beck, FNP  Insulin Pen Needle 32G X 4 MM MISC Use to inject Insulin daily. Dx: E11.9 01/05/18   Elby Beck, FNP  Insulin Syringe-Needle U-100 (INSULIN SYRINGE .3CC/31GX5/16") 31G X 5/16" 0.3 ML MISC 1 Units by Does not  apply route 2 (two) times daily. 05/07/20   Elby Beck, FNP  Lancets [facility] ULTRASOFT) lancets Use as instructed 01/05/18   Elby Beck, FNP  nitroGLYCERIN (NITROSTAT) 0.4 MG SL tablet Place 1 tablet (0.4 mg total) under the tongue every 5 (five) minutes as needed for chest pain. Patient not taking: Reported on 07/30/2022 04/20/18 07/31/23  Nelva Bush, MD    Idamae Schuller, MD Tillie Rung. [facility] Internal Medicine Residency, PGY-2

## 2022-07-31 NOTE — ED Notes (Signed)
Pt got up to use bathroom, pt denies SOB before and after using bathroom, steady gait noted. Upon returning to bed patient's O2 saturation sitting at 84%. Writer educated patient on necessity for O2 at this time and recommended use of bedside commode for the time-being. Pt agreeable.

## 2022-07-31 NOTE — Evaluation (Signed)
Physical Therapy Evaluation Patient Details Name: [name] MRN: [id number] DOB: [birth year] Today's Date: 07/31/2022  History of Present Illness  61 y.o.  male with history of HFrEF, PAF-s/p ablation, CAD s/p CABG, DM-2, HTN, HLD, chronic tobacco use who presented with worsening cough, weakness, shortness of breath-he was found to have PNA. Per chart has had cough/cold like symptoms for 4-6 weeks and given Doxycycline without improvement.  Clinical Impression  Patient presents with mobility close to baseline.  Ambulating in hallway on 1L O2 with SpO2 90% and minimal if any signs of dyspnea.  Patient currently without need for skilled PT will sign off.        Recommendations for follow up therapy are one component of a multi-disciplinary discharge planning process, led by the attending physician.  Recommendations may be updated based on patient status, additional functional criteria and insurance authorization.  Follow Up Recommendations No PT follow up      Assistance Recommended at Discharge Intermittent Supervision/Assistance  Patient can return home with the following       Equipment Recommendations None recommended by PT  Recommendations for Other Services       Functional Status Assessment Patient has not had a recent decline in their functional status     Precautions / Restrictions Precautions Precaution Comments: watch SpO2      Mobility  Bed Mobility Overal bed mobility: Modified Independent                  Transfers Overall transfer level: Modified independent                      Ambulation/Gait Ambulation/Gait assistance: Independent Gait Distance (Feet): 200 Feet Assistive device: None Gait Pattern/deviations: Step-through pattern       General Gait Details: no issues with turning around due to wrapped up in O2 cord; SpO2 90% ambulating on 1L O2  Stairs            Wheelchair Mobility    Modified Rankin (Stroke Patients  Only)       Balance Overall balance assessment: Modified Independent                                           Pertinent Vitals/Pain Pain Assessment Pain Assessment: No/denies pain    Home Living Family/patient expects to be discharged to:: Private residence Living Arrangements: Parent Available Help at Discharge: Family Type of Home: House Home Access: Stairs to enter Entrance Stairs-Rails: Right Entrance Stairs-Number of Steps: 3   Home Layout: One level Home Equipment: Conservation officer, nature (2 wheels);Cane - single point      Prior Function Prior Level of Function : Independent/Modified Independent             Mobility Comments: on disability, lives with mom who is independent and helps him sometimes, but she has been diagnosed with breast cancer       Hand Dominance   Dominant Hand: Right    Extremity/Trunk Assessment   Upper Extremity Assessment Upper Extremity Assessment: Overall WFL for tasks assessed    Lower Extremity Assessment Lower Extremity Assessment: Overall WFL for tasks assessed       Communication   Communication: No difficulties  Cognition Arousal/Alertness: Awake/alert Behavior During Therapy: WFL for tasks assessed/performed Overall Cognitive Status: Within Functional Limits for tasks assessed  General Comments General comments (skin integrity, edema, etc.): cough productive of blood tinged sputum and pt spit in specimin cup, RN aware    Exercises     Assessment/Plan    PT Assessment Patient does not need any further PT services  PT Problem List         PT Treatment Interventions      PT Goals (Current goals can be found in the Care Plan section)  Acute Rehab PT Goals PT Goal Formulation: All assessment and education complete, DC therapy    Frequency       Co-evaluation               AM-PAC PT "6 Clicks" Mobility  Outcome Measure Help  needed turning from your back to your side while in a flat bed without using bedrails?: None Help needed moving from lying on your back to sitting on the side of a flat bed without using bedrails?: None Help needed moving to and from a bed to a chair (including a wheelchair)?: None Help needed standing up from a chair using your arms (e.g., wheelchair or bedside chair)?: None Help needed to walk in hospital room?: None Help needed climbing 3-5 steps with a railing? : None 6 Click Score: 24    End of Session Equipment Utilized During Treatment: Gait belt Activity Tolerance: Patient tolerated treatment well Patient left: in bed;with nursing/sitter in room   PT Visit Diagnosis: Other abnormalities of gait and mobility (R26.89)    Time: 1332-1400 PT Time Calculation (min) (ACUTE ONLY): 28 min   Charges:   PT Evaluation $PT Eval Low Complexity: 1 Low PT Treatments $Gait Training: 8-22 mins        [name], PT Acute Rehabilitation Services Office:[phone] 07/31/2022   Reginia Naas 07/31/2022, 3:39 PM

## 2022-07-31 NOTE — Progress Notes (Signed)
  Echocardiogram 2D Echocardiogram has been performed.  [name] 07/31/2022, 5:40 PM
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
## 2022-07-31 NOTE — ED Notes (Signed)
ED TO INPATIENT HANDOFF REPORT  ED Nurse Name and Phone #: Imajean Mcdermid RN [phone]  S Name/Age/Gender [name] 61 y.o. male Room/Bed: 005C/005C  Code Status   Code Status: Full Code  Home/SNF/Other Home Patient oriented to: self, place, time, and situation Is this baseline? Yes   Triage Complete: Triage complete  Chief Complaint Sepsis due to pneumonia ([place]) [J18.9, A41.9]  Triage Note Pt BIB [facility] from home, initially 74% on RA, up to 94% on 4L. Hx of abx use for resp symp. And Hx of weakness in legs after starting Wellbutrin 4 months ago. Here today for SOB.   Allergies Allergies  Allergen Reactions   Latex Rash    Level of Care/Admitting Diagnosis ED Disposition     ED Disposition  Admit   Condition  --   [place]: [place] [[id number]]  Level of Care: Telemetry Medical [104]  May admit patient to [facility] or [facility] if equivalent level of care is available:: No  Covid Evaluation: Asymptomatic - no recent exposure (last 10 days) testing not required  Diagnosis: Sepsis due to pneumonia [facility]) [[id number]]  Admitting Physician: Christel Mormon [[id number]]  Attending Physician: Christel Mormon [[id number]]  Certification:: I certify this patient will need inpatient services for at least 2 midnights  Estimated Length of Stay: 2          B Medical/Surgery History Past Medical History:  Diagnosis Date   Abrasion L FOREARM   Atrial fibrillation ([place])    a. initially occurring in post-op setting in 05/2016, started on Eliquis b. 08/2016: s/p clipping of atrial appendage at time of CABG --> Eliquis switched to Coumadin   CAD (coronary artery disease)    a. 05/2016: NSTEMI, cath showing 3v disease with CABG recommended once MRSA infection resolved. b. 08/2016: CABG with LIMA-LAD, SVG-D1, and SVG-RI.   Cancer ([place])    basal cell carcinoma of the skin   Diabetes mellitus without complication ([place])    QMVHQION(629.5)    Hematuria,  microscopic "SINCE I WAS A KID"   Hyperlipidemia    Hypertension    Ischemic cardiomyopathy    a. 05/2016: echo w/ EF of 25-35% and akinesis of the basal-midinferolateral and inferior myocardium.   Lactose intolerance    Myocardial infarction ([place])    Respiratory failure ([facility])    Rotator cuff tear RIGHT   Tobacco use    a. quit in 05/2016 following MI   Past Surgical History:  Procedure Laterality Date   AMPUTATION TOE Left 02/06/2020   5th ray amputation with graph [facility])   CARDIAC CATHETERIZATION N/A 06/16/2016   Procedure: Left Heart Cath and Coronary Angiography;  Surgeon: Troy Sine, MD;  Location: [place] CV LAB;  Service: Cardiovascular;  Laterality: N/A;   CLIPPING OF ATRIAL APPENDAGE N/A 08/24/2016   Procedure: CLIPPING OF ATRIAL APPENDAGE;  Surgeon: Ivin Poot, MD;  Location: [place];  Service: Open Heart Surgery;  Laterality: N/A;   COLONOSCOPY WITH PROPOFOL N/A 02/12/2022   Procedure: COLONOSCOPY WITH PROPOFOL;  Surgeon: Lucilla Lame, MD;  Location: [facility] ENDOSCOPY;  Service: Endoscopy;  Laterality: N/A;   CORONARY ARTERY BYPASS GRAFT N/A 08/24/2016   Procedure: CORONARY ARTERY BYPASS GRAFTING (CABG) x three,  using left internal mammary artery and right leg greater saphenous vein harvested endoscopically;  Surgeon: Ivin Poot, MD;  Location: [place];  Service: Open Heart Surgery;  Laterality: N/A;   HERNIA REPAIR  X2   INCISION AND DRAINAGE  ABSCESS Left 06/10/2016   Procedure: INCISION AND DRAINAGE LEFT AXILLARY ABSCESS;  Surgeon: Autumn Messing III, MD;  Location: [facility];  Service: General;  Laterality: Left;   ROTATOR CUFF REPAIR Bilateral    TEE WITHOUT CARDIOVERSION N/A 08/24/2016   Procedure: TRANSESOPHAGEAL ECHOCARDIOGRAM (TEE);  Surgeon: Ivin Poot, MD;  Location: [place];  Service: Open Heart Surgery;  Laterality: N/A;     A IV Location/Drains/Wounds Patient Lines/Drains/Airways Status     Active Line/Drains/Airways     Name Placement date Placement  time Site Days   Peripheral IV 07/31/22 20 G 1" Anterior;Left Forearm 07/31/22  0110  Forearm  less than 1            Intake/Output Last 24 hours  Intake/Output Summary (Last 24 hours) at 07/31/2022 1326 Last data filed at 07/31/2022 0141 Gross per 24 hour  Intake 250 ml  Output 1200 ml  Net -950 ml    Labs/Imaging Results for orders placed or performed during the hospital encounter of 07/30/22 (from the past 48 hour(s))  Lactic acid, plasma     Status: None   Collection Time: 07/30/22  6:25 PM  Result Value Ref Range   Lactic Acid, Venous 1.3 0.5 - 1.9 mmol/L    Comment: Performed at [facility], [address] [place], [place] [address]  Comprehensive metabolic panel     Status: Abnormal   Collection Time: 07/30/22  6:25 PM  Result Value Ref Range   Sodium 138 135 - 145 mmol/L   Potassium 2.9 (L) 3.5 - 5.1 mmol/L   Chloride 101 98 - 111 mmol/L   CO2 26 22 - 32 mmol/L   Glucose, Bld 152 (H) 70 - 99 mg/dL    Comment: Glucose reference range applies only to samples taken after fasting for at least 8 hours.   BUN 32 (H) 8 - 23 mg/dL   Creatinine, Ser 2.05 (H) 0.61 - 1.24 mg/dL   Calcium 7.9 (L) 8.9 - 10.3 mg/dL   Total Protein 6.5 6.5 - 8.1 g/dL   Albumin 3.2 (L) 3.5 - 5.0 g/dL   AST 32 15 - 41 U/L   ALT 21 0 - 44 U/L   Alkaline Phosphatase 60 38 - 126 U/L   Total Bilirubin 2.4 (H) 0.3 - 1.2 mg/dL   GFR, Estimated 36 (L) >60 mL/min    Comment: (NOTE) Calculated using the CKD-EPI Creatinine Equation (2021)    Anion gap 11 5 - 15    Comment: Performed at [facility], [place] [address], [place], [place] [address]  CBC with Differential     Status: Abnormal   Collection Time: 07/30/22  6:25 PM  Result Value Ref Range   WBC 11.9 (H) 4.0 - 10.5 K/uL   RBC 4.53 4.22 - 5.81 MIL/uL   Hemoglobin 12.8 (L) 13.0 - 17.0 g/dL   HCT 40.2 39.0 - 52.0 %   MCV 88.7 80.0 - 100.0 fL   MCH 28.3 26.0 - 34.0 pg   MCHC 31.8 30.0 - 36.0 g/dL   RDW 16.2 (H) 11.5 - 15.5 %    Platelets 68 (L) 150 - 400 K/uL    Comment: SPECIMEN CHECKED FOR CLOTS Immature Platelet Fraction may be clinically indicated, consider ordering this additional test [id number] REPEATED TO VERIFY    nRBC 0.0 0.0 - 0.2 %   Neutrophils Relative % 81 %   Neutro Abs 9.5 (H) 1.7 - 7.7 K/uL   Lymphocytes Relative 10 %  Lymphs Abs 1.2 0.7 - 4.0 K/uL   Monocytes Relative 9 %   Monocytes Absolute 1.1 (H) 0.1 - 1.0 K/uL   Eosinophils Relative 0 %   Eosinophils Absolute 0.0 0.0 - 0.5 K/uL   Basophils Relative 0 %   Basophils Absolute 0.0 0.0 - 0.1 K/uL   Immature Granulocytes 0 %   Abs Immature Granulocytes 0.04 0.00 - 0.07 K/uL    Comment: Performed at [facility] [address], [place], [place] [address]  Resp Panel by RT-PCR (Flu A&B, Covid) Anterior Nasal Swab     Status: None   Collection Time: 07/30/22  6:31 PM   Specimen: Anterior Nasal Swab  Result Value Ref Range   SARS Coronavirus 2 by RT PCR NEGATIVE NEGATIVE    Comment: (NOTE) SARS-CoV-2 target nucleic acids are NOT DETECTED.  The SARS-CoV-2 RNA is generally detectable in upper respiratory specimens during the acute phase of infection. The lowest concentration of SARS-CoV-2 viral copies this assay can detect is 138 copies/mL. A negative result does not preclude SARS-Cov-2 infection and should not be used as the sole basis for treatment or other patient management decisions. A negative result may occur with  improper specimen collection/handling, submission of specimen other than nasopharyngeal swab, presence of viral mutation(s) within the areas targeted by this assay, and inadequate number of viral copies(<138 copies/mL). A negative result must be combined with clinical observations, patient history, and epidemiological information. The expected result is Negative.  Fact Sheet for Patients:  EntrepreneurPulse.com.au  Fact Sheet for Healthcare Providers:   IncredibleEmployment.be  This test is no t yet approved or cleared by the [place] FDA and  has been authorized for detection and/or diagnosis of SARS-CoV-2 by FDA under an Emergency Use Authorization (EUA). This EUA will remain  in effect (meaning this test can be used) for the duration of the COVID-19 declaration under Section 564(b)(1) of the Act, 21 U.S.C.section 360bbb-3(b)(1), unless the authorization is terminated  or revoked sooner.       Influenza A by PCR NEGATIVE NEGATIVE   Influenza B by PCR NEGATIVE NEGATIVE    Comment: (NOTE) The Xpert Xpress SARS-CoV-2/FLU/RSV plus assay is intended as an aid in the diagnosis of influenza from Nasopharyngeal swab specimens and should not be used as a sole basis for treatment. Nasal washings and aspirates are unacceptable for Xpert Xpress SARS-CoV-2/FLU/RSV testing.  Fact Sheet for Patients: EntrepreneurPulse.com.au  Fact Sheet for Healthcare Providers: IncredibleEmployment.be  This test is not yet approved or cleared by the [place] FDA and has been authorized for detection and/or diagnosis of SARS-CoV-2 by FDA under an Emergency Use Authorization (EUA). This EUA will remain in effect (meaning this test can be used) for the duration of the COVID-19 declaration under Section 564(b)(1) of the Act, 21 U.S.C. section 360bbb-3(b)(1), unless the authorization is terminated or revoked.  Performed at [facility], [place] [address] [place], [place] [address]   I-Stat venous blood gas, [facility] ED only)     Status: Abnormal   Collection Time: 07/30/22  7:16 PM  Result Value Ref Range   pH, Ven 7.391 7.25 - 7.43   pCO2, Ven 46.4 44 - 60 mmHg   pO2, Ven 92 (H) 32 - 45 mmHg   Bicarbonate 28.1 (H) 20.0 - 28.0 mmol/L   TCO2 30 22 - 32 mmol/L   O2 Saturation 97 %   Acid-Base Excess 3.0 (H) 0.0 - 2.0 mmol/L   Sodium 140 135 - 145 mmol/L  Potassium 2.9 (L) 3.5 - 5.1 mmol/L    Calcium, Ion 0.97 (L) 1.15 - 1.40 mmol/L   HCT 40.0 39.0 - 52.0 %   Hemoglobin 13.6 13.0 - 17.0 g/dL   Sample type VENOUS   Urinalysis, Routine w reflex microscopic Urine, Clean Catch     Status: Abnormal   Collection Time: 07/31/22 12:13 AM  Result Value Ref Range   Color, Urine YELLOW YELLOW   APPearance CLEAR CLEAR   Specific Gravity, Urine 1.021 1.005 - 1.030   pH 5.0 5.0 - 8.0   Glucose, UA >=500 (A) NEGATIVE mg/dL   Hgb urine dipstick NEGATIVE NEGATIVE   Bilirubin Urine NEGATIVE NEGATIVE   Ketones, ur NEGATIVE NEGATIVE mg/dL   Protein, ur 100 (A) NEGATIVE mg/dL   Nitrite NEGATIVE NEGATIVE   Leukocytes,Ua NEGATIVE NEGATIVE   RBC / HPF 0-5 0 - 5 RBC/hpf   WBC, UA 0-5 0 - 5 WBC/hpf   Bacteria, UA RARE (A) NONE SEEN   Mucus PRESENT    Hyaline Casts, UA PRESENT     Comment: Performed at [facility], [address] [place], [place] [address]  Protime-INR     Status: Abnormal   Collection Time: 07/31/22  5:48 AM  Result Value Ref Range   Prothrombin Time 15.8 (H) 11.4 - 15.2 seconds   INR 1.3 (H) 0.8 - 1.2    Comment: (NOTE) INR goal varies based on device and disease states. Performed at [facility], [place] [address], [place], [place] [address]   Cortisol-am, blood     Status: None   Collection Time: 07/31/22  5:48 AM  Result Value Ref Range   Cortisol - AM 17.8 6.7 - 22.6 ug/dL    Comment: Performed at [facility], [place] [address] [place], [place] [address]  Procalcitonin     Status: None   Collection Time: 07/31/22  5:48 AM  Result Value Ref Range   Procalcitonin 0.74 ng/mL    Comment:        Interpretation: PCT > 0.5 ng/mL and <= 2 ng/mL: Systemic infection (sepsis) is possible, but other conditions are known to elevate PCT as well. (NOTE)       Sepsis PCT Algorithm           Lower Respiratory Tract                                      Infection PCT Algorithm    ----------------------------     ----------------------------         PCT <  0.25 ng/mL                PCT < 0.10 ng/mL          Strongly encourage             Strongly discourage   discontinuation of antibiotics    initiation of antibiotics    ----------------------------     -----------------------------       PCT 0.25 - 0.50 ng/mL            PCT 0.10 - 0.25 ng/mL               OR       >80% decrease in PCT            Discourage initiation of  antibiotics      Encourage discontinuation           of antibiotics    ----------------------------     -----------------------------         PCT >= 0.50 ng/mL              PCT 0.26 - 0.50 ng/mL                AND       <80% decrease in PCT             Encourage initiation of                                             antibiotics       Encourage continuation           of antibiotics    ----------------------------     -----------------------------        PCT >= 0.50 ng/mL                  PCT > 0.50 ng/mL               AND         increase in PCT                  Strongly encourage                                      initiation of antibiotics    Strongly encourage escalation           of antibiotics                                     -----------------------------                                           PCT <= 0.25 ng/mL                                                 OR                                        > 80% decrease in PCT                                      Discontinue / Do not initiate                                             antibiotics  Performed at [facility], [place] [address], [place], [place] [address]   Basic metabolic panel     Status: Abnormal   Collection  Time: 07/31/22  5:48 AM  Result Value Ref Range   Sodium 143 135 - 145 mmol/L   Potassium 3.2 (L) 3.5 - 5.1 mmol/L   Chloride 108 98 - 111 mmol/L   CO2 24 22 - 32 mmol/L   Glucose, Bld 140 (H) 70 - 99 mg/dL    Comment: Glucose reference range applies only to samples taken after  fasting for at least 8 hours.   BUN 31 (H) 8 - 23 mg/dL   Creatinine, Ser 1.85 (H) 0.61 - 1.24 mg/dL   Calcium 7.4 (L) 8.9 - 10.3 mg/dL   GFR, Estimated 41 (L) >60 mL/min    Comment: (NOTE) Calculated using the CKD-EPI Creatinine Equation (2021)    Anion gap 11 5 - 15    Comment: Performed at [facility] [address] [place], [place] [address]  CBC     Status: Abnormal   Collection Time: 07/31/22  5:48 AM  Result Value Ref Range   WBC 9.5 4.0 - 10.5 K/uL   RBC 4.21 (L) 4.22 - 5.81 MIL/uL   Hemoglobin 12.1 (L) 13.0 - 17.0 g/dL   HCT 37.3 (L) 39.0 - 52.0 %   MCV 88.6 80.0 - 100.0 fL   MCH 28.7 26.0 - 34.0 pg   MCHC 32.4 30.0 - 36.0 g/dL   RDW 16.3 (H) 11.5 - 15.5 %   Platelets 61 (L) 150 - 400 K/uL    Comment: Immature Platelet Fraction may be clinically indicated, consider ordering this additional test [id number] REPEATED TO VERIFY    nRBC 0.0 0.0 - 0.2 %    Comment: Performed at [facility], [place] [address], [place], [place] [address]  CBG monitoring, ED     Status: Abnormal   Collection Time: 07/31/22  8:50 AM  Result Value Ref Range   Glucose-Capillary 210 (H) 70 - 99 mg/dL    Comment: Glucose reference range applies only to samples taken after fasting for at least 8 hours.  C-reactive protein     Status: Abnormal   Collection Time: 07/31/22 10:18 AM  Result Value Ref Range   CRP 29.7 (H) <1.0 mg/dL    Comment: Performed at [facility] [address] [place],  [address]  Procalcitonin     Status: None   Collection Time: 07/31/22 10:18 AM  Result Value Ref Range   Procalcitonin 0.74 ng/mL    Comment:        Interpretation: PCT > 0.5 ng/mL and <= 2 ng/mL: Systemic infection (sepsis) is possible, but other conditions are known to elevate PCT as well. (NOTE)       Sepsis PCT Algorithm           Lower Respiratory Tract                                      Infection PCT Algorithm    ----------------------------      ----------------------------         PCT < 0.25 ng/mL                PCT < 0.10 ng/mL          Strongly encourage             Strongly discourage   discontinuation of antibiotics    initiation of antibiotics    ----------------------------     -----------------------------  PCT 0.25 - 0.50 ng/mL            PCT 0.10 - 0.25 ng/mL               OR       >80% decrease in PCT            Discourage initiation of                                            antibiotics      Encourage discontinuation           of antibiotics    ----------------------------     -----------------------------         PCT >= 0.50 ng/mL              PCT 0.26 - 0.50 ng/mL                AND       <80% decrease in PCT             Encourage initiation of                                             antibiotics       Encourage continuation           of antibiotics    ----------------------------     -----------------------------        PCT >= 0.50 ng/mL                  PCT > 0.50 ng/mL               AND         increase in PCT                  Strongly encourage                                      initiation of antibiotics    Strongly encourage escalation           of antibiotics                                     -----------------------------                                           PCT <= 0.25 ng/mL                                                 OR                                        > 80% decrease in PCT  Discontinue / Do not initiate                                             antibiotics  Performed at [facility], [place] [address] [place], [place] [address]   CBC with Differential/Platelet     Status: Abnormal   Collection Time: 07/31/22 10:18 AM  Result Value Ref Range   WBC 9.6 4.0 - 10.5 K/uL   RBC 4.41 4.22 - 5.81 MIL/uL   Hemoglobin 12.7 (L) 13.0 - 17.0 g/dL   HCT 39.9 39.0 - 52.0 %   MCV 90.5 80.0 - 100.0 fL   MCH 28.8 26.0 - 34.0 pg   MCHC  31.8 30.0 - 36.0 g/dL   RDW 16.4 (H) 11.5 - 15.5 %   Platelets 66 (L) 150 - 400 K/uL    Comment: Immature Platelet Fraction may be clinically indicated, consider ordering this additional test [id number] REPEATED TO VERIFY    nRBC 0.0 0.0 - 0.2 %   Neutrophils Relative % 83 %   Neutro Abs 7.9 (H) 1.7 - 7.7 K/uL   Lymphocytes Relative 8 %   Lymphs Abs 0.8 0.7 - 4.0 K/uL   Monocytes Relative 9 %   Monocytes Absolute 0.9 0.1 - 1.0 K/uL   Eosinophils Relative 0 %   Eosinophils Absolute 0.0 0.0 - 0.5 K/uL   Basophils Relative 0 %   Basophils Absolute 0.0 0.0 - 0.1 K/uL   Immature Granulocytes 0 %   Abs Immature Granulocytes 0.04 0.00 - 0.07 K/uL    Comment: Performed at [facility], [place] [address], [place], [place] [address]  CBG monitoring, ED     Status: Abnormal   Collection Time: 07/31/22 11:36 AM  Result Value Ref Range   Glucose-Capillary 212 (H) 70 - 99 mg/dL    Comment: Glucose reference range applies only to samples taken after fasting for at least 8 hours.   CT Angio Chest PE W and/or Wo Contrast  Result Date: 07/30/2022 CLINICAL DATA:  Pulmonary embolism suspected, high probability. Shortness of breath and cough for 6 weeks. EXAM: CT ANGIOGRAPHY CHEST WITH CONTRAST TECHNIQUE: Multidetector CT imaging of the chest was performed using the standard protocol during bolus administration of intravenous contrast. Multiplanar CT image reconstructions and MIPs were obtained to evaluate the vascular anatomy. RADIATION DOSE REDUCTION: This exam was performed according to the departmental dose-optimization program which includes automated exposure control, adjustment of the mA and/or kV according to patient size and/or use of iterative reconstruction technique. CONTRAST:  54m OMNIPAQUE IOHEXOL 350 MG/ML SOLN COMPARISON:  01/01/2022. FINDINGS: Cardiovascular: The heart is enlarged and there is no pericardial effusion. Multi-vessel coronary artery calcifications are noted. There is  atherosclerotic calcification of the aorta without evidence of aneurysm. The pulmonary trunk is normal in caliber. No pulmonary artery filling defect is identified. Mediastinum/Nodes: Mediastinal and hilar lymphadenopathy is noted bilaterally. No axillary lymphadenopathy. The thyroid gland, trachea, and esophagus are within normal limits. Lungs/Pleura: There is a small right pleural effusion. No pneumothorax. Diffuse patchy airspace disease and interlobular septal thickening are noted bilaterally, greater on the right than on the left. Upper Abdomen: No acute abnormality. Musculoskeletal: Sternotomy wires are noted. Degenerative changes are present in the thoracic spine. No acute osseous abnormality. Review of the MIP images confirms the above findings. IMPRESSION: 1. No evidence of pulmonary embolism. 2. Diffuse patchy  airspace disease and interlobular septal thickening in the lungs bilaterally, possible edema versus multifocal pneumonia. 3. Small right pleural effusion. 4. Cardiomegaly with multi-vessel coronary artery calcifications. 5. Aortic atherosclerosis. Electronically Signed   By: Brett Fairy M.D.   On: 07/30/2022 20:59   DG Chest 1 View  Result Date: 07/30/2022 CLINICAL DATA:  Cough EXAM: CHEST  1 VIEW COMPARISON:  10/09/2019 FINDINGS: Postop CABG.  Cardiac enlargement. Right upper lobe airspace disease is new and appears acute. This could be infection or edema. Mild vascular congestion. No pleural effusion. IMPRESSION: Right upper lobe infiltrate may represent edema or pneumonia. Cardiac enlargement with vascular congestion. Electronically Signed   By: Franchot Gallo M.D.   On: 07/30/2022 18:17    Pending Labs Unresulted Labs (From admission, onward)     Start     Ordered   08/01/22 0500  Comprehensive metabolic panel  Tomorrow morning,   R        07/31/22 1046   07/31/22 1045  Expectorated Sputum Assessment w Gram Stain, Rflx to [place],   URGENT        07/31/22 1044    07/31/22 1020  Respiratory (~20 pathogens) panel by PCR  (Respiratory panel by PCR (~20 pathogens, ~24 hr TAT)  w precautions)  Once,   R        07/31/22 1019   07/31/22 1019  Brain natriuretic peptide  ONCE - URGENT,   URGENT        07/31/22 1019   07/31/22 1018  Sedimentation rate  Once,   R        07/31/22 1019   07/30/22 2152  HIV Antibody (routine testing w rflx)  (HIV Antibody (Routine testing w reflex) panel)  Once,   R        07/30/22 2157   07/30/22 1729  Blood culture (routine x 2)  BLOOD CULTURE X 2,   R      07/30/22 1730            Vitals/Pain Today's Vitals   07/31/22 1200 07/31/22 1215 07/31/22 1230 07/31/22 1245  BP: 118/78 115/70 124/71   Pulse: 81 78 80 77  Resp: (!) 28 18 (!) 21 20  Temp:      TempSrc:      SpO2: 90% 93% (!) 89% 92%  PainSc:        Isolation Precautions Droplet precaution  Medications Medications  nicotine (NICODERM CQ - dosed in mg/24 hours) patch 21 mg (21 mg Transdermal Patch Removed 07/31/22 1028)  aspirin chewable tablet 81 mg (81 mg Oral Given 07/31/22 1023)  atorvastatin (LIPITOR) tablet 80 mg (has no administration in time range)  ezetimibe (ZETIA) tablet 10 mg (10 mg Oral Given 07/31/22 1015)  furosemide (LASIX) tablet 20 mg (has no administration in time range)  metoprolol succinate (TOPROL-XL) 24 hr tablet 100 mg (100 mg Oral Given 07/31/22 1022)  nitroGLYCERIN (NITROSTAT) SL tablet 0.4 mg (has no administration in time range)  sacubitril-valsartan (ENTRESTO) 24-26 mg per tablet (0.5 tablets Oral Given 07/31/22 1014)  buPROPion (WELLBUTRIN XL) 24 hr tablet 300 mg (300 mg Oral Given 07/31/22 1016)  citalopram (CELEXA) tablet 20 mg (20 mg Oral Given 07/31/22 1023)  traZODone (DESYREL) tablet 100 mg (100 mg Oral Given 07/31/22 0009)  insulin aspart protamine- aspart (NOVOLOG MIX 70/30) injection 50 Units (50 Units Subcutaneous Given 07/31/22 1143)  empagliflozin (JARDIANCE) tablet 25 mg (25 mg Oral Given 07/31/22 1016)   metoCLOPramide (REGLAN) tablet 5  mg (has no administration in time range)  gabapentin (NEURONTIN) capsule 600 mg (600 mg Oral Given 07/31/22 0009)  cefTRIAXone (ROCEPHIN) 2 g in sodium chloride 0.9 % 100 mL IVPB (has no administration in time range)  azithromycin (ZITHROMAX) 500 mg in sodium chloride 0.9 % 250 mL IVPB (has no administration in time range)  acetaminophen (TYLENOL) tablet 650 mg (650 mg Oral Given 07/31/22 0859)    Or  acetaminophen (TYLENOL) suppository 650 mg ( Rectal See Alternative 07/31/22 0859)  traZODone (DESYREL) tablet 25 mg (has no administration in time range)  ondansetron (ZOFRAN) tablet 4 mg (has no administration in time range)    Or  ondansetron (ZOFRAN) injection 4 mg (has no administration in time range)  magnesium hydroxide (MILK OF MAGNESIA) suspension 30 mL (has no administration in time range)  guaiFENesin (MUCINEX) 12 hr tablet 600 mg (600 mg Oral Given 07/31/22 1023)  chlorpheniramine-HYDROcodone (TUSSIONEX) 10-8 MG/5ML suspension 5 mL (5 mLs Oral Given 07/31/22 1055)  ipratropium-albuterol (DUONEB) 0.5-2.5 (3) MG/3ML nebulizer solution 3 mL (3 mLs Nebulization Given 07/31/22 0900)  insulin aspart protamine- aspart (NOVOLOG MIX 70/30) injection 60 Units (has no administration in time range)  linagliptin (TRADJENTA) tablet 5 mg (5 mg Oral Given 07/31/22 1015)  sodium chloride 0.9 % bolus 1,000 mL (0 mLs Intravenous Stopped 07/30/22 1934)  cefTRIAXone (ROCEPHIN) 1 g in sodium chloride 0.9 % 100 mL IVPB (0 g Intravenous Stopped 07/30/22 2241)  azithromycin (ZITHROMAX) 500 mg in sodium chloride 0.9 % 250 mL IVPB (0 mg Intravenous Stopped 07/31/22 0141)  acetaminophen (TYLENOL) tablet 1,000 mg (1,000 mg Oral Given 07/30/22 1816)  iohexol (OMNIPAQUE) 350 MG/ML injection 55 mL (55 mLs Intravenous Contrast Given 07/30/22 2028)  potassium chloride SA (KLOR-CON M) CR tablet 40 mEq (40 mEq Oral Given 07/31/22 0007)  potassium chloride SA (KLOR-CON M) CR tablet 40  mEq (40 mEq Oral Given 07/31/22 0658)    Mobility walks Low fall risk   Focused Assessments Cardiac Assessment Handoff:    Lab Results  Component Value Date   TROPONINI 3.20 ([place]) 06/14/2016   Lab Results  Component Value Date   DDIMER 1.87 ([facility]) 06/11/2016   Does the Patient currently have chest pain? No   , Pulmonary Assessment Handoff:  Lung sounds: Bilateral Breath Sounds: Clear O2 Device: (S) Nasal Cannula O2 Flow Rate (L/min): (S) 3 L/min    R Recommendations: See Admitting Provider Note  Report given to:   Additional Notes:

## 2022-07-31 NOTE — ED Notes (Signed)
Report given and care endorsed to [facility]

## 2022-07-31 NOTE — Evaluation (Signed)
Clinical/Bedside Swallow Evaluation Patient Details  Name: [name] MRN: [id number] Date of Birth: 27-Oct-1960  Today's Date: 07/31/2022 Time: SLP Start Time (ACUTE ONLY): 4462 SLP Stop Time (ACUTE ONLY): 1147 SLP Time Calculation (min) (ACUTE ONLY): 5 min  Past Medical History:  Past Medical History:  Diagnosis Date   Abrasion L FOREARM   Atrial fibrillation ([place])    a. initially occurring in post-op setting in 05/2016, started on Eliquis b. 08/2016: s/p clipping of atrial appendage at time of CABG --> Eliquis switched to Coumadin   CAD (coronary artery disease)    a. 05/2016: NSTEMI, cath showing 3v disease with CABG recommended once MRSA infection resolved. b. 08/2016: CABG with LIMA-LAD, SVG-D1, and SVG-RI.   Cancer ([place])    basal cell carcinoma of the skin   Diabetes mellitus without complication ([place])    MMNOTRRN(165.7)    Hematuria, microscopic "SINCE I WAS A KID"   Hyperlipidemia    Hypertension    Ischemic cardiomyopathy    a. 05/2016: echo w/ EF of 25-35% and akinesis of the basal-midinferolateral and inferior myocardium.   Lactose intolerance    Myocardial infarction ([place])    Respiratory failure ([facility])    Rotator cuff tear RIGHT   Tobacco use    a. quit in 05/2016 following MI   Past Surgical History:  Past Surgical History:  Procedure Laterality Date   AMPUTATION TOE Left 02/06/2020   5th ray amputation with graph [facility])   CARDIAC CATHETERIZATION N/A 06/16/2016   Procedure: Left Heart Cath and Coronary Angiography;  Surgeon: Troy Sine, MD;  Location: [facility];  Service: Cardiovascular;  Laterality: N/A;   CLIPPING OF ATRIAL APPENDAGE N/A 08/24/2016   Procedure: CLIPPING OF ATRIAL APPENDAGE;  Surgeon: Ivin Poot, MD;  Location: [facility];  Service: Open Heart Surgery;  Laterality: N/A;   COLONOSCOPY WITH PROPOFOL N/A 02/12/2022   Procedure: COLONOSCOPY WITH PROPOFOL;  Surgeon: Lucilla Lame, MD;  Location: [facility];  Service: Endoscopy;   Laterality: N/A;   CORONARY ARTERY BYPASS GRAFT N/A 08/24/2016   Procedure: CORONARY ARTERY BYPASS GRAFTING (CABG) x three,  using left internal mammary artery and right leg greater saphenous vein harvested endoscopically;  Surgeon: Ivin Poot, MD;  Location: [facility];  Service: Open Heart Surgery;  Laterality: N/A;   HERNIA REPAIR  X2   INCISION AND DRAINAGE ABSCESS Left 06/10/2016   Procedure: INCISION AND DRAINAGE LEFT AXILLARY ABSCESS;  Surgeon: Autumn Messing III, MD;  Location: [facility];  Service: General;  Laterality: Left;   ROTATOR CUFF REPAIR Bilateral    TEE WITHOUT CARDIOVERSION N/A 08/24/2016   Procedure: TRANSESOPHAGEAL ECHOCARDIOGRAM (TEE);  Surgeon: Ivin Poot, MD;  Location: [place];  Service: Open Heart Surgery;  Laterality: N/A;   HPI:  61 y.o.  male with history of HFrEF, PAF-s/p ablation, CAD s/p CABG, DM-2, HTN, HLD who presented with worsening cough, weakness, shortness of breath-he was found to have PNA. Per chart has had cough/cold like symptoms for 4-6 weeks and given Doxycycline without improvement.    Assessment / Plan / Recommendation  Clinical Impression  Pt enountered alert and cooperative for bedside swallow assessment in setting of pna and denies prior or current dysphagia.  He consumed 3 oz consecutively without hesitation or s/sx aspiration. Mastication and tranist of regular solid texture consumed in timely manner without oral residue. Recommend he continue regular texture, thin liquids and pills with RN. No further ST is needed. SLP Visit Diagnosis: Dysphagia, unspecified (R13.10)  Aspiration Risk  No limitations    Diet Recommendation Regular;Thin liquid   Liquid Administration via: Cup;Straw Medication Administration: Whole meds with liquid Supervision: Patient able to self feed Postural Changes: Seated upright at 90 degrees    Other  Recommendations Oral Care Recommendations: Oral care BID    Recommendations for follow up therapy are one component of  a multi-disciplinary discharge planning process, led by the attending physician.  Recommendations may be updated based on patient status, additional functional criteria and insurance authorization.  Follow up Recommendations No SLP follow up      Assistance Recommended at Discharge None  Functional Status Assessment    Frequency and Duration            Prognosis        Swallow Study   General Date of Onset: 07/30/22 HPI: 61 y.o.  male with history of HFrEF, PAF-s/p ablation, CAD s/p CABG, DM-2, HTN, HLD who presented with worsening cough, weakness, shortness of breath-he was found to have PNA. Per chart has had cough/cold like symptoms for 4-6 weeks and given Doxycycline without improvement. Type of Study: Bedside Swallow Evaluation Previous Swallow Assessment:  (none) Diet Prior to this Study: Regular;Thin liquids Temperature Spikes Noted: No Respiratory Status: Nasal cannula History of Recent Intubation: No Behavior/Cognition: Alert;Cooperative;Pleasant mood Oral Cavity Assessment: Within Functional Limits Oral Care Completed by SLP: No Oral Cavity - Dentition: Adequate natural dentition Vision: Functional for self-feeding Self-Feeding Abilities: Able to feed self Patient Positioning: Upright in bed Baseline Vocal Quality: Normal Volitional Cough: Strong Volitional Swallow: Able to elicit    Oral/Motor/Sensory Function Overall Oral Motor/Sensory Function: Within functional limits   Ice Chips Ice chips: Not tested   Thin Liquid Thin Liquid: Within functional limits Presentation: Straw    Nectar Thick Nectar Thick Liquid: Not tested   Honey Thick Honey Thick Liquid: Not tested   Puree Puree: Not tested   Solid     Solid: Within functional limits      Houston Siren 07/31/2022,2:26 PM

## 2022-07-31 NOTE — Progress Notes (Signed)
PROGRESS NOTE        PATIENT DETAILS Name: [name] Age: 61 y.o. Sex: male Date of Birth: [birth year] Admit Date: 07/30/2022 Admitting Physician Christel Mormon, MD MOQ:HUTML, Alyson Locket, NP  Brief Summary: Patient is a 61 y.o.  male with history of HFrEF, PAF-s/p ablation-not on AC, CAD s/p CABG, DM-2, HTN, HLD who presented with worsening cough, weakness, shortness of breath-he was found to have PNA and subsequently admitted to the hospitalist service.  Per patient-he has had cough/cold like symptoms for approximately 4 to 6 weeks (claims he always gets URI/coughs as he picks up a virus from his small grandchildren) -he was seen by his PCP and initially given doxycycline without any improvement, he was then started on Augmentin which he apparently took for 5/7 days without any improvement.    Significant events: 10/12>> admit to [facility]-hypoxia/sepsis-PNA  Significant studies: 10/12>> CXR: Right upper lobe PNA 10/12>> CTA chest: Multifocal infiltrates-right> left.  No PE.  Significant microbiology data: 10/12>> COVID/influenza PCR: Negative 10/12>> blood culture: Pending  Procedures: None  Consults: PCCM  Subjective: Feels better-but still requiring 2-3 L of oxygen.  Coughing  Objective: Vitals: Blood pressure 113/69, pulse 69, temperature 97.7 F (36.5 C), temperature source Oral, resp. rate (!) 22, SpO2 96 %.   Exam: Gen Exam:Alert awake-not in any distress.  Frail/chronically sick appearing. HEENT:atraumatic, normocephalic Chest: Bibasilar infiltrates CVS:S1S2 regular Abdomen:soft non tender, non distended Extremities:no edema Neurology: Non focal Skin: no rash  Pertinent Labs/Radiology:    Latest Ref Rng & Units 07/31/2022    5:48 AM 07/30/2022    7:16 PM 07/30/2022    6:25 PM  CBC  WBC 4.0 - 10.5 K/uL 9.5   11.9   Hemoglobin 13.0 - 17.0 g/dL 12.1  13.6  12.8   Hematocrit 39.0 - 52.0 % 37.3  40.0  40.2   Platelets 150 - 400 K/uL 61    68     Lab Results  Component Value Date   NA 143 07/31/2022   K 3.2 (L) 07/31/2022   CL 108 07/31/2022   CO2 24 07/31/2022      Assessment/Plan: Severe sepsis Acute hypoxic respiratory failure Multifocal infiltrates right> left-?  Atypical organisms causing PNA, cryptogenic organizing pneumonia versus other etiologies Although has history of HFrEF-does not appear volume overloaded-checking BNP. Stable-on 2-3 L of oxygen Continue Rocephin/Zithromax however no response to Augmentin/doxycycline as an outpatient Send sputum culture, respiratory virus panel, inflammatory markers Will discuss with PCCM-if no improvement-May need to consider bronchoscopy  AKI Suspect hemodynamically mediated Improving with supportive care Avoid nephrotoxic agents  Thrombocytopenia Possibly from sepsis Watch for now If worsens-we can commence further work-up  Hypokalemia Replete and recheck  Chronic HFrEF Does not appear volume overloaded Continue Entresto/beta-blocker/Farxiga Hold diuretics until AKI improves further Hold further IVF  CAD s/p CABG No anginal symptoms Continue aspirin/statin/beta-blocker  History of PAF-s/p ablation Maintaining sinus rhythm Follows with [facility]-cardiology-not on anticoagulation-on aspirin. Monitor in telemetry  HLD Continue statin/Zetia  DM-2 (A1c 7.0 on 9/25) Continue insulin 70/30 50 units in a.m. and 60 units in p.m.-monitor for 24 hours and adjust accordingly.  Recent Labs    07/31/22 0850  GLUCAP 210*    Tobacco abuse Continue transdermal nicotine Counseled  BMI: Estimated body mass index is 28.54 kg/m as calculated from the following:   Height as of 07/15/22: '5\' 5"'$  (  1.651 m).   Weight as of 07/15/22: 77.8 kg.   Code status:   Code Status: Full Code   DVT Prophylaxis: Place and maintain sequential compression device Start: 07/30/22 2203   Family Communication: None at bedside   Disposition Plan: Status is: Inpatient Remains  inpatient appropriate because: Significant lung infiltrates-hypoxic-not stable for discharge.   Planned Discharge Destination:Home   Diet: Diet Order             Diet Heart Room service appropriate? Yes; Fluid consistency: Thin  Diet effective now                     Antimicrobial agents: Anti-infectives (From admission, onward)    Start     Dose/Rate Route Frequency Ordered Stop   07/31/22 2200  cefTRIAXone (ROCEPHIN) 2 g in sodium chloride 0.9 % 100 mL IVPB        2 g 200 mL/hr over 30 Minutes Intravenous Every 24 hours 07/30/22 2157 08/05/22 2159   07/31/22 2200  azithromycin (ZITHROMAX) 500 mg in sodium chloride 0.9 % 250 mL IVPB        500 mg 250 mL/hr over 60 Minutes Intravenous Every 24 hours 07/30/22 2157 08/05/22 2159   07/30/22 1800  cefTRIAXone (ROCEPHIN) 1 g in sodium chloride 0.9 % 100 mL IVPB        1 g 200 mL/hr over 30 Minutes Intravenous  Once 07/30/22 1745 07/30/22 2241   07/30/22 1800  azithromycin (ZITHROMAX) 500 mg in sodium chloride 0.9 % 250 mL IVPB        500 mg 250 mL/hr over 60 Minutes Intravenous  Once 07/30/22 1745 07/31/22 0141        MEDICATIONS: Scheduled Meds:  aspirin  81 mg Oral Daily   atorvastatin  80 mg Oral QHS   buPROPion  300 mg Oral Daily   citalopram  20 mg Oral Daily   empagliflozin  25 mg Oral Daily   ezetimibe  10 mg Oral Daily   gabapentin  600 mg Oral QHS   guaiFENesin  600 mg Oral BID   insulin aspart protamine- aspart  50 Units Subcutaneous QAC breakfast   insulin aspart protamine- aspart  60 Units Subcutaneous QAC supper   ipratropium-albuterol  3 mL Nebulization QID   linagliptin  5 mg Oral Daily   metoprolol succinate  100 mg Oral Daily   nicotine  21 mg Transdermal Daily   sacubitril-valsartan  0.5 tablet Oral BID   traZODone  100 mg Oral QHS   Continuous Infusions:  sodium chloride 100 mL/hr at 07/31/22 0229   azithromycin     cefTRIAXone (ROCEPHIN)  IV     PRN Meds:.acetaminophen **OR**  acetaminophen, chlorpheniramine-HYDROcodone, furosemide, magnesium hydroxide, metoCLOPramide, nitroGLYCERIN, ondansetron **OR** ondansetron (ZOFRAN) IV, traZODone   I have personally reviewed following labs and imaging studies  LABORATORY DATA: CBC: Recent Labs  Lab 07/30/22 1825 07/30/22 1916 07/31/22 0548  WBC 11.9*  --  9.5  NEUTROABS 9.5*  --   --   HGB 12.8* 13.6 12.1*  HCT 40.2 40.0 37.3*  MCV 88.7  --  88.6  PLT 68*  --  61*    Basic Metabolic Panel: Recent Labs  Lab 07/30/22 1825 07/30/22 1916 07/31/22 0548  NA 138 140 143  K 2.9* 2.9* 3.2*  CL 101  --  108  CO2 26  --  24  GLUCOSE 152*  --  140*  BUN 32*  --  31*  CREATININE 2.05*  --  1.85*  CALCIUM 7.9*  --  7.4*    GFR: CrCl cannot be calculated (Unknown ideal weight.).  Liver Function Tests: Recent Labs  Lab 07/30/22 1825  AST 32  ALT 21  ALKPHOS 60  BILITOT 2.4*  PROT 6.5  ALBUMIN 3.2*   No results for input(s): "LIPASE", "AMYLASE" in the last 168 hours. No results for input(s): "AMMONIA" in the last 168 hours.  Coagulation Profile: Recent Labs  Lab 07/31/22 0548  INR 1.3*    Cardiac Enzymes: No results for input(s): "CKTOTAL", "CKMB", "CKMBINDEX", "TROPONINI" in the last 168 hours.  BNP (last 3 results) No results for input(s): "PROBNP" in the last 8760 hours.  Lipid Profile: No results for input(s): "CHOL", "HDL", "LDLCALC", "TRIG", "CHOLHDL", "LDLDIRECT" in the last 72 hours.  Thyroid Function Tests: No results for input(s): "TSH", "T4TOTAL", "FREET4", "T3FREE", "THYROIDAB" in the last 72 hours.  Anemia Panel: No results for input(s): "VITAMINB12", "FOLATE", "FERRITIN", "TIBC", "IRON", "RETICCTPCT" in the last 72 hours.  Urine analysis:    Component Value Date/Time   COLORURINE YELLOW 07/31/2022 0013   APPEARANCEUR CLEAR 07/31/2022 0013   LABSPEC 1.021 07/31/2022 0013   PHURINE 5.0 07/31/2022 0013   GLUCOSEU >=500 (A) 07/31/2022 0013   HGBUR NEGATIVE 07/31/2022 0013    BILIRUBINUR NEGATIVE 07/31/2022 0013   BILIRUBINUR negative 10/29/2021 1113   KETONESUR NEGATIVE 07/31/2022 0013   PROTEINUR 100 (A) 07/31/2022 0013   UROBILINOGEN 0.2 10/29/2021 1113   UROBILINOGEN 1.0 12/02/2011 1456   NITRITE NEGATIVE 07/31/2022 0013   LEUKOCYTESUR NEGATIVE 07/31/2022 0013    Sepsis Labs: Lactic Acid, Venous    Component Value Date/Time   LATICACIDVEN 1.3 07/30/2022 1825    MICROBIOLOGY: Recent Results (from the past 240 hour(s))  Resp Panel by RT-PCR (Flu A&B, Covid) Anterior Nasal Swab     Status: None   Collection Time: 07/30/22  6:31 PM   Specimen: Anterior Nasal Swab  Result Value Ref Range Status   SARS Coronavirus 2 by RT PCR NEGATIVE NEGATIVE Final    Comment: (NOTE) SARS-CoV-2 target nucleic acids are NOT DETECTED.  The SARS-CoV-2 RNA is generally detectable in upper respiratory specimens during the acute phase of infection. The lowest concentration of SARS-CoV-2 viral copies this assay can detect is 138 copies/mL. A negative result does not preclude SARS-Cov-2 infection and should not be used as the sole basis for treatment or other patient management decisions. A negative result may occur with  improper specimen collection/handling, submission of specimen other than nasopharyngeal swab, presence of viral mutation(s) within the areas targeted by this assay, and inadequate number of viral copies(<138 copies/mL). A negative result must be combined with clinical observations, patient history, and epidemiological information. The expected result is Negative.  Fact Sheet for Patients:  EntrepreneurPulse.com.au  Fact Sheet for Healthcare Providers:  IncredibleEmployment.be  This test is no t yet approved or cleared by the [place] FDA and  has been authorized for detection and/or diagnosis of SARS-CoV-2 by FDA under an Emergency Use Authorization (EUA). This EUA will remain  in effect (meaning this  test can be used) for the duration of the COVID-19 declaration under Section 564(b)(1) of the Act, 21 U.S.C.section 360bbb-3(b)(1), unless the authorization is terminated  or revoked sooner.       Influenza A by PCR NEGATIVE NEGATIVE Final   Influenza B by PCR NEGATIVE NEGATIVE Final    Comment: (NOTE) The Xpert Xpress SARS-CoV-2/FLU/RSV plus assay is intended as an aid in the diagnosis of influenza from Nasopharyngeal swab specimens  and should not be used as a sole basis for treatment. Nasal washings and aspirates are unacceptable for Xpert Xpress SARS-CoV-2/FLU/RSV testing.  Fact Sheet for Patients: EntrepreneurPulse.com.au  Fact Sheet for Healthcare Providers: IncredibleEmployment.be  This test is not yet approved or cleared by the [place] FDA and has been authorized for detection and/or diagnosis of SARS-CoV-2 by FDA under an Emergency Use Authorization (EUA). This EUA will remain in effect (meaning this test can be used) for the duration of the COVID-19 declaration under Section 564(b)(1) of the Act, 21 U.S.C. section 360bbb-3(b)(1), unless the authorization is terminated or revoked.  Performed at [facility], [place] [address] [place], [place] [address]     RADIOLOGY STUDIES/RESULTS: CT Angio Chest PE W and/or Wo Contrast  Result Date: 07/30/2022 CLINICAL DATA:  Pulmonary embolism suspected, high probability. Shortness of breath and cough for 6 weeks. EXAM: CT ANGIOGRAPHY CHEST WITH CONTRAST TECHNIQUE: Multidetector CT imaging of the chest was performed using the standard protocol during bolus administration of intravenous contrast. Multiplanar CT image reconstructions and MIPs were obtained to evaluate the vascular anatomy. RADIATION DOSE REDUCTION: This exam was performed according to the departmental dose-optimization program which includes automated exposure control, adjustment of the mA and/or kV according to patient size  and/or use of iterative reconstruction technique. CONTRAST:  20m OMNIPAQUE IOHEXOL 350 MG/ML SOLN COMPARISON:  01/01/2022. FINDINGS: Cardiovascular: The heart is enlarged and there is no pericardial effusion. Multi-vessel coronary artery calcifications are noted. There is atherosclerotic calcification of the aorta without evidence of aneurysm. The pulmonary trunk is normal in caliber. No pulmonary artery filling defect is identified. Mediastinum/Nodes: Mediastinal and hilar lymphadenopathy is noted bilaterally. No axillary lymphadenopathy. The thyroid gland, trachea, and esophagus are within normal limits. Lungs/Pleura: There is a small right pleural effusion. No pneumothorax. Diffuse patchy airspace disease and interlobular septal thickening are noted bilaterally, greater on the right than on the left. Upper Abdomen: No acute abnormality. Musculoskeletal: Sternotomy wires are noted. Degenerative changes are present in the thoracic spine. No acute osseous abnormality. Review of the MIP images confirms the above findings. IMPRESSION: 1. No evidence of pulmonary embolism. 2. Diffuse patchy airspace disease and interlobular septal thickening in the lungs bilaterally, possible edema versus multifocal pneumonia. 3. Small right pleural effusion. 4. Cardiomegaly with multi-vessel coronary artery calcifications. 5. Aortic atherosclerosis. Electronically Signed   By: LBrett FairyM.D.   On: 07/30/2022 20:59   DG Chest 1 View  Result Date: 07/30/2022 CLINICAL DATA:  Cough EXAM: CHEST  1 VIEW COMPARISON:  10/09/2019 FINDINGS: Postop CABG.  Cardiac enlargement. Right upper lobe airspace disease is new and appears acute. This could be infection or edema. Mild vascular congestion. No pleural effusion. IMPRESSION: Right upper lobe infiltrate may represent edema or pneumonia. Cardiac enlargement with vascular congestion. Electronically Signed   By: CFranchot GalloM.D.   On: 07/30/2022 18:17     LOS: 1 day   SOren Binet MD  [facility]    To contact the attending provider between 7A-7P or the covering provider during after hours 7P-7A, please log into the web site [email or web address] and access using universal [place] password for that web site. If you do not have the password, please call the hospital operator.  07/31/2022, 10:07 AM

## 2022-08-01 ENCOUNTER — Encounter (HOSPITAL_COMMUNITY): Payer: Self-pay | Admitting: Family Medicine

## 2022-08-01 ENCOUNTER — Encounter (HOSPITAL_COMMUNITY)
Admission: EM | Disposition: A | Discharge status: Home / Self Care | Payer: Self-pay | Source: Home / Self Care | Attending: Internal Medicine

## 2022-08-01 ENCOUNTER — Inpatient Hospital Stay (HOSPITAL_COMMUNITY): Payer: Medicare Other | Admitting: Certified Registered"

## 2022-08-01 DIAGNOSIS — F17210 Nicotine dependence, cigarettes, uncomplicated: Secondary | ICD-10-CM

## 2022-08-01 DIAGNOSIS — Z951 Presence of aortocoronary bypass graft: Secondary | ICD-10-CM

## 2022-08-01 DIAGNOSIS — I509 Heart failure, unspecified: Secondary | ICD-10-CM

## 2022-08-01 DIAGNOSIS — A419 Sepsis, unspecified organism: Secondary | ICD-10-CM | POA: Diagnosis not present

## 2022-08-01 DIAGNOSIS — I25119 Atherosclerotic heart disease of native coronary artery with unspecified angina pectoris: Secondary | ICD-10-CM | POA: Diagnosis not present

## 2022-08-01 DIAGNOSIS — J189 Pneumonia, unspecified organism: Secondary | ICD-10-CM | POA: Diagnosis not present

## 2022-08-01 DIAGNOSIS — I110 Hypertensive heart disease with heart failure: Secondary | ICD-10-CM

## 2022-08-01 DIAGNOSIS — I252 Old myocardial infarction: Secondary | ICD-10-CM | POA: Diagnosis not present

## 2022-08-01 DIAGNOSIS — J9601 Acute respiratory failure with hypoxia: Secondary | ICD-10-CM | POA: Diagnosis not present

## 2022-08-01 DIAGNOSIS — I10 Essential (primary) hypertension: Secondary | ICD-10-CM | POA: Diagnosis not present

## 2022-08-01 HISTORY — PX: VIDEO BRONCHOSCOPY: SHX5072

## 2022-08-01 HISTORY — PX: BRONCHIAL WASHINGS: SHX5105

## 2022-08-01 HISTORY — PX: BRONCHIAL BRUSHINGS: SHX5108

## 2022-08-01 LAB — GLUCOSE, CAPILLARY
Glucose-Capillary: 159 mg/dL — ABNORMAL HIGH (ref 70–99)
Glucose-Capillary: 113 mg/dL — ABNORMAL HIGH (ref 70–99)
Glucose-Capillary: 82 mg/dL (ref 70–99)
Glucose-Capillary: 102 mg/dL — ABNORMAL HIGH (ref 70–99)
Glucose-Capillary: 123 mg/dL — ABNORMAL HIGH (ref 70–99)
Glucose-Capillary: 260 mg/dL — ABNORMAL HIGH (ref 70–99)

## 2022-08-01 LAB — COMPREHENSIVE METABOLIC PANEL WITH GFR
ALT: 25 U/L (ref 0–44)
AST: 39 U/L (ref 15–41)
Albumin: 2.6 g/dL — ABNORMAL LOW (ref 3.5–5.0)
Alkaline Phosphatase: 58 U/L (ref 38–126)
Anion gap: 7 (ref 5–15)
BUN: 30 mg/dL — ABNORMAL HIGH (ref 8–23)
CO2: 23 mmol/L (ref 22–32)
Calcium: 7.7 mg/dL — ABNORMAL LOW (ref 8.9–10.3)
Chloride: 111 mmol/L (ref 98–111)
Creatinine, Ser: 1.7 mg/dL — ABNORMAL HIGH (ref 0.61–1.24)
GFR, Estimated: 45 mL/min — ABNORMAL LOW
Glucose, Bld: 52 mg/dL — ABNORMAL LOW (ref 70–99)
Potassium: 3.3 mmol/L — ABNORMAL LOW (ref 3.5–5.1)
Sodium: 141 mmol/L (ref 135–145)
Total Bilirubin: 0.7 mg/dL (ref 0.3–1.2)
Total Protein: 6 g/dL — ABNORMAL LOW (ref 6.5–8.1)

## 2022-08-01 LAB — SEDIMENTATION RATE: Sed Rate: 57 mm/h — ABNORMAL HIGH (ref 0–16)

## 2022-08-01 LAB — C-REACTIVE PROTEIN: CRP: 25.5 mg/dL — ABNORMAL HIGH

## 2022-08-01 SURGERY — BRONCHOSCOPY, WITH FLUOROSCOPY
Anesthesia: General | Laterality: Left

## 2022-08-01 SURGERY — BRONCHOSCOPY, WITH FLUOROSCOPY
Anesthesia: General | Laterality: Right

## 2022-08-01 MED ORDER — METHYLPREDNISOLONE SODIUM SUCC 125 MG IJ SOLR
40.0000 mg | INTRAMUSCULAR | Status: DC
  Administered 2022-08-01: 40 mg via INTRAVENOUS
  Filled 2022-08-01: qty 2

## 2022-08-01 MED ORDER — POTASSIUM CHLORIDE 10 MEQ/100ML IV SOLN
10.0000 meq | INTRAVENOUS | Status: AC
  Administered 2022-08-01 (×2): 10 meq via INTRAVENOUS
  Filled 2022-08-01: qty 100

## 2022-08-01 MED ORDER — DEXAMETHASONE SODIUM PHOSPHATE 10 MG/ML IJ SOLN
INTRAMUSCULAR | Status: DC | PRN
  Administered 2022-08-01: 10 mg via INTRAVENOUS

## 2022-08-01 MED ORDER — DOXYCYCLINE HYCLATE 100 MG PO TABS
100.0000 mg | ORAL_TABLET | Freq: Two times a day (BID) | ORAL | Status: DC
  Administered 2022-08-01 – 2022-08-03 (×4): 100 mg via ORAL
  Filled 2022-08-01 (×4): qty 1

## 2022-08-01 MED ORDER — SUCCINYLCHOLINE CHLORIDE 200 MG/10ML IV SOSY
PREFILLED_SYRINGE | INTRAVENOUS | Status: DC | PRN
  Administered 2022-08-01: 120 mg via INTRAVENOUS

## 2022-08-01 MED ORDER — FENTANYL CITRATE (PF) 250 MCG/5ML IJ SOLN
INTRAMUSCULAR | Status: DC | PRN
  Administered 2022-08-01: 50 ug via INTRAVENOUS

## 2022-08-01 MED ORDER — POTASSIUM CHLORIDE 10 MEQ/100ML IV SOLN
10.0000 meq | INTRAVENOUS | Status: AC
  Filled 2022-08-01: qty 100

## 2022-08-01 MED ORDER — LIDOCAINE 2% (20 MG/ML) 5 ML SYRINGE
INTRAMUSCULAR | Status: DC | PRN
  Administered 2022-08-01: 60 mg via INTRAVENOUS

## 2022-08-01 MED ORDER — PROPOFOL 10 MG/ML IV BOLUS
INTRAVENOUS | Status: DC | PRN
  Administered 2022-08-01: 130 mg via INTRAVENOUS

## 2022-08-01 MED ORDER — ONDANSETRON HCL 4 MG/2ML IJ SOLN
INTRAMUSCULAR | Status: DC | PRN
  Administered 2022-08-01: 4 mg via INTRAVENOUS

## 2022-08-01 MED ORDER — LACTATED RINGERS IV SOLN: INTRAVENOUS | Status: DC | PRN

## 2022-08-01 NOTE — Anesthesia Preprocedure Evaluation (Addendum)
Anesthesia Evaluation  Patient identified by MRN, date of birth, ID band Patient awake    Reviewed: Allergy & Precautions, NPO status , Patient's Chart, lab work & pertinent test results  Airway Mallampati: III  TM Distance: >3 FB Neck ROM: Full    Dental  (+) Poor Dentition   Pulmonary pneumonia, unresolved, Current Smoker and Patient abstained from smoking.,     + decreased breath sounds      Cardiovascular hypertension, Pt. on medications and Pt. on home beta blockers + angina + CAD, + Past MI, + CABG and +CHF  + dysrhythmias Atrial Fibrillation  Rhythm:Regular Rate:Normal   1. Left ventricular ejection fraction, by estimation, is 60 to 65%. The  left ventricle has normal function. The left ventricle has no regional  wall motion abnormalities. There is moderate left ventricular hypertrophy.  Left ventricular diastolic  parameters are indeterminate.  2. Right ventricular systolic function is normal. The right ventricular  size is normal. There is mildly elevated pulmonary artery systolic  pressure.  3. Left atrial size was mildly dilated.  4. Right atrial size was mildly dilated.  5. The mitral valve is normal in structure. No evidence of mitral valve  regurgitation. Moderate mitral annular calcification.  6. The aortic valve is tricuspid. There is mild calcification of the  aortic valve. Aortic valve regurgitation is not visualized.  7. The inferior vena cava is dilated in size with >50% respiratory  variability, suggesting right atrial pressure of 8 mmHg.    Neuro/Psych  Headaches, negative psych ROS   GI/Hepatic negative GI ROS, Neg liver ROS,   Endo/Other  diabetes, Type 2, Insulin Dependent  Renal/GU negative Renal ROS  negative genitourinary   Musculoskeletal negative musculoskeletal ROS (+)   Abdominal Normal abdominal exam  (+)   Peds  Hematology negative hematology ROS (+)   Anesthesia Other  Findings   Reproductive/Obstetrics                            Anesthesia Physical Anesthesia Plan  ASA: 3  Anesthesia Plan: General   Post-op Pain Management:    Induction: Intravenous  PONV Risk Score and Plan: 1 and Ondansetron, Dexamethasone, Midazolam and Treatment may vary due to age or medical condition  Airway Management Planned: Mask and Oral ETT  Additional Equipment: None  Intra-op Plan:   Post-operative Plan: Extubation in OR  Informed Consent: I have reviewed the patients History and Physical, chart, labs and discussed the procedure including the risks, benefits and alternatives for the proposed anesthesia with the patient or authorized representative who has indicated his/her understanding and acceptance.     Dental advisory given  Plan Discussed with: CRNA  Anesthesia Plan Comments:        Anesthesia Quick Evaluation

## 2022-08-01 NOTE — Progress Notes (Signed)
PROGRESS NOTE        PATIENT DETAILS Name: [name] Age: 61 y.o. Sex: male Date of Birth: [birth year] Admit Date: 07/30/2022 Admitting Physician Christel Mormon, MD GYI:RSWNI, Alyson Locket, NP  Brief Summary: Patient is a 61 y.o.  male with history of HFrEF, PAF-s/p ablation-not on AC, CAD s/p CABG, DM-2, HTN, HLD who presented with worsening cough, weakness, shortness of breath-he was found to have PNA and subsequently admitted to the hospitalist service.  Per patient-he has had cough/cold like symptoms for approximately 4 to 6 weeks (claims he always gets URI/coughs as he picks up a virus from his small grandchildren) -he was seen by his PCP and initially given doxycycline without any improvement, he was then started on Augmentin which he apparently took for 5/7 days without any improvement.    Significant events: 10/12>> admit to [facility]-hypoxia/sepsis-PNA  Significant studies: 10/12>> CXR: Right upper lobe PNA 10/12>> CTA chest: Multifocal infiltrates-right> left.  No PE. 10/13>> ANCA profile: Pending 10/13>> ANA: Pending 10/13>> RA factor: Pending 10/13>> CRP: 29.7  Significant microbiology data: 10/12>> HIV negative 10/12>> COVID/influenza PCR: Negative 10/12>> respiratory virus panel: Negative 10/12>> blood culture: No growth 10/13>> sputum culture: Pending  Procedures: None  Consults: PCCM  Subjective: Lying comfortably in bed-on room air this morning. Coughing continues.  Objective: Vitals: Blood pressure 120/68, pulse 80, temperature 98.7 F (37.1 C), temperature source Oral, resp. rate 14, height '5\' 5"'$  (1.651 m), weight 78 kg, SpO2 95 %.   Exam: Gen Exam:Alert awake-not in any distress HEENT:atraumatic, normocephalic Chest: Bibasilar rales. CVS:S1S2 regular Abdomen:soft non tender, non distended Extremities:no edema Neurology: Non focal Skin: no rash   Pertinent Labs/Radiology:    Latest Ref Rng & Units 07/31/2022   10:18 AM  07/31/2022    5:48 AM 07/30/2022    7:16 PM  CBC  WBC 4.0 - 10.5 K/uL 9.6  9.5    Hemoglobin 13.0 - 17.0 g/dL 12.7  12.1  13.6   Hematocrit 39.0 - 52.0 % 39.9  37.3  40.0   Platelets 150 - 400 K/uL 66  61      Lab Results  Component Value Date   NA 141 08/01/2022   K 3.3 (L) 08/01/2022   CL 111 08/01/2022   CO2 23 08/01/2022      Assessment/Plan: Severe sepsis Acute hypoxic respiratory failure Multifocal infiltrates right> left-unclear etiologies-multiple differentials including  Atypical organisms causing PNA, cryptogenic organizing pneumonia, ILD and other etiologies (see work-up above). Although has history of HFrEF-does not appear volume overloaded-checking BNP. Stable on either room air or on minimal amount of oxygen  For bronchoscopy today-appreciate PCCM input  In the interim-continue Rocephin/Zithromax (had no response to outpatient Augmentin/Doxy)  AKI Suspect hemodynamically mediated Improving with supportive care Avoid nephrotoxic agents  Thrombocytopenia Possibly from sepsis Slowly improving Continue to follow closely If worsens-we can commence further work-up  Hypokalemia Replete/recheck.  Chronic HFrEF Euvolemic-no evidence of volume overload. Continue Entresto/beta-blocker/Farxiga Hold diuretics-allow creatinine to recover.  CAD s/p CABG No anginal symptoms Continue aspirin/statin/beta-blocker  History of PAF-s/p ablation Maintaining sinus rhythm Follows with [facility]-cardiology-not on anticoagulation-on aspirin. Monitor in telemetry  HLD Continue statin/Zetia  DM-2 (A1c 7.0 on 9/25) Continue insulin 70/30 50 units in a.m. and 60 units in p.m.-monitor for 24 hours and adjust accordingly.  Recent Labs    07/31/22 1644 07/31/22 2142 08/01/22 6270  GLUCAP 143* 122* 159*     Tobacco abuse Continue transdermal nicotine Counseled  BMI: Estimated body mass index is 28.62 kg/m as calculated from the following:   Height as of this  encounter: '5\' 5"'$  (1.651 m).   Weight as of this encounter: 78 kg.   Code status:   Code Status: Full Code   DVT Prophylaxis: Place and maintain sequential compression device Start: 07/30/22 2203   Family Communication: None at bedside   Disposition Plan: Status is: Inpatient Remains inpatient appropriate because: Significant lung infiltrates-hypoxic-not stable for discharge.   Planned Discharge Destination:Home   Diet: Diet Order             Diet NPO time specified  Diet effective midnight                     Antimicrobial agents: Anti-infectives (From admission, onward)    Start     Dose/Rate Route Frequency Ordered Stop   07/31/22 2200  cefTRIAXone (ROCEPHIN) 2 g in sodium chloride 0.9 % 100 mL IVPB        2 g 200 mL/hr over 30 Minutes Intravenous Every 24 hours 07/30/22 2157 08/05/22 2159   07/31/22 2200  azithromycin (ZITHROMAX) 500 mg in sodium chloride 0.9 % 250 mL IVPB        500 mg 250 mL/hr over 60 Minutes Intravenous Every 24 hours 07/30/22 2157 08/05/22 2159   07/30/22 1800  cefTRIAXone (ROCEPHIN) 1 g in sodium chloride 0.9 % 100 mL IVPB        1 g 200 mL/hr over 30 Minutes Intravenous  Once 07/30/22 1745 07/30/22 2241   07/30/22 1800  azithromycin (ZITHROMAX) 500 mg in sodium chloride 0.9 % 250 mL IVPB        500 mg 250 mL/hr over 60 Minutes Intravenous  Once 07/30/22 1745 07/31/22 0141        MEDICATIONS: Scheduled Meds:  aspirin  81 mg Oral Daily   atorvastatin  80 mg Oral QHS   buPROPion  300 mg Oral Daily   citalopram  20 mg Oral Daily   empagliflozin  25 mg Oral Daily   ezetimibe  10 mg Oral Daily   gabapentin  600 mg Oral QHS   guaiFENesin  600 mg Oral BID   insulin aspart  0-9 Units Subcutaneous TID WC   insulin aspart protamine- aspart  50 Units Subcutaneous QAC breakfast   insulin aspart protamine- aspart  60 Units Subcutaneous QAC supper   ipratropium-albuterol  3 mL Nebulization QID   linagliptin  5 mg Oral Daily   metoprolol  succinate  100 mg Oral Daily   nicotine  21 mg Transdermal Daily   sacubitril-valsartan  0.5 tablet Oral BID   traZODone  100 mg Oral QHS   Continuous Infusions:  azithromycin 500 mg (07/31/22 2053)   cefTRIAXone (ROCEPHIN)  IV Stopped (07/31/22 2152)   PRN Meds:.acetaminophen **OR** acetaminophen, chlorpheniramine-HYDROcodone, furosemide, magnesium hydroxide, metoCLOPramide, nitroGLYCERIN, ondansetron **OR** ondansetron (ZOFRAN) IV, traZODone   I have personally reviewed following labs and imaging studies  LABORATORY DATA: CBC: Recent Labs  Lab 07/30/22 1825 07/30/22 1916 07/31/22 0548 07/31/22 1018  WBC 11.9*  --  9.5 9.6  NEUTROABS 9.5*  --   --  7.9*  HGB 12.8* 13.6 12.1* 12.7*  HCT 40.2 40.0 37.3* 39.9  MCV 88.7  --  88.6 90.5  PLT 68*  --  61* 66*     Basic Metabolic Panel: Recent Labs  Lab 07/30/22 1825 07/30/22  1916 07/31/22 0548 08/01/22 0257  NA 138 140 143 141  K 2.9* 2.9* 3.2* 3.3*  CL 101  --  108 111  CO2 26  --  24 23  GLUCOSE 152*  --  140* 52*  BUN 32*  --  31* 30*  CREATININE 2.05*  --  1.85* 1.70*  CALCIUM 7.9*  --  7.4* 7.7*     GFR: Estimated Creatinine Clearance: 44 mL/min (A) (by C-G formula based on SCr of 1.7 mg/dL (H)).  Liver Function Tests: Recent Labs  Lab 07/30/22 1825 08/01/22 0257  AST 32 39  ALT 21 25  ALKPHOS 60 58  BILITOT 2.4* 0.7  PROT 6.5 6.0*  ALBUMIN 3.2* 2.6*    No results for input(s): "LIPASE", "AMYLASE" in the last 168 hours. No results for input(s): "AMMONIA" in the last 168 hours.  Coagulation Profile: Recent Labs  Lab 07/31/22 0548  INR 1.3*     Cardiac Enzymes: No results for input(s): "CKTOTAL", "CKMB", "CKMBINDEX", "TROPONINI" in the last 168 hours.  BNP (last 3 results) No results for input(s): "PROBNP" in the last 8760 hours.  Lipid Profile: No results for input(s): "CHOL", "HDL", "LDLCALC", "TRIG", "CHOLHDL", "LDLDIRECT" in the last 72 hours.  Thyroid Function Tests: No results  for input(s): "TSH", "T4TOTAL", "FREET4", "T3FREE", "THYROIDAB" in the last 72 hours.  Anemia Panel: No results for input(s): "VITAMINB12", "FOLATE", "FERRITIN", "TIBC", "IRON", "RETICCTPCT" in the last 72 hours.  Urine analysis:    Component Value Date/Time   COLORURINE YELLOW 07/31/2022 0013   APPEARANCEUR CLEAR 07/31/2022 0013   LABSPEC 1.021 07/31/2022 0013   PHURINE 5.0 07/31/2022 0013   GLUCOSEU >=500 (A) 07/31/2022 0013   HGBUR NEGATIVE 07/31/2022 0013   BILIRUBINUR NEGATIVE 07/31/2022 0013   BILIRUBINUR negative 10/29/2021 1113   KETONESUR NEGATIVE 07/31/2022 0013   PROTEINUR 100 (A) 07/31/2022 0013   UROBILINOGEN 0.2 10/29/2021 1113   UROBILINOGEN 1.0 12/02/2011 1456   NITRITE NEGATIVE 07/31/2022 0013   LEUKOCYTESUR NEGATIVE 07/31/2022 0013    Sepsis Labs: Lactic Acid, Venous    Component Value Date/Time   LATICACIDVEN 1.3 07/30/2022 1825    MICROBIOLOGY: Recent Results (from the past 240 hour(s))  Blood culture (routine x 2)     Status: None (Preliminary result)   Collection Time: 07/30/22 10:30 AM   Specimen: BLOOD RIGHT ARM  Result Value Ref Range Status   Specimen Description BLOOD RIGHT ARM  Final   Special Requests   Final    BOTTLES DRAWN AEROBIC AND ANAEROBIC Blood Culture results may not be optimal due to an inadequate volume of blood received in culture bottles   Culture   Final    NO GROWTH < 24 HOURS Performed at [facility], [place] [address], [place], [place] [address]    Report Status PENDING  Incomplete  Blood culture (routine x 2)     Status: None (Preliminary result)   Collection Time: 07/30/22  6:25 PM   Specimen: BLOOD  Result Value Ref Range Status   Specimen Description BLOOD LEFT ANTECUBITAL  Final   Special Requests   Final    BOTTLES DRAWN AEROBIC AND ANAEROBIC Blood Culture adequate volume   Culture   Final    NO GROWTH 2 DAYS Performed at [facility], [place] [address], [place], [place] [address]    Report Status  PENDING  Incomplete  Resp Panel by RT-PCR (Flu A&B, Covid) Anterior Nasal Swab     Status: None   Collection Time: 07/30/22  6:31 PM   Specimen: Anterior Nasal Swab  Result Value Ref Range Status   SARS Coronavirus 2 by RT PCR NEGATIVE NEGATIVE Final    Comment: (NOTE) SARS-CoV-2 target nucleic acids are NOT DETECTED.  The SARS-CoV-2 RNA is generally detectable in upper respiratory specimens during the acute phase of infection. The lowest concentration of SARS-CoV-2 viral copies this assay can detect is 138 copies/mL. A negative result does not preclude SARS-Cov-2 infection and should not be used as the sole basis for treatment or other patient management decisions. A negative result may occur with  improper specimen collection/handling, submission of specimen other than nasopharyngeal swab, presence of viral mutation(s) within the areas targeted by this assay, and inadequate number of viral copies(<138 copies/mL). A negative result must be combined with clinical observations, patient history, and epidemiological information. The expected result is Negative.  Fact Sheet for Patients:  EntrepreneurPulse.com.au  Fact Sheet for Healthcare Providers:  IncredibleEmployment.be  This test is no t yet approved or cleared by the [place] FDA and  has been authorized for detection and/or diagnosis of SARS-CoV-2 by FDA under an Emergency Use Authorization (EUA). This EUA will remain  in effect (meaning this test can be used) for the duration of the COVID-19 declaration under Section 564(b)(1) of the Act, 21 U.S.C.section 360bbb-3(b)(1), unless the authorization is terminated  or revoked sooner.       Influenza A by PCR NEGATIVE NEGATIVE Final   Influenza B by PCR NEGATIVE NEGATIVE Final    Comment: (NOTE) The Xpert Xpress SARS-CoV-2/FLU/RSV plus assay is intended as an aid in the diagnosis of influenza from Nasopharyngeal swab specimens  and should not be used as a sole basis for treatment. Nasal washings and aspirates are unacceptable for Xpert Xpress SARS-CoV-2/FLU/RSV testing.  Fact Sheet for Patients: EntrepreneurPulse.com.au  Fact Sheet for Healthcare Providers: IncredibleEmployment.be  This test is not yet approved or cleared by the [place] FDA and has been authorized for detection and/or diagnosis of SARS-CoV-2 by FDA under an Emergency Use Authorization (EUA). This EUA will remain in effect (meaning this test can be used) for the duration of the COVID-19 declaration under Section 564(b)(1) of the Act, 21 U.S.C. section 360bbb-3(b)(1), unless the authorization is terminated or revoked.  Performed at [facility], [place] [address], [place], [place] [address]   Respiratory (~20 pathogens) panel by PCR     Status: None   Collection Time: 07/31/22 10:20 AM   Specimen: Nasopharyngeal Swab; Respiratory  Result Value Ref Range Status   Adenovirus NOT DETECTED NOT DETECTED Final   Coronavirus 229E NOT DETECTED NOT DETECTED Final    Comment: (NOTE) The Coronavirus on the Respiratory Panel, DOES NOT test for the novel  Coronavirus (2019 nCoV)    Coronavirus HKU1 NOT DETECTED NOT DETECTED Final   Coronavirus NL63 NOT DETECTED NOT DETECTED Final   Coronavirus OC43 NOT DETECTED NOT DETECTED Final   Metapneumovirus NOT DETECTED NOT DETECTED Final   Rhinovirus / Enterovirus NOT DETECTED NOT DETECTED Final   Influenza A NOT DETECTED NOT DETECTED Final   Influenza B NOT DETECTED NOT DETECTED Final   Parainfluenza Virus 1 NOT DETECTED NOT DETECTED Final   Parainfluenza Virus 2 NOT DETECTED NOT DETECTED Final   Parainfluenza Virus 3 NOT DETECTED NOT DETECTED Final   Parainfluenza Virus 4 NOT DETECTED NOT DETECTED Final   Respiratory Syncytial Virus NOT DETECTED NOT DETECTED Final   Bordetella pertussis NOT DETECTED NOT DETECTED Final   Bordetella Parapertussis NOT DETECTED  NOT  DETECTED Final   Chlamydophila pneumoniae NOT DETECTED NOT DETECTED Final   Mycoplasma pneumoniae NOT DETECTED NOT DETECTED Final    Comment: Performed at [facility], [place] [address] [place], [place] [address]  Expectorated Sputum Assessment w Gram Stain, Rflx to Resp Cult     Status: None (Preliminary result)   Collection Time: 07/31/22  2:02 PM   Specimen: Expectorated Sputum  Result Value Ref Range Status   Specimen Description EXPECTORATED SPUTUM  Final   Special Requests NONE  Final   Sputum evaluation   Final    THIS SPECIMEN IS ACCEPTABLE FOR SPUTUM CULTURE Performed at [facility], [place] [address] [place], [place] [address]    Report Status PENDING  Incomplete  Culture, Respiratory w Gram Stain     Status: None (Preliminary result)   Collection Time: 07/31/22  2:02 PM  Result Value Ref Range Status   Specimen Description EXPECTORATED SPUTUM  Final   Special Requests NONE Reflexed from [id number]  Final   Gram Stain   Final    FEW SQUAMOUS EPITHELIAL CELLS PRESENT FEW WBC PRESENT, PREDOMINANTLY MONONUCLEAR FEW GRAM POSITIVE RODS FEW GRAM NEGATIVE RODS Performed at [facility], [place] [address] [place], [place] [address]    Culture PENDING  Incomplete   Report Status PENDING  Incomplete    RADIOLOGY STUDIES/RESULTS: ECHOCARDIOGRAM COMPLETE  Result Date: 07/31/2022    ECHOCARDIOGRAM REPORT   Patient Name:   [name] Date of Exam: 07/31/2022 Medical Rec #:  [id number]      Height:       65.0 in Accession #:    [id number]     Weight:       171.5 lb Date of Birth:  Feb 27, 1961      BSA:          1.853 m Patient Age:    49 years       BP:           115/62 mmHg Patient Gender: M              HR:           83 bpm. Exam Location:  Inpatient Procedure: 2D Echo, Cardiac Doppler and Color Doppler Indications:    congestive heart failure  History:        Patient has prior history of Echocardiogram examinations, most                 recent 11/24/2016. Prior CABG;  Risk Factors:Hypertension, Diabetes                 and Sleep Apnea.  Sonographer:    [employer] Referring Phys: [id number] [place]  1. Left ventricular ejection fraction, by estimation, is 60 to 65%. The left ventricle has normal function. The left ventricle has no regional wall motion abnormalities. There is moderate left ventricular hypertrophy. Left ventricular diastolic parameters are indeterminate.  2. Right ventricular systolic function is normal. The right ventricular size is normal. There is mildly elevated pulmonary artery systolic pressure.  3. Left atrial size was mildly dilated.  4. Right atrial size was mildly dilated.  5. The mitral valve is normal in structure. No evidence of mitral valve regurgitation. Moderate mitral annular calcification.  6. The aortic valve is tricuspid. There is mild calcification of the aortic valve. Aortic valve regurgitation is not visualized.  7. The inferior vena cava is dilated in size with >50% respiratory variability, suggesting right  atrial pressure of 8 mmHg. FINDINGS  Left Ventricle: Left ventricular ejection fraction, by estimation, is 60 to 65%. The left ventricle has normal function. The left ventricle has no regional wall motion abnormalities. The left ventricular internal cavity size was normal in size. There is  moderate left ventricular hypertrophy. Left ventricular diastolic parameters are indeterminate. Right Ventricle: The right ventricular size is normal. No increase in right ventricular wall thickness. Right ventricular systolic function is normal. There is mildly elevated pulmonary artery systolic pressure. The tricuspid regurgitant velocity is 2.90  m/s, and with an assumed right atrial pressure of 8 mmHg, the estimated right ventricular systolic pressure is 00.8 mmHg. Left Atrium: Left atrial size was mildly dilated. Right Atrium: Right atrial size was mildly dilated. Pericardium: There is no evidence of pericardial  effusion. Mitral Valve: The mitral valve is normal in structure. Moderate mitral annular calcification. No evidence of mitral valve regurgitation. Tricuspid Valve: The tricuspid valve is normal in structure. Tricuspid valve regurgitation is mild. Aortic Valve: The aortic valve is tricuspid. There is mild calcification of the aortic valve. Aortic valve regurgitation is not visualized. Pulmonic Valve: The pulmonic valve was grossly normal. Pulmonic valve regurgitation is not visualized. Aorta: The aortic root and ascending aorta are structurally normal, with no evidence of dilitation. Venous: The inferior vena cava is dilated in size with greater than 50% respiratory variability, suggesting right atrial pressure of 8 mmHg. IAS/Shunts: No atrial level shunt detected by color flow Doppler.  LEFT VENTRICLE PLAX 2D LVIDd:         4.40 cm   Diastology LVIDs:         3.40 cm   LV e' medial:  5.66 cm/s LV PW:         1.20 cm   LV e' lateral: 7.94 cm/s LV IVS:        1.20 cm LVOT diam:     2.00 cm LV SV:         59 LV SV Index:   32 LVOT Area:     3.14 cm  RIGHT VENTRICLE             IVC RV S prime:     12.50 cm/s  IVC diam: 2.30 cm TAPSE (M-mode): 2.1 cm LEFT ATRIUM             Index        RIGHT ATRIUM           Index LA diam:        4.60 cm 2.48 cm/m   RA Area:     15.60 cm LA Vol (A2C):   54.8 ml 29.57 ml/m  RA Volume:   40.30 ml  21.75 ml/m LA Vol (A4C):   62.4 ml 33.67 ml/m LA Biplane Vol: 58.5 ml 31.57 ml/m  AORTIC VALVE LVOT Vmax:   94.20 cm/s LVOT Vmean:  68.900 cm/s LVOT VTI:    0.189 m  AORTA Ao Root diam: 3.10 cm Ao Asc diam:  2.80 cm TRICUSPID VALVE TR Peak grad:   33.6 mmHg TR Vmax:        290.00 cm/s  SHUNTS Systemic VTI:  0.19 m Systemic Diam: 2.00 cm Phineas Inches Electronically signed by Phineas Inches Signature Date/Time: 07/31/2022/6:12:24 PM    Final    CT Angio Chest PE W and/or Wo Contrast  Result Date: 07/30/2022 CLINICAL DATA:  Pulmonary embolism suspected, high probability. Shortness of breath  and cough for 6 weeks. EXAM: CT ANGIOGRAPHY CHEST WITH CONTRAST TECHNIQUE:  Multidetector CT imaging of the chest was performed using the standard protocol during bolus administration of intravenous contrast. Multiplanar CT image reconstructions and MIPs were obtained to evaluate the vascular anatomy. RADIATION DOSE REDUCTION: This exam was performed according to the departmental dose-optimization program which includes automated exposure control, adjustment of the mA and/or kV according to patient size and/or use of iterative reconstruction technique. CONTRAST:  67m OMNIPAQUE IOHEXOL 350 MG/ML SOLN COMPARISON:  01/01/2022. FINDINGS: Cardiovascular: The heart is enlarged and there is no pericardial effusion. Multi-vessel coronary artery calcifications are noted. There is atherosclerotic calcification of the aorta without evidence of aneurysm. The pulmonary trunk is normal in caliber. No pulmonary artery filling defect is identified. Mediastinum/Nodes: Mediastinal and hilar lymphadenopathy is noted bilaterally. No axillary lymphadenopathy. The thyroid gland, trachea, and esophagus are within normal limits. Lungs/Pleura: There is a small right pleural effusion. No pneumothorax. Diffuse patchy airspace disease and interlobular septal thickening are noted bilaterally, greater on the right than on the left. Upper Abdomen: No acute abnormality. Musculoskeletal: Sternotomy wires are noted. Degenerative changes are present in the thoracic spine. No acute osseous abnormality. Review of the MIP images confirms the above findings. IMPRESSION: 1. No evidence of pulmonary embolism. 2. Diffuse patchy airspace disease and interlobular septal thickening in the lungs bilaterally, possible edema versus multifocal pneumonia. 3. Small right pleural effusion. 4. Cardiomegaly with multi-vessel coronary artery calcifications. 5. Aortic atherosclerosis. Electronically Signed   By: LBrett FairyM.D.   On: 07/30/2022 20:59   DG Chest 1  View  Result Date: 07/30/2022 CLINICAL DATA:  Cough EXAM: CHEST  1 VIEW COMPARISON:  10/09/2019 FINDINGS: Postop CABG.  Cardiac enlargement. Right upper lobe airspace disease is new and appears acute. This could be infection or edema. Mild vascular congestion. No pleural effusion. IMPRESSION: Right upper lobe infiltrate may represent edema or pneumonia. Cardiac enlargement with vascular congestion. Electronically Signed   By: CFranchot GalloM.D.   On: 07/30/2022 18:17     LOS: 2 days   SOren Binet MD  [facility]    To contact the attending provider between 7A-7P or the covering provider during after hours 7P-7A, please log into the web site [email or web address] and access using universal [place] password for that web site. If you do not have the password, please call the hospital operator.  08/01/2022, 9:48 AM

## 2022-08-01 NOTE — Anesthesia Procedure Notes (Signed)
Procedure Name: Intubation Date/Time: 08/01/2022 3:08 PM  Performed by: Babs Bertin, CRNAPre-anesthesia Checklist: Patient identified, Emergency Drugs available, Suction available and Patient being monitored Patient Re-evaluated:Patient Re-evaluated prior to induction Oxygen Delivery Method: Circle System Utilized Preoxygenation: Pre-oxygenation with 100% oxygen Induction Type: IV induction Ventilation: Mask ventilation without difficulty Laryngoscope Size: Mac and 3 Grade View: Grade II Tube type: Oral Tube size: 7.5 mm Number of attempts: 1 Airway Equipment and Method: Stylet and Oral airway Placement Confirmation: ETT inserted through vocal cords under direct vision, positive ETCO2 and breath sounds checked- equal and bilateral Secured at: 22 cm Tube secured with: Tape Dental Injury: Teeth and Oropharynx as per pre-operative assessment

## 2022-08-01 NOTE — Anesthesia Postprocedure Evaluation (Signed)
Anesthesia Post Note  Patient: [name]  Procedure(s) Performed: VIDEO BRONCHOSCOPY WITH FLUORO (Right) BRONCHIAL BRUSHINGS BRONCHIAL WASHINGS     Patient location during evaluation: PACU Anesthesia Type: General Level of consciousness: awake and alert Pain management: pain level controlled Vital Signs Assessment: post-procedure vital signs reviewed and stable Respiratory status: spontaneous breathing, nonlabored ventilation, respiratory function stable and patient connected to nasal cannula oxygen Cardiovascular status: blood pressure returned to baseline and stable Postop Assessment: no apparent nausea or vomiting Anesthetic complications: no   No notable events documented.  Last Vitals:  Vitals:   08/01/22 1558 08/01/22 1608  BP: 112/71   Pulse: 80 78  Resp: 20 (!) 24  Temp:    SpO2: 91% 93%    Last Pain:  Vitals:   08/01/22 1608  TempSrc:   PainSc: 0-No pain                 Belenda Cruise P Randle Shatzer

## 2022-08-01 NOTE — Transfer of Care (Signed)
Immediate Anesthesia Transfer of Care Note  Patient: [name]  Procedure(s) Performed: VIDEO BRONCHOSCOPY WITH FLUORO (Right) BRONCHIAL BRUSHINGS BRONCHIAL WASHINGS  Patient Location: PACU  Anesthesia Type:General  Level of Consciousness: awake, alert , and oriented  Airway & Oxygen Therapy: Patient Spontanous Breathing and Patient connected to face mask oxygen  Post-op Assessment: Report given to RN and Post -op Vital signs reviewed and stable  Post vital signs: Reviewed and stable  Last Vitals:  Vitals Value Taken Time  BP 105/59 08/01/22 1543  Temp 36.2 C 08/01/22 1543  Pulse 81 08/01/22 1545  Resp 18 08/01/22 1545  SpO2 94 % 08/01/22 1545  Vitals shown include unvalidated device data.  Last Pain:  Vitals:   08/01/22 1543  TempSrc: Temporal  PainSc: Asleep         Complications: No notable events documented.

## 2022-08-01 NOTE — Progress Notes (Signed)
08/01/2022   I have seen and evaluated the patient for nonresolving pneumonia.  S:  No events, continues to have coughing fits.  O2 needs improved.  O: Blood pressure (!) 142/81, pulse 79, temperature 98.8 F (37.1 C), temperature source Oral, resp. rate 20, height _0  (1.651 m), weight 78 kg, SpO2 97 %.  No distress Rhonci a bit worse on right Abd soft Ext warm Moves ext to command  A:  Nonresolving PNA in smoker- warrants tissue sampling and BAL  P:  - Bronch today and likely will start empiric steroids for COP vs. RBILD.  Not sure transbronchs will buy [place] much here and high risk for PTX+hemorrhage, will stick with brushing+BAL  Erskine Emery MD [facility] Prefer epic messenger for cross cover needs If after hours, please call E-link

## 2022-08-01 NOTE — Op Note (Signed)
Bronchoscopy Procedure Note  [name]  [id number]  03/14/1961  Date:08/01/22  Time:3:37 PM   Provider Performing:Jomayra Novitsky C Tamala Julian   Procedure(s):  Flexible bronchoscopy with brushing [phone]) and Flexible bronchoscopy with bronchial alveolar lavage ([id number])  Indication(s) Persistent pneumonia  Consent Risks of the procedure as well as the alternatives and risks of each were explained to the patient and/or caregiver.  Consent for the procedure was obtained and is signed in the bedside chart  Anesthesia General   Time Out Verified patient identification, verified procedure, site/side was marked, verified correct patient position, special equipment/implants available, medications/allergies/relevant history reviewed, required imaging and test results available.   Sterile Technique Usual hand hygiene, masks, gowns, and gloves were used   Procedure Description Bronchoscope advanced through endotracheal tube and into airway.  Airways were examined down to subsegmental level with findings noted below.   Following diagnostic evaluation, BAL(s) performed in RUL with normal saline and return of bloody/cloudy fluid and Brushing(s) performed in RUL posterior subsegment  Findings:  - Copious mucopurulent secretions lower > upper lobe - Sequential BAL with no evidence of alveolitis  Complications/Tolerance None; patient tolerated the procedure well. Chest X-ray is not needed post procedure.   EBL Minimal   Specimen(s) RUL BAL: culture and cyto RUL Brushing: cyto only

## 2022-08-01 NOTE — Plan of Care (Signed)
  Problem: Fluid Volume: Goal: Hemodynamic stability will improve Outcome: Progressing   Problem: Clinical Measurements: Goal: Diagnostic test results will improve Outcome: Progressing Goal: Signs and symptoms of infection will decrease Outcome: Progressing   Problem: Respiratory: Goal: Ability to maintain adequate ventilation will improve Outcome: Progressing   Problem: Clinical Measurements: Goal: Ability to maintain clinical measurements within normal limits will improve Outcome: Progressing Goal: Will remain free from infection Outcome: Progressing Goal: Diagnostic test results will improve Outcome: Progressing Goal: Respiratory complications will improve Outcome: Progressing Goal: Cardiovascular complication will be avoided Outcome: Progressing   Problem: Activity: Goal: Risk for activity intolerance will decrease Outcome: Progressing

## 2022-08-02 ENCOUNTER — Inpatient Hospital Stay (HOSPITAL_COMMUNITY): Payer: Medicare Other

## 2022-08-02 DIAGNOSIS — A419 Sepsis, unspecified organism: Secondary | ICD-10-CM | POA: Diagnosis not present

## 2022-08-02 DIAGNOSIS — J9601 Acute respiratory failure with hypoxia: Secondary | ICD-10-CM | POA: Diagnosis not present

## 2022-08-02 DIAGNOSIS — I10 Essential (primary) hypertension: Secondary | ICD-10-CM | POA: Diagnosis not present

## 2022-08-02 DIAGNOSIS — J189 Pneumonia, unspecified organism: Secondary | ICD-10-CM | POA: Diagnosis not present

## 2022-08-02 DIAGNOSIS — E876 Hypokalemia: Secondary | ICD-10-CM | POA: Diagnosis not present

## 2022-08-02 LAB — BASIC METABOLIC PANEL
Anion gap: 8 (ref 5–15)
BUN: 29 mg/dL — ABNORMAL HIGH (ref 8–23)
CO2: 23 mmol/L (ref 22–32)
Calcium: 8 mg/dL — ABNORMAL LOW (ref 8.9–10.3)
Chloride: 111 mmol/L (ref 98–111)
Creatinine, Ser: 1.55 mg/dL — ABNORMAL HIGH (ref 0.61–1.24)
GFR, Estimated: 51 mL/min — ABNORMAL LOW (ref >60)
Glucose, Bld: 252 mg/dL — ABNORMAL HIGH (ref 70–99)
Potassium: 4.6 mmol/L (ref 3.5–5.1)
Sodium: 142 mmol/L (ref 135–145)

## 2022-08-02 LAB — CBC
HCT: 35.9 % — ABNORMAL LOW (ref 39.0–52.0)
Hemoglobin: 11.5 g/dL — ABNORMAL LOW (ref 13.0–17.0)
MCH: 28.5 pg (ref 26.0–34.0)
MCHC: 32 g/dL (ref 30.0–36.0)
MCV: 88.9 fL (ref 80.0–100.0)
Platelets: 77 10*3/uL — ABNORMAL LOW (ref 150–400)
RBC: 4.04 MIL/uL — ABNORMAL LOW (ref 4.22–5.81)
RDW: 16.4 % — ABNORMAL HIGH (ref 11.5–15.5)
WBC: 5 10*3/uL (ref 4.0–10.5)
nRBC: 0 % (ref 0.0–0.2)

## 2022-08-02 LAB — GLUCOSE, CAPILLARY
Glucose-Capillary: 242 mg/dL — ABNORMAL HIGH (ref 70–99)
Glucose-Capillary: 240 mg/dL — ABNORMAL HIGH (ref 70–99)
Glucose-Capillary: 239 mg/dL — ABNORMAL HIGH (ref 70–99)
Glucose-Capillary: 196 mg/dL — ABNORMAL HIGH (ref 70–99)

## 2022-08-02 LAB — MAGNESIUM: Magnesium: 2 mg/dL (ref 1.7–2.4)

## 2022-08-02 LAB — ANA W/REFLEX IF POSITIVE: Anti Nuclear Antibody (ANA): NEGATIVE

## 2022-08-02 LAB — RHEUMATOID FACTOR: Rheumatoid fact SerPl-aCnc: 16.2 IU/mL — ABNORMAL HIGH (ref <14.0)

## 2022-08-02 MED ORDER — PREDNISONE 20 MG PO TABS
40.0000 mg | ORAL_TABLET | Freq: Every day | ORAL | Status: DC
  Administered 2022-08-02 – 2022-08-03 (×2): 40 mg via ORAL
  Filled 2022-08-02 (×2): qty 2

## 2022-08-02 NOTE — Progress Notes (Signed)
08/02/2022 I have seen and evaluated the patient for nonresolving pneumonia.  S:  No events. Cough is better but still having issues with breathing.  O: Blood pressure 108/60, pulse 69, temperature 98.7 F (37.1 C), temperature source Oral, resp. rate 18, height _0  (1.651 m), weight 78 kg, SpO2 96 %.  No distress Rhonci a bit worse on right stable Abd soft Ext warm Moves ext to command  Unfortunately cell count/diff not sent with BAL  A:  Nonresolving PNA in smoker- will treat as COP and PNA for now.  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  P:  - Smoking cessation counseling given - Prednisone 60m/day - Continue ceftriaxone/doxy, can transition to cefdinir and doxy on DC - IS and flutter - Will see tomorrow and establish OP f/u plan - Needs walking pulse ox prior to [place]ic messenger for cross cover needs If after hours, please call E-link

## 2022-08-02 NOTE — Progress Notes (Signed)
SATURATION QUALIFICATIONS:   Patient Saturations on Room Air at Rest = 88%  Patient Saturations on Room Air while Ambulating = 85%  Patient Saturations on 3 Liters of oxygen while Ambulating = 96%  Please briefly explain why patient needs home oxygen: Desaturation on room air with ambulation.

## 2022-08-02 NOTE — Plan of Care (Signed)
  Problem: Fluid Volume: Goal: Hemodynamic stability will improve Outcome: Progressing   Problem: Clinical Measurements: Goal: Diagnostic test results will improve Outcome: Progressing Goal: Signs and symptoms of infection will decrease Outcome: Progressing   Problem: Respiratory: Goal: Ability to maintain adequate ventilation will improve Outcome: Progressing   Problem: Education: Goal: Knowledge of General Education information will improve Description: Including pain rating scale, medication(s)/side effects and non-pharmacologic comfort measures Outcome: Progressing   Problem: Health Behavior/Discharge Planning: Goal: Ability to manage health-related needs will improve Outcome: Progressing   Problem: Clinical Measurements: Goal: Ability to maintain clinical measurements within normal limits will improve Outcome: Progressing Goal: Will remain free from infection Outcome: Progressing Goal: Diagnostic test results will improve Outcome: Progressing Goal: Respiratory complications will improve Outcome: Progressing Goal: Cardiovascular complication will be avoided Outcome: Progressing   Problem: Activity: Goal: Risk for activity intolerance will decrease Outcome: Progressing   Problem: Nutrition: Goal: Adequate nutrition will be maintained Outcome: Progressing   

## 2022-08-02 NOTE — Progress Notes (Addendum)
PROGRESS NOTE        PATIENT DETAILS Name: [name] Age: 61 y.o. Sex: male Date of Birth: 1961/07/12 Admit Date: 07/30/2022 Admitting Physician Christel Mormon, MD OEU:MPNTI, Alyson Locket, NP  Brief Summary: Patient is a 61 y.o.  male with history of HFrEF, PAF-s/p ablation-not on AC, CAD s/p CABG, DM-2, HTN, HLD who presented with worsening cough, weakness, shortness of breath-he was found to have PNA and subsequently admitted to the hospitalist service.  Per patient-he has had cough/cold like symptoms for approximately 4 to 6 weeks (claims he always gets URI/coughs as he picks up a virus from his small grandchildren) -he was seen by his PCP and initially given doxycycline without any improvement, he was then started on Augmentin which he apparently took for 5/7 days without any improvement.    Significant events: 10/12>> admit to [facility]-hypoxia/multifocal lung infiltrates on imaging studies. 10/14>> bronchoscopy-steroids started.  Significant studies: 10/12>> CXR: Right upper lobe PNA 10/12>> CTA chest: Multifocal infiltrates-right> left.  No PE. 10/13>> ANCA profile: Pending 10/13>> ANA: Pending 10/13>> RA factor: Pending 10/13>> CRP: 29.7  Significant microbiology data: 10/12>> HIV negative 10/12>> COVID/influenza PCR: Negative 10/12>> respiratory virus panel: Negative 10/12>> blood culture: No growth 10/13>> sputum culture: Pending 10/14>> BAL culture: Pending 10/14>> BAL AFB smear: Pending 10/14>> BAL AFB culture: Pending 10/14>> BAL fungal culture: Pending  Pathology data 10/14>> BAL cytology: Pending  Procedures: 10/14>> bronchoscopy  Consults: PCCM  Subjective: Claims breathing not any better Coughing Comfortable at rest  Objective: Vitals: Blood pressure 108/60, pulse 69, temperature 98.7 F (37.1 C), temperature source Oral, resp. rate 18, height _0  (1.651 m), weight 78 kg, SpO2 96 %.   Exam: Gen Exam:Alert awake-not in any  distress HEENT:atraumatic, normocephalic Chest: Bibasilar rales. CVS:S1S2 regular Abdomen:soft non tender, non distended Extremities:no edema Neurology: Non focal Skin: no rash   Pertinent Labs/Radiology:    Latest Ref Rng & Units 08/02/2022    4:23 AM 07/31/2022   10:18 AM 07/31/2022    5:48 AM  CBC  WBC 4.0 - 10.5 K/uL 5.0  9.6  9.5   Hemoglobin 13.0 - 17.0 g/dL 11.5  12.7  12.1   Hematocrit 39.0 - 52.0 % 35.9  39.9  37.3   Platelets 150 - 400 K/uL 77  66  61     Lab Results  Component Value Date   NA 142 08/02/2022   K 4.6 08/02/2022   CL 111 08/02/2022   CO2 23 08/02/2022      Assessment/Plan: Severe sepsis Acute hypoxic respiratory failure Multifocal infiltrates right> left-unclear etiologies-multiple differentials including  Atypical organisms causing PNA, cryptogenic organizing pneumonia, ILD and other etiologies (see work-up above).  No response to Augmentin/Doxy in the outpatient setting. Although has history of HFrEF-known evidence of volume overload clinically. Remains essentially the same-no significant improvement since admission-stable on just 2-3 L of oxygen. Continue Rocephin/doxycycline-steroids started 10/14 following bronchoscopy. Await BAL cultures/autoimmune panel Need to assess for home O2 prior to discharge.  AKI Suspect hemodynamically mediated Improving with supportive care Avoid nephrotoxic agents  Thrombocytopenia Possibly from sepsis/underlying lung issues. Slowly improving  Hypokalemia Repleted.  Chronic HFrEF Euvolemic-no evidence of volume overload. Continue Entresto/beta-blocker/Farxiga Hold diuretics-allow creatinine to recover.  CAD s/p CABG No anginal symptoms Continue aspirin/statin/beta-blocker  History of PAF-s/p ablation Maintaining sinus rhythm Follows with [facility]-cardiology-not on anticoagulation-on aspirin. Monitor in telemetry  HLD Continue statin/Zetia  DM-2 (A1c 7.0 on 9/25) Continue insulin 70/30 50 units  in a.m. and 60 units in p.m.-monitor for 24 hours and adjust accordingly.  Recent Labs    08/01/22 1827 08/01/22 2313 08/02/22 0758  GLUCAP 123* 260* 242*     Tobacco abuse Continue transdermal nicotine Counseled  BMI: Estimated body mass index is 28.62 kg/m as calculated from the following:   Height as of this encounter: _0  (1.651 m).   Weight as of this encounter: 78 kg.   Code status:   Code Status: Full Code   DVT Prophylaxis: Place and maintain sequential compression device Start: 07/30/22 2203   Family Communication: None at bedside   Disposition Plan: Status is: Inpatient Remains inpatient appropriate because: Significant lung infiltrates-hypoxic-not stable for discharge.   Planned Discharge Destination:Home   Diet: Diet Order             Diet Carb Modified Fluid consistency: Thin; Room service appropriate? Yes  Diet effective now                     Antimicrobial agents: Anti-infectives (From admission, onward)    Start     Dose/Rate Route Frequency Ordered Stop   08/01/22 2000  doxycycline (VIBRA-TABS) tablet 100 mg        100 mg Oral Every 12 hours 08/01/22 1307 08/04/22 2159   07/31/22 2200  cefTRIAXone (ROCEPHIN) 2 g in sodium chloride 0.9 % 100 mL IVPB        2 g 200 mL/hr over 30 Minutes Intravenous Every 24 hours 07/30/22 2157 08/05/22 2159   07/31/22 2200  azithromycin (ZITHROMAX) 500 mg in sodium chloride 0.9 % 250 mL IVPB  Status:  Discontinued        500 mg 250 mL/hr over 60 Minutes Intravenous Every 24 hours 07/30/22 2157 08/01/22 1307   07/30/22 1800  cefTRIAXone (ROCEPHIN) 1 g in sodium chloride 0.9 % 100 mL IVPB        1 g 200 mL/hr over 30 Minutes Intravenous  Once 07/30/22 1745 07/30/22 2241   07/30/22 1800  azithromycin (ZITHROMAX) 500 mg in sodium chloride 0.9 % 250 mL IVPB        500 mg 250 mL/hr over 60 Minutes Intravenous  Once 07/30/22 1745 07/31/22 0141        MEDICATIONS: Scheduled Meds:  aspirin  81 mg  Oral Daily   atorvastatin  80 mg Oral QHS   buPROPion  300 mg Oral Daily   citalopram  20 mg Oral Daily   doxycycline  100 mg Oral Q12H   empagliflozin  25 mg Oral Daily   ezetimibe  10 mg Oral Daily   gabapentin  600 mg Oral QHS   guaiFENesin  600 mg Oral BID   insulin aspart  0-9 Units Subcutaneous TID WC   insulin aspart protamine- aspart  50 Units Subcutaneous QAC breakfast   insulin aspart protamine- aspart  60 Units Subcutaneous QAC supper   ipratropium-albuterol  3 mL Nebulization QID   linagliptin  5 mg Oral Daily   metoprolol succinate  100 mg Oral Daily   nicotine  21 mg Transdermal Daily   predniSONE  40 mg Oral Q breakfast   sacubitril-valsartan  0.5 tablet Oral BID   traZODone  100 mg Oral QHS   Continuous Infusions:  cefTRIAXone (ROCEPHIN)  IV 2 g (08/01/22 2038)   PRN Meds:.acetaminophen **OR** acetaminophen, chlorpheniramine-HYDROcodone, furosemide, magnesium hydroxide, metoCLOPramide, nitroGLYCERIN, ondansetron **OR** ondansetron (ZOFRAN) IV,  traZODone   I have personally reviewed following labs and imaging studies  LABORATORY DATA: CBC: Recent Labs  Lab 07/30/22 1825 07/30/22 1916 07/31/22 0548 07/31/22 1018 08/02/22 0423  WBC 11.9*  --  9.5 9.6 5.0  NEUTROABS 9.5*  --   --  7.9*  --   HGB 12.8* 13.6 12.1* 12.7* 11.5*  HCT 40.2 40.0 37.3* 39.9 35.9*  MCV 88.7  --  88.6 90.5 88.9  PLT 68*  --  61* 66* 77*     Basic Metabolic Panel: Recent Labs  Lab 07/30/22 1825 07/30/22 1916 07/31/22 0548 08/01/22 0257 08/02/22 0423  NA 138 140 143 141 142  K 2.9* 2.9* 3.2* 3.3* 4.6  CL 101  --  108 111 111  CO2 26  --  _0 GLUCOSE 152*  --  140* 52* 252*  BUN 32*  --  31* 30* 29*  CREATININE 2.05*  --  1.85* 1.70* 1.55*  CALCIUM 7.9*  --  7.4* 7.7* 8.0*  MG  --   --   --   --  2.0     GFR: Estimated Creatinine Clearance: 48.2 mL/min (A) (by C-G formula based on SCr of 1.55 mg/dL (H)).  Liver Function Tests: Recent Labs  Lab 07/30/22 1825  08/01/22 0257  AST 32 39  ALT 21 25  ALKPHOS 60 58  BILITOT 2.4* 0.7  PROT 6.5 6.0*  ALBUMIN 3.2* 2.6*    No results for input(s): "LIPASE", "AMYLASE" in the last 168 hours. No results for input(s): "AMMONIA" in the last 168 hours.  Coagulation Profile: Recent Labs  Lab 07/31/22 0548  INR 1.3*     Cardiac Enzymes: No results for input(s): "CKTOTAL", "CKMB", "CKMBINDEX", "TROPONINI" in the last 168 hours.  BNP (last 3 results) No results for input(s): "PROBNP" in the last 8760 hours.  Lipid Profile: No results for input(s): "CHOL", "HDL", "LDLCALC", "TRIG", "CHOLHDL", "LDLDIRECT" in the last 72 hours.  Thyroid Function Tests: No results for input(s): "TSH", "T4TOTAL", "FREET4", "T3FREE", "THYROIDAB" in the last 72 hours.  Anemia Panel: No results for input(s): "VITAMINB12", "FOLATE", "FERRITIN", "TIBC", "IRON", "RETICCTPCT" in the last 72 hours.  Urine analysis:    Component Value Date/Time   COLORURINE YELLOW 07/31/2022 0013   APPEARANCEUR CLEAR 07/31/2022 0013   LABSPEC 1.021 07/31/2022 0013   PHURINE 5.0 07/31/2022 0013   GLUCOSEU >=500 (A) 07/31/2022 0013   HGBUR NEGATIVE 07/31/2022 0013   BILIRUBINUR NEGATIVE 07/31/2022 0013   BILIRUBINUR negative 10/29/2021 1113   KETONESUR NEGATIVE 07/31/2022 0013   PROTEINUR 100 (A) 07/31/2022 0013   UROBILINOGEN 0.2 10/29/2021 1113   UROBILINOGEN 1.0 12/02/2011 1456   NITRITE NEGATIVE 07/31/2022 0013   LEUKOCYTESUR NEGATIVE 07/31/2022 0013    Sepsis Labs: Lactic Acid, Venous    Component Value Date/Time   LATICACIDVEN 1.3 07/30/2022 1825    MICROBIOLOGY: Recent Results (from the past 240 hour(s))  Blood culture (routine x 2)     Status: None (Preliminary result)   Collection Time: 07/30/22 10:30 AM   Specimen: BLOOD RIGHT ARM  Result Value Ref Range Status   Specimen Description BLOOD RIGHT ARM  Final   Special Requests   Final    BOTTLES DRAWN AEROBIC AND ANAEROBIC Blood Culture results may not be optimal  due to an inadequate volume of blood received in culture bottles   Culture   Final    NO GROWTH < 24 HOURS Performed at [facility], [place] [address], [place], [place] [address]  Report Status PENDING  Incomplete  Blood culture (routine x 2)     Status: None (Preliminary result)   Collection Time: 07/30/22  6:25 PM   Specimen: BLOOD  Result Value Ref Range Status   Specimen Description BLOOD LEFT ANTECUBITAL  Final   Special Requests   Final    BOTTLES DRAWN AEROBIC AND ANAEROBIC Blood Culture adequate volume   Culture   Final    NO GROWTH 2 DAYS Performed at [facility], [address], [place], [place] [address]    Report Status PENDING  Incomplete  Resp Panel by RT-PCR (Flu A&B, Covid) Anterior Nasal Swab     Status: None   Collection Time: 07/30/22  6:31 PM   Specimen: Anterior Nasal Swab  Result Value Ref Range Status   SARS Coronavirus 2 by RT PCR NEGATIVE NEGATIVE Final    Comment: (NOTE) SARS-CoV-2 target nucleic acids are NOT DETECTED.  The SARS-CoV-2 RNA is generally detectable in upper respiratory specimens during the acute phase of infection. The lowest concentration of SARS-CoV-2 viral copies this assay can detect is 138 copies/mL. A negative result does not preclude SARS-Cov-2 infection and should not be used as the sole basis for treatment or other patient management decisions. A negative result may occur with  improper specimen collection/handling, submission of specimen other than nasopharyngeal swab, presence of viral mutation(s) within the areas targeted by this assay, and inadequate number of viral copies(<138 copies/mL). A negative result must be combined with clinical observations, patient history, and epidemiological information. The expected result is Negative.  Fact Sheet for Patients:  EntrepreneurPulse.com.au  Fact Sheet for Healthcare Providers:  IncredibleEmployment.be  This test is no t yet  approved or cleared by the [place] FDA and  has been authorized for detection and/or diagnosis of SARS-CoV-2 by FDA under an Emergency Use Authorization (EUA). This EUA will remain  in effect (meaning this test can be used) for the duration of the COVID-19 declaration under Section 564(b)(1) of the Act, 21 U.S.C.section 360bbb-3(b)(1), unless the authorization is terminated  or revoked sooner.       Influenza A by PCR NEGATIVE NEGATIVE Final   Influenza B by PCR NEGATIVE NEGATIVE Final    Comment: (NOTE) The Xpert Xpress SARS-CoV-2/FLU/RSV plus assay is intended as an aid in the diagnosis of influenza from Nasopharyngeal swab specimens and should not be used as a sole basis for treatment. Nasal washings and aspirates are unacceptable for Xpert Xpress SARS-CoV-2/FLU/RSV testing.  Fact Sheet for Patients: EntrepreneurPulse.com.au  Fact Sheet for Healthcare Providers: IncredibleEmployment.be  This test is not yet approved or cleared by the [place] FDA and has been authorized for detection and/or diagnosis of SARS-CoV-2 by FDA under an Emergency Use Authorization (EUA). This EUA will remain in effect (meaning this test can be used) for the duration of the COVID-19 declaration under Section 564(b)(1) of the Act, 21 U.S.C. section 360bbb-3(b)(1), unless the authorization is terminated or revoked.  Performed at [facility], [place] [address], [place], [place] [address]   Respiratory (~20 pathogens) panel by PCR     Status: None   Collection Time: 07/31/22 10:20 AM   Specimen: Nasopharyngeal Swab; Respiratory  Result Value Ref Range Status   Adenovirus NOT DETECTED NOT DETECTED Final   Coronavirus 229E NOT DETECTED NOT DETECTED Final    Comment: (NOTE) The Coronavirus on the Respiratory Panel, DOES NOT test for the novel  Coronavirus (2019 nCoV)    Coronavirus HKU1 NOT DETECTED NOT DETECTED  Final   Coronavirus NL63 NOT DETECTED  NOT DETECTED Final   Coronavirus OC43 NOT DETECTED NOT DETECTED Final   Metapneumovirus NOT DETECTED NOT DETECTED Final   Rhinovirus / Enterovirus NOT DETECTED NOT DETECTED Final   Influenza A NOT DETECTED NOT DETECTED Final   Influenza B NOT DETECTED NOT DETECTED Final   Parainfluenza Virus 1 NOT DETECTED NOT DETECTED Final   Parainfluenza Virus 2 NOT DETECTED NOT DETECTED Final   Parainfluenza Virus 3 NOT DETECTED NOT DETECTED Final   Parainfluenza Virus 4 NOT DETECTED NOT DETECTED Final   Respiratory Syncytial Virus NOT DETECTED NOT DETECTED Final   Bordetella pertussis NOT DETECTED NOT DETECTED Final   Bordetella Parapertussis NOT DETECTED NOT DETECTED Final   Chlamydophila pneumoniae NOT DETECTED NOT DETECTED Final   Mycoplasma pneumoniae NOT DETECTED NOT DETECTED Final    Comment: Performed at [facility], [place] [address], [place], [place] [address]  Expectorated Sputum Assessment w Gram Stain, Rflx to Resp Cult     Status: None (Preliminary result)   Collection Time: 07/31/22  2:02 PM   Specimen: Expectorated Sputum  Result Value Ref Range Status   Specimen Description EXPECTORATED SPUTUM  Final   Special Requests NONE  Final   Sputum evaluation   Final    THIS SPECIMEN IS ACCEPTABLE FOR SPUTUM CULTURE Performed at [facility], [place] [address], [place], [place] [address]    Report Status PENDING  Incomplete  Culture, Respiratory w Gram Stain     Status: None (Preliminary result)   Collection Time: 07/31/22  2:02 PM  Result Value Ref Range Status   Specimen Description EXPECTORATED SPUTUM  Final   Special Requests NONE Reflexed from [id number]  Final   Gram Stain   Final    FEW SQUAMOUS EPITHELIAL CELLS PRESENT FEW WBC PRESENT, PREDOMINANTLY MONONUCLEAR FEW GRAM POSITIVE RODS FEW GRAM NEGATIVE RODS Performed at [facility], [place] [address], [place], [place] [address]    Culture PENDING  Incomplete   Report Status PENDING  Incomplete  Aerobic/Anaerobic  Culture w Gram Stain (surgical/deep wound)     Status: None (Preliminary result)   Collection Time: 08/01/22  3:12 PM   Specimen: Bronchial Alveolar Lavage; Respiratory  Result Value Ref Range Status   Specimen Description BRONCHIAL ALVEOLAR LAVAGE  Final   Special Requests BRONCHIAL ALVEOLAR LAVAGE  Final   Gram Stain   Final    FEW WBC PRESENT, PREDOMINANTLY PMN NO ORGANISMS SEEN Performed at [facility], [address] [place], [place] [address]    Culture PENDING  Incomplete   Report Status PENDING  Incomplete    RADIOLOGY STUDIES/RESULTS: DG Chest Port 1 View  Result Date: 08/02/2022 CLINICAL DATA:  Pneumonia. EXAM: PORTABLE CHEST 1 VIEW COMPARISON:  One-view chest x-ray 07/30/2022.  CTA chest 07/30/2022. FINDINGS: Heart is enlarged. Diffuse airspace opacities are again noted, right greater than left. No significant effusions are present. IMPRESSION: Stable diffuse airspace opacities, right greater than left. This is concerning for multifocal pneumonia. Some element of edema is not excluded. Electronically Signed   By: San Morelle M.D.   On: 08/02/2022 07:32   ECHOCARDIOGRAM COMPLETE  Result Date: 07/31/2022    ECHOCARDIOGRAM REPORT   Patient Name:   DORIEN MAYOTTE Date of Exam: 07/31/2022 Medical Rec #:  [id number]      Height:       65.0 in Accession #:    [id number]     Weight:  171.5 lb Date of Birth:  08-Feb-1961      BSA:          1.853 m Patient Age:    72 years       BP:           115/62 mmHg Patient Gender: M              HR:           83 bpm. Exam Location:  Inpatient Procedure: 2D Echo, Cardiac Doppler and Color Doppler Indications:    congestive heart failure  History:        Patient has prior history of Echocardiogram examinations, most                 recent 11/24/2016. Prior CABG; Risk Factors:Hypertension, Diabetes                 and Sleep Apnea.  Sonographer:    [place] Referring Phys: [id number] [place]  1. Left  ventricular ejection fraction, by estimation, is 60 to 65%. The left ventricle has normal function. The left ventricle has no regional wall motion abnormalities. There is moderate left ventricular hypertrophy. Left ventricular diastolic parameters are indeterminate.  2. Right ventricular systolic function is normal. The right ventricular size is normal. There is mildly elevated pulmonary artery systolic pressure.  3. Left atrial size was mildly dilated.  4. Right atrial size was mildly dilated.  5. The mitral valve is normal in structure. No evidence of mitral valve regurgitation. Moderate mitral annular calcification.  6. The aortic valve is tricuspid. There is mild calcification of the aortic valve. Aortic valve regurgitation is not visualized.  7. The inferior vena cava is dilated in size with >50% respiratory variability, suggesting right atrial pressure of 8 mmHg. FINDINGS  Left Ventricle: Left ventricular ejection fraction, by estimation, is 60 to 65%. The left ventricle has normal function. The left ventricle has no regional wall motion abnormalities. The left ventricular internal cavity size was normal in size. There is  moderate left ventricular hypertrophy. Left ventricular diastolic parameters are indeterminate. Right Ventricle: The right ventricular size is normal. No increase in right ventricular wall thickness. Right ventricular systolic function is normal. There is mildly elevated pulmonary artery systolic pressure. The tricuspid regurgitant velocity is 2.90  m/s, and with an assumed right atrial pressure of 8 mmHg, the estimated right ventricular systolic pressure is 56.2 mmHg. Left Atrium: Left atrial size was mildly dilated. Right Atrium: Right atrial size was mildly dilated. Pericardium: There is no evidence of pericardial effusion. Mitral Valve: The mitral valve is normal in structure. Moderate mitral annular calcification. No evidence of mitral valve regurgitation. Tricuspid Valve: The tricuspid  valve is normal in structure. Tricuspid valve regurgitation is mild. Aortic Valve: The aortic valve is tricuspid. There is mild calcification of the aortic valve. Aortic valve regurgitation is not visualized. Pulmonic Valve: The pulmonic valve was grossly normal. Pulmonic valve regurgitation is not visualized. Aorta: The aortic root and ascending aorta are structurally normal, with no evidence of dilitation. Venous: The inferior vena cava is dilated in size with greater than 50% respiratory variability, suggesting right atrial pressure of 8 mmHg. IAS/Shunts: No atrial level shunt detected by color flow Doppler.  LEFT VENTRICLE PLAX 2D LVIDd:         4.40 cm   Diastology LVIDs:         3.40 cm   LV e' medial:  5.66 cm/s LV PW:  1.20 cm   LV e' lateral: 7.94 cm/s LV IVS:        1.20 cm LVOT diam:     2.00 cm LV SV:         59 LV SV Index:   32 LVOT Area:     3.14 cm  RIGHT VENTRICLE             IVC RV S prime:     12.50 cm/s  IVC diam: 2.30 cm TAPSE (M-mode): 2.1 cm LEFT ATRIUM             Index        RIGHT ATRIUM           Index LA diam:        4.60 cm 2.48 cm/m   RA Area:     15.60 cm LA Vol (A2C):   54.8 ml 29.57 ml/m  RA Volume:   40.30 ml  21.75 ml/m LA Vol (A4C):   62.4 ml 33.67 ml/m LA Biplane Vol: 58.5 ml 31.57 ml/m  AORTIC VALVE LVOT Vmax:   94.20 cm/s LVOT Vmean:  68.900 cm/s LVOT VTI:    0.189 m  AORTA Ao Root diam: 3.10 cm Ao Asc diam:  2.80 cm TRICUSPID VALVE TR Peak grad:   33.6 mmHg TR Vmax:        290.00 cm/s  SHUNTS Systemic VTI:  0.19 m Systemic Diam: 2.00 cm Phineas Inches Electronically signed by Phineas Inches Signature Date/Time: 07/31/2022/6:12:24 PM    Final      LOS: 3 days   Oren Binet, MD  [facility]    To contact the attending provider between 7A-7P or the covering provider during after hours 7P-7A, please log into the web site [email or web address] and access using universal [place] password for that web site. If you do not have the password, please call the  hospital operator.  08/02/2022, 10:19 AM

## 2022-08-03 ENCOUNTER — Other Ambulatory Visit (HOSPITAL_COMMUNITY): Payer: Self-pay

## 2022-08-03 ENCOUNTER — Telehealth: Payer: Self-pay | Admitting: Internal Medicine

## 2022-08-03 DIAGNOSIS — R768 Other specified abnormal immunological findings in serum: Secondary | ICD-10-CM | POA: Diagnosis not present

## 2022-08-03 DIAGNOSIS — I5020 Unspecified systolic (congestive) heart failure: Secondary | ICD-10-CM

## 2022-08-03 DIAGNOSIS — J9601 Acute respiratory failure with hypoxia: Secondary | ICD-10-CM | POA: Diagnosis not present

## 2022-08-03 DIAGNOSIS — A419 Sepsis, unspecified organism: Secondary | ICD-10-CM | POA: Diagnosis not present

## 2022-08-03 DIAGNOSIS — J189 Pneumonia, unspecified organism: Secondary | ICD-10-CM | POA: Diagnosis not present

## 2022-08-03 LAB — CBC
HCT: 36.8 % — ABNORMAL LOW (ref 39.0–52.0)
Hemoglobin: 11.5 g/dL — ABNORMAL LOW (ref 13.0–17.0)
MCH: 27.8 pg (ref 26.0–34.0)
MCHC: 31.3 g/dL (ref 30.0–36.0)
MCV: 89.1 fL (ref 80.0–100.0)
Platelets: 101 10*3/uL — ABNORMAL LOW (ref 150–400)
RBC: 4.13 MIL/uL — ABNORMAL LOW (ref 4.22–5.81)
RDW: 16.5 % — ABNORMAL HIGH (ref 11.5–15.5)
WBC: 10.1 10*3/uL (ref 4.0–10.5)
nRBC: 0 % (ref 0.0–0.2)

## 2022-08-03 LAB — ANCA PROFILE
Anti-MPO Antibodies: <0.2 units (ref 0.0–0.9)
Anti-PR3 Antibodies: <0.2 units (ref 0.0–0.9)
Atypical P-ANCA titer: <1:20 {titer}
C-ANCA: <1:20 {titer}
P-ANCA: <1:20 {titer}

## 2022-08-03 LAB — BASIC METABOLIC PANEL
Anion gap: 7 (ref 5–15)
BUN: 39 mg/dL — ABNORMAL HIGH (ref 8–23)
CO2: 27 mmol/L (ref 22–32)
Calcium: 8.6 mg/dL — ABNORMAL LOW (ref 8.9–10.3)
Chloride: 111 mmol/L (ref 98–111)
Creatinine, Ser: 1.89 mg/dL — ABNORMAL HIGH (ref 0.61–1.24)
GFR, Estimated: 40 mL/min — ABNORMAL LOW (ref >60)
Glucose, Bld: 123 mg/dL — ABNORMAL HIGH (ref 70–99)
Potassium: 4.7 mmol/L (ref 3.5–5.1)
Sodium: 145 mmol/L (ref 135–145)

## 2022-08-03 LAB — CULTURE, RESPIRATORY W GRAM STAIN: Culture: NORMAL

## 2022-08-03 LAB — C-REACTIVE PROTEIN: CRP: 11.9 mg/dL — ABNORMAL HIGH (ref <1.0)

## 2022-08-03 LAB — GLUCOSE, CAPILLARY
Glucose-Capillary: 126 mg/dL — ABNORMAL HIGH (ref 70–99)
Glucose-Capillary: 113 mg/dL — ABNORMAL HIGH (ref 70–99)
Glucose-Capillary: 87 mg/dL (ref 70–99)

## 2022-08-03 LAB — MAGNESIUM: Magnesium: 2.1 mg/dL (ref 1.7–2.4)

## 2022-08-03 MED ORDER — PREDNISONE 20 MG PO TABS
40.0000 mg | ORAL_TABLET | Freq: Every day | ORAL | 0 refills | Status: DC
  Filled 2022-08-03: qty 56, 28d supply, fill #0

## 2022-08-03 MED ORDER — PANTOPRAZOLE SODIUM 40 MG PO TBEC
40.0000 mg | DELAYED_RELEASE_TABLET | Freq: Every day | ORAL | 1 refills | Status: DC
  Filled 2022-08-03: qty 30, 30d supply, fill #0

## 2022-08-03 MED ORDER — SODIUM CHLORIDE 0.9 % IV SOLN
2.0000 g | Freq: Once | INTRAVENOUS | Status: AC
  Administered 2022-08-03: 2 g via INTRAVENOUS
  Filled 2022-08-03: qty 20

## 2022-08-03 MED ORDER — NICOTINE 21 MG/24HR TD PT24
21.0000 mg | MEDICATED_PATCH | Freq: Every day | TRANSDERMAL | 0 refills | Status: DC
  Filled 2022-08-03: qty 28, 28d supply, fill #0

## 2022-08-03 NOTE — Discharge Summary (Signed)
PATIENT DETAILS Name: [name] Age: 61 y.o. Sex: male Date of Birth: 05/02/1961 MRN: [id number]. Admitting Physician: Christel Mormon, MD EXN:[id number], Alyson Locket, NP  Admit Date: 07/30/2022 Discharge date: 08/03/2022  Recommendations for Outpatient Follow-up:  Follow up with PCP in 1-2 weeks Please obtain CMP/CBC in one week Follow BAL cultures, cytology, ANCA profile  Admitted From:  Home  Disposition: Home   Discharge Condition: fair  CODE STATUS:   Code Status: Full Code   Diet recommendation:  Diet Order             Diet - low sodium heart healthy           Diet Carb Modified           Diet Carb Modified Fluid consistency: Thin; Room service appropriate? Yes  Diet effective now                    Brief Summary: Patient is a 61 y.o.  male with history of HFrEF, PAF-s/p ablation-not on AC, CAD s/p CABG, DM-2, HTN, HLD who presented with worsening cough, weakness, shortness of breath-he was found to have PNA and subsequently admitted to the hospitalist service.  Per patient-he has had cough/cold like symptoms for approximately 4 to 6 weeks (claims he always gets URI/coughs as he picks up a virus from his small grandchildren) -he was seen by his PCP and initially given doxycycline without any improvement, he was then started on Augmentin which he apparently took for 5/7 days without any improvement.     Significant events: 10/12>> admit to [facility]-hypoxia/multifocal lung infiltrates on imaging studies. 10/14>> bronchoscopy-steroids started.   Significant studies: 10/12>> CXR: Right upper lobe PNA 10/12>> CTA chest: Multifocal infiltrates-right> left.  No PE. 10/13>> ANCA profile: Pending 10/13>> ANA: Pending 10/13>> RA factor: Pending 10/13>> CRP: 29.7   Significant microbiology data: 10/12>> HIV negative 10/12>> COVID/influenza PCR: Negative 10/12>> respiratory virus panel: Negative 10/12>> blood culture: No growth 10/13>> sputum culture: Pending 10/14>>  BAL culture: No growth 10/14>> BAL AFB smear: Pending 10/14>> BAL AFB culture: Pending 10/14>> BAL fungal culture: Pending   Pathology data 10/14>> BAL cytology: Pending   Procedures: 10/14>> bronchoscopy   Consults: PCCM  Brief Hospital Course: Severe sepsis Acute hypoxic respiratory failure Multifocal infiltrates right> left-unclear etiologies-multiple differentials including Atypical organisms causing PNA, cryptogenic organizing pneumonia, ILD and other etiologies (see work-up above).   No response to Augmentin/Doxy in the outpatient setting.  At this point-doubt infection/PNA-but covered empirically with Rocephin/Zithromax-as completed 5 days of therapy. Discussed with PCCM MD Dr. Su Hoff are to discharge home with prednisone 40 mg daily-2-week supply will be provided-patient will follow up with PCCM clinic next week-where steroid adjustment can be performed.  Qualifies for home O2-which has been ordered.  AKI Suspect hemodynamically mediated Improved than on initial presentation but seems to have plateaued Has some proteinuria-but also has a history of DM Continue to avoid nephrotoxic agents Repeat electrolytes in 1 week-if continues to have elevated creatinine-May benefit from nephrology evaluation   Thrombocytopenia Possibly from sepsis/underlying lung issues. Slowly improving-continue to follow CBC closely in the outpatient setting.  Elevated RA factor Asymptomatic-done as part of work-up for lung infiltrates We will send outpatient rheumatology referral in epic.   Hypokalemia Repleted.   Chronic HFrEF Euvolemic-no evidence of volume overload.  Not thought to have pulm edema causing lung infiltrates. Continue Entresto/beta-blocker/Farxiga Hold diuretics-allow creatinine to recover.   CAD s/p CABG No anginal symptoms Continue aspirin/statin/beta-blocker   History of  PAF-s/p ablation Maintaining sinus rhythm Follows with [facility]-cardiology-not on  anticoagulation-on aspirin. Monitor in telemetry   HLD Continue statin/Zetia   DM-2 (A1c 7.0 on 9/25) Continue insulin 70/30 50 units in a.m. and 60 units in p.m. CBG (last 3)  Recent Labs    08/02/22 2007 08/03/22 0759 08/03/22 1022  GLUCAP 196* 126* 113*     Tobacco abuse Continue transdermal nicotine Counseled   BMI: Estimated body mass index is 28.62 kg/m as calculated from the following:   Height as of this encounter: _0  (1.651 m).   Weight as of this encounter: 78 kg   Discharge Diagnoses:  Principal Problem:   Sepsis due to pneumonia [facility]) Active Problems:   Multifocal pneumonia   Acute respiratory failure with hypoxia (HCC)   Hypokalemia   Essential hypertension   Type 2 diabetes mellitus with peripheral neuropathy [facility])   Discharge Instructions:  Activity:  As tolerated  Discharge Instructions     Ambulatory referral to Rheumatology   Complete by: As directed    Call MD for:  difficulty breathing, headache or visual disturbances   Complete by: As directed    Diet - low sodium heart healthy   Complete by: As directed    Diet Carb Modified   Complete by: As directed    Discharge instructions   Complete by: As directed    Follow with Primary MD  Michela Pitcher, NP in 1-2 weeks  Continue on prednisone until seen by pulmonology clinic.  You will get a call for follow-up in 1 week.  Continue home O2 24/7 until seen by pulmonology.  Please get a complete blood count and chemistry panel checked by your Primary MD at your next visit, and again as instructed by your Primary MD.  Get Medicines reviewed and adjusted: Please take all your medications with you for your next visit with your Primary MD  Laboratory/radiological data: Please request your Primary MD to go over all hospital tests and procedure/radiological results at the follow up, please ask your Primary MD to get all Hospital records sent to his/her office.  In some cases, they will be  blood work, cultures and biopsy results pending at the time of your discharge. Please request that your primary care M.D. follows up on these results.  Also Note the following: If you experience worsening of your admission symptoms, develop shortness of breath, life threatening emergency, suicidal or homicidal thoughts you must seek medical attention immediately by calling 911 or calling your MD immediately  if symptoms less severe.  You must read complete instructions/literature along with all the possible adverse reactions/side effects for all the Medicines you take and that have been prescribed to you. Take any new Medicines after you have completely understood and accpet all the possible adverse reactions/side effects.   Do not drive when taking Pain medications or sleeping medications (Benzodaizepines)  Do not take more than prescribed Pain, Sleep and Anxiety Medications. It is not advisable to combine anxiety,sleep and pain medications without talking with your primary care practitioner  Special Instructions: If you have smoked or chewed Tobacco  in the last 2 yrs please stop smoking, stop any regular Alcohol  and or any Recreational drug use.  Wear Seat belts while driving.  Please note: You were cared for by a hospitalist during your hospital stay. Once you are discharged, your primary care physician will handle any further medical issues. Please note that NO REFILLS for any discharge medications will be authorized once you are  discharged, as it is imperative that you return to your primary care physician (or establish a relationship with a primary care physician if you do not have one) for your post hospital discharge needs so that they can reassess your need for medications and monitor your lab values.   Increase activity slowly   Complete by: As directed       Allergies as of 08/03/2022       Reactions   Latex Rash        Medication List     STOP taking these medications     amoxicillin-clavulanate 875-125 MG tablet Commonly known as: AUGMENTIN   fluorouracil 5 % cream Commonly known as: EFUDEX   furosemide 20 MG tablet Commonly known as: LASIX       TAKE these medications    acetaminophen 500 MG tablet Commonly known as: TYLENOL Take 1,000 mg by mouth every 6 (six) hours as needed for headache.   aspirin 81 MG chewable tablet Chew 1 tablet (81 mg total) by mouth daily.   atorvastatin 80 MG tablet Commonly known as: LIPITOR Take 1 tablet (80 mg total) by mouth at bedtime.   buPROPion 150 MG 24 hr tablet Commonly known as: WELLBUTRIN XL Take 150-300 mg by mouth See admin instructions. Take 2 tablets by mouth daily for 1 week, then take 1 tablet daily for 1 week   citalopram 20 MG tablet Commonly known as: CELEXA Take 1 tablet by mouth daily.   Entresto 24-26 MG Generic drug: sacubitril-valsartan Take 0.5 tablets by mouth 2 (two) times daily.   ezetimibe 10 MG tablet Commonly known as: ZETIA Take 1 tablet by mouth daily.   gabapentin 300 MG capsule Commonly known as: NEURONTIN TAKE 2 CAPSULES BY MOUTH AT BEDTIME   glucose blood test strip 1 each by Other route as needed for other. For one touch ultra meter. DX: E11.9   HumuLIN 70/30 KwikPen (70-30) 100 UNIT/ML KwikPen Generic drug: insulin isophane & regular human KwikPen Inject 50-60 Units into the skin See admin instructions. Please take 50 units every morning and 60 units with dinner   Insulin Pen Needle 32G X 4 MM Misc Use to inject Insulin daily. Dx: E11.9   INSULIN SYRINGE .3CC/31GX5/16" 31G X 5/16" 0.3 ML Misc 1 Units by Does not apply route 2 (two) times daily.   Jardiance 25 MG Tabs tablet Generic drug: empagliflozin Take 25 mg by mouth daily.   metoCLOPramide 5 MG tablet Commonly known as: Reglan Take 1 tablet (5 mg total) by mouth daily as needed for nausea.   metoprolol succinate 100 MG 24 hr tablet Commonly known as: TOPROL-XL Take 100 mg by mouth  daily.   nicotine 21 mg/24hr patch Commonly known as: NICODERM CQ - dosed in mg/24 hours Place 1 patch (21 mg total) onto the skin daily. Start taking on: August 04, 2022   nitroGLYCERIN 0.4 MG SL tablet Commonly known as: NITROSTAT Place 1 tablet (0.4 mg total) under the tongue every 5 (five) minutes as needed for chest pain.   onetouch ultrasoft lancets Use as instructed   pantoprazole 40 MG tablet Commonly known as: Protonix Take 1 tablet (40 mg total) by mouth daily.   predniSONE 20 MG tablet Commonly known as: DELTASONE Take 2 tablets (40 mg total) by mouth daily with breakfast.   sitaGLIPtin 100 MG tablet Commonly known as: JANUVIA Take 1 tablet by mouth daily.   traZODone 50 MG tablet Commonly known as: DESYREL Take 100 mg by mouth at  bedtime.               Durable Medical Equipment  (From admission, onward)           Start     Ordered   08/03/22 0655  For home use only DME oxygen  Once       Question Answer Comment  Length of Need 6 Months   Mode or (Route) Nasal cannula   Liters per Minute 3   Frequency Continuous (stationary and portable oxygen unit needed)   Oxygen conserving device Yes   Oxygen delivery system Gas      08/03/22 0654            Follow-up Information     Candee Furbish, MD Follow up.   Specialty: Pulmonary Disease Why: Office will call with date/time, If you dont hear from them,please give them a call Contact information: [place] [address] [phone]         Michela Pitcher, NP. Schedule an appointment as soon as possible for a visit in 1 week(s).   Specialties: Nurse Practitioner, Family Medicine Contact information: [place] [address] [phone]                Allergies  Allergen Reactions   Latex Rash     Other Procedures/Studies: DG Chest Port 1 View  Result Date: 08/02/2022 CLINICAL DATA:  Pneumonia. EXAM: PORTABLE CHEST 1 VIEW COMPARISON:   One-view chest x-ray 07/30/2022.  CTA chest 07/30/2022. FINDINGS: Heart is enlarged. Diffuse airspace opacities are again noted, right greater than left. No significant effusions are present. IMPRESSION: Stable diffuse airspace opacities, right greater than left. This is concerning for multifocal pneumonia. Some element of edema is not excluded. Electronically Signed   By: San Morelle M.D.   On: 08/02/2022 07:32   ECHOCARDIOGRAM COMPLETE  Result Date: 07/31/2022    ECHOCARDIOGRAM REPORT   Patient Name:   CHRISTINO MCGLINCHEY Date of Exam: 07/31/2022 Medical Rec #:  [id number]      Height:       65.0 in Accession #:    [id number]     Weight:       171.5 lb Date of Birth:  03-Jul-1961      BSA:          1.853 m Patient Age:    69 years       BP:           115/62 mmHg Patient Gender: M              HR:           83 bpm. Exam Location:  Inpatient Procedure: 2D Echo, Cardiac Doppler and Color Doppler Indications:    congestive heart failure  History:        Patient has prior history of Echocardiogram examinations, most                 recent 11/24/2016. Prior CABG; Risk Factors:Hypertension, Diabetes                 and Sleep Apnea.  Sonographer:    Beverly Referring Phys: [id number] [place]  1. Left ventricular ejection fraction, by estimation, is 60 to 65%. The left ventricle has normal function. The left ventricle has no regional wall motion abnormalities. There is moderate left ventricular hypertrophy. Left ventricular diastolic parameters are indeterminate.  2. Right ventricular systolic function is normal. The  right ventricular size is normal. There is mildly elevated pulmonary artery systolic pressure.  3. Left atrial size was mildly dilated.  4. Right atrial size was mildly dilated.  5. The mitral valve is normal in structure. No evidence of mitral valve regurgitation. Moderate mitral annular calcification.  6. The aortic valve is tricuspid. There is mild calcification of the  aortic valve. Aortic valve regurgitation is not visualized.  7. The inferior vena cava is dilated in size with >50% respiratory variability, suggesting right atrial pressure of 8 mmHg. FINDINGS  Left Ventricle: Left ventricular ejection fraction, by estimation, is 60 to 65%. The left ventricle has normal function. The left ventricle has no regional wall motion abnormalities. The left ventricular internal cavity size was normal in size. There is  moderate left ventricular hypertrophy. Left ventricular diastolic parameters are indeterminate. Right Ventricle: The right ventricular size is normal. No increase in right ventricular wall thickness. Right ventricular systolic function is normal. There is mildly elevated pulmonary artery systolic pressure. The tricuspid regurgitant velocity is 2.90  m/s, and with an assumed right atrial pressure of 8 mmHg, the estimated right ventricular systolic pressure is 70.3 mmHg. Left Atrium: Left atrial size was mildly dilated. Right Atrium: Right atrial size was mildly dilated. Pericardium: There is no evidence of pericardial effusion. Mitral Valve: The mitral valve is normal in structure. Moderate mitral annular calcification. No evidence of mitral valve regurgitation. Tricuspid Valve: The tricuspid valve is normal in structure. Tricuspid valve regurgitation is mild. Aortic Valve: The aortic valve is tricuspid. There is mild calcification of the aortic valve. Aortic valve regurgitation is not visualized. Pulmonic Valve: The pulmonic valve was grossly normal. Pulmonic valve regurgitation is not visualized. Aorta: The aortic root and ascending aorta are structurally normal, with no evidence of dilitation. Venous: The inferior vena cava is dilated in size with greater than 50% respiratory variability, suggesting right atrial pressure of 8 mmHg. IAS/Shunts: No atrial level shunt detected by color flow Doppler.  LEFT VENTRICLE PLAX 2D LVIDd:         4.40 cm   Diastology LVIDs:          3.40 cm   LV e' medial:  5.66 cm/s LV PW:         1.20 cm   LV e' lateral: 7.94 cm/s LV IVS:        1.20 cm LVOT diam:     2.00 cm LV SV:         59 LV SV Index:   32 LVOT Area:     3.14 cm  RIGHT VENTRICLE             IVC RV S prime:     12.50 cm/s  IVC diam: 2.30 cm TAPSE (M-mode): 2.1 cm LEFT ATRIUM             Index        RIGHT ATRIUM           Index LA diam:        4.60 cm 2.48 cm/m   RA Area:     15.60 cm LA Vol (A2C):   54.8 ml 29.57 ml/m  RA Volume:   40.30 ml  21.75 ml/m LA Vol (A4C):   62.4 ml 33.67 ml/m LA Biplane Vol: 58.5 ml 31.57 ml/m  AORTIC VALVE LVOT Vmax:   94.20 cm/s LVOT Vmean:  68.900 cm/s LVOT VTI:    0.189 m  AORTA Ao Root diam: 3.10 cm Ao Asc diam:  2.80  cm TRICUSPID VALVE TR Peak grad:   33.6 mmHg TR Vmax:        290.00 cm/s  SHUNTS Systemic VTI:  0.19 m Systemic Diam: 2.00 cm Phineas Inches Electronically signed by Phineas Inches Signature Date/Time: 07/31/2022/6:12:24 PM    Final    CT Angio Chest PE W and/or Wo Contrast  Result Date: 07/30/2022 CLINICAL DATA:  Pulmonary embolism suspected, high probability. Shortness of breath and cough for 6 weeks. EXAM: CT ANGIOGRAPHY CHEST WITH CONTRAST TECHNIQUE: Multidetector CT imaging of the chest was performed using the standard protocol during bolus administration of intravenous contrast. Multiplanar CT image reconstructions and MIPs were obtained to evaluate the vascular anatomy. RADIATION DOSE REDUCTION: This exam was performed according to the departmental dose-optimization program which includes automated exposure control, adjustment of the mA and/or kV according to patient size and/or use of iterative reconstruction technique. CONTRAST:  46m OMNIPAQUE IOHEXOL 350 MG/ML SOLN COMPARISON:  01/01/2022. FINDINGS: Cardiovascular: The heart is enlarged and there is no pericardial effusion. Multi-vessel coronary artery calcifications are noted. There is atherosclerotic calcification of the aorta without evidence of aneurysm. The pulmonary  trunk is normal in caliber. No pulmonary artery filling defect is identified. Mediastinum/Nodes: Mediastinal and hilar lymphadenopathy is noted bilaterally. No axillary lymphadenopathy. The thyroid gland, trachea, and esophagus are within normal limits. Lungs/Pleura: There is a small right pleural effusion. No pneumothorax. Diffuse patchy airspace disease and interlobular septal thickening are noted bilaterally, greater on the right than on the left. Upper Abdomen: No acute abnormality. Musculoskeletal: Sternotomy wires are noted. Degenerative changes are present in the thoracic spine. No acute osseous abnormality. Review of the MIP images confirms the above findings. IMPRESSION: 1. No evidence of pulmonary embolism. 2. Diffuse patchy airspace disease and interlobular septal thickening in the lungs bilaterally, possible edema versus multifocal pneumonia. 3. Small right pleural effusion. 4. Cardiomegaly with multi-vessel coronary artery calcifications. 5. Aortic atherosclerosis. Electronically Signed   By: LBrett FairyM.D.   On: 07/30/2022 20:59   DG Chest 1 View  Result Date: 07/30/2022 CLINICAL DATA:  Cough EXAM: CHEST  1 VIEW COMPARISON:  10/09/2019 FINDINGS: Postop CABG.  Cardiac enlargement. Right upper lobe airspace disease is new and appears acute. This could be infection or edema. Mild vascular congestion. No pleural effusion. IMPRESSION: Right upper lobe infiltrate may represent edema or pneumonia. Cardiac enlargement with vascular congestion. Electronically Signed   By: CFranchot GalloM.D.   On: 07/30/2022 18:17     TODAY-DAY OF DISCHARGE:  Subjective:   [name] has no headache,no chest abdominal pain,no new weakness tingling or numbness, feels much better wants to go home today.  Objective:   Blood pressure (!) 156/81, pulse 65, temperature 98.5 F (36.9 C), temperature source Oral, resp. rate (!) 21, height _0  (1.651 m), weight 78 kg, SpO2 92 %.  Intake/Output Summary  (Last 24 hours) at 08/03/2022 1046 Last data filed at 08/03/2022 0805 Gross per 24 hour  Intake 1080 ml  Output --  Net 1080 ml   Filed Weights   07/31/22 2114  Weight: 78 kg    Exam: Awake Alert, Oriented *3, No new F.N deficits, Normal affect [name],[name] Supple Neck,No JVD, No cervical lymphadenopathy appriciated.  Symmetrical Chest wall movement, Good air movement bilaterally, CTAB RRR,No Gallops,Rubs or new Murmurs, No Parasternal Heave +ve B.Sounds, Abd Soft, Non tender, No organomegaly appriciated, No rebound -guarding or rigidity. No Cyanosis, Clubbing or edema, No new Rash or bruise   PERTINENT RADIOLOGIC STUDIES: [facility]  [facility] View  Result Date: 08/02/2022 CLINICAL DATA:  Pneumonia. EXAM: PORTABLE CHEST 1 VIEW COMPARISON:  One-view chest x-ray 07/30/2022.  CTA chest 07/30/2022. FINDINGS: Heart is enlarged. Diffuse airspace opacities are again noted, right greater than left. No significant effusions are present. IMPRESSION: Stable diffuse airspace opacities, right greater than left. This is concerning for multifocal pneumonia. Some element of edema is not excluded. Electronically Signed   By: San Morelle M.D.   On: 08/02/2022 07:32     PERTINENT LAB RESULTS: CBC: Recent Labs    08/02/22 0423 08/03/22 0234  WBC 5.0 10.1  HGB 11.5* 11.5*  HCT 35.9* 36.8*  PLT 77* 101*   CMET CMP     Component Value Date/Time   NA 145 08/03/2022 0234   NA 146 03/08/2017 0000   K 4.7 08/03/2022 0234   CL 111 08/03/2022 0234   CO2 27 08/03/2022 0234   GLUCOSE 123 (H) 08/03/2022 0234   BUN 39 (H) 08/03/2022 0234   BUN 14 03/08/2017 0000   CREATININE 1.89 (H) 08/03/2022 0234   CREATININE 1.18 09/21/2016 1605   CALCIUM 8.6 (L) 08/03/2022 0234   PROT 6.0 (L) 08/01/2022 0257   ALBUMIN 2.6 (L) 08/01/2022 0257   AST 39 08/01/2022 0257   ALT 25 08/01/2022 0257   ALKPHOS 58 08/01/2022 0257   BILITOT 0.7 08/01/2022 0257   GFRNONAA 40 (L) 08/03/2022 0234   GFRAA >60  12/04/2019 1820    GFR Estimated Creatinine Clearance: 39.5 mL/min (A) (by C-G formula based on SCr of 1.89 mg/dL (H)). No results for input(s): "LIPASE", "AMYLASE" in the last 72 hours. No results for input(s): "CKTOTAL", "CKMB", "CKMBINDEX", "TROPONINI" in the last 72 hours. Invalid input(s): "POCBNP" No results for input(s): "DDIMER" in the last 72 hours. No results for input(s): "HGBA1C" in the last 72 hours. No results for input(s): "CHOL", "HDL", "LDLCALC", "TRIG", "CHOLHDL", "LDLDIRECT" in the last 72 hours. No results for input(s): "TSH", "T4TOTAL", "T3FREE", "THYROIDAB" in the last 72 hours.  Invalid input(s): "FREET3" No results for input(s): "VITAMINB12", "FOLATE", "FERRITIN", "TIBC", "IRON", "RETICCTPCT" in the last 72 hours. Coags: No results for input(s): "INR" in the last 72 hours.  Invalid input(s): "PT" Microbiology: Recent Results (from the past 240 hour(s))  Blood culture (routine x 2)     Status: None (Preliminary result)   Collection Time: 07/30/22 10:30 AM   Specimen: BLOOD RIGHT ARM  Result Value Ref Range Status   Specimen Description BLOOD RIGHT ARM  Final   Special Requests   Final    BOTTLES DRAWN AEROBIC AND ANAEROBIC Blood Culture results may not be optimal due to an inadequate volume of blood received in culture bottles   Culture   Final    NO GROWTH 3 DAYS Performed at [facility], [place] [address], [place], [place] [address]    Report Status PENDING  Incomplete  Blood culture (routine x 2)     Status: None (Preliminary result)   Collection Time: 07/30/22  6:25 PM   Specimen: BLOOD  Result Value Ref Range Status   Specimen Description BLOOD LEFT ANTECUBITAL  Final   Special Requests   Final    BOTTLES DRAWN AEROBIC AND ANAEROBIC Blood Culture adequate volume   Culture   Final    NO GROWTH 4 DAYS Performed at [facility], [place] [address], [place], [place] [address]    Report Status PENDING  Incomplete  Resp Panel by RT-PCR (Flu  A&B, Covid) Anterior Nasal Swab  Status: None   Collection Time: 07/30/22  6:31 PM   Specimen: Anterior Nasal Swab  Result Value Ref Range Status   SARS Coronavirus 2 by RT PCR NEGATIVE NEGATIVE Final    Comment: (NOTE) SARS-CoV-2 target nucleic acids are NOT DETECTED.  The SARS-CoV-2 RNA is generally detectable in upper respiratory specimens during the acute phase of infection. The lowest concentration of SARS-CoV-2 viral copies this assay can detect is 138 copies/mL. A negative result does not preclude SARS-Cov-2 infection and should not be used as the sole basis for treatment or other patient management decisions. A negative result may occur with  improper specimen collection/handling, submission of specimen other than nasopharyngeal swab, presence of viral mutation(s) within the areas targeted by this assay, and inadequate number of viral copies(<138 copies/mL). A negative result must be combined with clinical observations, patient history, and epidemiological information. The expected result is Negative.  Fact Sheet for Patients:  EntrepreneurPulse.com.au  Fact Sheet for Healthcare Providers:  IncredibleEmployment.be  This test is no t yet approved or cleared by the [place] FDA and  has been authorized for detection and/or diagnosis of SARS-CoV-2 by FDA under an Emergency Use Authorization (EUA). This EUA will remain  in effect (meaning this test can be used) for the duration of the COVID-19 declaration under Section 564(b)(1) of the Act, 21 U.S.C.section 360bbb-3(b)(1), unless the authorization is terminated  or revoked sooner.       Influenza A by PCR NEGATIVE NEGATIVE Final   Influenza B by PCR NEGATIVE NEGATIVE Final    Comment: (NOTE) The Xpert Xpress SARS-CoV-2/FLU/RSV plus assay is intended as an aid in the diagnosis of influenza from Nasopharyngeal swab specimens and should not be used as a sole basis for treatment.  Nasal washings and aspirates are unacceptable for Xpert Xpress SARS-CoV-2/FLU/RSV testing.  Fact Sheet for Patients: EntrepreneurPulse.com.au  Fact Sheet for Healthcare Providers: IncredibleEmployment.be  This test is not yet approved or cleared by the [place] FDA and has been authorized for detection and/or diagnosis of SARS-CoV-2 by FDA under an Emergency Use Authorization (EUA). This EUA will remain in effect (meaning this test can be used) for the duration of the COVID-19 declaration under Section 564(b)(1) of the Act, 21 U.S.C. section 360bbb-3(b)(1), unless the authorization is terminated or revoked.  Performed at [facility], [place] [address] [place], [place] [address]   Respiratory (~20 pathogens) panel by PCR     Status: None   Collection Time: 07/31/22 10:20 AM   Specimen: Nasopharyngeal Swab; Respiratory  Result Value Ref Range Status   Adenovirus NOT DETECTED NOT DETECTED Final   Coronavirus 229E NOT DETECTED NOT DETECTED Final    Comment: (NOTE) The Coronavirus on the Respiratory Panel, DOES NOT test for the novel  Coronavirus (2019 nCoV)    Coronavirus HKU1 NOT DETECTED NOT DETECTED Final   Coronavirus NL63 NOT DETECTED NOT DETECTED Final   Coronavirus OC43 NOT DETECTED NOT DETECTED Final   Metapneumovirus NOT DETECTED NOT DETECTED Final   Rhinovirus / Enterovirus NOT DETECTED NOT DETECTED Final   Influenza A NOT DETECTED NOT DETECTED Final   Influenza B NOT DETECTED NOT DETECTED Final   Parainfluenza Virus 1 NOT DETECTED NOT DETECTED Final   Parainfluenza Virus 2 NOT DETECTED NOT DETECTED Final   Parainfluenza Virus 3 NOT DETECTED NOT DETECTED Final   Parainfluenza Virus 4 NOT DETECTED NOT DETECTED Final   Respiratory Syncytial Virus NOT DETECTED NOT DETECTED Final   Bordetella pertussis NOT DETECTED NOT DETECTED Final  Bordetella Parapertussis NOT DETECTED NOT DETECTED Final   Chlamydophila pneumoniae NOT  DETECTED NOT DETECTED Final   Mycoplasma pneumoniae NOT DETECTED NOT DETECTED Final    Comment: Performed at [facility], [place] [address] [place], [place] [address]  Expectorated Sputum Assessment w Gram Stain, Rflx to Resp Cult     Status: None (Preliminary result)   Collection Time: 07/31/22  2:02 PM   Specimen: Expectorated Sputum  Result Value Ref Range Status   Specimen Description EXPECTORATED SPUTUM  Final   Special Requests NONE  Final   Sputum evaluation   Final    THIS SPECIMEN IS ACCEPTABLE FOR SPUTUM CULTURE Performed at [facility], [place] [address], [place], [place] [address]    Report Status PENDING  Incomplete  Culture, Respiratory w Gram Stain     Status: None   Collection Time: 07/31/22  2:02 PM  Result Value Ref Range Status   Specimen Description EXPECTORATED SPUTUM  Final   Special Requests NONE Reflexed from [id number]  Final   Gram Stain   Final    FEW SQUAMOUS EPITHELIAL CELLS PRESENT FEW WBC PRESENT, PREDOMINANTLY MONONUCLEAR FEW GRAM POSITIVE RODS FEW GRAM NEGATIVE RODS    Culture   Final    RARE Normal respiratory flora-no Staph aureus or Pseudomonas seen Performed at [facility], [place] [address], [place], [place] [address]    Report Status 08/03/2022 FINAL  Final  Aerobic/Anaerobic Culture w Gram Stain (surgical/deep wound)     Status: None (Preliminary result)   Collection Time: 08/01/22  3:12 PM   Specimen: Bronchial Alveolar Lavage; Respiratory  Result Value Ref Range Status   Specimen Description BRONCHIAL ALVEOLAR LAVAGE  Final   Special Requests BRONCHIAL ALVEOLAR LAVAGE  Final   Gram Stain   Final    FEW WBC PRESENT, PREDOMINANTLY PMN NO ORGANISMS SEEN    Culture   Final    NO GROWTH < 24 HOURS Performed at [facility], [address], [place],  [address]    Report Status PENDING  Incomplete    FURTHER DISCHARGE INSTRUCTIONS:  Get Medicines reviewed and adjusted: Please take all your medications with you  for your next visit with your Primary MD  Laboratory/radiological data: Please request your Primary MD to go over all hospital tests and procedure/radiological results at the follow up, please ask your Primary MD to get all Hospital records sent to his/her office.  In some cases, they will be blood work, cultures and biopsy results pending at the time of your discharge. Please request that your primary care M.D. goes through all the records of your hospital data and follows up on these results.  Also Note the following: If you experience worsening of your admission symptoms, develop shortness of breath, life threatening emergency, suicidal or homicidal thoughts you must seek medical attention immediately by calling 911 or calling your MD immediately  if symptoms less severe.  You must read complete instructions/literature along with all the possible adverse reactions/side effects for all the Medicines you take and that have been prescribed to you. Take any new Medicines after you have completely understood and accpet all the possible adverse reactions/side effects.   Do not drive when taking Pain medications or sleeping medications (Benzodaizepines)  Do not take more than prescribed Pain, Sleep and Anxiety Medications. It is not advisable to combine anxiety,sleep and pain medications without talking with your primary care practitioner  Special Instructions: If you have smoked or chewed Tobacco  in the last 2 yrs please stop smoking, stop any regular Alcohol  and or any Recreational drug use.  Wear Seat belts while driving.  Please note: You were cared for by a hospitalist during your hospital stay. Once you are discharged, your primary care physician will handle any further medical issues. Please note that NO REFILLS for any discharge medications will be authorized once you are discharged, as it is imperative that you return to your primary care physician (or establish a relationship with a  primary care physician if you do not have one) for your post hospital discharge needs so that they can reassess your need for medications and monitor your lab values.  Total Time spent coordinating discharge including counseling, education and face to face time equals greater than 30 minutes.  SignedOren Binet 08/03/2022 10:46 AM

## 2022-08-03 NOTE — TOC Transition Note (Signed)
Transition of Care [facility]) - CM/SW Discharge Note   Patient Details  Name: [name] MRN: [id number] Date of Birth: 12/29/1960  [facility]) CM/SW Contact:  Sharin Mons, RN Phone Number: 08/03/2022, 12:09 PM   Clinical Narrative:    Patient will DC to: home Anticipated DC date: 08/03/2022 Family notified: yes Transport by: car   Per MD patient ready for DC today. RN, patient, and patient's daughter  notified of DC. Pt states daughter will assist with care if needed once  d/c. Pt without home health needs. DME: OXYGEN , order noted.Referral made with [facility]. Equipment will be delivered to bedside prior to d/c.[facility] will deliver RX meds to bedside prior to d/c. Post hospital f/u noted on AVS.  Daughter to provide transportation to home .  RNCM will sign off for now as intervention is no longer needed. Please consult [place] again if new needs arise.    Final next level of care: Home/Self Care Barriers to Discharge: No Barriers Identified   Patient Goals and CMS Choice     Choice offered to / list presented to : Patient  Discharge Placement                       Discharge Plan and Services                DME Arranged: Oxygen DME Agency: [facility] Date DME Agency Contacted: 08/03/22 Time DME Agency Contacted: [phone] Representative spoke with at DME Agency: Jodell Cipro ( voice message left)            Social Determinants of Health (SDOH) Interventions     Readmission Risk Interventions     No data to display

## 2022-08-03 NOTE — Evaluation (Signed)
Physical Therapy Evaluation Patient Details Name: [name] MRN: [id number] DOB: 10/20/1960 Today's Date: 08/03/2022  History of Present Illness  Patient is a 61 y/o male who presents on 07/30/22 with cough, weakness, SOB and found to have PNA. PMH includes HFrEF, PAF-s/p ablation, CAD s/p CABG, DM-2, HTN, HLD, chronic tobacco use. Per chart has had cough/cold like symptoms for 4-6 weeks and given Doxycycline without improvement. s/p bronch 10/14.  Clinical Impression  Patient ambulating and transferring Mod I-independent, functioning close to baseline. Instructed pt on how to independently manage lines, 02 tubing, transferring over to tank to increase mobility and independence while in the hospital. Pt with minimal to no dyspnea during mobility noted. Able to maintain Sp02 >90% on 3L/min 02 [place]. Encouraged checking 02 with pulse ox daily at home as pt with no symptoms when 02 is low. Discussed energy conservation techniques, pursed lip breathing and safety at home and in the hospital.  Pt eager to return home. Does not require skilled therapy services. Discharge from therapy.     Recommendations for follow up therapy are one component of a multi-disciplinary discharge planning process, led by the attending physician.  Recommendations may be updated based on patient status, additional functional criteria and insurance authorization.  Follow Up Recommendations No PT follow up      Assistance Recommended at Discharge Intermittent Supervision/Assistance  Patient can return home with the following  Assist for transportation;Assistance with cooking/housework;Help with stairs or ramp for entrance    Equipment Recommendations None recommended by PT  Recommendations for Other Services       Functional Status Assessment Patient has not had a recent decline in their functional status     Precautions / Restrictions Precautions Precautions: Other (comment) Precaution Comments: watch  SpO2 Restrictions Weight Bearing Restrictions: No      Mobility  Bed Mobility Overal bed mobility: Modified Independent                  Transfers Overall transfer level: Modified independent Equipment used: None               General transfer comment: Stood from EOB x3    Ambulation/Gait Ambulation/Gait assistance: Modified independent (Device/Increase time) Gait Distance (Feet): 300 Feet Assistive device: IV Pole Gait Pattern/deviations: Step-through pattern, Decreased stride length   Gait velocity interpretation: 1.31 - 2.62 ft/sec, indicative of limited community ambulator   General Gait Details: Slow, steady gait managing own IV pole/02 tank. No evidence of imbalance. Sp02 remained >90% on 3L/min 02 [place]. mild dyspnea noted.  Stairs            Wheelchair Mobility    Modified Rankin (Stroke Patients Only)       Balance Overall balance assessment: Needs assistance   Sitting balance-Leahy Scale: Good       Standing balance-Leahy Scale: Good Standing balance comment: Able to stand up and lean on sink and donn pants without difficulty, assist ot manage lines                             Pertinent Vitals/Pain Pain Assessment Pain Assessment: No/denies pain    Home Living Family/patient expects to be discharged to:: Private residence Living Arrangements: Parent Available Help at Discharge: Family Type of Home: House Home Access: Stairs to enter Entrance Stairs-Rails: Right Entrance Stairs-Number of Steps: 3   Home Layout: One level Home Equipment: Conservation officer, nature (2 wheels);Cane - single point  Prior Function Prior Level of Function : Independent/Modified Independent             Mobility Comments: on disability, lives with mom who is independent and helps him sometimes, but she has been diagnosed with breast cancer       Hand Dominance   Dominant Hand: Right    Extremity/Trunk Assessment   Upper Extremity  Assessment Upper Extremity Assessment: Defer to OT evaluation    Lower Extremity Assessment Lower Extremity Assessment: Overall WFL for tasks assessed    Cervical / Trunk Assessment Cervical / Trunk Assessment: Normal  Communication   Communication: No difficulties  Cognition Arousal/Alertness: Awake/alert Behavior During Therapy: WFL for tasks assessed/performed Overall Cognitive Status: Within Functional Limits for tasks assessed                                          General Comments General comments (skin integrity, edema, etc.): Sp02 remained >90% on 3L/min 02 [place]. Continues to have a cough. Discussed how to be independent while in the hospital and how to manage 02 tubes/tank and transfer self over to 02 tank to improve independence.    Exercises     Assessment/Plan    PT Assessment Patient does not need any further PT services  PT Problem List         PT Treatment Interventions      PT Goals (Current goals can be found in the Care Plan section)  Acute Rehab PT Goals Patient Stated Goal: go home, get up on my own PT Goal Formulation: All assessment and education complete, DC therapy    Frequency       Co-evaluation               AM-PAC PT "6 Clicks" Mobility  Outcome Measure Help needed turning from your back to your side while in a flat bed without using bedrails?: None Help needed moving from lying on your back to sitting on the side of a flat bed without using bedrails?: None Help needed moving to and from a bed to a chair (including a wheelchair)?: None Help needed standing up from a chair using your arms (e.g., wheelchair or bedside chair)?: None Help needed to walk in hospital room?: None Help needed climbing 3-5 steps with a railing? : None 6 Click Score: 24    End of Session   Activity Tolerance: Patient tolerated treatment well Patient left: in bed;with call bell/phone within reach;Other (comment) (with OT present) Nurse  Communication: Mobility status PT Visit Diagnosis: Other abnormalities of gait and mobility (R26.89)    Time: 8882-8003 PT Time Calculation (min) (ACUTE ONLY): 27 min   Charges:   PT Evaluation $PT Eval Moderate Complexity: 1 Mod PT Treatments $Self Care/Home Management: 8-22        Zettie Cooley, DPT Acute Rehabilitation Services Secure chat preferred Office [place] 08/03/2022, 9:18 AM

## 2022-08-03 NOTE — Progress Notes (Signed)
08/03/2022 I have seen and evaluated the patient for nonresolving pneumonia.  S:  Greatly improved cough. Working with PT/OT May need some O2 for home  O: Blood pressure (!) 168/83, pulse 83, temperature 98.5 F (36.9 C), temperature source Oral, resp. rate 16, height '5\' 5"'  (1.651 m), weight 78 kg, SpO2 94 %.  No distress Less rhonci Abd soft Ext warm Moves ext to command  Normal flora on bronch Fungal/afb pending ANCA pending ANA neg RF mildly up ESR/CRP mildly up  A:  Nonresolving PNA in smoker- given rapid improvement with steroids it is some combo RBILD/DIP spectrum vs. COP  P:  - Smoking cessation counseling given: told him if he starts again this very well could start all over - Prednisone 59m/day until f/u - Okay for short course of abx on DC - IS and flutter - Home O2 eval - Will arrange f/u in clinic in 1-2 weeks to (A) see if we can get him off O2, (B) consider stopping/tapering steroids (C) check CXR, and (D) assure no smoking recurrence  DErskine EmeryMD  Pulmonary Critical Care Prefer epic messenger for cross cover needs If after hours, please call E-link

## 2022-08-03 NOTE — Evaluation (Signed)
Occupational Therapy Evaluation and discharge Patient Details Name: [name] MRN: [id number] DOB: 30-Nov-1960 Today's Date: 08/03/2022   History of Present Illness Patient is a 61 y/o male who presents on 07/30/22 with cough, weakness, SOB and found to have PNA. PMH includes HFrEF, PAF-s/p ablation, CAD s/p CABG, DM-2, HTN, HLD, chronic tobacco use. Per chart has had cough/cold like symptoms for 4-6 weeks and given Doxycycline without improvement. s/p bronch 10/14.   Clinical Impression   Pt is functioning independently. All education completed with pt verbalizing understanding. No further OT needs.      Recommendations for follow up therapy are one component of a multi-disciplinary discharge planning process, led by the attending physician.  Recommendations may be updated based on patient status, additional functional criteria and insurance authorization.   Follow Up Recommendations  No OT follow up    Assistance Recommended at Discharge PRN  Patient can return home with the following Assist for transportation    Functional Status Assessment  Patient has had a recent decline in their functional status and demonstrates the ability to make significant improvements in function in a reasonable and predictable amount of time.  Equipment Recommendations  None recommended by OT    Recommendations for Other Services       Precautions / Restrictions Precautions Precautions: Other (comment) Precaution Comments: watch SpO2 Restrictions Weight Bearing Restrictions: No      Mobility Bed Mobility               General bed mobility comments: NT    Transfers Overall transfer level: Independent Equipment used: None                      Balance     Sitting balance-Leahy Scale: Normal       Standing balance-Leahy Scale: Good                             ADL either performed or assessed with clinical judgement   ADL Overall ADL's : Independent                                        General ADL Comments: Educated in energy conservation strategies and instructed to use 02 when showering.     Vision Baseline Vision/History: 1 Wears glasses Ability to See in Adequate Light: 0 Adequate Patient Visual Report: No change from baseline       Perception     Praxis      Pertinent Vitals/Pain Pain Assessment Pain Assessment: No/denies pain     Hand Dominance Right   Extremity/Trunk Assessment Upper Extremity Assessment Upper Extremity Assessment: Overall WFL for tasks assessed   Lower Extremity Assessment Lower Extremity Assessment: Overall WFL for tasks assessed   Cervical / Trunk Assessment Cervical / Trunk Assessment: Normal   Communication Communication Communication: No difficulties   Cognition Arousal/Alertness: Awake/alert Behavior During Therapy: WFL for tasks assessed/performed Overall Cognitive Status: Within Functional Limits for tasks assessed                                 General Comments: decreased medical literacy     General Comments  Sp02 remained >90% on 3L/min 02 [place]. Continues to have a cough. Discussed how to be independent while in the hospital  and how to manage 02 tubes/tank and transfer self over to 02 tank to improve independence.    Exercises     Shoulder Instructions      Home Living Family/patient expects to be discharged to:: Private residence Living Arrangements: Parent Available Help at Discharge: Family;Available 24 hours/day Type of Home: House Home Access: Stairs to enter [employer] of Steps: 3 Entrance Stairs-Rails: Right Home Layout: One level     Bathroom Shower/Tub: Teacher, early years/pre: Standard     Home Equipment: Conservation officer, nature (2 wheels);Cane - single point          Prior Functioning/Environment Prior Level of Function : Independent/Modified Independent             Mobility Comments: on  disability, lives with mom who is independent and helps him sometimes, but she has been diagnosed with breast cancer          OT Problem List:        OT Treatment/Interventions:      OT Goals(Current goals can be found in the care plan section)    OT Frequency:      Co-evaluation              AM-PAC OT "6 Clicks" Daily Activity     Outcome Measure Help from another person eating meals?: None Help from another person taking care of personal grooming?: None Help from another person toileting, which includes using toliet, bedpan, or urinal?: None Help from another person bathing (including washing, rinsing, drying)?: None Help from another person to put on and taking off regular upper body clothing?: None Help from another person to put on and taking off regular lower body clothing?: None 6 Click Score: 24   End of Session Equipment Utilized During Treatment: Oxygen  Activity Tolerance: Patient tolerated treatment well Patient left: in bed;with call bell/phone within reach  OT Visit Diagnosis: Other (comment) (decreased activity tolerance)                Time: 1025-8527 OT Time Calculation (min): 24 min Charges:  OT General Charges $OT Visit: 1 Visit OT Evaluation $OT Eval Low Complexity: 1 Low OT Treatments $Self Care/Home Management : 8-22 mins  Cleta Alberts, OTR/L Acute Rehabilitation Services Office: [phone]   Malka So 08/03/2022, 11:02 AM

## 2022-08-03 NOTE — Progress Notes (Signed)
   08/03/22 1335  Clinical Encounter Type  Visited With Patient;Family  Visit Type Initial;Other (Comment) (Advanced Directive)  Referral From Nurse  Consult/Referral To Chaplain   Chaplain responded to a spiritual consult for advanced directive education. The patient, Random is in the process of dischange. He was open to taking the paperwork home so I went over it with him highlighting the instructions provided in the beginning of the packet. Dimitrius his daughter and I said a brief prayer as I departed leaving them to gather all his stuff.   Danice Goltz [facility]  [phone]

## 2022-08-04 ENCOUNTER — Other Ambulatory Visit: Payer: Self-pay

## 2022-08-04 ENCOUNTER — Encounter (HOSPITAL_COMMUNITY): Payer: Self-pay | Admitting: Internal Medicine

## 2022-08-04 ENCOUNTER — Telehealth: Payer: Self-pay

## 2022-08-04 DIAGNOSIS — A419 Sepsis, unspecified organism: Secondary | ICD-10-CM | POA: Diagnosis not present

## 2022-08-04 LAB — CULTURE, BLOOD (ROUTINE X 2)
Culture: NO GROWTH
Special Requests: ADEQUATE

## 2022-08-04 LAB — CYTOLOGY - NON PAP

## 2022-08-04 NOTE — Telephone Encounter (Signed)
Transition Care Management Follow-up Telephone Call Date of discharge and from where: [facility] 08/03/2022 How have you been since you were released from the hospital? tired Any questions or concerns? No  Items Reviewed: Did the pt receive and understand the discharge instructions provided? Yes  Medications obtained and verified? Yes  Other? No  Any new allergies since your discharge? No  Dietary orders reviewed? Yes Do you have support at home? Yes   Home Care and Equipment/Supplies: Were home health services ordered? no If so, what is the name of the agency? N/a   Has the agency set up a time to come to the patient's home? no Were any new equipment or medical supplies ordered?  Yes: yes What is the name of the medical supply agency?  [facility] Were you able to get the supplies/equipment? yes Do you have any questions related to the use of the equipment or supplies? No  Functional Questionnaire: (I = Independent and D = Dependent) ADLs: I  Bathing/Dressing- I  Meal Prep- I  Eating- I  Maintaining continence- I  Transferring/Ambulation- I  Managing Meds- I  Follow up appointments reviewed:  PCP Hospital f/u appt confirmed? Yes  Scheduled to see dR cABLE on 08/13/2022 @ 11:40. [facility] f/u appt confirmed? Yes  Scheduled to see Dr Tamala Julian on 08/18/2022 @ 10:35. Are transportation arrangements needed? No  If their condition worsens, is the pt aware to call PCP or go to the Emergency Dept.? Yes Was the patient provided with contact information for the PCP's office or ED? Yes Was to pt encouraged to call back with questions or concerns? Yes Juanda Crumble, LPN [place] Direct Dial [phone]

## 2022-08-05 LAB — CULTURE, BLOOD (ROUTINE X 2): Culture: NO GROWTH

## 2022-08-05 LAB — ACID FAST SMEAR (AFB, MYCOBACTERIA): Acid Fast Smear: NEGATIVE

## 2022-08-05 LAB — EXPECTORATED SPUTUM ASSESSMENT W GRAM STAIN, RFLX TO RESP C

## 2022-08-08 LAB — AEROBIC/ANAEROBIC CULTURE W GRAM STAIN (SURGICAL/DEEP WOUND)

## 2022-08-10 ENCOUNTER — Ambulatory Visit (INDEPENDENT_AMBULATORY_CARE_PROVIDER_SITE_OTHER): Payer: Medicare Other

## 2022-08-10 VITALS — Ht 65.0 in | Wt 171.0 lb

## 2022-08-10 DIAGNOSIS — Z0000 Encounter for general adult medical examination without abnormal findings: Secondary | ICD-10-CM | POA: Diagnosis not present

## 2022-08-10 NOTE — Patient Instructions (Signed)
[name] , Thank you for taking time to come for your Medicare Wellness Visit. I appreciate your ongoing commitment to your health goals. Please review the following plan we discussed and let me know if I can assist you in the future.   Screening recommendations/referrals: Colonoscopy: 02/12/22 Recommended yearly ophthalmology/optometry visit for glaucoma screening and checkup Recommended yearly dental visit for hygiene and checkup  Vaccinations: Influenza vaccine: n/d Pneumococcal vaccine: 10/29/21 Tdap vaccine: n/d Shingles vaccine: n/d   Covid-19: n/d  Advanced directives: yes  Conditions/risks identified: none  Next appointment: Follow up in one year for your annual wellness visit 08/12/23 @ 10:30 am by phone  Preventive Care 40-64 Years, Male Preventive care refers to lifestyle choices and visits with your health care provider that can promote health and wellness. What does preventive care include? A yearly physical exam. This is also called an annual well check. Dental exams once or twice a year. Routine eye exams. Ask your health care provider how often you should have your eyes checked. Personal lifestyle choices, including: Daily care of your teeth and gums. Regular physical activity. Eating a healthy diet. Avoiding tobacco and drug use. Limiting alcohol use. Practicing safe sex. Taking low-dose aspirin every day starting at age 15. What happens during an annual well check? The services and screenings done by your health care provider during your annual well check will depend on your age, overall health, lifestyle risk factors, and family history of disease. Counseling  Your health care provider may ask you questions about your: Alcohol use. Tobacco use. Drug use. Emotional well-being. Home and relationship well-being. Sexual activity. Eating habits. Work and work Statistician. Screening  You may have the following tests or measurements: Height, weight, and  BMI. Blood pressure. Lipid and cholesterol levels. These may be checked every 5 years, or more frequently if you are over 84 years old. Skin check. Lung cancer screening. You may have this screening every year starting at age 75 if you have a 30-pack-year history of smoking and currently smoke or have quit within the past 15 years. Fecal occult blood test (FOBT) of the stool. You may have this test every year starting at age 70. Flexible sigmoidoscopy or colonoscopy. You may have a sigmoidoscopy every 5 years or a colonoscopy every 10 years starting at age 63. Prostate cancer screening. Recommendations will vary depending on your family history and other risks. Hepatitis C blood test. Hepatitis B blood test. Sexually transmitted disease (STD) testing. Diabetes screening. This is done by checking your blood sugar (glucose) after you have not eaten for a while (fasting). You may have this done every 1-3 years. Discuss your test results, treatment options, and if necessary, the need for more tests with your health care provider. Vaccines  Your health care provider may recommend certain vaccines, such as: Influenza vaccine. This is recommended every year. Tetanus, diphtheria, and acellular pertussis (Tdap, Td) vaccine. You may need a Td booster every 10 years. Zoster vaccine. You may need this after age 35. Pneumococcal 13-valent conjugate (PCV13) vaccine. You may need this if you have certain conditions and have not been vaccinated. Pneumococcal polysaccharide (PPSV23) vaccine. You may need one or two doses if you smoke cigarettes or if you have certain conditions. Talk to your health care provider about which screenings and vaccines you need and how often you need them. This information is not intended to replace advice given to you by your health care provider. Make sure you discuss any questions you have with  your health care provider. Document Released: 11/01/2015 Document Revised: 06/24/2016  Document Reviewed: 08/06/2015 Elsevier Interactive Patient Education  2017 [place] Prevention in the Home Falls can cause injuries. They can happen to people of all ages. There are many things you can do to make your home safe and to help prevent falls. What can I do on the outside of my home? Regularly fix the edges of walkways and driveways and fix any cracks. Remove anything that might make you trip as you walk through a door, such as a raised step or threshold. Trim any bushes or trees on the path to your home. Use bright outdoor lighting. Clear any walking paths of anything that might make someone trip, such as rocks or tools. Regularly check to see if handrails are loose or broken. Make sure that both sides of any steps have handrails. Any raised decks and porches should have guardrails on the edges. Have any leaves, snow, or ice cleared regularly. Use sand or salt on walking paths during winter. Clean up any spills in your garage right away. This includes oil or grease spills. What can I do in the bathroom? Use night lights. Install grab bars by the toilet and in the tub and shower. Do not use towel bars as grab bars. Use non-skid mats or decals in the tub or shower. If you need to sit down in the shower, use a plastic, non-slip stool. Keep the floor dry. Clean up any water that spills on the floor as soon as it happens. Remove soap buildup in the tub or shower regularly. Attach bath mats securely with double-sided non-slip rug tape. Do not have throw rugs and other things on the floor that can make you trip. What can I do in the bedroom? Use night lights. Make sure that you have a light by your bed that is easy to reach. Do not use any sheets or blankets that are too big for your bed. They should not hang down onto the floor. Have a firm chair that has side arms. You can use this for support while you get dressed. Do not have throw rugs and other things on the floor  that can make you trip. What can I do in the kitchen? Clean up any spills right away. Avoid walking on wet floors. Keep items that you use a lot in easy-to-reach places. If you need to reach something above you, use a strong step stool that has a grab bar. Keep electrical cords out of the way. Do not use floor polish or wax that makes floors slippery. If you must use wax, use non-skid floor wax. Do not have throw rugs and other things on the floor that can make you trip. What can I do with my stairs? Do not leave any items on the stairs. Make sure that there are handrails on both sides of the stairs and use them. Fix handrails that are broken or loose. Make sure that handrails are as long as the stairways. Check any carpeting to make sure that it is firmly attached to the stairs. Fix any carpet that is loose or worn. Avoid having throw rugs at the top or bottom of the stairs. If you do have throw rugs, attach them to the floor with carpet tape. Make sure that you have a light switch at the top of the stairs and the bottom of the stairs. If you do not have them, ask someone to add them for you. What else  can I do to help prevent falls? Wear shoes that: Do not have high heels. Have rubber bottoms. Are comfortable and fit you well. Are closed at the toe. Do not wear sandals. If you use a stepladder: Make sure that it is fully opened. Do not climb a closed stepladder. Make sure that both sides of the stepladder are locked into place. Ask someone to hold it for you, if possible. Clearly mark and make sure that you can see: Any grab bars or handrails. First and last steps. Where the edge of each step is. Use tools that help you move around (mobility aids) if they are needed. These include: Canes. Walkers. Scooters. Crutches. Turn on the lights when you go into a dark area. Replace any light bulbs as soon as they burn out. Set up your furniture so you have a clear path. Avoid moving your  furniture around. If any of your floors are uneven, fix them. If there are any pets around you, be aware of where they are. Review your medicines with your doctor. Some medicines can make you feel dizzy. This can increase your chance of falling. Ask your doctor what other things that you can do to help prevent falls. This information is not intended to replace advice given to you by your health care provider. Make sure you discuss any questions you have with your health care provider. Document Released: 08/01/2009 Document Revised: 03/12/2016 Document Reviewed: 11/09/2014 Elsevier Interactive Patient Education  2017 [employer].

## 2022-08-10 NOTE — Progress Notes (Signed)
Virtual Visit via Telephone Note  I connected with  [name] on 08/10/22 at  3:15 PM EDT by telephone and verified that I am speaking with the correct person using two identifiers.  Location: Patient: home Provider: [place] Persons participating in the virtual visit: [place]   I discussed the limitations, risks, security and privacy concerns of performing an evaluation and management service by telephone and the availability of in person appointments. The patient expressed understanding and agreed to proceed.  Interactive audio and video telecommunications were attempted between this nurse and patient, however failed, due to patient having technical difficulties OR patient did not have access to video capability.  We continued and completed visit with audio only.  Some vital signs may be absent or patient reported.   Dionisio David, LPN  Subjective:   [name] is a 61 y.o. male who presents for Medicare Annual/Subsequent preventive examination.  Review of Systems     Cardiac Risk Factors include: advanced age (>4mn, >>20women);diabetes mellitus;dyslipidemia;male gender     Objective:    There were no vitals filed for this visit. There is no height or weight on file to calculate BMI.     08/10/2022    3:16 PM 07/31/2022    6:00 PM 07/31/2022    5:00 PM 02/12/2022    9:49 AM 09/10/2019    9:39 PM 08/17/2019   12:59 AM 01/23/2017    8:07 PM  Advanced Directives  Does Patient Have a Medical Advance Directive? Yes  Yes No No No Yes  Type of [place] Healthcare Power of [place] Does patient want to make changes to medical advance directive? No - Patient declined Yes (Inpatient - patient requests chaplain consult to change a medical advance directive)     No - Patient declined  Copy of [place] Chart? Yes - validated most recent  copy scanned in chart (See row information)        Would patient like information on creating a medical advance directive?    No - Patient declined  No - Patient declined     Current Medications (verified) Outpatient Encounter Medications as of 08/10/2022  Medication Sig   acetaminophen (TYLENOL) 500 MG tablet Take 1,000 mg by mouth every 6 (six) hours as needed for headache.   aspirin 81 MG chewable tablet Chew 1 tablet (81 mg total) by mouth daily.   atorvastatin (LIPITOR) 80 MG tablet Take 1 tablet (80 mg total) by mouth at bedtime.   citalopram (CELEXA) 20 MG tablet Take 1 tablet by mouth daily.   ezetimibe (ZETIA) 10 MG tablet Take 1 tablet by mouth daily.   gabapentin (NEURONTIN) 300 MG capsule TAKE 2 CAPSULES BY MOUTH AT BEDTIME (Patient taking differently: Take 600 mg by mouth at bedtime.)   glucose blood test strip 1 each by Other route as needed for other. For one touch ultra meter. DX: E11.9   insulin isophane & regular human KwikPen (HUMULIN 70/30 KWIKPEN) (70-30) 100 UNIT/ML KwikPen Inject 50-60 Units into the skin See admin instructions. Please take 50 units every morning and 60 units with dinner   Insulin Pen Needle 32G X 4 MM MISC Use to inject Insulin daily. Dx: E11.9   Insulin Syringe-Needle U-100 (INSULIN SYRINGE .3CC/31GX5/16") 31G X 5/16" 0.3 ML MISC 1 Units by Does not apply route 2 (two) times daily.   JARDIANCE  25 MG TABS tablet Take 25 mg by mouth daily.   Lancets (ONETOUCH ULTRASOFT) lancets Use as instructed   metoCLOPramide (REGLAN) 5 MG tablet Take 1 tablet (5 mg total) by mouth daily as needed for nausea.   metoprolol succinate (TOPROL-XL) 100 MG 24 hr tablet Take 100 mg by mouth daily.   mirtazapine (REMERON) 7.5 MG tablet Take by mouth.   nicotine (NICODERM CQ - DOSED IN MG/24 HOURS) 21 mg/24hr patch Place 1 patch (21 mg total) onto the skin daily.   nicotine polacrilex (COMMIT) 4 MG lozenge Place inside cheek.   nitroGLYCERIN (NITROSTAT) 0.4 MG SL tablet Place  1 tablet (0.4 mg total) under the tongue every 5 (five) minutes as needed for chest pain.   pantoprazole (PROTONIX) 40 MG tablet Take 1 tablet (40 mg total) by mouth daily.   predniSONE (DELTASONE) 20 MG tablet Take 2 tablets (40 mg total) by mouth daily with breakfast.   sacubitril-valsartan (ENTRESTO) 24-26 MG Take 0.5 tablets by mouth 2 (two) times daily.   sitaGLIPtin (JANUVIA) 100 MG tablet Take 1 tablet by mouth daily.   traZODone (DESYREL) 50 MG tablet Take 100 mg by mouth at bedtime.   buPROPion (WELLBUTRIN XL) 150 MG 24 hr tablet Take 150-300 mg by mouth See admin instructions. Take 2 tablets by mouth daily for 1 week, then take 1 tablet daily for 1 week (Patient not taking: Reported on 08/10/2022)   varenicline (CHANTIX PAK) 0.5 MG X 11 & 1 MG X 42 tablet Take 1 mg by mouth 2 (two) times daily. (Patient not taking: Reported on 08/10/2022)   No facility-administered encounter medications on file as of 08/10/2022.    Allergies (verified) Latex   History: Past Medical History:  Diagnosis Date   Abrasion L FOREARM   Atrial fibrillation ([place])    a. initially occurring in post-op setting in 05/2016, started on Eliquis b. 08/2016: s/p clipping of atrial appendage at time of CABG --> Eliquis switched to Coumadin   CAD (coronary artery disease)    a. 05/2016: NSTEMI, cath showing 3v disease with CABG recommended once MRSA infection resolved. b. 08/2016: CABG with LIMA-LAD, SVG-D1, and SVG-RI.   Cancer ([place])    basal cell carcinoma of the skin   Diabetes mellitus without complication ([place])    AYTKZSWF(093.2)    Hematuria, microscopic "SINCE I WAS A KID"   Hyperlipidemia    Hypertension    Ischemic cardiomyopathy    a. 05/2016: echo w/ EF of 25-35% and akinesis of the basal-midinferolateral and inferior myocardium.   Lactose intolerance    Myocardial infarction ([place])    Respiratory failure ([place])    Rotator cuff tear RIGHT   Tobacco use    a. quit in 05/2016 following MI   Past  Surgical History:  Procedure Laterality Date   AMPUTATION TOE Left 02/06/2020   5th ray amputation with graph [facility])   BRONCHIAL BRUSHINGS  08/01/2022   Procedure: BRONCHIAL BRUSHINGS;  Surgeon: Candee Furbish, MD;  Location: [facility] ENDOSCOPY;  Service: Pulmonary;;   BRONCHIAL WASHINGS  08/01/2022   Procedure: BRONCHIAL WASHINGS;  Surgeon: Candee Furbish, MD;  Location: [facility] ENDOSCOPY;  Service: Pulmonary;;   CARDIAC CATHETERIZATION N/A 06/16/2016   Procedure: Left Heart Cath and Coronary Angiography;  Surgeon: Troy Sine, MD;  Location: [place] CV LAB;  Service: Cardiovascular;  Laterality: N/A;   CLIPPING OF ATRIAL APPENDAGE N/A 08/24/2016   Procedure: CLIPPING OF ATRIAL APPENDAGE;  Surgeon: Ivin Poot, MD;  Location: [place];  Service: Open Heart Surgery;  Laterality: N/A;   COLONOSCOPY WITH PROPOFOL N/A 02/12/2022   Procedure: COLONOSCOPY WITH PROPOFOL;  Surgeon: Lucilla Lame, MD;  Location: [facility] ENDOSCOPY;  Service: Endoscopy;  Laterality: N/A;   CORONARY ARTERY BYPASS GRAFT N/A 08/24/2016   Procedure: CORONARY ARTERY BYPASS GRAFTING (CABG) x three,  using left internal mammary artery and right leg greater saphenous vein harvested endoscopically;  Surgeon: Ivin Poot, MD;  Location: [place];  Service: Open Heart Surgery;  Laterality: N/A;   HERNIA REPAIR  X2   INCISION AND DRAINAGE ABSCESS Left 06/10/2016   Procedure: INCISION AND DRAINAGE LEFT AXILLARY ABSCESS;  Surgeon: Autumn Messing III, MD;  Location: [facility] ORS;  Service: General;  Laterality: Left;   ROTATOR CUFF REPAIR Bilateral    TEE WITHOUT CARDIOVERSION N/A 08/24/2016   Procedure: TRANSESOPHAGEAL ECHOCARDIOGRAM (TEE);  Surgeon: Ivin Poot, MD;  Location: [place];  Service: Open Heart Surgery;  Laterality: N/A;   VIDEO BRONCHOSCOPY Right 08/01/2022   Procedure: VIDEO BRONCHOSCOPY WITH FLUORO;  Surgeon: Candee Furbish, MD;  Location: [facility] ENDOSCOPY;  Service: Pulmonary;  Laterality: Right;   Family History  Problem Relation  Age of Onset   Hypertension Mother    Hypertension Father    Sudden death Father 17       at age of death   Diabetes Father    Heart attack Father    Heart attack Sister    Cancer Maternal Aunt        breast x2 aunts   Social History   Socioeconomic History   Marital status: Married    Spouse name: Not on file   Number of children: Not on file   Years of education: Not on file   Highest education level: Not on file  Occupational History   Occupation: Hotel manager    Comment: supervisor  Tobacco Use   Smoking status: Every Day    Packs/day: 2.00    Years: 42.00    Total pack years: 84.00    Types: Cigarettes    Last attempt to quit: 09/25/2019    Years since quitting: 2.8   Smokeless tobacco: Never  Substance and Sexual Activity   Alcohol use: No   Drug use: No   Sexual activity: Not on file  Other Topics Concern   Not on file  Social History Narrative   Not on file   Social Determinants of Health   Financial Resource Strain: Low Risk  (08/10/2022)   Overall Financial Resource Strain (CARDIA)    Difficulty of Paying Living Expenses: Not hard at all  Food Insecurity: No Food Insecurity (08/10/2022)   Hunger Vital Sign    Worried About Running Out of Food in the Last Year: Never true    Ran Out of Food in the Last Year: Never true  Transportation Needs: No Transportation Needs (08/10/2022)   PRAPARE - Hydrologist (Medical): No    Lack of Transportation (Non-Medical): No  Physical Activity: Insufficiently Active (08/10/2022)   Exercise Vital Sign    Days of Exercise per Week: 2 days    Minutes of Exercise per Session: 20 min  Stress: No Stress Concern Present (08/10/2022)   [place]    Feeling of Stress : Only a little  Social Connections: Socially Isolated (08/10/2022)   Social Connection and Isolation Panel [NHANES]    Frequency of Communication with  Friends and Family: More than three times a  week    Frequency of Social Gatherings with Friends and Family: More than three times a week    Attends Religious Services: Never    Marine scientist or Organizations: No    Attends Music therapist: Never    Marital Status: Separated    Tobacco Counseling Ready to quit: Not Answered Counseling given: Not Answered   Clinical Intake:  Pre-visit preparation completed: Yes  Pain : No/denies pain     Diabetes: Yes CBG done?: No Did pt. bring in CBG monitor from home?: No  How often do you need to have someone help you when you read instructions, pamphlets, or other written materials from your doctor or pharmacy?: 1 - Never  Diabetic?yes Nutrition Risk Assessment:  Has the patient had any N/V/D within the last 2 months?  Yes  Does the patient have any non-healing wounds?  No  Has the patient had any unintentional weight loss or weight gain?  Yes   Diabetes:  Is the patient diabetic?  Yes  If diabetic, was a CBG obtained today?  No  Did the patient bring in their glucometer from home?  No  How often do you monitor your CBG's? continuous.   Financial Strains and Diabetes Management:  Are you having any financial strains with the device, your supplies or your medication? No .  Does the patient want to be seen by Chronic Care Management for management of their diabetes?  No  Would the patient like to be referred to a Nutritionist or for Diabetic Management?  No   Diabetic Exams:  Diabetic Eye Exam: Completed 07/03/22. Marland Kitchen Pt has been advised about the importance in completing this exam.   Diabetic Foot Exam: Completed 10/29/21. Pt has been advised about the importance in completing this exam.   Interpreter Needed?: No  Information entered by :: Kirke Shaggy, LPN   Activities of Daily Living    08/10/2022    3:18 PM 07/31/2022    2:00 PM  In your present state of health, do you have any difficulty  performing the following activities:  Hearing? 0 0  Vision? 0 0  Difficulty concentrating or making decisions? 0 0  Walking or climbing stairs? 1 0  Dressing or bathing? 0 0  Doing errands, shopping? 0 0  Preparing Food and eating ? N   Using the Toilet? N   In the past six months, have you accidently leaked urine? N   Do you have problems with loss of bowel control? N   Managing your Medications? N   Managing your Finances? N   Housekeeping or managing your Housekeeping? N     Patient Care Team: Michela Pitcher, NP as PCP - General  Indicate any recent Medical Services you may have received from other than [facility] providers in the past year (date may be approximate).     Assessment:   This is a routine wellness examination for Ayaansh.  Hearing/Vision screen Hearing Screening - Comments:: No aids Vision Screening - Comments:: No glasses- Dr.Shaw   Dietary issues and exercise activities discussed: Current Exercise Habits: Home exercise routine, Type of exercise: walking, Time (Minutes): 20, Frequency (Times/Week): 2, Weekly Exercise (Minutes/Week): 40, Intensity: Mild   Goals Addressed             This Visit's Progress    DIET - EAT MORE FRUITS AND VEGETABLES         Depression Screen    08/10/2022    3:14 PM 07/15/2022  11:01 AM 03/05/2022    3:54 PM 10/29/2021   10:58 AM 07/29/2021   11:50 AM 04/26/2020    9:18 AM 04/26/2020    9:17 AM  PHQ 2/9 Scores  PHQ - 2 Score '1 2 5 4 4 4   '$ PHQ- 9 Score '3 8 14 12 10 12   '$ Exception Documentation       Medical reason    Fall Risk    08/10/2022    3:17 PM 04/13/2022    9:41 AM 11/06/2019    2:46 PM 02/18/2017   11:13 AM 01/18/2017    8:48 AM  Fall Risk   Falls in the past year? 1 1 0 No No  Number falls in past yr: 1 1     Injury with Fall? 0 0     Risk for fall due to : History of fall(s) Impaired balance/gait     Follow up Falls prevention discussed;Falls evaluation completed Falls evaluation completed       FALL RISK  PREVENTION PERTAINING TO THE HOME:  Any stairs in or around the home? No  If so, are there any without handrails? No  Home free of loose throw rugs in walkways, pet beds, electrical cords, etc? Yes  Adequate lighting in your home to reduce risk of falls? Yes   ASSISTIVE DEVICES UTILIZED TO PREVENT FALLS:  Life alert? No  Use of a cane, walker or w/c? No  Grab bars in the bathroom? No  Shower chair or bench in shower? No  Elevated toilet seat or a handicapped toilet? No    Cognitive Function:        08/10/2022    3:19 PM  6CIT Screen  What Year? 0 points  What month? 0 points  What time? 0 points  Count back from 20 0 points  Months in reverse 0 points  Repeat phrase 0 points  Total Score 0 points    Immunizations Immunization History  Administered Date(s) Administered   Influenza,inj,quad, With Preservative 09/21/2019   PNEUMOCOCCAL CONJUGATE-20 10/29/2021   Pneumococcal Polysaccharide-23 09/26/2019    TDAP status: Due, Education has been provided regarding the importance of this vaccine. Advised may receive this vaccine at local pharmacy or Health Dept. Aware to provide a copy of the vaccination record if obtained from local pharmacy or Health Dept. Verbalized acceptance and understanding.  Flu Vaccine status: Due, Education has been provided regarding the importance of this vaccine. Advised may receive this vaccine at local pharmacy or Health Dept. Aware to provide a copy of the vaccination record if obtained from local pharmacy or Health Dept. Verbalized acceptance and understanding.  Pneumococcal vaccine status: Up to date  Covid-19 vaccine status: Declined, Education has been provided regarding the importance of this vaccine but patient still declined. Advised may receive this vaccine at local pharmacy or Health Dept.or vaccine clinic. Aware to provide a copy of the vaccination record if obtained from local pharmacy or Health Dept. Verbalized acceptance and  understanding.  Qualifies for Shingles Vaccine? Yes   Zostavax completed No   Shingrix Completed?: No.    Education has been provided regarding the importance of this vaccine. Patient has been advised to call insurance company to determine out of pocket expense if they have not yet received this vaccine. Advised may also receive vaccine at local pharmacy or Health Dept. Verbalized acceptance and understanding.  Screening Tests Health Maintenance  Topic Date Due   Hepatitis C Screening  Never done   TETANUS/TDAP  10/29/2022 (Originally  06/06/1980)   INFLUENZA VACCINE  01/17/2023 (Originally 05/19/2022)   Zoster Vaccines- Shingrix (1 of 2) 01/26/2023 (Originally 06/06/1980)   FOOT EXAM  10/29/2022   Diabetic kidney evaluation - Urine ACR  11/25/2022   HEMOGLOBIN A1C  01/11/2023   COLONOSCOPY (Pts 45-92yr Insurance coverage will need to be confirmed)  02/13/2023   OPHTHALMOLOGY EXAM  07/04/2023   Diabetic kidney evaluation - GFR measurement  08/04/2023   HIV Screening  Completed   HPV VACCINES  Aged Out   COVID-19 Vaccine  Discontinued    Health Maintenance  Health Maintenance Due  Topic Date Due   Hepatitis C Screening  Never done    Colorectal cancer screening: Type of screening: Colonoscopy. Completed 02/12/22. Repeat every 1 years  Lung Cancer Screening: (Low Dose CT Chest recommended if Age 61-80years, 30 pack-year currently smoking OR have quit w/in 15years.) does qualify.   Lung Cancer Screening Referral: has had lung exam  Additional Screening:  Hepatitis C Screening: does qualify; Completed no  Vision Screening: Recommended annual ophthalmology exams for early detection of glaucoma and other disorders of the eye. Is the patient up to date with their annual eye exam?  Yes  Who is the provider or what is the name of the office in which the patient attends annual eye exams? [facility] If pt is not established with a provider, would they like to be referred to a provider to  establish care? No .   Dental Screening: Recommended annual dental exams for proper oral hygiene  Community Resource Referral / Chronic Care Management: CRR required this visit?  No   CCM required this visit?  No      Plan:     I have personally reviewed and noted the following in the patient's chart:   Medical and social history Use of alcohol, tobacco or illicit drugs  Current medications and supplements including opioid prescriptions. Patient is not currently taking opioid prescriptions. Functional ability and status Nutritional status Physical activity Advanced directives List of other physicians Hospitalizations, surgeries, and ER visits in previous 12 months Vitals Screenings to include cognitive, depression, and falls Referrals and appointments  In addition, I have reviewed and discussed with patient certain preventive protocols, quality metrics, and best practice recommendations. A written personalized care plan for preventive services as well as general preventive health recommendations were provided to patient.     LDionisio David LPN   [id number]  Nurse Notes: none

## 2022-08-13 ENCOUNTER — Encounter: Payer: Self-pay | Admitting: Nurse Practitioner

## 2022-08-13 ENCOUNTER — Telehealth: Payer: Self-pay

## 2022-08-13 ENCOUNTER — Ambulatory Visit (INDEPENDENT_AMBULATORY_CARE_PROVIDER_SITE_OTHER): Payer: Medicare Other | Admitting: Nurse Practitioner

## 2022-08-13 VITALS — BP 134/64 | HR 77 | Temp 97.3°F | Resp 14 | Ht 65.0 in | Wt 174.2 lb

## 2022-08-13 DIAGNOSIS — I5020 Unspecified systolic (congestive) heart failure: Secondary | ICD-10-CM | POA: Diagnosis not present

## 2022-08-13 DIAGNOSIS — Z09 Encounter for follow-up examination after completed treatment for conditions other than malignant neoplasm: Secondary | ICD-10-CM | POA: Insufficient documentation

## 2022-08-13 DIAGNOSIS — J439 Emphysema, unspecified: Secondary | ICD-10-CM | POA: Diagnosis not present

## 2022-08-13 DIAGNOSIS — E1142 Type 2 diabetes mellitus with diabetic polyneuropathy: Secondary | ICD-10-CM | POA: Diagnosis not present

## 2022-08-13 DIAGNOSIS — J189 Pneumonia, unspecified organism: Secondary | ICD-10-CM | POA: Diagnosis not present

## 2022-08-13 DIAGNOSIS — A419 Sepsis, unspecified organism: Secondary | ICD-10-CM

## 2022-08-13 LAB — CBC
HCT: 46.6 % (ref 39.0–52.0)
Hemoglobin: 14.4 g/dL (ref 13.0–17.0)
MCHC: 30.9 g/dL (ref 30.0–36.0)
MCV: 88.2 fl (ref 78.0–100.0)
Platelets: 252 10*3/uL (ref 150.0–400.0)
RBC: 5.28 Mil/uL (ref 4.22–5.81)
RDW: 17.3 % — ABNORMAL HIGH (ref 11.5–15.5)
WBC: 22.1 10*3/uL (ref 4.0–10.5)

## 2022-08-13 LAB — COMPREHENSIVE METABOLIC PANEL
ALT: 56 U/L — ABNORMAL HIGH (ref 0–53)
AST: 28 U/L (ref 0–37)
Albumin: 4 g/dL (ref 3.5–5.2)
Alkaline Phosphatase: 84 U/L (ref 39–117)
BUN: 30 mg/dL — ABNORMAL HIGH (ref 6–23)
CO2: 32 mEq/L (ref 19–32)
Calcium: 8.7 mg/dL (ref 8.4–10.5)
Chloride: 109 mEq/L (ref 96–112)
Creatinine, Ser: 1.52 mg/dL — ABNORMAL HIGH (ref 0.40–1.50)
GFR: 49.26 mL/min — ABNORMAL LOW (ref >60.00)
Glucose, Bld: 115 mg/dL — ABNORMAL HIGH (ref 70–99)
Potassium: 4.5 mEq/L (ref 3.5–5.1)
Sodium: 148 mEq/L — ABNORMAL HIGH (ref 135–145)
Total Bilirubin: 0.4 mg/dL (ref 0.2–1.2)
Total Protein: 6.5 g/dL (ref 6.0–8.3)

## 2022-08-13 LAB — BRAIN NATRIURETIC PEPTIDE: Pro B Natriuretic peptide (BNP): 309 pg/mL — ABNORMAL HIGH (ref 0.0–100.0)

## 2022-08-13 NOTE — Patient Instructions (Signed)
Nice to see you today I want to see you in 2 months, sooner if you need  We will recheck the chest xray on your return.

## 2022-08-13 NOTE — Assessment & Plan Note (Signed)
BNP elevated in the hospital.  Patient did have AKI.  Has had a 3 pound increase we will check BMP today no lower extremity edema appreciated.

## 2022-08-13 NOTE — Telephone Encounter (Signed)
Call from [facility], giving critical lab results.  States pt's WBC is 22.1.

## 2022-08-13 NOTE — Assessment & Plan Note (Signed)
Patient still experiencing some dyspnea at rest and on exertion.  Vital signs within normal limits today.  Patient does wear nocturnal oxygen.  Has an appointment with pulmonology coming up.

## 2022-08-13 NOTE — Assessment & Plan Note (Signed)
Reviewed attending physician's note, labs, and imaging.

## 2022-08-13 NOTE — Assessment & Plan Note (Signed)
Followed by endocrine.  States sugars have been hanging around 200.  He was on the prednisone medication.

## 2022-08-13 NOTE — Progress Notes (Signed)
Established Patient Office Visit  Subjective   Patient ID: [name], male    DOB: [birth year]  Age: 61 y.o. MRN: [id number]  Chief Complaint  Patient presents with   Hospitalization Follow-up    HPI  Hospital follow up: patient was seen in the ED on 07/30/2022. Dx with sepsis due to pneumonia   States that he was discharged on 08/03/2022. States that he has had good days and bad days. He was sent in home oxygen. He is managing 3 liters at night   States that he will experience shob with rest and exertion. States that his    Review of Systems  Constitutional:  Negative for chills and fever.  Respiratory:  Positive for cough, sputum production and shortness of breath.   Cardiovascular:  Negative for chest pain.  Neurological:  Positive for headaches. Negative for dizziness.      Objective:     BP 134/64   Pulse 77   Temp (!) 97.3 F (36.3 C) (Temporal)   Resp 14   Ht '5\' 5"'$  (1.651 m)   Wt 174 lb 4 oz (79 kg)   SpO2 96%   BMI 29.00 kg/m  BP Readings from Last 3 Encounters:  08/13/22 134/64  08/03/22 (!) 168/83  07/15/22 134/62   Wt Readings from Last 3 Encounters:  08/13/22 174 lb 4 oz (79 kg)  08/10/22 171 lb (77.6 kg)  07/31/22 171 lb 15.3 oz (78 kg)      Physical Exam Vitals and nursing note reviewed.  Constitutional:      Appearance: Normal appearance.  Cardiovascular:     Rate and Rhythm: Normal rate and regular rhythm.     Heart sounds: Normal heart sounds.  Pulmonary:     Effort: Pulmonary effort is normal.     Comments: Diminished bilaterally  Abdominal:     General: Bowel sounds are normal.  Musculoskeletal:     Right lower leg: No edema.     Left lower leg: No edema.  Neurological:     Mental Status: He is alert.      No results found for any visits on 08/13/22.    The ASCVD Risk score (Arnett DK, et al., 2019) failed to calculate for the following reasons:   The patient has a prior MI or stroke diagnosis    Assessment &  Plan:   Problem List Items Addressed This Visit       Cardiovascular and Mediastinum   HFrEF (heart failure with reduced ejection fraction) (HCC)    BNP elevated in the hospital.  Patient did have AKI.  Has had a 3 pound increase we will check BMP today no lower extremity edema appreciated.      Relevant Orders   Brain natriuretic peptide     Respiratory   Sepsis due to pneumonia [facility])    Hospital follow-up for sepsis from pneumonia.  Unsure if it was a multifocal pneumonia versus inflammatory reaction or fluid overload.  Patient had chest x-ray and CT angio.  Reviewed      Relevant Orders   CBC   Comprehensive metabolic panel   Brain natriuretic peptide   Pulmonary emphysema ([place])    Patient still experiencing some dyspnea at rest and on exertion.  Vital signs within normal limits today.  Patient does wear nocturnal oxygen.  Has an appointment with pulmonology coming up.        Endocrine   Type 2 diabetes mellitus with peripheral neuropathy (HCC)    Followed  by endocrine.  States sugars have been hanging around 200.  He was on the prednisone medication.        Other   Hospital discharge follow-up - Primary    Reviewed attending physician's note, labs, and imaging.       Return in about 2 months (around 10/13/2022) for PNA recheck.    Romilda Garret, NP

## 2022-08-13 NOTE — Assessment & Plan Note (Signed)
Hospital follow-up for sepsis from pneumonia.  Unsure if it was a multifocal pneumonia versus inflammatory reaction or fluid overload.  Patient had chest x-ray and CT angio.  Reviewed

## 2022-08-13 NOTE — Telephone Encounter (Signed)
Noted. Will await the rest of the blood test. Patient has been on a long course of steroids per pulmonology. Likely secondary to that

## 2022-08-14 DIAGNOSIS — E119 Type 2 diabetes mellitus without complications: Secondary | ICD-10-CM | POA: Diagnosis not present

## 2022-08-17 ENCOUNTER — Encounter (INDEPENDENT_AMBULATORY_CARE_PROVIDER_SITE_OTHER): Payer: Self-pay

## 2022-08-18 ENCOUNTER — Ambulatory Visit (INDEPENDENT_AMBULATORY_CARE_PROVIDER_SITE_OTHER): Payer: Medicare Other | Admitting: Adult Health

## 2022-08-18 ENCOUNTER — Encounter: Payer: Self-pay | Admitting: Adult Health

## 2022-08-18 ENCOUNTER — Ambulatory Visit (INDEPENDENT_AMBULATORY_CARE_PROVIDER_SITE_OTHER): Payer: Medicare Other

## 2022-08-18 ENCOUNTER — Telehealth: Payer: Self-pay | Admitting: *Deleted

## 2022-08-18 VITALS — BP 108/60 | HR 74 | Temp 98.1°F | Ht 66.0 in | Wt 174.6 lb

## 2022-08-18 DIAGNOSIS — J189 Pneumonia, unspecified organism: Secondary | ICD-10-CM

## 2022-08-18 DIAGNOSIS — J441 Chronic obstructive pulmonary disease with (acute) exacerbation: Secondary | ICD-10-CM | POA: Insufficient documentation

## 2022-08-18 DIAGNOSIS — J449 Chronic obstructive pulmonary disease, unspecified: Secondary | ICD-10-CM

## 2022-08-18 DIAGNOSIS — J9611 Chronic respiratory failure with hypoxia: Secondary | ICD-10-CM | POA: Diagnosis not present

## 2022-08-18 DIAGNOSIS — J8489 Other specified interstitial pulmonary diseases: Secondary | ICD-10-CM | POA: Diagnosis not present

## 2022-08-18 DIAGNOSIS — Z720 Tobacco use: Secondary | ICD-10-CM | POA: Diagnosis not present

## 2022-08-18 DIAGNOSIS — G4733 Obstructive sleep apnea (adult) (pediatric): Secondary | ICD-10-CM | POA: Diagnosis not present

## 2022-08-18 MED ORDER — STIOLTO RESPIMAT 2.5-2.5 MCG/ACT IN AERS: 2.0000 | INHALATION_SPRAY | Freq: Every day | RESPIRATORY_TRACT | 0 refills | Status: DC

## 2022-08-18 MED ORDER — PREDNISONE 10 MG PO TABS: ORAL_TABLET | ORAL | 0 refills | Status: DC

## 2022-08-18 NOTE — Assessment & Plan Note (Signed)
Presumed COPD-heavy smoking history.  Smoking cessation is discussed in detail.  Patient will begin Stiolto 2 puffs daily.  Check PFTs on return  Plan  Patient Instructions  Decrease prednisone 30 mg daily for 4 days then 20 mg daily for 4 days then 10 mg daily for 4 days and stop. Work on not smoking Begin Stiolto 2 puffs daily.  Activity as tolerated Wear CPAP At bedtime .  CPAP download  Follow up in 4-6 weeks with Dr. Ander Slade or Antanisha Mohs NP with PFT and As needed   Please contact office for sooner follow up if symptoms do not improve or worsen or seek emergency care

## 2022-08-18 NOTE — Assessment & Plan Note (Signed)
Smoking cessation discussed 

## 2022-08-18 NOTE — Patient Instructions (Addendum)
Decrease prednisone 30 mg daily for 4 days then 20 mg daily for 4 days then 10 mg daily for 4 days and stop. Work on not smoking Begin Stiolto 2 puffs daily.  Activity as tolerated Wear CPAP At bedtime .  CPAP download  Follow up in 4-6 weeks with Dr. Ander Slade or Yousif Edelson NP with PFT and As needed   Please contact office for sooner follow up if symptoms do not improve or worsen or seek emergency care

## 2022-08-18 NOTE — Assessment & Plan Note (Signed)
Right greater than left pulmonary infiltrates suspicious for multifocal pneumonia versus organizing pneumonia.  -Patient has been treated with empiric antibiotics and empiric steroids.  -Clinically has improved, chest x-ray shows significant improvement with resolution of bilateral infiltrates. Patient will taper off of prednisone over the next 2 weeks.  Plan  Patient Instructions  Decrease prednisone 30 mg daily for 4 days then 20 mg daily for 4 days then 10 mg daily for 4 days and stop. Work on not smoking Begin Stiolto 2 puffs daily.  Activity as tolerated Wear CPAP At bedtime .  CPAP download  Follow up in 4-6 weeks with Dr. Ander Slade or Riyan Haile NP with PFT and As needed   Please contact office for sooner follow up if symptoms do not improve or worsen or seek emergency care       '

## 2022-08-18 NOTE — Assessment & Plan Note (Signed)
Obstructive sleep apnea.  Patient has a history of obstructive sleep apnea.  We will request his previous sleep study and get a CPAP download.  Patient is to continue on CPAP at bedtime.  Plan  Patient Instructions  Decrease prednisone 30 mg daily for 4 days then 20 mg daily for 4 days then 10 mg daily for 4 days and stop. Work on not smoking Begin Stiolto 2 puffs daily.  Activity as tolerated Wear CPAP At bedtime .  CPAP download  Follow up in 4-6 weeks with Dr. Ander Slade or Pualani Borah NP with PFT and As needed   Please contact office for sooner follow up if symptoms do not improve or worsen or seek emergency care

## 2022-08-18 NOTE — Progress Notes (Signed)
_0  ID: [name], male    DOB: 24-Jan-1961, 61 y.o.   MRN: [id number]  Chief Complaint  Patient presents with   Follow-up    Referring provider: Michela Pitcher, NP  HPI: 61 year old male active smoker seen for pulmonary consult during hospitalization for acute respiratory failure and presumed pneumonia/organizing pneumonia with abnormal CT chest with bilateral infiltrates Medical history significant for heart failure, coronary disease status post CABG, diabetes, A-fib  TEST/EVENTS :  10/12>> CTA chest: Multifocal infiltrates-right> left.  No PE. 10/13>> ANCA profile: Neg  10/13>> ANA: Neg  10/13>> RA factor: 16.2  10/13>> CRP: 29.7, ESR 57    Significant microbiology data: 10/12>> HIV negative 10/12>> COVID/influenza PCR: Negative 10/12>> respiratory virus panel: Negative 10/12>> blood culture: No growth 10/13>> sputum culture: neg  10/14>> BAL culture: No growth 10/14>> BAL AFB smear: Neg  10/14>> BAL AFB culture: Pending 10/14>> BAL fungal culture: Neg    Pathology data 10/14>> BAL cytology: Negative malignant cells, pulmonary macrophages and mixed inflammatory cells with scattered benign bronchial cells and squamous metaplasia, atypical bronchial cells favor reactive  2D echo EF at 60 to 65%, moderate LVH, mildly elevated pulmonary artery systolic pressure  64/15/8309 Post hospital follow up  Patient returns for a posthospital follow-up.  Patient was recently hospitalized for presumed multifocal pneumonia versus cryptogenic organizing pneumonia and acute hypoxic respiratory failure.  Patient presented with few week history of cough congestion and progressive weakness.  He was initially treated in the outpatient setting with oral antibiotics without significant improvement.  He was started on IV antibiotics.  CT chest showed multifocal infiltrates right greater than left.  Negative for PE.  Autoimmune/connective tissue work-up was unrevealing as above.  Rheumatoid  factor was just slightly elevated at 16.2.  His CRP/ESR  was elevated.  HIV was negative.  Viral panel was negative.  He underwent bronchoscopy on October 14. Cytology was negative for malignant cells.  Did show pulmonary macrophages and mixed inflammatory cells with scattered benign bronchial cells and squamous metaplasia.  Atypical bronchial cells favor reactive.  BAL culture was negative.  BAL AFB smear negative.  BAL fungal culture was negative. He was started on empiric steroids for possible COP.   Since discharge patient is feeling he has weaned off of oxygen during the daytime.  Is only using oxygen 2 L at nighttime. Patient has presumed COPD , he is a current smoker and has a heavy smoking history.    He is not on any inhalers does get short of breath with prolonged walking and heavy activities Chest x-ray today is significantly improved with interval clearing of bilateral infiltrates. Patient says he does have sleep apnea and has a CPAP machine.  He does not wear it all the time but has recently restarted wearing it.    Allergies  Allergen Reactions   Latex Rash    Immunization History  Administered Date(s) Administered   Influenza,inj,quad, With Preservative 09/21/2019   PNEUMOCOCCAL CONJUGATE-20 10/29/2021   Pneumococcal Polysaccharide-23 09/26/2019    Past Medical History:  Diagnosis Date   Abrasion L FOREARM   Atrial fibrillation ([place])    a. initially occurring in post-op setting in 05/2016, started on Eliquis b. 08/2016: s/p clipping of atrial appendage at time of CABG --> Eliquis switched to Coumadin   CAD (coronary artery disease)    a. 05/2016: NSTEMI, cath showing 3v disease with CABG recommended once MRSA infection resolved. b. 08/2016: CABG with LIMA-LAD, SVG-D1, and SVG-RI.   Cancer ([place])  basal cell carcinoma of the skin   Diabetes mellitus without complication (HCC)    VFIEPPIR(518.8)    Hematuria, microscopic "SINCE I WAS A KID"   Hyperlipidemia     Hypertension    Ischemic cardiomyopathy    a. 05/2016: echo w/ EF of 25-35% and akinesis of the basal-midinferolateral and inferior myocardium.   Lactose intolerance    Myocardial infarction (HCC)    Respiratory failure (HCC)    Rotator cuff tear RIGHT   Tobacco use    a. quit in 05/2016 following MI    Tobacco History: Social History   Tobacco Use  Smoking Status Some Days   Packs/day: 2.00   Years: 42.00   Total pack years: 84.00   Types: Cigarettes  Smokeless Tobacco Never  Tobacco Comments   Smoked 12 cigarettes in 12 days.  Trying to quit.  08/18/2022 hfb   Ready to quit: Not Answered Counseling given: Not Answered Tobacco comments: Smoked 12 cigarettes in 12 days.  Trying to quit.  08/18/2022 hfb   Outpatient Medications Prior to Visit  Medication Sig Dispense Refill   acetaminophen (TYLENOL) 500 MG tablet Take 1,000 mg by mouth every 6 (six) hours as needed for headache.     aspirin 81 MG chewable tablet Chew 1 tablet (81 mg total) by mouth daily. 30 tablet 3   atorvastatin (LIPITOR) 80 MG tablet Take 1 tablet (80 mg total) by mouth at bedtime. 30 tablet 11   ezetimibe (ZETIA) 10 MG tablet Take 1 tablet by mouth daily.     gabapentin (NEURONTIN) 300 MG capsule TAKE 2 CAPSULES BY MOUTH AT BEDTIME (Patient taking differently: Take 600 mg by mouth at bedtime.) 60 capsule 5   glucose blood test strip 1 each by Other route as needed for other. For one touch ultra meter. DX: E11.9 100 each 3   insulin isophane & regular human KwikPen (HUMULIN 70/30 KWIKPEN) (70-30) 100 UNIT/ML KwikPen Inject 50-60 Units into the skin See admin instructions. Please take 50 units every morning and 60 units with dinner     Insulin Pen Needle 32G X 4 MM MISC Use to inject Insulin daily. Dx: E11.9 100 each 3   Insulin Syringe-Needle U-100 (INSULIN SYRINGE .3CC/31GX5/16") 31G X 5/16" 0.3 ML MISC 1 Units by Does not apply route 2 (two) times daily. 100 each 2   JARDIANCE 25 MG TABS tablet Take 25 mg  by mouth daily.     Lancets (ONETOUCH ULTRASOFT) lancets Use as instructed 100 each 12   metoCLOPramide (REGLAN) 5 MG tablet Take 1 tablet (5 mg total) by mouth daily as needed for nausea. 30 tablet 2   metoprolol succinate (TOPROL-XL) 100 MG 24 hr tablet Take 100 mg by mouth daily.     mirtazapine (REMERON) 7.5 MG tablet Take by mouth.     pantoprazole (PROTONIX) 40 MG tablet Take 1 tablet (40 mg total) by mouth daily. 30 tablet 1   sacubitril-valsartan (ENTRESTO) 24-26 MG Take 0.5 tablets by mouth 2 (two) times daily.     sitaGLIPtin (JANUVIA) 100 MG tablet Take 1 tablet by mouth daily.     traZODone (DESYREL) 50 MG tablet Take 100 mg by mouth at bedtime.     predniSONE (DELTASONE) 20 MG tablet Take 2 tablets (40 mg total) by mouth daily with breakfast. 56 tablet 0   nicotine (NICODERM CQ - DOSED IN MG/24 HOURS) 21 mg/24hr patch Place 1 patch (21 mg total) onto the skin daily. (Patient not taking: Reported on 08/18/2022) 28  patch 0   nitroGLYCERIN (NITROSTAT) 0.4 MG SL tablet Place 1 tablet (0.4 mg total) under the tongue every 5 (five) minutes as needed for chest pain. (Patient not taking: Reported on 08/18/2022) 25 tablet 3   nicotine polacrilex (COMMIT) 4 MG lozenge Place inside cheek. (Patient not taking: Reported on 08/18/2022)     No facility-administered medications prior to visit.     Review of Systems:   Constitutional:   No  weight loss, night sweats,  Fevers, chills,  +fatigue, or  lassitude.  HEENT:   No headaches,  Difficulty swallowing,  Tooth/dental problems, or  Sore throat,                No sneezing, itching, ear ache, nasal congestion, post nasal drip,   CV:  No chest pain,  Orthopnea, PND, swelling in lower extremities, anasarca, dizziness, palpitations, syncope.   GI  No heartburn, indigestion, abdominal pain, nausea, vomiting, diarrhea, change in bowel habits, loss of appetite, bloody stools.   Resp:   No chest wall deformity  Skin: no rash or lesions.  GU:  no dysuria, change in color of urine, no urgency or frequency.  No flank pain, no hematuria   MS:  No joint pain or swelling.  No decreased range of motion.  No back pain.    Physical Exam  BP 108/60 (BP Location: Left Arm, Patient Position: Sitting, Cuff Size: Large)   Pulse 74   Temp 98.1 F (36.7 C) (Oral)   Ht _0  (1.676 m)   Wt 174 lb 9.6 oz (79.2 kg)   SpO2 99%   BMI 28.18 kg/m   GEN: A/Ox3; pleasant , NAD, well nourished    HEENT:  [place]/AT,  EACs-clear, TMs-wnl, NOSE-clear, THROAT-clear, no lesions, no postnasal drip or exudate noted.   NECK:  Supple w/ fair ROM; no JVD; normal carotid impulses w/o bruits; no thyromegaly or nodules palpated; no lymphadenopathy.    RESP  Clear  P & A; w/o, wheezes/ rales/ or rhonchi. no accessory muscle use, no dullness to percussion  CARD:  RRR, no m/r/g, no peripheral edema, pulses intact, no cyanosis or clubbing.  GI:   Soft & nt; nml bowel sounds; no organomegaly or masses detected.   Musco: Warm bil, no deformities or joint swelling noted.   Neuro: alert, no focal deficits noted.    Skin: Warm, no lesions or rashes    Lab Results:    BMET     Imaging: DG Chest 2 View  Result Date: 08/18/2022 CLINICAL DATA:  Pneumonia EXAM: CHEST - 2 VIEW COMPARISON:  Previous studies including the examination of 08/02/2022 FINDINGS: Transverse diameter of heart is increased. There is previous coronary bypass surgery. There is a metallic clamp in the region of left atrial appendage. There are no signs of pulmonary edema or focal pulmonary consolidation. There is interval clearing of infiltrates in both lungs. There is no pleural effusion or pneumothorax. IMPRESSION: Cardiomegaly. There are no signs of pulmonary edema or focal pulmonary consolidation. Electronically Signed   By: Elmer Picker M.D.   On: 08/18/2022 11:10   DG Chest Port 1 View  Result Date: 08/02/2022 CLINICAL DATA:  Pneumonia. EXAM: PORTABLE CHEST 1 VIEW  COMPARISON:  One-view chest x-ray 07/30/2022.  CTA chest 07/30/2022. FINDINGS: Heart is enlarged. Diffuse airspace opacities are again noted, right greater than left. No significant effusions are present. IMPRESSION: Stable diffuse airspace opacities, right greater than left. This is concerning for multifocal pneumonia. Some element of edema is not  excluded. Electronically Signed   By: San Morelle M.D.   On: 08/02/2022 07:32   ECHOCARDIOGRAM COMPLETE  Result Date: 07/31/2022    ECHOCARDIOGRAM REPORT   Patient Name:   [name] Date of Exam: 07/31/2022 Medical Rec #:  [id number]      Height:       65.0 in Accession #:    [id number]     Weight:       171.5 lb Date of Birth:  Oct 19, 1961      BSA:          1.853 m Patient Age:    29 years       BP:           115/62 mmHg Patient Gender: M              HR:           83 bpm. Exam Location:  Inpatient Procedure: 2D Echo, Cardiac Doppler and Color Doppler Indications:    congestive heart failure  History:        Patient has prior history of Echocardiogram examinations, most                 recent 11/24/2016. Prior CABG; Risk Factors:Hypertension, Diabetes                 and Sleep Apnea.  Sonographer:    [place] Referring Phys: [id number] [place]  1. Left ventricular ejection fraction, by estimation, is 60 to 65%. The left ventricle has normal function. The left ventricle has no regional wall motion abnormalities. There is moderate left ventricular hypertrophy. Left ventricular diastolic parameters are indeterminate.  2. Right ventricular systolic function is normal. The right ventricular size is normal. There is mildly elevated pulmonary artery systolic pressure.  3. Left atrial size was mildly dilated.  4. Right atrial size was mildly dilated.  5. The mitral valve is normal in structure. No evidence of mitral valve regurgitation. Moderate mitral annular calcification.  6. The aortic valve is tricuspid. There is mild  calcification of the aortic valve. Aortic valve regurgitation is not visualized.  7. The inferior vena cava is dilated in size with >50% respiratory variability, suggesting right atrial pressure of 8 mmHg. FINDINGS  Left Ventricle: Left ventricular ejection fraction, by estimation, is 60 to 65%. The left ventricle has normal function. The left ventricle has no regional wall motion abnormalities. The left ventricular internal cavity size was normal in size. There is  moderate left ventricular hypertrophy. Left ventricular diastolic parameters are indeterminate. Right Ventricle: The right ventricular size is normal. No increase in right ventricular wall thickness. Right ventricular systolic function is normal. There is mildly elevated pulmonary artery systolic pressure. The tricuspid regurgitant velocity is 2.90  m/s, and with an assumed right atrial pressure of 8 mmHg, the estimated right ventricular systolic pressure is 69.4 mmHg. Left Atrium: Left atrial size was mildly dilated. Right Atrium: Right atrial size was mildly dilated. Pericardium: There is no evidence of pericardial effusion. Mitral Valve: The mitral valve is normal in structure. Moderate mitral annular calcification. No evidence of mitral valve regurgitation. Tricuspid Valve: The tricuspid valve is normal in structure. Tricuspid valve regurgitation is mild. Aortic Valve: The aortic valve is tricuspid. There is mild calcification of the aortic valve. Aortic valve regurgitation is not visualized. Pulmonic Valve: The pulmonic valve was grossly normal. Pulmonic valve regurgitation is not visualized. Aorta: The aortic root and ascending aorta are structurally normal, with no  evidence of dilitation. Venous: The inferior vena cava is dilated in size with greater than 50% respiratory variability, suggesting right atrial pressure of 8 mmHg. IAS/Shunts: No atrial level shunt detected by color flow Doppler.  LEFT VENTRICLE PLAX 2D LVIDd:         4.40 cm    Diastology LVIDs:         3.40 cm   LV e' medial:  5.66 cm/s LV PW:         1.20 cm   LV e' lateral: 7.94 cm/s LV IVS:        1.20 cm LVOT diam:     2.00 cm LV SV:         59 LV SV Index:   32 LVOT Area:     3.14 cm  RIGHT VENTRICLE             IVC RV S prime:     12.50 cm/s  IVC diam: 2.30 cm TAPSE (M-mode): 2.1 cm LEFT ATRIUM             Index        RIGHT ATRIUM           Index LA diam:        4.60 cm 2.48 cm/m   RA Area:     15.60 cm LA Vol (A2C):   54.8 ml 29.57 ml/m  RA Volume:   40.30 ml  21.75 ml/m LA Vol (A4C):   62.4 ml 33.67 ml/m LA Biplane Vol: 58.5 ml 31.57 ml/m  AORTIC VALVE LVOT Vmax:   94.20 cm/s LVOT Vmean:  68.900 cm/s LVOT VTI:    0.189 m  AORTA Ao Root diam: 3.10 cm Ao Asc diam:  2.80 cm TRICUSPID VALVE TR Peak grad:   33.6 mmHg TR Vmax:        290.00 cm/s  SHUNTS Systemic VTI:  0.19 m Systemic Diam: 2.00 cm [name] Electronically signed by [name] Signature Date/Time: 07/31/2022/6:12:24 PM    Final    CT Angio Chest PE W and/or Wo Contrast  Result Date: 07/30/2022 CLINICAL DATA:  Pulmonary embolism suspected, high probability. Shortness of breath and cough for 6 weeks. EXAM: CT ANGIOGRAPHY CHEST WITH CONTRAST TECHNIQUE: Multidetector CT imaging of the chest was performed using the standard protocol during bolus administration of intravenous contrast. Multiplanar CT image reconstructions and MIPs were obtained to evaluate the vascular anatomy. RADIATION DOSE REDUCTION: This exam was performed according to the departmental dose-optimization program which includes automated exposure control, adjustment of the mA and/or kV according to patient size and/or use of iterative reconstruction technique. CONTRAST:  78m OMNIPAQUE IOHEXOL 350 MG/ML SOLN COMPARISON:  01/01/2022. FINDINGS: Cardiovascular: The heart is enlarged and there is no pericardial effusion. Multi-vessel coronary artery calcifications are noted. There is atherosclerotic calcification of the aorta without evidence of  aneurysm. The pulmonary trunk is normal in caliber. No pulmonary artery filling defect is identified. Mediastinum/Nodes: Mediastinal and hilar lymphadenopathy is noted bilaterally. No axillary lymphadenopathy. The thyroid gland, trachea, and esophagus are within normal limits. Lungs/Pleura: There is a small right pleural effusion. No pneumothorax. Diffuse patchy airspace disease and interlobular septal thickening are noted bilaterally, greater on the right than on the left. Upper Abdomen: No acute abnormality. Musculoskeletal: Sternotomy wires are noted. Degenerative changes are present in the thoracic spine. No acute osseous abnormality. Review of the MIP images confirms the above findings. IMPRESSION: 1. No evidence of pulmonary embolism. 2. Diffuse patchy airspace disease and interlobular septal thickening in  the lungs bilaterally, possible edema versus multifocal pneumonia. 3. Small right pleural effusion. 4. Cardiomegaly with multi-vessel coronary artery calcifications. 5. Aortic atherosclerosis. Electronically Signed   By: Brett Fairy M.D.   On: 07/30/2022 20:59   DG Chest 1 View  Result Date: 07/30/2022 CLINICAL DATA:  Cough EXAM: CHEST  1 VIEW COMPARISON:  10/09/2019 FINDINGS: Postop CABG.  Cardiac enlargement. Right upper lobe airspace disease is new and appears acute. This could be infection or edema. Mild vascular congestion. No pleural effusion. IMPRESSION: Right upper lobe infiltrate may represent edema or pneumonia. Cardiac enlargement with vascular congestion. Electronically Signed   By: Franchot Gallo M.D.   On: 07/30/2022 18:17         Latest Ref Rng & Units 06/17/2016    2:22 PM  PFT Results  FVC-Pre L 2.03   FVC-Predicted Pre % 47   FVC-Post L 1.86   FVC-Predicted Post % 43   Pre FEV1/FVC % % 76   Post FEV1/FCV % % 81   FEV1-Pre L 1.54   FEV1-Predicted Pre % 46   FEV1-Post L 1.51     No results found for: "NITRICOXIDE"      Assessment & Plan:   Multifocal  pneumonia Right greater than left pulmonary infiltrates suspicious for multifocal pneumonia versus organizing pneumonia.  -Patient has been treated with empiric antibiotics and empiric steroids.  -Clinically has improved, chest x-ray shows significant improvement with resolution of bilateral infiltrates. Patient will taper off of prednisone over the next 2 weeks.  Plan  Patient Instructions  Decrease prednisone 30 mg daily for 4 days then 20 mg daily for 4 days then 10 mg daily for 4 days and stop. Work on not smoking Begin Stiolto 2 puffs daily.  Activity as tolerated Wear CPAP At bedtime .  CPAP download  Follow up in 4-6 weeks with Dr. Ander Slade or Letrice Pollok NP with PFT and As needed   Please contact office for sooner follow up if symptoms do not improve or worsen or seek emergency care       '  Tobacco abuse Smoking cessation discussed.  Organizing pneumonia ([place]) Suspected organizing pneumonia with steroid responsive infiltrates.  Patient has been treated with steroid challenge.  Patient is currently on prednisone 40 mg.  We will taper off slowly over the next 12 days.  Plan  Patient Instructions  Decrease prednisone 30 mg daily for 4 days then 20 mg daily for 4 days then 10 mg daily for 4 days and stop. Work on not smoking Begin Stiolto 2 puffs daily.  Activity as tolerated Wear CPAP At bedtime .  CPAP download  Follow up in 4-6 weeks with Dr. Ander Slade or Kishaun Erekson NP with PFT and As needed   Please contact office for sooner follow up if symptoms do not improve or worsen or seek emergency care         COPD (chronic obstructive pulmonary disease) ([place]) Presumed COPD-heavy smoking history.  Smoking cessation is discussed in detail.  Patient will begin Stiolto 2 puffs daily.  Check PFTs on return  Plan  Patient Instructions  Decrease prednisone 30 mg daily for 4 days then 20 mg daily for 4 days then 10 mg daily for 4 days and stop. Work on not smoking Begin Stiolto 2  puffs daily.  Activity as tolerated Wear CPAP At bedtime .  CPAP download  Follow up in 4-6 weeks with Dr. Ander Slade or Coraima Tibbs NP with PFT and As needed   Please contact office for  sooner follow up if symptoms do not improve or worsen or seek emergency care         OSA (obstructive sleep apnea) Obstructive sleep apnea.  Patient has a history of obstructive sleep apnea.  We will request his previous sleep study and get a CPAP download.  Patient is to continue on CPAP at bedtime.  Plan  Patient Instructions  Decrease prednisone 30 mg daily for 4 days then 20 mg daily for 4 days then 10 mg daily for 4 days and stop. Work on not smoking Begin Stiolto 2 puffs daily.  Activity as tolerated Wear CPAP At bedtime .  CPAP download  Follow up in 4-6 weeks with Dr. Ander Slade or Donnell Wion NP with PFT and As needed   Please contact office for sooner follow up if symptoms do not improve or worsen or seek emergency care         Chronic respiratory failure with hypoxia ([place]) Started on oxygen prior to discharge.  Patient is continue with oxygen with activity and at bedtime as needed to keep O2 saturations greater than 88 to 90%.    Rexene Edison, NP 08/18/2022

## 2022-08-18 NOTE — Assessment & Plan Note (Signed)
Suspected organizing pneumonia with steroid responsive infiltrates.  Patient has been treated with steroid challenge.  Patient is currently on prednisone 40 mg.  We will taper off slowly over the next 12 days.  Plan  Patient Instructions  Decrease prednisone 30 mg daily for 4 days then 20 mg daily for 4 days then 10 mg daily for 4 days and stop. Work on not smoking Begin Stiolto 2 puffs daily.  Activity as tolerated Wear CPAP At bedtime .  CPAP download  Follow up in 4-6 weeks with Dr. Ander Slade or Aariya Ferrick NP with PFT and As needed   Please contact office for sooner follow up if symptoms do not improve or worsen or seek emergency care

## 2022-08-18 NOTE — Telephone Encounter (Signed)
Patient was seen in the office today as a hospital f/u.  He wears a CPAP machine and did not know the name of the DME company.  He was asked to call us back with the name of the DME company and the phone # so we can request a download.  Will await return call.

## 2022-08-18 NOTE — Assessment & Plan Note (Signed)
Started on oxygen prior to discharge.  Patient is continue with oxygen with activity and at bedtime as needed to keep O2 saturations greater than 88 to 90%.

## 2022-08-18 NOTE — Progress Notes (Signed)
Patient seen in the office today and instructed on use of Stiolto.  Patient expressed understanding and demonstrated technique. 

## 2022-08-19 DIAGNOSIS — A419 Sepsis, unspecified organism: Secondary | ICD-10-CM | POA: Diagnosis not present

## 2022-08-19 NOTE — Telephone Encounter (Signed)
[place], they will fax over orders to be signed for his CPAP machine as his orders are currently inactive.  They will also fax over a compliance report.  She stated he has an SD card in his machine.  Will await fax.

## 2022-08-19 NOTE — Telephone Encounter (Signed)
Called and spoke with patient's daughter, Roselyn Reef ([place]).  Verified the information we received yesterday regarding his DME company. I let her know that I would call them and get his information and have a download faxed to [place]. Nothing further needed.

## 2022-08-24 ENCOUNTER — Other Ambulatory Visit: Payer: Medicare Other

## 2022-08-24 DIAGNOSIS — I5022 Chronic systolic (congestive) heart failure: Secondary | ICD-10-CM | POA: Diagnosis not present

## 2022-08-24 DIAGNOSIS — N179 Acute kidney failure, unspecified: Secondary | ICD-10-CM | POA: Diagnosis not present

## 2022-08-24 DIAGNOSIS — E538 Deficiency of other specified B group vitamins: Secondary | ICD-10-CM | POA: Diagnosis not present

## 2022-08-26 NOTE — Telephone Encounter (Signed)
Received fax from [place] for CPAP download, he has a set pressure at 18 cm H20.  Fax placed in [facility]'s review folder.

## 2022-08-28 ENCOUNTER — Ambulatory Visit: Payer: Medicare Other | Admitting: Podiatry

## 2022-08-30 DIAGNOSIS — R29818 Other symptoms and signs involving the nervous system: Secondary | ICD-10-CM | POA: Diagnosis not present

## 2022-08-31 ENCOUNTER — Other Ambulatory Visit: Payer: Self-pay | Admitting: Nurse Practitioner

## 2022-08-31 DIAGNOSIS — D72829 Elevated white blood cell count, unspecified: Secondary | ICD-10-CM

## 2022-09-02 DIAGNOSIS — L538 Other specified erythematous conditions: Secondary | ICD-10-CM | POA: Diagnosis not present

## 2022-09-02 DIAGNOSIS — L989 Disorder of the skin and subcutaneous tissue, unspecified: Secondary | ICD-10-CM | POA: Diagnosis not present

## 2022-09-02 DIAGNOSIS — Z789 Other specified health status: Secondary | ICD-10-CM | POA: Diagnosis not present

## 2022-09-02 DIAGNOSIS — L820 Inflamed seborrheic keratosis: Secondary | ICD-10-CM | POA: Diagnosis not present

## 2022-09-02 DIAGNOSIS — C44519 Basal cell carcinoma of skin of other part of trunk: Secondary | ICD-10-CM | POA: Diagnosis not present

## 2022-09-02 DIAGNOSIS — L298 Other pruritus: Secondary | ICD-10-CM | POA: Diagnosis not present

## 2022-09-04 LAB — FUNGAL ORGANISM REFLEX

## 2022-09-04 LAB — FUNGUS CULTURE RESULT

## 2022-09-04 LAB — FUNGUS CULTURE WITH STAIN

## 2022-09-07 ENCOUNTER — Other Ambulatory Visit (INDEPENDENT_AMBULATORY_CARE_PROVIDER_SITE_OTHER): Payer: Medicare Other

## 2022-09-07 DIAGNOSIS — D72829 Elevated white blood cell count, unspecified: Secondary | ICD-10-CM | POA: Diagnosis not present

## 2022-09-07 LAB — CBC
HCT: 43.3 % (ref 39.0–52.0)
Hemoglobin: 14.3 g/dL (ref 13.0–17.0)
MCHC: 32.9 g/dL (ref 30.0–36.0)
MCV: 87.5 fl (ref 78.0–100.0)
Platelets: 146 10*3/uL — ABNORMAL LOW (ref 150.0–400.0)
RBC: 4.95 Mil/uL (ref 4.22–5.81)
RDW: 17.1 % — ABNORMAL HIGH (ref 11.5–15.5)
WBC: 8 10*3/uL (ref 4.0–10.5)

## 2022-09-13 DIAGNOSIS — E119 Type 2 diabetes mellitus without complications: Secondary | ICD-10-CM | POA: Diagnosis not present

## 2022-09-16 DIAGNOSIS — E113412 Type 2 diabetes mellitus with severe nonproliferative diabetic retinopathy with macular edema, left eye: Secondary | ICD-10-CM | POA: Diagnosis not present

## 2022-09-16 MED ORDER — STIOLTO RESPIMAT 2.5-2.5 MCG/ACT IN AERS: 2.0000 | INHALATION_SPRAY | Freq: Every day | RESPIRATORY_TRACT | 6 refills | Status: DC

## 2022-09-18 DIAGNOSIS — A419 Sepsis, unspecified organism: Secondary | ICD-10-CM | POA: Diagnosis not present

## 2022-09-27 LAB — ACID FAST CULTURE WITH REFLEXED SENSITIVITIES (MYCOBACTERIA): Acid Fast Culture: NEGATIVE

## 2022-09-30 ENCOUNTER — Ambulatory Visit (INDEPENDENT_AMBULATORY_CARE_PROVIDER_SITE_OTHER): Payer: Medicare Other | Admitting: Podiatry

## 2022-09-30 ENCOUNTER — Ambulatory Visit (INDEPENDENT_AMBULATORY_CARE_PROVIDER_SITE_OTHER): Payer: Medicare Other | Admitting: Nurse Practitioner

## 2022-09-30 ENCOUNTER — Telehealth: Payer: Self-pay | Admitting: Nurse Practitioner

## 2022-09-30 VITALS — BP 122/70 | HR 66 | Temp 97.9°F | Ht 66.0 in | Wt 181.0 lb

## 2022-09-30 DIAGNOSIS — R519 Headache, unspecified: Secondary | ICD-10-CM | POA: Insufficient documentation

## 2022-09-30 DIAGNOSIS — W19XXXA Unspecified fall, initial encounter: Secondary | ICD-10-CM | POA: Diagnosis not present

## 2022-09-30 DIAGNOSIS — E1165 Type 2 diabetes mellitus with hyperglycemia: Secondary | ICD-10-CM

## 2022-09-30 DIAGNOSIS — M79609 Pain in unspecified limb: Secondary | ICD-10-CM | POA: Diagnosis not present

## 2022-09-30 DIAGNOSIS — B351 Tinea unguium: Secondary | ICD-10-CM | POA: Diagnosis not present

## 2022-09-30 DIAGNOSIS — L84 Corns and callosities: Secondary | ICD-10-CM

## 2022-09-30 DIAGNOSIS — Z79899 Other long term (current) drug therapy: Secondary | ICD-10-CM

## 2022-09-30 DIAGNOSIS — E1142 Type 2 diabetes mellitus with diabetic polyneuropathy: Secondary | ICD-10-CM | POA: Diagnosis not present

## 2022-09-30 MED ORDER — INSULIN PEN NEEDLE 32G X 4 MM MISC: 3 refills | Status: AC

## 2022-09-30 NOTE — Patient Instructions (Signed)
Nice to see you today I want you to keep your appointment as scheudled with me later this month I will let the behavioral health provider know about the EKG.  If that headache gets worse or you become confused go to the nearest emergency department

## 2022-09-30 NOTE — Assessment & Plan Note (Signed)
Secondary to follow-up.  Continue to do over-the-counter analgesics as needed.  Signs and symptoms reviewed when to seek emergent healthcare.

## 2022-09-30 NOTE — Telephone Encounter (Signed)
Completed EKG at request of [facility] provider. I am not able to send an EMR message. Can we let them know we have done the EKG and fax it to them if they request it

## 2022-09-30 NOTE — Assessment & Plan Note (Signed)
Unsure if he lost LOC was sitting on bed and fell asleep woke up on the floor striking right side of head.  Neurological exam completely intact.  Pupils at baseline per patient and patient's daughter is at bedside.  Strict signs and symptoms reviewed when to seek emergent and urgent health care.

## 2022-09-30 NOTE — Telephone Encounter (Signed)
Cy called back from Dr. Oralia Manis office and requested that the EKG be faxed to them at [phone].

## 2022-09-30 NOTE — Assessment & Plan Note (Signed)
Patient is followed by psychiatry and previous EKG had a prolonged QTc of greater than 501 and increase patient's Celexa and reached out to patient's cardiologist but they required a updated EKG EKG performed in office today.  QTc around 440 will alert [facility] psychiatry that the EKG was done and they can review

## 2022-09-30 NOTE — Progress Notes (Signed)
Established Patient Office Visit  Subjective   Patient ID: [name], male    DOB: [birth year]  Age: 61 y.o. MRN: [id number]  Chief Complaint  Patient presents with   Acute Visit    Needs EKG done in order to increase meds    HPI  Patient is followed by Dr. Vevelyn Pat psychiatry at [facility].  Patient was recently seen by them on 09/17/2022.  Per patient report he is requesting to have an EKG before they increase his medications further. At time of office visit unable to get care everywhere to download most recent telemedicine visit with psychiatrist.  States that they have reached out to his cardiologist. States they wasnted a repeak EKG  Cough: States that he was recently admitted for PNA. He states since then he has had a cough. States that he has a cough and coughing up yellow sputum. States it has improved since onset. States he normally coughs up yellow stuff. States that he may feel more short of breath than normal . States that   Hit his head: states that he was sitting up in bed last night and dozzed off then struck head on the foot board and then was on the floor. Unsure of loc. Does hav e Dorena Cookey states that he took tylenol and it went away. States that he woke up with the headache. Not the worse headache of his life. States throbbing     Review of Systems  Constitutional:  Positive for chills. Negative for fever.  Respiratory:  Positive for cough, sputum production and shortness of breath. Negative for wheezing.   Cardiovascular:  Negative for leg swelling.  Neurological:  Negative for headaches.      Objective:     BP 122/70 (BP Location: Left Arm, Patient Position: Sitting, Cuff Size: Normal)   Pulse 66   Temp 97.9 F (36.6 C) (Temporal)   Ht '5\' 6"'$  (1.676 m)   Wt 181 lb (82.1 kg)   SpO2 97%   BMI 29.21 kg/m    Physical Exam Vitals and nursing note reviewed.  Constitutional:      Appearance: Normal appearance.  HENT:     Right Ear: Tympanic membrane,  ear canal and external ear normal.     Left Ear: Tympanic membrane, ear canal and external ear normal.     Mouth/Throat:     Mouth: Mucous membranes are moist.     Pharynx: Oropharynx is clear.  Eyes:     Extraocular Movements: Extraocular movements intact.     Pupils: Pupils are equal, round, and reactive to light.     Comments: Right pupil larger and less responsive than left. Base line per patient report  Cardiovascular:     Rate and Rhythm: Normal rate and regular rhythm.     Heart sounds: Normal heart sounds.  Neurological:     General: No focal deficit present.     Mental Status: He is alert.     Cranial Nerves: Cranial nerves 2-12 are intact.     Sensory: Sensation is intact.     Motor: Motor function is intact.     Coordination: Coordination is intact. Finger-Nose-Finger Test normal.     Gait: Gait is intact.     Deep Tendon Reflexes:     Reflex Scores:      Bicep reflexes are 2+ on the right side and 2+ on the left side.      Patellar reflexes are 2+ on the right side and 2+ on the  left side.    Comments: Bilateral upper and lower extremity strength 5/5      No results found for any visits on 09/30/22.    The ASCVD Risk score (Arnett DK, et al., 2019) failed to calculate for the following reasons:   The patient has a prior MI or stroke diagnosis    Assessment & Plan:   Problem List Items Addressed This Visit       Other   High risk medication use - Primary    Patient is followed by psychiatry and previous EKG had a prolonged QTc of greater than 501 and increase patient's Celexa and reached out to patient's cardiologist but they required a updated EKG EKG performed in office today.  QTc around 440 will alert [facility] psychiatry that the EKG was done and they can review      Relevant Orders   EKG 12-Lead (Completed)   Fall    Unsure if he lost LOC was sitting on bed and fell asleep woke up on the floor striking right side of head.  Neurological exam completely  intact.  Pupils at baseline per patient and patient's daughter is at bedside.  Strict signs and symptoms reviewed when to seek emergent and urgent health care.      Acute nonintractable headache    Secondary to follow-up.  Continue to do over-the-counter analgesics as needed.  Signs and symptoms reviewed when to seek emergent healthcare.      Other Visit Diagnoses     Uncontrolled type 2 diabetes mellitus with hyperglycemia ([place])       Relevant Medications   Insulin Pen Needle 32G X 4 MM MISC       Return for As scheduled.    Romilda Garret, NP

## 2022-09-30 NOTE — Patient Instructions (Signed)
Look for urea 40% cream or ointment and apply to the thickened dry skin / calluses. This can be bought over the counter, at a pharmacy or online such as [employer].  

## 2022-09-30 NOTE — Telephone Encounter (Signed)
EKG faxed as instructed. Fax sheet shows that it went thru.

## 2022-09-30 NOTE — Telephone Encounter (Signed)
Left a message on voicemail at [facility] for someone to call back regarding the patient. [facility] phone [phone]

## 2022-10-01 NOTE — Progress Notes (Signed)
  Subjective:  Patient ID: [name], male    DOB: Jun 03, 1961,  MRN: [id number]  Chief Complaint  Patient presents with   Nail Problem    Thick painful toenails, 3 month follow up    61 y.o. male presents with the above complaint. History confirmed with patient.  He returns for follow-up.  His blood sugar is well-controlled.  His nails are thickened and elongated and his calluses have returned and are causing discomfort.  Objective:  Physical Exam: warm, good capillary refill, no trophic changes or ulcerative lesions, normal DP and PT pulses, and he has an abnormal sensory exam with loss of protective sensation. Left Foot: dystrophic yellowed discolored nail plates with subungual debris and fourth PIPJ callus Right Foot: dystrophic yellowed discolored nail plates with subungual debris and submetatarsal 5 and medial hallux pinch callus  Assessment:   1. Pain due to onychomycosis of nail   2. Callus   3. Type 2 diabetes mellitus with peripheral neuropathy (HCC)      Plan:  Patient was evaluated and treated and all questions answered.  Patient educated on diabetes. Discussed proper diabetic foot care and discussed risks and complications of disease. Educated patient in depth on reasons to return to the office immediately should he/she discover anything concerning or new on the feet. All questions answered. Discussed proper shoes as well.   Discussed the etiology and treatment options for the condition in detail with the patient. Educated patient on the topical and oral treatment options for mycotic nails. Recommended debridement of the nails today. Sharp and mechanical debridement performed of all painful and mycotic nails today. Nails debrided in length and thickness using a nail nipper to level of comfort. Discussed treatment options including appropriate shoe gear. Follow up as needed for painful nails.   All symptomatic hyperkeratoses were safely debrided with a sterile #15  blade to patient's level of comfort without incident. We discussed preventative and palliative care of these lesions including supportive and accommodative shoegear, padding, prefabricated and custom molded accommodative orthoses, use of a pumice stone and lotions/creams daily.   Return in about 3 months (around 12/30/2022) for at risk diabetic foot care.

## 2022-10-03 DIAGNOSIS — A419 Sepsis, unspecified organism: Secondary | ICD-10-CM | POA: Diagnosis not present

## 2022-10-06 ENCOUNTER — Other Ambulatory Visit: Payer: Self-pay | Admitting: *Deleted

## 2022-10-06 DIAGNOSIS — J449 Chronic obstructive pulmonary disease, unspecified: Secondary | ICD-10-CM

## 2022-10-07 ENCOUNTER — Ambulatory Visit (INDEPENDENT_AMBULATORY_CARE_PROVIDER_SITE_OTHER): Payer: Medicare Other | Admitting: Adult Health

## 2022-10-07 ENCOUNTER — Encounter: Payer: Self-pay | Admitting: Adult Health

## 2022-10-07 ENCOUNTER — Ambulatory Visit (INDEPENDENT_AMBULATORY_CARE_PROVIDER_SITE_OTHER): Payer: Medicare Other

## 2022-10-07 ENCOUNTER — Ambulatory Visit (INDEPENDENT_AMBULATORY_CARE_PROVIDER_SITE_OTHER): Payer: Medicare Other | Admitting: Pulmonary Disease

## 2022-10-07 VITALS — BP 138/62 | HR 74 | Temp 98.3°F | Ht 66.0 in | Wt 183.0 lb

## 2022-10-07 DIAGNOSIS — J9611 Chronic respiratory failure with hypoxia: Secondary | ICD-10-CM

## 2022-10-07 DIAGNOSIS — J189 Pneumonia, unspecified organism: Secondary | ICD-10-CM | POA: Diagnosis not present

## 2022-10-07 DIAGNOSIS — G4733 Obstructive sleep apnea (adult) (pediatric): Secondary | ICD-10-CM | POA: Diagnosis not present

## 2022-10-07 DIAGNOSIS — J8489 Other specified interstitial pulmonary diseases: Secondary | ICD-10-CM | POA: Diagnosis not present

## 2022-10-07 DIAGNOSIS — J449 Chronic obstructive pulmonary disease, unspecified: Secondary | ICD-10-CM

## 2022-10-07 LAB — PULMONARY FUNCTION TEST
DL/VA % pred: 115 %
DL/VA: 4.95 ml/min/mmHg/L
DLCO cor % pred: 75 %
DLCO cor: 18.29 ml/min/mmHg
DLCO unc % pred: 74 %
DLCO unc: 18.13 ml/min/mmHg
FEF 25-75 Post: 2.72 L/sec
FEF 25-75 Pre: 1.22 L/sec
FEF2575-%Change-Post: 123 %
FEF2575-%Pred-Post: 106 %
FEF2575-%Pred-Pre: 47 %
FEV1-%Change-Post: 31 %
FEV1-%Pred-Post: 66 %
FEV1-%Pred-Pre: 50 %
FEV1-Post: 2.04 L
FEV1-Pre: 1.55 L
FEV1FVC-%Change-Post: 27 %
FEV1FVC-%Pred-Pre: 90 %
FEV6-%Change-Post: 2 %
FEV6-%Pred-Post: 60 %
FEV6-%Pred-Pre: 58 %
FEV6-Post: 2.34 L
FEV6-Pre: 2.27 L
FEV6FVC-%Pred-Post: 105 %
FEV6FVC-%Pred-Pre: 105 %
FVC-%Change-Post: 2 %
FVC-%Pred-Post: 57 %
FVC-%Pred-Pre: 55 %
FVC-Post: 2.34 L
FVC-Pre: 2.27 L
Post FEV1/FVC ratio: 87 %
Post FEV6/FVC ratio: 100 %
Pre FEV1/FVC ratio: 68 %
Pre FEV6/FVC Ratio: 100 %
RV % pred: 72 %
RV: 1.48 L
TLC % pred: 74 %
TLC: 4.62 L

## 2022-10-07 MED ORDER — BREZTRI AEROSPHERE 160-9-4.8 MCG/ACT IN AERO: 2.0000 | INHALATION_SPRAY | Freq: Two times a day (BID) | RESPIRATORY_TRACT | 0 refills | Status: DC

## 2022-10-07 MED ORDER — BREZTRI AEROSPHERE 160-9-4.8 MCG/ACT IN AERO: 2.0000 | INHALATION_SPRAY | Freq: Two times a day (BID) | RESPIRATORY_TRACT | 5 refills | Status: DC

## 2022-10-07 NOTE — Patient Instructions (Signed)
Full PFT performed today. °

## 2022-10-07 NOTE — Progress Notes (Signed)
Full PFT performed today. °

## 2022-10-07 NOTE — Assessment & Plan Note (Signed)
No desaturations on ambulation today in the office.  May discontinue home oxygen

## 2022-10-07 NOTE — Assessment & Plan Note (Signed)
Steroid responsive pulmonary infiltrates consistent with a cryptogenic organizing pneumonia.  Clinically patient is improved.  Chest x-ray last visit showed resolution.  Patient has been weaned off of all prednisone.  Will check chest x-ray today.  He has an upcoming CT chest early next year.  As part of the lung cancer screening program.  Plan  Patient Instructions  Work on not smoking Stop Stiolto  Begin Breztri 2 puffs Twice daily, rinse after use.  Activity as tolerated Wear CPAP At bedtime as night long  Order for new supplies  Change CPAP auto 10 to 20cmH2O.  Chest xray today .  Discontinue Oxygen .  Continue with Lung Cancer CT chest screening (Due March 2024)  Follow up in 3 months with Dr. Ander Slade or Nicholson Starace NP with and As needed   Please contact office for sooner follow up if symptoms do not improve or worsen or seek emergency care

## 2022-10-07 NOTE — Patient Instructions (Addendum)
Work on not smoking Stop Occupational psychologist 2 puffs Twice daily, rinse after use.  Activity as tolerated Wear CPAP At bedtime as night long  Order for new supplies  Change CPAP auto 10 to 20cmH2O.  Chest xray today .  Discontinue Oxygen .  Continue with Lung Cancer CT chest screening (Due March 2024)  Follow up in 3 months with Dr. Ander Slade or Leonela Kivi NP with and As needed   Please contact office for sooner follow up if symptoms do not improve or worsen or seek emergency care

## 2022-10-07 NOTE — Assessment & Plan Note (Signed)
Continue on CPAP at bedtime. CPAP download requested

## 2022-10-07 NOTE — Progress Notes (Signed)
 @Patient ID: [name], male    DOB: 08/08/1961, 61 y.o.   MRN: [id number]  Chief Complaint  Patient presents with   Follow-up    Referring provider: Cable, Markey M, NP  HPI: 61-year-old male active smoker seen for pulmonary consult during hospitalization October 2023 for acute respiratory failure and presumed pneumonia/organizing pneumonia with abnormal CT chest with bilateral infiltrates Medical history significant for heart failure, coronary disease, diabetes and A-fib.  TEST/EVENTS :  10/12>> CTA chest: Multifocal infiltrates-right> left.  No PE. 10/13>> ANCA profile: Neg  10/13>> ANA: Neg  10/13>> RA factor: 16.2  10/13>> CRP: 29.7, ESR 57    Significant microbiology data: 10/12>> HIV negative 10/12>> COVID/influenza PCR: Negative 10/12>> respiratory virus panel: Negative 10/12>> blood culture: No growth 10/13>> sputum culture: neg  10/14>> BAL culture: No growth 10/14>> BAL AFB smear: Neg  10/14>> BAL AFB culture: Neg  10/14>> BAL fungal culture: Neg    Pathology data 10/14>> BAL cytology: Negative malignant cells, pulmonary macrophages and mixed inflammatory cells with scattered benign bronchial cells and squamous metaplasia, atypical bronchial cells favor reactive   2D echo EF at 60 to 65%, moderate LVH, mildly elevated pulmonary artery systolic pressure  10/07/2022 Follow up : COPD, Pneumonia/COP, OSA , O2 RF  Patient returns for a 2-month follow-up.  Patient was seen last visit for a posthospital follow-up.  Patient was hospitalized in October for presumed multifocal pneumonia plus or minus cryptogenic organizing pneumonia and acute respiratory failure.  He was treated with aggressive IV antibiotics.  CT chest showed multifocal infiltrates right greater than left and was negative for PE.  Autoimmune and connective tissue workup was unrevealing.  Except for mildly elevated rheumatoid arthritis and elevated CRP and sed rate.  HIV and viral panel were negative.  He  underwent bronchoscopy October 14.  Cytology was negative for malignant cells.  Showed pulmonary macrophages and mixed inflammatory cells with scattered benign bronchial cells and squamous metaplasia.  Atypical bronchial cells favor reactive.  Cultures were negative.  Patient was started on empiric steroids for possible cryptogenic organizing pneumonia.  Patient had clinical improvement.  He also was discharged on oxygen.  Patient says he has stopped using his oxygen.  Says he is starting to feel better.  Follow-up chest x-ray last visit showed resolution of infiltrates.  Patient was slowly tapered off prednisone.  He also was started on Stiolto for presumed COPD.  Patient was set up for PFTs that were completed this morning.  This showed moderate to severe COPD with an asthma overlap.  FEV1 was 50%, ratio 68 with FVC at 55%.  Postbronchodilator change of 31%.  Postbronchodilator FEV1 66%, ratio 87, FVC 57%.  DLCO 74%.  We discussed his results in detail.  Patient has underlying sleep apnea.  The patient says he is trying to wear his CPAP.  CPAP supplies are old and mask is leaking terrible.  CPAP download shows 50 to 60% compliance over the last month.  Daily average usage is 5 hours.  Patient is on CPAP at 18 cm H2O.  AHI is 6.8/hour. Patient says he cannot tell much difference with his Stiolto.  Continues to get some shortness of breath.  But is definitely feeling better since he was in the hospital He participates in the lung cancer CT screening program.  Next scan is due in March 2024.  He declines flu vaccine Walk test today in the office shows no desaturations with ambulation on room air.  We discussed getting rid of   his home oxygen.    Allergies  Allergen Reactions   Latex Rash    Immunization History  Administered Date(s) Administered   Influenza,inj,quad, With Preservative 09/21/2019   PNEUMOCOCCAL CONJUGATE-20 10/29/2021   Pneumococcal Polysaccharide-23 09/26/2019    Past Medical  History:  Diagnosis Date   Abrasion L FOREARM   Atrial fibrillation (HCC)    a. initially occurring in post-op setting in 05/2016, started on Eliquis b. 08/2016: s/p clipping of atrial appendage at time of CABG --> Eliquis switched to Coumadin   CAD (coronary artery disease)    a. 05/2016: NSTEMI, cath showing 3v disease with CABG recommended once MRSA infection resolved. b. 08/2016: CABG with LIMA-LAD, SVG-D1, and SVG-RI.   Cancer (HCC)    basal cell carcinoma of the skin   Diabetes mellitus without complication (HCC)    Headache(784.0)    Hematuria, microscopic "SINCE I WAS A KID"   Hyperlipidemia    Hypertension    Ischemic cardiomyopathy    a. 05/2016: echo w/ EF of 25-35% and akinesis of the basal-midinferolateral and inferior myocardium.   Lactose intolerance    Myocardial infarction (HCC)    Respiratory failure (HCC)    Rotator cuff tear RIGHT   Tobacco use    a. quit in 05/2016 following MI    Tobacco History: Social History   Tobacco Use  Smoking Status Some Days   Packs/day: 2.00   Years: 42.00   Total pack years: 84.00   Types: Cigarettes  Smokeless Tobacco Never  Tobacco Comments   Smoked 12 cigarettes in 12 days.  Trying to quit.  08/18/2022 hfb   Ready to quit: No Counseling given: Yes Tobacco comments: Smoked 12 cigarettes in 12 days.  Trying to quit.  08/18/2022 hfb   Outpatient Medications Prior to Visit  Medication Sig Dispense Refill   acetaminophen (TYLENOL) 500 MG tablet Take 1,000 mg by mouth every 6 (six) hours as needed for headache.     aspirin 81 MG chewable tablet Chew 1 tablet (81 mg total) by mouth daily. 30 tablet 3   atorvastatin (LIPITOR) 80 MG tablet Take 1 tablet (80 mg total) by mouth at bedtime. 30 tablet 11   ezetimibe (ZETIA) 10 MG tablet Take 1 tablet by mouth daily.     gabapentin (NEURONTIN) 300 MG capsule TAKE 2 CAPSULES BY MOUTH AT BEDTIME (Patient taking differently: Take 600 mg by mouth at bedtime.) 60 capsule 5   glucose  blood test strip 1 each by Other route as needed for other. For one touch ultra meter. DX: E11.9 100 each 3   insulin isophane & regular human KwikPen (HUMULIN 70/30 KWIKPEN) (70-30) 100 UNIT/ML KwikPen Inject 50-60 Units into the skin See admin instructions. Please take 50 units every morning and 60 units with dinner     Insulin Pen Needle 32G X 4 MM MISC Use to inject Insulin daily. Dx: E11.9 100 each 3   Insulin Syringe-Needle U-100 (INSULIN SYRINGE .3CC/31GX5/16") 31G X 5/16" 0.3 ML MISC 1 Units by Does not apply route 2 (two) times daily. 100 each 2   JARDIANCE 25 MG TABS tablet Take 25 mg by mouth daily.     Lancets (ONETOUCH ULTRASOFT) lancets Use as instructed 100 each 12   metoCLOPramide (REGLAN) 5 MG tablet Take 1 tablet (5 mg total) by mouth daily as needed for nausea. 30 tablet 2   metoprolol succinate (TOPROL-XL) 100 MG 24 hr tablet Take 100 mg by mouth daily.     nicotine (NICODERM CQ -   DOSED IN MG/24 HOURS) 21 mg/24hr patch Place 1 patch (21 mg total) onto the skin daily. 28 patch 0   nitroGLYCERIN (NITROSTAT) 0.4 MG SL tablet Place 1 tablet (0.4 mg total) under the tongue every 5 (five) minutes as needed for chest pain. 25 tablet 3   sacubitril-valsartan (ENTRESTO) 24-26 MG Take 0.5 tablets by mouth 2 (two) times daily.     sitaGLIPtin (JANUVIA) 100 MG tablet Take 1 tablet by mouth daily.     traZODone (DESYREL) 50 MG tablet Take 100 mg by mouth at bedtime.     Tiotropium Bromide-Olodaterol (STIOLTO RESPIMAT) 2.5-2.5 MCG/ACT AERS Inhale 2 puffs into the lungs daily. 4 g 6   No facility-administered medications prior to visit.     Review of Systems:   Constitutional:   No  weight loss, night sweats,  Fevers, chills, fatigue, or  lassitude.  HEENT:   No headaches,  Difficulty swallowing,  Tooth/dental problems, or  Sore throat,                No sneezing, itching, ear ache, nasal congestion, post nasal drip,   CV:  No chest pain,  Orthopnea, PND, swelling in lower extremities,  anasarca, dizziness, palpitations, syncope.   GI  No heartburn, indigestion, abdominal pain, nausea, vomiting, diarrhea, change in bowel habits, loss of appetite, bloody stools.   Resp: No shortness of breath with exertion or at rest.  No excess mucus, no productive cough,  No non-productive cough,  No coughing up of blood.  No change in color of mucus.  No wheezing.  No chest wall deformity  Skin: no rash or lesions.  GU: no dysuria, change in color of urine, no urgency or frequency.  No flank pain, no hematuria   MS:  No joint pain or swelling.  No decreased range of motion.  No back pain.    Physical Exam  BP 138/62 (BP Location: Left Arm, Cuff Size: Normal)   Pulse 74   Temp 98.3 F (36.8 C) (Temporal)   Ht 5' 6" (1.676 m)   Wt 183 lb (83 kg)   SpO2 94%   BMI 29.54 kg/m   GEN: A/Ox3; pleasant , NAD, well nourished    HEENT:  [place]/AT,  EACs-clear, TMs-wnl, NOSE-clear, THROAT-clear, no lesions, no postnasal drip or exudate noted.   NECK:  Supple w/ fair ROM; no JVD; normal carotid impulses w/o bruits; no thyromegaly or nodules palpated; no lymphadenopathy.    RESP  Clear  P & A; w/o, wheezes/ rales/ or rhonchi. no accessory muscle use, no dullness to percussion  CARD:  RRR, no m/r/g, no peripheral edema, pulses intact, no cyanosis or clubbing.  GI:   Soft & nt; nml bowel sounds; no organomegaly or masses detected.   Musco: Warm bil, no deformities or joint swelling noted.   Neuro: alert, no focal deficits noted.    Skin: Warm, no lesions or rashes    Lab Results:  CBC    Component Value Date/Time   WBC 8.0 09/07/2022 1033   RBC 4.95 09/07/2022 1033   HGB 14.3 09/07/2022 1033   HCT 43.3 09/07/2022 1033   PLT 146.0 (L) 09/07/2022 1033   MCV 87.5 09/07/2022 1033   MCH 27.8 08/03/2022 0234   MCHC 32.9 09/07/2022 1033   RDW 17.1 (H) 09/07/2022 1033   LYMPHSABS 0.8 07/31/2022 1018   MONOABS 0.9 07/31/2022 1018   EOSABS 0.0 07/31/2022 1018   BASOSABS 0.0  07/31/2022 1018    BMET  Component Value Date/Time   NA 148 (H) 08/13/2022 1251   NA 146 03/08/2017 0000   K 4.5 08/13/2022 1251   CL 109 08/13/2022 1251   CO2 32 08/13/2022 1251   GLUCOSE 115 (H) 08/13/2022 1251   BUN 30 (H) 08/13/2022 1251   BUN 14 03/08/2017 0000   CREATININE 1.52 (H) 08/13/2022 1251   CREATININE 1.18 09/21/2016 1605   CALCIUM 8.7 08/13/2022 1251   GFRNONAA 40 (L) 08/03/2022 0234   GFRAA >60 12/04/2019 1820    BNP    Component Value Date/Time   BNP 896.2 (H) 07/31/2022 0548    ProBNP    Component Value Date/Time   PROBNP 309.0 (H) 08/13/2022 1251    Imaging: DG Chest 2 View  Result Date: 10/07/2022 CLINICAL DATA:  Pneumonia EXAM: CHEST - 2 VIEW COMPARISON:  Chest radiograph dated 08/18/2022. FINDINGS: The heart size and mediastinal contours are within normal limits. Both lungs are clear. Median sternotomy wires and a left atrial appendage clip are redemonstrated. Degenerative changes are seen in the spine. IMPRESSION: No active cardiopulmonary disease. Electronically Signed   By: Tyler  Litton M.D.   On: 10/07/2022 12:13         Latest Ref Rng & Units 10/07/2022   10:12 AM 06/17/2016    2:22 PM  PFT Results  FVC-Pre L 2.27  P 2.03   FVC-Predicted Pre % 55  P 47   FVC-Post L 2.34  P 1.86   FVC-Predicted Post % 57  P 43   Pre FEV1/FVC % % 68  P 76   Post FEV1/FCV % % 87  P 81   FEV1-Pre L 1.55  P 1.54   FEV1-Predicted Pre % 50  P 46   FEV1-Post L 2.04  P 1.51   DLCO uncorrected ml/min/mmHg 18.13  P   DLCO UNC% % 74  P   DLCO corrected ml/min/mmHg 18.29  P   DLCO COR %Predicted % 75  P   DLVA Predicted % 115  P   TLC L 4.62  P   TLC % Predicted % 74  P   RV % Predicted % 72  P     P Preliminary result    No results found for: "NITRICOXIDE"      Assessment & Plan:   COPD (chronic obstructive pulmonary disease) (HCC) Moderate to severe COPD with asthma overlap.  Patient is encouraged on smoking cessation.  Will change Stiolto  to Breztri.  Continue with lung cancer screening program.  Smoking cessation discussed  Plan  Patient Instructions  Work on not smoking Stop Stiolto  Begin Breztri 2 puffs Twice daily, rinse after use.  Activity as tolerated Wear CPAP At bedtime as night long  Order for new supplies  Change CPAP auto 10 to 20cmH2O.  Chest xray today .  Discontinue Oxygen .  Continue with Lung Cancer CT chest screening (Due March 2024)  Follow up in 3 months with Dr. Olalere or Parrett NP with and As needed   Please contact office for sooner follow up if symptoms do not improve or worsen or seek emergency care       Chronic respiratory failure with hypoxia (HCC) No desaturations on ambulation today in the office.  May discontinue home oxygen  Organizing pneumonia (HCC) Steroid responsive pulmonary infiltrates consistent with a cryptogenic organizing pneumonia.  Clinically patient is improved.  Chest x-ray last visit showed resolution.  Patient has been weaned off of all prednisone.  Will check chest x-ray today.    He has an upcoming CT chest early next year.  As part of the lung cancer screening program.  Plan  Patient Instructions  Work on not smoking Stop Stiolto  Begin Breztri 2 puffs Twice daily, rinse after use.  Activity as tolerated Wear CPAP At bedtime as night long  Order for new supplies  Change CPAP auto 10 to 20cmH2O.  Chest xray today .  Discontinue Oxygen .  Continue with Lung Cancer CT chest screening (Due March 2024)  Follow up in 3 months with Dr. Olalere or Parrett NP with and As needed   Please contact office for sooner follow up if symptoms do not improve or worsen or seek emergency care       OSA (obstructive sleep apnea) Continue on CPAP at bedtime. CPAP download requested   I spent   42 minutes dedicated to the care of this patient on the date of this encounter to include pre-visit review of records, face-to-face time with the patient discussing conditions above,  post visit ordering of testing, clinical documentation with the electronic health record, making appropriate referrals as documented, and communicating necessary findings to members of the patients care team.    Tammy Parrett, NP 10/07/2022  

## 2022-10-07 NOTE — Assessment & Plan Note (Signed)
Moderate to severe COPD with asthma overlap.  Patient is encouraged on smoking cessation.  Will change Stiolto to [employer].  Continue with lung cancer screening program.  Smoking cessation discussed  Plan  Patient Instructions  Work on not smoking Stop Stiolto  Begin Breztri 2 puffs Twice daily, rinse after use.  Activity as tolerated Wear CPAP At bedtime as night long  Order for new supplies  Change CPAP auto 10 to 20cmH2O.  Chest xray today .  Discontinue Oxygen .  Continue with Lung Cancer CT chest screening (Due March 2024)  Follow up in 3 months with Dr. Ander Slade or Maddalynn Barnard NP with and As needed   Please contact office for sooner follow up if symptoms do not improve or worsen or seek emergency care

## 2022-10-11 DIAGNOSIS — F341 Dysthymic disorder: Secondary | ICD-10-CM | POA: Insufficient documentation

## 2022-10-13 DIAGNOSIS — E119 Type 2 diabetes mellitus without complications: Secondary | ICD-10-CM | POA: Diagnosis not present

## 2022-10-14 ENCOUNTER — Encounter: Payer: Self-pay | Admitting: Nurse Practitioner

## 2022-10-14 ENCOUNTER — Ambulatory Visit (INDEPENDENT_AMBULATORY_CARE_PROVIDER_SITE_OTHER): Payer: Medicare Other | Admitting: Nurse Practitioner

## 2022-10-14 ENCOUNTER — Telehealth: Payer: Self-pay | Admitting: Nurse Practitioner

## 2022-10-14 VITALS — BP 128/60 | HR 94 | Temp 97.9°F | Resp 16 | Ht 66.0 in | Wt 183.1 lb

## 2022-10-14 DIAGNOSIS — R0902 Hypoxemia: Secondary | ICD-10-CM | POA: Diagnosis not present

## 2022-10-14 DIAGNOSIS — J9611 Chronic respiratory failure with hypoxia: Secondary | ICD-10-CM | POA: Diagnosis not present

## 2022-10-14 DIAGNOSIS — R0602 Shortness of breath: Secondary | ICD-10-CM | POA: Insufficient documentation

## 2022-10-14 DIAGNOSIS — I5020 Unspecified systolic (congestive) heart failure: Secondary | ICD-10-CM

## 2022-10-14 LAB — BASIC METABOLIC PANEL
BUN: 17 mg/dL (ref 6–23)
CO2: 32 mEq/L (ref 19–32)
Calcium: 8.4 mg/dL (ref 8.4–10.5)
Chloride: 109 mEq/L (ref 96–112)
Creatinine, Ser: 1.21 mg/dL (ref 0.40–1.50)
GFR: 64.69 mL/min (ref >60.00)
Glucose, Bld: 120 mg/dL — ABNORMAL HIGH (ref 70–99)
Potassium: 4.4 mEq/L (ref 3.5–5.1)
Sodium: 147 mEq/L — ABNORMAL HIGH (ref 135–145)

## 2022-10-14 LAB — BRAIN NATRIURETIC PEPTIDE: Pro B Natriuretic peptide (BNP): 446 pg/mL — ABNORMAL HIGH (ref 0.0–100.0)

## 2022-10-14 LAB — CBC
HCT: 39.9 % (ref 39.0–52.0)
Hemoglobin: 13 g/dL (ref 13.0–17.0)
MCHC: 32.5 g/dL (ref 30.0–36.0)
MCV: 89 fl (ref 78.0–100.0)
Platelets: 143 10*3/uL — ABNORMAL LOW (ref 150.0–400.0)
RBC: 4.49 Mil/uL (ref 4.22–5.81)
RDW: 17 % — ABNORMAL HIGH (ref 11.5–15.5)
WBC: 9.4 10*3/uL (ref 4.0–10.5)

## 2022-10-14 NOTE — Assessment & Plan Note (Signed)
Patient had desaturations in office and had a desaturation at home when he was not wearing his oxygen.  Patient will go back to wearing 3 L of nasal cannula at bedtime and 2 L with exertion.  I will reach out to pulmonology and make her aware

## 2022-10-14 NOTE — Assessment & Plan Note (Signed)
Patient had a slow wean off of prednisone.  States since hospitalization he has gained 13 pounds.  We will check labs inclusive of the BNP today.  Pending result

## 2022-10-14 NOTE — Assessment & Plan Note (Addendum)
New onset.  No gross weight change over the past few office visit.  Patient states since October he has gained 13 pounds secondary to steroid use per his report.  Will check labs today  Patient was informed if his oxygen saturations stayed low despite oxygen use or his symptoms increased to go to the nearest emergency department to be evaluated.

## 2022-10-14 NOTE — Patient Instructions (Signed)
Nice to see you today When you get up and start moving around I would wear 2L of oxygen. I will reach out to [facility] for further follow up. See me in 3 months, sooner If you need me

## 2022-10-14 NOTE — Progress Notes (Signed)
Established Patient Office Visit  Subjective   Patient ID: [name], male    DOB: Jul 05, 1961  Age: 61 y.o. MRN: [id number]  Chief Complaint  Patient presents with   Pneumonia    Follow up      PNA follow up: patient was hopsitalized on 07/30/2022 and discharged on 08/03/2022. Was placed on oxygen at night. He is here for a follow up for pna recheck   Patient was seen and evaluated by pulmonology on 10/07/2022.  They did a repeat chest x-ray that showed resolution of pneumonia.  They also did a walking test And did not capture any desaturations.  States that he woke up christmas morning and it was 77%. States that he has rechecked it and it has been in the 80s and 90s. States that he has OSA and was not wearing his CPAP. States that he has been wearing oxygen 3 l at night. States that morning he did not have the oxygen on that night.  States that over the last 4-5 days he has been short of breath. States that he has used the albuterol ihaler that has not helped. States that with activity he can feel short of breath.    Review of Systems  Constitutional:  Negative for chills and fever.  Respiratory:  Positive for cough (improving) and shortness of breath (increased).   Cardiovascular:  Negative for chest pain and leg swelling.      Objective:     BP 128/60   Pulse 94   Temp 97.9 F (36.6 C)   Resp 16   Ht '5\' 6"'$  (1.676 m)   Wt 183 lb 2 oz (83.1 kg)   SpO2 95%   BMI 29.56 kg/m  BP Readings from Last 3 Encounters:  10/14/22 128/60  10/07/22 138/62  09/30/22 122/70   Wt Readings from Last 3 Encounters:  10/14/22 183 lb 2 oz (83.1 kg)  10/07/22 183 lb (83 kg)  09/30/22 181 lb (82.1 kg)      Physical Exam Vitals and nursing note reviewed.  Constitutional:      Appearance: Normal appearance.  Cardiovascular:     Rate and Rhythm: Normal rate and regular rhythm.     Heart sounds: Normal heart sounds.  Pulmonary:     Effort: Pulmonary effort is normal.      Comments: Decreased in lower lobes Musculoskeletal:     Right lower leg: No edema.     Left lower leg: No edema.  Lymphadenopathy:     Cervical: No cervical adenopathy.  Neurological:     Mental Status: He is alert.      No results found for any visits on 10/14/22.    The ASCVD Risk score (Arnett DK, et al., 2019) failed to calculate for the following reasons:   The patient has a prior MI or stroke diagnosis    Assessment & Plan:   Problem List Items Addressed This Visit       Cardiovascular and Mediastinum   HFrEF (heart failure with reduced ejection fraction) ([place])    Patient had a slow wean off of prednisone.  States since hospitalization he has gained 13 pounds.  We will check labs inclusive of the BNP today.  Pending result      Relevant Orders   Basic metabolic panel   Brain natriuretic peptide     Respiratory   Hypoxia    O2 saturations fine when at rest he denies desatted down to 85% with exertion.  Will send  to his pulmonologist and note instructed patient to continue wearing 3 L of oxygen at bedtime and 2 L when he is up and about doing any activity.      Chronic respiratory failure with hypoxia ([place])    Patient had desaturations in office and had a desaturation at home when he was not wearing his oxygen.  Patient will go back to wearing 3 L of nasal cannula at bedtime and 2 L with exertion.  I will reach out to pulmonology and make her aware      Relevant Orders   CBC     Other   Shortness of breath - Primary    New onset.  No gross weight change over the past few office visit.  Patient states since October he has gained 13 pounds secondary to steroid use per his report.  Will check labs today  Patient was informed if his oxygen saturations stayed low despite oxygen use or his symptoms increased to go to the nearest emergency department to be evaluated.      Relevant Orders   Basic metabolic panel   CBC   Brain natriuretic peptide    Return in  about 3 months (around 01/13/2023) for Follow up on chronic conditions.    Romilda Garret, NP

## 2022-10-14 NOTE — Telephone Encounter (Signed)
Mayo Ao in office he complained for [name] for the past week. He O2 was fine sitting. I walked him around the office and his O2 dipped into mid 80s (85). I told him to wear 2L of O2 and I would reach out to you for further guidance.   Thanks for your help  Tish Frederickson

## 2022-10-14 NOTE — Assessment & Plan Note (Signed)
O2 saturations fine when at rest he denies desatted down to 85% with exertion.  Will send to his pulmonologist and note instructed patient to continue wearing 3 L of oxygen at bedtime and 2 L when he is up and about doing any activity.

## 2022-10-15 ENCOUNTER — Other Ambulatory Visit: Payer: Self-pay | Admitting: Nurse Practitioner

## 2022-10-15 DIAGNOSIS — R7989 Other specified abnormal findings of blood chemistry: Secondary | ICD-10-CM

## 2022-10-15 DIAGNOSIS — R0602 Shortness of breath: Secondary | ICD-10-CM

## 2022-10-15 MED ORDER — FUROSEMIDE 20 MG PO TABS: 20.0000 mg | ORAL_TABLET | Freq: Every day | ORAL | 0 refills | Status: DC

## 2022-10-16 ENCOUNTER — Telehealth: Payer: Self-pay

## 2022-10-16 NOTE — Telephone Encounter (Signed)
Spoke to pt about lab results.

## 2022-10-16 NOTE — Telephone Encounter (Signed)
Sending note to [place] pool.    [facility] Night - Client Nonclinical Telephone Record  [facility] Client [facility] Night - Client Client Site [place] - Night Provider Romilda Garret- NP Contact Type Call Who Is Calling Patient / Member / Family / Caregiver Caller Name [name] Caller Phone Number [phone] Patient Name [name] Patient DOB 07/25/1961 Call Type Message Only Information Provided Reason for Call Request for General Office Information Initial Comment Caller states he missed a call from office and was calling back. Additional Comment Office hours provided. Disp. Time Disposition Final User 10/15/2022 5:02:16 PM General Information Provided Yes Wynona Canes Call Closed By: Wynona Canes Transaction Date/Time: 10/15/2022 5:00:07 PM (ET

## 2022-10-21 NOTE — Telephone Encounter (Signed)
LMTCB; please note that at time of call, Tammy and all apps are booked for the week.

## 2022-10-21 NOTE — Telephone Encounter (Signed)
Sorry I have been out of office until today,  Will need ov this week for evaluation if increased dyspnea /hypoxia . Last ov was good and O2 was d/c   Please call patient and set up ov .

## 2022-10-21 NOTE — Telephone Encounter (Signed)
Can we try to call this patient again to get him set up for an office visit this week if possible.   Thank you

## 2022-10-23 ENCOUNTER — Other Ambulatory Visit (INDEPENDENT_AMBULATORY_CARE_PROVIDER_SITE_OTHER): Payer: Medicare Other

## 2022-10-23 DIAGNOSIS — R7989 Other specified abnormal findings of blood chemistry: Secondary | ICD-10-CM | POA: Diagnosis not present

## 2022-10-23 DIAGNOSIS — R0602 Shortness of breath: Secondary | ICD-10-CM

## 2022-10-23 LAB — BASIC METABOLIC PANEL
BUN: 17 mg/dL (ref 6–23)
CO2: 30 mEq/L (ref 19–32)
Calcium: 8.4 mg/dL (ref 8.4–10.5)
Chloride: 109 mEq/L (ref 96–112)
Creatinine, Ser: 1.08 mg/dL (ref 0.40–1.50)
GFR: 74.13 mL/min (ref >60.00)
Glucose, Bld: 78 mg/dL (ref 70–99)
Potassium: 4 mEq/L (ref 3.5–5.1)
Sodium: 148 mEq/L — ABNORMAL HIGH (ref 135–145)

## 2022-10-23 LAB — BRAIN NATRIURETIC PEPTIDE: Pro B Natriuretic peptide (BNP): 238 pg/mL — ABNORMAL HIGH (ref 0.0–100.0)

## 2022-10-23 NOTE — Telephone Encounter (Signed)
FYI: Patient is scheduled with Eric Form, NP 10/27/2022 at 11:30am as Rexene Edison, NP did not have any openings. Patient states that he is feeling much better after starting a fluid pill, but feels that he still needs to be evaluated.

## 2022-10-26 ENCOUNTER — Encounter: Payer: Self-pay | Admitting: Nurse Practitioner

## 2022-10-27 ENCOUNTER — Ambulatory Visit (INDEPENDENT_AMBULATORY_CARE_PROVIDER_SITE_OTHER): Payer: Medicare Other

## 2022-10-27 ENCOUNTER — Ambulatory Visit (INDEPENDENT_AMBULATORY_CARE_PROVIDER_SITE_OTHER): Payer: Medicare Other | Admitting: Acute Care

## 2022-10-27 ENCOUNTER — Telehealth: Payer: Self-pay | Admitting: Acute Care

## 2022-10-27 ENCOUNTER — Other Ambulatory Visit: Payer: Self-pay

## 2022-10-27 ENCOUNTER — Encounter: Payer: Self-pay | Admitting: Acute Care

## 2022-10-27 VITALS — BP 126/74 | HR 75 | Temp 98.2°F | Ht 65.0 in | Wt 177.4 lb

## 2022-10-27 DIAGNOSIS — F17210 Nicotine dependence, cigarettes, uncomplicated: Secondary | ICD-10-CM | POA: Diagnosis not present

## 2022-10-27 DIAGNOSIS — G4733 Obstructive sleep apnea (adult) (pediatric): Secondary | ICD-10-CM

## 2022-10-27 DIAGNOSIS — J441 Chronic obstructive pulmonary disease with (acute) exacerbation: Secondary | ICD-10-CM

## 2022-10-27 DIAGNOSIS — I509 Heart failure, unspecified: Secondary | ICD-10-CM | POA: Diagnosis not present

## 2022-10-27 DIAGNOSIS — J449 Chronic obstructive pulmonary disease, unspecified: Secondary | ICD-10-CM | POA: Diagnosis not present

## 2022-10-27 MED ORDER — FUROSEMIDE 20 MG PO TABS: 10.0000 mg | ORAL_TABLET | Freq: Every day | ORAL | 0 refills | Status: DC

## 2022-10-27 MED ORDER — ALBUTEROL SULFATE HFA 108 (90 BASE) MCG/ACT IN AERS: 1.0000 | INHALATION_SPRAY | Freq: Four times a day (QID) | RESPIRATORY_TRACT | 2 refills | Status: AC | PRN

## 2022-10-27 NOTE — Telephone Encounter (Signed)
Spoke with pt and notified him of Sarah's message. Pt stated understanding.

## 2022-10-27 NOTE — Telephone Encounter (Signed)
Estill Bamberg, can you please let the patient know his CXR has not been read. I ordered it as a stat, but I don't think it was done as a stat. Let him know I will call him with the results when it is read, and not to start any lasix until I have reviewed the CXR with him. Thanks

## 2022-10-27 NOTE — Progress Notes (Addendum)
History of Present Illness [name] is a 62 y.o. male  active smoker seen for pulmonary consult during hospitalization October 2023 for acute respiratory failure and presumed pneumonia/organizing pneumonia with abnormal CT chest with bilateral infiltrates Medical history significant for heart failure, coronary disease, diabetes and A-fib.He has been seen by Rexene Edison NP and Dr. Ander Slade.   Synopsis Pt was seen as consult by pulmonary service 07/2022 during admission for acute respiratory failure and presumed pneumonia/organizing pneumonia with abnormal CT chest with bilateral infiltrates. He was treated with aggressive IV antibiotics.  CT chest showed multifocal infiltrates right greater than left and was negative for PE.  Autoimmune and connective tissue workup was unrevealing. Except for mildly elevated rheumatoid arthritis and elevated CRP and sed rate.  HIV and viral panel were negative.  He underwent bronchoscopy October 14.  Cytology was negative for malignant cells.  Showed pulmonary macrophages and mixed inflammatory cells with scattered benign bronchial cells and squamous metaplasia.  Atypical bronchial cells favor reactive.  Cultures were negative.  Patient was started on empiric steroids for possible cryptogenic organizing pneumonia.  Patient had clinical improvement.  He also was discharged on oxygen.  Patient says he has stopped using his oxygen. Says he is starting to feel better.  Follow-up chest x-ray last visit showed resolution of infiltrates. Patient was slowly tapered off prednisone.  He also was started on Stiolto for presumed COPD. Patient was set up for PFTs that were completed   This showed moderate to severe COPD with an asthma overlap.  FEV1 was 50%, ratio 68 with FVC at 55%.  Postbronchodilator change of 31%.  Postbronchodilator FEV1 66%, ratio 87, FVC 57%.  DLCO 74%.  We discussed his results in detail.  Patient has underlying sleep apnea.  The patient says he is trying  to wear his CPAP.  CPAP supplies are old and mask is leaking terrible.  CPAP download shows 50 to 60% compliance over the last month.  Daily average usage is 5 hours.  Patient is on CPAP at 18 cm H2O.  AHI is 6.8/hour.  Patient says he cannot tell much difference with his Stiolto.  Continues to get some shortness of breath.  Tammy Parrett NP started him on Breztri 09/2022  he states he thinks this helps. But is definitely feeling better since he was in the hospital He participates in the lung cancer CT screening program.  Next scan is due in March 2024.    10/27/2022 Pt called to be seen for worsening dyspnea. He was seen by his PCP on 10/14/2022, who felt this was more of a heart failure issue.  BNP was 446, he was treated with Lasix 20 mg once daily for a week and BNP had come down to 238, which is still elevated. BMET showed stable Na,  K and renal function after diuresis. His weight is down about 5 pounds since he was treated with the lasix. He has been on Lasix since 2017 daily when he had an MI. When he was hospitalized 07/2022, he was taken off his lasix due to renal function and dehydration.  Pt. Sees heart failure clinic at [facility]. I have asked his daughter to call [facility] and get him in to be seen by cardiology asap to resume his diuretic therapy. He feels much better and is in no distress today. Oxygen saturations are 97% on RA and RR is 14. His CPAP Down Load shows poor compliance, and AHI of 12. He states he has a  poor mask seal and he had requested replacement of supplies form his DME so that he can replace the mask, tubing, filters etc. , but these have not yet been sent.  We have placed orders to the DME today, and we called and spoke with them to expedite the order.   Test Results: DG Chest >> pending  Down Load 08/28/2022-10/05/2022          Latest Ref Rng & Units 10/14/2022   12:23 PM 09/07/2022   10:33 AM 08/13/2022   12:51 PM  CBC  WBC 4.0 - 10.5 K/uL 9.4  8.0  22.1  Repeated and verified X2.   Hemoglobin 13.0 - 17.0 g/dL 13.0  14.3  14.4   Hematocrit 39.0 - 52.0 % 39.9  43.3  46.6   Platelets 150.0 - 400.0 K/uL 143.0  146.0  252.0        Latest Ref Rng & Units 10/23/2022    7:44 AM 10/14/2022   12:23 PM 08/13/2022   12:51 PM  BMP  Glucose 70 - 99 mg/dL 78  120  115   BUN 6 - 23 mg/dL '17  17  30   '$ Creatinine 0.40 - 1.50 mg/dL 1.08  1.21  1.52   Sodium 135 - 145 mEq/L 148  147  148   Potassium 3.5 - 5.1 mEq/L 4.0  4.4  4.5   Chloride 96 - 112 mEq/L 109  109  109   CO2 19 - 32 mEq/L 30  32  32   Calcium 8.4 - 10.5 mg/dL 8.4  8.4  8.7     BNP    Component Value Date/Time   BNP 896.2 (H) 07/31/2022 0548    ProBNP    Component Value Date/Time   PROBNP 238.0 (H) 10/23/2022 0744    PFT    Component Value Date/Time   FEV1PRE 1.55 10/07/2022 1012   FEV1POST 2.04 10/07/2022 1012   FVCPRE 2.27 10/07/2022 1012   FVCPOST 2.34 10/07/2022 1012   TLC 4.62 10/07/2022 1012   DLCOUNC 18.13 10/07/2022 1012   PREFEV1FVCRT 68 10/07/2022 1012   PSTFEV1FVCRT 87 10/07/2022 1012    DG Chest 2 View  Result Date: 10/07/2022 CLINICAL DATA:  Pneumonia EXAM: CHEST - 2 VIEW COMPARISON:  Chest radiograph dated 08/18/2022. FINDINGS: The heart size and mediastinal contours are within normal limits. Both lungs are clear. Median sternotomy wires and a left atrial appendage clip are redemonstrated. Degenerative changes are seen in the spine. IMPRESSION: No active cardiopulmonary disease. Electronically Signed   By: Zerita Boers M.D.   On: 10/07/2022 12:13     Past medical hx Past Medical History:  Diagnosis Date   Abrasion L FOREARM   Atrial fibrillation ([place])    a. initially occurring in post-op setting in 05/2016, started on Eliquis b. 08/2016: s/p clipping of atrial appendage at time of CABG --> Eliquis switched to Coumadin   CAD (coronary artery disease)    a. 05/2016: NSTEMI, cath showing 3v disease with CABG recommended once MRSA infection resolved. b.  08/2016: CABG with LIMA-LAD, SVG-D1, and SVG-RI.   Cancer ([place])    basal cell carcinoma of the skin   Diabetes mellitus without complication ([place])    IRJJOACZ(660.6)    Hematuria, microscopic "SINCE I WAS A KID"   Hyperlipidemia    Hypertension    Ischemic cardiomyopathy    a. 05/2016: echo w/ EF of 25-35% and akinesis of the basal-midinferolateral and inferior myocardium.   Lactose intolerance    Myocardial infarction (  [place])    Respiratory failure ([place])    Rotator cuff tear RIGHT   Tobacco use    a. quit in 05/2016 following MI     Social History   Tobacco Use   Smoking status: Some Days    Packs/day: 2.00    Years: 42.00    Total pack years: 84.00    Types: Cigarettes   Smokeless tobacco: Never   Tobacco comments:    0.5 packs of cigarettes smoked daily . ARJ 10/27/22  Substance Use Topics   Alcohol use: No   Drug use: No    Mr.Duval reports that he has been smoking cigarettes. He has a 84.00 pack-year smoking history. He has never used smokeless tobacco. He reports that he does not drink alcohol and does not use drugs.  Tobacco Cessation: Current every day smoker with an 84 pack year smoking history  Past surgical hx, Family hx, Social hx all reviewed.  Current Outpatient Medications on File Prior to Visit  Medication Sig   acetaminophen (TYLENOL) 500 MG tablet Take 1,000 mg by mouth every 6 (six) hours as needed for headache.   aspirin 81 MG chewable tablet Chew 1 tablet (81 mg total) by mouth daily.   atorvastatin (LIPITOR) 80 MG tablet Take 1 tablet (80 mg total) by mouth at bedtime.   Budeson-Glycopyrrol-Formoterol (BREZTRI AEROSPHERE) 160-9-4.8 MCG/ACT AERO Inhale 2 puffs into the lungs in the morning and at bedtime.   Budeson-Glycopyrrol-Formoterol (BREZTRI AEROSPHERE) 160-9-4.8 MCG/ACT AERO Inhale 2 puffs into the lungs in the morning and at bedtime.   ezetimibe (ZETIA) 10 MG tablet Take 1 tablet by mouth daily.   furosemide (LASIX) 20 MG tablet Take 1 tablet  (20 mg total) by mouth daily.   gabapentin (NEURONTIN) 300 MG capsule TAKE 2 CAPSULES BY MOUTH AT BEDTIME (Patient taking differently: Take 600 mg by mouth at bedtime.)   glucose blood test strip 1 each by Other route as needed for other. For one touch ultra meter. DX: E11.9   insulin isophane & regular human KwikPen (HUMULIN 70/30 KWIKPEN) (70-30) 100 UNIT/ML KwikPen Inject 50-60 Units into the skin See admin instructions. Please take 50 units every morning and 60 units with dinner   Insulin Pen Needle 32G X 4 MM MISC Use to inject Insulin daily. Dx: E11.9   Insulin Syringe-Needle U-100 (INSULIN SYRINGE .3CC/31GX5/16") 31G X 5/16" 0.3 ML MISC 1 Units by Does not apply route 2 (two) times daily.   JARDIANCE 25 MG TABS tablet Take 25 mg by mouth daily.   Lancets (ONETOUCH ULTRASOFT) lancets Use as instructed   metoCLOPramide (REGLAN) 5 MG tablet Take 1 tablet (5 mg total) by mouth daily as needed for nausea.   metoprolol succinate (TOPROL-XL) 100 MG 24 hr tablet Take 100 mg by mouth daily.   nicotine (NICODERM CQ - DOSED IN MG/24 HOURS) 21 mg/24hr patch Place 1 patch (21 mg total) onto the skin daily.   nitroGLYCERIN (NITROSTAT) 0.4 MG SL tablet Place 1 tablet (0.4 mg total) under the tongue every 5 (five) minutes as needed for chest pain.   sacubitril-valsartan (ENTRESTO) 24-26 MG Take 0.5 tablets by mouth 2 (two) times daily.   sitaGLIPtin (JANUVIA) 100 MG tablet Take 1 tablet by mouth daily.   traZODone (DESYREL) 50 MG tablet Take 100 mg by mouth at bedtime.   No current facility-administered medications on file prior to visit.     Allergies  Allergen Reactions   Latex Rash    Review Of Systems:  Constitutional:  No  weight loss, night sweats,  Fevers, chills, fatigue, or  lassitude.  HEENT:   No headaches,  Difficulty swallowing,  Tooth/dental problems, or  Sore throat,                No sneezing, itching, ear ache, nasal congestion, post nasal drip,   CV:  No chest pain,   Orthopnea, PND, swelling in lower extremities, anasarca, dizziness, palpitations, syncope.   GI  No heartburn, indigestion, abdominal pain, nausea, vomiting, diarrhea, change in bowel habits, loss of appetite, bloody stools.   Resp: + shortness of breath with exertion less at rest.  No excess mucus, no productive cough,  No non-productive cough,  No coughing up of blood.  No change in color of mucus.  No wheezing.  No chest wall deformity  Skin: no rash or lesions.  GU: no dysuria, change in color of urine, no urgency or frequency.  No flank pain, no hematuria   MS:  No joint pain or swelling.  No decreased range of motion.  No back pain.  Psych:  No change in mood or affect. No depression or anxiety.  No memory loss.   Vital Signs BP 126/74 (BP Location: Right Arm, Patient Position: Sitting, Cuff Size: Normal)   Pulse 75   Temp 98.2 F (36.8 C) (Oral)   Ht '5\' 5"'$  (1.651 m)   Wt 177 lb 6.4 oz (80.5 kg)   SpO2 97%   BMI 29.52 kg/m    Physical Exam:  General- No distress,  A&Ox3, pleasant ENT: No sinus tenderness, TM clear, pale nasal mucosa, no oral exudate,no post nasal drip, no LAN Cardiac: S1, S2, regular rate and rhythm, no murmur Chest: No wheeze/ rales/ dullness; no accessory muscle use, no nasal flaring, no sternal retractions Abd.: Soft Non-tender Ext: No clubbing cyanosis, edema Neuro:  normal strength Skin: No rashes, warm and dry Psych: normal mood and behavior   Assessment/Plan Dyspnea 2/2 acute on chronic heart failure Pt has been on diuretic daily since 2017 when he had an MI, but this was stopped at hospital d/c 07/2023 due to renal function and dehydration in setting of pneumonia. Plan I am glad you are feeling better.  We will check a CXR today I will call you with results>> [phone] ( Daughter Faythe Ghee)  I will send in Lasix   for use if you get short of breath again before you can be seen by cardiology. Please call your cardiologist in [facility] and ask to be seen as soon as possible for diuretic regimen. If you need to take the lasix, please call so we can check labs.  Follow up in March 2024 as is scheduled with Rexene Edison NP  COPD Plan Continue Breztri two puffs twice daily as you have been doing.  Rinse mouth after use We will send in an albuterol inhaler . Use 1-2 puffs every 6 hours as needed for shortness of breath or wheezing.  Follow up in March as is scheduled , or sooner if you need [place].   OSA  Non-compliant due to need of new face mask and supplies Significant mask leak and AHI of 10.2 on Down Load today Plan We will re-send an order for new equipment/ supplies  for your CPAP machine ( Mask, filters, hosing etc )  We will call [facility] at [phone] Continue on CPAP at bedtime  Goal is to wear for at least 6 hours each night for maximal clinical benefit. Continue  to work on weight loss, as the link between excess weight  and sleep apnea is well established.   Remember to establish a good bedtime routine, and work on sleep hygiene.  Limit daytime naps , avoid stimulants such as caffeine and nicotine close to bedtime, exercise daily to promote sleep quality, avoid heavy , spicy, fried , or rich foods before bed. Ensure adequate exposure to natural light during the day,establish a relaxing bedtime routine with a pleasant sleep environment ( Bedroom between 60 and 67 degrees, turn off bright lights , TV or device screens screens , consider black out curtains or white noise machines) Do not drive if sleepy. Remember to clean mask, tubing, filter, and reservoir once weekly with soapy water.  Follow up with Rexene Edison NP   In  March 2024 or before as needed.    We will re-evaluate Down Load after you have been resupplied. If AHI remains elevated ine leak is corrected, we will re-evaluate the set pressure of 18 cm H2O  I spent 45 minutes dedicated to the care of this patient on the date of this  encounter to include pre-visit review of records, face-to-face time with the patient discussing conditions above, post visit ordering of testing, clinical documentation with the electronic health record, making appropriate referrals as documented, and communicating necessary information to the patient's healthcare team.   Magdalen Spatz, NP 10/27/2022  3:32 PM

## 2022-10-27 NOTE — Patient Instructions (Addendum)
It is good to see you today.  I am glad you are feeling better.  We will check a CXR today I will call you with results>> [phone] ( Daughter Faythe Ghee)  I will send in Lasix   for use if you get short of breath again before you can be seen by cardiology. Please call your cardiologist in [facility] and ask to be seen as soon as possible for diuretic regimen. If you need to take the lasix, please call so we can check labs.  Continue Breztri two puffs twice daily as you have been doing.  Rinse mouth after use We will send in an albuterol inhaler . Use 1-2 puffs every 6 hours as needed for shortness of breath or wheezing.  Follow up in March as is scheduled , or sooner if you need [place].  We will re-send an order for new equipment/ supplies  for your CPAP machine ( Mask, filters, hosing etc )  We will call [facility] at [phone] Continue on CPAP at bedtime  Goal is to wear for at least 6 hours each night for maximal clinical benefit. Continue to work on weight loss, as the link between excess weight  and sleep apnea is well established.   Remember to establish a good bedtime routine, and work on sleep hygiene.  Limit daytime naps , avoid stimulants such as caffeine and nicotine close to bedtime, exercise daily to promote sleep quality, avoid heavy , spicy, fried , or rich foods before bed. Ensure adequate exposure to natural light during the day,establish a relaxing bedtime routine with a pleasant sleep environment ( Bedroom between 60 and 67 degrees, turn off bright lights , TV or device screens screens , consider black out curtains or white noise machines) Do not drive if sleepy. Remember to clean mask, tubing, filter, and reservoir once weekly with soapy water.  Follow up with Rexene Edison NP   In  March 2024 or before as needed.    We will re-evaluate Down Load after you have been resupplied. If AHI remains elevated ine leak is corrected, we will re-evaluate the set  pressure of 18 cm H2O Screening CT Chest due 12/2022.  Please contact office for sooner follow up if symptoms do not improve or worsen or seek emergency care

## 2022-10-28 ENCOUNTER — Other Ambulatory Visit: Payer: Self-pay | Admitting: Acute Care

## 2022-10-28 ENCOUNTER — Telehealth: Payer: Self-pay | Admitting: Acute Care

## 2022-10-28 DIAGNOSIS — H401131 Primary open-angle glaucoma, bilateral, mild stage: Secondary | ICD-10-CM | POA: Diagnosis not present

## 2022-10-28 DIAGNOSIS — I509 Heart failure, unspecified: Secondary | ICD-10-CM

## 2022-10-28 NOTE — Telephone Encounter (Signed)
Spoke with [name] and reviewed CXR as dictated by Judson Roch. [name] stated understanding and wanted Judson Roch to know that pt does have an upcoming appointment with cards on 11/09/22. Nothing further needed at this time.

## 2022-10-28 NOTE — Telephone Encounter (Signed)
Please call patient daughter,[phone] ( Daughter Faythe Ghee)  and let her know her father's  CXR was clear. He does not need to take any additional lasix at this time. He has the medication to take in the event he has additional shortness of breath before he sees his cardiologist in [place]. If he does have to take the lasix before seeing cardiology, he will need to have follow up BMET. Thanks

## 2022-10-30 ENCOUNTER — Telehealth: Payer: Self-pay | Admitting: Adult Health

## 2022-10-30 NOTE — Telephone Encounter (Signed)
Spoke with patients daughter advised I have called [facility] twice in regards two pts cpap supplies. No one is answering the phone. Will keep trying to call. I will leave encounter open

## 2022-10-30 NOTE — Telephone Encounter (Signed)
PT daughter calling. [facility] state they having a hard time getting Cpap equip for the PT.  [phone] is daughters numbers. Pls,. Call.   [place] [phone]

## 2022-11-03 DIAGNOSIS — A419 Sepsis, unspecified organism: Secondary | ICD-10-CM | POA: Diagnosis not present

## 2022-11-04 ENCOUNTER — Ambulatory Visit (INDEPENDENT_AMBULATORY_CARE_PROVIDER_SITE_OTHER): Payer: 59 | Admitting: Nurse Practitioner

## 2022-11-04 ENCOUNTER — Encounter: Payer: Self-pay | Admitting: Nurse Practitioner

## 2022-11-04 VITALS — BP 138/78 | HR 74 | Ht 65.0 in | Wt 182.0 lb

## 2022-11-04 DIAGNOSIS — M546 Pain in thoracic spine: Secondary | ICD-10-CM | POA: Diagnosis not present

## 2022-11-04 DIAGNOSIS — M6289 Other specified disorders of muscle: Secondary | ICD-10-CM | POA: Insufficient documentation

## 2022-11-04 DIAGNOSIS — A419 Sepsis, unspecified organism: Secondary | ICD-10-CM | POA: Diagnosis not present

## 2022-11-04 MED ORDER — TIZANIDINE HCL 2 MG PO TABS: 2.0000 mg | ORAL_TABLET | Freq: Every day | ORAL | 0 refills | Status: DC

## 2022-11-04 NOTE — Assessment & Plan Note (Signed)
Tizanidine 2 mg nightly as needed.  Sedation precautions reviewed

## 2022-11-04 NOTE — Progress Notes (Signed)
   Acute Office Visit  Subjective:     Patient ID: [name], male    DOB: 1961-05-13, 62 y.o.   MRN: [id number]  Chief Complaint  Patient presents with   Back Pain    Right side, under shoulder blade, no injury, no heavy lifting, no decreased ROM to shoulder     Patient is in today for back pain   States that it started approx 2-3 days ago. States that he changed his radiator on Sunday and this started around the same time States that it hurts most of the time. Described as a dull aching pain. States that nothing has helped it. Movement does not make it worse  States that he has tried salon pas lidocaine patches that has helped. Has tried tylneol that has not helped  Review of Systems  Constitutional:  Negative for chills and fever.  Respiratory:  Positive for shortness of breath (baseline).   Cardiovascular:  Negative for chest pain.  Musculoskeletal:  Positive for back pain.  Neurological:  Negative for tingling and weakness.        Objective:    BP 138/78   Pulse 74   Ht '5\' 5"'$  (1.651 m)   Wt 182 lb (82.6 kg)   SpO2 97%   BMI 30.29 kg/m    Physical Exam Vitals and nursing note reviewed.  Constitutional:      Appearance: Normal appearance.  Cardiovascular:     Rate and Rhythm: Normal rate and regular rhythm.     Heart sounds: Normal heart sounds.  Pulmonary:     Effort: Pulmonary effort is normal.     Breath sounds: Normal breath sounds.  Musculoskeletal:       Arms:     Thoracic back: Tenderness present. No bony tenderness.     Comments: Increased discomfort with oblique rotations  Neurological:     Mental Status: He is alert.     No results found for any visits on 11/04/22.      Assessment & Plan:   Problem List Items Addressed This Visit       Other   Acute right-sided thoracic back pain - Primary    Patient continues an over-the-counter lidocaine patches and Tylenol as needed.  Will also send a muscle relaxer and sedation precautions  reviewed      Relevant Medications   tiZANidine (ZANAFLEX) 2 MG tablet   Muscle tightness    Tizanidine 2 mg nightly as needed.  Sedation precautions reviewed      Relevant Medications   tiZANidine (ZANAFLEX) 2 MG tablet    Meds ordered this encounter  Medications   DISCONTD: tiZANidine (ZANAFLEX) 2 MG tablet    Sig: Take 1 tablet (2 mg total) by mouth at bedtime.    Dispense:  15 tablet    Refill:  0    Order Specific Question:   Supervising Provider    Answer:   Glori Bickers, MARNE A [[id number]]   tiZANidine (ZANAFLEX) 2 MG tablet    Sig: Take 1 tablet (2 mg total) by mouth at bedtime.    Dispense:  15 tablet    Refill:  0    Order Specific Question:   Supervising Provider    Answer:   Loura Pardon A [[id number]]    Return if symptoms worsen or fail to improve, for as scheduled.  Romilda Garret, NP

## 2022-11-04 NOTE — Assessment & Plan Note (Signed)
Patient continues an over-the-counter lidocaine patches and Tylenol as needed.  Will also send a muscle relaxer and sedation precautions reviewed

## 2022-11-04 NOTE — Patient Instructions (Addendum)
Nice to see you today I have sent in a muscle relaxer. It can cause sedation and dizziness.  Follow up if you do not improve  You can continue using the salon pas patches

## 2022-11-05 DIAGNOSIS — E119 Type 2 diabetes mellitus without complications: Secondary | ICD-10-CM | POA: Diagnosis not present

## 2022-11-05 NOTE — Telephone Encounter (Signed)
Patient's daughter called back with a direct fax for [facility] for CPAP supplies- please fax to [facility] # [phone]. Please advise and call patient when order is faxed over.

## 2022-11-05 NOTE — Telephone Encounter (Signed)
Order was placed on 10/27/22. Routing to [facility] so they can see the fax number of [phone] to fax this to for pt.

## 2022-11-06 NOTE — Telephone Encounter (Signed)
I have sent order to new fax # provided and received confirmation.  Form was received from them for TP to sign and send back.  I called pt & made him aware order has been resent.  Nothing further needed at this time.

## 2022-11-09 DIAGNOSIS — I5022 Chronic systolic (congestive) heart failure: Secondary | ICD-10-CM | POA: Diagnosis not present

## 2022-11-09 DIAGNOSIS — E785 Hyperlipidemia, unspecified: Secondary | ICD-10-CM | POA: Diagnosis not present

## 2022-11-09 DIAGNOSIS — I255 Ischemic cardiomyopathy: Secondary | ICD-10-CM | POA: Diagnosis not present

## 2022-11-09 DIAGNOSIS — I110 Hypertensive heart disease with heart failure: Secondary | ICD-10-CM | POA: Diagnosis not present

## 2022-11-09 DIAGNOSIS — I10 Essential (primary) hypertension: Secondary | ICD-10-CM | POA: Diagnosis not present

## 2022-11-10 ENCOUNTER — Encounter: Payer: Self-pay | Admitting: Nurse Practitioner

## 2022-11-10 ENCOUNTER — Ambulatory Visit (INDEPENDENT_AMBULATORY_CARE_PROVIDER_SITE_OTHER): Payer: 59 | Admitting: Nurse Practitioner

## 2022-11-10 VITALS — BP 112/58 | HR 83 | Ht 65.0 in | Wt 179.0 lb

## 2022-11-10 DIAGNOSIS — I5020 Unspecified systolic (congestive) heart failure: Secondary | ICD-10-CM

## 2022-11-10 DIAGNOSIS — Z79899 Other long term (current) drug therapy: Secondary | ICD-10-CM | POA: Diagnosis not present

## 2022-11-10 NOTE — Patient Instructions (Signed)
Nice to see you today You can ask your psychiatrist about trying magnesium to help with the legs and sleep. EKG looks good to me but I will forward it over to there office Follow up 3 months with me sooner if you need me

## 2022-11-10 NOTE — Assessment & Plan Note (Signed)
Patient was recently seen by cardiology.  They placed him on a as needed fluid pill based on weight and shortness of breath.

## 2022-11-10 NOTE — Assessment & Plan Note (Signed)
Patient had a increase in citalopram from 20 mg to 30 mg and psychiatry wanted to make sure QTc was in within normal limits.  EKG done today QTc within normal limits this was faxed via MyChart to psychiatry and cardiology.

## 2022-11-10 NOTE — Progress Notes (Signed)
   Established Patient Office Visit  Subjective   Patient ID: [name], male    DOB: January 27, 1961  Age: 62 y.o. MRN: [id number]  Chief Complaint  Patient presents with   Follow-up    HPI  High risk medication use: patient is followed by [facility] psychiatry and came to get an EKG to check on his ATC. They did up his citalopram and wanted a repeat EKG to recheck QTC. He is here for follow up Patient had switched his citalopram from 20 mg to 30 mg.  Wanted to make sure his QTc has not elongated.  Patient has no concerns or symptoms today  Of note she was seen on 11/04/2022 for muscle pain in his back and he was given a muscle relaxer.  Upon recheck today patient states he has resolved.  States he did take a muscle relaxer approximately 3 days and that helped.  No longer taking the muscle relaxer at this juncture   Review of Systems  Constitutional:  Negative for chills and fever.  Respiratory:  Negative for shortness of breath.   Cardiovascular:  Negative for chest pain.  Neurological:  Negative for headaches.  Psychiatric/Behavioral:  Negative for hallucinations and suicidal ideas. The patient has insomnia.       Objective:     BP (!) 112/58   Pulse 83   Ht '5\' 5"'$  (1.651 m)   Wt 179 lb (81.2 kg)   SpO2 98%   BMI 29.79 kg/m    Physical Exam Vitals and nursing note reviewed.  Constitutional:      Appearance: Normal appearance.  Cardiovascular:     Rate and Rhythm: Normal rate and regular rhythm.     Heart sounds: Normal heart sounds.  Pulmonary:     Effort: Pulmonary effort is normal.     Breath sounds: Normal breath sounds.  Neurological:     Mental Status: He is alert.      No results found for any visits on 11/10/22.    The ASCVD Risk score (Arnett DK, et al., 2019) failed to calculate for the following reasons:   The patient has a prior MI or stroke diagnosis    Assessment & Plan:   Problem List Items Addressed This Visit       Cardiovascular and  Mediastinum   HFrEF (heart failure with reduced ejection fraction) ([place]) - Primary    Patient was recently seen by cardiology.  They placed him on a as needed fluid pill based on weight and shortness of breath.      Relevant Orders   EKG 12-Lead (Completed)     Other   High risk medication use    Patient had a increase in citalopram from 20 mg to 30 mg and psychiatry wanted to make sure QTc was in within normal limits.  EKG done today QTc within normal limits this was faxed via MyChart to psychiatry and cardiology.       Return in about 3 months (around 02/09/2023) for Recheck on chronic conditions .    Romilda Garret, NP

## 2022-11-30 DIAGNOSIS — E119 Type 2 diabetes mellitus without complications: Secondary | ICD-10-CM | POA: Diagnosis not present

## 2022-11-30 DIAGNOSIS — Z794 Long term (current) use of insulin: Secondary | ICD-10-CM | POA: Diagnosis not present

## 2022-11-30 LAB — HEMOGLOBIN A1C: Hemoglobin A1C: 6.1

## 2022-12-04 DIAGNOSIS — A419 Sepsis, unspecified organism: Secondary | ICD-10-CM | POA: Diagnosis not present

## 2022-12-05 DIAGNOSIS — A419 Sepsis, unspecified organism: Secondary | ICD-10-CM | POA: Diagnosis not present

## 2022-12-05 DIAGNOSIS — E119 Type 2 diabetes mellitus without complications: Secondary | ICD-10-CM | POA: Diagnosis not present

## 2022-12-09 DIAGNOSIS — E113412 Type 2 diabetes mellitus with severe nonproliferative diabetic retinopathy with macular edema, left eye: Secondary | ICD-10-CM | POA: Diagnosis not present

## 2022-12-15 ENCOUNTER — Telehealth: Payer: Self-pay | Admitting: Nurse Practitioner

## 2022-12-15 NOTE — Telephone Encounter (Signed)
Prescription Request  12/15/2022  Is this a "Controlled Substance" medicine? No  LOV: 11/10/2022  What is the name of the medication or equipment? citalopram (CELEXA) 20 MG tablet   Have you contacted your pharmacy to request a refill? YES Pharmacy called to refill meds, pharmacy stated they have 2 different dosages for the meds, '10mg'$  & '20mg'$ . Pharmacy stated they'd call pt to clerify the correct dosage he's taking   Which pharmacy would you like this sent to?    [place], [place] [id number] [place] [id number] [place] [address] Phone: [phone] Fax: [phone]     Patient notified that their request is being sent to the clinical staff for review and that they should receive a response within 2 business days.   Please advise at Mobile [phone] (mobile)

## 2022-12-16 NOTE — Telephone Encounter (Signed)
Patient is followed by [facility] Psychiatry. They will need to contact that prescriber

## 2022-12-16 NOTE — Telephone Encounter (Signed)
Called pharmacy and they said they would fax over to correct provider.

## 2022-12-23 ENCOUNTER — Other Ambulatory Visit: Payer: Self-pay | Admitting: Nurse Practitioner

## 2022-12-23 DIAGNOSIS — H26492 Other secondary cataract, left eye: Secondary | ICD-10-CM | POA: Diagnosis not present

## 2022-12-28 ENCOUNTER — Ambulatory Visit (INDEPENDENT_AMBULATORY_CARE_PROVIDER_SITE_OTHER): Payer: 59

## 2022-12-28 ENCOUNTER — Ambulatory Visit (INDEPENDENT_AMBULATORY_CARE_PROVIDER_SITE_OTHER): Payer: 59 | Admitting: Podiatry

## 2022-12-28 VITALS — BP 130/70 | HR 79

## 2022-12-28 DIAGNOSIS — M2042 Other hammer toe(s) (acquired), left foot: Secondary | ICD-10-CM

## 2022-12-28 DIAGNOSIS — E1142 Type 2 diabetes mellitus with diabetic polyneuropathy: Secondary | ICD-10-CM | POA: Diagnosis not present

## 2022-12-28 DIAGNOSIS — M79609 Pain in unspecified limb: Secondary | ICD-10-CM | POA: Diagnosis not present

## 2022-12-28 DIAGNOSIS — M21621 Bunionette of right foot: Secondary | ICD-10-CM | POA: Diagnosis not present

## 2022-12-28 DIAGNOSIS — L84 Corns and callosities: Secondary | ICD-10-CM

## 2022-12-28 DIAGNOSIS — B351 Tinea unguium: Secondary | ICD-10-CM

## 2022-12-28 NOTE — Patient Instructions (Signed)
More silicone pads can be purchased from:  [email or web address]  

## 2022-12-29 ENCOUNTER — Encounter: Payer: Self-pay | Admitting: Podiatry

## 2022-12-29 NOTE — Progress Notes (Signed)
  Subjective:  Patient ID: [name], male    DOB: Aug 03, 1961,  MRN: [id number]  Chief Complaint  Patient presents with   Nail Problem    "Toe clipping and examination, I have some concerns.  The Hammer toe that has buckled up and is rubbing my shoe on the left foot.  I have a blister on my right foot."    62 y.o. male presents with the above complaint. History confirmed with patient.  He returns for follow-up.  His blood sugar is well-controlled and A1c checked recently it is 6.1%.  His nails are thickened and elongated and his calluses have returned and are causing discomfort.  He notes blistering and sore spots on the right foot behind the fifth toe and left fourth toe  Objective:  Physical Exam: warm, good capillary refill, no trophic changes or ulcerative lesions, normal DP and PT pulses, and he has an abnormal sensory exam with loss of protective sensation. Left Foot: dystrophic yellowed discolored nail plates with subungual debris and fourth PIPJ callus Right Foot: dystrophic yellowed discolored nail plates with subungual debris and lateral fifth metatarsal there is a blood blister present, superficial in nature no signs of infection or ulceration deep to this  Radiographs of both feet taken today show no osteomyelitis of the areas of concern of the fifth metatarsal on the right foot or left fourth toe  Assessment:   1. Bunionette of right foot   2. Hammer toe of left foot   3. Callus   4. Pain due to onychomycosis of nail   5. Type 2 diabetes mellitus with peripheral neuropathy (HCC)      Plan:  Patient was evaluated and treated and all questions answered.  Patient educated on diabetes. Discussed proper diabetic foot care and discussed risks and complications of disease. Educated patient in depth on reasons to return to the office immediately should he/she discover anything concerning or new on the feet. All questions answered. Discussed proper shoes as well.   I  reviewed his radiographs and discussed with him the prominent pressure areas we also discussed shoe gear to offload areas around the hammertoe and fifth metatarsal.  He is at risk of ulceration in these areas due to his neuropathy.  Luckily his diabetes is now very well-controlled.  I recommended offloading with silicone padding the pressure were dispensed today.  Discussed the etiology and treatment options for the condition in detail with the patient. Educated patient on the topical and oral treatment options for mycotic nails. Recommended debridement of the nails today. Sharp and mechanical debridement performed of all painful and mycotic nails today. Nails debrided in length and thickness using a nail nipper to level of comfort. Discussed treatment options including appropriate shoe gear. Follow up as needed for painful nails.  All symptomatic hyperkeratoses were safely debrided with a sterile #15 blade to patient's level of comfort without incident. We discussed preventative and palliative care of these lesions including supportive and accommodative shoegear, padding, prefabricated and custom molded accommodative orthoses, use of a pumice stone and lotions/creams daily.   Return in about 3 months (around 03/30/2023) for at risk diabetic foot care.

## 2023-01-02 DIAGNOSIS — A419 Sepsis, unspecified organism: Secondary | ICD-10-CM | POA: Diagnosis not present

## 2023-01-03 DIAGNOSIS — A419 Sepsis, unspecified organism: Secondary | ICD-10-CM | POA: Diagnosis not present

## 2023-01-04 ENCOUNTER — Ambulatory Visit
Admission: RE | Admit: 2023-01-04 | Discharge: 2023-01-04 | Disposition: A | Discharge status: Home / Self Care | Payer: 59 | Source: Ambulatory Visit | Attending: Nurse Practitioner | Admitting: Nurse Practitioner

## 2023-01-04 DIAGNOSIS — F17210 Nicotine dependence, cigarettes, uncomplicated: Secondary | ICD-10-CM | POA: Diagnosis not present

## 2023-01-04 DIAGNOSIS — Z87891 Personal history of nicotine dependence: Secondary | ICD-10-CM | POA: Diagnosis not present

## 2023-01-04 DIAGNOSIS — E119 Type 2 diabetes mellitus without complications: Secondary | ICD-10-CM | POA: Diagnosis not present

## 2023-01-06 ENCOUNTER — Ambulatory Visit: Payer: Medicare Other | Admitting: Adult Health

## 2023-01-06 ENCOUNTER — Other Ambulatory Visit: Payer: Self-pay

## 2023-01-06 DIAGNOSIS — F17210 Nicotine dependence, cigarettes, uncomplicated: Secondary | ICD-10-CM

## 2023-01-06 DIAGNOSIS — Z87891 Personal history of nicotine dependence: Secondary | ICD-10-CM

## 2023-01-11 ENCOUNTER — Other Ambulatory Visit: Payer: Self-pay | Admitting: *Deleted

## 2023-01-12 ENCOUNTER — Ambulatory Visit (INDEPENDENT_AMBULATORY_CARE_PROVIDER_SITE_OTHER): Payer: 59 | Admitting: Adult Health

## 2023-01-12 ENCOUNTER — Encounter: Payer: Self-pay | Admitting: Adult Health

## 2023-01-12 VITALS — BP 120/72 | HR 75 | Temp 97.9°F | Ht 65.0 in | Wt 172.6 lb

## 2023-01-12 DIAGNOSIS — G4733 Obstructive sleep apnea (adult) (pediatric): Secondary | ICD-10-CM | POA: Diagnosis not present

## 2023-01-12 DIAGNOSIS — J449 Chronic obstructive pulmonary disease, unspecified: Secondary | ICD-10-CM | POA: Diagnosis not present

## 2023-01-12 DIAGNOSIS — Z720 Tobacco use: Secondary | ICD-10-CM

## 2023-01-12 DIAGNOSIS — J8489 Other specified interstitial pulmonary diseases: Secondary | ICD-10-CM | POA: Diagnosis not present

## 2023-01-12 NOTE — Progress Notes (Unsigned)
@Patient  ID: [name], male    DOB: Sep 26, 1961, 62 y.o.   MRN: [id number]  Chief Complaint  Patient presents with   Follow-up    Referring provider: Michela Pitcher, NP  HPI: 62 year old male active smoker seen for pulmonary consult during hospitalization October 2023 for acute respiratory failure presumed pneumonia/organizing pneumonia with abnormal CT chest with bilateral infiltrates.  Followed for COPD, sleep apnea and chronic respiratory failure Medical history significant for heart failure, coronary artery disease, diabetes and atrial fibrillation Participates in the lung cancer screening program  TEST/EVENTS :  10/12>> CTA chest: Multifocal infiltrates-right> left.  No PE. 10/13>> ANCA profile: Neg  10/13>> ANA: Neg  10/13>> RA factor: 16.2  10/13>> CRP: 29.7, ESR 57    Significant microbiology data: 10/12>> HIV negative 10/12>> COVID/influenza PCR: Negative 10/12>> respiratory virus panel: Negative 10/12>> blood culture: No growth 10/13>> sputum culture: neg  10/14>> BAL culture: No growth 10/14>> BAL AFB smear: Neg  10/14>> BAL AFB culture: Neg  10/14>> BAL fungal culture: Neg    Pathology data 10/14>> BAL cytology: Negative malignant cells, pulmonary macrophages and mixed inflammatory cells with scattered benign bronchial cells and squamous metaplasia, atypical bronchial cells favor reactive   2D echo EF at 60 to 65%, moderate LVH, mildly elevated pulmonary artery systolic pressure  PFTs [id number] Moderate to severe COPD with an asthma overlap. FEV1 was 50%, ratio 68 with FVC at 55%. Postbronchodilator change of 31%. Postbronchodilator FEV1 66%, ratio 87, FVC 57%. DLCO 74%.   01/12/2023    Allergies  Allergen Reactions   Latex Rash    Immunization History  Administered Date(s) Administered   Influenza,inj,quad, With Preservative 09/21/2019   PNEUMOCOCCAL CONJUGATE-20 10/29/2021   Pneumococcal Polysaccharide-23 09/26/2019    Past Medical History:   Diagnosis Date   Abrasion L FOREARM   Atrial fibrillation ([place])    a. initially occurring in post-op setting in 05/2016, started on Eliquis b. 08/2016: s/p clipping of atrial appendage at time of CABG --> Eliquis switched to Coumadin   CAD (coronary artery disease)    a. 05/2016: NSTEMI, cath showing 3v disease with CABG recommended once MRSA infection resolved. b. 08/2016: CABG with LIMA-LAD, SVG-D1, and SVG-RI.   Cancer ([place])    basal cell carcinoma of the skin   Diabetes mellitus without complication ([place])    [id number])    Hematuria, microscopic "SINCE I WAS A KID"   Hyperlipidemia    Hypertension    Ischemic cardiomyopathy    a. 05/2016: echo w/ EF of 25-35% and akinesis of the basal-midinferolateral and inferior myocardium.   Lactose intolerance    Myocardial infarction (HCC)    Respiratory failure (HCC)    Rotator cuff tear RIGHT   Tobacco use    a. quit in 05/2016 following MI    Tobacco History: Social History   Tobacco Use  Smoking Status Some Days   Packs/day: 2.00   Years: 42.00   Additional pack years: 0.00   Total pack years: 84.00   Types: Cigarettes  Smokeless Tobacco Never  Tobacco Comments   Quit for 3 weeks.  Smoking a pack per day.  Trying to cut back.  01/12/2023 hfb   Ready to quit: Not Answered Counseling given: Not Answered Tobacco comments: Quit for 3 weeks.  Smoking a pack per day.  Trying to cut back.  01/12/2023 hfb   Outpatient Medications Prior to Visit  Medication Sig Dispense Refill   acetaminophen (TYLENOL) 500 MG tablet Take 1,000 mg by mouth every  6 (six) hours as needed for headache.     albuterol (VENTOLIN HFA) 108 (90 Base) MCG/ACT inhaler Inhale 1-2 puffs into the lungs every 6 (six) hours as needed for wheezing or shortness of breath. 8 g 2   aspirin 81 MG chewable tablet Chew 1 tablet (81 mg total) by mouth daily. 30 tablet 3   atorvastatin (LIPITOR) 80 MG tablet Take 1 tablet (80 mg total) by mouth at bedtime. 30 tablet 11    Budeson-Glycopyrrol-Formoterol (BREZTRI AEROSPHERE) 160-9-4.8 MCG/ACT AERO Inhale 2 puffs into the lungs in the morning and at bedtime. 5.9 g 0   citalopram (CELEXA) 20 MG tablet Take 30 mg by mouth daily.     ezetimibe (ZETIA) 10 MG tablet Take 1 tablet by mouth daily.     furosemide (LASIX) 20 MG tablet Take 1 tablet (20 mg total) by mouth daily. (Patient taking differently: Take 20 mg by mouth daily as needed.) 7 tablet 0   gabapentin (NEURONTIN) 300 MG capsule TAKE 2 CAPSULES BY MOUTH AT BEDTIME 60 capsule 5   glucose blood test strip 1 each by Other route as needed for other. For one touch ultra meter. DX: E11.9 100 each 3   insulin isophane & regular human KwikPen (HUMULIN 70/30 KWIKPEN) (70-30) 100 UNIT/ML KwikPen Inject 50-60 Units into the skin See admin instructions. Please take 50 units every morning and 60 units with dinner     Insulin Pen Needle 32G X 4 MM MISC Use to inject Insulin daily. Dx: E11.9 100 each 3   Insulin Syringe-Needle U-100 (INSULIN SYRINGE .3CC/31GX5/16") 31G X 5/16" 0.3 ML MISC 1 Units by Does not apply route 2 (two) times daily. 100 each 2   JARDIANCE 25 MG TABS tablet Take 25 mg by mouth daily.     Lancets (ONETOUCH ULTRASOFT) lancets Use as instructed 100 each 12   metoprolol succinate (TOPROL-XL) 100 MG 24 hr tablet Take 100 mg by mouth daily.     nitroGLYCERIN (NITROSTAT) 0.4 MG SL tablet Place 1 tablet (0.4 mg total) under the tongue every 5 (five) minutes as needed for chest pain. 25 tablet 3   sacubitril-valsartan (ENTRESTO) 24-26 MG Take 0.5 tablets by mouth 2 (two) times daily.     sitaGLIPtin (JANUVIA) 100 MG tablet Take 1 tablet by mouth daily.     traZODone (DESYREL) 100 MG tablet Take 100 mg by mouth at bedtime.     nicotine (NICODERM CQ - DOSED IN MG/24 HOURS) 21 mg/24hr patch Place 1 patch (21 mg total) onto the skin daily. (Patient not taking: Reported on 01/12/2023) 28 patch 0   metoCLOPramide (REGLAN) 5 MG tablet Take 1 tablet (5 mg total) by  mouth daily as needed for nausea. (Patient not taking: Reported on 01/12/2023) 30 tablet 2   tiZANidine (ZANAFLEX) 2 MG tablet Take 1 tablet (2 mg total) by mouth at bedtime. (Patient not taking: Reported on 01/12/2023) 15 tablet 0   No facility-administered medications prior to visit.     Review of Systems:   Constitutional:   No  weight loss, night sweats,  Fevers, chills, fatigue, or  lassitude.  HEENT:   No headaches,  Difficulty swallowing,  Tooth/dental problems, or  Sore throat,                No sneezing, itching, ear ache, nasal congestion, post nasal drip,   CV:  No chest pain,  Orthopnea, PND, swelling in lower extremities, anasarca, dizziness, palpitations, syncope.   GI  No heartburn, indigestion, abdominal  pain, nausea, vomiting, diarrhea, change in bowel habits, loss of appetite, bloody stools.   Resp: No shortness of breath with exertion or at rest.  No excess mucus, no productive cough,  No non-productive cough,  No coughing up of blood.  No change in color of mucus.  No wheezing.  No chest wall deformity  Skin: no rash or lesions.  GU: no dysuria, change in color of urine, no urgency or frequency.  No flank pain, no hematuria   MS:  No joint pain or swelling.  No decreased range of motion.  No back pain.    Physical Exam  BP 120/72 (BP Location: Left Arm, Patient Position: Sitting, Cuff Size: Normal)   Pulse 75   Temp 97.9 F (36.6 C) (Oral)   Ht 5\' 5"  (1.651 m)   Wt 172 lb 9.6 oz (78.3 kg)   SpO2 98%   BMI 28.72 kg/m   GEN: A/Ox3; pleasant , NAD, well nourished    HEENT:  [place]/AT,  EACs-clear, TMs-wnl, NOSE-clear, THROAT-clear, no lesions, no postnasal drip or exudate noted.   NECK:  Supple w/ fair ROM; no JVD; normal carotid impulses w/o bruits; no thyromegaly or nodules palpated; no lymphadenopathy.    RESP  Clear  P & A; w/o, wheezes/ rales/ or rhonchi. no accessory muscle use, no dullness to percussion  CARD:  RRR, no m/r/g, no peripheral edema,  pulses intact, no cyanosis or clubbing.  GI:   Soft & nt; nml bowel sounds; no organomegaly or masses detected.   Musco: Warm bil, no deformities or joint swelling noted.   Neuro: alert, no focal deficits noted.    Skin: Warm, no lesions or rashes    Lab Results:  CBC    Component Value Date/Time   WBC 9.4 10/14/2022 1223   RBC 4.49 10/14/2022 1223   HGB 13.0 10/14/2022 1223   HCT 39.9 10/14/2022 1223   PLT 143.0 (L) 10/14/2022 1223   MCV 89.0 10/14/2022 1223   MCH 27.8 08/03/2022 0234   MCHC 32.5 10/14/2022 1223   RDW 17.0 (H) 10/14/2022 1223   LYMPHSABS 0.8 07/31/2022 1018   MONOABS 0.9 07/31/2022 1018   EOSABS 0.0 07/31/2022 1018   BASOSABS 0.0 07/31/2022 1018    BMET    Component Value Date/Time   NA 148 (H) 10/23/2022 0744   NA 146 03/08/2017 0000   K 4.0 10/23/2022 0744   CL 109 10/23/2022 0744   CO2 30 10/23/2022 0744   GLUCOSE 78 10/23/2022 0744   BUN 17 10/23/2022 0744   BUN 14 03/08/2017 0000   CREATININE 1.08 10/23/2022 0744   CREATININE 1.18 09/21/2016 1605   CALCIUM 8.4 10/23/2022 0744   GFRNONAA 40 (L) 08/03/2022 0234   GFRAA >60 12/04/2019 1820    BNP    Component Value Date/Time   BNP 896.2 (H) 07/31/2022 0548    ProBNP    Component Value Date/Time   PROBNP 238.0 (H) 10/23/2022 0744    Imaging: CT CHEST LUNG CA SCREEN LOW DOSE W/O CM  Result Date: 01/05/2023 CLINICAL DATA:  61 pack-year smoking history/current smoker EXAM: CT CHEST WITHOUT CONTRAST LOW-DOSE FOR LUNG CANCER SCREENING TECHNIQUE: Multidetector CT imaging of the chest was performed following the standard protocol without IV contrast. RADIATION DOSE REDUCTION: This exam was performed according to the departmental dose-optimization program which includes automated exposure control, adjustment of the mA and/or kV according to patient size and/or use of iterative reconstruction technique. COMPARISON:  07/30/2022 CTA chest. 01/01/2022 lung cancer screening CT.  FINDINGS:  Cardiovascular: Aortic atherosclerosis. Mild cardiomegaly, without pericardial effusion. Median sternotomy for CABG. Mediastinum/Nodes: No mediastinal or hilar adenopathy, given limitations of unenhanced CT. Esophageal fluid level on 38/2. Lungs/Pleura: No pleural fluid. Mild centrilobular emphysema. Minimal motion degradation throughout. No change in a left lower lobe pulmonary nodule of volume derived equivalent diameter 2.8 mm. Upper Abdomen: Nonspecific caudate lobe enlargement. Normal imaged portions of the spleen, stomach, pancreas, gallbladder, adrenal glands, kidneys. Musculoskeletal: Remote upper left rib fractures including nonunited first left rib fracture versus less likely developmental defect. Median sternotomy with nonunion of the inferior portion of the sternotomy defect. Lower thoracic spondylosis. IMPRESSION: 1. Lung-RADS 2, benign appearance or behavior. Continue annual screening with low-dose chest CT without contrast in 12 months. 2. Esophageal air fluid level suggests dysmotility or gastroesophageal reflux. 3. Aortic Atherosclerosis (ICD10-I70.0) and Emphysema (ICD10-J43.9). Electronically Signed   By: Abigail Miyamoto M.D.   On: 01/05/2023 16:24  DG Foot Complete Right  Result Date: 12/28/2022 Please see detailed radiograph report in office note.  DG Foot Complete Left  Result Date: 12/28/2022 Please see detailed radiograph report in office note.        Latest Ref Rng & Units 10/07/2022   10:12 AM 06/17/2016    2:22 PM  PFT Results  FVC-Pre L 2.27  2.03   FVC-Predicted Pre % 55  47   FVC-Post L 2.34  1.86   FVC-Predicted Post % 57  43   Pre FEV1/FVC % % 68  76   Post FEV1/FCV % % 87  81   FEV1-Pre L 1.55  1.54   FEV1-Predicted Pre % 50  46   FEV1-Post L 2.04  1.51   DLCO uncorrected ml/min/mmHg 18.13    DLCO UNC% % 74    DLCO corrected ml/min/mmHg 18.29    DLCO COR %Predicted % 75    DLVA Predicted % 115    TLC L 4.62    TLC % Predicted % 74    RV % Predicted % 72       No results found for: "NITRICOXIDE"      Assessment & Plan:   No problem-specific Assessment & Plan notes found for this encounter.     Rexene Edison, NP 01/12/2023

## 2023-01-12 NOTE — Patient Instructions (Addendum)
Work on not smoking Continue on [employer] 2 puffs Twice daily, rinse after use.  Albuterol inhaler As needed   Activity as tolerated Restart on CPAP At bedtime  Try Dream wear nasal mask .  Change CPAP auto 14 to 18 cmH2O.  Continue with Lung Cancer CT chest screening (Due March 2025 )  Follow up in 3 months with Dr. Ander Slade or Diamantina Edinger NP with and As needed   Please contact office for sooner follow up if symptoms do not improve or worsen or seek emergency care

## 2023-01-14 DIAGNOSIS — G250 Essential tremor: Secondary | ICD-10-CM | POA: Diagnosis not present

## 2023-01-14 DIAGNOSIS — E1142 Type 2 diabetes mellitus with diabetic polyneuropathy: Secondary | ICD-10-CM | POA: Diagnosis not present

## 2023-01-14 DIAGNOSIS — G2581 Restless legs syndrome: Secondary | ICD-10-CM | POA: Diagnosis not present

## 2023-01-14 DIAGNOSIS — Z8739 Personal history of other diseases of the musculoskeletal system and connective tissue: Secondary | ICD-10-CM | POA: Diagnosis not present

## 2023-01-14 DIAGNOSIS — Z794 Long term (current) use of insulin: Secondary | ICD-10-CM | POA: Diagnosis not present

## 2023-01-14 DIAGNOSIS — R2689 Other abnormalities of gait and mobility: Secondary | ICD-10-CM | POA: Diagnosis not present

## 2023-01-14 DIAGNOSIS — E1165 Type 2 diabetes mellitus with hyperglycemia: Secondary | ICD-10-CM | POA: Diagnosis not present

## 2023-01-14 NOTE — Assessment & Plan Note (Signed)
Smoking cessation discussed.  Continue with lung cancer CT screening program

## 2023-01-14 NOTE — Assessment & Plan Note (Signed)
Patient is encouraged on CPAP compliance.  Will change CPAP pressure for comfort.  Change to auto CPAP 14 to 18 cm H2O.  Changed to DreamWear nasal mask

## 2023-01-14 NOTE — Assessment & Plan Note (Signed)
Treated for organizing pneumonia and October 2023.  Steroid responsive.  Most recent CT chest March 2024 showed no residual infiltrates.

## 2023-01-14 NOTE — Assessment & Plan Note (Signed)
Moderate to severe COPD with asthma overlap.  Continue on Breztri twice daily.  Continue with the lung cancer screening CT program.  Smoking cessation is key.  Activity as tolerated.  Plan  Patient Instructions  Work on not smoking Continue on Breztri 2 puffs Twice daily, rinse after use.  Albuterol inhaler As needed   Activity as tolerated Restart on CPAP At bedtime  Try Dream wear nasal mask .  Change CPAP auto 14 to 18 cmH2O.  Continue with Lung Cancer CT chest screening (Due March 2025 )  Follow up in 3 months with Dr. Ander Slade or Legrande Hao NP with and As needed   Please contact office for sooner follow up if symptoms do not improve or worsen or seek emergency care

## 2023-01-22 DIAGNOSIS — L814 Other melanin hyperpigmentation: Secondary | ICD-10-CM | POA: Diagnosis not present

## 2023-01-22 DIAGNOSIS — Z85828 Personal history of other malignant neoplasm of skin: Secondary | ICD-10-CM | POA: Diagnosis not present

## 2023-01-22 DIAGNOSIS — Z08 Encounter for follow-up examination after completed treatment for malignant neoplasm: Secondary | ICD-10-CM | POA: Diagnosis not present

## 2023-01-22 DIAGNOSIS — D485 Neoplasm of uncertain behavior of skin: Secondary | ICD-10-CM | POA: Diagnosis not present

## 2023-01-22 DIAGNOSIS — L821 Other seborrheic keratosis: Secondary | ICD-10-CM | POA: Diagnosis not present

## 2023-01-22 DIAGNOSIS — C44319 Basal cell carcinoma of skin of other parts of face: Secondary | ICD-10-CM | POA: Diagnosis not present

## 2023-01-22 DIAGNOSIS — L570 Actinic keratosis: Secondary | ICD-10-CM | POA: Diagnosis not present

## 2023-01-22 DIAGNOSIS — D225 Melanocytic nevi of trunk: Secondary | ICD-10-CM | POA: Diagnosis not present

## 2023-01-22 DIAGNOSIS — C44519 Basal cell carcinoma of skin of other part of trunk: Secondary | ICD-10-CM | POA: Diagnosis not present

## 2023-01-25 ENCOUNTER — Telehealth: Payer: Self-pay | Admitting: Nurse Practitioner

## 2023-01-25 NOTE — Telephone Encounter (Signed)
EKG printed and faxed over to [facility]

## 2023-01-25 NOTE — Telephone Encounter (Signed)
Amy from [facility] called over and stated that they need a copy of [place] moist recent EKG. It can be faxed over to [phone]. Thank you!

## 2023-02-02 DIAGNOSIS — A419 Sepsis, unspecified organism: Secondary | ICD-10-CM | POA: Diagnosis not present

## 2023-02-03 DIAGNOSIS — A419 Sepsis, unspecified organism: Secondary | ICD-10-CM | POA: Diagnosis not present

## 2023-02-04 DIAGNOSIS — E119 Type 2 diabetes mellitus without complications: Secondary | ICD-10-CM | POA: Diagnosis not present

## 2023-02-05 DIAGNOSIS — C44629 Squamous cell carcinoma of skin of left upper limb, including shoulder: Secondary | ICD-10-CM | POA: Diagnosis not present

## 2023-02-05 DIAGNOSIS — D485 Neoplasm of uncertain behavior of skin: Secondary | ICD-10-CM | POA: Diagnosis not present

## 2023-02-09 ENCOUNTER — Ambulatory Visit (INDEPENDENT_AMBULATORY_CARE_PROVIDER_SITE_OTHER): Payer: 59 | Admitting: Nurse Practitioner

## 2023-02-09 ENCOUNTER — Ambulatory Visit (INDEPENDENT_AMBULATORY_CARE_PROVIDER_SITE_OTHER)
Admission: RE | Admit: 2023-02-09 | Discharge: 2023-02-09 | Disposition: A | Discharge status: Home / Self Care | Payer: 59 | Source: Ambulatory Visit | Attending: Nurse Practitioner | Admitting: Nurse Practitioner

## 2023-02-09 ENCOUNTER — Encounter: Payer: Self-pay | Admitting: Nurse Practitioner

## 2023-02-09 VITALS — BP 108/62 | HR 62 | Temp 98.0°F | Resp 16 | Ht 65.0 in | Wt 179.0 lb

## 2023-02-09 DIAGNOSIS — I5023 Acute on chronic systolic (congestive) heart failure: Secondary | ICD-10-CM | POA: Diagnosis not present

## 2023-02-09 DIAGNOSIS — I5020 Unspecified systolic (congestive) heart failure: Secondary | ICD-10-CM | POA: Diagnosis not present

## 2023-02-09 DIAGNOSIS — J439 Emphysema, unspecified: Secondary | ICD-10-CM | POA: Diagnosis not present

## 2023-02-09 DIAGNOSIS — R0689 Other abnormalities of breathing: Secondary | ICD-10-CM

## 2023-02-09 DIAGNOSIS — J9811 Atelectasis: Secondary | ICD-10-CM | POA: Diagnosis not present

## 2023-02-09 DIAGNOSIS — R059 Cough, unspecified: Secondary | ICD-10-CM | POA: Diagnosis not present

## 2023-02-09 DIAGNOSIS — R051 Acute cough: Secondary | ICD-10-CM

## 2023-02-09 NOTE — Assessment & Plan Note (Signed)
Patient is followed by pulmonology.  Recently seen by her not currently on any oxygen.  Continue following with pulmonology as recommended

## 2023-02-09 NOTE — Patient Instructions (Signed)
Nice to see you today I will be in touch with the xray once I have it. Follow up with me in 3 months for your physical and labs, sooner if you need me   You can take some mucinex over the counter to help thin out the phlegm.

## 2023-02-09 NOTE — Assessment & Plan Note (Signed)
Cough for approximate 1 week.  Patient is low threshold for treatment with antibiotics.  Pending chest x-ray today.  He can use Mucinex as needed to help loosen up the phlegm

## 2023-02-09 NOTE — Assessment & Plan Note (Signed)
History of heart failure patient takes fluid pill only on an as-needed basis.  Given rales will do chest x-ray weight is baseline for patient no edema noted on exam

## 2023-02-09 NOTE — Assessment & Plan Note (Signed)
2/2.  Rales left lower lobe of the lungs no appreciable edema fluid pill as needed pending chest x-ray weight at baseline

## 2023-02-09 NOTE — Assessment & Plan Note (Signed)
Rales to left lower lobe.  Does have a history of heart failure and emphysema.  Pending chest x-ray

## 2023-02-09 NOTE — Progress Notes (Signed)
Established Patient Office Visit  Subjective   Patient ID: [name], male    DOB: 11-18-1960  Age: 62 y.o. MRN: [id number]  Chief Complaint  Patient presents with   Medical Management of Chronic Issues   HPI  Depression: states that he is being on citalopram  and is being seen once a month. States that he was required to get EKGs. States that his sleep is poor and has trouble getting his mind to close off. States that he does have restless leg . States that the requip may have helped but he is not sure.   Shortness of breath: is being seen by pulmonolgy and is having trouble with his CPAP. States that he has been off the CPAP for months. They have ordered a nasal pillow that has not come in yet.  Patient is no longer on oxygen at all not even at nighttime or as needed.   Dm2: states that he was seen in feburary and his A1c was 6.1 has a 6 month follow up.  Patient is followed by endocrinology and does have a continuous glucose monitor to monitor his glucoses at home  Cough: states that he feels stuffed up and having a productive cough. Does have a smokers couh. States that he is coughing up more but no change in color. States that he has not been around any one sick        Review of Systems  Constitutional:  Positive for chills and malaise/fatigue. Negative for fever.       Appetite is normal  Fluid intake is good   HENT:  Positive for congestion and sinus pain. Negative for ear discharge, ear pain and sore throat (in the beginning).   Respiratory:  Positive for cough and sputum production. Negative for shortness of breath and wheezing.   Neurological:  Positive for headaches.      Objective:     BP 108/62   Pulse 62   Temp 98 F (36.7 C)   Resp 16   Ht  (1.651 m)   Wt 179 lb (81.2 kg)   SpO2 99%   BMI 29.79 kg/m  BP Readings from Last 3 Encounters:  02/09/23 108/62  01/12/23 120/72  12/28/22 130/70   Wt Readings from Last 3 Encounters:  02/09/23  179 lb (81.2 kg)  01/12/23 172 lb 9.6 oz (78.3 kg)  11/10/22 179 lb (81.2 kg)      Physical Exam Vitals and nursing note reviewed.  Constitutional:      Appearance: Normal appearance.  HENT:     Right Ear: Tympanic membrane, ear canal and external ear normal.     Left Ear: Tympanic membrane, ear canal and external ear normal.     Nose:     Right Sinus: No maxillary sinus tenderness or frontal sinus tenderness.     Left Sinus: No maxillary sinus tenderness or frontal sinus tenderness.     Mouth/Throat:     Mouth: Mucous membranes are moist.     Pharynx: Oropharynx is clear.  Cardiovascular:     Rate and Rhythm: Normal rate and regular rhythm.     Heart sounds: Normal heart sounds.  Pulmonary:     Breath sounds: Rales (LLL) present.  Lymphadenopathy:     Cervical: No cervical adenopathy.  Neurological:     Mental Status: He is alert.      No results found for any visits on 02/09/23.    The ASCVD Risk score (Arnett DK, et al.,  2019) failed to calculate for the following reasons:   The patient has a prior MI or stroke diagnosis    Assessment & Plan:   Problem List Items Addressed This Visit       Cardiovascular and Mediastinum   Acute on chronic systolic CHF (congestive heart failure) - Primary    2/2.  Rales left lower lobe of the lungs no appreciable edema fluid pill as needed pending chest x-ray weight at baseline      HFrEF (heart failure with reduced ejection fraction)    History of heart failure patient takes fluid pill only on an as-needed basis.  Given rales will do chest x-ray weight is baseline for patient no edema noted on exam        Respiratory   Pulmonary emphysema    Patient is followed by pulmonology.  Recently seen by her not currently on any oxygen.  Continue following with pulmonology as recommended        Other   Acute cough    Cough for approximate 1 week.  Patient is low threshold for treatment with antibiotics.  Pending chest x-ray  today.  He can use Mucinex as needed to help loosen up the phlegm      Relevant Orders   DG Chest 2 View   Adventitious breath sounds    Rales to left lower lobe.  Does have a history of heart failure and emphysema.  Pending chest x-ray      Relevant Orders   DG Chest 2 View    Return in about 3 months (around 05/11/2023) for CPE and Labs.    Audria Nine, NP

## 2023-02-10 ENCOUNTER — Telehealth: Payer: Self-pay | Admitting: Adult Health

## 2023-02-10 NOTE — Telephone Encounter (Signed)
Patient's daughter, Clearnce Hasten called [facility] in [place] never got the order for new CPAP supplies/ new mask- states they went through this last time and needs help getting the order over. Please advise and call back at [phone].

## 2023-02-11 NOTE — Telephone Encounter (Signed)
PCCs, can we fax the DME order from 01/12/23 to [phone]?

## 2023-02-11 NOTE — Telephone Encounter (Signed)
I have printed the order along with note and faxed to [phone]

## 2023-02-12 NOTE — Telephone Encounter (Signed)
I called [employer] and spoke with Kara Mead she confirmed that they received the order and notes. RT has spoke with patient about the pressure change and to arrange for [employer] mask. I called daughter Clearnce Hasten and explained this to her she was thankful for the update

## 2023-02-17 DIAGNOSIS — L989 Disorder of the skin and subcutaneous tissue, unspecified: Secondary | ICD-10-CM | POA: Diagnosis not present

## 2023-02-17 DIAGNOSIS — C44519 Basal cell carcinoma of skin of other part of trunk: Secondary | ICD-10-CM | POA: Diagnosis not present

## 2023-02-22 DIAGNOSIS — I5032 Chronic diastolic (congestive) heart failure: Secondary | ICD-10-CM | POA: Diagnosis not present

## 2023-02-22 DIAGNOSIS — I5022 Chronic systolic (congestive) heart failure: Secondary | ICD-10-CM | POA: Diagnosis not present

## 2023-02-22 DIAGNOSIS — D696 Thrombocytopenia, unspecified: Secondary | ICD-10-CM | POA: Diagnosis not present

## 2023-02-22 DIAGNOSIS — N189 Chronic kidney disease, unspecified: Secondary | ICD-10-CM | POA: Diagnosis not present

## 2023-02-22 DIAGNOSIS — I480 Paroxysmal atrial fibrillation: Secondary | ICD-10-CM | POA: Diagnosis not present

## 2023-02-22 DIAGNOSIS — I2510 Atherosclerotic heart disease of native coronary artery without angina pectoris: Secondary | ICD-10-CM | POA: Diagnosis not present

## 2023-02-22 DIAGNOSIS — Z1159 Encounter for screening for other viral diseases: Secondary | ICD-10-CM | POA: Diagnosis not present

## 2023-02-22 DIAGNOSIS — G2581 Restless legs syndrome: Secondary | ICD-10-CM | POA: Diagnosis not present

## 2023-02-26 DIAGNOSIS — C44319 Basal cell carcinoma of skin of other parts of face: Secondary | ICD-10-CM | POA: Diagnosis not present

## 2023-03-01 DIAGNOSIS — Z48817 Encounter for surgical aftercare following surgery on the skin and subcutaneous tissue: Secondary | ICD-10-CM | POA: Diagnosis not present

## 2023-03-04 DIAGNOSIS — A419 Sepsis, unspecified organism: Secondary | ICD-10-CM | POA: Diagnosis not present

## 2023-03-05 DIAGNOSIS — A419 Sepsis, unspecified organism: Secondary | ICD-10-CM | POA: Diagnosis not present

## 2023-03-06 DIAGNOSIS — E119 Type 2 diabetes mellitus without complications: Secondary | ICD-10-CM | POA: Diagnosis not present

## 2023-03-16 DIAGNOSIS — Z48817 Encounter for surgical aftercare following surgery on the skin and subcutaneous tissue: Secondary | ICD-10-CM | POA: Diagnosis not present

## 2023-03-17 DIAGNOSIS — E113412 Type 2 diabetes mellitus with severe nonproliferative diabetic retinopathy with macular edema, left eye: Secondary | ICD-10-CM | POA: Diagnosis not present

## 2023-03-24 ENCOUNTER — Ambulatory Visit (INDEPENDENT_AMBULATORY_CARE_PROVIDER_SITE_OTHER): Payer: 59 | Admitting: Pulmonary Disease

## 2023-03-24 ENCOUNTER — Encounter: Payer: Self-pay | Admitting: Pulmonary Disease

## 2023-03-24 VITALS — BP 122/80 | HR 66 | Ht 68.0 in | Wt 176.0 lb

## 2023-03-24 DIAGNOSIS — F17210 Nicotine dependence, cigarettes, uncomplicated: Secondary | ICD-10-CM

## 2023-03-24 DIAGNOSIS — G4733 Obstructive sleep apnea (adult) (pediatric): Secondary | ICD-10-CM

## 2023-03-24 DIAGNOSIS — Z720 Tobacco use: Secondary | ICD-10-CM

## 2023-03-24 NOTE — Progress Notes (Signed)
[name]    [id number]    [birth year]  Primary Care Physician:Cable, Genene Churn, NP  Referring Physician: Eden Emms, NP [address] [place],  [place] [address]  Chief complaint:   Patient with a history of obstructive sleep apnea History of obstructive lung disease  HPI:  Diagnosed with severe obstructive sleep apnea for which he was on CPAP therapy -Has not been able to use CPAP in the last 2 to 3 months because of mask issues Was having a lot of mask leaks and needed to try a nasal pillow -Has not been able to get enough help from the medical supply company to try nasal pillows for his obstructive sleep apnea  His sleep study did reveal significant oxygen desaturations  Has a history of obstructive lung disease  He was able to quit smoking for a little while recently for about a month but is back up to smoking about 1 and half packs a day  He was hospitalized in October 2023 for respiratory failure, pneumonia  Has underlying history of atrial fibrillation, diabetes, coronary artery disease  It was prescribed Breztri which he does not remember to use all the time  He was having significant enough difficulty with using the CPAP and felt it was actually keeping him from being able to get enough rest  Outpatient Encounter Medications as of 03/24/2023  Medication Sig   acetaminophen (TYLENOL) 500 MG tablet Take 1,000 mg by mouth every 6 (six) hours as needed for headache.   albuterol (VENTOLIN HFA) 108 (90 Base) MCG/ACT inhaler Inhale 1-2 puffs into the lungs every 6 (six) hours as needed for wheezing or shortness of breath.   aspirin 81 MG chewable tablet Chew 1 tablet (81 mg total) by mouth daily.   atorvastatin (LIPITOR) 80 MG tablet Take 1 tablet (80 mg total) by mouth at bedtime.   Budeson-Glycopyrrol-Formoterol (BREZTRI AEROSPHERE) 160-9-4.8 MCG/ACT AERO Inhale 2 puffs into the lungs in the morning and at bedtime.   citalopram (CELEXA) 20 MG tablet  Take 30 mg by mouth daily.   ezetimibe (ZETIA) 10 MG tablet Take 1 tablet by mouth daily.   furosemide (LASIX) 20 MG tablet Take 1 tablet (20 mg total) by mouth daily. (Patient taking differently: Take 20 mg by mouth daily as needed.)   gabapentin (NEURONTIN) 300 MG capsule TAKE 2 CAPSULES BY MOUTH AT BEDTIME   glucose blood test strip 1 each by Other route as needed for other. For one touch ultra meter. DX: E11.9   insulin isophane & regular human KwikPen (HUMULIN 70/30 KWIKPEN) (70-30) 100 UNIT/ML KwikPen Inject 50-60 Units into the skin See admin instructions. Please take 50 units every morning and 60 units with dinner   Insulin Pen Needle 32G X 4 MM MISC Use to inject Insulin daily. Dx: E11.9   Insulin Syringe-Needle U-100 (INSULIN SYRINGE .3CC/31GX5/16") 31G X 5/16" 0.3 ML MISC 1 Units by Does not apply route 2 (two) times daily.   Lancets (ONETOUCH ULTRASOFT) lancets Use as instructed   metoprolol succinate (TOPROL-XL) 100 MG 24 hr tablet Take 100 mg by mouth daily.   nicotine (NICODERM CQ - DOSED IN MG/24 HOURS) 21 mg/24hr patch Place 1 patch (21 mg total) onto the skin daily.   nitroGLYCERIN (NITROSTAT) 0.4 MG SL tablet Place 1 tablet (0.4 mg total) under the tongue every 5 (five) minutes as needed for chest pain.   rOPINIRole (REQUIP) 0.25 MG tablet Take 0.25 mg by  mouth at bedtime.   sacubitril-valsartan (ENTRESTO) 24-26 MG Take 0.5 tablets by mouth 2 (two) times daily.   sitaGLIPtin (JANUVIA) 100 MG tablet Take 1 tablet by mouth daily.   JARDIANCE 25 MG TABS tablet Take 25 mg by mouth daily. (Patient not taking: Reported on 03/24/2023)   traZODone (DESYREL) 100 MG tablet Take 100 mg by mouth at bedtime. (Patient not taking: Reported on 03/24/2023)   No facility-administered encounter medications on file as of 03/24/2023.    Allergies as of 03/24/2023 - Review Complete 03/24/2023  Allergen Reaction Noted   Latex Rash 06/16/2016    Past Medical History:  Diagnosis Date   Abrasion L  FOREARM   Atrial fibrillation (HCC)    a. initially occurring in post-op setting in 05/2016, started on Eliquis b. 08/2016: s/p clipping of atrial appendage at time of CABG --> Eliquis switched to Coumadin   CAD (coronary artery disease)    a. 05/2016: NSTEMI, cath showing 3v disease with CABG recommended once MRSA infection resolved. b. 08/2016: CABG with LIMA-LAD, SVG-D1, and SVG-RI.   Cancer (HCC)    basal cell carcinoma of the skin   Diabetes mellitus without complication (HCC)    Headache(784.0)    Hematuria, microscopic "SINCE I WAS A KID"   Hyperlipidemia    Hypertension    Ischemic cardiomyopathy    a. 05/2016: echo w/ EF of 25-35% and akinesis of the basal-midinferolateral and inferior myocardium.   Lactose intolerance    Myocardial infarction (HCC)    Respiratory failure (HCC)    Rotator cuff tear RIGHT   Tobacco use    a. quit in 05/2016 following MI    Past Surgical History:  Procedure Laterality Date   AMPUTATION TOE Left 02/06/2020   5th ray amputation with graph [facility])   BRONCHIAL BRUSHINGS  08/01/2022   Procedure: BRONCHIAL BRUSHINGS;  Surgeon: Lorin Glass, MD;  Location: [facility] ENDOSCOPY;  Service: Pulmonary;;   BRONCHIAL WASHINGS  08/01/2022   Procedure: BRONCHIAL WASHINGS;  Surgeon: Lorin Glass, MD;  Location: [facility] ENDOSCOPY;  Service: Pulmonary;;   CARDIAC CATHETERIZATION N/A 06/16/2016   Procedure: Left Heart Cath and Coronary Angiography;  Surgeon: Lennette Bihari, MD;  Location: [facility] INVASIVE CV LAB;  Service: Cardiovascular;  Laterality: N/A;   CLIPPING OF ATRIAL APPENDAGE N/A 08/24/2016   Procedure: CLIPPING OF ATRIAL APPENDAGE;  Surgeon: Kerin Perna, MD;  Location: [facility] OR;  Service: Open Heart Surgery;  Laterality: N/A;   COLONOSCOPY WITH PROPOFOL N/A 02/12/2022   Procedure: COLONOSCOPY WITH PROPOFOL;  Surgeon: Midge Minium, MD;  Location: [facility] ENDOSCOPY;  Service: Endoscopy;  Laterality: N/A;   CORONARY ARTERY BYPASS GRAFT N/A 08/24/2016   Procedure:  CORONARY ARTERY BYPASS GRAFTING (CABG) x three,  using left internal mammary artery and right leg greater saphenous vein harvested endoscopically;  Surgeon: Kerin Perna, MD;  Location: [facility] OR;  Service: Open Heart Surgery;  Laterality: N/A;   HERNIA REPAIR  X2   INCISION AND DRAINAGE ABSCESS Left 06/10/2016   Procedure: INCISION AND DRAINAGE LEFT AXILLARY ABSCESS;  Surgeon: Chevis Pretty III, MD;  Location: [facility] ORS;  Service: General;  Laterality: Left;   ROTATOR CUFF REPAIR Bilateral    TEE WITHOUT CARDIOVERSION N/A 08/24/2016   Procedure: TRANSESOPHAGEAL ECHOCARDIOGRAM (TEE);  Surgeon: Kerin Perna, MD;  Location: [facility] OR;  Service: Open Heart Surgery;  Laterality: N/A;   VIDEO BRONCHOSCOPY Right 08/01/2022   Procedure: VIDEO BRONCHOSCOPY WITH FLUORO;  Surgeon: Lorin Glass, MD;  Location: [facility] ENDOSCOPY;  Service: Pulmonary;  Laterality: Right;    Family History  Problem Relation Age of Onset   Hypertension Mother    Hypertension Father    Sudden death Father 66       at age of death   Diabetes Father    Heart attack Father    Heart attack Sister    Cancer Maternal Aunt        breast x2 aunts    Social History   Socioeconomic History   Marital status: Married    Spouse name: Not on file   Number of children: Not on file   Years of education: Not on file   Highest education level: Not on file  Occupational History   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  Smoking a pack per day.  Trying to cut back.  01/12/2023 hfb  Substance and Sexual Activity   Alcohol use: No   Drug use: No   Sexual activity: Not on file  Other Topics Concern   Not on file  Social History Narrative   Not on file   Social Determinants of Health   Financial  Resource Strain: Low Risk  (08/10/2022)   Overall Financial Resource Strain (CARDIA)    Difficulty of Paying Living Expenses: Not hard at all  Food Insecurity: No Food Insecurity (08/10/2022)   Hunger Vital Sign    Worried About Running Out of Food in the Last Year: Never true    Ran Out of Food in the Last Year: Never true  Transportation Needs: No Transportation Needs (08/10/2022)   PRAPARE - Administrator, Civil Service (Medical): No    Lack of Transportation (Non-Medical): No  Physical Activity: Insufficiently Active (08/10/2022)   Exercise Vital Sign    Days of Exercise per Week: 2 days    Minutes of Exercise per Session: 20 min  Stress: No Stress Concern Present (08/10/2022)   [employer] of Occupational Health - Occupational Stress Questionnaire    Feeling of Stress : Only a little  Social Connections: Socially Isolated (08/10/2022)   Social Connection and Isolation Panel [NHANES]    Frequency of Communication with Friends and Family: More than three times a week    Frequency of Social Gatherings with Friends and Family: More than three times a week    Attends Religious Services: Never    Database administrator or Organizations: No    Attends Banker Meetings: Never    Marital Status: Separated  Intimate Partner Violence: Not At Risk (08/10/2022)   Humiliation, Afraid, Rape, and Kick questionnaire    Fear of Current or Ex-Partner: No    Emotionally Abused: No    Physically Abused: No    Sexually Abused: No    Review of Systems  Respiratory:  Positive for apnea and shortness of breath.   Psychiatric/Behavioral:  Positive for sleep disturbance.     Vitals:   03/24/23 1051  BP: 122/80  Pulse: 66  SpO2: 97%     Physical Exam Constitutional:      Appearance: Normal appearance.  HENT:     Head: Normocephalic.     Mouth/Throat:     Mouth:  Mucous membranes are moist.  Eyes:     General: No scleral icterus. Cardiovascular:     Rate  and Rhythm: Normal rate and regular rhythm.     Heart sounds: No murmur heard.    No friction rub.  Pulmonary:     Effort: No respiratory distress.     Breath sounds: No stridor. No wheezing or rhonchi.  Musculoskeletal:     Cervical back: No rigidity or tenderness.  Neurological:     Mental Status: He is alert.  Psychiatric:        Mood and Affect: Mood normal.    Data Reviewed: Pulmonary function test reveals moderate obstruction with significant bronchodilator response  Sleep study 01/17/2021 with AHI of 56, O2 nadir of 42%  Assessment:  Severe obstructive sleep apnea -Not currently on CPAP therapy  Moderate obstructive lung disease -On Breztri  Active smoker  Smoking cessation counseling    Plan/Recommendations: Encouraged to reach out to DME to attempt to resolve mask issues  Encouraged to continue working on smoking cessation  Encouraged to continue using Breztri  Graded activities as tolerated  Follow-up in about 3 months  Encouraged to call with significant concerns  Encouraged to give [place] a call if still not able to get enough help from DME company  Risk with not treating sleep disordered breathing was discussed with the patient  Risk with continuing to smoke discussed with the patient   Virl Diamond MD [facility] 03/24/2023, 10:59 AM  CC: Eden Emms, NP

## 2023-03-24 NOTE — Patient Instructions (Signed)
Try and reach out to her DME company for resolution about the mask  Continue smoking cessation efforts  Regular exercises as tolerated  Continue Breztri  Follow-up in 3 months  Call us with significant concerns  If the DME company is not helpful, we can make a referral to a different

## 2023-03-31 ENCOUNTER — Ambulatory Visit: Payer: 59 | Admitting: Podiatry

## 2023-04-04 DIAGNOSIS — A419 Sepsis, unspecified organism: Secondary | ICD-10-CM | POA: Diagnosis not present

## 2023-04-05 DIAGNOSIS — A419 Sepsis, unspecified organism: Secondary | ICD-10-CM | POA: Diagnosis not present

## 2023-04-05 DIAGNOSIS — E119 Type 2 diabetes mellitus without complications: Secondary | ICD-10-CM | POA: Diagnosis not present

## 2023-04-06 ENCOUNTER — Ambulatory Visit (INDEPENDENT_AMBULATORY_CARE_PROVIDER_SITE_OTHER): Payer: 59 | Admitting: Podiatry

## 2023-04-06 DIAGNOSIS — M21621 Bunionette of right foot: Secondary | ICD-10-CM | POA: Diagnosis not present

## 2023-04-06 NOTE — Progress Notes (Signed)
  Subjective:  Patient ID: [name], male    DOB: 10/24/1960,  MRN: [id number]  Chief Complaint  Patient presents with   Nail Problem    Nail trim     62 y.o. male presents with the above complaint. History confirmed with patient.  He returns for follow-up.  His blood sugar is well-controlled.  His nails are thickened and elongated and his calluses have returned and are causing discomfort.  Objective:  Physical Exam: warm, good capillary refill, no trophic changes or ulcerative lesions, normal DP and PT pulses, and he has an abnormal sensory exam with loss of protective sensation. Left Foot: dystrophic yellowed discolored nail plates with subungual debris and fourth PIPJ callus Right Foot: dystrophic yellowed discolored nail plates with subungual debris and submetatarsal 5 and medial hallux pinch callus  Assessment:   1. Bunionette of right foot       Plan:  Patient was evaluated and treated and all questions answered.  Patient educated on diabetes. Discussed proper diabetic foot care and discussed risks and complications of disease. Educated patient in depth on reasons to return to the office immediately should he/she discover anything concerning or new on the feet. All questions answered. Discussed proper shoes as well.   Discussed the etiology and treatment options for the condition in detail with the patient. Educated patient on the topical and oral treatment options for mycotic nails. Recommended debridement of the nails today. Sharp and mechanical debridement performed of all painful and mycotic nails today. Nails debrided in length and thickness using a nail nipper to level of comfort. Discussed treatment options including appropriate shoe gear. Follow up as needed for painful nails.   All symptomatic hyperkeratoses were safely debrided with a sterile #15 blade to patient's level of comfort without incident. We discussed preventative and palliative care of these lesions  including supportive and accommodative shoegear, padding, prefabricated and custom molded accommodative orthoses, use of a pumice stone and lotions/creams daily.   No follow-ups on file.

## 2023-04-07 DIAGNOSIS — G4733 Obstructive sleep apnea (adult) (pediatric): Secondary | ICD-10-CM | POA: Diagnosis not present

## 2023-04-15 ENCOUNTER — Ambulatory Visit (INDEPENDENT_AMBULATORY_CARE_PROVIDER_SITE_OTHER): Payer: 59 | Admitting: Family Medicine

## 2023-04-15 ENCOUNTER — Telehealth: Payer: Self-pay | Admitting: Nurse Practitioner

## 2023-04-15 ENCOUNTER — Encounter: Payer: Self-pay | Admitting: Family Medicine

## 2023-04-15 VITALS — BP 122/68 | HR 71 | Temp 97.3°F | Ht 65.0 in | Wt 175.0 lb

## 2023-04-15 DIAGNOSIS — I5020 Unspecified systolic (congestive) heart failure: Secondary | ICD-10-CM | POA: Diagnosis not present

## 2023-04-15 DIAGNOSIS — R42 Dizziness and giddiness: Secondary | ICD-10-CM | POA: Diagnosis not present

## 2023-04-15 DIAGNOSIS — R0981 Nasal congestion: Secondary | ICD-10-CM | POA: Diagnosis not present

## 2023-04-15 DIAGNOSIS — Z794 Long term (current) use of insulin: Secondary | ICD-10-CM | POA: Diagnosis not present

## 2023-04-15 DIAGNOSIS — E1169 Type 2 diabetes mellitus with other specified complication: Secondary | ICD-10-CM | POA: Diagnosis not present

## 2023-04-15 NOTE — Telephone Encounter (Signed)
Pt's daughter, Clearnce Hasten, called stating the pt has had dizzy spells the last few days. Scheduled ov with Dr. Ermalene Searing for today, 6/27, @ 2pm. Transferred pt & [name] to access nurse. Call back # [phone]

## 2023-04-15 NOTE — Assessment & Plan Note (Signed)
Acute, no clear sign of bacterial superinfection, likely secondary to allergies versus viral infection.  Will start Flonase 2 sprays per nostril daily.  This may be contributing to middle ear fluid and possible vertigo symptoms.  He will call if there is color change to the mucus given history of COPD. Precautions provided

## 2023-04-15 NOTE — Assessment & Plan Note (Signed)
Chronic, well-controlled on current regimen.  Patient appears euvolemic.  He is not over diuresed as he is not regularly using Lasix.

## 2023-04-15 NOTE — Assessment & Plan Note (Signed)
Acute, intermittent No new medications likely causing symptoms. Symptoms are most consistent with episodic hypotension ... He has lost 5 pounds and most significant recent episode occurred after being out in the heat with limited hydration. Requested daughter to check his blood pressure during an episode if recurs. Less likely inner ear issues from nasal congestion. Doubt seizure disorder. Will evaluate with labs to rule out additional secondary causes including complete metabolic panel thyroid, CBC and B12.

## 2023-04-15 NOTE — Assessment & Plan Note (Signed)
Chronic, well controlled on current regimen.  °

## 2023-04-15 NOTE — Telephone Encounter (Signed)
Per appt notes pt already has appt scheduled with Dr Ermalene Searing 04/15/23 at 2pm. Sending note to Dr Ermalene Searing and [place] pool.

## 2023-04-15 NOTE — Progress Notes (Signed)
Patient ID: [name], male    DOB: March 30, 1961, 62 y.o.   MRN: [id number]  This visit was conducted in person.  BP 122/68   Pulse 71   Temp (!) 97.3 F (36.3 C) (Temporal)   Ht 5\' 5"  (1.651 m)   Wt 175 lb (79.4 kg)   SpO2 96%   BMI 29.12 kg/m    CC:  Chief Complaint  Patient presents with   Dizziness    C/o recent dizzy spells. Started a few mos ago, now more frequent. b    Subjective:   HPI: [name] is a 62 y.o. male  patient of Audria Nine, NP with history of  diabetes, HTN, CAD, HFrEF, PAF, and COPD presenting on 04/15/2023 for Dizziness (C/o recent dizzy spells. Started a few mos ago, now more frequent. b)  Reviewed recent cardiology office visit note from Feb 22, 2023 Dr. Paulino Rily at [facility] to be euvolemic. No med changes recommended  Wt Readings from Last 3 Encounters:  04/15/23 175 lb (79.4 kg)  03/24/23 176 lb (79.8 kg)  02/09/23 179 lb (81.2 kg)    In last month he has been having episodes of lighthededness, off balance and ... ongoing for several months... usually last 3-4 min, once a month  Now in last 3-4 days t thas been happening daily.  Has resulted in falls, has to sit down  Yesterday the spell  lasted longer.Marland Kitchen occurred after getting up from pool float. Had occurred after sweating a lot and in pool all day.  Dexcom showed dropping sugar from 150 down rapidly to Liquid IV helped.   No associated CP, SOB, no headache, no numbness or weakness.   In past  some symptoms, had similar symptoms felt associated with wellbutrin... no longer on.    No new meds or supplement.   5 lb weight loss since April.  Now lasix is only as needed. Wt Readings from Last 3 Encounters:  04/15/23 175 lb (79.4 kg)  03/24/23 176 lb (79.8 kg)  02/09/23 179 lb (81.2 kg)   He has had 2 weeks of nasal congestion, clear runny nose, clear productive cough at times  Relevant past medical, surgical, family and social history reviewed and updated as indicated. Interim  medical history since our last visit reviewed. Allergies and medications reviewed and updated. Outpatient Medications Prior to Visit  Medication Sig Dispense Refill   acetaminophen (TYLENOL) 500 MG tablet Take 1,000 mg by mouth every 6 (six) hours as needed for headache.     albuterol (VENTOLIN HFA) 108 (90 Base) MCG/ACT inhaler Inhale 1-2 puffs into the lungs every 6 (six) hours as needed for wheezing or shortness of breath. 8 g 2   aspirin 81 MG chewable tablet Chew 1 tablet (81 mg total) by mouth daily. 30 tablet 3   atorvastatin (LIPITOR) 80 MG tablet Take 1 tablet (80 mg total) by mouth at bedtime. 30 tablet 11   Budeson-Glycopyrrol-Formoterol (BREZTRI AEROSPHERE) 160-9-4.8 MCG/ACT AERO Inhale 2 puffs into the lungs in the morning and at bedtime. 5.9 g 0   citalopram (CELEXA) 20 MG tablet Take 30 mg by mouth daily.     ezetimibe (ZETIA) 10 MG tablet Take 1 tablet by mouth daily.     furosemide (LASIX) 20 MG tablet Take 1 tablet (20 mg total) by mouth daily. (Patient taking differently: Take 20 mg by mouth daily as needed.) 7 tablet 0   gabapentin (NEURONTIN) 300 MG capsule TAKE 2 CAPSULES BY MOUTH AT  BEDTIME 60 capsule 5   glucose blood test strip 1 each by Other route as needed for other. For one touch ultra meter. DX: E11.9 100 each 3   insulin isophane & regular human KwikPen (HUMULIN 70/30 KWIKPEN) (70-30) 100 UNIT/ML KwikPen Inject 50-60 Units into the skin See admin instructions. Please take 50 units every morning and 60 units with dinner     Insulin Pen Needle 32G X 4 MM MISC Use to inject Insulin daily. Dx: E11.9 100 each 3   Insulin Syringe-Needle U-100 (INSULIN SYRINGE .3CC/31GX5/16") 31G X 5/16" 0.3 ML MISC 1 Units by Does not apply route 2 (two) times daily. 100 each 2   Lancets (ONETOUCH ULTRASOFT) lancets Use as instructed 100 each 12   metoprolol succinate (TOPROL-XL) 100 MG 24 hr tablet Take 100 mg by mouth daily.     nicotine (NICODERM CQ - DOSED IN MG/24 HOURS) 21 mg/24hr  patch Place 1 patch (21 mg total) onto the skin daily. 28 patch 0   nitroGLYCERIN (NITROSTAT) 0.4 MG SL tablet Place 1 tablet (0.4 mg total) under the tongue every 5 (five) minutes as needed for chest pain. 25 tablet 3   rOPINIRole (REQUIP) 0.25 MG tablet Take 0.25 mg by mouth at bedtime.     sacubitril-valsartan (ENTRESTO) 24-26 MG Take 0.5 tablets by mouth 2 (two) times daily.     sitaGLIPtin (JANUVIA) 100 MG tablet Take 1 tablet by mouth daily.     JARDIANCE 25 MG TABS tablet Take 25 mg by mouth daily. (Patient not taking: Reported on 04/15/2023)     traZODone (DESYREL) 100 MG tablet Take 100 mg by mouth at bedtime.     No facility-administered medications prior to visit.     Per HPI unless specifically indicated in ROS section below Review of Systems  Constitutional:  Negative for fatigue and fever.  HENT:  Positive for sinus pressure. Negative for ear pain.   Eyes:  Negative for pain.  Respiratory:  Negative for cough and shortness of breath.   Cardiovascular:  Negative for chest pain, palpitations and leg swelling.  Gastrointestinal:  Negative for abdominal pain.  Genitourinary:  Negative for dysuria.  Musculoskeletal:  Negative for arthralgias.  Neurological:  Negative for syncope, light-headedness and headaches.  Psychiatric/Behavioral:  Negative for dysphoric mood.    Objective:  BP 122/68   Pulse 71   Temp (!) 97.3 F (36.3 C) (Temporal)   Ht 5\' 5"  (1.651 m)   Wt 175 lb (79.4 kg)   SpO2 96%   BMI 29.12 kg/m   Wt Readings from Last 3 Encounters:  04/15/23 175 lb (79.4 kg)  03/24/23 176 lb (79.8 kg)  02/09/23 179 lb (81.2 kg)      Physical Exam Constitutional:      Appearance: He is well-developed.  HENT:     Head: Normocephalic.     Right Ear: Hearing normal.     Left Ear: Hearing normal.     Nose: Nose normal.  Neck:     Thyroid: No thyroid mass or thyromegaly.     Vascular: No carotid bruit.     Trachea: Trachea normal.  Cardiovascular:     Rate and  Rhythm: Normal rate and regular rhythm.     Pulses: Normal pulses.     Heart sounds: Heart sounds not distant. Murmur heard.     Systolic murmur is present.     No friction rub. No gallop.     Comments: No peripheral edema Pulmonary:  Effort: Pulmonary effort is normal. No respiratory distress.     Breath sounds: Normal breath sounds.  Musculoskeletal:     Right lower leg: No edema.     Left lower leg: No edema.  Skin:    General: Skin is warm and dry.     Findings: No rash.  Neurological:     General: No focal deficit present.     Mental Status: He is oriented to person, place, and time.     Cranial Nerves: Cranial nerves 2-12 are intact.     Sensory: Sensation is intact.     Motor: Motor function is intact.     Comments: No lightheadedness going sitting to standing, no change in vitals  Psychiatric:        Speech: Speech normal.        Behavior: Behavior normal.        Thought Content: Thought content normal.       Results for orders placed or performed in visit on 10/23/22  Brain natriuretic peptide  Result Value Ref Range   Pro B Natriuretic peptide (BNP) 238.0 (H) 0.0 - 100.0 pg/mL  Basic metabolic panel  Result Value Ref Range   Sodium 148 (H) 135 - 145 mEq/L   Potassium 4.0 3.5 - 5.1 mEq/L   Chloride 109 96 - 112 mEq/L   CO2 30 19 - 32 mEq/L   Glucose, Bld 78 70 - 99 mg/dL   BUN 17 6 - 23 mg/dL   Creatinine, Ser 2.13 0.40 - 1.50 mg/dL   GFR 08.65 >78.46 mL/min   Calcium 8.4 8.4 - 10.5 mg/dL    Assessment and Plan  HFrEF (heart failure with reduced ejection fraction) (HCC) Assessment & Plan: Chronic, well-controlled on current regimen.  Patient appears euvolemic.  He is not over diuresed as he is not regularly using Lasix.   Episodic lightheadedness Assessment & Plan: Acute, intermittent No new medications likely causing symptoms. Symptoms are most consistent with episodic hypotension ... He has lost 5 pounds and most significant recent episode  occurred after being out in the heat with limited hydration. Requested daughter to check his blood pressure during an episode if recurs. Less likely inner ear issues from nasal congestion. Doubt seizure disorder. Will evaluate with labs to rule out additional secondary causes including complete metabolic panel thyroid, CBC and B12.  Orders: -     Comprehensive metabolic panel -     CBC with Differential/Platelet -     TSH -     Vitamin B12  Type 2 diabetes mellitus with other specified complication, with long-term current use of insulin (HCC) Assessment & Plan: Chronic, well-controlled on current regimen   Sinus congestion Assessment & Plan: Acute, no clear sign of bacterial superinfection, likely secondary to allergies versus viral infection.  Will start Flonase 2 sprays per nostril daily.  This may be contributing to middle ear fluid and possible vertigo symptoms.  He will call if there is color change to the mucus given history of COPD. Precautions provided     No follow-ups on file.   Kerby Nora, MD

## 2023-04-15 NOTE — Telephone Encounter (Signed)
Noted  

## 2023-04-16 LAB — COMPREHENSIVE METABOLIC PANEL
ALT: 20 U/L (ref 0–53)
AST: 21 U/L (ref 0–37)
Albumin: 4.2 g/dL (ref 3.5–5.2)
Alkaline Phosphatase: 120 U/L — ABNORMAL HIGH (ref 39–117)
BUN: 37 mg/dL — ABNORMAL HIGH (ref 6–23)
CO2: 29 mEq/L (ref 19–32)
Calcium: 9.1 mg/dL (ref 8.4–10.5)
Chloride: 103 mEq/L (ref 96–112)
Creatinine, Ser: 1.79 mg/dL — ABNORMAL HIGH (ref 0.40–1.50)
GFR: 40.29 mL/min — ABNORMAL LOW (ref >60.00)
Glucose, Bld: 291 mg/dL — ABNORMAL HIGH (ref 70–99)
Potassium: 4.9 mEq/L (ref 3.5–5.1)
Sodium: 143 mEq/L (ref 135–145)
Total Bilirubin: 0.6 mg/dL (ref 0.2–1.2)
Total Protein: 6.9 g/dL (ref 6.0–8.3)

## 2023-04-16 LAB — CBC WITH DIFFERENTIAL/PLATELET
Basophils Absolute: 0 10*3/uL (ref 0.0–0.1)
Basophils Relative: 0.5 % (ref 0.0–3.0)
Eosinophils Absolute: 0.3 10*3/uL (ref 0.0–0.7)
Eosinophils Relative: 3.9 % (ref 0.0–5.0)
HCT: 48.2 % (ref 39.0–52.0)
Hemoglobin: 15.8 g/dL (ref 13.0–17.0)
Lymphocytes Relative: 21 % (ref 12.0–46.0)
Lymphs Abs: 1.7 10*3/uL (ref 0.7–4.0)
MCHC: 32.8 g/dL (ref 30.0–36.0)
MCV: 86.3 fl (ref 78.0–100.0)
Monocytes Absolute: 0.8 10*3/uL (ref 0.1–1.0)
Monocytes Relative: 9.1 % (ref 3.0–12.0)
Neutro Abs: 5.4 10*3/uL (ref 1.4–7.7)
Neutrophils Relative %: 65.5 % (ref 43.0–77.0)
Platelets: 107 10*3/uL — ABNORMAL LOW (ref 150.0–400.0)
RBC: 5.59 Mil/uL (ref 4.22–5.81)
RDW: 17 % — ABNORMAL HIGH (ref 11.5–15.5)
WBC: 8.3 10*3/uL (ref 4.0–10.5)

## 2023-04-16 LAB — TSH: TSH: 1.24 u[IU]/mL (ref 0.35–5.50)

## 2023-04-16 LAB — VITAMIN B12: Vitamin B-12: 376 pg/mL (ref 211–911)

## 2023-05-04 DIAGNOSIS — A419 Sepsis, unspecified organism: Secondary | ICD-10-CM | POA: Diagnosis not present

## 2023-05-05 DIAGNOSIS — A419 Sepsis, unspecified organism: Secondary | ICD-10-CM | POA: Diagnosis not present

## 2023-05-06 DIAGNOSIS — E119 Type 2 diabetes mellitus without complications: Secondary | ICD-10-CM | POA: Diagnosis not present

## 2023-05-11 ENCOUNTER — Encounter: Payer: Self-pay | Admitting: Nurse Practitioner

## 2023-05-11 ENCOUNTER — Ambulatory Visit: Payer: 59 | Admitting: Nurse Practitioner

## 2023-05-11 VITALS — BP 110/62 | HR 76 | Temp 97.6°F | Ht 65.0 in | Wt 171.4 lb

## 2023-05-11 DIAGNOSIS — Z951 Presence of aortocoronary bypass graft: Secondary | ICD-10-CM

## 2023-05-11 DIAGNOSIS — Z0000 Encounter for general adult medical examination without abnormal findings: Secondary | ICD-10-CM | POA: Diagnosis not present

## 2023-05-11 DIAGNOSIS — Z114 Encounter for screening for human immunodeficiency virus [HIV]: Secondary | ICD-10-CM

## 2023-05-11 DIAGNOSIS — I5020 Unspecified systolic (congestive) heart failure: Secondary | ICD-10-CM

## 2023-05-11 DIAGNOSIS — Z125 Encounter for screening for malignant neoplasm of prostate: Secondary | ICD-10-CM | POA: Diagnosis not present

## 2023-05-11 DIAGNOSIS — I10 Essential (primary) hypertension: Secondary | ICD-10-CM

## 2023-05-11 DIAGNOSIS — J449 Chronic obstructive pulmonary disease, unspecified: Secondary | ICD-10-CM | POA: Diagnosis not present

## 2023-05-11 DIAGNOSIS — Z720 Tobacco use: Secondary | ICD-10-CM | POA: Diagnosis not present

## 2023-05-11 DIAGNOSIS — E1169 Type 2 diabetes mellitus with other specified complication: Secondary | ICD-10-CM

## 2023-05-11 DIAGNOSIS — Z794 Long term (current) use of insulin: Secondary | ICD-10-CM | POA: Diagnosis not present

## 2023-05-11 DIAGNOSIS — Z1159 Encounter for screening for other viral diseases: Secondary | ICD-10-CM

## 2023-05-11 LAB — COMPREHENSIVE METABOLIC PANEL
ALT: 22 U/L (ref 0–53)
AST: 27 U/L (ref 0–37)
Albumin: 4.4 g/dL (ref 3.5–5.2)
Alkaline Phosphatase: 109 U/L (ref 39–117)
BUN: 34 mg/dL — ABNORMAL HIGH (ref 6–23)
CO2: 27 mEq/L (ref 19–32)
Calcium: 9.4 mg/dL (ref 8.4–10.5)
Chloride: 107 mEq/L (ref 96–112)
Creatinine, Ser: 1.68 mg/dL — ABNORMAL HIGH (ref 0.40–1.50)
GFR: 43.46 mL/min — ABNORMAL LOW (ref >60.00)
Glucose, Bld: 202 mg/dL — ABNORMAL HIGH (ref 70–99)
Potassium: 4.7 mEq/L (ref 3.5–5.1)
Sodium: 142 mEq/L (ref 135–145)
Total Bilirubin: 0.6 mg/dL (ref 0.2–1.2)
Total Protein: 7 g/dL (ref 6.0–8.3)

## 2023-05-11 LAB — CBC
HCT: 48.3 % (ref 39.0–52.0)
Hemoglobin: 15.7 g/dL (ref 13.0–17.0)
MCHC: 32.6 g/dL (ref 30.0–36.0)
MCV: 87.2 fl (ref 78.0–100.0)
Platelets: 128 10*3/uL — ABNORMAL LOW (ref 150.0–400.0)
RBC: 5.54 Mil/uL (ref 4.22–5.81)
RDW: 16.6 % — ABNORMAL HIGH (ref 11.5–15.5)
WBC: 9.8 10*3/uL (ref 4.0–10.5)

## 2023-05-11 LAB — MICROALBUMIN / CREATININE URINE RATIO
Creatinine,U: 237.7 mg/dL
Microalb Creat Ratio: 83.2 mg/g — ABNORMAL HIGH (ref 0.0–30.0)
Microalb, Ur: 197.8 mg/dL — ABNORMAL HIGH (ref 0.0–1.9)

## 2023-05-11 LAB — URINALYSIS, MICROSCOPIC ONLY

## 2023-05-11 LAB — LIPID PANEL
Cholesterol: 124 mg/dL (ref 0–200)
HDL: 31.9 mg/dL — ABNORMAL LOW (ref >39.00)
LDL Cholesterol: 69 mg/dL (ref 0–99)
NonHDL: 92
Total CHOL/HDL Ratio: 4
Triglycerides: 113 mg/dL (ref 0.0–149.0)
VLDL: 22.6 mg/dL (ref 0.0–40.0)

## 2023-05-11 LAB — PSA, MEDICARE: PSA: 1.6 ng/ml (ref 0.10–4.00)

## 2023-05-11 NOTE — Patient Instructions (Signed)
Nice to see you today I will be in touch with the labs once I have reviewed them  Follow up with me in 3 months, sooner if you need me 

## 2023-05-11 NOTE — Assessment & Plan Note (Signed)
Patient currently maintained on insulin and oral antidiabetic medications.  Is followed by endocrinology.  Continue taking medication as prescribed follow-up with specialist as recommended will do urine microalbuminuria today and patient had a detailed foot exam

## 2023-05-11 NOTE — Assessment & Plan Note (Signed)
Patient is followed by cardiology currently maintained on Entresto, metoprolol, Lasix.

## 2023-05-11 NOTE — Progress Notes (Signed)
Established Patient Office Visit  Subjective   Patient ID: [name], male    DOB: 1960/12/06  Age: 62 y.o. MRN: [id number]  Chief Complaint  Patient presents with   Annual Exam    HPI  HTN: he can check it at home. States that tolerates the meds well   CHF: [facility] Dr Roy-chaudhury  COPD/OSA: pulmonology. State that he does have a CPAP and traditionally wears it  DM2: Dr Dell Ponto, endocrine. Is on insulin and oral medications  HLD: patient is followed by cardiology.  And currently maintained on atorvastatin 80 mg  MDD: followed by [facility] psychiatry.  Patient currently maintained on citalopram  Neurology Dr Sherryll Burger  for complete physical and follow up of chronic conditions.  Immunizations: -Tetanus: Completed in 2022 -Influenza: Completed this season -Shingles: zoster live  -Pneumonia: Completed   Diet: Fair diet. States that he is not eating well. States 1 meal a day. No snacks. Regular coke and water.  Exercise: No regular exercise.  Eye exam: Completes annually. Every years. Shots in the eye  Dental exam: needs updating  Colonoscopy: Completed in 02/12/2022, over due  Lung Cancer Screening: Completed in   PSA: Due  Sleep: state he will get the days and nights mixed up. He will go to sleep 5-7am and sleep to 2-3pm.       Review of Systems  Constitutional:  Negative for chills and fever.  Respiratory:  Positive for shortness of breath.   Cardiovascular:  Negative for chest pain and leg swelling.  Gastrointestinal:  Negative for abdominal pain, blood in stool, constipation, diarrhea, nausea and vomiting.       BM daily   Genitourinary:  Negative for dysuria and hematuria.  Neurological:  Positive for headaches. Negative for tingling.  Psychiatric/Behavioral:  Negative for hallucinations and suicidal ideas.       Objective:     BP 110/62   Pulse 76   Temp 97.6 F (36.4 C) (Temporal)   Ht 5\' 5"  (1.651 m)   Wt 171 lb 6.4 oz (77.7 kg)   BMI 28.52 kg/m   BP Readings from Last 3 Encounters:  05/11/23 110/62  04/15/23 122/68  03/24/23 122/80   Wt Readings from Last 3 Encounters:  05/11/23 171 lb 6.4 oz (77.7 kg)  04/15/23 175 lb (79.4 kg)  03/24/23 176 lb (79.8 kg)      Physical Exam Vitals and nursing note reviewed.  Constitutional:      Appearance: Normal appearance.  HENT:     Right Ear: Tympanic membrane, ear canal and external ear normal.     Left Ear: Tympanic membrane, ear canal and external ear normal.     Mouth/Throat:     Mouth: Mucous membranes are moist.     Pharynx: Oropharynx is clear.  Eyes:     Extraocular Movements: Extraocular movements intact.     Pupils: Pupils are equal, round, and reactive to light.  Cardiovascular:     Rate and Rhythm: Normal rate and regular rhythm.     Pulses: Normal pulses.     Heart sounds: Normal heart sounds.  Pulmonary:     Effort: Pulmonary effort is normal.     Breath sounds: Normal breath sounds.  Abdominal:     General: Bowel sounds are normal. There is no distension.     Palpations: There is no mass.     Tenderness: There is no abdominal tenderness.     Hernia: No hernia is present.  Musculoskeletal:  Right lower leg: No edema.     Left lower leg: No edema.  Lymphadenopathy:     Cervical: No cervical adenopathy.  Skin:    General: Skin is warm.  Neurological:     General: No focal deficit present.     Mental Status: He is alert.     Deep Tendon Reflexes:     Reflex Scores:      Bicep reflexes are 2+ on the right side and 2+ on the left side.      Patellar reflexes are 2+ on the right side and 2+ on the left side.    Comments: Bilateral upper and lower extremity strength 5/5  Psychiatric:        Mood and Affect: Mood normal.        Behavior: Behavior normal.        Thought Content: Thought content normal.        Judgment: Judgment normal.      No results found for any visits on 05/11/23.    The ASCVD Risk score (Arnett DK, et al., 2019) failed to  calculate for the following reasons:   The patient has a prior MI or stroke diagnosis    Assessment & Plan:   Problem List Items Addressed This Visit       Cardiovascular and Mediastinum   Essential hypertension    Patient is followed by cardiology currently maintained on Entresto, metoprolol, Lasix.      HFrEF (heart failure with reduced ejection fraction) (HCC)    Patient is followed by cardiology continue following with specialist euvolemic in office        Respiratory   COPD (chronic obstructive pulmonary disease) (HCC)    Patient is followed by pulmonology is on Breztri, albuterol and uses a CPAP for OSA.  Continue taking medication as prescribed follow-up with specialist as recommended.        Endocrine   Type 2 diabetes mellitus with other specified complication [facility])    Patient currently maintained on insulin and oral antidiabetic medications.  Is followed by endocrinology.  Continue taking medication as prescribed follow-up with specialist as recommended will do urine microalbuminuria today and patient had a detailed foot exam      Relevant Orders   Microalbumin / creatinine urine ratio     Other   Tobacco abuse    Patient is cut down on amount of smoking he still smoking check urine microscopy for microscopic hematuria.  Patient is part of the LDCT program and is up-to-date      Relevant Orders   Urine Microscopic   S/P CABG x 3    Followed by cardiology currently on high intensity statin.      Relevant Orders   Lipid panel   Preventative health care - Primary    Discussed age-appropriate immunizations and screening exams.  Did review patient's personal, surgical, social, family histories.  Patient is up-to-date on all age related vaccinations.  Patient is up-to-date for CRC screening he does need a repeat this year.  Will do PSA for prostate cancer screening.  Patient is up-to-date on lung cancer screening.  Patient was given information at discharge about  preventative healthcare maintenance with anticipatory guidance.      Relevant Orders   CBC   Comprehensive metabolic panel   Other Visit Diagnoses     Encounter for HIV (human immunodeficiency virus) test       Relevant Orders   HIV antibody (with reflex)   Encounter for hepatitis  C screening test for low risk patient       Relevant Orders   Hepatitis C Antibody   Screening for prostate cancer       Relevant Orders   PSA, Medicare       Return in about 3 months (around 08/11/2023) for chronic condition reheck/lightheadedness .    Audria Nine, NP

## 2023-05-11 NOTE — Assessment & Plan Note (Signed)
Patient is followed by cardiology continue following with specialist euvolemic in office

## 2023-05-11 NOTE — Assessment & Plan Note (Signed)
Discussed age-appropriate immunizations and screening exams.  Did review patient's personal, surgical, social, family histories.  Patient is up-to-date on all age related vaccinations.  Patient is up-to-date for CRC screening he does need a repeat this year.  Will do PSA for prostate cancer screening.  Patient is up-to-date on lung cancer screening.  Patient was given information at discharge about preventative healthcare maintenance with anticipatory guidance.

## 2023-05-11 NOTE — Assessment & Plan Note (Signed)
Patient is cut down on amount of smoking he still smoking check urine microscopy for microscopic hematuria.  Patient is part of the LDCT program and is up-to-date

## 2023-05-11 NOTE — Assessment & Plan Note (Signed)
Followed by cardiology currently on high intensity statin.

## 2023-05-11 NOTE — Assessment & Plan Note (Signed)
Patient is followed by pulmonology is on Breztri, albuterol and uses a CPAP for OSA.  Continue taking medication as prescribed follow-up with specialist as recommended.

## 2023-05-12 ENCOUNTER — Telehealth: Payer: Self-pay

## 2023-05-12 LAB — HEPATITIS C ANTIBODY: Hepatitis C Ab: NONREACTIVE

## 2023-05-12 LAB — HIV ANTIBODY (ROUTINE TESTING W REFLEX): HIV 1&2 Ab, 4th Generation: NONREACTIVE

## 2023-05-12 NOTE — Telephone Encounter (Signed)
Form faxed as requested to pulmonology

## 2023-05-12 NOTE — Telephone Encounter (Signed)
Order received placed in your box for review. Looks like patient sees Pulmonology should order have been sent to that office?

## 2023-05-12 NOTE — Telephone Encounter (Signed)
Yes please fax to patients pulmonary office

## 2023-05-18 DIAGNOSIS — R519 Headache, unspecified: Secondary | ICD-10-CM | POA: Diagnosis not present

## 2023-05-18 DIAGNOSIS — G2581 Restless legs syndrome: Secondary | ICD-10-CM | POA: Diagnosis not present

## 2023-05-18 DIAGNOSIS — G250 Essential tremor: Secondary | ICD-10-CM | POA: Diagnosis not present

## 2023-05-18 DIAGNOSIS — R2689 Other abnormalities of gait and mobility: Secondary | ICD-10-CM | POA: Diagnosis not present

## 2023-05-18 DIAGNOSIS — I959 Hypotension, unspecified: Secondary | ICD-10-CM | POA: Diagnosis not present

## 2023-05-24 DIAGNOSIS — E785 Hyperlipidemia, unspecified: Secondary | ICD-10-CM | POA: Diagnosis not present

## 2023-05-24 DIAGNOSIS — I255 Ischemic cardiomyopathy: Secondary | ICD-10-CM | POA: Diagnosis not present

## 2023-05-24 DIAGNOSIS — I5022 Chronic systolic (congestive) heart failure: Secondary | ICD-10-CM | POA: Diagnosis not present

## 2023-05-24 DIAGNOSIS — I5020 Unspecified systolic (congestive) heart failure: Secondary | ICD-10-CM | POA: Diagnosis not present

## 2023-05-24 DIAGNOSIS — I2510 Atherosclerotic heart disease of native coronary artery without angina pectoris: Secondary | ICD-10-CM | POA: Diagnosis not present

## 2023-05-24 DIAGNOSIS — I10 Essential (primary) hypertension: Secondary | ICD-10-CM | POA: Diagnosis not present

## 2023-06-04 DIAGNOSIS — A419 Sepsis, unspecified organism: Secondary | ICD-10-CM | POA: Diagnosis not present

## 2023-06-05 DIAGNOSIS — E119 Type 2 diabetes mellitus without complications: Secondary | ICD-10-CM | POA: Diagnosis not present

## 2023-06-05 DIAGNOSIS — A419 Sepsis, unspecified organism: Secondary | ICD-10-CM | POA: Diagnosis not present

## 2023-06-07 DIAGNOSIS — Z794 Long term (current) use of insulin: Secondary | ICD-10-CM | POA: Diagnosis not present

## 2023-06-07 DIAGNOSIS — E1165 Type 2 diabetes mellitus with hyperglycemia: Secondary | ICD-10-CM | POA: Diagnosis not present

## 2023-06-27 ENCOUNTER — Other Ambulatory Visit: Payer: Self-pay | Admitting: Nurse Practitioner

## 2023-06-29 NOTE — Telephone Encounter (Signed)
LAST APPOINTMENT DATE: 05/11/23   NEXT APPOINTMENT DATE: 08/11/2023  Gabapentin 300mg    LAST REFILL: 12/24/22  QTY: [phone] [id number]

## 2023-07-05 ENCOUNTER — Encounter: Payer: Self-pay | Admitting: Pulmonary Disease

## 2023-07-05 ENCOUNTER — Ambulatory Visit (INDEPENDENT_AMBULATORY_CARE_PROVIDER_SITE_OTHER): Payer: 59 | Admitting: Pulmonary Disease

## 2023-07-05 VITALS — BP 118/70 | HR 65 | Ht 65.0 in | Wt 180.2 lb

## 2023-07-05 DIAGNOSIS — L89520 Pressure ulcer of left ankle, unstageable: Secondary | ICD-10-CM

## 2023-07-05 DIAGNOSIS — J9611 Chronic respiratory failure with hypoxia: Secondary | ICD-10-CM

## 2023-07-05 DIAGNOSIS — M869 Osteomyelitis, unspecified: Secondary | ICD-10-CM

## 2023-07-05 DIAGNOSIS — I480 Paroxysmal atrial fibrillation: Secondary | ICD-10-CM | POA: Diagnosis not present

## 2023-07-05 DIAGNOSIS — L97514 Non-pressure chronic ulcer of other part of right foot with necrosis of bone: Secondary | ICD-10-CM

## 2023-07-05 DIAGNOSIS — E119 Type 2 diabetes mellitus without complications: Secondary | ICD-10-CM | POA: Diagnosis not present

## 2023-07-05 DIAGNOSIS — Z89422 Acquired absence of other left toe(s): Secondary | ICD-10-CM | POA: Diagnosis not present

## 2023-07-05 DIAGNOSIS — F339 Major depressive disorder, recurrent, unspecified: Secondary | ICD-10-CM

## 2023-07-05 DIAGNOSIS — A419 Sepsis, unspecified organism: Secondary | ICD-10-CM | POA: Diagnosis not present

## 2023-07-05 DIAGNOSIS — D696 Thrombocytopenia, unspecified: Secondary | ICD-10-CM

## 2023-07-05 DIAGNOSIS — G4733 Obstructive sleep apnea (adult) (pediatric): Secondary | ICD-10-CM | POA: Diagnosis not present

## 2023-07-05 MED ORDER — AZELASTINE HCL 0.1 % NA SOLN: 1.0000 | Freq: Two times a day (BID) | NASAL | 12 refills | Status: DC

## 2023-07-05 MED ORDER — CETIRIZINE HCL 10 MG PO TABS: 10.0000 mg | ORAL_TABLET | Freq: Every day | ORAL | 5 refills | Status: DC

## 2023-07-05 NOTE — Patient Instructions (Signed)
Zyrtec-10 mg nightly  Astepro 2 sprays each nostril daily  Continue using Flonase  Switch back to a fullface mask  Call us with significant concerns  I will see you back in about 6 weeks  Try and get back to using your CPAP on a nightly basis  If symptoms do not improve despite above, we may need to consider sending you to an ENT doctor

## 2023-07-05 NOTE — Progress Notes (Signed)
[name]    [id number]    1961-08-12  Primary Care Physician:Cable, Genene Churn, NP  Referring Physician: Eden Emms, NP [address] [place],  [place] [address]  Chief complaint:   Patient with a history of obstructive sleep apnea History of obstructive lung disease  HPI:  Diagnosed with severe obstructive sleep apnea for which he was on CPAP therapy -Has not been able to use CPAP in the last 2 to 3 months because of nasal stuffiness and congestion, does have nasal pillows  Currently uses Flonase to help with nasal stuffiness but this does not seem to be helping  I did discuss use of allergy medications and addition of Astelin  The importance of using CPAP therapy was discussed with the patient as he does have severe obstructive sleep apnea with severe oxygen desaturations  Has a history of obstructive lung disease  He was able to quit smoking for a little while recently for about a month but is back up to smoking about 1 and half packs a day  He was hospitalized in October 2023 for respiratory failure, pneumonia  Has underlying history of atrial fibrillation, diabetes, coronary artery disease  It was prescribed Breztri which he does not remember to use all the time  He was having significant enough difficulty with using the CPAP and felt it was actually keeping him from being able to get enough rest  Outpatient Encounter Medications as of 07/05/2023  Medication Sig   HUMALOG KWIKPEN 100 UNIT/ML KwikPen Inject 8 Units into the skin 3 (three) times daily.   LANTUS SOLOSTAR 100 UNIT/ML Solostar Pen Inject 36 Units into the skin 2 (two) times daily.   acetaminophen (TYLENOL) 500 MG tablet Take 1,000 mg by mouth every 6 (six) hours as needed for headache.   albuterol (VENTOLIN HFA) 108 (90 Base) MCG/ACT inhaler Inhale 1-2 puffs into the lungs every 6 (six) hours as needed for wheezing or shortness of breath.   aspirin 81 MG chewable tablet Chew 1 tablet (81  mg total) by mouth daily.   atorvastatin (LIPITOR) 80 MG tablet Take 1 tablet (80 mg total) by mouth at bedtime.   Budeson-Glycopyrrol-Formoterol (BREZTRI AEROSPHERE) 160-9-4.8 MCG/ACT AERO Inhale 2 puffs into the lungs in the morning and at bedtime. (Patient taking differently: Inhale 2 puffs into the lungs in the morning and at bedtime. Not taking daily)   citalopram (CELEXA) 20 MG tablet Take 30 mg by mouth daily.   ezetimibe (ZETIA) 10 MG tablet Take 1 tablet by mouth daily.   furosemide (LASIX) 20 MG tablet Take 1 tablet (20 mg total) by mouth daily. (Patient taking differently: Take 20 mg by mouth daily as needed.)   gabapentin (NEURONTIN) 300 MG capsule TAKE 2 CAPSULES BY MOUTH AT BEDTIME   glucose blood test strip 1 each by Other route as needed for other. For one touch ultra meter. DX: E11.9   insulin isophane & regular human KwikPen (HUMULIN 70/30 KWIKPEN) (70-30) 100 UNIT/ML KwikPen Inject 50-60 Units into the skin See admin instructions. Please take 50 units every morning and 60 units with dinner   Insulin Pen Needle 32G X 4 MM MISC Use to inject Insulin daily. Dx: E11.9   Insulin Syringe-Needle U-100 (INSULIN SYRINGE .3CC/31GX5/16") 31G X 5/16" 0.3 ML MISC 1 Units by Does not apply route 2 (two) times daily.   Lancets (ONETOUCH ULTRASOFT) lancets Use as instructed   metoprolol succinate (TOPROL-XL) 100 MG 24 hr  tablet Take 100 mg by mouth daily.   nitroGLYCERIN (NITROSTAT) 0.4 MG SL tablet Place 1 tablet (0.4 mg total) under the tongue every 5 (five) minutes as needed for chest pain. (Patient not taking: Reported on 05/11/2023)   rOPINIRole (REQUIP) 0.25 MG tablet Take 0.25 mg by mouth at bedtime.   sacubitril-valsartan (ENTRESTO) 24-26 MG Take 0.5 tablets by mouth 2 (two) times daily.   sitaGLIPtin (JANUVIA) 100 MG tablet Take 1 tablet by mouth daily.   [DISCONTINUED] nicotine (NICODERM CQ - DOSED IN MG/24 HOURS) 21 mg/24hr patch Place 1 patch (21 mg total) onto the skin daily. (Patient  not taking: Reported on 05/11/2023)   No facility-administered encounter medications on file as of 07/05/2023.    Allergies as of 07/05/2023 - Review Complete 07/05/2023  Allergen Reaction Noted   Latex Rash 06/16/2016    Past Medical History:  Diagnosis Date   Abrasion L FOREARM   Atrial fibrillation (HCC)    a. initially occurring in post-op setting in 05/2016, started on Eliquis b. 08/2016: s/p clipping of atrial appendage at time of CABG --> Eliquis switched to Coumadin   CAD (coronary artery disease)    a. 05/2016: NSTEMI, cath showing 3v disease with CABG recommended once MRSA infection resolved. b. 08/2016: CABG with LIMA-LAD, SVG-D1, and SVG-RI.   Cancer (HCC)    basal cell carcinoma of the skin   Diabetes mellitus without complication (HCC)    Headache(784.0)    Hematuria, microscopic "SINCE I WAS A KID"   Hyperlipidemia    Hypertension    Ischemic cardiomyopathy    a. 05/2016: echo w/ EF of 25-35% and akinesis of the basal-midinferolateral and inferior myocardium.   Lactose intolerance    Myocardial infarction (HCC)    Respiratory failure (HCC)    Rotator cuff tear RIGHT   Tobacco use    a. quit in 05/2016 following MI    Past Surgical History:  Procedure Laterality Date   AMPUTATION TOE Left 02/06/2020   5th ray amputation with graph [facility])   BRONCHIAL BRUSHINGS  08/01/2022   Procedure: BRONCHIAL BRUSHINGS;  Surgeon: Lorin Glass, MD;  Location: [facility] ENDOSCOPY;  Service: Pulmonary;;   BRONCHIAL WASHINGS  08/01/2022   Procedure: BRONCHIAL WASHINGS;  Surgeon: Lorin Glass, MD;  Location[facility] ENDOSCOPY;  Service: Pulmonary;;   CARDIAC CATHETERIZATION N/A 06/16/2016   Procedure: Left Heart Cath and Coronary Angiography;  Surgeon: Lennette Bihari, MD;  Location: [facility] INVASIVE CV LAB;  Service: Cardiovascular;  Laterality: N/A;   CLIPPING OF ATRIAL APPENDAGE N/A 08/24/2016   Procedure: CLIPPING OF ATRIAL APPENDAGE;  Surgeon: Kerin Perna, MD;  Location: [facility] OR;   Service: Open Heart Surgery;  Laterality: N/A;   COLONOSCOPY WITH PROPOFOL N/A 02/12/2022   Procedure: COLONOSCOPY WITH PROPOFOL;  Surgeon: Midge Minium, MD;  Location: [facility] ENDOSCOPY;  Service: Endoscopy;  Laterality: N/A;   CORONARY ARTERY BYPASS GRAFT N/A 08/24/2016   Procedure: CORONARY ARTERY BYPASS GRAFTING (CABG) x three,  using left internal mammary artery and right leg greater saphenous vein harvested endoscopically;  Surgeon: Kerin Perna, MD;  Location: [facility] OR;  Service: Open Heart Surgery;  Laterality: N/A;   HERNIA REPAIR  X2   INCISION AND DRAINAGE ABSCESS Left 06/10/2016   Procedure: INCISION AND DRAINAGE LEFT AXILLARY ABSCESS;  Surgeon: Chevis Pretty III, MD;  Location: [facility] ORS;  Service: General;  Laterality: Left;   ROTATOR CUFF REPAIR Bilateral    TEE WITHOUT CARDIOVERSION N/A 08/24/2016   Procedure: TRANSESOPHAGEAL ECHOCARDIOGRAM (TEE);  Surgeon: Kerin Perna, MD;  Location: [facility] OR;  Service: Open Heart Surgery;  Laterality: N/A;   VIDEO BRONCHOSCOPY Right 08/01/2022   Procedure: VIDEO BRONCHOSCOPY WITH FLUORO;  Surgeon: Lorin Glass, MD;  Location: [facility] ENDOSCOPY;  Service: Pulmonary;  Laterality: Right;    Family History  Problem Relation Age of Onset   Hypertension Mother    Breast cancer Mother    Hypertension Father    Sudden death Father 84       at age of death   Diabetes Father    Heart attack Father    Heart attack Sister    Cancer Maternal Aunt        breast x2 aunts    Social History   Socioeconomic History   Marital status: Married    Spouse name: Not on file   Number of children: Not on file   Years of education: Not on file   Highest education level: Not on file  Occupational History   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  Smoking a pack per day.  Trying to cut back.  01/12/2023 hfb  Substance and Sexual Activity   Alcohol use: No   Drug use: No   Sexual activity: Not on file  Other Topics Concern   Not on file  Social History Narrative   Not on file   Social Determinants of Health   Financial Resource Strain: Low Risk  (08/10/2022)   Overall Financial Resource Strain (CARDIA)    Difficulty of Paying Living Expenses: Not hard at all  Food Insecurity: No Food Insecurity (08/10/2022)   Hunger Vital Sign    Worried About Running Out of Food in the Last Year: Never true    Ran Out of Food in the Last Year: Never true  Transportation Needs: No Transportation Needs (08/10/2022)   PRAPARE - Administrator, Civil Service (Medical): No    Lack of Transportation (Non-Medical): No  Physical Activity: Insufficiently Active (08/10/2022)   Exercise Vital Sign    Days of Exercise per Week: 2 days    Minutes of Exercise per Session: 20 min  Stress: No Stress Concern Present (08/10/2022)   [employer] of Occupational Health - Occupational Stress Questionnaire    Feeling of Stress : Only a little  Social Connections: Socially Isolated (08/10/2022)   Social Connection and Isolation Panel [NHANES]    Frequency of Communication with Friends and Family: More than three times a week    Frequency of Social Gatherings with Friends and Family: More than three times a week    Attends Religious Services: Never    Database administrator or Organizations: No    Attends Banker Meetings: Never    Marital Status: Separated  Intimate Partner Violence: Not At Risk (08/10/2022)   Humiliation, Afraid, Rape, and Kick questionnaire    Fear of Current or Ex-Partner: No    Emotionally Abused: No    Physically Abused: No    Sexually Abused: No    Review of Systems  Respiratory:  Positive for apnea and shortness of breath.   Psychiatric/Behavioral:  Positive for sleep  disturbance.     Vitals:   07/05/23  1104  BP: 118/70  Pulse: 65  SpO2: 95%     Physical Exam Constitutional:      Appearance: Normal appearance.  HENT:     Head: Normocephalic.     Mouth/Throat:     Mouth: Mucous membranes are moist.  Eyes:     General: No scleral icterus. Cardiovascular:     Rate and Rhythm: Normal rate and regular rhythm.     Heart sounds: No murmur heard.    No friction rub.  Pulmonary:     Effort: No respiratory distress.     Breath sounds: No stridor. No wheezing or rhonchi.  Musculoskeletal:     Cervical back: No rigidity or tenderness.  Neurological:     Mental Status: He is alert.  Psychiatric:        Mood and Affect: Mood normal.    Data Reviewed: Pulmonary function test reveals moderate obstruction with significant bronchodilator response  Sleep study 01/17/2021 with AHI of 56, O2 nadir of 42%  Assessment:  Severe obstructive sleep apnea -Not currently on CPAP therapy  Moderate obstructive lung disease -On Breztri  Active smoker  Smoking cessation counseling  Nasal stuffiness and congestion prevented him from being able to use his CPAP at present -Zyrtec 10 mg nightly -Astepro 2 sprays each nostril daily in addition to Flonase  Plan/Recommendations:  Encouraged to try to get back to using CPAP on a nightly basis  Trial with Zyrtec and Astepro in addition to Flonase to help with nasal stuffiness  Continue Breztri  Follow-up in about 6 weeks to see how he is doing  May need to consider ENT referral if still significantly stuffy  Encouraged to call with significant concerns Risk with not treating sleep disordered breathing discussed with patient  Virl Diamond MD [facility] 07/05/2023, 11:09 AM  CC: Eden Emms, NP

## 2023-07-07 ENCOUNTER — Ambulatory Visit: Payer: 59 | Admitting: Podiatry

## 2023-07-07 DIAGNOSIS — E113412 Type 2 diabetes mellitus with severe nonproliferative diabetic retinopathy with macular edema, left eye: Secondary | ICD-10-CM | POA: Diagnosis not present

## 2023-07-12 DIAGNOSIS — I2510 Atherosclerotic heart disease of native coronary artery without angina pectoris: Secondary | ICD-10-CM | POA: Diagnosis not present

## 2023-07-12 DIAGNOSIS — Z794 Long term (current) use of insulin: Secondary | ICD-10-CM | POA: Diagnosis not present

## 2023-07-12 DIAGNOSIS — E1165 Type 2 diabetes mellitus with hyperglycemia: Secondary | ICD-10-CM | POA: Diagnosis not present

## 2023-07-26 DIAGNOSIS — L538 Other specified erythematous conditions: Secondary | ICD-10-CM | POA: Diagnosis not present

## 2023-07-26 DIAGNOSIS — Z789 Other specified health status: Secondary | ICD-10-CM | POA: Diagnosis not present

## 2023-07-26 DIAGNOSIS — Z85828 Personal history of other malignant neoplasm of skin: Secondary | ICD-10-CM | POA: Diagnosis not present

## 2023-07-26 DIAGNOSIS — L814 Other melanin hyperpigmentation: Secondary | ICD-10-CM | POA: Diagnosis not present

## 2023-07-26 DIAGNOSIS — L2989 Other pruritus: Secondary | ICD-10-CM | POA: Diagnosis not present

## 2023-07-26 DIAGNOSIS — L820 Inflamed seborrheic keratosis: Secondary | ICD-10-CM | POA: Diagnosis not present

## 2023-07-26 DIAGNOSIS — R208 Other disturbances of skin sensation: Secondary | ICD-10-CM | POA: Diagnosis not present

## 2023-07-26 DIAGNOSIS — L821 Other seborrheic keratosis: Secondary | ICD-10-CM | POA: Diagnosis not present

## 2023-07-26 DIAGNOSIS — D225 Melanocytic nevi of trunk: Secondary | ICD-10-CM | POA: Diagnosis not present

## 2023-07-26 DIAGNOSIS — Z08 Encounter for follow-up examination after completed treatment for malignant neoplasm: Secondary | ICD-10-CM | POA: Diagnosis not present

## 2023-07-28 ENCOUNTER — Ambulatory Visit: Payer: 59 | Admitting: Podiatry

## 2023-07-30 DIAGNOSIS — E113511 Type 2 diabetes mellitus with proliferative diabetic retinopathy with macular edema, right eye: Secondary | ICD-10-CM | POA: Diagnosis not present

## 2023-08-03 ENCOUNTER — Encounter: Payer: Self-pay | Admitting: Nurse Practitioner

## 2023-08-03 ENCOUNTER — Ambulatory Visit (INDEPENDENT_AMBULATORY_CARE_PROVIDER_SITE_OTHER)
Admission: RE | Admit: 2023-08-03 | Discharge: 2023-08-03 | Disposition: A | Discharge status: Home / Self Care | Payer: 59 | Source: Ambulatory Visit | Attending: Nurse Practitioner | Admitting: Nurse Practitioner

## 2023-08-03 ENCOUNTER — Ambulatory Visit (INDEPENDENT_AMBULATORY_CARE_PROVIDER_SITE_OTHER): Payer: 59 | Admitting: Nurse Practitioner

## 2023-08-03 VITALS — BP 132/82 | HR 66 | Temp 98.0°F | Ht 65.0 in | Wt 175.6 lb

## 2023-08-03 DIAGNOSIS — R051 Acute cough: Secondary | ICD-10-CM

## 2023-08-03 DIAGNOSIS — J449 Chronic obstructive pulmonary disease, unspecified: Secondary | ICD-10-CM | POA: Diagnosis not present

## 2023-08-03 DIAGNOSIS — J441 Chronic obstructive pulmonary disease with (acute) exacerbation: Secondary | ICD-10-CM

## 2023-08-03 DIAGNOSIS — H5461 Unqualified visual loss, right eye, normal vision left eye: Secondary | ICD-10-CM | POA: Diagnosis not present

## 2023-08-03 LAB — CBC WITH DIFFERENTIAL/PLATELET
Basophils Absolute: 0.1 10*3/uL (ref 0.0–0.1)
Basophils Relative: 0.6 % (ref 0.0–3.0)
Eosinophils Absolute: 0.4 10*3/uL (ref 0.0–0.7)
Eosinophils Relative: 4.5 % (ref 0.0–5.0)
HCT: 43.1 % (ref 39.0–52.0)
Hemoglobin: 13.8 g/dL (ref 13.0–17.0)
Lymphocytes Relative: 20.2 % (ref 12.0–46.0)
Lymphs Abs: 1.9 10*3/uL (ref 0.7–4.0)
MCHC: 32 g/dL (ref 30.0–36.0)
MCV: 89.7 fL (ref 78.0–100.0)
Monocytes Absolute: 0.9 10*3/uL (ref 0.1–1.0)
Monocytes Relative: 9.9 % (ref 3.0–12.0)
Neutro Abs: 6 10*3/uL (ref 1.4–7.7)
Neutrophils Relative %: 64.8 % (ref 43.0–77.0)
Platelets: 124 10*3/uL — ABNORMAL LOW (ref 150.0–400.0)
RBC: 4.8 Mil/uL (ref 4.22–5.81)
RDW: 15.1 % (ref 11.5–15.5)
WBC: 9.2 10*3/uL (ref 4.0–10.5)

## 2023-08-03 LAB — POC COVID19 BINAXNOW: SARS Coronavirus 2 Ag: NEGATIVE

## 2023-08-03 LAB — SEDIMENTATION RATE: Sed Rate: 25 mm/h — ABNORMAL HIGH (ref 0–20)

## 2023-08-03 LAB — HIGH SENSITIVITY CRP: CRP, High Sensitivity: 4.74 mg/L (ref 0.000–5.000)

## 2023-08-03 MED ORDER — PREDNISONE 20 MG PO TABS
ORAL_TABLET | ORAL | 0 refills | Status: AC
Start: 2023-08-03 — End: 2023-08-11

## 2023-08-03 MED ORDER — AMOXICILLIN-POT CLAVULANATE 875-125 MG PO TABS
1.0000 | ORAL_TABLET | Freq: Two times a day (BID) | ORAL | 0 refills | Status: AC
Start: 2023-08-03 — End: 2023-08-10

## 2023-08-03 NOTE — Assessment & Plan Note (Addendum)
COVID test negative in office.  Will like to treat for COPD exacerbation.  Will start patient on Augmentin 875-125 mg twice daily for 7 days.  Also do steroid taper.  Patient avoid NSAIDs he will reach out to his endocrinologist for prednisone insulin dose correction  Patient has a history of acute respiratory failure.  He does have oxygen at home if he needs it.  Patient does have the ability to check pulse ox at home.  Informed patient if he is below 90% on pulse ox consistently use oxygen as needed

## 2023-08-03 NOTE — Patient Instructions (Signed)
Nice to see you today Take the medications that I sent to the pharmacy and avoid Nsaids Follow up with me as scheduled Reach out to endocrine for insulin adjustments while on steroids

## 2023-08-03 NOTE — Progress Notes (Signed)
Acute Office Visit  Subjective:     Patient ID: [name], male    DOB: 03-11-1961, 62 y.o.   MRN: [id number]  Chief Complaint  Patient presents with   Cough    Pt complains of congestion and drainage that started on friday. Pt states he is not able to sleep at night.  Slight chest pain. Pt states Mucus color is brown and dark. No Covid exposure but was around sick grandkids.      Patient is in today for cough with a history of DM2, COPD, CHF, HTN, HLD  Symptoms started this past Friday States the grandkids had colds No covid vaccines PNA: utd No flu vaccine  Has been using mucinex, tylenol, astilin. Without relif. States short term belief  No more shortness of breath than normal Does have s[utum production traditionally does not . States that it is brown and yellow color. States that he was 7.9 A1C    Review of Systems  Constitutional:  Negative for chills and fever.  HENT:  Positive for sore throat (improving). Negative for ear discharge and ear pain.   Respiratory:  Positive for cough, sputum production and shortness of breath (baselin).   Cardiovascular:  Negative for chest pain (tightness).  Gastrointestinal:  Positive for diarrhea (baseline). Negative for abdominal pain, nausea and vomiting.  Musculoskeletal:  Negative for joint pain and myalgias.  Neurological:  Positive for dizziness and headaches (post tussive).        Objective:    BP 132/82   Pulse 66   Temp 98 F (36.7 C) (Oral)   Ht 5\' 5"  (1.651 m)   Wt 175 lb 9.6 oz (79.7 kg)   SpO2 91%   BMI 29.22 kg/m  BP Readings from Last 3 Encounters:  08/03/23 132/82  07/05/23 118/70  05/11/23 110/62   Wt Readings from Last 3 Encounters:  08/03/23 175 lb 9.6 oz (79.7 kg)  07/05/23 180 lb 3.2 oz (81.7 kg)  05/11/23 171 lb 6.4 oz (77.7 kg)   SpO2 Readings from Last 3 Encounters:  08/03/23 91%  07/05/23 95%  04/15/23 96%      Physical Exam Vitals and nursing note reviewed.  Constitutional:       Appearance: Normal appearance.  HENT:     Right Ear: Tympanic membrane, ear canal and external ear normal.     Left Ear: Tympanic membrane, ear canal and external ear normal.     Nose:     Right Sinus: Frontal sinus tenderness present. No maxillary sinus tenderness.     Left Sinus: Frontal sinus tenderness present. No maxillary sinus tenderness.     Mouth/Throat:     Mouth: Mucous membranes are moist.  Eyes:     Extraocular Movements: Extraocular movements intact.     Pupils: Pupils are equal, round, and reactive to light.  Cardiovascular:     Rate and Rhythm: Normal rate and regular rhythm.     Heart sounds: Normal heart sounds.  Pulmonary:     Effort: Pulmonary effort is normal.     Breath sounds: Normal breath sounds.  Neurological:     Mental Status: He is alert.     No results found for any visits on 08/03/23.      Assessment & Plan:   Problem List Items Addressed This Visit       Respiratory   COPD exacerbation (HCC) - Primary    COVID test negative in office.  Will like to treat for COPD exacerbation.  Will  start patient on Augmentin 875-125 mg twice daily for 7 days.  Also do steroid taper.  Patient avoid NSAIDs he will reach out to his endocrinologist for prednisone insulin dose correction  Patient has a history of acute respiratory failure.  He does have oxygen at home if he needs it.  Patient does have the ability to check pulse ox at home.  Informed patient if he is below 90% on pulse ox consistently use oxygen as needed      Relevant Medications   amoxicillin-clavulanate (AUGMENTIN) 875-125 MG tablet   predniSONE (DELTASONE) 20 MG tablet   Other Relevant Orders   DG Chest 2 View (Completed)     Other   Acute cough   Relevant Orders   POC COVID-19   Vision loss of right eye    Patient is seen Dr. Laverna Peace at [facility] eye.  She wanted patient had a CBC with differential, CRP, sed rate done I ordered these as a courtesy for the patient and will fax  results to her once they result      Relevant Orders   CBC with Differential/Platelet   High sensitivity CRP   Sedimentation rate    Meds ordered this encounter  Medications   amoxicillin-clavulanate (AUGMENTIN) 875-125 MG tablet    Sig: Take 1 tablet by mouth 2 (two) times daily for 7 days.    Dispense:  14 tablet    Refill:  0    Order Specific Question:   Supervising Provider    Answer:   Milinda Antis, MARNE A [[id number]]   predniSONE (DELTASONE) 20 MG tablet    Sig: Take 2 tablets (40 mg total) by mouth daily with breakfast for 4 days, THEN 1 tablet (20 mg total) daily with breakfast for 4 days.    Dispense:  12 tablet    Refill:  0    Order Specific Question:   Supervising Provider    Answer:   Roxy Manns A [[id number]]    Return if symptoms worsen or fail to improve, for As scheduled .  Audria Nine, NP

## 2023-08-03 NOTE — Assessment & Plan Note (Signed)
Patient is seen Dr. Laverna Peace at [facility] eye.  She wanted patient had a CBC with differential, CRP, sed rate done I ordered these as a courtesy for the patient and will fax results to her once they result

## 2023-08-05 DIAGNOSIS — H47391 Other disorders of optic disc, right eye: Secondary | ICD-10-CM | POA: Diagnosis not present

## 2023-08-11 ENCOUNTER — Ambulatory Visit: Payer: 59 | Admitting: Nurse Practitioner

## 2023-08-11 ENCOUNTER — Ambulatory Visit (INDEPENDENT_AMBULATORY_CARE_PROVIDER_SITE_OTHER): Payer: 59 | Admitting: Podiatry

## 2023-08-11 ENCOUNTER — Encounter: Payer: Self-pay | Admitting: Podiatry

## 2023-08-11 VITALS — BP 144/74 | HR 71

## 2023-08-11 DIAGNOSIS — M79609 Pain in unspecified limb: Secondary | ICD-10-CM

## 2023-08-11 DIAGNOSIS — L84 Corns and callosities: Secondary | ICD-10-CM

## 2023-08-11 DIAGNOSIS — B351 Tinea unguium: Secondary | ICD-10-CM

## 2023-08-11 DIAGNOSIS — E1142 Type 2 diabetes mellitus with diabetic polyneuropathy: Secondary | ICD-10-CM

## 2023-08-11 NOTE — Progress Notes (Signed)
Subjective:  Patient ID: [name], male    DOB: [birth year],  MRN: [id number]  Chief Complaint  Patient presents with   Diabetes    "Clean up and clip my toenails."    62 y.o. male presents with the above complaint. History confirmed with patient.  He returns for follow-up.  A1c has increased due to med changes. Nails painful thick and elongated, calluses built up again as well.   Objective:  Physical Exam: warm, good capillary refill, no trophic changes or ulcerative lesions, normal DP and PT pulses, and he has an abnormal sensory exam with loss of protective sensation. Left Foot: dystrophic yellowed discolored nail plates with subungual debris and fourth PIPJ callus Right Foot: dystrophic yellowed discolored nail plates with subungual debris and plantar fifth metatarsal there is a painful hyperkeratosis, pinch callus medial 1st MTPJ  Radiographs of both feet taken today show no osteomyelitis of the areas of concern of the fifth metatarsal on the right foot or left fourth toe  Assessment:   1. Pain due to onychomycosis of nail   2. Type 2 diabetes mellitus with peripheral neuropathy (HCC)   3. Callus       Plan:  Patient was evaluated and treated and all questions answered.    Discussed the etiology and treatment options for the condition in detail with the patient. Prior debridements have been helpful. Recommended debridement of the nails today. Sharp and mechanical debridement performed of all painful and mycotic nails today. Nails debrided in length and thickness using a nail nipper to level of comfort. Discussed treatment options including appropriate shoe gear. Follow up as needed for painful nails.  All symptomatic hyperkeratoses were safely debrided with a sterile #15 blade to patient's level of comfort without incident. We discussed preventative and palliative care of these lesions including supportive and accommodative shoegear, padding, prefabricated and custom  molded accommodative orthoses, use of a pumice stone and lotions/creams daily.   Return in about 3 months (around 11/11/2023) for at risk diabetic foot care.

## 2023-08-12 ENCOUNTER — Ambulatory Visit: Payer: 59 | Admitting: Nurse Practitioner

## 2023-08-13 ENCOUNTER — Ambulatory Visit (INDEPENDENT_AMBULATORY_CARE_PROVIDER_SITE_OTHER): Payer: 59 | Admitting: Nurse Practitioner

## 2023-08-13 ENCOUNTER — Encounter: Payer: Self-pay | Admitting: Nurse Practitioner

## 2023-08-13 VITALS — BP 120/82 | HR 62 | Temp 98.2°F | Ht 65.0 in | Wt 175.0 lb

## 2023-08-13 DIAGNOSIS — R7989 Other specified abnormal findings of blood chemistry: Secondary | ICD-10-CM

## 2023-08-13 DIAGNOSIS — R0602 Shortness of breath: Secondary | ICD-10-CM

## 2023-08-13 DIAGNOSIS — I10 Essential (primary) hypertension: Secondary | ICD-10-CM | POA: Diagnosis not present

## 2023-08-13 DIAGNOSIS — I5020 Unspecified systolic (congestive) heart failure: Secondary | ICD-10-CM | POA: Diagnosis not present

## 2023-08-13 DIAGNOSIS — R051 Acute cough: Secondary | ICD-10-CM | POA: Diagnosis not present

## 2023-08-13 MED ORDER — FUROSEMIDE 20 MG PO TABS: 20.0000 mg | ORAL_TABLET | Freq: Every day | ORAL | 1 refills | Status: AC | PRN

## 2023-08-13 MED ORDER — ENTRESTO 24-26 MG PO TABS: ORAL_TABLET | ORAL | Status: DC

## 2023-08-13 MED ORDER — NITROGLYCERIN 0.4 MG SL SUBL: 0.4000 mg | SUBLINGUAL_TABLET | SUBLINGUAL | 3 refills | Status: AC | PRN

## 2023-08-13 NOTE — Patient Instructions (Signed)
Nice to see you today I want to see you in 6 months, sooner if you need me If the cough does not continue to improve or you get worse let me know

## 2023-08-13 NOTE — Assessment & Plan Note (Signed)
Patient to use furosemide as needed refill provided today

## 2023-08-13 NOTE — Assessment & Plan Note (Signed)
Currently on Entresto, furosemide, metoprolol.  Blood pressure under normal limits.  Continue medication as prescribed

## 2023-08-13 NOTE — Assessment & Plan Note (Signed)
Patient has been improving.  He did finish the antibiotics and steroid taper.  Continue to monitor stable at this juncture signs and symptoms reviewed when to be seen again

## 2023-08-13 NOTE — Assessment & Plan Note (Signed)
Patient currently followed by [facility] cardiology on Entresto, Lasix as needed and metoprolol.  Continue medication as prescribed follow-up with cardiology as recommended

## 2023-08-13 NOTE — Progress Notes (Signed)
Established Patient Office Visit  Subjective   Patient ID: [name], male    DOB: 04/09/1961  Age: 62 y.o. MRN: [id number]  Chief Complaint  Patient presents with   Follow-up    Pt states that he is doing fine.     HPI  COPD exacerbation: Patient was seen by me on 08/03/2019 for cough and diagnosed with a COPD exacerbation.  Patient was treated with 7 days of Augmentin and a steroid taper.  Does have history of acute respiratory failure with oxygen at home making use of his O2 sats below 90%.  Patient is taking the antibiotics and steroid as prescribed.  He is having some increased diarrhea from his baseline.  States that he still has the cough but is feeling better and feels like it is improving.  HTN: Patient currently maintained on Entresto, metoprolol, furosemide. States he will check it when he goes out. Does not check it at home. He is on entreso 0.5 pills once a day.  Currently followed by [facility] cardiology      Review of Systems  Constitutional:  Negative for chills and fever.  Respiratory:  Negative for shortness of breath.   Cardiovascular:  Negative for chest pain.  Gastrointestinal:  Positive for diarrhea (base line worse with the antibiotic.). Negative for abdominal pain, nausea and vomiting.       BM daily  liquid most of the time  Neurological:  Negative for dizziness and headaches.  Psychiatric/Behavioral:  Negative for hallucinations and suicidal ideas.       Objective:     BP 120/82   Pulse 62   Temp 98.2 F (36.8 C) (Oral)   Ht 5\' 5"  (1.651 m)   Wt 175 lb (79.4 kg)   SpO2 95%   BMI 29.12 kg/m  BP Readings from Last 3 Encounters:  08/13/23 120/82  08/11/23 (!) 144/74  08/03/23 132/82   Wt Readings from Last 3 Encounters:  08/13/23 175 lb (79.4 kg)  08/03/23 175 lb 9.6 oz (79.7 kg)  07/05/23 180 lb 3.2 oz (81.7 kg)   SpO2 Readings from Last 3 Encounters:  08/13/23 95%  08/03/23 91%  07/05/23 95%      Physical Exam Vitals and nursing  note reviewed.  Constitutional:      Appearance: Normal appearance.  Cardiovascular:     Rate and Rhythm: Normal rate and regular rhythm.     Heart sounds: Normal heart sounds.  Pulmonary:     Effort: Pulmonary effort is normal.     Breath sounds: Normal breath sounds.  Abdominal:     General: Bowel sounds are normal.  Neurological:     Mental Status: He is alert.      No results found for any visits on 08/13/23.    The ASCVD Risk score (Arnett DK, et al., 2019) failed to calculate for the following reasons:   The patient has a prior MI or stroke diagnosis    Assessment & Plan:   Problem List Items Addressed This Visit       Cardiovascular and Mediastinum   Essential hypertension - Primary    Currently on Entresto, furosemide, metoprolol.  Blood pressure under normal limits.  Continue medication as prescribed      Relevant Medications   furosemide (LASIX) 20 MG tablet   nitroGLYCERIN (NITROSTAT) 0.4 MG SL tablet   sacubitril-valsartan (ENTRESTO) 24-26 MG   HFrEF (heart failure with reduced ejection fraction) [facility])    Patient currently followed by [facility] cardiology on  Entresto, Lasix as needed and metoprolol.  Continue medication as prescribed follow-up with cardiology as recommended      Relevant Medications   furosemide (LASIX) 20 MG tablet   nitroGLYCERIN (NITROSTAT) 0.4 MG SL tablet   sacubitril-valsartan (ENTRESTO) 24-26 MG     Other   Shortness of breath    Patient to use furosemide as needed refill provided today      Relevant Medications   furosemide (LASIX) 20 MG tablet   Acute cough    Patient has been improving.  He did finish the antibiotics and steroid taper.  Continue to monitor stable at this juncture signs and symptoms reviewed when to be seen again      Other Visit Diagnoses     Elevated brain natriuretic peptide (BNP) level       Relevant Medications   furosemide (LASIX) 20 MG tablet       Return in about 6 months (around 02/11/2024)  for HTN/ CHF/COPD recheck .    Audria Nine, NP

## 2023-08-25 DIAGNOSIS — E119 Type 2 diabetes mellitus without complications: Secondary | ICD-10-CM | POA: Diagnosis not present

## 2023-08-26 ENCOUNTER — Ambulatory Visit: Payer: 59 | Admitting: Pulmonary Disease

## 2023-08-26 ENCOUNTER — Encounter: Payer: Self-pay | Admitting: Pulmonary Disease

## 2023-08-26 VITALS — BP 120/70 | HR 69 | Ht 65.0 in | Wt 175.8 lb

## 2023-08-26 DIAGNOSIS — G4733 Obstructive sleep apnea (adult) (pediatric): Secondary | ICD-10-CM

## 2023-08-26 NOTE — Patient Instructions (Signed)
Referral to ENT Dr. Deboraha Sprang  Evaluation for inspire device -Severe sleep apnea, with nasal stuffiness and congestion, not able to tolerate CPAP  Evaluation for chronic sinusitis  Follow-up in about 3 months  Look for information regarding inspire device -[email or web address]

## 2023-08-26 NOTE — Progress Notes (Signed)
[name]    [id number]    1961-05-05  Primary Care Physician:Cable, Genene Churn, NP  Referring Physician: Eden Emms, NP [address] [place],  [place] [address]  Chief complaint:   Patient with a history of obstructive sleep apnea History of obstructive lung disease  HPI:  Diagnosed with severe obstructive sleep apnea for which he was on CPAP therapy -Has not been able to use CPAP in the last 5 to 6 months because of nasal stuffiness and congestion, does have nasal pillows  Currently uses Flonase to help with nasal stuffiness but this does not seem to be helping Also does use Zyrtec  I did discuss use of allergy medications and addition of Astelin  Importance of treating sleep disordered breathing was discussed  Other options of treatment was discussed including an inspire device  Continues to have significant nasal stuffiness and congestion despite using nasal steroids We did talk about possibility of ENT evaluation  Has a history of obstructive lung disease  He was able to quit smoking for a little while recently for about a month but is back up to smoking about 1 and half packs a day  He was hospitalized in October 2023 for respiratory failure, pneumonia  Has underlying history of atrial fibrillation, diabetes, coronary artery disease  It was prescribed Breztri which he does not remember to use all the time  He was having significant enough difficulty with using the CPAP and felt it was actually keeping him from being able to get enough rest  Outpatient Encounter Medications as of 08/26/2023  Medication Sig   acetaminophen (TYLENOL) 500 MG tablet Take 1,000 mg by mouth every 6 (six) hours as needed for headache.   albuterol (VENTOLIN HFA) 108 (90 Base) MCG/ACT inhaler Inhale 1-2 puffs into the lungs every 6 (six) hours as needed for wheezing or shortness of breath.   aspirin 81 MG chewable tablet Chew 1 tablet (81 mg total) by mouth daily.    atorvastatin (LIPITOR) 80 MG tablet Take 1 tablet (80 mg total) by mouth at bedtime.   azelastine (ASTELIN) 0.1 % nasal spray Place 1 spray into both nostrils 2 (two) times daily. Use in each nostril as directed   Budeson-Glycopyrrol-Formoterol (BREZTRI AEROSPHERE) 160-9-4.8 MCG/ACT AERO Inhale 2 puffs into the lungs in the morning and at bedtime. (Patient taking differently: Inhale 2 puffs into the lungs in the morning and at bedtime. Not taking daily)   cetirizine (ZYRTEC ALLERGY) 10 MG tablet Take 1 tablet (10 mg total) by mouth daily.   citalopram (CELEXA) 20 MG tablet Take 30 mg by mouth daily.   furosemide (LASIX) 20 MG tablet Take 1 tablet (20 mg total) by mouth daily as needed.   gabapentin (NEURONTIN) 300 MG capsule TAKE 2 CAPSULES BY MOUTH AT BEDTIME   glucose blood test strip 1 each by Other route as needed for other. For one touch ultra meter. DX: E11.9   HUMALOG KWIKPEN 100 UNIT/ML KwikPen Inject 8 Units into the skin 3 (three) times daily.   Insulin Pen Needle 32G X 4 MM MISC Use to inject Insulin daily. Dx: E11.9   Insulin Syringe-Needle U-100 (INSULIN SYRINGE .3CC/31GX5/16") 31G X 5/16" 0.3 ML MISC 1 Units by Does not apply route 2 (two) times daily.   Lancets (ONETOUCH ULTRASOFT) lancets Use as instructed   LANTUS SOLOSTAR 100 UNIT/ML Solostar Pen Inject 36 Units into the skin 2 (two) times daily.   metoprolol succinate (  TOPROL-XL) 100 MG 24 hr tablet Take 100 mg by mouth daily.   nitroGLYCERIN (NITROSTAT) 0.4 MG SL tablet Place 1 tablet (0.4 mg total) under the tongue every 5 (five) minutes as needed for chest pain.   rOPINIRole (REQUIP) 0.25 MG tablet Take 0.25 mg by mouth at bedtime.   sacubitril-valsartan (ENTRESTO) 24-26 MG 0.5 tablet daily   sitaGLIPtin (JANUVIA) 100 MG tablet Take 1 tablet by mouth daily.   ezetimibe (ZETIA) 10 MG tablet Take 1 tablet by mouth daily.   [DISCONTINUED] insulin isophane & regular human KwikPen (HUMULIN 70/30 KWIKPEN) (70-30) 100 UNIT/ML  KwikPen Inject 50-60 Units into the skin See admin instructions. Please take 50 units every morning and 60 units with dinner (Patient not taking: Reported on 08/03/2023)   No facility-administered encounter medications on file as of 08/26/2023.    Allergies as of 08/26/2023 - Review Complete 08/26/2023  Allergen Reaction Noted   Latex Rash 06/16/2016    Past Medical History:  Diagnosis Date   Abrasion L FOREARM   Atrial fibrillation (HCC)    a. initially occurring in post-op setting in 05/2016, started on Eliquis b. 08/2016: s/p clipping of atrial appendage at time of CABG --> Eliquis switched to Coumadin   CAD (coronary artery disease)    a. 05/2016: NSTEMI, cath showing 3v disease with CABG recommended once MRSA infection resolved. b. 08/2016: CABG with LIMA-LAD, SVG-D1, and SVG-RI.   Cancer (HCC)    basal cell carcinoma of the skin   Diabetes mellitus without complication (HCC)    Headache(784.0)    Hematuria, microscopic "SINCE I WAS A KID"   Hyperlipidemia    Hypertension    Ischemic cardiomyopathy    a. 05/2016: echo w/ EF of 25-35% and akinesis of the basal-midinferolateral and inferior myocardium.   Lactose intolerance    Myocardial infarction (HCC)    Respiratory failure (HCC)    Rotator cuff tear RIGHT   Tobacco use    a. quit in 05/2016 following MI    Past Surgical History:  Procedure Laterality Date   AMPUTATION TOE Left 02/06/2020   5th ray amputation with graph [facility])   BRONCHIAL BRUSHINGS  08/01/2022   Procedure: BRONCHIAL BRUSHINGS;  Surgeon: Lorin Glass, MD;  Location: [facility] ENDOSCOPY;  Service: Pulmonary;;   BRONCHIAL WASHINGS  08/01/2022   Procedure: BRONCHIAL WASHINGS;  Surgeon: Lorin Glass, MD;  Location: [facility] ENDOSCOPY;  Service: Pulmonary;;   CARDIAC CATHETERIZATION N/A 06/16/2016   Procedure: Left Heart Cath and Coronary Angiography;  Surgeon: Lennette Bihari, MD;  Location: [facility] INVASIVE CV LAB;  Service: Cardiovascular;  Laterality: N/A;    CLIPPING OF ATRIAL APPENDAGE N/A 08/24/2016   Procedure: CLIPPING OF ATRIAL APPENDAGE;  Surgeon: Kerin Perna, MD;  Location: [facility] OR;  Service: Open Heart Surgery;  Laterality: N/A;   COLONOSCOPY WITH PROPOFOL N/A 02/12/2022   Procedure: COLONOSCOPY WITH PROPOFOL;  Surgeon: Midge Minium, MD;  Location: [facility] ENDOSCOPY;  Service: Endoscopy;  Laterality: N/A;   CORONARY ARTERY BYPASS GRAFT N/A 08/24/2016   Procedure: CORONARY ARTERY BYPASS GRAFTING (CABG) x three,  using left internal mammary artery and right leg greater saphenous vein harvested endoscopically;  Surgeon: Kerin Perna, MD;  Location: [facility] OR;  Service: Open Heart Surgery;  Laterality: N/A;   HERNIA REPAIR  X2   INCISION AND DRAINAGE ABSCESS Left 06/10/2016   Procedure: INCISION AND DRAINAGE LEFT AXILLARY ABSCESS;  Surgeon: Chevis Pretty III, MD;  Location: [facility] ORS;  Service: General;  Laterality: Left;   ROTATOR  CUFF REPAIR Bilateral    TEE WITHOUT CARDIOVERSION N/A 08/24/2016   Procedure: TRANSESOPHAGEAL ECHOCARDIOGRAM (TEE);  Surgeon: Kerin Perna, MD;  Location: [facility] OR;  Service: Open Heart Surgery;  Laterality: N/A;   VIDEO BRONCHOSCOPY Right 08/01/2022   Procedure: VIDEO BRONCHOSCOPY WITH FLUORO;  Surgeon: Lorin Glass, MD;  Location: [facility] ENDOSCOPY;  Service: Pulmonary;  Laterality: Right;    Family History  Problem Relation Age of Onset   Hypertension Mother    Breast cancer Mother    Hypertension Father    Sudden death Father 7       at age of death   Diabetes Father    Heart attack Father    Heart attack Sister    Cancer Maternal Aunt        breast x2 aunts    Social History   Socioeconomic History   Marital status: Married    Spouse name: Not on file   Number of children: Not on file   Years of education: Not on file   Highest education level: Not on file  Occupational History   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  Smoking a pack per day.  Trying to cut back.  01/12/2023 hfb  Substance and Sexual Activity   Alcohol use: No   Drug use: No   Sexual activity: Not on file  Other Topics Concern   Not on file  Social History Narrative   Not on file   Social Determinants of Health   Financial Resource Strain: Low Risk  (08/10/2022)   Overall Financial Resource Strain (CARDIA)    Difficulty of Paying Living Expenses: Not hard at all  Food Insecurity: No Food Insecurity (08/10/2022)   Hunger Vital Sign    Worried About Running Out of Food in the Last Year: Never true    Ran Out of Food in the Last Year: Never true  Transportation Needs: No Transportation Needs (08/10/2022)   PRAPARE - Administrator, Civil Service (Medical): No    Lack of Transportation (Non-Medical): No  Physical Activity: Insufficiently Active (08/10/2022)   Exercise Vital Sign    Days of Exercise per Week: 2 days    Minutes of Exercise per Session: 20 min  Stress: No Stress Concern Present (08/10/2022)   [employer] of Occupational Health - Occupational Stress Questionnaire    Feeling of Stress : Only a little  Social Connections: Socially Isolated (08/10/2022)   Social Connection and Isolation Panel [NHANES]    Frequency of Communication with Friends and Family: More than three times a week    Frequency of Social Gatherings with Friends and Family: More than three times a week    Attends Religious Services: Never    Database administrator or Organizations: No    Attends Banker Meetings: Never    Marital Status: Separated  Intimate Partner Violence: Not At Risk (08/10/2022)   Humiliation, Afraid, Rape, and Kick questionnaire    Fear of Current or Ex-Partner: No    Emotionally Abused: No    Physically Abused: No    Sexually Abused: No     Review of Systems  Respiratory:  Positive for apnea and shortness  of breath.   Psychiatric/Behavioral:  Positive for sleep disturbance.     Vitals:   08/26/23 0958  BP: 120/70  Pulse: 69  SpO2: 97%     Physical Exam Constitutional:      Appearance: Normal appearance.  HENT:     Head: Normocephalic.     Mouth/Throat:     Mouth: Mucous membranes are moist.  Eyes:     General: No scleral icterus. Cardiovascular:     Rate and Rhythm: Normal rate and regular rhythm.     Heart sounds: No murmur heard.    No friction rub.  Pulmonary:     Effort: No respiratory distress.     Breath sounds: No stridor. No wheezing or rhonchi.  Musculoskeletal:     Cervical back: No rigidity or tenderness.  Neurological:     Mental Status: He is alert.  Psychiatric:        Mood and Affect: Mood normal.    Data Reviewed: Pulmonary function test reveals moderate obstruction with significant bronchodilator response  Sleep study 01/17/2021 with AHI of 56, O2 nadir of 42%  Compliance data-has not been using it much  Assessment:  Severe obstructive sleep apnea -Not currently able to tolerate CPAP secondary to persistent nasal stuffiness and congestion  Moderate obstructive lung disease -Continues to tolerate Breztri  Active smoker -Working on quitting  Smoking cessation counseling  Nasal stuffiness and congestion prevented him from being able to use his CPAP at present -Zyrtec 10 mg nightly -Astepro 2 sprays each nostril daily in addition to Flonase  Plan/Recommendations:  Encouraged to try to get back to using CPAP  Referral to ENT Dr. Deboraha Sprang his chronic nasal stuffiness and inspire device evaluation  Continue Breztri  Follow-up in about 3 months  Encouraged to call with significant concerns  Encouraged to look up more information about hypoglossal nerve stimulator   Virl Diamond MD [facility] 08/26/2023, 9:27 PM  CC: Eden Emms,  NP

## 2023-08-30 DIAGNOSIS — Z1159 Encounter for screening for other viral diseases: Secondary | ICD-10-CM | POA: Diagnosis not present

## 2023-08-30 DIAGNOSIS — I5022 Chronic systolic (congestive) heart failure: Secondary | ICD-10-CM | POA: Diagnosis not present

## 2023-08-30 DIAGNOSIS — E785 Hyperlipidemia, unspecified: Secondary | ICD-10-CM | POA: Diagnosis not present

## 2023-08-30 DIAGNOSIS — F17200 Nicotine dependence, unspecified, uncomplicated: Secondary | ICD-10-CM | POA: Diagnosis not present

## 2023-08-30 DIAGNOSIS — N189 Chronic kidney disease, unspecified: Secondary | ICD-10-CM | POA: Diagnosis not present

## 2023-08-30 DIAGNOSIS — I5020 Unspecified systolic (congestive) heart failure: Secondary | ICD-10-CM | POA: Diagnosis not present

## 2023-08-30 DIAGNOSIS — E1100 Type 2 diabetes mellitus with hyperosmolarity without nonketotic hyperglycemic-hyperosmolar coma (NKHHC): Secondary | ICD-10-CM | POA: Diagnosis not present

## 2023-08-30 DIAGNOSIS — G2581 Restless legs syndrome: Secondary | ICD-10-CM | POA: Diagnosis not present

## 2023-08-30 DIAGNOSIS — I2510 Atherosclerotic heart disease of native coronary artery without angina pectoris: Secondary | ICD-10-CM | POA: Diagnosis not present

## 2023-08-30 LAB — HEMOGLOBIN A1C: Hemoglobin A1C: 7.3

## 2023-09-03 DIAGNOSIS — E113511 Type 2 diabetes mellitus with proliferative diabetic retinopathy with macular edema, right eye: Secondary | ICD-10-CM | POA: Diagnosis not present

## 2023-09-11 DIAGNOSIS — M7121 Synovial cyst of popliteal space [Baker], right knee: Secondary | ICD-10-CM | POA: Diagnosis not present

## 2023-09-11 DIAGNOSIS — M1711 Unilateral primary osteoarthritis, right knee: Secondary | ICD-10-CM | POA: Diagnosis not present

## 2023-09-24 DIAGNOSIS — E119 Type 2 diabetes mellitus without complications: Secondary | ICD-10-CM | POA: Diagnosis not present

## 2023-09-27 DIAGNOSIS — M5032 Other cervical disc degeneration, mid-cervical region, unspecified level: Secondary | ICD-10-CM | POA: Diagnosis not present

## 2023-09-27 DIAGNOSIS — R2689 Other abnormalities of gait and mobility: Secondary | ICD-10-CM | POA: Diagnosis not present

## 2023-09-27 DIAGNOSIS — G250 Essential tremor: Secondary | ICD-10-CM | POA: Diagnosis not present

## 2023-09-27 DIAGNOSIS — M4802 Spinal stenosis, cervical region: Secondary | ICD-10-CM | POA: Diagnosis not present

## 2023-09-27 DIAGNOSIS — M79602 Pain in left arm: Secondary | ICD-10-CM | POA: Diagnosis not present

## 2023-09-27 DIAGNOSIS — G2581 Restless legs syndrome: Secondary | ICD-10-CM | POA: Diagnosis not present

## 2023-09-27 DIAGNOSIS — M79601 Pain in right arm: Secondary | ICD-10-CM | POA: Diagnosis not present

## 2023-09-27 DIAGNOSIS — R202 Paresthesia of skin: Secondary | ICD-10-CM | POA: Diagnosis not present

## 2023-10-01 ENCOUNTER — Ambulatory Visit (INDEPENDENT_AMBULATORY_CARE_PROVIDER_SITE_OTHER): Payer: 59 | Admitting: Nurse Practitioner

## 2023-10-01 ENCOUNTER — Encounter: Payer: Self-pay | Admitting: Nurse Practitioner

## 2023-10-01 VITALS — BP 116/78 | HR 79 | Temp 97.6°F | Ht 65.0 in | Wt 172.4 lb

## 2023-10-01 DIAGNOSIS — R8290 Unspecified abnormal findings in urine: Secondary | ICD-10-CM | POA: Diagnosis not present

## 2023-10-01 DIAGNOSIS — R197 Diarrhea, unspecified: Secondary | ICD-10-CM

## 2023-10-01 DIAGNOSIS — R112 Nausea with vomiting, unspecified: Secondary | ICD-10-CM | POA: Insufficient documentation

## 2023-10-01 DIAGNOSIS — R1010 Upper abdominal pain, unspecified: Secondary | ICD-10-CM | POA: Diagnosis not present

## 2023-10-01 LAB — COMPREHENSIVE METABOLIC PANEL
ALT: 23 U/L (ref 0–53)
AST: 25 U/L (ref 0–37)
Albumin: 4.2 g/dL (ref 3.5–5.2)
Alkaline Phosphatase: 124 U/L — ABNORMAL HIGH (ref 39–117)
BUN: 29 mg/dL — ABNORMAL HIGH (ref 6–23)
CO2: 25 meq/L (ref 19–32)
Calcium: 8.6 mg/dL (ref 8.4–10.5)
Chloride: 110 meq/L (ref 96–112)
Creatinine, Ser: 1.63 mg/dL — ABNORMAL HIGH (ref 0.40–1.50)
GFR: 44.94 mL/min — ABNORMAL LOW (ref >60.00)
Glucose, Bld: 161 mg/dL — ABNORMAL HIGH (ref 70–99)
Potassium: 4.9 meq/L (ref 3.5–5.1)
Sodium: 144 meq/L (ref 135–145)
Total Bilirubin: 0.6 mg/dL (ref 0.2–1.2)
Total Protein: 6.8 g/dL (ref 6.0–8.3)

## 2023-10-01 LAB — POCT URINALYSIS DIPSTICK
Bilirubin, UA: NEGATIVE
Blood, UA: POSITIVE
Glucose, UA: NEGATIVE
Ketones, UA: NEGATIVE
Leukocytes, UA: NEGATIVE
Nitrite, UA: NEGATIVE
Protein, UA: POSITIVE — AB
Spec Grav, UA: >=1.03 — AB (ref 1.010–1.025)
Urobilinogen, UA: 0.2 U/dL
pH, UA: 5 (ref 5.0–8.0)

## 2023-10-01 LAB — CBC
HCT: 51.4 % (ref 39.0–52.0)
Hemoglobin: 16.7 g/dL (ref 13.0–17.0)
MCHC: 32.4 g/dL (ref 30.0–36.0)
MCV: 88.4 fL (ref 78.0–100.0)
Platelets: 102 10*3/uL — ABNORMAL LOW (ref 150.0–400.0)
RBC: 5.82 Mil/uL — ABNORMAL HIGH (ref 4.22–5.81)
RDW: 15.8 % — ABNORMAL HIGH (ref 11.5–15.5)
WBC: 9.6 10*3/uL (ref 4.0–10.5)

## 2023-10-01 LAB — LIPASE: Lipase: 87 U/L — ABNORMAL HIGH (ref 11.0–59.0)

## 2023-10-01 MED ORDER — PANTOPRAZOLE SODIUM 20 MG PO TBEC: 20.0000 mg | DELAYED_RELEASE_TABLET | Freq: Every day | ORAL | 0 refills | Status: DC

## 2023-10-01 NOTE — Assessment & Plan Note (Signed)
Will start patient on Protonix 20 mg daily.  This could be secondary to gastroparesis or uncontrolled GERD.  Patient continues to have the vomiting and nausea will consider using metoclopramide 5 mg

## 2023-10-01 NOTE — Assessment & Plan Note (Signed)
Pending urine culture given abdominal pain

## 2023-10-01 NOTE — Assessment & Plan Note (Signed)
Unsure of etiology presumed infectious.  Patient has been treated with antibiotics approximate 6 weeks ago pending PCR for gastrointestinal pathogens along with C. difficile.  Check basic labs to make sure patient has not deranged electrolytes are caused a acute kidney injury with volume depletion.

## 2023-10-01 NOTE — Progress Notes (Signed)
Acute Office Visit  Subjective:     Patient ID: [name], male    DOB: 1961/03/22, 62 y.o.   MRN: [id number]  Chief Complaint  Patient presents with   Emesis   Diarrhea    Pt complains diarrhea and vomiting started two weeks ago.  Pt complains of having no energy. Little water intake. Body temp fluctuates.      Patient is in today for diarrhea history of CHF, CAD, hypertension, paroxysmal atrial fibrillation, chronic respiratory failure, COPD, OSA, DM type II, tobacco use.  Symptoms started approx 2 weeks ago. State that he has had bad diarrhea, thired, weakness, some vomiting it for once a day. States that last antibotic was 08/13/2023. Does have a history of gastro paresis. No sick contacts No one else has the same symptoms  Patient has been using Imodium over-the-counter with mild relief. Patient is drinking plenty of fluids and eating some but not has normal appetite level.  Review of Systems  Constitutional:  Positive for chills and malaise/fatigue. Negative for fever.  HENT:  Negative for ear pain and sore throat.   Respiratory:  Positive for cough (baseline). Negative for shortness of breath.   Gastrointestinal:  Positive for abdominal pain, diarrhea, nausea and vomiting. Negative for blood in stool.  Musculoskeletal:  Positive for joint pain.  Neurological:  Negative for headaches.        Objective:    BP 116/78   Pulse 79   Temp 97.6 F (36.4 C) (Oral)   Ht 5\' 5"  (1.651 m)   Wt 172 lb 6.4 oz (78.2 kg)   SpO2 98%   BMI 28.69 kg/m    Physical Exam Vitals and nursing note reviewed.  Constitutional:      Appearance: Normal appearance.  HENT:     Right Ear: Tympanic membrane, ear canal and external ear normal.     Left Ear: Tympanic membrane, ear canal and external ear normal.     Mouth/Throat:     Mouth: Mucous membranes are moist.     Pharynx: Oropharynx is clear.  Cardiovascular:     Rate and Rhythm: Normal rate and regular rhythm.     Heart  sounds: Normal heart sounds.  Pulmonary:     Effort: Pulmonary effort is normal.     Breath sounds: Normal breath sounds.  Abdominal:     General: Bowel sounds are normal. There is no distension.     Palpations: There is no mass.     Tenderness: There is abdominal tenderness.     Hernia: No hernia is present.  Lymphadenopathy:     Cervical: No cervical adenopathy.  Neurological:     Mental Status: He is alert.     Results for orders placed or performed in visit on 10/01/23  POCT urinalysis dipstick  Result Value Ref Range   Color, UA Yellow    Clarity, UA clear    Glucose, UA Negative Negative   Bilirubin, UA neg    Ketones, UA neg    Spec Grav, UA >=1.030 (A) 1.010 - 1.025   Blood, UA pos    pH, UA 5.0 5.0 - 8.0   Protein, UA Positive (A) Negative   Urobilinogen, UA 0.2 0.2 or 1.0 E.U./dL   Nitrite, UA neg    Leukocytes, UA Negative Negative   Appearance     Odor          Assessment & Plan:   Problem List Items Addressed This Visit  Digestive   Nausea and vomiting   Will start patient on Protonix 20 mg daily.  This could be secondary to gastroparesis or uncontrolled GERD.  Patient continues to have the vomiting and nausea will consider using metoclopramide 5 mg      Relevant Medications   pantoprazole (PROTONIX) 20 MG tablet   Other Relevant Orders   CBC   Comprehensive metabolic panel     Other   Diarrhea   Unsure of etiology presumed infectious.  Patient has been treated with antibiotics approximate 6 weeks ago pending PCR for gastrointestinal pathogens along with C. difficile.  Check basic labs to make sure patient has not deranged electrolytes are caused a acute kidney injury with volume depletion.      Relevant Orders   CBC   Comprehensive metabolic panel   Gastrointestinal Pathogen Pnl RT, PCR   C. difficile GDH and Toxin A/B   Upper abdominal pain - Primary   Pending basic labs inclusive of CBC, CMP, lipase.  Start Protonix 20 mg daily.   Drink plenty of fluids incorporate brat diet.      Relevant Medications   pantoprazole (PROTONIX) 20 MG tablet   Other Relevant Orders   CBC   Comprehensive metabolic panel   Lipase   POCT urinalysis dipstick (Completed)   Abnormal urinalysis   Pending urine culture given abdominal pain      Relevant Orders   Urine Culture    Meds ordered this encounter  Medications   pantoprazole (PROTONIX) 20 MG tablet    Sig: Take 1 tablet (20 mg total) by mouth daily.    Dispense:  30 tablet    Refill:  0    Supervising Provider:   Roxy Manns A [[id number]]    Return if symptoms worsen or fail to improve, for As scheduled .  Audria Nine, NP

## 2023-10-01 NOTE — Patient Instructions (Addendum)
Nice to you today I will be in touch with the labs once I have them Follow up if you do not improve

## 2023-10-01 NOTE — Assessment & Plan Note (Signed)
Pending basic labs inclusive of CBC, CMP, lipase.  Start Protonix 20 mg daily.  Drink plenty of fluids incorporate brat diet.

## 2023-10-02 LAB — URINE CULTURE
MICRO NUMBER:: 15850066
Result:: NO GROWTH
SPECIMEN QUALITY:: ADEQUATE

## 2023-10-04 ENCOUNTER — Telehealth: Payer: Self-pay | Admitting: Nurse Practitioner

## 2023-10-04 ENCOUNTER — Other Ambulatory Visit: Payer: Self-pay | Admitting: Nurse Practitioner

## 2023-10-04 ENCOUNTER — Encounter: Payer: Self-pay | Admitting: Nurse Practitioner

## 2023-10-04 NOTE — Telephone Encounter (Signed)
Pt stated he is not sure when he will be getting colonoscopy. Did drop of IFOB today said diarrhea stopped after visit on Friday and started back up again this morning. Will come by tomorrow or some time this week to get the correct stool kit.

## 2023-10-07 ENCOUNTER — Encounter: Payer: Self-pay | Admitting: Nurse Practitioner

## 2023-10-14 DIAGNOSIS — E119 Type 2 diabetes mellitus without complications: Secondary | ICD-10-CM | POA: Diagnosis not present

## 2023-10-14 DIAGNOSIS — M7121 Synovial cyst of popliteal space [Baker], right knee: Secondary | ICD-10-CM | POA: Diagnosis not present

## 2023-10-24 DIAGNOSIS — E119 Type 2 diabetes mellitus without complications: Secondary | ICD-10-CM | POA: Diagnosis not present

## 2023-10-26 DIAGNOSIS — M79601 Pain in right arm: Secondary | ICD-10-CM | POA: Diagnosis not present

## 2023-10-26 DIAGNOSIS — R202 Paresthesia of skin: Secondary | ICD-10-CM | POA: Diagnosis not present

## 2023-10-26 DIAGNOSIS — M79602 Pain in left arm: Secondary | ICD-10-CM | POA: Diagnosis not present

## 2023-10-27 DIAGNOSIS — E113511 Type 2 diabetes mellitus with proliferative diabetic retinopathy with macular edema, right eye: Secondary | ICD-10-CM | POA: Diagnosis not present

## 2023-11-02 ENCOUNTER — Emergency Department: Payer: 59

## 2023-11-02 ENCOUNTER — Emergency Department
Admission: EM | Admit: 2023-11-02 | Discharge: 2023-11-02 | Disposition: A | Discharge status: Home / Self Care | Payer: 59 | Attending: Student in an Organized Health Care Education/Training Program | Admitting: Student in an Organized Health Care Education/Training Program

## 2023-11-02 ENCOUNTER — Other Ambulatory Visit: Payer: Self-pay

## 2023-11-02 DIAGNOSIS — M79622 Pain in left upper arm: Secondary | ICD-10-CM | POA: Diagnosis not present

## 2023-11-02 DIAGNOSIS — S46212A Strain of muscle, fascia and tendon of other parts of biceps, left arm, initial encounter: Secondary | ICD-10-CM | POA: Diagnosis not present

## 2023-11-02 DIAGNOSIS — Y92019 Unspecified place in single-family (private) house as the place of occurrence of the external cause: Secondary | ICD-10-CM | POA: Diagnosis not present

## 2023-11-02 DIAGNOSIS — E119 Type 2 diabetes mellitus without complications: Secondary | ICD-10-CM | POA: Diagnosis not present

## 2023-11-02 DIAGNOSIS — I110 Hypertensive heart disease with heart failure: Secondary | ICD-10-CM | POA: Diagnosis not present

## 2023-11-02 DIAGNOSIS — W000XXA Fall on same level due to ice and snow, initial encounter: Secondary | ICD-10-CM | POA: Insufficient documentation

## 2023-11-02 DIAGNOSIS — W19XXXA Unspecified fall, initial encounter: Secondary | ICD-10-CM

## 2023-11-02 DIAGNOSIS — I509 Heart failure, unspecified: Secondary | ICD-10-CM | POA: Insufficient documentation

## 2023-11-02 DIAGNOSIS — S4992XA Unspecified injury of left shoulder and upper arm, initial encounter: Secondary | ICD-10-CM | POA: Diagnosis present

## 2023-11-02 HISTORY — DX: Heart failure, unspecified: I50.9

## 2023-11-02 MED ORDER — CYCLOBENZAPRINE HCL 10 MG PO TABS
5.0000 mg | ORAL_TABLET | Freq: Once | ORAL | Status: AC
  Administered 2023-11-02: 5 mg via ORAL
  Filled 2023-11-02: qty 1

## 2023-11-02 MED ORDER — HYDROCODONE-ACETAMINOPHEN 5-325 MG PO TABS: 1.0000 | ORAL_TABLET | Freq: Three times a day (TID) | ORAL | 0 refills | Status: AC | PRN

## 2023-11-02 NOTE — Discharge Instructions (Addendum)
 Your exam and x-ray overall reassuring.  No radiologic evidence of any fracture or dislocation.  You do have clinical evidence of a biceps tendon rupture.  You be placed in a sling for comfort.  You may take the prescription pain medicine or muscle relaxant as directed.  Apply ice packs to help reduce pain and swelling.  Follow-up with Dr. Edie or your Ortho provider for further evaluation.

## 2023-11-02 NOTE — ED Provider Triage Note (Signed)
 Emergency Medicine Provider Triage Evaluation Note  SUSANA GRIPP , a 63 y.o. male  was evaluated in triage.  Pt complains of fall in the snow, with  left arm pain, and abrasion on his forehead.  Patient denies loss of consciousness patient is not taking blood thinners.  Review of Systems  Positive:  Negative:   Physical Exam  BP 127/72   Pulse 76   Temp 98 F (36.7 C)   Resp 17   Ht 5' 5 (1.651 m)   Wt 77.1 kg   SpO2 95%   BMI 28.29 kg/m  Gen:   Awake, no distress   Resp:  Normal effort  MSK:   Moves extremities without difficulty  Left arm: Popeye sign positive.  Other:  Face: Abrasion of this skin in the glabellar area.  Medical Decision Making  Medically screening exam initiated at 5:53 PM.  Appropriate orders placed.  JLYNN LANGILLE was informed that the remainder of the evaluation will be completed by another provider, this initial triage assessment does not replace that evaluation, and the importance of remaining in the ED until their evaluation is complete.  Patient with lesion in the biceps tendon ordered at left humerus x-ray   Janit Kast, PA-C 11/02/23 1755

## 2023-11-02 NOTE — ED Triage Notes (Addendum)
 Pt to ED for fall, slipped on ice today. Hit front of head, denies LOC, no blood thinner. Reports pain to left bicep. Swelling noted to left bicep

## 2023-11-02 NOTE — ED Provider Notes (Signed)
 [facility] Emergency Department Provider Note     Event Date/Time   First MD Initiated Contact with Patient 11/02/23 2039     (approximate)   History   Fall   HPI  [name] is a 63 y.o. male with a history of diabetes, HTN, A-fib, HLD, CHF, and MI, presents to the ED for evaluation of injury sustained following a mechanical fall.  Patient slipped on ice today, landing on his outstretched hands and hitting the forehead.  He denies any LOC or blood thinner.  He does endorse immediate pain and disability to the left bicep.  He presents with swelling and deformity to the left upper arm at the biceps muscle.  No chest pain or shortness of breath reported.  Physical Exam   Triage Vital Signs: ED Triage Vitals  Encounter Vitals Group     BP 11/02/23 1750 127/72     Systolic BP Percentile --      Diastolic BP Percentile --      Pulse Rate 11/02/23 1750 76     Resp 11/02/23 1750 17     Temp 11/02/23 1750 98 F (36.7 C)     Temp src --      SpO2 11/02/23 1750 95 %     Weight 11/02/23 1752 170 lb (77.1 kg)     Height 11/02/23 1752 5' 5 (1.651 m)     Head Circumference --      Peak Flow --      Pain Score 11/02/23 1751 5     Pain Loc --      Pain Education --      Exclude from Growth Chart --     Most recent vital signs: Vitals:   11/02/23 1750  BP: 127/72  Pulse: 76  Resp: 17  Temp: 98 F (36.7 C)  SpO2: 95%    General Awake, no distress. NAD HEENT NCAT, except for an abrasion/laceration to the central forehead . PERRL. EOMI. No rhinorrhea. Mucous membranes are moist.  CV:  Good peripheral perfusion. RRR RESP:  Normal effort. CTA ABD:  No distention.  MSK:  Left forearm with obvious muscle deformity to the biceps belly.  The muscle appears to the proximal forearm.  Patient with decreased supination of the forearm on the left.  Normal composite fist distally. NEURO: Cranial nerves II to XII grossly intact.   ED Results / Procedures  / Treatments   Labs (all labs ordered are listed, but only abnormal results are displayed) Labs Reviewed - No data to display   EKG   RADIOLOGY  I personally viewed and evaluated these images as part of my medical decision making, as well as reviewing the written report by the radiologist.  ED Provider Interpretation: No acute bony injury  DG Humerus Left Result Date: 11/02/2023 CLINICAL DATA:  Injury, pain. EXAM: LEFT HUMERUS - 2+ VIEW COMPARISON:  None Available. FINDINGS: There is no evidence of fracture or other focal bone lesions. No cortical margins of the humerus are intact. Shoulder and elbow alignment are maintained. Soft tissues are unremarkable. IMPRESSION: No fracture of the humerus. Electronically Signed   By: Andrea Gasman M.D.   On: 11/02/2023 18:43     PROCEDURES:  Critical Care performed: No  Procedures   MEDICATIONS ORDERED IN ED: Medications  cyclobenzaprine  (FLEXERIL ) tablet 5 mg (5 mg Oral Given 11/02/23 2149)     IMPRESSION / MDM / ASSESSMENT AND PLAN / ED COURSE  I reviewed the  triage vital signs and the nursing notes.                              Differential diagnosis includes, but is not limited to, humeral fracture, shoulder contusion, shoulder sprain, shoulder fracture, shoulder dislocation muscle tear, tendinitis facial abrasion, contusion  Patient's presentation is most consistent with acute complicated illness / injury requiring diagnostic workup.  Patient's diagnosis is consistent with acute left biceps tendon rupture, and facial abrasion/laceration.  Patient was offered but declined any wound repair to the small superficial laceration to the central forehead between the brows.  Patient will be discharged home with prescriptions for cyclobenzaprine  and hydrocodone .  Patient is placed in an arm sling for support.  Patient is to follow up with orthopedics as discussed, as needed or otherwise directed. Patient is given ED precautions to return  to the ED for any worsening or new symptoms.  FINAL CLINICAL IMPRESSION(S) / ED DIAGNOSES   Final diagnoses:  Fall in home, initial encounter  Biceps tendon rupture, left, initial encounter     Rx / DC Orders   ED Discharge Orders          Ordered    HYDROcodone -acetaminophen  (NORCO) 5-325 MG tablet  3 times daily PRN        11/02/23 2147             Note:  This document was prepared using Dragon voice recognition software and may include unintentional dictation errors.    Loyd Candida LULLA Aldona, PA-C 11/02/23 2336    Dorothyann Drivers, MD 11/03/23 [phone]

## 2023-11-11 DIAGNOSIS — S46212A Strain of muscle, fascia and tendon of other parts of biceps, left arm, initial encounter: Secondary | ICD-10-CM | POA: Diagnosis not present

## 2023-11-11 DIAGNOSIS — G5602 Carpal tunnel syndrome, left upper limb: Secondary | ICD-10-CM | POA: Diagnosis not present

## 2023-11-11 DIAGNOSIS — E1142 Type 2 diabetes mellitus with diabetic polyneuropathy: Secondary | ICD-10-CM | POA: Diagnosis not present

## 2023-11-11 DIAGNOSIS — M75122 Complete rotator cuff tear or rupture of left shoulder, not specified as traumatic: Secondary | ICD-10-CM | POA: Diagnosis not present

## 2023-11-12 DIAGNOSIS — E113412 Type 2 diabetes mellitus with severe nonproliferative diabetic retinopathy with macular edema, left eye: Secondary | ICD-10-CM | POA: Diagnosis not present

## 2023-11-16 ENCOUNTER — Encounter: Payer: Self-pay | Admitting: Nurse Practitioner

## 2023-11-17 ENCOUNTER — Ambulatory Visit (INDEPENDENT_AMBULATORY_CARE_PROVIDER_SITE_OTHER): Payer: 59 | Admitting: Podiatry

## 2023-11-17 ENCOUNTER — Encounter: Payer: Self-pay | Admitting: Podiatry

## 2023-11-17 VITALS — Ht 65.0 in | Wt 170.0 lb

## 2023-11-17 DIAGNOSIS — E1142 Type 2 diabetes mellitus with diabetic polyneuropathy: Secondary | ICD-10-CM

## 2023-11-17 DIAGNOSIS — M79676 Pain in unspecified toe(s): Secondary | ICD-10-CM | POA: Diagnosis not present

## 2023-11-17 DIAGNOSIS — L84 Corns and callosities: Secondary | ICD-10-CM

## 2023-11-17 DIAGNOSIS — B351 Tinea unguium: Secondary | ICD-10-CM

## 2023-11-17 DIAGNOSIS — M79609 Pain in unspecified limb: Secondary | ICD-10-CM

## 2023-11-17 NOTE — Progress Notes (Signed)
  Subjective:  Patient ID: [name], male    DOB: 1961-04-13,  MRN: [id number]  Chief Complaint  Patient presents with   Nail Problem    DFC    63 y.o. male presents with the above complaint. History confirmed with patient.  He returns for follow-up.  A1c has increased due to fluctuations with his gastroparesis. Nails painful thick and elongated, calluses built up again as well mainly on the right foot now.   Objective:  Physical Exam: warm, good capillary refill, no trophic changes or ulcerative lesions, normal DP and PT pulses, and he has an abnormal sensory exam with loss of protective sensation. Left Foot: dystrophic yellowed discolored nail plates with subungual debris Right Foot: dystrophic yellowed discolored nail plates with subungual debris and plantar fifth metatarsal there is a painful hyperkeratosis  Radiographs of both feet taken today show no osteomyelitis of the areas of concern of the fifth metatarsal on the right foot or left fourth toe  Assessment:   1. Pain due to onychomycosis of nail   2. Type 2 diabetes mellitus with peripheral neuropathy (HCC)   3. Callus       Plan:  Patient was evaluated and treated and all questions answered.  We discussed if his hammertoes become painful or threatened ulceration that digital deformity Exogen may be indicated to prevent his risk of wound formation on the remaining toes in the left foot.  Discussed the etiology and treatment options for the condition in detail with the patient. Prior debridements have been helpful. Recommended debridement of the nails today. Sharp and mechanical debridement performed of all painful and mycotic nails today. Nails debrided in length and thickness using a nail nipper to level of comfort. Discussed treatment options including appropriate shoe gear. Follow up as needed for painful nails.  All symptomatic hyperkeratoses were safely debrided with a sterile #15 blade to patient's level of  comfort without incident. We discussed preventative and palliative care of these lesions including supportive and accommodative shoegear, padding, prefabricated and custom molded accommodative orthoses, use of a pumice stone and lotions/creams daily.   Return in about 3 months (around 02/15/2024) for at risk diabetic foot care.

## 2023-11-23 DIAGNOSIS — G4733 Obstructive sleep apnea (adult) (pediatric): Secondary | ICD-10-CM | POA: Diagnosis not present

## 2023-11-25 DIAGNOSIS — E119 Type 2 diabetes mellitus without complications: Secondary | ICD-10-CM | POA: Diagnosis not present

## 2023-11-29 ENCOUNTER — Ambulatory Visit (INDEPENDENT_AMBULATORY_CARE_PROVIDER_SITE_OTHER): Payer: 59 | Admitting: Pulmonary Disease

## 2023-11-29 ENCOUNTER — Encounter: Payer: Self-pay | Admitting: Pulmonary Disease

## 2023-11-29 VITALS — BP 155/87 | HR 76 | Ht 65.0 in | Wt 175.2 lb

## 2023-11-29 DIAGNOSIS — J9611 Chronic respiratory failure with hypoxia: Secondary | ICD-10-CM

## 2023-11-29 DIAGNOSIS — R0981 Nasal congestion: Secondary | ICD-10-CM

## 2023-11-29 DIAGNOSIS — M75122 Complete rotator cuff tear or rupture of left shoulder, not specified as traumatic: Secondary | ICD-10-CM | POA: Diagnosis not present

## 2023-11-29 DIAGNOSIS — J441 Chronic obstructive pulmonary disease with (acute) exacerbation: Secondary | ICD-10-CM | POA: Diagnosis not present

## 2023-11-29 NOTE — Patient Instructions (Signed)
 ENT referral for chronic nasal stuffiness/congestion Obstructive sleep apnea-intolerance to CPAP  Continue to work with your neurologist to optimize medications for your limb movement  Follow-up in 3 months  Call us  with significant concerns  Continue your inhaler-Breztri 

## 2023-11-29 NOTE — Progress Notes (Signed)
 [name]    [id number]    October 27, 1960  Primary Care Physician:Cable, Hoy Mackintosh, NP  Referring Physician: Dorothe Gaster, NP [address] [place],  [place] [address]  Chief complaint:   Patient with a history of obstructive sleep apnea History of obstructive lung disease  HPI:  Diagnosed with severe obstructive sleep apnea for which he was on CPAP therapy -Has not been able to tolerate CPAP because of chronic nasal stuffiness and congestion -The last visit, was referred to Dr. Soldotova, but this has not happened yet -Will ensure that he has his referral placed  Has not been keeping a strict sleep routine -Encouraged to work on this  He feels his neuropathy affects his sleep much more than anything else, will follow-up with neurology  Currently uses Flonase to help with nasal stuffiness but this does not seem to be helping Also does use Zyrtec   Discussed the treatment of sleep apnea, although treatment of his neuropathy needs to be optimized as well  Other options of treatment was discussed including an inspire device  Continues to have significant nasal stuffiness and congestion despite using nasal steroids We did talk about possibility of ENT evaluation  Has a history of obstructive lung disease  He was able to quit smoking for a little while recently for about a month but is back up to smoking about 1 and half packs a day  He was hospitalized in October 2023 for respiratory failure, pneumonia  Has underlying history of atrial fibrillation, diabetes, coronary artery disease  It was prescribed Breztri  which he does not remember to use all the time  He was having significant enough difficulty with using the CPAP and felt it was actually keeping him from being able to get enough rest  Outpatient Encounter Medications as of 11/29/2023  Medication Sig   acetaminophen  (TYLENOL ) 500 MG tablet Take 1,000 mg by mouth every 6 (six) hours as needed for  headache.   albuterol  (VENTOLIN  HFA) 108 (90 Base) MCG/ACT inhaler Inhale 1-2 puffs into the lungs every 6 (six) hours as needed for wheezing or shortness of breath.   aspirin  81 MG chewable tablet Chew 1 tablet (81 mg total) by mouth daily.   atorvastatin  (LIPITOR ) 80 MG tablet Take 1 tablet (80 mg total) by mouth at bedtime.   azelastine  (ASTELIN ) 0.1 % nasal spray Place 1 spray into both nostrils 2 (two) times daily. Use in each nostril as directed   cetirizine  (ZYRTEC  ALLERGY) 10 MG tablet Take 1 tablet (10 mg total) by mouth daily.   citalopram  (CELEXA ) 20 MG tablet Take 30 mg by mouth daily.   furosemide  (LASIX ) 20 MG tablet Take 1 tablet (20 mg total) by mouth daily as needed.   gabapentin  (NEURONTIN ) 300 MG capsule TAKE 2 CAPSULES BY MOUTH AT BEDTIME   glucose blood test strip 1 each by Other route as needed for other. For one touch ultra meter. DX: E11.9   HUMALOG KWIKPEN 100 UNIT/ML KwikPen Inject 8 Units into the skin 3 (three) times daily.   Insulin  Pen Needle 32G X 4 MM MISC Use to inject Insulin  daily. Dx: E11.9   Insulin  Syringe-Needle U-100 (INSULIN  SYRINGE .3CC/31GX5/16") 31G X 5/16" 0.3 ML MISC 1 Units by Does not apply route 2 (two) times daily.   Lancets (ONETOUCH ULTRASOFT) lancets Use as instructed   LANTUS  SOLOSTAR 100 UNIT/ML Solostar Pen Inject 36 Units into the skin 2 (two) times daily.  metoprolol  succinate (TOPROL -XL) 100 MG 24 hr tablet Take 100 mg by mouth daily.   rOPINIRole  (REQUIP ) 0.25 MG tablet Take 0.25 mg by mouth at bedtime.   sacubitril -valsartan  (ENTRESTO ) 24-26 MG 0.5 tablet daily   sitaGLIPtin (JANUVIA) 100 MG tablet Take 1 tablet by mouth daily.   Budeson-Glycopyrrol-Formoterol (BREZTRI  AEROSPHERE) 160-9-4.8 MCG/ACT AERO Inhale 2 puffs into the lungs in the morning and at bedtime. (Patient not taking: Reported on 11/29/2023)   ezetimibe  (ZETIA ) 10 MG tablet Take 1 tablet by mouth daily.   nitroGLYCERIN  (NITROSTAT ) 0.4 MG SL tablet Place 1 tablet (0.4 mg  total) under the tongue every 5 (five) minutes as needed for chest pain.   pantoprazole  (PROTONIX ) 20 MG tablet Take 1 tablet (20 mg total) by mouth daily. (Patient not taking: Reported on 11/29/2023)   No facility-administered encounter medications on file as of 11/29/2023.    Allergies as of 11/29/2023 - Review Complete 11/29/2023  Allergen Reaction Noted   Latex Rash 06/16/2016    Past Medical History:  Diagnosis Date   Abrasion L FOREARM   Atrial fibrillation (HCC)    a. initially occurring in post-op setting in 05/2016, started on Eliquis  b. 08/2016: s/p clipping of atrial appendage at time of CABG --> Eliquis  switched to Coumadin    CAD (coronary artery disease)    a. 05/2016: NSTEMI, cath showing 3v disease with CABG recommended once MRSA infection resolved. b. 08/2016: CABG with LIMA-LAD, SVG-D1, and SVG-RI.   Cancer (HCC)    basal cell carcinoma of the skin   CHF (congestive heart failure) (HCC)    Diabetes mellitus without complication (HCC)    Headache(784.0)    Hematuria, microscopic "SINCE I WAS A KID"   Hyperlipidemia    Hypertension    Ischemic cardiomyopathy    a. 05/2016: echo w/ EF of 25-35% and akinesis of the basal-midinferolateral and inferior myocardium.   Lactose intolerance    Myocardial infarction (HCC)    Respiratory failure (HCC)    Rotator cuff tear RIGHT   Tobacco use    a. quit in 05/2016 following MI    Past Surgical History:  Procedure Laterality Date   AMPUTATION TOE Left 02/06/2020   5th ray amputation with graph [facility])   BRONCHIAL BRUSHINGS  08/01/2022   Procedure: BRONCHIAL BRUSHINGS;  Surgeon: Josiah Nigh, MD;  Location: [facility] ENDOSCOPY;  Service: Pulmonary;;   BRONCHIAL WASHINGS  08/01/2022   Procedure: BRONCHIAL WASHINGS;  Surgeon: Josiah Nigh, MD;  Location: [facility] ENDOSCOPY;  Service: Pulmonary;;   CARDIAC CATHETERIZATION N/A 06/16/2016   Procedure: Left Heart Cath and Coronary Angiography;  Surgeon: Millicent Ally, MD;  Location:  [facility] INVASIVE CV LAB;  Service: Cardiovascular;  Laterality: N/A;   CLIPPING OF ATRIAL APPENDAGE N/A 08/24/2016   Procedure: CLIPPING OF ATRIAL APPENDAGE;  Surgeon: Heriberto London, MD;  Location: [facility] OR;  Service: Open Heart Surgery;  Laterality: N/A;   COLONOSCOPY WITH PROPOFOL  N/A 02/12/2022   Procedure: COLONOSCOPY WITH PROPOFOL ;  Surgeon: Marnee Sink, MD;  Location: [facility] ENDOSCOPY;  Service: Endoscopy;  Laterality: N/A;   CORONARY ARTERY BYPASS GRAFT N/A 08/24/2016   Procedure: CORONARY ARTERY BYPASS GRAFTING (CABG) x three,  using left internal mammary artery and right leg greater saphenous vein harvested endoscopically;  Surgeon: Heriberto London, MD;  Location: [facility] OR;  Service: Open Heart Surgery;  Laterality: N/A;   HERNIA REPAIR  X2   INCISION AND DRAINAGE ABSCESS Left 06/10/2016   Procedure: INCISION AND DRAINAGE LEFT AXILLARY ABSCESS;  Surgeon: Donavon Fudge  Gladys Lamp, MD;  Location: [facility] ORS;  Service: General;  Laterality: Left;   ROTATOR CUFF REPAIR Bilateral    TEE WITHOUT CARDIOVERSION N/A 08/24/2016   Procedure: TRANSESOPHAGEAL ECHOCARDIOGRAM (TEE);  Surgeon: Heriberto London, MD;  Location: [facility] OR;  Service: Open Heart Surgery;  Laterality: N/A;   VIDEO BRONCHOSCOPY Right 08/01/2022   Procedure: VIDEO BRONCHOSCOPY WITH FLUORO;  Surgeon: Josiah Nigh, MD;  Location: [facility] ENDOSCOPY;  Service: Pulmonary;  Laterality: Right;    Family History  Problem Relation Age of Onset   Hypertension Mother    Breast cancer Mother    Hypertension Father    Sudden death Father 31       at age of death   Diabetes Father    Heart attack Father    Heart attack Sister    Cancer Maternal Aunt        breast x2 aunts    Social History   Socioeconomic History   Marital status: Married    Spouse name: Not on file   Number of children: Not on file   Years of education: Not on file   Highest education level: Not on file  Occupational History   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  Tobacco  Use   Smoking status: Some Days    Current packs/day: 1.50    Average packs/day: 1.5 packs/day for 42.0 years (63.0 ttl pk-yrs)    Types: Cigarettes   Smokeless tobacco: Never   Tobacco comments:    Currently 1.5 pk a day 11/29/23  Substance and Sexual Activity   Alcohol use: No   Drug use: No   Sexual activity: Not on file  Other Topics Concern   Not on file  Social History Narrative   Not on file   Social Drivers of Health   Financial Resource Strain: Low Risk  (08/10/2022)   Overall Financial Resource Strain (CARDIA)    Difficulty of Paying Living Expenses: Not hard at all  Food Insecurity: No Food Insecurity (08/10/2022)   Hunger Vital Sign    Worried About Running Out of Food in the Last Year: Never true    Ran Out of Food in the Last Year: Never true  Transportation Needs: No Transportation Needs (08/10/2022)   PRAPARE - Administrator, Civil Service (Medical): No    Lack of Transportation (Non-Medical): No  Physical Activity: Insufficiently Active (08/10/2022)   Exercise Vital Sign    Days of Exercise per Week: 2 days    Minutes of Exercise per Session: 20 min  Stress: No Stress Concern Present (08/10/2022)   [employer] of Occupational Health - Occupational Stress Questionnaire    Feeling of Stress : Only a little  Social Connections: Socially Isolated (08/10/2022)   Social Connection and Isolation Panel [NHANES]    Frequency of Communication with Friends and Family: More than three times a week    Frequency of Social Gatherings with Friends and Family: More than three times a week    Attends Religious Services: Never    Database administrator or Organizations: No    Attends Banker Meetings: Never    Marital Status: Separated  Intimate Partner Violence: Not At Risk (08/10/2022)   Humiliation, Afraid, Rape, and Kick questionnaire    Fear of Current or Ex-Partner: No    Emotionally Abused: No    Physically Abused: No    Sexually  Abused: No    Review of Systems  Respiratory:  Positive  for apnea and shortness of breath.   Psychiatric/Behavioral:  Positive for sleep disturbance.     Vitals:   11/29/23 1104  BP: (!) 155/87  Pulse: 76  SpO2: 96%     Physical Exam Constitutional:      Appearance: Normal appearance.  HENT:     Head: Normocephalic.     Mouth/Throat:     Mouth: Mucous membranes are moist.  Eyes:     General: No scleral icterus. Cardiovascular:     Rate and Rhythm: Normal rate and regular rhythm.     Heart sounds: No murmur heard.    No friction rub.  Pulmonary:     Effort: No respiratory distress.     Breath sounds: No stridor. No wheezing or rhonchi.  Musculoskeletal:     Cervical back: No rigidity or tenderness.  Neurological:     Mental Status: He is alert.  Psychiatric:        Mood and Affect: Mood normal.    Data Reviewed: Pulmonary function test reveals moderate obstruction with significant bronchodilator response  Sleep study 01/17/2021 with AHI of 56, O2 nadir of 42%  Has not been using CPAP because of his chronic nasal stuffiness and congestion  Assessment:  Severe obstructive sleep apnea -Unable to tolerate CPAP -Will consider other options of treatment -He states he is not very bothered about it  His neuropathy does bother him more than anything else -He is on Neurontin   Moderate obstructive lung disease -Continue Breztri   Smoking cessation counseling  Nasal stuffiness and congestion prevented him from being able to use his CPAP at present -Zyrtec  10 mg nightly -Astepro  2 sprays each nostril daily in addition to Flonase  Plan/Recommendations:  We will make sure that he gets a referral to Dr. Boby Bury of ENT Chronic nasal congestion and stuffiness, evaluation for an inspire device  Continue Breztri   Follow-up in about 3 months  Encouraged to optimize sleep hygiene as best as he can  Follow-up with neurology for optimization of management for his  neuropathy    Myer Artis MD [facility] 11/29/2023, 11:15 AM  CC: Dorothe Gaster, NP

## 2023-11-30 ENCOUNTER — Other Ambulatory Visit: Payer: Self-pay | Admitting: Orthopedic Surgery

## 2023-11-30 DIAGNOSIS — M75122 Complete rotator cuff tear or rupture of left shoulder, not specified as traumatic: Secondary | ICD-10-CM

## 2023-12-08 ENCOUNTER — Ambulatory Visit
Admission: RE | Admit: 2023-12-08 | Discharge: 2023-12-08 | Disposition: A | Discharge status: Home / Self Care | Payer: 59 | Source: Ambulatory Visit | Attending: Orthopedic Surgery | Admitting: Orthopedic Surgery

## 2023-12-08 DIAGNOSIS — S46812D Strain of other muscles, fascia and tendons at shoulder and upper arm level, left arm, subsequent encounter: Secondary | ICD-10-CM | POA: Diagnosis not present

## 2023-12-08 DIAGNOSIS — M75122 Complete rotator cuff tear or rupture of left shoulder, not specified as traumatic: Secondary | ICD-10-CM | POA: Insufficient documentation

## 2023-12-08 DIAGNOSIS — S46212D Strain of muscle, fascia and tendon of other parts of biceps, left arm, subsequent encounter: Secondary | ICD-10-CM | POA: Diagnosis not present

## 2023-12-08 DIAGNOSIS — S46112D Strain of muscle, fascia and tendon of long head of biceps, left arm, subsequent encounter: Secondary | ICD-10-CM | POA: Diagnosis not present

## 2023-12-09 ENCOUNTER — Encounter: Payer: Self-pay | Admitting: Nurse Practitioner

## 2023-12-25 DIAGNOSIS — E119 Type 2 diabetes mellitus without complications: Secondary | ICD-10-CM | POA: Diagnosis not present

## 2023-12-27 DIAGNOSIS — I5020 Unspecified systolic (congestive) heart failure: Secondary | ICD-10-CM | POA: Diagnosis not present

## 2023-12-27 DIAGNOSIS — Z794 Long term (current) use of insulin: Secondary | ICD-10-CM | POA: Diagnosis not present

## 2023-12-27 DIAGNOSIS — G4733 Obstructive sleep apnea (adult) (pediatric): Secondary | ICD-10-CM | POA: Diagnosis not present

## 2023-12-27 DIAGNOSIS — E1165 Type 2 diabetes mellitus with hyperglycemia: Secondary | ICD-10-CM | POA: Diagnosis not present

## 2023-12-31 DIAGNOSIS — I872 Venous insufficiency (chronic) (peripheral): Secondary | ICD-10-CM | POA: Diagnosis not present

## 2023-12-31 DIAGNOSIS — M75122 Complete rotator cuff tear or rupture of left shoulder, not specified as traumatic: Secondary | ICD-10-CM | POA: Diagnosis not present

## 2023-12-31 DIAGNOSIS — L97211 Non-pressure chronic ulcer of right calf limited to breakdown of skin: Secondary | ICD-10-CM | POA: Diagnosis not present

## 2023-12-31 DIAGNOSIS — M7582 Other shoulder lesions, left shoulder: Secondary | ICD-10-CM | POA: Diagnosis not present

## 2023-12-31 DIAGNOSIS — M12812 Other specific arthropathies, not elsewhere classified, left shoulder: Secondary | ICD-10-CM | POA: Diagnosis not present

## 2024-01-06 ENCOUNTER — Ambulatory Visit

## 2024-01-24 DIAGNOSIS — Z85828 Personal history of other malignant neoplasm of skin: Secondary | ICD-10-CM | POA: Diagnosis not present

## 2024-01-24 DIAGNOSIS — E119 Type 2 diabetes mellitus without complications: Secondary | ICD-10-CM | POA: Diagnosis not present

## 2024-01-24 DIAGNOSIS — C44529 Squamous cell carcinoma of skin of other part of trunk: Secondary | ICD-10-CM | POA: Diagnosis not present

## 2024-01-24 DIAGNOSIS — D225 Melanocytic nevi of trunk: Secondary | ICD-10-CM | POA: Diagnosis not present

## 2024-01-24 DIAGNOSIS — L814 Other melanin hyperpigmentation: Secondary | ICD-10-CM | POA: Diagnosis not present

## 2024-01-24 DIAGNOSIS — L570 Actinic keratosis: Secondary | ICD-10-CM | POA: Diagnosis not present

## 2024-01-24 DIAGNOSIS — Z08 Encounter for follow-up examination after completed treatment for malignant neoplasm: Secondary | ICD-10-CM | POA: Diagnosis not present

## 2024-01-24 DIAGNOSIS — L821 Other seborrheic keratosis: Secondary | ICD-10-CM | POA: Diagnosis not present

## 2024-01-24 DIAGNOSIS — D485 Neoplasm of uncertain behavior of skin: Secondary | ICD-10-CM | POA: Diagnosis not present

## 2024-02-11 ENCOUNTER — Ambulatory Visit: Payer: 59 | Admitting: Nurse Practitioner

## 2024-02-11 VITALS — BP 100/62 | HR 72 | Temp 98.0°F | Ht 65.0 in | Wt 171.6 lb

## 2024-02-11 DIAGNOSIS — R748 Abnormal levels of other serum enzymes: Secondary | ICD-10-CM | POA: Diagnosis not present

## 2024-02-11 DIAGNOSIS — I10 Essential (primary) hypertension: Secondary | ICD-10-CM

## 2024-02-11 DIAGNOSIS — I5020 Unspecified systolic (congestive) heart failure: Secondary | ICD-10-CM

## 2024-02-11 DIAGNOSIS — Z8669 Personal history of other diseases of the nervous system and sense organs: Secondary | ICD-10-CM

## 2024-02-11 DIAGNOSIS — J9611 Chronic respiratory failure with hypoxia: Secondary | ICD-10-CM | POA: Diagnosis not present

## 2024-02-11 LAB — CBC
HCT: 47.3 % (ref 39.0–52.0)
Hemoglobin: 15.6 g/dL (ref 13.0–17.0)
MCHC: 33 g/dL (ref 30.0–36.0)
MCV: 89.9 fl (ref 78.0–100.0)
Platelets: 120 10*3/uL — ABNORMAL LOW (ref 150.0–400.0)
RBC: 5.26 Mil/uL (ref 4.22–5.81)
RDW: 14.4 % (ref 11.5–15.5)
WBC: 9.1 10*3/uL (ref 4.0–10.5)

## 2024-02-11 LAB — BASIC METABOLIC PANEL WITH GFR
BUN: 27 mg/dL — ABNORMAL HIGH (ref 6–23)
CO2: 30 meq/L (ref 19–32)
Calcium: 8.6 mg/dL (ref 8.4–10.5)
Chloride: 104 meq/L (ref 96–112)
Creatinine, Ser: 1.63 mg/dL — ABNORMAL HIGH (ref 0.40–1.50)
GFR: 44.83 mL/min — ABNORMAL LOW (ref >60.00)
Glucose, Bld: 132 mg/dL — ABNORMAL HIGH (ref 70–99)
Potassium: 4.1 meq/L (ref 3.5–5.1)
Sodium: 142 meq/L (ref 135–145)

## 2024-02-11 LAB — LIPASE: Lipase: 98 U/L — ABNORMAL HIGH (ref 11.0–59.0)

## 2024-02-11 NOTE — Assessment & Plan Note (Signed)
 Currently on entreso, lasix , and metoprolol . Continue taking medications as prescribed. Continue following with cardiology as recommended

## 2024-02-11 NOTE — Assessment & Plan Note (Signed)
 On breztri  and albuterol . Not the most adherent with breztri . He is followed by pulmonary. Continue taking the medications as prescribed. Follow up with pulmonary as recommended

## 2024-02-11 NOTE — Assessment & Plan Note (Signed)
 On gabapentin  600mg  at bedtime. Is ineffective. Patient does have a history of MDD an OSA. He cannot tolerate the CPAP and I do not want to give him anything sedating. Int he regards of MDD not sure a TCA would be appropriate with his current therapy. Defer to neurology. Patient to reach out to them for further management

## 2024-02-11 NOTE — Progress Notes (Signed)
 Established Patient Office Visit  Subjective   Patient ID: [name], male    DOB: Nov 16, 1960  Age: 63 y.o. MRN: [id number]  Chief Complaint  Patient presents with   Follow-up    HPI  HTN: patient is currently on furosemide  20mg , metoprolol  100mg , entresto .  Patient is currently followed by [facility] cardiology.  CHF: currently on entresto  and lasix  he is followed by cardiology, Dalbert Dubois, through [facility] cardiology   COPD/OSA: patient is followed by pulmonary Dr. Myer Artis. Last offive visit was 11/29/2023. He is prescirbed albuterol  as needed and breztri . States that he mainly once a day dose with the brezetri. States that the albuterol  has been 2-3 times   Neuropathy: his is doing gabpaetin 600mg  at bedtime. He is mentioning that it is hard for him to sleep due to the pain and jerking of his legs. He states that he has a history of RLS but his is worse. He does not feel that the gabapentin  is working any longer. He is followed by neurology    Review of Systems  Constitutional:  Negative for chills and fever.  Eyes: Negative.   Respiratory:  Positive for shortness of breath.   Cardiovascular:  Negative for chest pain.  Neurological:  Positive for tingling. Negative for headaches.      Objective:     BP 100/62   Pulse 72   Temp 98 F (36.7 C) (Oral)   Ht 5\' 5"  (1.651 m)   Wt 171 lb 9.6 oz (77.8 kg)   SpO2 97%   BMI 28.56 kg/m  BP Readings from Last 3 Encounters:  02/11/24 100/62  11/29/23 (!) 155/87  11/02/23 127/72   Wt Readings from Last 3 Encounters:  02/11/24 171 lb 9.6 oz (77.8 kg)  11/29/23 175 lb 3.2 oz (79.5 kg)  11/17/23 170 lb (77.1 kg)   SpO2 Readings from Last 3 Encounters:  02/11/24 97%  11/29/23 96%  11/02/23 95%      Physical Exam Vitals and nursing note reviewed.  Constitutional:      Appearance: Normal appearance.  Cardiovascular:     Rate and Rhythm: Normal rate and regular rhythm.     Heart sounds: Normal heart sounds.   Pulmonary:     Effort: Pulmonary effort is normal.     Comments: Decreased  Neurological:     Mental Status: He is alert.      No results found for any visits on 02/11/24.    The ASCVD Risk score (Arnett DK, et al., 2019) failed to calculate for the following reasons:   Risk score cannot be calculated because patient has a medical history suggesting prior/existing ASCVD    Assessment & Plan:   Problem List Items Addressed This Visit       Cardiovascular and Mediastinum   Essential hypertension   Currently on entreso, lasix , and metoprolol . Continue taking medications as prescribed. Continue following with cardiology as recommended       Relevant Orders   CBC   Basic metabolic panel with GFR   HFrEF (heart failure with reduced ejection fraction) (HCC) - Primary   Currently on entreso, lasix , and metoprolol . Continue taking medications as prescribed. Continue following with cardiology as recommended         Respiratory   Chronic respiratory failure with hypoxia (HCC)   On breztri  and albuterol . Not the most adherent with breztri . He is followed by pulmonary. Continue taking the medications as prescribed. Follow up with pulmonary as recommended  Other   History of peripheral neuropathy   On gabapentin  600mg  at bedtime. Is ineffective. Patient does have a history of MDD an OSA. He cannot tolerate the CPAP and I do not want to give him anything sedating. Int he regards of MDD not sure a TCA would be appropriate with his current therapy. Defer to neurology. Patient to reach out to them for further management       Other Visit Diagnoses       Elevated lipase       Relevant Orders   Lipase       Return in about 6 months (around 08/12/2024) for CPE and Labs.    Margarie Shay, NP

## 2024-02-14 ENCOUNTER — Other Ambulatory Visit: Payer: Self-pay | Admitting: Pulmonary Disease

## 2024-02-14 ENCOUNTER — Ambulatory Visit (INDEPENDENT_AMBULATORY_CARE_PROVIDER_SITE_OTHER): Payer: 59 | Admitting: Podiatry

## 2024-02-14 ENCOUNTER — Encounter: Payer: Self-pay | Admitting: Podiatry

## 2024-02-14 VITALS — Ht 65.0 in | Wt 171.0 lb

## 2024-02-14 DIAGNOSIS — E1142 Type 2 diabetes mellitus with diabetic polyneuropathy: Secondary | ICD-10-CM | POA: Diagnosis not present

## 2024-02-14 DIAGNOSIS — B351 Tinea unguium: Secondary | ICD-10-CM | POA: Diagnosis not present

## 2024-02-14 DIAGNOSIS — M79609 Pain in unspecified limb: Secondary | ICD-10-CM

## 2024-02-14 DIAGNOSIS — L84 Corns and callosities: Secondary | ICD-10-CM | POA: Diagnosis not present

## 2024-02-14 NOTE — Patient Instructions (Addendum)
 Ask your PCP about switching from gabapentin  to pregabalin (AKA Lyrica)

## 2024-02-14 NOTE — Progress Notes (Signed)
  Subjective:  Patient ID: [name], male    DOB: May 01, 1961,  MRN: [id number]  Chief Complaint  Patient presents with   Diabetes    Patient is here for Routine [facility]    63 y.o. male presents with the above complaint. History confirmed with patient.  He returns for follow-up.  Notes burning tingling shooting pain and spasms. Nails painful thick and elongated, the callus on the fifth metatarsal right foot has built up again and causing pain.  Started out 2 to 3 weeks ago with pain.  Objective:  Physical Exam: warm, good capillary refill, no trophic changes or ulcerative lesions, normal DP and PT pulses, and he has an abnormal sensory exam with loss of protective sensation. Left Foot: dystrophic yellowed discolored nail plates with subungual debris Right Foot: dystrophic yellowed discolored nail plates with subungual debris and plantar fifth metatarsal there is a painful hyperkeratosis  Radiographs of both feet taken today show no osteomyelitis of the areas of concern of the fifth metatarsal on the right foot or left fourth toe  Assessment:   1. Pain due to onychomycosis of nail   2. Diabetic polyneuropathy associated with type 2 diabetes mellitus (HCC)   3. Type 2 diabetes mellitus with peripheral neuropathy (HCC)   4. Callus       Plan:  Patient was evaluated and treated and all questions answered.  Discussed the etiology and treatment options for the condition in detail with the patient. Prior debridements have been helpful. Recommended debridement of the nails today. Sharp and mechanical debridement performed of all painful and mycotic nails today. Nails debrided in length and thickness using a nail nipper to level of comfort. Discussed treatment options including appropriate shoe gear. Follow up as needed for painful nails.  All symptomatic hyperkeratoses were safely debrided with a sterile #15 blade to patient's level of comfort without incident.  Regular debridement has been  helpful in reducing pain and improving function  Today separately we also discussed his neuropathic type pain.  He takes gabapentin  at night now.  Started to have more burning tingling pain despite being on 600 mg for several months.  We discussed alternative therapy with Lyrica he will discuss this with his PCP and/or prescribing doctor.  We also discussed if not improving alternative therapies such as spinal cord stimulation could offer relief as well and he will let me know if he would like referral for this.  Unfortunate not something that is likely to improve for him and will be a long-term issue.  Return in about 3 months (around 05/15/2024) for at risk diabetic foot care.

## 2024-02-15 ENCOUNTER — Encounter: Payer: Self-pay | Admitting: Nurse Practitioner

## 2024-02-15 DIAGNOSIS — L905 Scar conditions and fibrosis of skin: Secondary | ICD-10-CM | POA: Diagnosis not present

## 2024-02-15 DIAGNOSIS — C44529 Squamous cell carcinoma of skin of other part of trunk: Secondary | ICD-10-CM | POA: Diagnosis not present

## 2024-02-16 ENCOUNTER — Other Ambulatory Visit: Payer: Self-pay | Admitting: Surgery

## 2024-02-16 DIAGNOSIS — M12812 Other specific arthropathies, not elsewhere classified, left shoulder: Secondary | ICD-10-CM | POA: Diagnosis not present

## 2024-02-16 DIAGNOSIS — G4733 Obstructive sleep apnea (adult) (pediatric): Secondary | ICD-10-CM | POA: Diagnosis not present

## 2024-02-16 DIAGNOSIS — E1165 Type 2 diabetes mellitus with hyperglycemia: Secondary | ICD-10-CM | POA: Diagnosis not present

## 2024-02-16 DIAGNOSIS — Z9889 Other specified postprocedural states: Secondary | ICD-10-CM | POA: Diagnosis not present

## 2024-02-16 DIAGNOSIS — J449 Chronic obstructive pulmonary disease, unspecified: Secondary | ICD-10-CM | POA: Diagnosis not present

## 2024-02-16 DIAGNOSIS — Z8679 Personal history of other diseases of the circulatory system: Secondary | ICD-10-CM | POA: Diagnosis not present

## 2024-02-16 DIAGNOSIS — I214 Non-ST elevation (NSTEMI) myocardial infarction: Secondary | ICD-10-CM | POA: Diagnosis not present

## 2024-02-16 DIAGNOSIS — Z794 Long term (current) use of insulin: Secondary | ICD-10-CM | POA: Diagnosis not present

## 2024-02-16 DIAGNOSIS — M75122 Complete rotator cuff tear or rupture of left shoulder, not specified as traumatic: Secondary | ICD-10-CM | POA: Diagnosis not present

## 2024-02-16 DIAGNOSIS — Z951 Presence of aortocoronary bypass graft: Secondary | ICD-10-CM | POA: Diagnosis not present

## 2024-02-21 ENCOUNTER — Encounter: Payer: Self-pay | Admitting: Pulmonary Disease

## 2024-02-21 ENCOUNTER — Other Ambulatory Visit: Payer: Self-pay | Admitting: Pulmonary Disease

## 2024-02-21 ENCOUNTER — Ambulatory Visit: Payer: 59 | Admitting: Pulmonary Disease

## 2024-02-21 VITALS — BP 90/60 | HR 72 | Temp 97.5°F | Ht 65.0 in | Wt 174.2 lb

## 2024-02-21 DIAGNOSIS — F17210 Nicotine dependence, cigarettes, uncomplicated: Secondary | ICD-10-CM | POA: Diagnosis not present

## 2024-02-21 DIAGNOSIS — G2581 Restless legs syndrome: Secondary | ICD-10-CM | POA: Diagnosis not present

## 2024-02-21 DIAGNOSIS — J449 Chronic obstructive pulmonary disease, unspecified: Secondary | ICD-10-CM

## 2024-02-21 DIAGNOSIS — G4733 Obstructive sleep apnea (adult) (pediatric): Secondary | ICD-10-CM

## 2024-02-21 DIAGNOSIS — G609 Hereditary and idiopathic neuropathy, unspecified: Secondary | ICD-10-CM | POA: Diagnosis not present

## 2024-02-21 MED ORDER — ROPINIROLE HCL 0.5 MG PO TABS
0.5000 mg | ORAL_TABLET | Freq: Three times a day (TID) | ORAL | 2 refills | Status: DC
Start: 2024-02-21 — End: 2024-02-22

## 2024-02-21 MED ORDER — PREGABALIN 75 MG PO CAPS: 75.0000 mg | ORAL_CAPSULE | Freq: Two times a day (BID) | ORAL | 1 refills | Status: DC

## 2024-02-21 NOTE — Progress Notes (Signed)
 [name]    [id number]    1961/02/08  Primary Care Physician:Cable, Hoy Mackintosh, NP  Referring Physician: Dorothe Gaster, NP [address] [place],  [place] [address]  Chief complaint:   Patient with a history of obstructive sleep apnea History of obstructive lung disease  HPI:  Patient with severe obstructive sleep apnea for which she tried CPAP - Intolerant of CPAP because of chronic nasal stuffiness and congestion - Had referral to Dr. Soldotova which has not happened yet  He does have restless legs for which he is on gabapentin  at 600 mg, this does not seem to be helping, has also been on 0.25 of Requip Following discussions today he wanted to try something else for his restless legs as this is contributing to significant difficulty sleeping  He uses Flonase for nasal stuffiness, has been using Zyrtec   An inspire device discussed as an option of treatment for his sleep disordered breathing as well  He has a history of obstructive lung disease, on 5 L has not been able to quit smoking yet  He continues to have significant nasal stuffiness and congestion despite using nasal steroids Smokes about a pack and a half a day  He was hospitalized in October 2023 for respiratory failure, pneumonia  Has underlying history of atrial fibrillation, diabetes, coronary artery disease  Still working on compliance with Breztri   He was having significant enough difficulty with using the CPAP and felt it was actually keeping him from being able to get enough rest  Outpatient Encounter Medications as of 02/21/2024  Medication Sig   acetaminophen  (TYLENOL ) 500 MG tablet Take 1,000 mg by mouth every 6 (six) hours as needed for headache.   albuterol  (VENTOLIN  HFA) 108 (90 Base) MCG/ACT inhaler Inhale 1-2 puffs into the lungs every 6 (six) hours as needed for wheezing or shortness of breath.   aspirin  81 MG chewable tablet Chew 1 tablet (81 mg total) by mouth daily.    atorvastatin  (LIPITOR ) 80 MG tablet Take 1 tablet (80 mg total) by mouth at bedtime.   Budeson-Glycopyrrol-Formoterol (BREZTRI  AEROSPHERE) 160-9-4.8 MCG/ACT AERO Inhale 2 puffs into the lungs in the morning and at bedtime. (Patient taking differently: Inhale 2 puffs into the lungs 2 (two) times daily as needed (shortness of breath).)   cetirizine  (ZYRTEC ) 10 MG tablet Take 1 tablet by mouth once daily   citalopram  (CELEXA ) 20 MG tablet Take 20 mg by mouth daily. Take with 10 to equal 30 mg daily   ezetimibe  (ZETIA ) 10 MG tablet Take 1 tablet by mouth daily.   furosemide  (LASIX ) 20 MG tablet Take 1 tablet (20 mg total) by mouth daily as needed.   gabapentin  (NEURONTIN ) 300 MG capsule TAKE 2 CAPSULES BY MOUTH AT BEDTIME   glucose blood test strip 1 each by Other route as needed for other. For one touch ultra meter. DX: E11.9   HUMALOG KWIKPEN 100 UNIT/ML KwikPen Inject 20 Units into the skin 3 (three) times daily with meals.   Insulin  Pen Needle 32G X 4 MM MISC Use to inject Insulin  daily. Dx: E11.9   Insulin  Syringe-Needle U-100 (INSULIN  SYRINGE .3CC/31GX5/16") 31G X 5/16" 0.3 ML MISC 1 Units by Does not apply route 2 (two) times daily.   Lancets (ONETOUCH ULTRASOFT) lancets Use as instructed   LANTUS  SOLOSTAR 100 UNIT/ML Solostar Pen Inject 32 Units into the skin 2 (two) times daily.   metoprolol  succinate (TOPROL -XL) 100 MG 24 hr tablet  Take 100 mg by mouth daily.   nitroGLYCERIN  (NITROSTAT ) 0.4 MG SL tablet Place 1 tablet (0.4 mg total) under the tongue every 5 (five) minutes as needed for chest pain.   rOPINIRole (REQUIP) 0.25 MG tablet Take 0.25 mg by mouth at bedtime.   sacubitril -valsartan  (ENTRESTO ) 24-26 MG 0.5 tablet daily   sitaGLIPtin (JANUVIA) 100 MG tablet Take 1 tablet by mouth daily.   No facility-administered encounter medications on file as of 02/21/2024.    Allergies as of 02/21/2024 - Review Complete 02/18/2024  Allergen Reaction Noted   Latex Rash 06/16/2016    Past  Medical History:  Diagnosis Date   Abrasion L FOREARM   Atrial fibrillation (HCC)    a. initially occurring in post-op setting in 05/2016, started on Eliquis  b. 08/2016: s/p clipping of atrial appendage at time of CABG --> Eliquis  switched to Coumadin    CAD (coronary artery disease)    a. 05/2016: NSTEMI, cath showing 3v disease with CABG recommended once MRSA infection resolved. b. 08/2016: CABG with LIMA-LAD, SVG-D1, and SVG-RI.   Cancer (HCC)    basal cell carcinoma of the skin   CHF (congestive heart failure) (HCC)    Diabetes mellitus without complication (HCC)    Headache(784.0)    Hematuria, microscopic "SINCE I WAS A KID"   Hyperlipidemia    Hypertension    Ischemic cardiomyopathy    a. 05/2016: echo w/ EF of 25-35% and akinesis of the basal-midinferolateral and inferior myocardium.   Lactose intolerance    Myocardial infarction (HCC)    Respiratory failure (HCC)    Rotator cuff tear RIGHT   Tobacco use    a. quit in 05/2016 following MI    Past Surgical History:  Procedure Laterality Date   AMPUTATION TOE Left 02/06/2020   5th ray amputation with [facility])   BRONCHIAL BRUSHINGS  08/01/2022   Procedure: BRONCHIAL BRUSHINGS;  Surgeon: Josiah Nigh, MD;  Location: [facility] ENDOSCOPY;  Service: Pulmonary;;   BRONCHIAL WASHINGS  08/01/2022   Procedure: BRONCHIAL WASHINGS;  Surgeon: Josiah Nigh, MD;  Location: [facility] ENDOSCOPY;  Service: Pulmonary;;   CARDIAC CATHETERIZATION N/A 06/16/2016   Procedure: Left Heart Cath and Coronary Angiography;  Surgeon: Millicent Ally, MD;  Location: [facility] INVASIVE CV LAB;  Service: Cardiovascular;  Laterality: N/A;   CLIPPING OF ATRIAL APPENDAGE N/A 08/24/2016   Procedure: CLIPPING OF ATRIAL APPENDAGE;  Surgeon: Heriberto London, MD;  Location: [facility] OR;  Service: Open Heart Surgery;  Laterality: N/A;   COLONOSCOPY WITH PROPOFOL  N/A 02/12/2022   Procedure: COLONOSCOPY WITH PROPOFOL ;  Surgeon: Marnee Sink, MD;  Location: [facility] ENDOSCOPY;  Service:  Endoscopy;  Laterality: N/A;   CORONARY ARTERY BYPASS GRAFT N/A 08/24/2016   Procedure: CORONARY ARTERY BYPASS GRAFTING (CABG) x three,  using left internal mammary artery and right leg greater saphenous vein harvested endoscopically;  Surgeon: Heriberto London, MD;  Location: [facility] OR;  Service: Open Heart Surgery;  Laterality: N/A;   HERNIA REPAIR  X2   INCISION AND DRAINAGE ABSCESS Left 06/10/2016   Procedure: INCISION AND DRAINAGE LEFT AXILLARY ABSCESS;  Surgeon: Lillette Reid III, MD;  Location: [facility] ORS;  Service: General;  Laterality: Left;   ROTATOR CUFF REPAIR Bilateral    TEE WITHOUT CARDIOVERSION N/A 08/24/2016   Procedure: TRANSESOPHAGEAL ECHOCARDIOGRAM (TEE);  Surgeon: Heriberto London, MD;  Location[facility] OR;  Service: Open Heart Surgery;  Laterality: N/A;   VIDEO BRONCHOSCOPY Right 08/01/2022   Procedure: VIDEO BRONCHOSCOPY WITH FLUORO;  Surgeon: Josiah Nigh,  MD;  Location: [facility] ENDOSCOPY;  Service: Pulmonary;  Laterality: Right;    Family History  Problem Relation Age of Onset   Hypertension Mother    Breast cancer Mother    Hypertension Father    Sudden death Father 91       at age of death   Diabetes Father    Heart attack Father    Heart attack Sister    Cancer Maternal Aunt        breast x2 aunts    Social History   Socioeconomic History   Marital status: Married    Spouse name: Not on file   Number of children: Not on file   Years of education: Not on file   Highest education level: Not on file  Occupational History   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  Tobacco Use   Smoking status: Some Days    Current packs/day: 1.50    Average packs/day: 1.5 packs/day for 42.0 years (63.0 ttl pk-yrs)    Types: Cigarettes   Smokeless tobacco: Never   Tobacco comments:    Currently 1.5 pk a day 11/29/23  Substance and Sexual Activity   Alcohol use: No   Drug use: No   Sexual activity: Not on file  Other Topics Concern   Not on file  Social History  Narrative   Not on file   Social Drivers of Health   Financial Resource Strain: Low Risk  (08/10/2022)   Overall Financial Resource Strain (CARDIA)    Difficulty of Paying Living Expenses: Not hard at all  Food Insecurity: No Food Insecurity (08/10/2022)   Hunger Vital Sign    Worried About Running Out of Food in the Last Year: Never true    Ran Out of Food in the Last Year: Never true  Transportation Needs: No Transportation Needs (08/10/2022)   PRAPARE - Administrator, Civil Service (Medical): No    Lack of Transportation (Non-Medical): No  Physical Activity: Insufficiently Active (08/10/2022)   Exercise Vital Sign    Days of Exercise per Week: 2 days    Minutes of Exercise per Session: 20 min  Stress: No Stress Concern Present (08/10/2022)   [employer] of Occupational Health - Occupational Stress Questionnaire    Feeling of Stress : Only a little  Social Connections: Socially Isolated (08/10/2022)   Social Connection and Isolation Panel [NHANES]    Frequency of Communication with Friends and Family: More than three times a week    Frequency of Social Gatherings with Friends and Family: More than three times a week    Attends Religious Services: Never    Database administrator or Organizations: No    Attends Banker Meetings: Never    Marital Status: Separated  Intimate Partner Violence: Not At Risk (08/10/2022)   Humiliation, Afraid, Rape, and Kick questionnaire    Fear of Current or Ex-Partner: No    Emotionally Abused: No    Physically Abused: No    Sexually Abused: No    Review of Systems  Respiratory:  Positive for apnea and shortness of breath.   Psychiatric/Behavioral:  Positive for sleep disturbance.     There were no vitals filed for this visit.    Physical Exam Constitutional:      Appearance: Normal appearance.  HENT:     Head: Normocephalic.     Mouth/Throat:     Mouth: Mucous membranes are moist.  Eyes:      General:  No scleral icterus. Cardiovascular:     Rate and Rhythm: Normal rate and regular rhythm.     Heart sounds: No murmur heard.    No friction rub.  Pulmonary:     Effort: No respiratory distress.     Breath sounds: No stridor. No wheezing or rhonchi.  Musculoskeletal:     Cervical back: No rigidity or tenderness.  Neurological:     Mental Status: He is alert.  Psychiatric:        Mood and Affect: Mood normal.   Data Reviewed: Pulmonary function test reveals moderate obstruction with significant bronchodilator response  Sleep study 01/17/2021 with AHI of 56, O2 nadir of 42%  Has not been using CPAP because of his chronic nasal stuffiness and congestion  Assessment:   Severe obstructive sleep apnea - Still unable to tolerate CPAP  Neuropathy RLS - Switching from Neurontin  - Start on Lyrica 75 twice daily - Gradually wean off Neurontin   Smoking cessation counseling  Continue Zyrtec , Astepro  and Flonase  Continue Breztri   Plan/Recommendations:  Referral to ENT  Continue Breztri   Follow-up in 3 months  Optimize sleep hygiene  Continue to work on quitting smoking  Continue albuterol  as needed  Myer Artis MD [facility] 02/21/2024, 11:02 AM  CC: Dorothe Gaster, NP

## 2024-02-21 NOTE — Telephone Encounter (Signed)
**Note De-identified  [place] Obfuscation** Please advise 

## 2024-02-21 NOTE — Patient Instructions (Signed)
 We will make sure we have a referral to Dr. Velva Gibbons for evaluation of sleep disordered breathing, unable to tolerate CPAP  Peripheral neuropathy, restless legs - Prescription for Requip at 0.5 nightly and Lyrica 75 mg twice a day -Wean off gabapentin  over the next 3 days  Will see you back in about 3 months  Continue to work on quitting smoking  Call us  with significant concerns  Continue Breztri   Albuterol  as needed

## 2024-02-22 DIAGNOSIS — E1165 Type 2 diabetes mellitus with hyperglycemia: Secondary | ICD-10-CM | POA: Diagnosis not present

## 2024-02-22 DIAGNOSIS — Z794 Long term (current) use of insulin: Secondary | ICD-10-CM | POA: Diagnosis not present

## 2024-02-24 ENCOUNTER — Encounter: Payer: Self-pay | Admitting: Pulmonary Disease

## 2024-02-25 ENCOUNTER — Other Ambulatory Visit: Payer: Self-pay

## 2024-02-25 ENCOUNTER — Encounter
Admission: RE | Admit: 2024-02-25 | Discharge: 2024-02-25 | Disposition: A | Discharge status: Home / Self Care | Source: Ambulatory Visit | Attending: Surgery | Admitting: Surgery

## 2024-02-25 DIAGNOSIS — R9431 Abnormal electrocardiogram [ECG] [EKG]: Secondary | ICD-10-CM | POA: Diagnosis not present

## 2024-02-25 DIAGNOSIS — Z01810 Encounter for preprocedural cardiovascular examination: Secondary | ICD-10-CM

## 2024-02-25 DIAGNOSIS — Z01812 Encounter for preprocedural laboratory examination: Secondary | ICD-10-CM | POA: Diagnosis present

## 2024-02-25 DIAGNOSIS — Z01818 Encounter for other preprocedural examination: Secondary | ICD-10-CM | POA: Diagnosis not present

## 2024-02-25 HISTORY — DX: Gastroparesis: K31.84

## 2024-02-25 HISTORY — DX: Sleep apnea, unspecified: G47.30

## 2024-02-25 HISTORY — DX: Other specific arthropathies, not elsewhere classified, left shoulder: M12.812

## 2024-02-25 HISTORY — DX: Chronic kidney disease, unspecified: N18.9

## 2024-02-25 HISTORY — DX: Restless legs syndrome: G25.81

## 2024-02-25 HISTORY — DX: Polyneuropathy, unspecified: G62.9

## 2024-02-25 HISTORY — DX: Personal history of other diseases of the circulatory system: Z98.890

## 2024-02-25 HISTORY — DX: Chronic obstructive pulmonary disease, unspecified: J44.9

## 2024-02-25 HISTORY — DX: Other complications of anesthesia, initial encounter: T88.59XA

## 2024-02-25 HISTORY — DX: Long term (current) use of insulin: Z79.4

## 2024-02-25 HISTORY — DX: Type 2 diabetes mellitus without complications: E11.9

## 2024-02-25 HISTORY — DX: Thrombocytopenia, unspecified: D69.6

## 2024-02-25 HISTORY — DX: Unspecified rotator cuff tear or rupture of left shoulder, not specified as traumatic: M75.102

## 2024-02-25 HISTORY — DX: Presence of aortocoronary bypass graft: Z95.1

## 2024-02-25 LAB — URINALYSIS, ROUTINE W REFLEX MICROSCOPIC
Bilirubin Urine: NEGATIVE
Glucose, UA: NEGATIVE mg/dL
Ketones, ur: NEGATIVE mg/dL
Leukocytes,Ua: NEGATIVE
Nitrite: NEGATIVE
Protein, ur: 100 mg/dL — AB
Specific Gravity, Urine: 1.015 (ref 1.005–1.030)
Squamous Epithelial / HPF: 0 /HPF (ref 0–5)
pH: 5 (ref 5.0–8.0)

## 2024-02-25 LAB — SURGICAL PCR SCREEN
MRSA, PCR: NEGATIVE
Staphylococcus aureus: NEGATIVE

## 2024-02-25 NOTE — Telephone Encounter (Signed)
 Please advise as we have conflicting directions

## 2024-02-25 NOTE — Patient Instructions (Addendum)
 Your procedure is scheduled on:03-02-24 Thursday Report to the Registration Desk on the 1st floor of the [facility].Then proceed to the 2nd floor Surgery Desk To find out your arrival time, please call [phone] between 1PM - 3PM on:03-01-24 Wednesday If your arrival time is 6:00 am, do not arrive before that time as the [facility] entrance doors do not open until 6:00 am.  REMEMBER: Instructions that are not followed completely may result in serious medical risk, up to and including death; or upon the discretion of your surgeon and anesthesiologist your surgery may need to be rescheduled.  Do not eat food after midnight the night before surgery.  No gum chewing or hard candies.  You may however, drink Water  up to 2 hours before you are scheduled to arrive for your surgery. Do not drink anything within 2 hours of your scheduled arrival time.  In addition, your doctor has ordered for you to drink the provided:  Gatorade G2 Drinking this carbohydrate drink up to two hours before surgery helps to reduce insulin  resistance and improve patient outcomes. Please complete drinking 2 hours before scheduled arrival time.  One week prior to surgery:Stop NOW (02-25-24) Stop Anti-inflammatories (NSAIDS) such as Advil, Aleve, Ibuprofen, Motrin, Naproxen, Naprosyn and Aspirin  based products such as Excedrin, Goody's Powder, BC Powder. Stop ANY OVER THE COUNTER supplements until after surgery   You may however, continue to take Tylenol  if needed for pain up until the day of surgery.  Stop tirzepatide [facility]) 7 days prior to surgery-Do NOT take again until AFTER your surgery  Stop 81 mg Aspirin  7 days prior to surgery as instructed by Dr Edyth Grana dose was on 02-24-24 Thursday  Continue taking all of your other prescription medications up until the day of surgery.  ON THE DAY OF SURGERY ONLY TAKE THESE MEDICATIONS WITH SIPS OF WATER : -cetirizine  (ZYRTEC )  -citalopram  (CELEXA )  -ezetimibe   (ZETIA )  -metoprolol  succinate (TOPROL -XL)  -pregabalin  (LYRICA )  -rOPINIRole  (REQUIP )   Use your Albuterol  Inhaler the day of surgery and bring your Albuterol  Inhaler to the hospital  Take half of your Lantus  Insulin  (16 units) the night prior to surgery and NO Insulin  the morning of surgery  No Alcohol for 24 hours before or after surgery.  No Smoking including e-cigarettes for 24 hours before surgery.  No chewable tobacco products for at least 6 hours before surgery.  No nicotine  patches on the day of surgery.  Do not use any "recreational" drugs for at least a week (preferably 2 weeks) before your surgery.  Please be advised that the combination of cocaine and anesthesia may have negative outcomes, up to and including death. If you test positive for cocaine, your surgery will be cancelled.  On the morning of surgery brush your teeth with toothpaste and water , you may rinse your mouth with mouthwash if you wish. Do not swallow any toothpaste or mouthwash.  Use CHG Soap as directed on instruction sheet.  Do not wear jewelry, make-up, hairpins, clips or nail polish.  For welded (permanent) jewelry: bracelets, anklets, waist bands, etc.  Please have this removed prior to surgery.  If it is not removed, there is a chance that hospital personnel will need to cut it off on the day of surgery.  Do not wear lotions, powders, or perfumes.   Do not shave body hair from the neck down 48 hours before surgery.  Contact lenses, hearing aids and dentures may not be worn into surgery.  Do not bring valuables to  the hospital. [facility] is not responsible for any missing/lost belongings or valuables.   Total Shoulder Arthroplasty:  use Benzoyl Peroxide 5% Gel as directed on instruction sheet.  Notify your doctor if there is any change in your medical condition (cold, fever, infection).  Wear comfortable clothing (specific to your surgery type) to the hospital.  After surgery, you can  help prevent lung complications by doing breathing exercises.  Take deep breaths and cough every 1-2 hours. Your doctor may order a device called an Incentive Spirometer to help you take deep breaths. When coughing or sneezing, hold a pillow firmly against your incision with both hands. This is called "splinting." Doing this helps protect your incision. It also decreases belly discomfort.  If you are being admitted to the hospital overnight, leave your suitcase in the car. After surgery it may be brought to your room.  In case of increased patient census, it may be necessary for you, the patient, to continue your postoperative care in the Same Day Surgery department.  If you are being discharged the day of surgery, you will not be allowed to drive home. You will need a responsible individual to drive you home and stay with you for 24 hours after surgery.   If you are taking public transportation, you will need to have a responsible individual with you.  Please call the Pre-admissions Testing Dept. at [phone] if you have any questions about these instructions.  Surgery Visitation Policy:  Patients having surgery or a procedure may have two visitors.  Children under the age of 27 must have an adult with them who is not the patient.  Inpatient Visitation:    Visiting hours are 7 a.m. to 8 p.m. Up to four visitors are allowed at one time in a patient room. The visitors may rotate out with other people during the day.  One visitor age 33 or older may stay with the patient overnight and must be in the room by 8 p.m.    Pre-operative 5 CHG Bath Instructions   You can play a key role in reducing the risk of infection after surgery. Your skin needs to be as free of germs as possible. You can reduce the number of germs on your skin by washing with CHG (chlorhexidine  gluconate) soap before surgery. CHG is an antiseptic soap that kills germs and continues to kill germs even after washing.    DO NOT use if you have an allergy to chlorhexidine /CHG or antibacterial soaps. If your skin becomes reddened or irritated, stop using the CHG and notify one of our RNs at [phone].   Please shower with the CHG soap starting 4 days before surgery using the following schedule:     Please keep in mind the following:  DO NOT shave, including legs and underarms, starting the day of your first shower.   You may shave your face at any point before/day of surgery.  Place clean sheets on your bed the day you start using CHG soap. Use a clean washcloth (not used since being washed) for each shower. DO NOT sleep with pets once you start using the CHG.   CHG Shower Instructions:  If you choose to wash your hair and private area, wash first with your normal shampoo/soap.  After you use shampoo/soap, rinse your hair and body thoroughly to remove shampoo/soap residue.  Turn the water  OFF and apply about 3 tablespoons (45 ml) of CHG soap to a CLEAN washcloth.  Apply CHG soap ONLY  FROM YOUR NECK DOWN TO YOUR TOES (washing for 3-5 minutes)  DO NOT use CHG soap on face, private areas, open wounds, or sores.  Pay special attention to the area where your surgery is being performed.  If you are having back surgery, having someone wash your back for you may be helpful. Wait 2 minutes after CHG soap is applied, then you may rinse off the CHG soap.  Pat dry with a clean towel  Put on clean clothes/pajamas   If you choose to wear lotion, please use ONLY the CHG-compatible lotions on the back of this paper.     Additional instructions for the day of surgery: DO NOT APPLY any lotions, deodorants, cologne, or perfumes.   Put on clean/comfortable clothes.  Brush your teeth.  Ask your nurse before applying any prescription medications to the skin.      CHG Compatible Lotions   Aveeno Moisturizing lotion  Cetaphil Moisturizing Cream  Cetaphil Moisturizing Lotion  Clairol Herbal Essence Moisturizing  Lotion, Dry Skin  Clairol Herbal Essence Moisturizing Lotion, Extra Dry Skin  Clairol Herbal Essence Moisturizing Lotion, Normal Skin  Curel Age Defying Therapeutic Moisturizing Lotion with Alpha Hydroxy  Curel Extreme Care Body Lotion  Curel Soothing Hands Moisturizing Hand Lotion  Curel Therapeutic Moisturizing Cream, Fragrance-Free  Curel Therapeutic Moisturizing Lotion, Fragrance-Free  Curel Therapeutic Moisturizing Lotion, Original Formula  Eucerin Daily Replenishing Lotion  Eucerin Dry Skin Therapy Plus Alpha Hydroxy Crme  Eucerin Dry Skin Therapy Plus Alpha Hydroxy Lotion  Eucerin Original Crme  Eucerin Original Lotion  Eucerin Plus Crme Eucerin Plus Lotion  Eucerin TriLipid Replenishing Lotion  Keri Anti-Bacterial Hand Lotion  Keri Deep Conditioning Original Lotion Dry Skin Formula Softly Scented  Keri Deep Conditioning Original Lotion, Fragrance Free Sensitive Skin Formula  Keri Lotion Fast Absorbing Fragrance Free Sensitive Skin Formula  Keri Lotion Fast Absorbing Softly Scented Dry Skin Formula  Keri Original Lotion  Keri Skin Renewal Lotion Keri Silky Smooth Lotion  Keri Silky Smooth Sensitive Skin Lotion  Nivea Body Creamy Conditioning Oil  Nivea Body Extra Enriched Lotion  Nivea Body Original Lotion  Nivea Body Sheer Moisturizing Lotion Nivea Crme  Nivea Skin Firming Lotion  NutraDerm 30 Skin Lotion  NutraDerm Skin Lotion  NutraDerm Therapeutic Skin Cream  NutraDerm Therapeutic Skin Lotion  ProShield Protective Hand Cream  Provon moisturizing lotion  Preparing for Total Shoulder Arthroplasty  Before surgery, you can play an important role by reducing the number of germs on your skin by using the following products:  Benzoyl Peroxide Gel  o Reduces the number of germs present on the skin  o Applied twice a day to shoulder area starting two days before surgery  Chlorhexidine  Gluconate (CHG) Soap  o An antiseptic cleaner that kills germs and bonds with  the skin to continue killing germs even after washing  o Used for showering the night before surgery and morning of surgery  BENZOYL PEROXIDE 5% GEL  Please do not use if you have an allergy to benzoyl peroxide. If your skin becomes reddened/irritated stop using the benzoyl peroxide.  Starting two days before surgery, apply as follows:  1. Apply benzoyl peroxide in the morning and at night. Apply after taking a shower. If you are not taking a shower, clean entire shoulder front, back, and side along with the armpit with a clean wet washcloth.  2. Place a quarter-sized dollop on your shoulder and rub in thoroughly, making sure to cover the front, back, and side of your shoulder,  along with the armpit.  2 days before ____ AM ____ PM 1 day before ____ AM ____ PM  3. Do this twice a day for two days. (Last application is the night before surgery, AFTER using the CHG soap).  4. Do NOT apply benzoyl peroxide gel on the day of surgery.  How to Use an Incentive Spirometer An incentive spirometer is a tool that measures how well you are filling your lungs with each breath. Learning to take long, deep breaths using this tool can help you keep your lungs clear and active. This may help to reverse or lessen your chance of developing breathing (pulmonary) problems, especially infection. You may be asked to use a spirometer: After a surgery. If you have a lung problem or a history of smoking. After a long period of time when you have been unable to move or be active. If the spirometer includes an indicator to show the highest number that you have reached, your health care provider or respiratory therapist will help you set a goal. Keep a log of your progress as told by your health care provider. What are the risks? Breathing too quickly may cause dizziness or cause you to pass out. Take your time so you do not get dizzy or light-headed. If you are in pain, you may need to take pain medicine before  doing incentive spirometry. It is harder to take a deep breath if you are having pain. How to use your incentive spirometer  Sit up on the edge of your bed or on a chair. Hold the incentive spirometer so that it is in an upright position. Before you use the spirometer, breathe out normally. Place the mouthpiece in your mouth. Make sure your lips are closed tightly around it. Breathe in slowly and as deeply as you can through your mouth, causing the piston or the ball to rise toward the top of the chamber. Hold your breath for 3-5 seconds, or for as long as possible. If the spirometer includes a coach indicator, use this to guide you in breathing. Slow down your breathing if the indicator goes above the marked areas. Remove the mouthpiece from your mouth and breathe out normally. The piston or ball will return to the bottom of the chamber. Rest for a few seconds, then repeat the steps 10 or more times. Take your time and take a few normal breaths between deep breaths so that you do not get dizzy or light-headed. Do this every 1-2 hours when you are awake. If the spirometer includes a goal marker to show the highest number you have reached (best effort), use this as a goal to work toward during each repetition. After each set of 10 deep breaths, cough a few times. This will help to make sure that your lungs are clear. If you have an incision on your chest or abdomen from surgery, place a pillow or a rolled-up towel firmly against the incision when you cough. This can help to reduce pain while taking deep breaths and coughing. General tips When you are able to get out of bed: Walk around often. Continue to take deep breaths and cough in order to clear your lungs. Keep using the incentive spirometer until your health care provider says it is okay to stop using it. If you have been in the hospital, you may be told to keep using the spirometer at home. Contact a health care provider if: You are  having difficulty using the spirometer. You have trouble  using the spirometer as often as instructed. Your pain medicine is not giving enough relief for you to use the spirometer as told. You have a fever. Get help right away if: You develop shortness of breath. You develop a cough with bloody mucus from the lungs. You have fluid or blood coming from an incision site after you cough. Summary An incentive spirometer is a tool that can help you learn to take long, deep breaths to keep your lungs clear and active. You may be asked to use a spirometer after a surgery, if you have a lung problem or a history of smoking, or if you have been inactive for a long period of time. Use your incentive spirometer as instructed every 1-2 hours while you are awake. If you have an incision on your chest or abdomen, place a pillow or a rolled-up towel firmly against your incision when you cough. This will help to reduce pain. Get help right away if you have shortness of breath, you cough up bloody mucus, or blood comes from your incision when you cough. This information is not intended to replace advice given to you by your health care provider. Make sure you discuss any questions you have with your health care provider. Document Revised: 08/13/2023 Document Reviewed: 08/13/2023 Elsevier Patient Education  2024 [employer]  Preoperative Educational Videos for Total Hip, Knee and Shoulder Replacements  To better prepare for surgery, please view our videos that explain the physical activity and discharge planning required to have the best surgical recovery at [facility].  [email or web address]  Questions? Call [phone] or email [email or web address]

## 2024-02-28 ENCOUNTER — Other Ambulatory Visit: Payer: Self-pay | Admitting: Adult Health

## 2024-02-29 ENCOUNTER — Encounter: Payer: Self-pay | Admitting: Surgery

## 2024-02-29 DIAGNOSIS — E119 Type 2 diabetes mellitus without complications: Secondary | ICD-10-CM | POA: Diagnosis not present

## 2024-02-29 NOTE — Progress Notes (Signed)
 Perioperative / Anesthesia Services  Pre-Admission Testing Clinical Review / Pre-Operative Anesthesia Consult  Date: 02/29/24  PATIENT DEMOGRAPHICS: Name: [name] DOB: [birth year] MRN:   [id number]  Note: Available PAT nursing documentation and vital signs have been reviewed. Clinical nursing staff has updated patient's PMH/PSHx, current medication list, and drug allergies/intolerances to ensure complete and comprehensive history available to assist care teams in MDM as it pertains to the aforementioned surgical procedure and anticipated anesthetic course. Extensive review of available clinical information personally performed. [name] PMH and PSHx updated with any diagnoses/procedures that  may have been inadvertently omitted during his intake with the pre-admission testing department's nursing staff.  PLANNED SURGICAL PROCEDURE(S):    Case: [id number] Date/Time: 03/02/24 1012   Procedure: ARTHROPLASTY, SHOULDER, TOTAL, REVERSE (Left: Shoulder)   Anesthesia type: Choice   Diagnosis:      Nontraumatic complete tear of left rotator cuff [M75.122]     Rotator cuff tendinitis, left [M75.82]     Rotator cuff arthropathy, left [M12.812]   Pre-op diagnosis:      Nontraumatic complete tear of left rotator cuff M75.122     Rotator cuff tendinitis, left M75.82     Rotator cuff arthropathy, left M12.812   Location: [facility] OR ROOM 02 / [facility]   Surgeons: Elner Hahn, MD     CLINICAL DISCUSSION: Available PAT nursing documentation and vital signs have been reviewed. Clinical nursing staff has updated patient's PMH/PSHx, current medication list, and drug allergies/intolerances to ensure complete and comprehensive history available to assist care teams in MDM as it pertains to the aforementioned surgical procedure and anticipated anesthetic course. Extensive review of available clinical information personally performed. [name] PMH and PSHx updated with any  diagnoses/procedures that  may have been inadvertently omitted during his intake with the pre-admission testing department's nursing staff.  [name] is a 63 y.o. male who is submitted for pre-surgical anesthesia review and clearance prior to him undergoing the above procedure. Patient is a Current Smoker (63 pack years). Pertinent PMH includes: CAD (s/p CABG), NSTEMI, ischemic cardiomyopathy, HFrEF, atrial fibrillation, pulmonary hypertension, cardiomegaly, aortic atherosclerosis, HTN, HLD, T2DM, CKD, COPD, OSAH (unable to tolerate nocturnal PAP therapy), thrombocytopenia, LEFT rotator cuff tear, peripheral neuropathy, upper extremity paresthesias, RLS, persistent depressive disorder.  Patient is followed by cardiology Derrick Solomons, MD). He was last seen in the cardiology clinic on 08/30/2023; notes reviewed. At the time of his clinic visit, patient doing well overall from a cardiovascular perspective.  Patient with complaints of feeling lightheaded and dizzy, however he had not experienced any presyncopal/syncopal episodes.  Patient reported that he felt like he had been dehydrated due to BP regimen.  Patient denied any chest pain, shortness of breath, PND, orthopnea, palpitations, significant peripheral edema, weakness, or fatigue.. Patient with a past medical history significant for cardiovascular diagnoses. Documented physical exam was grossly benign, providing no evidence of acute exacerbation and/or decompensation of the patient's known cardiovascular conditions.  Patient experienced a NSTEMI on 06/11/2016.  Troponins were trended and peaked at 16.06 ng/mL.  Patient underwent diagnostic LEFT heart catheterization on 06/16/2016 revealing multivessel CAD; 50% ostial-proximal RCA, 50% proximal-mid RCA, 100% ostial ramus intermedius-ramus intermedius, 85% proximal LCx, 100% ostial OM2-OM2, 100% ostial OM4-OM4, 100% ostial OM3-OM3, 70% proximal LAD, 50% mid-distal LAD, 50% distal LAD, and 100% ostial  LPDA-LPDA.  Given the degree and complexity of patient's coronary artery disease, the decision was made to refer patient to CVTS for consideration of revascularization.  Patient underwent  three-vessel revascularization procedure on 08/24/2016.  LIMA-LAD, SVG-D1, and SVG-ramus intermedius bypass grafts were placed.  Patient with a history of known atrial fibrillation that initially occurred in the postoperative setting back in 05/2016.  During his revascularization procedure, LAA ligation was performed.  Most recent myocardial perfusion imaging study was performed on 04/26/2018 revealing a moderately reduced left ventricular systolic function with an EF of 36%.  There was basal inferolateral, basal anterolateral, mid inferolateral, mid anterolateral, apical inferior and apical lateral hypokinesis.  No artifact or left ventricular cavity size enlargement appreciated on review of imaging. SPECT images demonstrated large severe fixed defect involving the lateral wall consistent with scar. TID ratio = 0.95.  Target heart rate was not achieved.  Patient reached 81% MPHR, which degraded sensitivity and specificity of the study.  Overall, study determined to be intermediate risk.  Most recent TTE performed on 07/31/2022 revealed a normal left ventricular systolic function with an EF of 60-65%. There was moderate LVH.  There were no regional wall motion abnormalities.  Left ventricular diastolic Doppler parameters were indeterminant.  There was mild biatrial dilatation.  Right ventricular size and function normal with a TAPSE measuring 2.1 cm  (normal range >/= 1.6 cm).  RVSP = 41.6 mmHg. there was mild mitral annular calcification and calcification of the aortic valve observed.  Mild tricuspid valve regurgitation. All transvalvular gradients were noted to be normal providing no evidence suggestive of valvular stenosis. Aorta normal in size with no evidence of ectasia or aneurysmal dilatation.  Patient with an atrial  fibrillation diagnosis; CHA2DS2-VASc Score = 4 (HFrEF, HTN, prior MI/vascular disease, T2DM). Despite LAA ligation during his CABG procedure, patient with refractory atrial arrhythmia.  He underwent radiofrequency catheter ablation procedure on 12/20/2020, which restored him to normal sinus rhythm.  His rate and rhythm are currently being maintained on oral metoprolol  succinate.  He is not currently on any type of oral anticoagulation.  Ischemic cardiomyopathy and resulting HFrEF being managed on GDMT using diuretic (furosemide ), beta-blocker (metoprolol  succinate), and ARB/ARNI (Entresto ).  Blood pressure documented at 93/54 mmHg.  Patient has a supply of short acting nitrates (NTG) to use on a as needed basis for recurrent angina/anginal equivalent symptoms; denied recent use.  Patient is on atorvastatin  + ezetimibe  for his HLD diagnosis and further ASCVD prevention. T2DM reasonably controlled on currently prescribed regimen; last HgbA1c was 7.3 % when checked on 08/30/2023.  Of note, since patient was last seen by cardiology, his A1c level has been rechecked with further worsening to 8.1% when checked on 12/27/2023.  Due to recurrent urinary tract infections, patient unable to tolerate SGLT2i therapy. Patient is able to complete all of his  ADL/IADLs without cardiovascular limitation.  Per the DAS[facility], patient is able to achieve at least 4 METS of physical activity without experiencing any significant degree of angina/anginal equivalent symptoms. No changes were made to his medication regimen during his visit with cardiology.  Patient scheduled to follow-up with outpatient cardiology in 6 months or sooner if needed.  [name] is scheduled for an elective ARTHROPLASTY, SHOULDER, TOTAL, REVERSE (Left: Shoulder) on 03/02/2024 with Dr. Delayne Feather, MD.  Given patient's past medical history significant for cardiovascular diagnoses, presurgical cardiac clearance was sought by the PAT team.  Per cardiology, "the  patient's RCRI risk factors include ischemic heart disease (history MI, positive stress test, current chest pain, use of nitrates, EKG with Q waves) and history of CHF, therefore the 30-day risk of a cardiac event is 10.1% (2 risk factors).  The patient reports that her functional capacity is moderate to excellent (able to climb 1+ flights of stairs without stopping, able to walk uphill 1-2 blocks).  Based on this assessment, the patient is considered INTERMEDIATE risk for this intermediate risk procedure and is asymptomatic from a cardiac perspective.  He has no absolute contraindications to surgery.  No further preoperative evaluation is recommended".  In review of the patient's chart, it is noted that he is on daily oral antithrombotic therapy. He has been instructed on recommendations for holding his ASA for 7 days prior to his procedure with plans to restart as soon as postoperative bleeding risk felt to be minimized by his attending surgeon. The patient has been instructed that his last dose of ASA should be on 02/23/2024.  Patient reports previous perioperative complications with anesthesia in the past.  Patient advising that he experience (+) tachycardia, rash, and delayed emergence following his CABG procedure.  Additionally, patient reported to have aspirated following cyst removal. In review his EMR, it is noted that patient underwent a general anesthetic course at [facility] ([facility]) in 07/2022 without documented complications.   MOST RECENT VITAL SIGNS:    02/25/2024   12:03 PM 02/25/2024   12:01 PM 02/21/2024   11:03 AM  Vitals with BMI  Height  5\' 5"  5\' 5"   Weight  174 lbs 5 oz 174 lbs 3 oz  BMI  29.01 28.99  Systolic 112  90  Diastolic 78  60  Pulse  75 72   PROVIDERS/SPECIALISTS: NOTE: Primary physician provider listed below. Patient may have been seen by APP or partner within same practice.   PROVIDER ROLE / SPECIALTY LAST OV  Poggi, Kaylene Pascal, MD Orthopedics (Surgeon)  12/31/2023  Dorothe Gaster, NP Primary Care Provider 02/11/2024  Dalbert Dubois, MD Cardiology 08/30/2023  Ray Caffey, MD Psychiatry 02/28/2024  Gelene Kelly, MD Endocrinology 02/22/2024  Devora Folks, MD Neurology 10/26/2023  Myer Artis, MD  Pulmonary Medicine 02/21/2024   ALLERGIES: Allergies  Allergen Reactions   Latex Rash   Lactose Intolerance (Gi)    CURRENT HOME MEDICATIONS: No current facility-administered medications for this encounter.    acetaminophen  (TYLENOL ) 500 MG tablet   albuterol  (VENTOLIN  HFA) 108 (90 Base) MCG/ACT inhaler   aspirin  81 MG chewable tablet   atorvastatin  (LIPITOR ) 80 MG tablet   cetirizine  (ZYRTEC ) 10 MG tablet   citalopram  (CELEXA ) 10 MG tablet   citalopram  (CELEXA ) 20 MG tablet   ezetimibe  (ZETIA ) 10 MG tablet   furosemide  (LASIX ) 20 MG tablet   HUMALOG KWIKPEN 100 UNIT/ML KwikPen   LANTUS  SOLOSTAR 100 UNIT/ML Solostar Pen   metoprolol  succinate (TOPROL -XL) 100 MG 24 hr tablet   nitroGLYCERIN  (NITROSTAT ) 0.4 MG SL tablet   sacubitril -valsartan  (ENTRESTO ) 24-26 MG   sitaGLIPtin (JANUVIA) 100 MG tablet   tirzepatide (MOUNJARO) 2.5 MG/0.5ML Pen   BREZTRI  AEROSPHERE 160-9-4.8 MCG/ACT AERO inhaler   glucose blood test strip   Insulin  Pen Needle 32G X 4 MM MISC   Insulin  Syringe-Needle U-100 (INSULIN  SYRINGE .3CC/31GX5/16") 31G X 5/16" 0.3 ML MISC   Lancets (ONETOUCH ULTRASOFT) lancets   pregabalin  (LYRICA ) 75 MG capsule   rOPINIRole  (REQUIP ) 0.5 MG tablet   HISTORY: Past Medical History:  Diagnosis Date   Aortic atherosclerosis (HCC)    Atrial fibrillation (HCC)    a.) initially occurring in post-op setting in 05/2016, started on Eliquis ; b.) s/p LAA closure at time of CABG 08/24/2016 --> Eliquis  switched to Coumadin ; c.) CHA2DS2VASc = 4 (HFrEF, HTN,  prior MI/vascular disease, T2DM) as of 02/29/2024; d.) s/p RFCA 12/20/2020; e.) rate/rhythm maintained on oral metoprolol  succinate; no OAC at this time   Basal cell carcinoma of  skin    CAD (coronary artery disease)    a. 05/2016: NSTEMI, cath showing 3v disease with CABG recommended once MRSA infection resolved. b. 08/2016: CABG with LIMA-LAD, SVG-D1, and SVG-RI.   Cardiomegaly    CKD (chronic kidney disease)    Complication of anesthesia    tachycardia, rash, delayed emergence after cagb-pt aspirated after cyst removal   COPD (chronic obstructive pulmonary disease) (HCC)    DM (diabetes mellitus), type 2 (HCC)    Essential tremor    Gastroparesis    Headache(784.0)    Hematuria, microscopic "SINCE I WAS A KID"   HFrEF (heart failure with reduced ejection fraction) (HCC)    Hyperlipidemia    Hypertension    Ischemic cardiomyopathy    a. 05/2016: echo w/ EF of 25-35% and akinesis of the basal-midinferolateral and inferior myocardium.   Long-term use of aspirin  therapy    NSTEMI (non-ST elevated myocardial infarction) (HCC) 06/11/2016   Osteomyelitis of left foot (HCC)    Paresthesia and pain of both upper extremities    Peripheral neuropathy    Persistent depressive disorder    Pulmonary hypertension (HCC)    Respiratory failure (HCC)    RLS (restless legs syndrome)    a.) on ropinirole    Rotator cuff tear (BILATERAL)    S/P CABG x 3 08/24/2016   a.) LIMA-LAD, SVG-D1, SVG-RI.   S/P left atrial appendage ligation 08/24/2016   Sleep apnea    a.) unable to tolerate mask required for nocturnal PAP therapy   Thrombocytopenia (HCC)    Tobacco use    Past Surgical History:  Procedure Laterality Date   AMPUTATION TOE Left 02/06/2020   5th ray amputation with graph [facility])   BRONCHIAL BRUSHINGS  08/01/2022   Procedure: BRONCHIAL BRUSHINGS;  Surgeon: Josiah Nigh, MD;  Location: [facility] ENDOSCOPY;  Service: Pulmonary;;   BRONCHIAL WASHINGS  08/01/2022   Procedure: BRONCHIAL WASHINGS;  Surgeon: Josiah Nigh, MD;  Location: [facility] ENDOSCOPY;  Service: Pulmonary;;   CARDIAC CATHETERIZATION N/A 06/16/2016   Procedure: Left Heart Cath and Coronary Angiography;   Surgeon: Millicent Ally, MD;  Location: [facility] INVASIVE CV LAB;  Service: Cardiovascular;  Laterality: N/A;   CLIPPING OF ATRIAL APPENDAGE N/A 08/24/2016   Procedure: CLIPPING OF ATRIAL APPENDAGE;  Surgeon: Heriberto London, MD;  Location: [facility] OR;  Service: Open Heart Surgery;  Laterality: N/A;   COLONOSCOPY WITH PROPOFOL  N/A 02/12/2022   Procedure: COLONOSCOPY WITH PROPOFOL ;  Surgeon: Marnee Sink, MD;  Location[facility] ENDOSCOPY;  Service: Endoscopy;  Laterality: N/A;   CORONARY ARTERY BYPASS GRAFT N/A 08/24/2016   Procedure: CORONARY ARTERY BYPASS GRAFTING (CABG) x three,  using left internal mammary artery and right leg greater saphenous vein harvested endoscopically;  Surgeon: Heriberto London, MD;  Location: [facility] OR;  Service: Open Heart Surgery;  Laterality: N/A;   HERNIA REPAIR  X2   INCISION AND DRAINAGE ABSCESS Left 06/10/2016   Procedure: INCISION AND DRAINAGE LEFT AXILLARY ABSCESS;  Surgeon: Lillette Reid III, MD;  Location: [facility] ORS;  Service: General;  Laterality: Left;   ROTATOR CUFF REPAIR Bilateral    TEE WITHOUT CARDIOVERSION N/A 08/24/2016   Procedure: TRANSESOPHAGEAL ECHOCARDIOGRAM (TEE);  Surgeon: Heriberto London, MD;  Location: [facility] OR;  Service: Open Heart Surgery;  Laterality: N/A;   VIDEO BRONCHOSCOPY Right 08/01/2022   Procedure:  VIDEO BRONCHOSCOPY WITH FLUORO;  Surgeon: Josiah Nigh, MD;  Location: [facility] ENDOSCOPY;  Service: Pulmonary;  Laterality: Right;   Family History  Problem Relation Age of Onset   Hypertension Mother    Breast cancer Mother    Hypertension Father    Sudden death Father 63       at age of death   Diabetes Father    Heart attack Father    Heart attack Sister    Cancer Maternal Aunt        breast x2 aunts   Social History   Tobacco Use   Smoking status: Every Day    Current packs/day: 1.50    Average packs/day: 1.5 packs/day for 42.0 years (63.0 ttl pk-yrs)    Types: Cigarettes   Smokeless tobacco: Never   Tobacco comments:    Currently 1.5 pk a day  02/21/2024 hfb RN  Substance Use Topics   Alcohol use: No   LABS:  Hospital Outpatient Visit on 02/25/2024  Component Date Value Ref Range Status   MRSA, PCR 02/25/2024 NEGATIVE  NEGATIVE Final   Staphylococcus aureus 02/25/2024 NEGATIVE  NEGATIVE Final   Comment: (NOTE) The Xpert SA Assay (FDA approved for NASAL specimens in patients 38 years of age and older), is one component of a comprehensive surveillance program. It is not intended to diagnose infection nor to guide or monitor treatment. Performed at [facility], [address] [place], [place] [address]    Color, Urine 02/25/2024 YELLOW (A)  YELLOW Final   APPearance 02/25/2024 CLEAR (A)  CLEAR Final   Specific Gravity, Urine 02/25/2024 1.015  1.005 - 1.030 Final   pH 02/25/2024 5.0  5.0 - 8.0 Final   Glucose, UA 02/25/2024 NEGATIVE  NEGATIVE mg/dL Final   Hgb urine dipstick 02/25/2024 SMALL (A)  NEGATIVE Final   Bilirubin Urine 02/25/2024 NEGATIVE  NEGATIVE Final   Ketones, ur 02/25/2024 NEGATIVE  NEGATIVE mg/dL Final   Protein, ur 95/62/1308 100 (A)  NEGATIVE mg/dL Final   Nitrite 65/78/4696 NEGATIVE  NEGATIVE Final   Leukocytes,Ua 02/25/2024 NEGATIVE  NEGATIVE Final   RBC / HPF 02/25/2024 0-5  0 - 5 RBC/hpf Final   WBC, UA 02/25/2024 0-5  0 - 5 WBC/hpf Final   Bacteria, UA 02/25/2024 RARE (A)  NONE SEEN Final   Squamous Epithelial / HPF 02/25/2024 0  0 - 5 /HPF Final   Performed at [facility], [address] [place], [place] [address]  Office Visit on 02/11/2024  Component Date Value Ref Range Status   WBC 02/11/2024 9.1  4.0 - 10.5 K/uL Final   RBC 02/11/2024 5.26  4.22 - 5.81 Mil/uL Final   Platelets 02/11/2024 120.0 (L)  150.0 - 400.0 K/uL Final   Hemoglobin 02/11/2024 15.6  13.0 - 17.0 g/dL Final   HCT 41/32/4401 47.3  39.0 - 52.0 % Final   MCV 02/11/2024 89.9  78.0 - 100.0 fl Final   MCHC 02/11/2024 33.0  30.0 - 36.0 g/dL Final   RDW 02/72/5366 14.4  11.5 - 15.5 % Final   Sodium  02/11/2024 142  135 - 145 mEq/L Final   Potassium 02/11/2024 4.1  3.5 - 5.1 mEq/L Final   Chloride 02/11/2024 104  96 - 112 mEq/L Final   CO2 02/11/2024 30  19 - 32 mEq/L Final   Glucose, Bld 02/11/2024 132 (H)  70 - 99 mg/dL Final   BUN 44/12/4740 27 (H)  6 - 23 mg/dL Final   Creatinine, Ser 02/11/2024 1.63 (  H)  0.40 - 1.50 mg/dL Final   GFR 16/07/9603 44.83 (L)  >60.00 mL/min Final   Calculated using the CKD-EPI Creatinine Equation (2021)   Calcium  02/11/2024 8.6  8.4 - 10.5 mg/dL Final   Lipase 54/06/8118 98.0 (H)  11.0 - 59.0 U/L Final    ECG: Date: 02/25/2024  Time ECG obtained: 1230 PM Rate: 72 bpm Rhythm: normal sinus Axis (leads I and aVF): normal Intervals: PR 200 ms. QRS 100 ms. QTc 470 ms. ST segment and T wave changes: Inferior T wave inversions Evidence of a possible, age undetermined, prior infarct:  Yes; inferior Comparison: Similar to previous tracing obtained on 11/10/2022   IMAGING / PROCEDURES: TRANSTHORACIC ECHOCARDIOGRAM performed on 07/31/2022 Left ventricular ejection fraction, by estimation, is 60 to 65%. The left ventricle has normal function. The left ventricle has no regional wall motion abnormalities. There is moderate left ventricular hypertrophy. Left ventricular diastolic parameters are indeterminate.  Right ventricular systolic function is normal. The right ventricular size is normal. There is mildly elevated pulmonary artery systolic pressure.  Left atrial size was mildly dilated.  Right atrial size was mildly dilated.  The mitral valve is normal in structure. No evidence of mitral valve regurgitation. Moderate mitral annular calcification.  The aortic valve is tricuspid. There is mild calcification of the aortic valve. Aortic valve regurgitation is not visualized.  The inferior vena cava is dilated in size with >50% respiratory variability, suggesting right atrial pressure of 8 mmHg.   VAS US  ABI WITH/WO TBI performed on 08/27/2021 Resting right  ankle-brachial index is within normal range. No evidence of significant right lower extremity arterial disease. The right toe-brachial index is normal.  Resting left ankle-brachial index is within normal range. No evidence of significant left lower extremity arterial disease. The left toe-brachial index is normal.   MYOCARDIAL PERFUSION IMAGING STUDY (LEXISCAN) performed on 04/26/2018 The left ventricular ejection fraction is moderately decreased (36%) with anterolateral/inferolateral hypokinesis. Target heart rate was not achieved (patient reached 81% MPHR), which degrades sensitivity and specificity of this study. There is a large in size, severe, fixed defect involving the lateral wall consistent with scar. No significant ischemia is identified. Abnormal myocardial perfusion stress test. This is an intermediate risk study.  CORONARY ARTERY BYPASS GRAFTING performed on 08/24/2016 3 vessel revascularization LIMA-LAD SVG-D1 SVG-RI  LEFT HEART CATHETERIZATION AND CORONARY ANGIOGRAPHY performed on 06/16/2016 Prior echocardiographic documentation of an ejection fraction of 25-30%. LVEDP 16 mmHg Severe multivessel CAD  Ost RCA to Prox RCA lesion, 50% stenosed. Prox RCA to Mid RCA lesion, 50% stenosed. Ost Ramus to Ramus lesion, 100% stenosed. Prox Cx lesion, 85% stenosed. Ost 2nd Mrg to 2nd Mrg lesion, 100% stenosed. Ost 4th Mrg to 4th Mrg lesion, 100% stenosed. Ost 3rd Mrg to 3rd Mrg lesion, 100% stenosed. Prox LAD lesion, 70% stenosed. Mid LAD to Dist LAD lesion, 50% stenosed. Dist LAD lesion, 50% stenosed. Ost LPDA to LPDA lesion, 100% stenosed There appears to be significant thrombus present in the circumflex territory. Heparin  will be resumed. Surgical consultation will be obtained for consideration of CABG revascularization.      IMPRESSION AND PLAN: [name] has been referred for pre-anesthesia review and clearance prior to him undergoing the planned anesthetic and  procedural courses. Available labs, pertinent testing, and imaging results were personally reviewed by me in preparation for upcoming operative/procedural course. [facility] medical record has been updated following extensive record review and patient interview with PAT staff.   This patient has been appropriately cleared  by cardiology with an overall MODERATE risk of patient experiencing significant perioperative cardiovascular complications. Based on clinical review performed today (02/29/24), barring any significant acute changes in the patient's overall condition, it is anticipated that he will be able to proceed with the planned surgical intervention. Any acute changes in clinical condition may necessitate his procedure being postponed and/or cancelled. Patient will meet with anesthesia team (MD and/or CRNA) on the day of his procedure for preoperative evaluation/assessment. Questions regarding anesthetic course will be fielded at that time.   Pre-surgical instructions were reviewed with the patient during his PAT appointment, and questions were fielded to satisfaction by PAT clinical staff. He has been instructed on which medications that he will need to hold prior to surgery, as well as the ones that have been deemed safe/appropriate to take on the day of his procedure. As part of the general education provided by PAT, patient made aware both verbally and in writing, that he would need to abstain from the use of any illegal substances during his perioperative course. He was advised that failure to follow the provided instructions could necessitate case cancellation or result in serious perioperative complications up to and including death. Patient encouraged to contact PAT and/or his surgeon's office to discuss any questions or concerns that may arise prior to surgery; verbalized understanding.   Renate Caroline, MSN, APRN, FNP-C, CEN [facility]  Perioperative Services Nurse  Practitioner Phone: [phone] Fax: [phone] 02/29/24 9:06 AM  NOTE: This note has been prepared using Dragon dictation software. Despite my best ability to proofread, there is always the potential that unintentional transcriptional errors may still occur from this process.

## 2024-03-01 ENCOUNTER — Encounter: Payer: Self-pay | Admitting: Surgery

## 2024-03-01 NOTE — H&P (Signed)
 History of Present Illness: [name] is a 63 y.o. male who presents today for his surgical history and physical for upcoming left reverse total shoulder arthroplasty scheduled with Dr. Daun Epstein on 03/02/2024. The patient denies any changes in his medical history since he was last evaluated. He reports a 2 out of 10 pain score in the left shoulder. He is taking Tylenol  as needed for discomfort. The patient does have a history of CABG and does take baby aspirin  daily. He does have a history of ablation in the setting of atrial fibrillation. The patient does have a history of COPD, he does have a inhaler that he uses routinely. The patient is a diabetic on insulin  most recent A1c was 7.3. The patient denies any numbness or tingling to the left upper extremity at today's visit.  Past Medical History: Diabetes mellitus type 2 with complications (CMS/HHS-HCC)  Heart attack (CMS/HHS-HCC)  Hyperlipidemia associated with type 2 diabetes mellitus (CMS/HHS-HCC)  Hypertension   Past Surgical History: ARTHROSCOPIC ROTATOR CUFF REPAIR  INCISION & DRAINAGE LYMPH NODE ABCESS  REPAIR HIATAL HERNIA  skin cancer  triple bypass   Past Family History: Family History  Problem Relation Age of Onset  Diabetes type II Mother  High blood pressure (Hypertension) Mother  Skin cancer Mother  Diabetes type II Father  Heart disease Father  Myocardial Infarction (Heart attack) Father  High blood pressure (Hypertension) Father  Kidney disease Father   Medications: MOUNJARO 2.5 mg/0.5 mL pen injector  acetaminophen  (TYLENOL ) 500 MG tablet Take 1,000 mg by mouth as needed  albuterol  MDI, PROVENTIL , VENTOLIN , PROAIR , HFA 90 mcg/actuation inhaler Inhale 2 inhalations into the lungs every 6 (six) hours as needed for Shortness of Breath or Wheezing  amoxicillin -clavulanate (AUGMENTIN ) 875-125 mg tablet Take 1 tablet by mouth 2 (two) times daily  aspirin  81 MG chewable tablet Take by mouth  atorvastatin  (LIPITOR ) 80 MG  tablet Take 80 mg by mouth once daily  azelastine  (ASTELIN ) 137 mcg nasal spray Place 2 sprays into both nostrils 2 (two) times daily  BD ULTRA-FINE NANO PEN NEEDLE 32 gauge x 5/32" Ndle Use to inject Insulin  daily. Dx: E11.9  blood glucose diagnostic test strip USE UP TO 4 TIMES DAILY AS DIRECTED  BREZTRI  AEROSPHERE 160-9-4.8 mcg/actuation inhaler Inhale 2 inhalations into the lungs 2 (two) times daily  cetirizine  (ZYRTEC ) 10 MG tablet Take 10 mg by mouth once daily  citalopram  (CELEXA ) 10 MG tablet Take 10 mg by mouth once daily  citalopram  (CELEXA ) 20 MG tablet Take 20 mg by mouth once daily  citalopram  hydrobromide (CELEXA  ORAL) Take 1 tablet by mouth once daily Patient states he takes 30 mg  doxycycline  (VIBRAMYCIN ) 100 MG capsule Take 100 mg by mouth 2 (two) times daily  ezetimibe  (ZETIA ) 10 mg tablet Take 10 mg by mouth once daily  ezetimibe  (ZETIA ) 10 mg tablet Take 1 tablet by mouth once daily  FUROsemide  (LASIX ) 20 MG tablet Take 20 mg by mouth every other day  gabapentin  (NEURONTIN ) 300 MG capsule Take 2 capsules by mouth at bedtime  insulin  LISPRO (HUMALOG KWIKPEN) pen injector (concentration 100 units/mL) USE UP TO 50 UNITS DAILY IN DIVIDED DOSES AS PER MD  insulin  NPH-REGULAR (HUMULIN 70/30 PEN) 100 unit/mL (70-30) pen injector INJECT 50 UNITS SUBCUTANEOUSLY EVERY MORNING AND 65 UNITS WITH DINNER  LANTUS  SOLOSTAR U-100 INSULIN  pen injector (concentration 100 units/mL) Inject subcutaneously once daily  losartan (COZAAR) 25 MG tablet  metoprolol  succinate (TOPROL -XL) 100 MG XL tablet Take 100 mg by mouth  once daily  nitroGLYcerin  (NITROSTAT ) 0.4 MG SL tablet Place 0.4 mg under the tongue every 5 (five) minutes as needed  rOPINIRole  (REQUIP ) 0.25 MG immediate release tablet TAKE 1 TABLET BY MOUTH AT BEDTIME (TAKE 30 MINUTES PRIOR TO USUAL SYMPTOM ONSET TIME) 30 tablet 3  sacubitriL -valsartan  (ENTRESTO ) 24-26 mg tablet Take 0.5 tablets by mouth once daily  SITagliptin phosphate  (JANUVIA) 100 MG tablet Take 1 tablet by mouth once daily  tiotropium-olodateroL (STIOLTO RESPIMAT ) 2.5-2.5 mcg/actuation inhaler INHALE 2 PUFFS BY MOUTH ONCE DAILY  tiZANidine  (ZANAFLEX ) 2 MG tablet Take 1 tablet by mouth at bedtime  transparent dressings (TEGADERM) 3 1/2 X 4 " Bndg Please use on top of the dexcom sensor to improve staying on.   Allergies: Latex Rash   Review of Systems:  A comprehensive 14 point ROS was performed, reviewed by me today, and the pertinent orthopaedic findings are documented in the HPI.  Physical Exam: BP 126/74  Ht 165.1 cm (5\' 5" )  Wt 77.4 kg (170 lb 9.6 oz)  BMI 28.39 kg/m  General/Constitutional: The patient appears to be well-nourished, well-developed, and in no acute distress. Neuro/Psych: Normal mood and affect, oriented to person, place and time. Eyes: Non-icteric. Pupils are equal, round, and reactive to light, and exhibit synchronous movement. ENT: Unremarkable. Lymphatic: No palpable adenopathy. Respiratory: Lungs clear to auscultation, Normal chest excursion, No wheezes, and Non-labored breathing Cardiovascular: Regular rate and rhythm. No murmurs. and No edema, swelling or tenderness, except as noted in detailed exam. Integumentary: No impressive skin lesions present, except as noted in detailed exam. Musculoskeletal: Unremarkable, except as noted in detailed exam.  Left shoulder exam: SKIN: Well-healed surgical incisions, otherwise unremarkable SWELLING: none WARMTH: none LYMPH NODES: no adenopathy palpable CREPITUS: none TENDERNESS: Mildly tender over anterolateral shoulder ROM (active):  Forward flexion: 165 degrees Abduction: 160 degrees Internal rotation: L4 ROM (passive):  Forward flexion: 170 degrees Abduction: 165 degrees  ER/IR at 90 abd: 90 degrees/65 degrees  He has moderate pain with all motions.  STRENGTH: Forward flexion: 4/5 Abduction: 3+-4/5 External rotation: 4/5 Internal rotation: 4-4+/5 Pain with RC  testing: Mild-moderate pain with resisted abduction more so than with resisted forward flexion  STABILITY: Normal  SPECIAL TESTS: Zoila Hines' test: positive, mild-moderate Speed's test: negative Capsulitis - pain w/ passive ER: no Crossed arm test: Mildly positive Crank: Not evaluated Anterior apprehension: Negative Posterior apprehension: Not evaluated  He is neurovascularly intact to the left upper extremity.  Imaging: True AP, Y-scapular, and axillary views of the left shoulder are obtained. These films demonstrate mild degenerative changes of the glenohumeral joint as manifest by perhaps mild joint space narrowing and a small inferior humeral osteophyte. The subacromial space is mildly decreased. There is no subacromial or infra-clavicular spurring. He demonstrates a Type II acromion.  Left Shoulder Imaging, MRI: MRI Shoulder Cartilage: Partial thickness humeral head cartilage loss. Partial thickness glenoid cartilage loss. MRI Shoulder Rotator Cuff: Chronic full-thickness tears of the supraspinatus, infraspinatus, and superior portion of the subscapularis tendons. Retracted to the glenohumeral joint. MRI Shoulder Labrum / Biceps: Biceps tendinopathy. MRI Shoulder Bone: Normal bone.  Impression: 1. Nontraumatic complete tear of left rotator cuff. 2. Rotator cuff arthropathy, left 3. Type 2 diabetes mellitus with hyperglycemia, with long-term current use of insulin  (CMS/HHS-HCC)  Plan:  1. Treatment options were discussed today with the patient. 2. The patient is scheduled for a left reverse total shoulder arthroplasty with Dr. Daun Epstein on 03/02/2024. 3. The patient was instructed on the risk and benefits of surgical intervention  and wishes to proceed at this time. 4. This document will serve as a surgical history and physical for the patient. 5. The patient will follow-up per standard postop protocol. They can call the clinic they have any questions, new symptoms develop or symptoms  worsen.  The procedure was discussed with the patient, as were the potential risks (including bleeding, infection, nerve and/or blood vessel injury, persistent or recurrent pain, failure of the hardware, dislocation, axillary nerve injury, stiffness, stress fracture, need for further surgery, blood clots, strokes, heart attacks and/or arhythmias, pneumonia, etc.) and benefits. The patient states his understanding and wishes to proceed.    H&P reviewed and patient re-examined. No changes.

## 2024-03-02 ENCOUNTER — Ambulatory Visit

## 2024-03-02 ENCOUNTER — Other Ambulatory Visit: Payer: Self-pay

## 2024-03-02 ENCOUNTER — Encounter: Payer: Self-pay | Admitting: Surgery

## 2024-03-02 ENCOUNTER — Ambulatory Visit: Admitting: Urgent Care

## 2024-03-02 ENCOUNTER — Ambulatory Visit
Admission: RE | Admit: 2024-03-02 | Discharge: 2024-03-02 | Disposition: A | Discharge status: Home / Self Care | Attending: Surgery | Admitting: Surgery

## 2024-03-02 ENCOUNTER — Encounter
Admission: RE | Disposition: A | Discharge status: Home / Self Care | Payer: Self-pay | Source: Home / Self Care | Attending: Surgery

## 2024-03-02 DIAGNOSIS — G4733 Obstructive sleep apnea (adult) (pediatric): Secondary | ICD-10-CM | POA: Insufficient documentation

## 2024-03-02 DIAGNOSIS — E785 Hyperlipidemia, unspecified: Secondary | ICD-10-CM | POA: Insufficient documentation

## 2024-03-02 DIAGNOSIS — I130 Hypertensive heart and chronic kidney disease with heart failure and stage 1 through stage 4 chronic kidney disease, or unspecified chronic kidney disease: Secondary | ICD-10-CM | POA: Diagnosis not present

## 2024-03-02 DIAGNOSIS — I700 Atherosclerosis of aorta: Secondary | ICD-10-CM | POA: Diagnosis not present

## 2024-03-02 DIAGNOSIS — M7582 Other shoulder lesions, left shoulder: Secondary | ICD-10-CM | POA: Diagnosis not present

## 2024-03-02 DIAGNOSIS — Z96612 Presence of left artificial shoulder joint: Secondary | ICD-10-CM | POA: Diagnosis not present

## 2024-03-02 DIAGNOSIS — R202 Paresthesia of skin: Secondary | ICD-10-CM | POA: Insufficient documentation

## 2024-03-02 DIAGNOSIS — E1169 Type 2 diabetes mellitus with other specified complication: Secondary | ICD-10-CM | POA: Diagnosis not present

## 2024-03-02 DIAGNOSIS — E1143 Type 2 diabetes mellitus with diabetic autonomic (poly)neuropathy: Secondary | ICD-10-CM | POA: Insufficient documentation

## 2024-03-02 DIAGNOSIS — M129 Arthropathy, unspecified: Secondary | ICD-10-CM | POA: Diagnosis not present

## 2024-03-02 DIAGNOSIS — Z7985 Long-term (current) use of injectable non-insulin antidiabetic drugs: Secondary | ICD-10-CM | POA: Insufficient documentation

## 2024-03-02 DIAGNOSIS — J449 Chronic obstructive pulmonary disease, unspecified: Secondary | ICD-10-CM | POA: Insufficient documentation

## 2024-03-02 DIAGNOSIS — F341 Dysthymic disorder: Secondary | ICD-10-CM | POA: Insufficient documentation

## 2024-03-02 DIAGNOSIS — N189 Chronic kidney disease, unspecified: Secondary | ICD-10-CM | POA: Insufficient documentation

## 2024-03-02 DIAGNOSIS — Z794 Long term (current) use of insulin: Secondary | ICD-10-CM | POA: Insufficient documentation

## 2024-03-02 DIAGNOSIS — I255 Ischemic cardiomyopathy: Secondary | ICD-10-CM | POA: Insufficient documentation

## 2024-03-02 DIAGNOSIS — E1142 Type 2 diabetes mellitus with diabetic polyneuropathy: Secondary | ICD-10-CM | POA: Diagnosis not present

## 2024-03-02 DIAGNOSIS — Z7982 Long term (current) use of aspirin: Secondary | ICD-10-CM | POA: Insufficient documentation

## 2024-03-02 DIAGNOSIS — E1122 Type 2 diabetes mellitus with diabetic chronic kidney disease: Secondary | ICD-10-CM | POA: Diagnosis not present

## 2024-03-02 DIAGNOSIS — Z01810 Encounter for preprocedural cardiovascular examination: Secondary | ICD-10-CM

## 2024-03-02 DIAGNOSIS — E1165 Type 2 diabetes mellitus with hyperglycemia: Secondary | ICD-10-CM | POA: Diagnosis not present

## 2024-03-02 DIAGNOSIS — Z951 Presence of aortocoronary bypass graft: Secondary | ICD-10-CM | POA: Insufficient documentation

## 2024-03-02 DIAGNOSIS — I252 Old myocardial infarction: Secondary | ICD-10-CM | POA: Diagnosis not present

## 2024-03-02 DIAGNOSIS — I272 Pulmonary hypertension, unspecified: Secondary | ICD-10-CM | POA: Insufficient documentation

## 2024-03-02 DIAGNOSIS — F17210 Nicotine dependence, cigarettes, uncomplicated: Secondary | ICD-10-CM | POA: Insufficient documentation

## 2024-03-02 DIAGNOSIS — G2581 Restless legs syndrome: Secondary | ICD-10-CM | POA: Diagnosis not present

## 2024-03-02 DIAGNOSIS — M12812 Other specific arthropathies, not elsewhere classified, left shoulder: Secondary | ICD-10-CM | POA: Diagnosis not present

## 2024-03-02 DIAGNOSIS — I480 Paroxysmal atrial fibrillation: Secondary | ICD-10-CM | POA: Diagnosis not present

## 2024-03-02 DIAGNOSIS — I4891 Unspecified atrial fibrillation: Secondary | ICD-10-CM | POA: Insufficient documentation

## 2024-03-02 DIAGNOSIS — I2510 Atherosclerotic heart disease of native coronary artery without angina pectoris: Secondary | ICD-10-CM | POA: Diagnosis not present

## 2024-03-02 DIAGNOSIS — A419 Sepsis, unspecified organism: Secondary | ICD-10-CM | POA: Diagnosis not present

## 2024-03-02 DIAGNOSIS — G8918 Other acute postprocedural pain: Secondary | ICD-10-CM | POA: Diagnosis not present

## 2024-03-02 DIAGNOSIS — I5022 Chronic systolic (congestive) heart failure: Secondary | ICD-10-CM | POA: Diagnosis not present

## 2024-03-02 DIAGNOSIS — Z7984 Long term (current) use of oral hypoglycemic drugs: Secondary | ICD-10-CM | POA: Insufficient documentation

## 2024-03-02 DIAGNOSIS — M75122 Complete rotator cuff tear or rupture of left shoulder, not specified as traumatic: Secondary | ICD-10-CM | POA: Diagnosis not present

## 2024-03-02 DIAGNOSIS — Z01818 Encounter for other preprocedural examination: Secondary | ICD-10-CM

## 2024-03-02 DIAGNOSIS — K3184 Gastroparesis: Secondary | ICD-10-CM | POA: Diagnosis not present

## 2024-03-02 DIAGNOSIS — Z87440 Personal history of urinary (tract) infections: Secondary | ICD-10-CM | POA: Insufficient documentation

## 2024-03-02 DIAGNOSIS — Z79899 Other long term (current) drug therapy: Secondary | ICD-10-CM | POA: Insufficient documentation

## 2024-03-02 HISTORY — DX: Long term (current) use of aspirin: Z79.82

## 2024-03-02 HISTORY — DX: Pulmonary hypertension, unspecified: I27.20

## 2024-03-02 HISTORY — DX: Atherosclerosis of aorta: I70.0

## 2024-03-02 HISTORY — DX: Basal cell carcinoma of skin, unspecified: C44.91

## 2024-03-02 HISTORY — DX: Dysthymic disorder: F34.1

## 2024-03-02 HISTORY — DX: Osteomyelitis, unspecified: M86.9

## 2024-03-02 HISTORY — DX: Unspecified systolic (congestive) heart failure: I50.20

## 2024-03-02 HISTORY — DX: Cardiomegaly: I51.7

## 2024-03-02 HISTORY — DX: Essential tremor: G25.0

## 2024-03-02 HISTORY — DX: Pain in right arm: M79.601

## 2024-03-02 HISTORY — PX: REVERSE SHOULDER ARTHROPLASTY: SHX5054

## 2024-03-02 LAB — GLUCOSE, CAPILLARY
Glucose-Capillary: 173 mg/dL — ABNORMAL HIGH (ref 70–99)
Glucose-Capillary: 181 mg/dL — ABNORMAL HIGH (ref 70–99)
Glucose-Capillary: 476 mg/dL — ABNORMAL HIGH (ref 70–99)

## 2024-03-02 SURGERY — ARTHROPLASTY, SHOULDER, TOTAL, REVERSE
Anesthesia: Regional | Site: Shoulder | Laterality: Left

## 2024-03-02 MED ORDER — BUPIVACAINE HCL (PF) 0.5 % IJ SOLN
INTRAMUSCULAR | Status: DC | PRN
  Administered 2024-03-02: 10 mL

## 2024-03-02 MED ORDER — MIDAZOLAM HCL 2 MG/2ML IJ SOLN
INTRAMUSCULAR | Status: AC
Start: 2024-03-02 — End: ?
  Filled 2024-03-02: qty 2

## 2024-03-02 MED ORDER — OXYCODONE HCL 5 MG PO TABS: 5.0000 mg | ORAL_TABLET | ORAL | 0 refills | Status: AC | PRN

## 2024-03-02 MED ORDER — CHLORHEXIDINE GLUCONATE 0.12 % MT SOLN
OROMUCOSAL | Status: AC
  Filled 2024-03-02: qty 15

## 2024-03-02 MED ORDER — PHENYLEPHRINE 80 MCG/ML (10ML) SYRINGE FOR IV PUSH (FOR BLOOD PRESSURE SUPPORT)
PREFILLED_SYRINGE | INTRAVENOUS | Status: DC | PRN
  Administered 2024-03-02 (×4): 160 ug via INTRAVENOUS
  Administered 2024-03-02: 80 ug via INTRAVENOUS
  Administered 2024-03-02: 160 ug via INTRAVENOUS
  Administered 2024-03-02: 80 ug via INTRAVENOUS

## 2024-03-02 MED ORDER — OXYCODONE HCL 5 MG PO TABS: 5.0000 mg | ORAL_TABLET | Freq: Once | ORAL | Status: DC | PRN

## 2024-03-02 MED ORDER — SUGAMMADEX SODIUM 200 MG/2ML IV SOLN
INTRAVENOUS | Status: DC | PRN
  Administered 2024-03-02: 200 mg via INTRAVENOUS

## 2024-03-02 MED ORDER — LIDOCAINE HCL (CARDIAC) PF 100 MG/5ML IV SOSY
PREFILLED_SYRINGE | INTRAVENOUS | Status: DC | PRN
  Administered 2024-03-02: 100 mg via INTRAVENOUS

## 2024-03-02 MED ORDER — MIDAZOLAM HCL 2 MG/2ML IJ SOLN
1.0000 mg | INTRAMUSCULAR | Status: AC | PRN
  Administered 2024-03-02 (×2): 1 mg via INTRAVENOUS

## 2024-03-02 MED ORDER — CEFAZOLIN SODIUM-DEXTROSE 2-4 GM/100ML-% IV SOLN
2.0000 g | INTRAVENOUS | Status: AC
Start: 2024-03-02 — End: 2024-03-02
  Administered 2024-03-02: 2 g via INTRAVENOUS

## 2024-03-02 MED ORDER — ROCURONIUM BROMIDE 10 MG/ML (PF) SYRINGE
PREFILLED_SYRINGE | INTRAVENOUS | Status: AC
  Filled 2024-03-02: qty 10

## 2024-03-02 MED ORDER — ONDANSETRON HCL 4 MG/2ML IJ SOLN
INTRAMUSCULAR | Status: AC
  Filled 2024-03-02: qty 2

## 2024-03-02 MED ORDER — BUPIVACAINE LIPOSOME 1.3 % IJ SUSP
INTRAMUSCULAR | Status: AC
  Filled 2024-03-02: qty 20

## 2024-03-02 MED ORDER — LIDOCAINE HCL (PF) 2 % IJ SOLN
INTRAMUSCULAR | Status: AC
Start: 2024-03-02 — End: ?
  Filled 2024-03-02: qty 5

## 2024-03-02 MED ORDER — ONDANSETRON HCL 4 MG/2ML IJ SOLN: 4.0000 mg | Freq: Once | INTRAMUSCULAR | Status: DC | PRN

## 2024-03-02 MED ORDER — ORAL CARE MOUTH RINSE: 15.0000 mL | Freq: Once | OROMUCOSAL | Status: AC

## 2024-03-02 MED ORDER — CEFAZOLIN SODIUM-DEXTROSE 2-4 GM/100ML-% IV SOLN
2.0000 g | Freq: Four times a day (QID) | INTRAVENOUS | Status: DC
  Administered 2024-03-02: 2 g via INTRAVENOUS

## 2024-03-02 MED ORDER — BUPIVACAINE HCL (PF) 0.5 % IJ SOLN
INTRAMUSCULAR | Status: AC
  Filled 2024-03-02: qty 10

## 2024-03-02 MED ORDER — BUPIVACAINE-EPINEPHRINE (PF) 0.5% -1:200000 IJ SOLN
INTRAMUSCULAR | Status: DC | PRN
  Administered 2024-03-02: 30 mL via PERINEURAL

## 2024-03-02 MED ORDER — ALBUTEROL SULFATE HFA 108 (90 BASE) MCG/ACT IN AERS
INHALATION_SPRAY | RESPIRATORY_TRACT | Status: AC
  Filled 2024-03-02: qty 6.7

## 2024-03-02 MED ORDER — PHENYLEPHRINE 80 MCG/ML (10ML) SYRINGE FOR IV PUSH (FOR BLOOD PRESSURE SUPPORT)
PREFILLED_SYRINGE | INTRAVENOUS | Status: AC
  Filled 2024-03-02: qty 10

## 2024-03-02 MED ORDER — BUPIVACAINE LIPOSOME 1.3 % IJ SUSP
INTRAMUSCULAR | Status: DC | PRN
  Administered 2024-03-02: 20 mL

## 2024-03-02 MED ORDER — DEXAMETHASONE SODIUM PHOSPHATE 10 MG/ML IJ SOLN
INTRAMUSCULAR | Status: DC | PRN
Start: 2024-03-02 — End: 2024-03-02
  Administered 2024-03-02: 10 mg via INTRAVENOUS

## 2024-03-02 MED ORDER — DEXMEDETOMIDINE HCL IN NACL 80 MCG/20ML IV SOLN
INTRAVENOUS | Status: DC | PRN
  Administered 2024-03-02: 8 ug via INTRAVENOUS

## 2024-03-02 MED ORDER — CETIRIZINE HCL 10 MG PO TABS: 10.0000 mg | ORAL_TABLET | ORAL | Status: DC

## 2024-03-02 MED ORDER — INSULIN ASPART 100 UNIT/ML IJ SOLN
INTRAMUSCULAR | Status: AC
  Filled 2024-03-02: qty 1

## 2024-03-02 MED ORDER — DEXAMETHASONE SODIUM PHOSPHATE 10 MG/ML IJ SOLN
INTRAMUSCULAR | Status: AC
  Filled 2024-03-02: qty 1

## 2024-03-02 MED ORDER — ROCURONIUM BROMIDE 100 MG/10ML IV SOLN
INTRAVENOUS | Status: DC | PRN
Start: 2024-03-02 — End: 2024-03-02
  Administered 2024-03-02: 50 mg via INTRAVENOUS

## 2024-03-02 MED ORDER — SUCCINYLCHOLINE CHLORIDE 200 MG/10ML IV SOSY
PREFILLED_SYRINGE | INTRAVENOUS | Status: DC | PRN
  Administered 2024-03-02: 100 mg via INTRAVENOUS

## 2024-03-02 MED ORDER — FENTANYL CITRATE (PF) 100 MCG/2ML IJ SOLN
INTRAMUSCULAR | Status: AC
  Filled 2024-03-02: qty 2

## 2024-03-02 MED ORDER — LACTATED RINGERS IV SOLN: INTRAVENOUS | Status: DC

## 2024-03-02 MED ORDER — TRANEXAMIC ACID-NACL 1000-0.7 MG/100ML-% IV SOLN
1000.0000 mg | INTRAVENOUS | Status: AC
  Administered 2024-03-02: 1000 mg via INTRAVENOUS

## 2024-03-02 MED ORDER — PHENYLEPHRINE HCL-NACL 20-0.9 MG/250ML-% IV SOLN
INTRAVENOUS | Status: DC | PRN
  Administered 2024-03-02: 60 ug/min via INTRAVENOUS

## 2024-03-02 MED ORDER — PROPOFOL 10 MG/ML IV BOLUS
INTRAVENOUS | Status: DC | PRN
  Administered 2024-03-02: 150 mg via INTRAVENOUS

## 2024-03-02 MED ORDER — FENTANYL CITRATE PF 50 MCG/ML IJ SOSY
PREFILLED_SYRINGE | INTRAMUSCULAR | Status: AC
  Filled 2024-03-02: qty 1

## 2024-03-02 MED ORDER — KETOROLAC TROMETHAMINE 15 MG/ML IJ SOLN
15.0000 mg | Freq: Once | INTRAMUSCULAR | Status: AC
  Administered 2024-03-02: 15 mg via INTRAVENOUS

## 2024-03-02 MED ORDER — SODIUM CHLORIDE 0.9 % IV SOLN: INTRAVENOUS | Status: DC

## 2024-03-02 MED ORDER — TRANEXAMIC ACID-NACL 1000-0.7 MG/100ML-% IV SOLN
INTRAVENOUS | Status: AC
  Filled 2024-03-02: qty 200

## 2024-03-02 MED ORDER — FENTANYL CITRATE PF 50 MCG/ML IJ SOSY
50.0000 ug | PREFILLED_SYRINGE | Freq: Once | INTRAMUSCULAR | Status: AC
  Administered 2024-03-02: 50 ug via INTRAVENOUS

## 2024-03-02 MED ORDER — BUPIVACAINE LIPOSOME 1.3 % IJ SUSP
INTRAMUSCULAR | Status: AC
  Filled 2024-03-02: qty 10

## 2024-03-02 MED ORDER — SUCCINYLCHOLINE CHLORIDE 200 MG/10ML IV SOSY
PREFILLED_SYRINGE | INTRAVENOUS | Status: AC
  Filled 2024-03-02: qty 10

## 2024-03-02 MED ORDER — SODIUM CHLORIDE 0.9 % IR SOLN
Status: DC | PRN
  Administered 2024-03-02: 3000 mL

## 2024-03-02 MED ORDER — EPHEDRINE 5 MG/ML INJ
INTRAVENOUS | Status: AC
Start: 2024-03-02 — End: ?
  Filled 2024-03-02: qty 5

## 2024-03-02 MED ORDER — ACETAMINOPHEN 10 MG/ML IV SOLN
INTRAVENOUS | Status: AC
  Filled 2024-03-02: qty 100

## 2024-03-02 MED ORDER — ACETAMINOPHEN 10 MG/ML IV SOLN
INTRAVENOUS | Status: DC | PRN
  Administered 2024-03-02: 1000 mg via INTRAVENOUS

## 2024-03-02 MED ORDER — CEFAZOLIN SODIUM-DEXTROSE 2-4 GM/100ML-% IV SOLN
INTRAVENOUS | Status: AC
  Filled 2024-03-02: qty 100

## 2024-03-02 MED ORDER — FENTANYL CITRATE (PF) 100 MCG/2ML IJ SOLN: 25.0000 ug | INTRAMUSCULAR | Status: DC | PRN

## 2024-03-02 MED ORDER — INSULIN ASPART 100 UNIT/ML IJ SOLN
20.0000 [IU] | Freq: Once | INTRAMUSCULAR | Status: AC
  Administered 2024-03-02: 20 [IU] via SUBCUTANEOUS

## 2024-03-02 MED ORDER — ALBUTEROL SULFATE HFA 108 (90 BASE) MCG/ACT IN AERS
INHALATION_SPRAY | RESPIRATORY_TRACT | Status: DC | PRN
  Administered 2024-03-02: 4 via RESPIRATORY_TRACT

## 2024-03-02 MED ORDER — TRANEXAMIC ACID-NACL 1000-0.7 MG/100ML-% IV SOLN
INTRAVENOUS | Status: AC
  Filled 2024-03-02: qty 100

## 2024-03-02 MED ORDER — PHENYLEPHRINE HCL-NACL 20-0.9 MG/250ML-% IV SOLN
INTRAVENOUS | Status: AC
  Filled 2024-03-02: qty 250

## 2024-03-02 MED ORDER — OXYCODONE HCL 5 MG PO TABS: 5.0000 mg | ORAL_TABLET | ORAL | Status: DC | PRN

## 2024-03-02 MED ORDER — KETOROLAC TROMETHAMINE 15 MG/ML IJ SOLN
INTRAMUSCULAR | Status: AC
  Filled 2024-03-02: qty 1

## 2024-03-02 MED ORDER — FENTANYL CITRATE (PF) 100 MCG/2ML IJ SOLN
INTRAMUSCULAR | Status: DC | PRN
  Administered 2024-03-02: 50 ug via INTRAVENOUS

## 2024-03-02 MED ORDER — ACETAMINOPHEN 10 MG/ML IV SOLN: 1000.0000 mg | Freq: Once | INTRAVENOUS | Status: DC | PRN

## 2024-03-02 MED ORDER — BUPIVACAINE-EPINEPHRINE (PF) 0.5% -1:200000 IJ SOLN
INTRAMUSCULAR | Status: AC
Start: 2024-03-02 — End: ?
  Filled 2024-03-02: qty 30

## 2024-03-02 MED ORDER — OXYCODONE HCL 5 MG/5ML PO SOLN: 5.0000 mg | Freq: Once | ORAL | Status: DC | PRN

## 2024-03-02 MED ORDER — CHLORHEXIDINE GLUCONATE 0.12 % MT SOLN
15.0000 mL | Freq: Once | OROMUCOSAL | Status: AC
  Administered 2024-03-02: 15 mL via OROMUCOSAL

## 2024-03-02 MED ORDER — ENTRESTO 24-26 MG PO TABS: 1.0000 | ORAL_TABLET | Freq: Every day | ORAL | Status: AC

## 2024-03-02 MED ORDER — ONDANSETRON HCL 4 MG/2ML IJ SOLN
INTRAMUSCULAR | Status: DC | PRN
  Administered 2024-03-02: 4 mg via INTRAVENOUS

## 2024-03-02 SURGICAL SUPPLY — 62 items
BASEPLATE BOSS DRILL (MISCELLANEOUS) IMPLANT
BASEPLATE GLENOSPHERE 25 (Plate) IMPLANT
BIT DRILL BASEPLATE CENTRAL S (BIT) IMPLANT
BIT DRILL F/BASEPLATE CENTRAL (BIT) IMPLANT
BIT DRILL TWIST 2.7 (BIT) IMPLANT
BLADE SAW SAG 25X90X1.19 (BLADE) ×1 IMPLANT
CHLORAPREP W/TINT 26 (MISCELLANEOUS) ×1 IMPLANT
COMP HUM UNI IDENTI -6 EXT (Joint) IMPLANT
COOLER POLAR GLACIER W/PUMP (MISCELLANEOUS) ×1 IMPLANT
DRAPE INCISE IOBAN 66X45 STRL (DRAPES) ×1 IMPLANT
DRAPE SHEET LG 3/4 BI-LAMINATE (DRAPES) ×1 IMPLANT
DRAPE TABLE BACK 80X90 (DRAPES) ×1 IMPLANT
DRSG OPSITE POSTOP 4X8 (GAUZE/BANDAGES/DRESSINGS) ×1 IMPLANT
ELECT CAUTERY BLADE 6.4 (BLADE) ×1 IMPLANT
ELECTRODE BLDE 4.0 EZ CLN MEGD (MISCELLANEOUS) ×1 IMPLANT
ELECTRODE REM PT RTRN 9FT ADLT (ELECTROSURGICAL) ×1 IMPLANT
GAUZE XEROFORM 1X8 LF (GAUZE/BANDAGES/DRESSINGS) ×1 IMPLANT
GLENOSPHERE VERSADIAL 40 +3 (Joint) IMPLANT
GLOVE BIO SURGEON STRL SZ7.5 (GLOVE) ×4 IMPLANT
GLOVE BIO SURGEON STRL SZ8 (GLOVE) ×4 IMPLANT
GLOVE BIOGEL PI IND STRL 8 (GLOVE) ×2 IMPLANT
GLOVE INDICATOR 8.0 STRL GRN (GLOVE) ×1 IMPLANT
GOWN STRL REUS W/ TWL LRG LVL3 (GOWN DISPOSABLE) ×2 IMPLANT
GOWN STRL REUS W/ TWL XL LVL3 (GOWN DISPOSABLE) ×1 IMPLANT
GUIDE PIN 2.0 X 150 (WIRE) IMPLANT
HANDLE YANKAUER SUCT OPEN TIP (MISCELLANEOUS) ×1 IMPLANT
HOOD PEEL AWAY T7 (MISCELLANEOUS) ×3 IMPLANT
KIT STABILIZATION SHOULDER (MISCELLANEOUS) ×1 IMPLANT
KIT TURNOVER KIT A (KITS) ×1 IMPLANT
MANIFOLD NEPTUNE II (INSTRUMENTS) ×1 IMPLANT
MASK FACE SPIDER DISP (MASK) ×1 IMPLANT
MAT ABSORB FLUID 56X50 GRAY (MISCELLANEOUS) ×1 IMPLANT
NDL MAYO CATGUT SZ1 (NEEDLE) IMPLANT
NDL SAFETY ECLIPSE 18X1.5 (NEEDLE) ×1 IMPLANT
NDL SPNL 20GX3.5 QUINCKE YW (NEEDLE) ×1 IMPLANT
NEEDLE MAYO CATGUT SZ1 (NEEDLE) IMPLANT
NEEDLE SPNL 20GX3.5 QUINCKE YW (NEEDLE) ×1 IMPLANT
PACK ARTHROSCOPY SHOULDER (MISCELLANEOUS) ×1 IMPLANT
PAD ARMBOARD POSITIONER FOAM (MISCELLANEOUS) ×1 IMPLANT
PAD WRAPON POLAR SHDR UNIV (MISCELLANEOUS) ×1 IMPLANT
PENCIL SMOKE EVACUATOR (MISCELLANEOUS) ×1 IMPLANT
PIN THREADED REVERSE (PIN) IMPLANT
SCREW BONE STRL 6.5MMX25MM (Screw) IMPLANT
SCREW LOCKING NS 4.75MMX20MM (Screw) IMPLANT
SCREW LOCKING STRL 4.75X25X3.5 (Screw) IMPLANT
SLING ULTRA II LG (MISCELLANEOUS) IMPLANT
SOL .9 NS 3000ML IRR UROMATIC (IV SOLUTION) ×1 IMPLANT
SPONGE T-LAP 18X18 ~~LOC~~+RFID (SPONGE) ×2 IMPLANT
STAPLER SKIN PROX 35W (STAPLE) ×1 IMPLANT
STEM HUM IDENTITY STD 40 (Stem) IMPLANT
STEM HUMERAL MICRO 12 (Stem) IMPLANT
SUT ETHIBOND 0 MO6 C/R (SUTURE) ×1 IMPLANT
SUT VIC AB 0 CT1 36 (SUTURE) ×1 IMPLANT
SUT VIC AB 2-0 CT1 (SUTURE) IMPLANT
SUT VIC AB 2-0 CT1 TAPERPNT 27 (SUTURE) ×2 IMPLANT
SUTURE FIBERWR #2 38 BLUE 1/2 (SUTURE) ×4 IMPLANT
SYR 10ML LL (SYRINGE) ×1 IMPLANT
SYR 30ML LL (SYRINGE) ×1 IMPLANT
SYR TOOMEY 50ML (SYRINGE) ×1 IMPLANT
TIP FAN IRRIG PULSAVAC PLUS (DISPOSABLE) ×1 IMPLANT
TRAP FLUID SMOKE EVACUATOR (MISCELLANEOUS) ×1 IMPLANT
WATER STERILE IRR 500ML POUR (IV SOLUTION) ×1 IMPLANT

## 2024-03-02 NOTE — Anesthesia Preprocedure Evaluation (Addendum)
 Anesthesia Evaluation  Patient identified by MRN, date of birth, ID band Patient awake    Reviewed: Allergy & Precautions, NPO status , Patient's Chart, lab work & pertinent test results  History of Anesthesia Complications Negative for: history of anesthetic complications  Airway Mallampati: IV   Neck ROM: Full    Dental  (+) Missing   Pulmonary COPD, Current Smoker (2 ppd)Patient did not abstain from smoking.   Pulmonary exam normal breath sounds clear to auscultation       Cardiovascular hypertension, + CAD (s/p MI and CABG) and +CHF (ICM; EF 50%)  Normal cardiovascular exam+ dysrhythmias (a fib s/p ablation)  Rhythm:Regular Rate:Normal  ECG 02/25/24:  Normal sinus rhythm Possible Inferior infarct (cited on or before 03-Aug-2022)  Echo 07/31/22:  1. Left ventricular ejection fraction, by estimation, is 60 to 65%. The left ventricle has normal function. The left ventricle has no regional wall motion abnormalities. There is moderate left ventricular hypertrophy. Left ventricular diastolic parameters are indeterminate.  2. Right ventricular systolic function is normal. The right ventricular size is normal. There is mildly elevated pulmonary artery systolic pressure.  3. Left atrial size was mildly dilated.  4. Right atrial size was mildly dilated.  5. The mitral valve is normal in structure. No evidence of mitral valve regurgitation. Moderate mitral annular calcification.  6. The aortic valve is tricuspid. There is mild calcification of the aortic valve. Aortic valve regurgitation is not visualized.  7. The inferior vena cava is dilated in size with >50% respiratory variability, suggesting right atrial pressure of 8 mmHg.   Myocardial perfusion 04/12/20:  - No significant ischemia is noted on perfusion imaging  - There is a moderate in size, moderate in severity, fixed defect involving portions of the apex, apical inferior, basal  inferolateral, mid anterolateral, basal anterolateral, mid inferolateral and basal inferolateral segments. This is consistent with probable scar.  - Post stress: Global systolic function is mildly reduced. The ejection fraction calculated at 47%.  - Attenuation CT scan shows post PCI findings    Neuro/Psych  Headaches    GI/Hepatic negative GI ROS,,,  Endo/Other  diabetes, Type 2, Insulin  Dependent    Renal/GU      Musculoskeletal   Abdominal   Peds  Hematology negative hematology ROS (+)   Anesthesia Other Findings Last dose of Mounjaro 02/23/24.   Reviewed and agree with Donnetta Gains pre-anesthesia clinical review note.    Cardiology note 08/30/23:  Assessment and Plan:  Chronic systolic heart failure (CMS-HCC)  Ischemic CM  HFrecEF Previous echo with ICM, EF as low as 40-45% (09/2019) with improvement to 50% (11/2021). Intermittently takes lasix  for subjective increased edema but only for 1-2 weeks at a time, a couple times per year. Most recently took ~2 weeks ago and edema has resolved. No current or recent dyspnea on exertion or orthopnea. Last echo 07/2022 recovered EF of 60-65%. Visit Aug 2024 with some hypotension symptoms (lightheadedness, dizziness) likely secondary to medications so decreased entresto , now symptoms stable.  -- Continue reduced dose Entresto  12/13 mg daily, continue holding spiro  -- PO Lasix  20mg  PRN for swelling or weight gain, told not to take for several weeks as appears quite dry today  -- Continue metoprolol  100 -- Continue Statin -- Can't tolerate SGLT2i because of recurrent UTIs (yeast)  HLD - Continue home statin -- Ordered lipid panel today   Tobacco Use Disorder  Continues to smoke 1.5 ppd, a lot of difficulty quitting. Patches and gum don't help, reports mostly  the mental aspect is difficult. Open to virtual support  -- Smoking cessation clinic referral  -- Lozenges ordered (has tried previously)   T2DM  Follows with  Endocrinology. Last A1c 8.9% thought to be spurious.  -- Cont following with endocrine (appt today)  Return in about 6 months (around 02/27/2024) for Recheck.    Reproductive/Obstetrics                             Anesthesia Physical Anesthesia Plan  ASA: 3  Anesthesia Plan: General and Regional   Post-op Pain Management: Regional block*   Induction: Intravenous  PONV Risk Score and Plan: 1 and Ondansetron , Dexamethasone  and Treatment may vary due to age or medical condition  Airway Management Planned: Oral ETT  Additional Equipment:   Intra-op Plan:   Post-operative Plan: Extubation in OR  Informed Consent: I have reviewed the patients History and Physical, chart, labs and discussed the procedure including the risks, benefits and alternatives for the proposed anesthesia with the patient or authorized representative who has indicated his/her understanding and acceptance.     Dental advisory given  Plan Discussed with: CRNA  Anesthesia Plan Comments: (Plan for preoperative interscalene nerve block and GETA. Patient consented for risks of anesthesia including but not limited to:  - adverse reactions to medications - damage to eyes, teeth, lips or other oral mucosa - nerve damage due to positioning  - sore throat or hoarseness - damage to heart, brain, nerves, lungs, other parts of body or loss of life  Informed patient about role of CRNA in peri- and intra-operative care.  Patient voiced understanding.)        Anesthesia Quick Evaluation

## 2024-03-02 NOTE — Transfer of Care (Signed)
 Immediate Anesthesia Transfer of Care Note  Patient: [name]  Procedure(s) Performed: ARTHROPLASTY, SHOULDER, TOTAL, REVERSE (Left: Shoulder)  Patient Location: PACU  Anesthesia Type:General and Regional  Level of Consciousness: drowsy and patient cooperative  Airway & Oxygen  Therapy: Patient Spontanous Breathing and Patient connected to face mask oxygen   Post-op Assessment: Report given to RN, Post -op Vital signs reviewed and stable, and Patient moving all extremities  Post vital signs: Reviewed and stable  Last Vitals:  Vitals Value Taken Time  BP 94/55 03/02/24 1315  Temp    Pulse 72 03/02/24 1319  Resp 16 03/02/24 1319  SpO2 96 % 03/02/24 1319  Vitals shown include unfiled device data.  Last Pain:  Vitals:   03/02/24 0920  PainSc: 0-No pain         Complications: No notable events documented.

## 2024-03-02 NOTE — Evaluation (Signed)
 Physical Therapy Evaluation Patient Details Name: [name] MRN: [id number] DOB: [birth year] Today's Date: 03/02/2024  History of Present Illness  Pt is a 63 y.o. male s/p reverse L total shoulder arthroplasty 03/02/24 d/t massive irreparable rotator cuff tear with early cuff arthropathy; s/p interscalene brachial plexus block.  PMH includes DM, heart attack, HLD, htn, RCR, CHF, COPD, hiatal hernia repair, triple bypass.  Clinical Impression  Prior to surgery, pt reports being independent with ambulation; h/o falls/imbalance; lives with his mother in 1 level home with 3-4 STE B railings.  No c/o pain during session.  Currently pt is SBA with transfers; CGA with ambulation; and CGA navigating 4 steps with use of railing.  Pt appearing unsteady ambulating without UE support but improved balance/gait mechanics noted ambulating with SPC use.  Pt would currently benefit from skilled PT to address noted impairments and functional limitations (see below for any additional details).  Upon hospital discharge, pt would benefit from ongoing therapy.  Currently recommend 24/7 assist for safety with functional mobility (pt reports still feeling off after surgery; pt's daughters report pt requires extra time to come out of anesthesia).  MD, TOC, and nurse notified of PT recommendations for [facility] for safe home discharge.     If plan is discharge home, recommend the following: A little help with walking and/or transfers;A little help with bathing/dressing/bathroom;Assistance with cooking/housework;Assist for transportation;Help with stairs or ramp for entrance   Can travel by private vehicle        Equipment Recommendations Cane  Recommendations for Other Services       Functional Status Assessment Patient has had a recent decline in their functional status and demonstrates the ability to make significant improvements in function in a reasonable and predictable amount of time.     Precautions / Restrictions  Precautions Precautions: Shoulder;Fall Shoulder Interventions: Shoulder sling/immobilizer;Shoulder abduction pillow;At all times;Off for dressing/bathing/exercises Precaution Booklet Issued: Yes (comment) Recall of Precautions/Restrictions: Impaired Restrictions Weight Bearing Restrictions Per Provider Order: Yes LUE Weight Bearing Per Provider Order: Non weight bearing      Mobility  Bed Mobility               General bed mobility comments: Not tested (pt in recliner beginning/end of session; no bed in pt's room)    Transfers Overall transfer level: Needs assistance Equipment used: None Transfers: Sit to/from Stand Sit to Stand: Supervision           General transfer comment: vc's for cane use with transfers    Ambulation/Gait Ambulation/Gait assistance: Contact guard assist Gait Distance (Feet):  (100 feet no AD use; 100 feet x2 with SPC use; 50 feet with SBQC use) Assistive device: Straight cane, Quad cane, None Gait Pattern/deviations: Step-through pattern, Decreased step length - right, Decreased step length - left Gait velocity: decreased     General Gait Details: pt mildly unsteady intermittently ambulating without UE support; improved balance/gait mechanics noted using SPC (pt had difficulty using SBQC so switched back to [facility] use); initial vc's for walking pattern with cane use  Stairs Stairs: Yes Stairs assistance: Contact guard assist Stair Management: One rail Right, Alternating pattern, Step to pattern, Forwards Number of Stairs: 4 General stair comments: alternating pattern ascending steps; step to pattern descending steps; steady stairs navigation  Wheelchair Mobility     Tilt Bed    Modified Rankin (Stroke Patients Only)       Balance Overall balance assessment: Needs assistance Sitting-balance support: Feet supported, No upper extremity supported Sitting balance-Leahy  Scale: Good Sitting balance - Comments: steady reaching within  support with R UE   Standing balance support: During functional activity, No upper extremity supported Standing balance-Leahy Scale: Fair Standing balance comment: pt appearing to be unsteady intermittently requiring CGA for safety (pt able to self correct)                             Pertinent Vitals/Pain Pain Assessment Pain Assessment: No/denies pain Pain Intervention(s): Limited activity within patient's tolerance, Monitored during session    Home Living Family/patient expects to be discharged to:: Private residence Living Arrangements: Parent (Pt's mom) Available Help at Discharge: Family;Available 24 hours/day Type of Home: House Home Access: Stairs to enter Entrance Stairs-Rails: Doctor, general practice of Steps: 3-4   Home Layout: One level Home Equipment: Other (comment) (Pt reports no DME at home)      Prior Function Prior Level of Function : Independent/Modified Independent;Driving;History of Falls (last six months)             Mobility Comments: Independent with ambulation; holds onto furniture as needed; h/o falls d/t lightheadedness or loss of balance ADLs Comments: Independent     Extremity/Trunk Assessment   Upper Extremity Assessment Upper Extremity Assessment: Right hand dominant;LUE deficits/detail LUE Deficits / Details: S/p reverse TSA LUE: Unable to fully assess due to immobilization    Lower Extremity Assessment Lower Extremity Assessment: Overall WFL for tasks assessed    Cervical / Trunk Assessment Cervical / Trunk Assessment: Normal  Communication   Communication Communication: No apparent difficulties    Cognition Arousal: Alert Behavior During Therapy: WFL for tasks assessed/performed   PT - Cognitive impairments: No apparent impairments                         Following commands: Intact       Cueing Cueing Techniques: Verbal cues     General Comments    Exercises     Assessment/Plan     PT Assessment Patient needs continued PT services  PT Problem List Decreased strength;Decreased range of motion;Decreased balance;Decreased mobility;Decreased knowledge of use of DME;Decreased knowledge of precautions;Decreased skin integrity;Impaired sensation;Pain       PT Treatment Interventions DME instruction;Gait training;Stair training;Functional mobility training;Therapeutic activities;Therapeutic exercise;Balance training;Patient/family education    PT Goals (Current goals can be found in the Care Plan section)  Acute Rehab PT Goals Patient Stated Goal: to improve balance PT Goal Formulation: With patient/family Time For Goal Achievement: 03/16/24 Potential to Achieve Goals: Good    Frequency 7X/week     Co-evaluation               AM-PAC PT "6 Clicks" Mobility  Outcome Measure Help needed turning from your back to your side while in a flat bed without using bedrails?: None Help needed moving from lying on your back to sitting on the side of a flat bed without using bedrails?: None Help needed moving to and from a bed to a chair (including a wheelchair)?: A Little Help needed standing up from a chair using your arms (e.g., wheelchair or bedside chair)?: A Little Help needed to walk in hospital room?: A Little Help needed climbing 3-5 steps with a railing? : A Little 6 Click Score: 20    End of Session Equipment Utilized During Treatment: Gait belt;Other (comment) (L shoulder sling/immobilizer) Activity Tolerance: Patient tolerated treatment well Patient left: in chair;with call bell/phone within reach;with family/visitor present;Other (  comment) (polar care in place) Nurse Communication: Mobility status;Precautions;Other (comment) (Recommendations for [facility]) PT Visit Diagnosis: Unsteadiness on feet (R26.81);Other abnormalities of gait and mobility (R26.89);History of falling (Z91.81);Pain Pain - Right/Left: Left Pain - part of body: Shoulder    Time:  1610-9604 PT Time Calculation (min) (ACUTE ONLY): 26 min   Charges:   PT Evaluation $PT Eval Low Complexity: 1 Low PT Treatments $Gait Training: 8-22 mins PT General Charges $$ ACUTE PT VISIT: 1 Visit        Amador Junes, PT 03/02/24, 5:18 PM

## 2024-03-02 NOTE — Anesthesia Procedure Notes (Signed)
 Anesthesia Regional Block: Interscalene brachial plexus block   Pre-Anesthetic Checklist: , timeout performed,  Correct Patient, Correct Site, Correct Laterality,  Correct Procedure,, risks and benefits discussed,  Surgical consent,  Pre-op evaluation,  At surgeon's request and post-op pain management  Laterality: Left  Prep: chloraprep       Needles:  Injection technique: Single-shot  Needle Type: Echogenic Needle          Additional Needles:   Procedures:,,,, ultrasound used (permanent image in chart),,   Motor weakness within 20 minutes.  Narrative:  Start time: 03/02/2024 9:47 AM End time: 03/02/2024 9:50 AM Injection made incrementally with aspirations every 5 mL.  Performed by: Personally  Anesthesiologist: Dorothey Gate, MD  Additional Notes: Functioning IV was confirmed and monitors applied.  Sterile prep and drape, hand hygiene and sterile gloves were used. Ultrasound guidance: relevant anatomy identified, needle position confirmed, local anesthetic spread visualized around nerve(s), vascular puncture avoided.  Image saved to electronic medical record.  Negative aspiration prior to incremental administration of local anesthetic for total 20 ml Exparel  and 10 ml bupivacaine  0.5% given in interscalene distribution. The patient tolerated the procedure well. Vital signs and moderate sedation medications recorded in RN notes.

## 2024-03-02 NOTE — Op Note (Signed)
 03/02/2024  12:35 PM  Patient:   [name]  Pre-Op Diagnosis:   Massive irreparable rotator cuff tear with early cuff arthropathy, left shoulder.  Post-Op Diagnosis:   Same  Procedure:   Reverse left total shoulder arthroplasty.  Surgeon:   Lonnie Roberts, MD  Assistant:   Ashton Blakes, RNFA  Anesthesia:   General endotracheal with an interscalene block using Exparel  placed preoperatively by the anesthesiologist.  Findings:   As above.  Complications:   None  EBL:   100 cc  Fluids:   800 cc crystalloid  UOP:   None  TT:   None  Drains:   None  Closure:   Staples  Implants:   All press-fit [employer] Comprehensive system with a #12 identity micro-humeral stem, a -6 mm extra extended neutral identity humeral tray with a +0 mm insert, and a mini-base plate with a 40 mm +3 mm lateralized glenosphere.  Brief Clinical Note:   The patient is a 63 year old male with a long history of progressively worsening pain and weakness of the left shoulder.  The patient's symptoms have progressed despite medications, activity modification, etc. The patient's history and examination are consistent with a massive irreparable rotator cuff tear with early cuff arthropathy, all of which were confirmed by MRI scan preoperatively. The patient presents at this time for a reverse left total shoulder arthroplasty.  Procedure:   The patient underwent placement of an interscalene block using Exparel  by the anesthesiologist in the preoperative holding area before being brought into the operating room and lain in the supine position. The patient then underwent general endotracheal intubation and anesthesia before the patient was repositioned in the beach chair position using the beach chair positioner. The left shoulder and upper extremity were prepped with ChloraPrep solution before being draped sterilely. Preoperative antibiotics were administered. A timeout was performed to verify the appropriate  surgical site.    A standard anterior approach to the shoulder was made through an approximately 4-5 inch incision. The incision was carried down through the subcutaneous tissues to expose the deltopectoral fascia. The interval between the deltoid and pectoralis muscles was identified and this plane developed, retracting the cephalic vein laterally with the deltoid muscle. The conjoined tendon was identified. Its lateral margin was dissected and the Kolbel self-retraining retractor inserted. The "three sisters" were identified and cauterized. Bursal tissues were removed to improve visualization.   The biceps tendon was identified near the inferior aspect of the bicipital groove. A soft tissue tenodesis was performed by attaching the biceps tendon to the adjacent pectoralis major tendon using two #0 Ethibond interrupted sutures. The biceps tendon was then transected just proximal to the tenodesis site. The subscapularis tendon was released from its attachment to the lesser tuberosity 1 cm proximal to its insertion and several tagging sutures placed. The inferior capsule was released with care after identifying and protecting the axillary nerve. The proximal humeral cut was made at approximately 25 of retroversion using the extra-medullary guide.   Attention was redirected to the glenoid. The labrum was debrided circumferentially before the center of the glenoid was marked with electrocautery. The guidewire was drilled into the glenoid vault using the appropriate guide. After verifying its position, it was overreamed with the mini-baseplate reamer to create a flat surface. The permanent mini-baseplate was impacted into place. It was stabilized with a 25 x 6.5 mm central screw and four peripheral locking screws. The permanent 40 mm +3 mm lateralized glenosphere was then impacted into place and  its Morse taper locking mechanism verified using manual distraction.  Attention was directed to the humeral side. The  humeral canal was reamed sequentially beginning with the end-cutting reamer then progressing from a 4 mm reamer up to a 12 mm reamer. This provided excellent circumferential chatter. The canal was broached beginning with a #10 broach and progressing to a #12 broach.  The plastic stem was inserted into the end of the broach and the proximal reaming performed. A trial reduction was performed using the -3 extra extended neutral humeral platform with the +0 mm insert. With the +0 mm insert, the arm demonstrated excellent range of motion as the hand could be brought across the chest to the opposite shoulder and brought to the top of the patient's head and to the patient's ear. The shoulder appeared stable throughout this range of motion. The joint was dislocated and the trial components removed.   The permanent #12 Identity micro-stem was connected with the -6 mm extra extended neutral humeral platform on the back table before this construct was impacted into place with care taken to maintain the appropriate version. The +0 mm insert was snapped into place. The shoulder was relocated using two finger pressure and again placed through a range of motion with the findings as described above.  The wound was copiously irrigated with sterile saline solution using the jet lavage system before a total of 30 cc of 0.5% Sensorcaine  with epinephrine  was injected into the pericapsular and peri-incisional tissues to help with postoperative analgesia. The subscapularis tendon was reapproximated using #2 FiberWire interrupted sutures. The deltopectoral interval was closed using #0 Vicryl interrupted sutures before the subcutaneous tissues were closed using 2-0 Vicryl interrupted sutures. The skin was closed using staples. A sterile occlusive dressing was applied to the wound before the arm was placed into a shoulder immobilizer with an abduction pillow. A Polar Care system also was applied to the shoulder. The patient was then  transferred back to a hospital bed before being awakened, extubated, and returned to the recovery room in satisfactory condition after tolerating the procedure well.

## 2024-03-02 NOTE — Anesthesia Postprocedure Evaluation (Signed)
 Anesthesia Post Note  Patient: [name]  Procedure(s) Performed: ARTHROPLASTY, SHOULDER, TOTAL, REVERSE (Left: Shoulder)  Patient location during evaluation: PACU Anesthesia Type: Regional Level of consciousness: awake and alert, oriented and patient cooperative Pain management: pain level controlled Vital Signs Assessment: post-procedure vital signs reviewed and stable Respiratory status: spontaneous breathing, nonlabored ventilation and respiratory function stable Cardiovascular status: blood pressure returned to baseline and stable Postop Assessment: adequate PO intake Anesthetic complications: no   No notable events documented.   Last Vitals:  Vitals:   03/02/24 1345 03/02/24 1413  BP: (!) 91/49 94/62  Pulse: 70 69  Resp: 14 14  Temp:    SpO2: 96% 94%    Last Pain:  Vitals:   03/02/24 1413  PainSc: 0-No pain                 Dorothey Gate

## 2024-03-02 NOTE — Evaluation (Signed)
 Occupational Therapy Evaluation Patient Details Name: [name] MRN: [id number] DOB: Sep 21, 1961 Today's Date: 03/02/2024   History of Present Illness   62yo male s/p reverse L TSA on 03/02/24. PMHx includes HTN, CAD s/p CABG, CHF, COPD, smoker, T2DM, and HLD.     Clinical Impressions Patient was seen for an OT evaluation this date. Pt lives with his mother and generally independent with ADL and mobility at baseline. Pt does endorse furniture walking and occasional LOB and hx of falls. Pt has orders for LUE to be immobilized and will be NWBing per MD. Patient presents with impaired strength/ROM, balance, safety awareness, and sensation to LUE with block in place. These impairments result in a decreased ability to perform self care tasks requiring MOD A for UB dressing and bathing, MIN A for LB dressing and bathing, and MAX A for application of polar care and sling/immobilizer. Pt required CGA with handheld assist for mobility and ADL transfers with a few slight LOBs requiring CGA vs MIN A. Pt/daughters instructed in polar care mgt, sling/immobilizer mgt, LUE precautions, adaptive strategies for bathing/dressing/toileting/grooming, positioning and considerations for sleep, and home/routines modifications to maximize falls prevention, safety, and independence. Handout provided. OT adjusted sling/immobilizer and polar care to improve comfort, optimize positioning, and to maximize skin integrity/safety. Pt/dtrs verbalized understanding of all education/training provided. Pt will benefit from PT consult prior to discharge and follow up therapy as arranged by the MD for shoulder rehab.      If plan is discharge home, recommend the following:   A little help with walking and/or transfers;A lot of help with bathing/dressing/bathroom;Assistance with cooking/housework;Assist for transportation;Help with stairs or ramp for entrance     Functional Status Assessment   Patient has had a recent decline  in their functional status and demonstrates the ability to make significant improvements in function in a reasonable and predictable amount of time.     Equipment Recommendations   Other (comment) (AD per PT)     Recommendations for Other Services   PT consult     Precautions/Restrictions   Precautions Precautions: Shoulder;Fall Shoulder Interventions: Shoulder sling/immobilizer;Shoulder abduction pillow;At all times;Off for dressing/bathing/exercises Precaution Booklet Issued: Yes (comment) Recall of Precautions/Restrictions: Impaired Precaution/Restrictions Comments: difficulty maintaining neutral shoulder 2/2 impaired sensation Restrictions Weight Bearing Restrictions Per Provider Order: Yes LUE Weight Bearing Per Provider Order: Non weight bearing     Mobility Bed Mobility       General bed mobility comments: NT, in recliner    Transfers Overall transfer level: Needs assistance Equipment used: 1 person hand held assist Transfers: Sit to/from Stand Sit to Stand: Contact guard assist           General transfer comment: unsteady, requires  CGA to maintain balance      Balance Overall balance assessment: Needs assistance Sitting-balance support: Feet supported, No upper extremity supported Sitting balance-Leahy Scale: Good     Standing balance support: Single extremity supported, During functional activity Standing balance-Leahy Scale: Poor Standing balance comment: Pt with several slight LOBs with mobility requiring CGA-MIN A to correct for LOBs.       ADL either performed or assessed with clinical judgement   ADL Overall ADL's : Needs assistance/impaired     Grooming: Sitting;Minimal assistance           Upper Body Dressing : Sitting;Moderate assistance;Cueing for sequencing;Cueing for safety Upper Body Dressing Details (indicate cue type and reason): educated in optimal clothing types Lower Body Dressing: Minimal assistance;Sit to/from  stand Lower Body Dressing  Details (indicate cue type and reason): educated in optimal clothing types Toilet Transfer: Electrical engineer and Hygiene: Modified independent;Sitting/lateral lean       Functional mobility during ADLs: Contact guard assist;Minimal assistance;Cueing for sequencing;Cueing for safety        Pertinent Vitals/Pain Pain Assessment Pain Assessment: 0-10 Pain Score: 7  Pain Location: tailbone Pain Descriptors / Indicators: Aching Pain Intervention(s): Limited activity within patient's tolerance, Monitored during session, Repositioned     Extremity/Trunk Assessment Upper Extremity Assessment Upper Extremity Assessment: Right hand dominant;LUE deficits/detail LUE Deficits / Details: s/p rev TSA   Lower Extremity Assessment Lower Extremity Assessment: Overall WFL for tasks assessed       Communication Communication Communication: No apparent difficulties   Cognition Arousal: Alert Behavior During Therapy: WFL for tasks assessed/performed Cognition: No apparent impairments       Following commands: Intact       Cueing  General Comments   Cueing Techniques: Verbal cues  BP sitting 108/68, after toileting and back to recliner with mild lightheadedness 121/70. BG 359 per daughters (pt has dexcom), RN notified of BP and BG   Exercises Other Exercises Other Exercises: Pt/family educated in home/routines modifications for ADL and mobility, precautions, sling mgt, polar care mgt, falls prevention. Handout provided   Shoulder Instructions      Home Living Family/patient expects to be discharged to:: Private residence Living Arrangements: Parent (parent) Available Help at Discharge: Family;Available 24 hours/day Type of Home: House Home Access: Stairs to enter [employer] of Steps: 3-4 Entrance Stairs-Rails: Right;Left Home Layout: One level     Bathroom Shower/Tub: Multimedia programmer: Standard     Home Equipment: Agricultural consultant (2 wheels);Cane - single point          Prior Functioning/Environment Prior Level of Function : Independent/Modified Independent;Driving;History of Falls (last six months)             Mobility Comments: no AD, hx falls 2/2 lightheaded or LOB ADLs Comments: indep    OT Problem List: Impaired sensation;Decreased safety awareness;Impaired balance (sitting and/or standing);Decreased knowledge of use of DME or AE;Impaired UE functional use;Decreased knowledge of precautions        OT Goals(Current goals can be found in the care plan section)   Acute Rehab OT Goals Patient Stated Goal: go home OT Goal Formulation: All assessment and education complete, DC therapy   AM-PAC OT "6 Clicks" Daily Activity     Outcome Measure Help from another person eating meals?: None Help from another person taking care of personal grooming?: A Little Help from another person toileting, which includes using toliet, bedpan, or urinal?: A Little Help from another person bathing (including washing, rinsing, drying)?: A Lot Help from another person to put on and taking off regular upper body clothing?: A Lot Help from another person to put on and taking off regular lower body clothing?: A Little 6 Click Score: 17   End of Session Equipment Utilized During Treatment: Gait belt Nurse Communication: Mobility status;Other (comment) (BP, lightheaded, balance, and BG)  Activity Tolerance: Patient tolerated treatment well Patient left: in chair;with call bell/phone within reach;with family/visitor present;with nursing/sitter in room;Other (comment) (sling and polar care in place)  OT Visit Diagnosis: Unsteadiness on feet (R26.81);History of falling (Z91.81)                Time: 1610-9604 OT Time Calculation (min): 48 min Charges:  OT General Charges $OT Visit: 1 Visit  OT Evaluation $OT Eval Low Complexity: 1 Low OT  Treatments $Self Care/Home Management : 38-52 mins  Berenda Breaker., MPH, MS, OTR/L ascom [phone] 03/02/24, 5:03 PM

## 2024-03-02 NOTE — TOC Progression Note (Signed)
 Transition of Care [facility]) - Progression Note    Patient Details  Name: [name] MRN: [id number] Date of Birth: [birth year]  [facility]) CM/SW Contact  Alexandra Ice, RN Phone Number: 03/02/2024, 4:37 PM  Clinical Narrative:     Received message patient needs single point cane for discharge today. Sent referral to Jon with [employer] for processing.        Expected Discharge Plan and Services         Expected Discharge Date: 03/02/24                                     Social Determinants of Health (SDOH) Interventions SDOH Screenings   Food Insecurity: No Food Insecurity (08/10/2022)  Housing: Unknown (02/16/2024)   Received from [facility]  Transportation Needs: No Transportation Needs (08/10/2022)  Utilities: Not At Risk (08/10/2022)  Alcohol Screen: Low Risk  (08/10/2022)  Depression (PHQ2-9): Low Risk  (02/11/2024)  Financial Resource Strain: Low Risk  (08/10/2022)  Physical Activity: Insufficiently Active (08/10/2022)  Social Connections: Socially Isolated (08/10/2022)  Stress: No Stress Concern Present (08/10/2022)  Tobacco Use: High Risk (03/02/2024)  Health Literacy: Medium Risk (02/08/2020)   Received from [facility], [facility]    Readmission Risk Interventions     No data to display

## 2024-03-02 NOTE — Anesthesia Procedure Notes (Signed)
 Procedure Name: Intubation Date/Time: 03/02/2024 10:36 AM  Performed by: Alphonse Asal, RNPre-anesthesia Checklist: Patient identified, Emergency Drugs available, Suction available and Patient being monitored Patient Re-evaluated:Patient Re-evaluated prior to induction Oxygen  Delivery Method: Circle system utilized Preoxygenation: Pre-oxygenation with 100% oxygen  Induction Type: IV induction Ventilation: Mask ventilation without difficulty Laryngoscope Size: McGrath and 4 Tube type: Oral Tube size: 7.5 mm Number of attempts: 1 Airway Equipment and Method: Stylet and Oral airway Placement Confirmation: ETT inserted through vocal cords under direct vision, positive ETCO2 and breath sounds checked- equal and bilateral Secured at: 22 cm Tube secured with: Tape Dental Injury: Teeth and Oropharynx as per pre-operative assessment  Comments: DL x1 with McGrath MAC 4 blade, grade 1 view. Atraumatic. Dentition unchanged from preop baseline.

## 2024-03-02 NOTE — Discharge Instructions (Addendum)
 Orthopedic discharge instructions: May shower with intact OpSite dressing once nerve block has worn off (Monday).  Apply ice frequently to shoulder or use Polar Care device. Take ibuprofen 600 mg TID with meals for 7-10 days, then as necessary. Take oxycodone  as prescribed when needed.  May supplement with ES Tylenol  if necessary. May resume daily baby aspirin  tomorrow morning. Keep shoulder immobilizer on at all times except may remove for bathing purposes. Follow-up in 10-14 days or as scheduled.POLAR CARE INFORMATION  MassAdvertisement.it  How to use [employer] Polar Care [facility]?  YouTube   ShippingScam.co.uk  OPERATING INSTRUCTIONS  Start the product With dry hands, connect the transformer to the electrical connection located on the top of the cooler. Next, plug the transformer into an appropriate electrical outlet. The unit will automatically start running at this point.  To stop the pump, disconnect electrical power.  Unplug to stop the product when not in use. Unplugging the Polar Care unit turns it off. Always unplug immediately after use. Never leave it plugged in while unattended. Remove pad.    FIRST ADD WATER  TO FILL LINE, THEN ICE---Replace ice when existing ice is almost melted  1 Discuss Treatment with your Licensed Health Care Practitioner and Use Only as Prescribed 2 Apply Insulation Barrier & Cold Therapy Pad 3 Check for Moisture 4 Inspect Skin Regularly  Tips and Trouble Shooting Usage Tips 1. Use cubed or chunked ice for optimal performance. 2. It is recommended to drain the Pad between uses. To drain the pad, hold the Pad upright with the hose pointed toward the ground. Depress the black plunger and allow water  to drain out. 3. You may disconnect the Pad from the unit without removing the pad from the affected area by depressing the silver  tabs on the hose coupling and gently pulling the hoses apart. The Pad and unit will seal  itself and will not leak. Note: Some dripping during release is normal. 4. DO NOT RUN PUMP WITHOUT WATER ! The pump in this unit is designed to run with water . Running the unit without water  will cause permanent damage to the pump. 5. Unplug unit before removing lid.  TROUBLESHOOTING GUIDE Pump not running, Water  not flowing to the pad, Pad is not getting cold 1. Make sure the transformer is plugged into the wall outlet. 2. Confirm that the ice and water  are filled to the indicated levels. 3. Make sure there are no kinks in the pad. 4. Gently pull on the blue tube to make sure the tube/pad junction is straight. 5. Remove the pad from the treatment site and ll it while the pad is lying at; then reapply. 6. Confirm that the pad couplings are securely attached to the unit. Listen for the double clicks (Figure 1) to confirm the pad couplings are securely attached.  Leaks    Note: Some condensation on the lines, controller, and pads is unavoidable, especially in warmer climates. 1. If using a Breg Polar Care Cold Therapy unit with a detachable Cold Therapy Pad, and a leak exists (other than condensation on the lines) disconnect the pad couplings. Make sure the silver  tabs on the couplings are depressed before reconnecting the pad to the pump hose; then confirm both sides of the coupling are properly clicked in. 2. If the coupling continues to leak or a leak is detected in the pad itself, stop using it and call B[employer] Customer Care at [phone].  Cleaning After use, empty and dry the unit with a soft  cloth. Warm water  and mild detergent may be used occasionally to clean the pump and tubes.  WARNING: The Polar Care Cube can be cold enough to cause serious injury, including full skin necrosis. Follow these Operating Instructions, and carefully read the Product Insert (see pouch on side of unit) and the Cold Therapy Pad Fitting Instructions (provided with each Cold Therapy Pad) prior to use.           Interscalene Nerve Block with Exparel    For your surgery you have received an Interscalene Nerve Block with Exparel . Nerve Blocks affect many types of nerves, including nerves that control movement, pain and normal sensation.  You may experience feelings such as numbness, tingling, heaviness, weakness or the inability to move your arm or the feeling or sensation that your arm has "fallen asleep". A nerve block with Exparel  can last up to 5 days.  Usually the weakness wears off first.  The tingling and heaviness usually wear off next.  Finally you may start to notice pain.  Keep in mind that this may occur in any order.  Once a nerve block starts to wear off it is usually completely gone within 60 minutes. ISNB may cause mild shortness of breath, a hoarse voice, blurry vision, unequal pupils, or drooping of the face on the same side as the nerve block.  These symptoms will usually resolve with the numbness.  Very rarely the procedure itself can cause mild seizures. If needed, your surgeon will give you a prescription for pain medication.  It will take about 60 minutes for the oral pain medication to become fully effective.  So, it is recommended that you start taking this medication before the nerve block first begins to wear off, or when you first begin to feel discomfort. Take your pain medication only as prescribed.  Pain medication can cause sedation and decrease your breathing if you take more than you need for the level of pain that you have. Nausea is a common side effect of many pain medications.  You may want to eat something before taking your pain medicine to prevent nausea. After an Interscalene nerve block, you cannot feel pain, pressure or extremes in temperature in the effected arm.  Because your arm is numb it is at an increased risk for injury.  To decrease the possibility of injury, please practice the following:  While you are awake change the position of your arm frequently to  prevent too much pressure on any one area for prolonged periods of time.  If you have a cast or tight dressing, check the color or your fingers every couple of hours.  Call your surgeon with the appearance of any discoloration (white or blue). If you are given a sling to wear before you go home, please wear it  at all times until the block has completely worn off.  Do not get up at night without your sling. Please contact [facility] or your surgeon if you do not begin to regain sensation after 7 days from the surgery.  Anesthesia may be contacted by calling the Same Day Surgery Department, Mon. through Fri., 6 am to 4 pm at [phone].   If you experience any other problems or concerns, please contact your surgeon's office. If you experience severe or prolonged shortness of breath go to the nearest emergency department.

## 2024-03-03 ENCOUNTER — Encounter: Payer: Self-pay | Admitting: Surgery

## 2024-03-04 DIAGNOSIS — J449 Chronic obstructive pulmonary disease, unspecified: Secondary | ICD-10-CM | POA: Diagnosis not present

## 2024-03-04 DIAGNOSIS — Z7982 Long term (current) use of aspirin: Secondary | ICD-10-CM | POA: Diagnosis not present

## 2024-03-04 DIAGNOSIS — Z9181 History of falling: Secondary | ICD-10-CM | POA: Diagnosis not present

## 2024-03-04 DIAGNOSIS — M546 Pain in thoracic spine: Secondary | ICD-10-CM | POA: Diagnosis not present

## 2024-03-04 DIAGNOSIS — I5022 Chronic systolic (congestive) heart failure: Secondary | ICD-10-CM | POA: Diagnosis not present

## 2024-03-04 DIAGNOSIS — Z471 Aftercare following joint replacement surgery: Secondary | ICD-10-CM | POA: Diagnosis not present

## 2024-03-04 DIAGNOSIS — G4733 Obstructive sleep apnea (adult) (pediatric): Secondary | ICD-10-CM | POA: Diagnosis not present

## 2024-03-04 DIAGNOSIS — Z556 Problems related to health literacy: Secondary | ICD-10-CM | POA: Diagnosis not present

## 2024-03-04 DIAGNOSIS — D696 Thrombocytopenia, unspecified: Secondary | ICD-10-CM | POA: Diagnosis not present

## 2024-03-04 DIAGNOSIS — I480 Paroxysmal atrial fibrillation: Secondary | ICD-10-CM | POA: Diagnosis not present

## 2024-03-04 DIAGNOSIS — I252 Old myocardial infarction: Secondary | ICD-10-CM | POA: Diagnosis not present

## 2024-03-04 DIAGNOSIS — E785 Hyperlipidemia, unspecified: Secondary | ICD-10-CM | POA: Diagnosis not present

## 2024-03-04 DIAGNOSIS — E1169 Type 2 diabetes mellitus with other specified complication: Secondary | ICD-10-CM | POA: Diagnosis not present

## 2024-03-04 DIAGNOSIS — Z85828 Personal history of other malignant neoplasm of skin: Secondary | ICD-10-CM | POA: Diagnosis not present

## 2024-03-04 DIAGNOSIS — E7800 Pure hypercholesterolemia, unspecified: Secondary | ICD-10-CM | POA: Diagnosis not present

## 2024-03-04 DIAGNOSIS — Z7984 Long term (current) use of oral hypoglycemic drugs: Secondary | ICD-10-CM | POA: Diagnosis not present

## 2024-03-04 DIAGNOSIS — I110 Hypertensive heart disease with heart failure: Secondary | ICD-10-CM | POA: Diagnosis not present

## 2024-03-04 DIAGNOSIS — Z794 Long term (current) use of insulin: Secondary | ICD-10-CM | POA: Diagnosis not present

## 2024-03-04 DIAGNOSIS — Z96612 Presence of left artificial shoulder joint: Secondary | ICD-10-CM | POA: Diagnosis not present

## 2024-03-04 DIAGNOSIS — Z951 Presence of aortocoronary bypass graft: Secondary | ICD-10-CM | POA: Diagnosis not present

## 2024-03-06 DIAGNOSIS — E785 Hyperlipidemia, unspecified: Secondary | ICD-10-CM | POA: Diagnosis not present

## 2024-03-06 DIAGNOSIS — I480 Paroxysmal atrial fibrillation: Secondary | ICD-10-CM | POA: Diagnosis not present

## 2024-03-06 DIAGNOSIS — I255 Ischemic cardiomyopathy: Secondary | ICD-10-CM | POA: Diagnosis not present

## 2024-03-06 DIAGNOSIS — I3481 Nonrheumatic mitral (valve) annulus calcification: Secondary | ICD-10-CM | POA: Diagnosis not present

## 2024-03-06 DIAGNOSIS — Z79899 Other long term (current) drug therapy: Secondary | ICD-10-CM | POA: Diagnosis not present

## 2024-03-06 DIAGNOSIS — Z951 Presence of aortocoronary bypass graft: Secondary | ICD-10-CM | POA: Diagnosis not present

## 2024-03-06 DIAGNOSIS — Z96612 Presence of left artificial shoulder joint: Secondary | ICD-10-CM | POA: Diagnosis not present

## 2024-03-06 DIAGNOSIS — I130 Hypertensive heart and chronic kidney disease with heart failure and stage 1 through stage 4 chronic kidney disease, or unspecified chronic kidney disease: Secondary | ICD-10-CM | POA: Diagnosis not present

## 2024-03-06 DIAGNOSIS — Z7985 Long-term (current) use of injectable non-insulin antidiabetic drugs: Secondary | ICD-10-CM | POA: Diagnosis not present

## 2024-03-06 DIAGNOSIS — I519 Heart disease, unspecified: Secondary | ICD-10-CM | POA: Diagnosis not present

## 2024-03-06 DIAGNOSIS — I4891 Unspecified atrial fibrillation: Secondary | ICD-10-CM | POA: Diagnosis not present

## 2024-03-06 DIAGNOSIS — E1122 Type 2 diabetes mellitus with diabetic chronic kidney disease: Secondary | ICD-10-CM | POA: Diagnosis not present

## 2024-03-06 DIAGNOSIS — I5020 Unspecified systolic (congestive) heart failure: Secondary | ICD-10-CM | POA: Diagnosis not present

## 2024-03-06 DIAGNOSIS — Z794 Long term (current) use of insulin: Secondary | ICD-10-CM | POA: Diagnosis not present

## 2024-03-06 DIAGNOSIS — N183 Chronic kidney disease, stage 3 unspecified: Secondary | ICD-10-CM | POA: Diagnosis not present

## 2024-03-06 DIAGNOSIS — F17210 Nicotine dependence, cigarettes, uncomplicated: Secondary | ICD-10-CM | POA: Diagnosis not present

## 2024-03-06 DIAGNOSIS — I517 Cardiomegaly: Secondary | ICD-10-CM | POA: Diagnosis not present

## 2024-03-06 DIAGNOSIS — I351 Nonrheumatic aortic (valve) insufficiency: Secondary | ICD-10-CM | POA: Diagnosis not present

## 2024-03-06 DIAGNOSIS — Z7982 Long term (current) use of aspirin: Secondary | ICD-10-CM | POA: Diagnosis not present

## 2024-03-06 DIAGNOSIS — N1831 Chronic kidney disease, stage 3a: Secondary | ICD-10-CM | POA: Diagnosis not present

## 2024-03-06 DIAGNOSIS — I2510 Atherosclerotic heart disease of native coronary artery without angina pectoris: Secondary | ICD-10-CM | POA: Diagnosis not present

## 2024-03-06 DIAGNOSIS — F17200 Nicotine dependence, unspecified, uncomplicated: Secondary | ICD-10-CM | POA: Diagnosis not present

## 2024-03-07 DIAGNOSIS — I480 Paroxysmal atrial fibrillation: Secondary | ICD-10-CM | POA: Diagnosis not present

## 2024-03-07 DIAGNOSIS — Z96612 Presence of left artificial shoulder joint: Secondary | ICD-10-CM | POA: Diagnosis not present

## 2024-03-07 DIAGNOSIS — G4733 Obstructive sleep apnea (adult) (pediatric): Secondary | ICD-10-CM | POA: Diagnosis not present

## 2024-03-07 DIAGNOSIS — Z85828 Personal history of other malignant neoplasm of skin: Secondary | ICD-10-CM | POA: Diagnosis not present

## 2024-03-07 DIAGNOSIS — Z556 Problems related to health literacy: Secondary | ICD-10-CM | POA: Diagnosis not present

## 2024-03-07 DIAGNOSIS — E1169 Type 2 diabetes mellitus with other specified complication: Secondary | ICD-10-CM | POA: Diagnosis not present

## 2024-03-07 DIAGNOSIS — Z7982 Long term (current) use of aspirin: Secondary | ICD-10-CM | POA: Diagnosis not present

## 2024-03-07 DIAGNOSIS — Z951 Presence of aortocoronary bypass graft: Secondary | ICD-10-CM | POA: Diagnosis not present

## 2024-03-07 DIAGNOSIS — Z794 Long term (current) use of insulin: Secondary | ICD-10-CM | POA: Diagnosis not present

## 2024-03-07 DIAGNOSIS — I5022 Chronic systolic (congestive) heart failure: Secondary | ICD-10-CM | POA: Diagnosis not present

## 2024-03-07 DIAGNOSIS — I110 Hypertensive heart disease with heart failure: Secondary | ICD-10-CM | POA: Diagnosis not present

## 2024-03-07 DIAGNOSIS — Z471 Aftercare following joint replacement surgery: Secondary | ICD-10-CM | POA: Diagnosis not present

## 2024-03-07 DIAGNOSIS — M546 Pain in thoracic spine: Secondary | ICD-10-CM | POA: Diagnosis not present

## 2024-03-07 DIAGNOSIS — I252 Old myocardial infarction: Secondary | ICD-10-CM | POA: Diagnosis not present

## 2024-03-07 DIAGNOSIS — E785 Hyperlipidemia, unspecified: Secondary | ICD-10-CM | POA: Diagnosis not present

## 2024-03-07 DIAGNOSIS — J449 Chronic obstructive pulmonary disease, unspecified: Secondary | ICD-10-CM | POA: Diagnosis not present

## 2024-03-07 DIAGNOSIS — D696 Thrombocytopenia, unspecified: Secondary | ICD-10-CM | POA: Diagnosis not present

## 2024-03-07 DIAGNOSIS — Z7984 Long term (current) use of oral hypoglycemic drugs: Secondary | ICD-10-CM | POA: Diagnosis not present

## 2024-03-07 DIAGNOSIS — Z9181 History of falling: Secondary | ICD-10-CM | POA: Diagnosis not present

## 2024-03-07 DIAGNOSIS — E7800 Pure hypercholesterolemia, unspecified: Secondary | ICD-10-CM | POA: Diagnosis not present

## 2024-03-08 ENCOUNTER — Ambulatory Visit (INDEPENDENT_AMBULATORY_CARE_PROVIDER_SITE_OTHER)

## 2024-03-08 VITALS — BP 116/66 | Ht 65.0 in | Wt 179.4 lb

## 2024-03-08 DIAGNOSIS — Z0000 Encounter for general adult medical examination without abnormal findings: Secondary | ICD-10-CM

## 2024-03-08 DIAGNOSIS — Z1211 Encounter for screening for malignant neoplasm of colon: Secondary | ICD-10-CM | POA: Diagnosis not present

## 2024-03-08 NOTE — Patient Instructions (Signed)
 [name] , Thank you for taking time out of your busy schedule to complete your Annual Wellness Visit with me. I enjoyed our conversation and look forward to speaking with you again next year. I, as well as your care team,  appreciate your ongoing commitment to your health goals. Please review the following plan we discussed and let me know if I can assist you in the future. Your Game plan/ To Do List    Referrals: If you haven't heard from the office you've been referred to, please reach out to them at the phone provided.  Colonoscopy: [facility], [place]   Phone: [phone]   Follow up Visits: Next Medicare AWV with our clinical staff: 03/09/25 @ 1pm in person   Have you seen your provider in the last 6 months (3 months if uncontrolled diabetes)? Yes Next Office Visit with your provider: 05/31/24 PE  Clinician Recommendations:  Aim for 30 minutes of exercise or brisk walking, 6-8 glasses of water , and 5 servings of fruits and vegetables each day.       This is a list of the screening recommended for you and due dates:  Health Maintenance  Topic Date Due   Zoster (Shingles) Vaccine (1 of 2) 06/06/1980   Colon Cancer Screening  02/13/2023   Eye exam for diabetics  07/04/2023   Screening for Lung Cancer  01/04/2024   Hemoglobin A1C  02/27/2024   Yearly kidney health urinalysis for diabetes  05/10/2024   Complete foot exam   05/10/2024   Flu Shot  05/19/2024   Yearly kidney function blood test for diabetes  02/10/2025   Medicare Annual Wellness Visit  03/08/2025   DTaP/Tdap/Td vaccine (2 - Td or Tdap) 05/30/2031   Pneumococcal Vaccination  Completed   Hepatitis C Screening  Completed   HIV Screening  Completed   HPV Vaccine  Aged Out   Meningitis B Vaccine  Aged Out   COVID-19 Vaccine  Discontinued    Advanced directives: (In Chart) A copy of your advanced directives are scanned into your chart should your provider ever need  it. Advance Care Planning is important because it:  [x]  Makes sure you receive the medical care that is consistent with your values, goals, and preferences  [x]  It provides guidance to your family and loved ones and reduces their decisional burden about whether or not they are making the right decisions based on your wishes.  Follow the link provided in your after visit summary or read over the paperwork we have mailed to you to help you started getting your Advance Directives in place. If you need assistance in completing these, please reach out to us  so that we can help you!

## 2024-03-08 NOTE — Progress Notes (Signed)
 Subjective:   [name] is a 63 y.o. who presents for a Medicare Wellness preventive visit.  As a reminder, Annual Wellness Visits don't include a physical exam, and some assessments may be limited, especially if this visit is performed virtually. We may recommend an in-person follow-up visit with your provider if needed.  Visit Complete: In person  Persons Participating in Visit: Patient assisted by daughter Hildy Lowers.  AWV Questionnaire: No: Patient Medicare AWV questionnaire was not completed prior to this visit.  Cardiac Risk Factors include: advanced age (>32men, >47 women);diabetes mellitus;dyslipidemia;male gender;hypertension;sedentary lifestyle;smoking/ tobacco exposure     Objective:     Today's Vitals   03/08/24 1306  BP: 116/66  Weight: 179 lb 6.4 oz (81.4 kg)  Height: 5\' 5"  (1.651 m)  PainSc: 6    Body mass index is 29.85 kg/m.     03/08/2024    1:19 PM 03/02/2024    9:11 AM 02/25/2024   12:38 PM 11/02/2023    5:52 PM 08/10/2022    3:16 PM 07/31/2022    6:00 PM 07/31/2022    5:00 PM  Advanced Directives  Does Patient Have a Medical Advance Directive? Yes Yes Yes No Yes  Yes  Type of Estate agent of [place];Living will Healthcare Power of [employer] [place];Living will Healthcare Power of Attorney   Does patient want to make changes to medical advance directive?     No - Patient declined Yes (Inpatient - patient requests chaplain consult to change a medical advance directive)   Copy of Healthcare Power of Attorney in Chart? No - copy requested Yes - validated most recent copy scanned in chart (See row information)   Yes - validated most recent copy scanned in chart (See row information)      Current Medications (verified) Outpatient Encounter Medications as of 03/08/2024  Medication Sig   acetaminophen  (TYLENOL ) 500 MG tablet Take 1,000 mg by mouth every 6 (six) hours as needed for headache.   albuterol  (VENTOLIN   HFA) 108 (90 Base) MCG/ACT inhaler Inhale 1-2 puffs into the lungs every 6 (six) hours as needed for wheezing or shortness of breath.   aspirin  81 MG chewable tablet Chew 1 tablet (81 mg total) by mouth daily.   atorvastatin  (LIPITOR ) 80 MG tablet Take 1 tablet (80 mg total) by mouth at bedtime.   BREZTRI  AEROSPHERE 160-9-4.8 MCG/ACT AERO inhaler INHALE 2 PUFFS IN THE MORNING AND AT BEDTIME   cetirizine  (ZYRTEC ) 10 MG tablet Take 1 tablet (10 mg total) by mouth every morning.   citalopram  (CELEXA ) 10 MG tablet Take 10 mg by mouth every morning. Take with 20 to equal 30 mg daily   citalopram  (CELEXA ) 20 MG tablet Take 20 mg by mouth every morning. Take with 10 to equal 30 mg daily   furosemide  (LASIX ) 20 MG tablet Take 1 tablet (20 mg total) by mouth daily as needed.   glucose blood test strip 1 each by Other route as needed for other. For one touch ultra meter. DX: E11.9   HUMALOG KWIKPEN 100 UNIT/ML KwikPen Inject 20 Units into the skin 3 (three) times daily with meals.   Insulin  Pen Needle 32G X 4 MM MISC Use to inject Insulin  daily. Dx: E11.9   Insulin  Syringe-Needle U-100 (INSULIN  SYRINGE .3CC/31GX5/16") 31G X 5/16" 0.3 ML MISC 1 Units by Does not apply route 2 (two) times daily.   Lancets (ONETOUCH ULTRASOFT) lancets Use as instructed   LANTUS  SOLOSTAR 100 UNIT/ML Solostar  Pen Inject 32 Units into the skin 2 (two) times daily.   metoprolol  succinate (TOPROL -XL) 100 MG 24 hr tablet Take 100 mg by mouth every morning.   oxyCODONE  (ROXICODONE ) 5 MG immediate release tablet Take 1-2 tablets (5-10 mg total) by mouth every 4 (four) hours as needed for moderate pain (pain score 4-6) or severe pain (pain score 7-10).   pregabalin  (LYRICA ) 75 MG capsule TAKE 1 CAPSULE BY MOUTH TWICE DAILY   rOPINIRole  (REQUIP ) 0.5 MG tablet TAKE 1 TABLET BY MOUTH THREE TIMES DAILY   sacubitril -valsartan  (ENTRESTO ) 24-26 MG Take 1 tablet by mouth daily. 0.5 tablet daily   sitaGLIPtin (JANUVIA) 100 MG tablet Take 1  tablet by mouth daily.   tirzepatide (MOUNJARO) 2.5 MG/0.5ML Pen Inject 2.5 mg into the skin every Wednesday.   ezetimibe  (ZETIA ) 10 MG tablet Take 1 tablet by mouth every morning.   nitroGLYCERIN  (NITROSTAT ) 0.4 MG SL tablet Place 1 tablet (0.4 mg total) under the tongue every 5 (five) minutes as needed for chest pain.   No facility-administered encounter medications on file as of 03/08/2024.    Allergies (verified) Latex and Lactose intolerance (gi)   History: Past Medical History:  Diagnosis Date   Aortic atherosclerosis (HCC)    Atrial fibrillation (HCC)    a.) initially occurring in post-op setting in 05/2016, started on Eliquis ; b.) s/p LAA closure at time of CABG 08/24/2016 --> Eliquis  switched to Coumadin ; c.) CHA2DS2VASc = 4 (HFrEF, HTN, prior MI/vascular disease, T2DM) as of 02/29/2024; d.) s/p RFCA 12/20/2020; e.) rate/rhythm maintained on oral metoprolol  succinate; no OAC at this time   Basal cell carcinoma of skin    CAD (coronary artery disease)    a. 05/2016: NSTEMI, cath showing 3v disease with CABG recommended once MRSA infection resolved. b. 08/2016: CABG with LIMA-LAD, SVG-D1, and SVG-RI.   Cardiomegaly    CKD (chronic kidney disease)    Complication of anesthesia    tachycardia, rash, delayed emergence after cagb-pt aspirated after cyst removal   COPD (chronic obstructive pulmonary disease) (HCC)    DM (diabetes mellitus), type 2 (HCC)    Essential tremor    Gastroparesis    Headache(784.0)    Hematuria, microscopic "SINCE I WAS A KID"   HFrEF (heart failure with reduced ejection fraction) (HCC)    Hyperlipidemia    Hypertension    Ischemic cardiomyopathy    a. 05/2016: echo w/ EF of 25-35% and akinesis of the basal-midinferolateral and inferior myocardium.   Long-term use of aspirin  therapy    NSTEMI (non-ST elevated myocardial infarction) (HCC) 06/11/2016   Osteomyelitis of left foot (HCC)    Paresthesia and pain of both upper extremities    Peripheral  neuropathy    Persistent depressive disorder    Pulmonary hypertension (HCC)    Respiratory failure (HCC)    RLS (restless legs syndrome)    a.) on ropinirole    Rotator cuff tear (BILATERAL)    S/P CABG x 3 08/24/2016   a.) LIMA-LAD, SVG-D1, SVG-RI.   S/P left atrial appendage ligation 08/24/2016   Sleep apnea    a.) unable to tolerate mask required for nocturnal PAP therapy   Thrombocytopenia (HCC)    Tobacco use    Past Surgical History:  Procedure Laterality Date   AMPUTATION TOE Left 02/06/2020   5th ray amputation with graph [facility])   BRONCHIAL BRUSHINGS  08/01/2022   Procedure: BRONCHIAL BRUSHINGS;  Surgeon: Josiah Nigh, MD;  Location: [facility] ENDOSCOPY;  Service: Pulmonary;;   BRONCHIAL WASHINGS  08/01/2022   Procedure: BRONCHIAL WASHINGS;  Surgeon: Josiah Nigh, MD;  Location: [facility] ENDOSCOPY;  Service: Pulmonary;;   CARDIAC CATHETERIZATION N/A 06/16/2016   Procedure: Left Heart Cath and Coronary Angiography;  Surgeon: Millicent Ally, MD;  Location: [facility] INVASIVE CV LAB;  Service: Cardiovascular;  Laterality: N/A;   CLIPPING OF ATRIAL APPENDAGE N/A 08/24/2016   Procedure: CLIPPING OF ATRIAL APPENDAGE;  Surgeon: Heriberto London, MD;  Location: [facility] OR;  Service: Open Heart Surgery;  Laterality: N/A;   COLONOSCOPY WITH PROPOFOL  N/A 02/12/2022   Procedure: COLONOSCOPY WITH PROPOFOL ;  Surgeon: Marnee Sink, MD;  Location: [facility] ENDOSCOPY;  Service: Endoscopy;  Laterality: N/A;   CORONARY ARTERY BYPASS GRAFT N/A 08/24/2016   Procedure: CORONARY ARTERY BYPASS GRAFTING (CABG) x three,  using left internal mammary artery and right leg greater saphenous vein harvested endoscopically;  Surgeon: Heriberto London, MD;  [facility];  Service: Open Heart Surgery;  Laterality: N/A;   HERNIA REPAIR  X2   INCISION AND DRAINAGE ABSCESS Left 06/10/2016   Procedure: INCISION AND DRAINAGE LEFT AXILLARY ABSCESS;  Surgeon: Lillette Reid III, MD;  Location: [facility] ORS;  Service: General;  Laterality: Left;    REVERSE SHOULDER ARTHROPLASTY Left 03/02/2024   Procedure: ARTHROPLASTY, SHOULDER, TOTAL, REVERSE;  Surgeon: Elner Hahn, MD;  Location: [facility] ORS;  Service: Orthopedics;  Laterality: Left;   ROTATOR CUFF REPAIR Bilateral    TEE WITHOUT CARDIOVERSION N/A 08/24/2016   Procedure: TRANSESOPHAGEAL ECHOCARDIOGRAM (TEE);  Surgeon: Heriberto London, MD;  Location: [facility] OR;  Service: Open Heart Surgery;  Laterality: N/A;   VIDEO BRONCHOSCOPY Right 08/01/2022   Procedure: VIDEO BRONCHOSCOPY WITH FLUORO;  Surgeon: Josiah Nigh, MD;  Location: [facility] ENDOSCOPY;  Service: Pulmonary;  Laterality: Right;   Family History  Problem Relation Age of Onset   Hypertension Mother    Breast cancer Mother    Hypertension Father    Sudden death Father 32       at age of death   Diabetes Father    Heart attack Father    Heart attack Sister    Cancer Maternal Aunt        breast x2 aunts   Social History   Socioeconomic History   Marital status: Married    Spouse name: Not on file   Number of children: Not on file   Years of education: Not on file   Highest education level: Not on file  Occupational History   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  Tobacco Use   Smoking status: Every Day    Current packs/day: 1.50    Average packs/day: 1.5 packs/day for 42.0 years (63.0 ttl pk-yrs)    Types: Cigarettes   Smokeless tobacco: Never   Tobacco comments:    Currently 1.5 pk a day 02/21/2024 hfb RN  Vaping Use   Vaping status: Never Used  Substance and Sexual Activity   Alcohol use: No   Drug use: No   Sexual activity: Not on file  Other Topics Concern   Not on file  Social History Narrative   Not on file   Social Drivers of Health   Financial Resource Strain: Low Risk  (03/08/2024)   Overall Financial Resource Strain (CARDIA)    Difficulty of Paying Living Expenses: Not hard at all  Food Insecurity: No Food Insecurity (03/08/2024)   Hunger Vital Sign    Worried About Running Out of  Food in the Last Year: Never true    Ran  Out of Food in the Last Year: Never true  Transportation Needs: No Transportation Needs (03/08/2024)   PRAPARE - Administrator, Civil Service (Medical): No    Lack of Transportation (Non-Medical): No  Physical Activity: Inactive (03/08/2024)   Exercise Vital Sign    Days of Exercise per Week: 0 days    Minutes of Exercise per Session: 0 min  Stress: No Stress Concern Present (03/08/2024)   [employer] of Occupational Health - Occupational Stress Questionnaire    Feeling of Stress : Only a little  Social Connections: Socially Isolated (03/08/2024)   Social Connection and Isolation Panel [NHANES]    Frequency of Communication with Friends and Family: More than three times a week    Frequency of Social Gatherings with Friends and Family: More than three times a week    Attends Religious Services: Never    Database administrator or Organizations: No    Attends Banker Meetings: Never    Marital Status: Separated    Tobacco Counseling Ready to quit: Not Answered Counseling given: Not Answered Tobacco comments: Currently 1.5 pk a day 02/21/2024 hfb RN    Clinical Intake:  Pre-visit preparation completed: Yes  Pain Score: 6      BMI - recorded: 29.85 Nutritional Status: BMI 25 -29 Overweight Nutritional Risks: None Diabetes: Yes CBG done?: Yes (BS 114 while in office) CBG resulted in Enter/ Edit results?: No Did pt. bring in CBG monitor from home?: No  Lab Results  Component Value Date   HGBA1C 7.3 08/30/2023   HGBA1C 6.1 11/30/2022   HGBA1C 7.0 07/13/2022     How often do you need to have someone help you when you read instructions, pamphlets, or other written materials from your doctor or pharmacy?: 1 - Never  Interpreter Needed?: No      Activities of Daily Living     03/08/2024    1:19 PM 02/25/2024   11:33 AM  In your present state of health, do you have any difficulty performing the  following activities:  Hearing? 0   Vision? 0   Difficulty concentrating or making decisions? 0   Walking or climbing stairs? 0   Dressing or bathing? 0   Doing errands, shopping? 1 0  Preparing Food and eating ? N   Using the Toilet? N   In the past six months, have you accidently leaked urine? N   Do you have problems with loss of bowel control? Y   Managing your Medications? Y   Managing your Finances? Y   Housekeeping or managing your Housekeeping? Y     Patient Care Team: Dorothe Gaster, NP as PCP - General Manson Seitz, MD (Inactive) as Consulting Physician (Internal Medicine) Chancey Combe Paula E, MD as Consulting Physician (Ophthalmology)  Indicate any recent Medical Services you may have received from other than [facility] providers in the past year (date may be approximate).     Assessment:    This is a routine wellness examination for Dacari.  Hearing/Vision screen Hearing Screening - Comments:: Pt says hearing is pretty good Vision Screening - Comments:: Pt says his vision is good after laser surgery; readers only   Goals Addressed             This Visit's Progress    DIET - EAT MORE FRUITS AND VEGETABLES   Not on track    More vegetables :not fruits       Depression Screen     03/08/2024  1:15 PM 02/11/2024   11:50 AM 08/03/2023   10:13 AM 04/15/2023    2:39 PM 02/09/2023    9:53 AM 11/04/2022   12:31 PM 08/10/2022    3:14 PM  PHQ 2/9 Scores  PHQ - 2 Score 2 0 3 2 2 4 1   PHQ- 9 Score 7 0 13 11 13 15 3     Fall Risk     03/08/2024    1:12 PM 02/21/2024   11:03 AM 02/11/2024   11:51 AM 02/11/2024   11:47 AM 08/03/2023   10:14 AM  Fall Risk   Falls in the past year? 1 1 0 1 1  Number falls in past yr: 1 1 0 1 1  Injury with Fall? 1 1 0 1 0  Comment fell on shoulder: reset surgery      Risk for fall due to : Impaired balance/gait Other (Comment);Impaired mobility;Impaired balance/gait No Fall Risks History of fall(s) Other (Comment);No Fall Risks  Risk for  fall due to: Comment neuropathy in feet      Follow up Education provided;Falls prevention discussed  Falls evaluation completed Falls evaluation completed Falls evaluation completed    MEDICARE RISK AT HOME:  Medicare Risk at Home Any stairs in or around the home?: Yes If so, are there any without handrails?: Yes Home free of loose throw rugs in walkways, pet beds, electrical cords, etc?: Yes Adequate lighting in your home to reduce risk of falls?: Yes Life alert?: No Use of a cane, walker or w/c?: Yes Grab bars in the bathroom?: No Shower chair or bench in shower?: No Elevated toilet seat or a handicapped toilet?: No  TIMED UP AND GO:  Was the test performed?  Yes  Length of time to ambulate 10 feet: 12 sec Gait slow and steady without use of assistive device  Cognitive Function: 6CIT completed        03/08/2024    1:21 PM 08/10/2022    3:19 PM  6CIT Screen  What Year? 0 points 0 points  What month? 0 points 0 points  What time? 0 points 0 points  Count back from 20 0 points 0 points  Months in reverse 0 points 0 points  Repeat phrase 0 points 0 points  Total Score 0 points 0 points    Immunizations Immunization History  Administered Date(s) Administered   Influenza,inj,quad, With Preservative 09/21/2019   MODERNA COVID-19 SARS-COV-2 PEDS BIVALENT BOOSTER 20yr-42yr 01/24/2020, 02/21/2020, 10/23/2020   PNEUMOCOCCAL CONJUGATE-20 10/29/2021   Pneumococcal Polysaccharide-23 09/26/2019   Tdap 05/29/2021   Zoster, Live 05/30/2021    Screening Tests Health Maintenance  Topic Date Due   Zoster Vaccines- Shingrix (1 of 2) 06/06/1980   Colonoscopy  02/13/2023   OPHTHALMOLOGY EXAM  07/04/2023   Lung Cancer Screening  01/04/2024   HEMOGLOBIN A1C  02/27/2024   Diabetic kidney evaluation - Urine ACR  05/10/2024   FOOT EXAM  05/10/2024   INFLUENZA VACCINE  05/19/2024   Diabetic kidney evaluation - eGFR measurement  02/10/2025   Medicare Annual Wellness (AWV)  03/08/2025    DTaP/Tdap/Td (2 - Td or Tdap) 05/30/2031   Pneumococcal Vaccine 5-22 Years old  Completed   Hepatitis C Screening  Completed   HIV Screening  Completed   HPV VACCINES  Aged Out   Meningococcal B Vaccine  Aged Out   COVID-19 Vaccine  Discontinued    Health Maintenance  Health Maintenance Due  Topic Date Due   Zoster Vaccines- Shingrix (1 of 2) 06/06/1980  Colonoscopy  02/13/2023   OPHTHALMOLOGY EXAM  07/04/2023   Lung Cancer Screening  01/04/2024   HEMOGLOBIN A1C  02/27/2024   Health Maintenance Items Addressed: Referral sent to GI for colonoscopy Vaccinations: pt declines: Influenza vaccine: recommend every Fall Shingles vaccine: recommend Shingrix which is 2 doses 2-6 months apart and over 90% effective     Covid-19: recommend 2 doses one month apart with a booster 6 months later   Additional Screening:  Vision Screening: Recommended annual ophthalmology exams for early detection of glaucoma and other disorders of the eye.  Dental Screening: Recommended annual dental exams for proper oral hygiene  Community Resource Referral / Chronic Care Management: CRR required this visit?  No   CCM required this visit?  Appt scheduled with PCP and Lab   Plan:    I have personally reviewed and noted the following in the patient's chart:   Medical and social history Use of alcohol, tobacco or illicit drugs  Current medications and supplements including opioid prescriptions. Patient is currently taking opioid prescriptions. Information provided to patient regarding non-opioid alternatives. Patient advised to discuss non-opioid treatment plan with their provider. Functional ability and status Nutritional status Physical activity Advanced directives List of other physicians Hospitalizations, surgeries, and ER visits in previous 12 months Vitals Screenings to include cognitive, depression, and falls Referrals and appointments  In addition, I have reviewed and discussed with  patient certain preventive protocols, quality metrics, and best practice recommendations. A written personalized care plan for preventive services as well as general preventive health recommendations were provided to patient.   Nerissa Bannister, LPN   01/09/4009   After Visit Summary: (MyChart) Due to this being a telephonic visit, the after visit summary with patients personalized plan was offered to patient via MyChart   Notes: Nothing significant to report at this time.

## 2024-03-09 DIAGNOSIS — I480 Paroxysmal atrial fibrillation: Secondary | ICD-10-CM | POA: Diagnosis not present

## 2024-03-09 DIAGNOSIS — I5022 Chronic systolic (congestive) heart failure: Secondary | ICD-10-CM | POA: Diagnosis not present

## 2024-03-09 DIAGNOSIS — J449 Chronic obstructive pulmonary disease, unspecified: Secondary | ICD-10-CM | POA: Diagnosis not present

## 2024-03-09 DIAGNOSIS — Z794 Long term (current) use of insulin: Secondary | ICD-10-CM | POA: Diagnosis not present

## 2024-03-09 DIAGNOSIS — Z96612 Presence of left artificial shoulder joint: Secondary | ICD-10-CM | POA: Diagnosis not present

## 2024-03-09 DIAGNOSIS — M546 Pain in thoracic spine: Secondary | ICD-10-CM | POA: Diagnosis not present

## 2024-03-09 DIAGNOSIS — I110 Hypertensive heart disease with heart failure: Secondary | ICD-10-CM | POA: Diagnosis not present

## 2024-03-09 DIAGNOSIS — Z7982 Long term (current) use of aspirin: Secondary | ICD-10-CM | POA: Diagnosis not present

## 2024-03-09 DIAGNOSIS — E1169 Type 2 diabetes mellitus with other specified complication: Secondary | ICD-10-CM | POA: Diagnosis not present

## 2024-03-09 DIAGNOSIS — E785 Hyperlipidemia, unspecified: Secondary | ICD-10-CM | POA: Diagnosis not present

## 2024-03-09 DIAGNOSIS — G4733 Obstructive sleep apnea (adult) (pediatric): Secondary | ICD-10-CM | POA: Diagnosis not present

## 2024-03-09 DIAGNOSIS — Z9181 History of falling: Secondary | ICD-10-CM | POA: Diagnosis not present

## 2024-03-09 DIAGNOSIS — Z556 Problems related to health literacy: Secondary | ICD-10-CM | POA: Diagnosis not present

## 2024-03-09 DIAGNOSIS — Z85828 Personal history of other malignant neoplasm of skin: Secondary | ICD-10-CM | POA: Diagnosis not present

## 2024-03-09 DIAGNOSIS — I252 Old myocardial infarction: Secondary | ICD-10-CM | POA: Diagnosis not present

## 2024-03-09 DIAGNOSIS — Z7984 Long term (current) use of oral hypoglycemic drugs: Secondary | ICD-10-CM | POA: Diagnosis not present

## 2024-03-09 DIAGNOSIS — D696 Thrombocytopenia, unspecified: Secondary | ICD-10-CM | POA: Diagnosis not present

## 2024-03-09 DIAGNOSIS — E7800 Pure hypercholesterolemia, unspecified: Secondary | ICD-10-CM | POA: Diagnosis not present

## 2024-03-09 DIAGNOSIS — Z471 Aftercare following joint replacement surgery: Secondary | ICD-10-CM | POA: Diagnosis not present

## 2024-03-09 DIAGNOSIS — Z951 Presence of aortocoronary bypass graft: Secondary | ICD-10-CM | POA: Diagnosis not present

## 2024-03-14 DIAGNOSIS — I252 Old myocardial infarction: Secondary | ICD-10-CM | POA: Diagnosis not present

## 2024-03-14 DIAGNOSIS — E7800 Pure hypercholesterolemia, unspecified: Secondary | ICD-10-CM | POA: Diagnosis not present

## 2024-03-14 DIAGNOSIS — Z556 Problems related to health literacy: Secondary | ICD-10-CM | POA: Diagnosis not present

## 2024-03-14 DIAGNOSIS — I480 Paroxysmal atrial fibrillation: Secondary | ICD-10-CM | POA: Diagnosis not present

## 2024-03-14 DIAGNOSIS — E785 Hyperlipidemia, unspecified: Secondary | ICD-10-CM | POA: Diagnosis not present

## 2024-03-14 DIAGNOSIS — Z471 Aftercare following joint replacement surgery: Secondary | ICD-10-CM | POA: Diagnosis not present

## 2024-03-14 DIAGNOSIS — Z7984 Long term (current) use of oral hypoglycemic drugs: Secondary | ICD-10-CM | POA: Diagnosis not present

## 2024-03-14 DIAGNOSIS — Z7982 Long term (current) use of aspirin: Secondary | ICD-10-CM | POA: Diagnosis not present

## 2024-03-14 DIAGNOSIS — Z9181 History of falling: Secondary | ICD-10-CM | POA: Diagnosis not present

## 2024-03-14 DIAGNOSIS — G4733 Obstructive sleep apnea (adult) (pediatric): Secondary | ICD-10-CM | POA: Diagnosis not present

## 2024-03-14 DIAGNOSIS — Z794 Long term (current) use of insulin: Secondary | ICD-10-CM | POA: Diagnosis not present

## 2024-03-14 DIAGNOSIS — Z96612 Presence of left artificial shoulder joint: Secondary | ICD-10-CM | POA: Diagnosis not present

## 2024-03-14 DIAGNOSIS — Z85828 Personal history of other malignant neoplasm of skin: Secondary | ICD-10-CM | POA: Diagnosis not present

## 2024-03-14 DIAGNOSIS — M546 Pain in thoracic spine: Secondary | ICD-10-CM | POA: Diagnosis not present

## 2024-03-14 DIAGNOSIS — I110 Hypertensive heart disease with heart failure: Secondary | ICD-10-CM | POA: Diagnosis not present

## 2024-03-14 DIAGNOSIS — Z951 Presence of aortocoronary bypass graft: Secondary | ICD-10-CM | POA: Diagnosis not present

## 2024-03-14 DIAGNOSIS — J449 Chronic obstructive pulmonary disease, unspecified: Secondary | ICD-10-CM | POA: Diagnosis not present

## 2024-03-14 DIAGNOSIS — D696 Thrombocytopenia, unspecified: Secondary | ICD-10-CM | POA: Diagnosis not present

## 2024-03-14 DIAGNOSIS — I5022 Chronic systolic (congestive) heart failure: Secondary | ICD-10-CM | POA: Diagnosis not present

## 2024-03-14 DIAGNOSIS — E1169 Type 2 diabetes mellitus with other specified complication: Secondary | ICD-10-CM | POA: Diagnosis not present

## 2024-03-15 DIAGNOSIS — E113412 Type 2 diabetes mellitus with severe nonproliferative diabetic retinopathy with macular edema, left eye: Secondary | ICD-10-CM | POA: Diagnosis not present

## 2024-03-16 DIAGNOSIS — Z471 Aftercare following joint replacement surgery: Secondary | ICD-10-CM | POA: Diagnosis not present

## 2024-03-16 DIAGNOSIS — I480 Paroxysmal atrial fibrillation: Secondary | ICD-10-CM | POA: Diagnosis not present

## 2024-03-16 DIAGNOSIS — G4733 Obstructive sleep apnea (adult) (pediatric): Secondary | ICD-10-CM | POA: Diagnosis not present

## 2024-03-16 DIAGNOSIS — Z96612 Presence of left artificial shoulder joint: Secondary | ICD-10-CM | POA: Diagnosis not present

## 2024-03-16 DIAGNOSIS — E1169 Type 2 diabetes mellitus with other specified complication: Secondary | ICD-10-CM | POA: Diagnosis not present

## 2024-03-16 DIAGNOSIS — Z9181 History of falling: Secondary | ICD-10-CM | POA: Diagnosis not present

## 2024-03-16 DIAGNOSIS — Z7984 Long term (current) use of oral hypoglycemic drugs: Secondary | ICD-10-CM | POA: Diagnosis not present

## 2024-03-16 DIAGNOSIS — M546 Pain in thoracic spine: Secondary | ICD-10-CM | POA: Diagnosis not present

## 2024-03-16 DIAGNOSIS — J449 Chronic obstructive pulmonary disease, unspecified: Secondary | ICD-10-CM | POA: Diagnosis not present

## 2024-03-16 DIAGNOSIS — I5022 Chronic systolic (congestive) heart failure: Secondary | ICD-10-CM | POA: Diagnosis not present

## 2024-03-16 DIAGNOSIS — D696 Thrombocytopenia, unspecified: Secondary | ICD-10-CM | POA: Diagnosis not present

## 2024-03-16 DIAGNOSIS — Z7982 Long term (current) use of aspirin: Secondary | ICD-10-CM | POA: Diagnosis not present

## 2024-03-16 DIAGNOSIS — Z951 Presence of aortocoronary bypass graft: Secondary | ICD-10-CM | POA: Diagnosis not present

## 2024-03-16 DIAGNOSIS — I110 Hypertensive heart disease with heart failure: Secondary | ICD-10-CM | POA: Diagnosis not present

## 2024-03-16 DIAGNOSIS — Z556 Problems related to health literacy: Secondary | ICD-10-CM | POA: Diagnosis not present

## 2024-03-16 DIAGNOSIS — Z85828 Personal history of other malignant neoplasm of skin: Secondary | ICD-10-CM | POA: Diagnosis not present

## 2024-03-16 DIAGNOSIS — I252 Old myocardial infarction: Secondary | ICD-10-CM | POA: Diagnosis not present

## 2024-03-16 DIAGNOSIS — E7800 Pure hypercholesterolemia, unspecified: Secondary | ICD-10-CM | POA: Diagnosis not present

## 2024-03-16 DIAGNOSIS — E785 Hyperlipidemia, unspecified: Secondary | ICD-10-CM | POA: Diagnosis not present

## 2024-03-16 DIAGNOSIS — Z794 Long term (current) use of insulin: Secondary | ICD-10-CM | POA: Diagnosis not present

## 2024-03-17 DIAGNOSIS — Z96612 Presence of left artificial shoulder joint: Secondary | ICD-10-CM | POA: Diagnosis not present

## 2024-03-17 DIAGNOSIS — M25512 Pain in left shoulder: Secondary | ICD-10-CM | POA: Diagnosis not present

## 2024-03-21 ENCOUNTER — Other Ambulatory Visit: Payer: Self-pay | Admitting: Pulmonary Disease

## 2024-03-22 DIAGNOSIS — Z96612 Presence of left artificial shoulder joint: Secondary | ICD-10-CM | POA: Diagnosis not present

## 2024-03-22 DIAGNOSIS — M25512 Pain in left shoulder: Secondary | ICD-10-CM | POA: Diagnosis not present

## 2024-03-27 DIAGNOSIS — R2689 Other abnormalities of gait and mobility: Secondary | ICD-10-CM | POA: Diagnosis not present

## 2024-03-27 DIAGNOSIS — R202 Paresthesia of skin: Secondary | ICD-10-CM | POA: Diagnosis not present

## 2024-03-27 DIAGNOSIS — G250 Essential tremor: Secondary | ICD-10-CM | POA: Diagnosis not present

## 2024-03-27 DIAGNOSIS — M79601 Pain in right arm: Secondary | ICD-10-CM | POA: Diagnosis not present

## 2024-03-27 DIAGNOSIS — M79602 Pain in left arm: Secondary | ICD-10-CM | POA: Diagnosis not present

## 2024-03-29 ENCOUNTER — Encounter: Payer: Self-pay | Admitting: Pulmonary Disease

## 2024-03-30 ENCOUNTER — Other Ambulatory Visit: Payer: Self-pay | Admitting: Pulmonary Disease

## 2024-03-30 DIAGNOSIS — M25512 Pain in left shoulder: Secondary | ICD-10-CM | POA: Diagnosis not present

## 2024-03-30 DIAGNOSIS — E119 Type 2 diabetes mellitus without complications: Secondary | ICD-10-CM | POA: Diagnosis not present

## 2024-03-30 DIAGNOSIS — Z96612 Presence of left artificial shoulder joint: Secondary | ICD-10-CM | POA: Diagnosis not present

## 2024-04-03 DIAGNOSIS — Z471 Aftercare following joint replacement surgery: Secondary | ICD-10-CM | POA: Diagnosis not present

## 2024-04-05 DIAGNOSIS — M25512 Pain in left shoulder: Secondary | ICD-10-CM | POA: Diagnosis not present

## 2024-04-05 DIAGNOSIS — Z96612 Presence of left artificial shoulder joint: Secondary | ICD-10-CM | POA: Diagnosis not present

## 2024-04-10 ENCOUNTER — Other Ambulatory Visit: Payer: Self-pay | Admitting: Acute Care

## 2024-04-10 DIAGNOSIS — Z87891 Personal history of nicotine dependence: Secondary | ICD-10-CM

## 2024-04-10 DIAGNOSIS — F17210 Nicotine dependence, cigarettes, uncomplicated: Secondary | ICD-10-CM

## 2024-04-12 DIAGNOSIS — Z96612 Presence of left artificial shoulder joint: Secondary | ICD-10-CM | POA: Diagnosis not present

## 2024-04-12 DIAGNOSIS — M25512 Pain in left shoulder: Secondary | ICD-10-CM | POA: Diagnosis not present

## 2024-04-14 DIAGNOSIS — Z96612 Presence of left artificial shoulder joint: Secondary | ICD-10-CM | POA: Diagnosis not present

## 2024-04-19 DIAGNOSIS — M25512 Pain in left shoulder: Secondary | ICD-10-CM | POA: Diagnosis not present

## 2024-04-19 DIAGNOSIS — Z96612 Presence of left artificial shoulder joint: Secondary | ICD-10-CM | POA: Diagnosis not present

## 2024-04-20 ENCOUNTER — Ambulatory Visit
Admission: RE | Admit: 2024-04-20 | Discharge: 2024-04-20 | Disposition: A | Discharge status: Home / Self Care | Source: Ambulatory Visit | Attending: Acute Care | Admitting: Acute Care

## 2024-04-20 DIAGNOSIS — F17210 Nicotine dependence, cigarettes, uncomplicated: Secondary | ICD-10-CM | POA: Diagnosis not present

## 2024-04-20 DIAGNOSIS — Z87891 Personal history of nicotine dependence: Secondary | ICD-10-CM | POA: Diagnosis not present

## 2024-04-26 DIAGNOSIS — M25512 Pain in left shoulder: Secondary | ICD-10-CM | POA: Diagnosis not present

## 2024-04-26 DIAGNOSIS — Z96612 Presence of left artificial shoulder joint: Secondary | ICD-10-CM | POA: Diagnosis not present

## 2024-04-29 DIAGNOSIS — E119 Type 2 diabetes mellitus without complications: Secondary | ICD-10-CM | POA: Diagnosis not present

## 2024-05-02 ENCOUNTER — Other Ambulatory Visit: Payer: Self-pay

## 2024-05-02 DIAGNOSIS — Z122 Encounter for screening for malignant neoplasm of respiratory organs: Secondary | ICD-10-CM

## 2024-05-02 DIAGNOSIS — F17210 Nicotine dependence, cigarettes, uncomplicated: Secondary | ICD-10-CM

## 2024-05-02 DIAGNOSIS — Z87891 Personal history of nicotine dependence: Secondary | ICD-10-CM

## 2024-05-03 DIAGNOSIS — M25512 Pain in left shoulder: Secondary | ICD-10-CM | POA: Diagnosis not present

## 2024-05-03 DIAGNOSIS — Z96612 Presence of left artificial shoulder joint: Secondary | ICD-10-CM | POA: Diagnosis not present

## 2024-05-08 ENCOUNTER — Other Ambulatory Visit: Payer: Self-pay | Admitting: Pulmonary Disease

## 2024-05-09 NOTE — Telephone Encounter (Signed)
**Note De-identified  [place] Obfuscation** Please advise 

## 2024-05-10 DIAGNOSIS — Z96612 Presence of left artificial shoulder joint: Secondary | ICD-10-CM | POA: Diagnosis not present

## 2024-05-10 DIAGNOSIS — M25612 Stiffness of left shoulder, not elsewhere classified: Secondary | ICD-10-CM | POA: Diagnosis not present

## 2024-05-17 ENCOUNTER — Ambulatory Visit (INDEPENDENT_AMBULATORY_CARE_PROVIDER_SITE_OTHER): Admitting: Podiatry

## 2024-05-17 VITALS — Ht 65.0 in | Wt 179.4 lb

## 2024-05-17 DIAGNOSIS — M79676 Pain in unspecified toe(s): Secondary | ICD-10-CM

## 2024-05-17 DIAGNOSIS — B351 Tinea unguium: Secondary | ICD-10-CM

## 2024-05-17 DIAGNOSIS — L84 Corns and callosities: Secondary | ICD-10-CM | POA: Diagnosis not present

## 2024-05-17 DIAGNOSIS — H401131 Primary open-angle glaucoma, bilateral, mild stage: Secondary | ICD-10-CM | POA: Diagnosis not present

## 2024-05-17 DIAGNOSIS — E1142 Type 2 diabetes mellitus with diabetic polyneuropathy: Secondary | ICD-10-CM | POA: Diagnosis not present

## 2024-05-17 DIAGNOSIS — M79609 Pain in unspecified limb: Secondary | ICD-10-CM | POA: Diagnosis not present

## 2024-05-17 DIAGNOSIS — E113412 Type 2 diabetes mellitus with severe nonproliferative diabetic retinopathy with macular edema, left eye: Secondary | ICD-10-CM | POA: Diagnosis not present

## 2024-05-17 NOTE — Progress Notes (Signed)
  Subjective:  Patient ID: [name], male    DOB: [birth year],  MRN: [id number]  Chief Complaint  Patient presents with   Nail Problem    RM 3 Patient is here for routine foot care and nail trimming. Patient has no additional concerns today.    63 y.o. male presents with the above complaint. History confirmed with patient.  He returns for follow-up.  Nails painful thick and elongated, the callus on the fifth metatarsal right foot has built up again and causing pain.  Started out 2 to 3 weeks ago with pain.  Objective:  Physical Exam: warm, good capillary refill, no trophic changes or ulcerative lesions, normal DP and PT pulses, and he has an abnormal sensory exam with loss of protective sensation. Left Foot: dystrophic yellowed discolored nail plates with subungual debris Right Foot: dystrophic yellowed discolored nail plates with subungual debris and plantar fifth metatarsal there is a painful hyperkeratosis  Radiographs of both feet taken today show no osteomyelitis of the areas of concern of the fifth metatarsal on the right foot or left fourth toe  Assessment:   1. Pain due to onychomycosis of nail   2. Callus   3. Type 2 diabetes mellitus with peripheral neuropathy (HCC)       Plan:  Patient was evaluated and treated and all questions answered.  Discussed the etiology and treatment options for the condition in detail with the patient. Prior debridements have been helpful. Recommended debridement of the nails today. Sharp and mechanical debridement performed of all painful and mycotic nails today. Nails debrided in length and thickness using a nail nipper to level of comfort. Discussed treatment options including appropriate shoe gear. Follow up as needed for painful nails.  All symptomatic hyperkeratoses were safely debrided with a sterile #15 blade to patient's level of comfort without incident.  Regular debridement has been helpful in reducing pain and improving  function    Return in about 3 months (around 08/17/2024) for at risk diabetic foot care.

## 2024-05-19 DIAGNOSIS — Z96612 Presence of left artificial shoulder joint: Secondary | ICD-10-CM | POA: Diagnosis not present

## 2024-05-19 DIAGNOSIS — M25512 Pain in left shoulder: Secondary | ICD-10-CM | POA: Diagnosis not present

## 2024-05-24 ENCOUNTER — Other Ambulatory Visit

## 2024-05-25 ENCOUNTER — Ambulatory Visit (INDEPENDENT_AMBULATORY_CARE_PROVIDER_SITE_OTHER): Admitting: Pulmonary Disease

## 2024-05-25 VITALS — BP 106/68 | HR 68 | Ht 66.0 in | Wt 172.4 lb

## 2024-05-25 DIAGNOSIS — J9611 Chronic respiratory failure with hypoxia: Secondary | ICD-10-CM

## 2024-05-25 DIAGNOSIS — D696 Thrombocytopenia, unspecified: Secondary | ICD-10-CM | POA: Diagnosis not present

## 2024-05-25 DIAGNOSIS — J189 Pneumonia, unspecified organism: Secondary | ICD-10-CM

## 2024-05-25 DIAGNOSIS — G4733 Obstructive sleep apnea (adult) (pediatric): Secondary | ICD-10-CM | POA: Diagnosis not present

## 2024-05-25 DIAGNOSIS — L97514 Non-pressure chronic ulcer of other part of right foot with necrosis of bone: Secondary | ICD-10-CM

## 2024-05-25 DIAGNOSIS — J449 Chronic obstructive pulmonary disease, unspecified: Secondary | ICD-10-CM | POA: Diagnosis not present

## 2024-05-25 DIAGNOSIS — I480 Paroxysmal atrial fibrillation: Secondary | ICD-10-CM

## 2024-05-25 DIAGNOSIS — J8489 Other specified interstitial pulmonary diseases: Secondary | ICD-10-CM

## 2024-05-25 DIAGNOSIS — F17210 Nicotine dependence, cigarettes, uncomplicated: Secondary | ICD-10-CM

## 2024-05-25 DIAGNOSIS — L89520 Pressure ulcer of left ankle, unstageable: Secondary | ICD-10-CM

## 2024-05-25 DIAGNOSIS — Z89422 Acquired absence of other left toe(s): Secondary | ICD-10-CM

## 2024-05-25 DIAGNOSIS — Z87891 Personal history of nicotine dependence: Secondary | ICD-10-CM

## 2024-05-25 DIAGNOSIS — F339 Major depressive disorder, recurrent, unspecified: Secondary | ICD-10-CM

## 2024-05-25 DIAGNOSIS — M869 Osteomyelitis, unspecified: Secondary | ICD-10-CM

## 2024-05-25 DIAGNOSIS — R0981 Nasal congestion: Secondary | ICD-10-CM

## 2024-05-25 DIAGNOSIS — I509 Heart failure, unspecified: Secondary | ICD-10-CM

## 2024-05-25 DIAGNOSIS — J441 Chronic obstructive pulmonary disease with (acute) exacerbation: Secondary | ICD-10-CM | POA: Diagnosis not present

## 2024-05-25 DIAGNOSIS — Z122 Encounter for screening for malignant neoplasm of respiratory organs: Secondary | ICD-10-CM

## 2024-05-25 DIAGNOSIS — Z720 Tobacco use: Secondary | ICD-10-CM

## 2024-05-25 MED ORDER — PREGABALIN 150 MG PO CAPS: 150.0000 mg | ORAL_CAPSULE | Freq: Two times a day (BID) | ORAL | 3 refills | Status: DC

## 2024-05-25 NOTE — Progress Notes (Signed)
 [name]    [id number]    July 17, 1961  Primary Care Physician:Cable, Lynwood HERO, NP  Referring Physician: Wendee Lynwood HERO, NP [address] [place],  [place] [address]  Chief complaint:   Patient with a history of obstructive sleep apnea History of obstructive lung disease  HPI:  Patient with severe obstructive sleep apnea for which he tried CPAP Intolerant of CPAP because of chronic nasal stuffiness and congestion - Has a pending appointment to see ENT  Restless legs for which he was on gabapentin , switched over to Lyrica  - Continues with Requip  0.5 mg Leg movement and discomfort still continues to contribute to disruption of his sleep  Uses Flonase for nasal stuffiness, has been using Zyrtec   An inspire device discussed as an option of treatment for his sleep disordered breathing as well  He has a history of obstructive lung disease, has not been able to quit smoking yet Smokes about a pack a day, significant nasal stuffiness and congestion pressures  He was hospitalized in October 2023 for respiratory failure, pneumonia  Has underlying history of atrial fibrillation, diabetes, coronary artery disease  He does use Breztri  twice a day  He was having significant enough difficulty with using the CPAP and felt it was actually keeping him from being able to get enough rest  Outpatient Encounter Medications as of 05/25/2024  Medication Sig   acetaminophen  (TYLENOL ) 500 MG tablet Take 1,000 mg by mouth every 6 (six) hours as needed for headache.   albuterol  (VENTOLIN  HFA) 108 (90 Base) MCG/ACT inhaler Inhale 1-2 puffs into the lungs every 6 (six) hours as needed for wheezing or shortness of breath.   aspirin  81 MG chewable tablet Chew 1 tablet (81 mg total) by mouth daily.   atorvastatin  (LIPITOR ) 80 MG tablet Take 1 tablet (80 mg total) by mouth at bedtime.   BREZTRI  AEROSPHERE 160-9-4.8 MCG/ACT AERO inhaler INHALE 2 PUFFS IN THE MORNING AND AT BEDTIME    cetirizine  (ZYRTEC ) 10 MG tablet Take 1 tablet (10 mg total) by mouth every morning.   citalopram  (CELEXA ) 10 MG tablet Take 10 mg by mouth every morning. Take with 20 to equal 30 mg daily   citalopram  (CELEXA ) 20 MG tablet Take 20 mg by mouth every morning. Take with 10 to equal 30 mg daily   ezetimibe  (ZETIA ) 10 MG tablet Take 1 tablet by mouth every morning.   furosemide  (LASIX ) 20 MG tablet Take 1 tablet (20 mg total) by mouth daily as needed.   glucose blood test strip 1 each by Other route as needed for other. For one touch ultra meter. DX: E11.9   HUMALOG KWIKPEN 100 UNIT/ML KwikPen Inject 20 Units into the skin 3 (three) times daily with meals.   Insulin  Pen Needle 32G X 4 MM MISC Use to inject Insulin  daily. Dx: E11.9   Insulin  Syringe-Needle U-100 (INSULIN  SYRINGE .3CC/31GX5/16) 31G X 5/16 0.3 ML MISC 1 Units by Does not apply route 2 (two) times daily.   Lancets (ONETOUCH ULTRASOFT) lancets Use as instructed   LANTUS  SOLOSTAR 100 UNIT/ML Solostar Pen Inject 32 Units into the skin 2 (two) times daily.   metoprolol  succinate (TOPROL -XL) 100 MG 24 hr tablet Take 100 mg by mouth every morning.   nitroGLYCERIN  (NITROSTAT ) 0.4 MG SL tablet Place 1 tablet (0.4 mg total) under the tongue every 5 (five) minutes as needed for chest pain.   oxyCODONE  (ROXICODONE ) 5 MG immediate release tablet Take 1-2  tablets (5-10 mg total) by mouth every 4 (four) hours as needed for moderate pain (pain score 4-6) or severe pain (pain score 7-10).   pregabalin  (LYRICA ) 75 MG capsule Take 1 capsule by mouth twice daily   rOPINIRole  (REQUIP ) 0.5 MG tablet TAKE 1 TABLET BY MOUTH THREE TIMES DAILY   sacubitril -valsartan  (ENTRESTO ) 24-26 MG Take 1 tablet by mouth daily. 0.5 tablet daily   sitaGLIPtin (JANUVIA) 100 MG tablet Take 1 tablet by mouth daily.   tirzepatide (MOUNJARO) 2.5 MG/0.5ML Pen Inject 2.5 mg into the skin every Wednesday.   No facility-administered encounter medications on file as of 05/25/2024.     Allergies as of 05/25/2024 - Review Complete 05/25/2024  Allergen Reaction Noted   Latex Rash 06/16/2016   Lactose intolerance (gi)  02/29/2024    Past Medical History:  Diagnosis Date   Aortic atherosclerosis (HCC)    Atrial fibrillation (HCC)    a.) initially occurring in post-op setting in 05/2016, started on Eliquis ; b.) s/p LAA closure at time of CABG 08/24/2016 --> Eliquis  switched to Coumadin ; c.) CHA2DS2VASc = 4 (HFrEF, HTN, prior MI/vascular disease, T2DM) as of 02/29/2024; d.) s/p RFCA 12/20/2020; e.) rate/rhythm maintained on oral metoprolol  succinate; no OAC at this time   Basal cell carcinoma of skin    CAD (coronary artery disease)    a. 05/2016: NSTEMI, cath showing 3v disease with CABG recommended once MRSA infection resolved. b. 08/2016: CABG with LIMA-LAD, SVG-D1, and SVG-RI.   Cardiomegaly    CKD (chronic kidney disease)    Complication of anesthesia    tachycardia, rash, delayed emergence after cagb-pt aspirated after cyst removal   COPD (chronic obstructive pulmonary disease) (HCC)    DM (diabetes mellitus), type 2 (HCC)    Essential tremor    Gastroparesis    Headache(784.0)    Hematuria, microscopic SINCE I WAS A KID   HFrEF (heart failure with reduced ejection fraction) (HCC)    Hyperlipidemia    Hypertension    Ischemic cardiomyopathy    a. 05/2016: echo w/ EF of 25-35% and akinesis of the basal-midinferolateral and inferior myocardium.   Long-term use of aspirin  therapy    NSTEMI (non-ST elevated myocardial infarction) (HCC) 06/11/2016   Osteomyelitis of left foot (HCC)    Paresthesia and pain of both upper extremities    Peripheral neuropathy    Persistent depressive disorder    Pulmonary hypertension (HCC)    Respiratory failure (HCC)    RLS (restless legs syndrome)    a.) on ropinirole    Rotator cuff tear (BILATERAL)    S/P CABG x 3 08/24/2016   a.) LIMA-LAD, SVG-D1, SVG-RI.   S/P left atrial appendage ligation 08/24/2016   Sleep apnea     a.) unable to tolerate mask required for nocturnal PAP therapy   Thrombocytopenia (HCC)    Tobacco use     Past Surgical History:  Procedure Laterality Date   AMPUTATION TOE Left 02/06/2020   5th ray amputation with graph [facility])   BRONCHIAL BRUSHINGS  08/01/2022   Procedure: BRONCHIAL BRUSHINGS;  Surgeon: Claudene Toribio BROCKS, MD;  Location: [facility] ENDOSCOPY;  Service: Pulmonary;;   BRONCHIAL WASHINGS  08/01/2022   Procedure: BRONCHIAL WASHINGS;  Surgeon: Claudene Toribio BROCKS, MD;  Location: [facility] ENDOSCOPY;  Service: Pulmonary;;   CARDIAC CATHETERIZATION N/A 06/16/2016   Procedure: Left Heart Cath and Coronary Angiography;  Surgeon: Debby DELENA Sor, MD;  Location: [facility] INVASIVE CV LAB;  Service: Cardiovascular;  Laterality: N/A;   CLIPPING OF ATRIAL APPENDAGE N/A 08/24/2016  Procedure: CLIPPING OF ATRIAL APPENDAGE;  Surgeon: Maude Fleeta Ochoa, MD;  Location: [facility] OR;  Service: Open Heart Surgery;  Laterality: N/A;   COLONOSCOPY WITH PROPOFOL  N/A 02/12/2022   Procedure: COLONOSCOPY WITH PROPOFOL ;  Surgeon: Jinny Carmine, MD;  Location: [facility] ENDOSCOPY;  Service: Endoscopy;  Laterality: N/A;   CORONARY ARTERY BYPASS GRAFT N/A 08/24/2016   Procedure: CORONARY ARTERY BYPASS GRAFTING (CABG) x three,  using left internal mammary artery and right leg greater saphenous vein harvested endoscopically;  Surgeon: Maude Fleeta Ochoa, MD;  Location: [facility] OR;  Service: Open Heart Surgery;  Laterality: N/A;   HERNIA REPAIR  X2   INCISION AND DRAINAGE ABSCESS Left 06/10/2016   Procedure: INCISION AND DRAINAGE LEFT AXILLARY ABSCESS;  Surgeon: Deward Null III, MD;  Location: [facility] ORS;  Service: General;  Laterality: Left;   REVERSE SHOULDER ARTHROPLASTY Left 03/02/2024   Procedure: ARTHROPLASTY, SHOULDER, TOTAL, REVERSE;  Surgeon: Edie Norleen PARAS, MD;  Location: [facility] ORS;  Service: Orthopedics;  Laterality: Left;   ROTATOR CUFF REPAIR Bilateral    TEE WITHOUT CARDIOVERSION N/A 08/24/2016   Procedure: TRANSESOPHAGEAL ECHOCARDIOGRAM (TEE);   Surgeon: Maude Fleeta Ochoa, MD;  Location: [facility] OR;  Service: Open Heart Surgery;  Laterality: N/A;   VIDEO BRONCHOSCOPY Right 08/01/2022   Procedure: VIDEO BRONCHOSCOPY WITH FLUORO;  Surgeon: Claudene Toribio BROCKS, MD;  Location: [facility] ENDOSCOPY;  Service: Pulmonary;  Laterality: Right;    Family History  Problem Relation Age of Onset   Hypertension Mother    Breast cancer Mother    Hypertension Father    Sudden death Father 14       at age of death   Diabetes Father    Heart attack Father    Heart attack Sister    Cancer Maternal Aunt        breast x2 aunts    Social History   Socioeconomic History   Marital status: Married    Spouse name: Not on file   Number of children: Not on file   Years of education: Not on file   Highest education level: Not on file  Occupational History   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   Overall Financial Resource Strain (CARDIA)    Difficulty of Paying Living Expenses: Not very hard  Food Insecurity: No Food Insecurity (04/14/2024)   [facility]   Hunger Vital Sign    Within the past 12 months, you worried that your food would run out before you got the money to buy more.: Never true    Within the past 12 months, the food you bought just didn't last and you didn't have money to get more.: Never true  Transportation  Needs: No Transportation Needs (04/14/2024)   Received from [facility]   PRAPARE - Transportation    In the past 12 months, has lack of transportation kept you from medical appointments or from getting medications?: No    Lack of Transportation (Non-Medical): No  Physical Activity: Inactive (03/08/2024)   Exercise Vital Sign    Days of Exercise per Week: 0 days    Minutes of Exercise per Session: 0 min  Stress: No Stress Concern Present (03/08/2024)   [employer] of Occupational Health - Occupational Stress Questionnaire    Feeling of Stress : Only a little  Social Connections: Socially Isolated (03/08/2024)   Social Connection and Isolation Panel    Frequency of Communication with Friends and Family: More than three times a week    Frequency of Social Gatherings with Friends and Family: More than three times a week    Attends Religious Services: Never    Database administrator or Organizations: No    Attends Banker Meetings: Never    Marital Status: Separated  Intimate Partner Violence: Not At Risk (03/08/2024)   Humiliation, Afraid, Rape, and Kick questionnaire    Fear of Current or Ex-Partner: No    Emotionally Abused: No    Physically Abused: No    Sexually Abused: No    Review of Systems  Respiratory:  Positive for apnea and shortness of breath.   Psychiatric/Behavioral:  Positive for sleep disturbance.     Vitals:   05/25/24 1138  BP: 106/68  Pulse: 68  SpO2: 99%      Physical Exam Constitutional:      Appearance: Normal appearance.  HENT:     Head: Normocephalic.     Mouth/Throat:     Mouth: Mucous membranes are moist.  Eyes:     General: No scleral icterus. Cardiovascular:     Rate and Rhythm: Normal rate and regular rhythm.     Heart sounds: No murmur heard.    No friction rub.  Pulmonary:     Effort: No respiratory distress.     Breath sounds: No stridor. No wheezing or rhonchi.  Musculoskeletal:     Cervical back:  No rigidity or tenderness.  Neurological:     Mental Status: He is alert.  Psychiatric:        Mood and Affect: Mood normal.    Data Reviewed: Pulmonary function test reveals moderate obstruction with significant bronchodilator response  Sleep study 01/17/2021 with AHI of 56, O2 nadir of 42%  Has not been using CPAP because of his chronic nasal stuffiness and congestion  Last echocardiogram 07/31/2022 - Mild pulmonary hypertension  Low-dose CT 04/20/2024 - No significant abnormality - Follow-up a year from now  Assessment:   Severe obstructive sleep apnea Unable to tolerate CPAP - He has pending evaluation by ENT to evaluate his nasal stuffiness and congestion to ascertain whether this is contributing significantly to his sleep disordered breathing and intervention  Neuropathy RLS - On Lyrica  75 twice a day with some improvement in symptoms but still has significant discomfort - Will increase Lyrica  to 150 twice a day - Discussed side effect profile and advised to pay attention to any new symptoms  Active smoker - Smoking cessation counseling  To continue Breztri  for obstructive lung disease  Plan/Recommendations:  Continue Breztri   Follow-up with ENT  Optimize sleep hygiene  Increase Lyrica   Albuterol  as needed  Encouraged to call with significant concerns  Follow-up in 3 months  Jennet Epley MD [facility] 05/25/2024, 11:45 AM  CC: Wendee Lynwood HERO, NP

## 2024-05-25 NOTE — Patient Instructions (Signed)
 I will see you back in about 3 months  We have increased the dose of your Lyrica  to 150 twice a day  Pay attention to if you have any side effects from it  Continue Breztri   Follow-up with ENT  Continue working on quitting smoking  Call with significant concerns

## 2024-05-30 DIAGNOSIS — Z96612 Presence of left artificial shoulder joint: Secondary | ICD-10-CM | POA: Diagnosis not present

## 2024-05-30 DIAGNOSIS — M25512 Pain in left shoulder: Secondary | ICD-10-CM | POA: Diagnosis not present

## 2024-05-31 ENCOUNTER — Encounter: Payer: Self-pay | Admitting: Nurse Practitioner

## 2024-05-31 ENCOUNTER — Ambulatory Visit (INDEPENDENT_AMBULATORY_CARE_PROVIDER_SITE_OTHER): Admitting: Nurse Practitioner

## 2024-05-31 VITALS — BP 126/78 | HR 70 | Temp 98.1°F | Ht 65.0 in | Wt 171.0 lb

## 2024-05-31 DIAGNOSIS — Z125 Encounter for screening for malignant neoplasm of prostate: Secondary | ICD-10-CM | POA: Diagnosis not present

## 2024-05-31 DIAGNOSIS — E1142 Type 2 diabetes mellitus with diabetic polyneuropathy: Secondary | ICD-10-CM

## 2024-05-31 DIAGNOSIS — Z0000 Encounter for general adult medical examination without abnormal findings: Secondary | ICD-10-CM | POA: Diagnosis not present

## 2024-05-31 DIAGNOSIS — J441 Chronic obstructive pulmonary disease with (acute) exacerbation: Secondary | ICD-10-CM | POA: Diagnosis not present

## 2024-05-31 DIAGNOSIS — Z951 Presence of aortocoronary bypass graft: Secondary | ICD-10-CM | POA: Diagnosis not present

## 2024-05-31 DIAGNOSIS — Z1211 Encounter for screening for malignant neoplasm of colon: Secondary | ICD-10-CM

## 2024-05-31 DIAGNOSIS — F339 Major depressive disorder, recurrent, unspecified: Secondary | ICD-10-CM

## 2024-05-31 DIAGNOSIS — I5020 Unspecified systolic (congestive) heart failure: Secondary | ICD-10-CM

## 2024-05-31 DIAGNOSIS — I10 Essential (primary) hypertension: Secondary | ICD-10-CM | POA: Diagnosis not present

## 2024-05-31 DIAGNOSIS — Z720 Tobacco use: Secondary | ICD-10-CM

## 2024-05-31 DIAGNOSIS — G4733 Obstructive sleep apnea (adult) (pediatric): Secondary | ICD-10-CM | POA: Diagnosis not present

## 2024-05-31 DIAGNOSIS — I25709 Atherosclerosis of coronary artery bypass graft(s), unspecified, with unspecified angina pectoris: Secondary | ICD-10-CM

## 2024-05-31 NOTE — Assessment & Plan Note (Signed)
 Discussed age-appropriate immunizations and screenings.  Did review patient's personal, surgical, social, family histories.  Patient is up-to-date on all age-appropriate vaccinations he would like.  Discussed getting shingles vaccine at local pharmacy.  Ambulatory referral for overdue CRC screening.  PSA today for prostate cancer screening.  Patient was given information at discharge about preventative healthcare maintenance with anticipatory guidance

## 2024-05-31 NOTE — Assessment & Plan Note (Signed)
 History of the same status post CABG currently maintained on atorvastatin  80 mg daily and ezetimibe  10 mg daily.  Pending lipid panel

## 2024-05-31 NOTE — Progress Notes (Signed)
 Established Patient Office Visit  Subjective   Patient ID: [name], male    DOB: 06/12/1961  Age: 63 y.o. MRN: [id number]  Chief Complaint  Patient presents with   Annual Exam    Diabetic eye exam done 3 months ago.     HPI   HTN: Patient currently maintained on Entresto , metoprolol , furosemide   HFrEF: Patient currently maintained on Entresto , metoprolol , furosemide  followed by cardiology  HLD: Patient currently maintained on ezetimibe  10 mg daily and atorvastatin  80 mg daily  Emphysema: Patient currently maintained on Breztri  twice daily and albuterol  inhaler as needed  OSA: Patient currently maintained on Lyrica  100 mg twice daily and allopurinol 1.5 mg regular pulmonology follows patient for sleep apnea. Has a CPAP but cannot tolerate it. He has an appt with ENT for further evaluation  DM2: Patient followed by endocrinology currently maintained on Januvia 100 mg daily, Mounjaro, Lantus  30 3 times daily, Humalog 20 units twice daily  Depression:  Neurology Cardiology Endocrinology Psychiatry Pulmonology  for complete physical and follow up of chronic conditions.  Immunizations: -Tetanus: Completed in 2023 -Influenza: Out of season -Shingles: Completed Zostavax live, get shingles at local pharmacy  -Pneumonia: Completed Prevnar 20  Diet: Fair diet. He is doing 2 meals a day and sometimes he will. He will drink coffee and 1-2 sodas a day  Exercise: No regular exercise. Palying with the grandkids  Eye exam: Completes every 3 months for left eye shot. Pressure and bleeding  Dental exam: Needs updating    Colonoscopy: Completed in 02/12/2022, repeat 1 year Lung Cancer Screening: Completed in 04/20/2024, repeat 1 year  PSA: Due  Sleep: going to bed around 4-5am and then will sleep to 9am. He will do that for the bigger part of the week. On the weekend he will go to sleep at 4-5 and then sleep to the next day       Review of Systems  Constitutional:   Negative for chills and fever.  Respiratory:  Negative for shortness of breath.   Cardiovascular:  Negative for chest pain and leg swelling.  Gastrointestinal:  Negative for abdominal pain, blood in stool, constipation, diarrhea, nausea and vomiting.       Bm daily   Genitourinary:  Negative for dysuria and hematuria.  Neurological:  Positive for dizziness and tingling. Negative for headaches.  Psychiatric/Behavioral:  Negative for hallucinations and suicidal ideas.       Objective:     BP 126/78   Pulse 70   Temp 98.1 F (36.7 C) (Oral)   Ht 5' 5 (1.651 m)   Wt 171 lb (77.6 kg)   SpO2 95%   BMI 28.46 kg/m    Physical Exam Vitals and nursing note reviewed.  Constitutional:      Appearance: Normal appearance.  HENT:     Right Ear: Tympanic membrane, ear canal and external ear normal.     Left Ear: Tympanic membrane, ear canal and external ear normal.     Mouth/Throat:     Mouth: Mucous membranes are moist.     Pharynx: Oropharynx is clear.  Eyes:     Extraocular Movements: Extraocular movements intact.     Pupils: Pupils are equal, round, and reactive to light.  Cardiovascular:     Rate and Rhythm: Normal rate and regular rhythm.     Pulses: Normal pulses.     Heart sounds: Normal heart sounds.  Pulmonary:     Effort: Pulmonary effort is normal.  Breath sounds: Normal breath sounds.  Abdominal:     General: Bowel sounds are normal. There is no distension.     Palpations: There is no mass.     Tenderness: There is no abdominal tenderness.     Hernia: No hernia is present.  Genitourinary:    Comments: deferred Musculoskeletal:     Right lower leg: No edema.     Left lower leg: No edema.  Lymphadenopathy:     Cervical: No cervical adenopathy.  Skin:    General: Skin is warm.  Neurological:     General: No focal deficit present.     Mental Status: He is alert.     Deep Tendon Reflexes:     Reflex Scores:      Bicep reflexes are 2+ on the right side and  2+ on the left side.      Patellar reflexes are 2+ on the right side and 2+ on the left side.    Comments: Bilateral upper and lower extremity strength 5/5  Psychiatric:        Mood and Affect: Mood normal.        Behavior: Behavior normal.        Thought Content: Thought content normal.        Judgment: Judgment normal.      No results found for any visits on 05/31/24.    The ASCVD Risk score (Arnett DK, et al., 2019) failed to calculate for the following reasons:   Risk score cannot be calculated because patient has a medical history suggesting prior/existing ASCVD    Assessment & Plan:   Problem List Items Addressed This Visit       Cardiovascular and Mediastinum   Essential hypertension   Currently on Entresto , metoprolol , furosemide .  Blood pressure controlled continue medication as prescribed      Relevant Orders   CBC with Differential/Platelet   Comprehensive metabolic panel with GFR   Lipid panel   TSH   Coronary artery disease involving native heart with angina pectoris (HCC)   History of the same status post CABG currently maintained on atorvastatin  80 mg daily and ezetimibe  10 mg daily.  Pending lipid panel      Relevant Orders   Lipid panel   HFrEF (heart failure with reduced ejection fraction) (HCC)   Patient currently maintained on Entresto , furosemide  as needed, and metoprolol .  Patient is followed by cardiology.  Continue taking medications as prescribed euvolemic in office        Respiratory   COPD exacerbation (HCC)   Patient currently maintained on Breztri  and albuterol .  Follow-up pulmonology.  Stable      OSA (obstructive sleep apnea)   History of the same follow-up with pulmonology.  Intolerant to CPAP mask.  Patient does have consult with ENT coming up continue following with pulmonology as recommended        Endocrine   Type 2 diabetes mellitus with peripheral neuropathy (HCC)   Patient is followed by endocrinology maintained on  long-acting insulin , short acting insulin , Mounjaro, Januvia.  Continue taking medication as prescribed follow-up with specialist as recommended      Relevant Medications   gabapentin  (NEURONTIN ) 300 MG capsule   Other Relevant Orders   Microalbumin / creatinine urine ratio     Other   Tobacco abuse   History of same pending urine microscopy rule out microscopic hematuria      Relevant Orders   Urine Microscopic   S/P CABG x 3  Followed by cardiology patient on high intensity statin and ezetimibe .      Relevant Orders   Lipid panel   Preventative health care - Primary   Discussed age-appropriate immunizations and screenings.  Did review patient's personal, surgical, social, family histories.  Patient is up-to-date on all age-appropriate vaccinations he would like.  Discussed getting shingles vaccine at local pharmacy.  Ambulatory referral for overdue CRC screening.  PSA today for prostate cancer screening.  Patient was given information at discharge about preventative healthcare maintenance with anticipatory guidance      Depression, recurrent [facility])   Patient currently followed by [facility] psychiatry maintained on citalopram  40 mg daily continue following with specialist continue taking medication as prescribed      Other Visit Diagnoses       Screening for prostate cancer       Relevant Orders   PSA, Medicare     Screening for colon cancer       Relevant Orders   Ambulatory referral to Gastroenterology       Return in about 6 months (around 12/01/2024) for HTN/COPD.    Adina Crandall, NP

## 2024-05-31 NOTE — Assessment & Plan Note (Signed)
 History of same pending urine microscopy rule out microscopic hematuria

## 2024-05-31 NOTE — Assessment & Plan Note (Signed)
 Patient currently followed by [facility] psychiatry maintained on citalopram  40 mg daily continue following with specialist continue taking medication as prescribed

## 2024-05-31 NOTE — Assessment & Plan Note (Signed)
 History of the same follow-up with pulmonology.  Intolerant to CPAP mask.  Patient does have consult with ENT coming up continue following with pulmonology as recommended

## 2024-05-31 NOTE — Patient Instructions (Signed)
 Nice to see you toyda I will be in touch with the labs once I have them Follow up with me in 6 months, sooner If you need me  Consider getting the shingles vaccine at the local pharmacy

## 2024-05-31 NOTE — Assessment & Plan Note (Signed)
 Patient currently maintained on Breztri  and albuterol .  Follow-up pulmonology.  Stable

## 2024-05-31 NOTE — Assessment & Plan Note (Signed)
 Patient is followed by endocrinology maintained on long-acting insulin , short acting insulin , Mounjaro, Januvia.  Continue taking medication as prescribed follow-up with specialist as recommended

## 2024-05-31 NOTE — Assessment & Plan Note (Signed)
 Patient currently maintained on Entresto , furosemide  as needed, and metoprolol .  Patient is followed by cardiology.  Continue taking medications as prescribed euvolemic in office

## 2024-05-31 NOTE — Assessment & Plan Note (Signed)
 Currently on Entresto , metoprolol , furosemide .  Blood pressure controlled continue medication as prescribed

## 2024-05-31 NOTE — Assessment & Plan Note (Signed)
 Followed by cardiology patient on high intensity statin and ezetimibe .

## 2024-06-01 LAB — CBC WITH DIFFERENTIAL/PLATELET
Basophils Relative: 1 % (ref 0.0–3.0)
Eosinophils Relative: 7 % — ABNORMAL HIGH (ref 0.0–5.0)
HCT: 46 % (ref 39.0–52.0)
Hemoglobin: 14.9 g/dL (ref 13.0–17.0)
Lymphocytes Relative: 42 % (ref 12.0–46.0)
MCHC: 32.3 g/dL (ref 30.0–36.0)
MCV: 85.5 fl (ref 78.0–100.0)
Monocytes Relative: 9 % (ref 3.0–12.0)
Neutrophils Relative %: 41 % — ABNORMAL LOW (ref 43.0–77.0)
Platelets: 98 K/uL — ABNORMAL LOW (ref 150.0–400.0)
RBC: 5.38 Mil/uL (ref 4.22–5.81)
RDW: 15.8 % — ABNORMAL HIGH (ref 11.5–15.5)
WBC: 8.5 K/uL (ref 4.0–10.5)

## 2024-06-01 LAB — URINALYSIS, MICROSCOPIC ONLY

## 2024-06-01 LAB — LIPID PANEL
Cholesterol: 108 mg/dL (ref 0–200)
HDL: 32.8 mg/dL — ABNORMAL LOW (ref >39.00)
LDL Cholesterol: 54 mg/dL (ref 0–99)
NonHDL: 75.03
Total CHOL/HDL Ratio: 3
Triglycerides: 103 mg/dL (ref 0.0–149.0)
VLDL: 20.6 mg/dL (ref 0.0–40.0)

## 2024-06-01 LAB — MICROALBUMIN / CREATININE URINE RATIO
Creatinine,U: 71.6 mg/dL
Microalb Creat Ratio: 577.3 mg/g — ABNORMAL HIGH (ref 0.0–30.0)
Microalb, Ur: 41.3 mg/dL — ABNORMAL HIGH (ref 0.0–1.9)

## 2024-06-01 LAB — COMPREHENSIVE METABOLIC PANEL WITH GFR
ALT: 12 U/L (ref 0–53)
AST: 17 U/L (ref 0–37)
Albumin: 4.3 g/dL (ref 3.5–5.2)
Alkaline Phosphatase: 118 U/L — ABNORMAL HIGH (ref 39–117)
BUN: 25 mg/dL — ABNORMAL HIGH (ref 6–23)
CO2: 30 meq/L (ref 19–32)
Calcium: 8.2 mg/dL — ABNORMAL LOW (ref 8.4–10.5)
Chloride: 102 meq/L (ref 96–112)
Creatinine, Ser: 1.49 mg/dL (ref 0.40–1.50)
GFR: 49.82 mL/min — ABNORMAL LOW (ref >60.00)
Glucose, Bld: 249 mg/dL — ABNORMAL HIGH (ref 70–99)
Potassium: 4.9 meq/L (ref 3.5–5.1)
Sodium: 141 meq/L (ref 135–145)
Total Bilirubin: 0.7 mg/dL (ref 0.2–1.2)
Total Protein: 6.8 g/dL (ref 6.0–8.3)

## 2024-06-01 LAB — TSH: TSH: 1.25 u[IU]/mL (ref 0.35–5.50)

## 2024-06-01 LAB — PSA, MEDICARE: PSA: 1.62 ng/mL (ref 0.10–4.00)

## 2024-06-02 ENCOUNTER — Ambulatory Visit: Payer: Self-pay | Admitting: Nurse Practitioner

## 2024-06-06 DIAGNOSIS — Z96612 Presence of left artificial shoulder joint: Secondary | ICD-10-CM | POA: Diagnosis not present

## 2024-06-06 DIAGNOSIS — M25512 Pain in left shoulder: Secondary | ICD-10-CM | POA: Diagnosis not present

## 2024-06-09 DIAGNOSIS — M12812 Other specific arthropathies, not elsewhere classified, left shoulder: Secondary | ICD-10-CM | POA: Diagnosis not present

## 2024-06-09 DIAGNOSIS — M7582 Other shoulder lesions, left shoulder: Secondary | ICD-10-CM | POA: Diagnosis not present

## 2024-06-09 DIAGNOSIS — M75122 Complete rotator cuff tear or rupture of left shoulder, not specified as traumatic: Secondary | ICD-10-CM | POA: Diagnosis not present

## 2024-06-09 DIAGNOSIS — Z96612 Presence of left artificial shoulder joint: Secondary | ICD-10-CM | POA: Diagnosis not present

## 2024-06-12 DIAGNOSIS — E1129 Type 2 diabetes mellitus with other diabetic kidney complication: Secondary | ICD-10-CM | POA: Diagnosis not present

## 2024-06-12 DIAGNOSIS — E1122 Type 2 diabetes mellitus with diabetic chronic kidney disease: Secondary | ICD-10-CM | POA: Diagnosis not present

## 2024-06-12 DIAGNOSIS — N1831 Chronic kidney disease, stage 3a: Secondary | ICD-10-CM | POA: Diagnosis not present

## 2024-06-12 DIAGNOSIS — Z794 Long term (current) use of insulin: Secondary | ICD-10-CM | POA: Diagnosis not present

## 2024-06-12 DIAGNOSIS — R809 Proteinuria, unspecified: Secondary | ICD-10-CM | POA: Diagnosis not present

## 2024-06-13 ENCOUNTER — Ambulatory Visit (INDEPENDENT_AMBULATORY_CARE_PROVIDER_SITE_OTHER): Admitting: Otolaryngology

## 2024-06-13 ENCOUNTER — Encounter (INDEPENDENT_AMBULATORY_CARE_PROVIDER_SITE_OTHER): Payer: Self-pay | Admitting: Otolaryngology

## 2024-06-13 VITALS — BP 99/62 | HR 78

## 2024-06-13 DIAGNOSIS — F17210 Nicotine dependence, cigarettes, uncomplicated: Secondary | ICD-10-CM

## 2024-06-13 DIAGNOSIS — J342 Deviated nasal septum: Secondary | ICD-10-CM | POA: Diagnosis not present

## 2024-06-13 DIAGNOSIS — R0981 Nasal congestion: Secondary | ICD-10-CM

## 2024-06-13 DIAGNOSIS — J343 Hypertrophy of nasal turbinates: Secondary | ICD-10-CM | POA: Diagnosis not present

## 2024-06-13 DIAGNOSIS — Z9109 Other allergy status, other than to drugs and biological substances: Secondary | ICD-10-CM

## 2024-06-13 DIAGNOSIS — Z720 Tobacco use: Secondary | ICD-10-CM

## 2024-06-13 DIAGNOSIS — G4733 Obstructive sleep apnea (adult) (pediatric): Secondary | ICD-10-CM

## 2024-06-13 DIAGNOSIS — R0982 Postnasal drip: Secondary | ICD-10-CM

## 2024-06-13 MED ORDER — LEVOCETIRIZINE DIHYDROCHLORIDE 5 MG PO TABS: 5.0000 mg | ORAL_TABLET | Freq: Every evening | ORAL | 3 refills | Status: AC

## 2024-06-13 MED ORDER — FLUTICASONE PROPIONATE 50 MCG/ACT NA SUSP: 2.0000 | Freq: Two times a day (BID) | NASAL | 6 refills | Status: AC

## 2024-06-13 NOTE — Patient Instructions (Signed)
 Aureliano Med Nasal Saline Rinse   - start nasal saline rinses with NeilMed Bottle available over the counter or online to help with nasal congestion      Dear [name],   Congratulations for your interest in quitting smoking!  Find a program that suits you best: when you want to quit, how you need support, where you live, and how you like to learn.    If you're ready to get started TODAY, consider scheduling a visit through [email or web address].  Appointments are available from 8am to 8pm, Monday to Friday.   Most health insurance plans will cover some level of tobacco cessation visits and medications.    Additional Resources: [employer] are also available to help you quit & provide the support you'll need. Many programs are available in both [place] and Spanish and have a long history of successfully helping people get off and stay off tobacco.    Quit Smoking Apps:  quitSTART at [employer]iousBroker.de QuitGuide?at ForgetParking.dk Online education and resources: Smokefree  at [employer].gov Free Telephone Coaching: QuitNow,  Call 1-800-QUIT-NOW (208-313-0729) or Text- Ready to (413)872-2938 *Quit[employer] [place] has teamed up with Medicaid to offer a free 14 week program    Vaping- Want to Quit? Free 24/7 support. Call [facility]  [employer], [place], [place], [place], [place]  [facility]

## 2024-06-13 NOTE — Progress Notes (Signed)
 ENT CONSULT:  Reason for Consult: chronic nasal congestion CPAP intolerance     HPI: Discussed the use of AI scribe software for clinical note transcription with the patient, who gave verbal consent to proceed.  History of Present Illness [name] is a 63 year old male current smoker, hx of OSA on CPAP, who presents with chronic nasal congestion and difficulty tolerating CPAP therapy.  He has experienced persistent nasal congestion for about a year, which began after acquiring a dog, raising suspicion of a possible allergy. No prior allergy testing has been conducted. He describes the sensation as feeling 'congested' all the time.  He is under the care of a pulmonary specialist for CPAP therapy but finds it difficult to use the CPAP machine due to nasal congestion, stating, 'I just can't sleep with it.'  He has a history of smoking and continues to smoke, expressing a desire to quit but finding it challenging. He has successfully quit smoking in the past.  He underwent skin cancer surgery on his nose, during which a portion of the lobe was removed, but he is unsure if this is related to his current nasal issues.  He is not currently using any nasal sprays or allergy medications.   Records Reviewed:  Dr Neda 05/25/24 Patient with severe obstructive sleep apnea for which he tried CPAP Intolerant of CPAP because of chronic nasal stuffiness and congestion - Has a pending appointment to see ENT   Restless legs for which he was on gabapentin , switched over to Lyrica  - Continues with Requip  0.5 mg Leg movement and discomfort still continues to contribute to disruption of his sleep   Uses Flonase  for nasal stuffiness, has been using Zyrtec    An inspire device discussed as an option of treatment for his sleep disordered breathing as well   He has a history of obstructive lung disease, has not been able to quit smoking yet Smokes about a pack a day, significant nasal  stuffiness and congestion pressures   He was hospitalized in October 2023 for respiratory failure, pneumonia   Has underlying history of atrial fibrillation, diabetes, coronary artery disease   He does use Breztri  twice a day   He was having significant enough difficulty with using the CPAP and felt it was actually keeping him from being able to get enough rest      Past Medical History:  Diagnosis Date   Aortic atherosclerosis (HCC)    Atrial fibrillation (HCC)    a.) initially occurring in post-op setting in 05/2016, started on Eliquis ; b.) s/p LAA closure at time of CABG 08/24/2016 --> Eliquis  switched to Coumadin ; c.) CHA2DS2VASc = 4 (HFrEF, HTN, prior MI/vascular disease, T2DM) as of 02/29/2024; d.) s/p RFCA 12/20/2020; e.) rate/rhythm maintained on oral metoprolol  succinate; no OAC at this time   Basal cell carcinoma of skin    CAD (coronary artery disease)    a. 05/2016: NSTEMI, cath showing 3v disease with CABG recommended once MRSA infection resolved. b. 08/2016: CABG with LIMA-LAD, SVG-D1, and SVG-RI.   Cardiomegaly    CKD (chronic kidney disease)    Complication of anesthesia    tachycardia, rash, delayed emergence after cagb-pt aspirated after cyst removal   COPD (chronic obstructive pulmonary disease) (HCC)    DM (diabetes mellitus), type 2 (HCC)    Essential tremor    Gastroparesis    Headache(784.0)    Hematuria, microscopic SINCE I WAS A KID   HFrEF (heart failure with reduced ejection fraction) (HCC)  Hyperlipidemia    Hypertension    Ischemic cardiomyopathy    a. 05/2016: echo w/ EF of 25-35% and akinesis of the basal-midinferolateral and inferior myocardium.   Long-term use of aspirin  therapy    NSTEMI (non-ST elevated myocardial infarction) (HCC) 06/11/2016   Osteomyelitis of left foot (HCC)    Paresthesia and pain of both upper extremities    Peripheral neuropathy    Persistent depressive disorder    Pulmonary hypertension (HCC)    Respiratory  failure (HCC)    RLS (restless legs syndrome)    a.) on ropinirole    Rotator cuff tear (BILATERAL)    S/P CABG x 3 08/24/2016   a.) LIMA-LAD, SVG-D1, SVG-RI.   S/P left atrial appendage ligation 08/24/2016   Sleep apnea    a.) unable to tolerate mask required for nocturnal PAP therapy   Thrombocytopenia (HCC)    Tobacco use     Past Surgical History:  Procedure Laterality Date   AMPUTATION TOE Left 02/06/2020   5th ray amputation with graph [facility])   BRONCHIAL BRUSHINGS  08/01/2022   Procedure: BRONCHIAL BRUSHINGS;  Surgeon: Claudene Toribio BROCKS, MD;  Location: [facility] ENDOSCOPY;  Service: Pulmonary;;   BRONCHIAL WASHINGS  08/01/2022   Procedure: BRONCHIAL WASHINGS;  Surgeon: Claudene Toribio BROCKS, MD;  Location: [facility] ENDOSCOPY;  Service: Pulmonary;;   CARDIAC CATHETERIZATION N/A 06/16/2016   Procedure: Left Heart Cath and Coronary Angiography;  Surgeon: Debby DELENA Sor, MD;  Location: [facility] INVASIVE CV LAB;  Service: Cardiovascular;  Laterality: N/A;   CLIPPING OF ATRIAL APPENDAGE N/A 08/24/2016   Procedure: CLIPPING OF ATRIAL APPENDAGE;  Surgeon: Maude Fleeta Ochoa, MD;  Location: [facility] OR;  Service: Open Heart Surgery;  Laterality: N/A;   COLONOSCOPY WITH PROPOFOL  N/A 02/12/2022   Procedure: COLONOSCOPY WITH PROPOFOL ;  Surgeon: Jinny Carmine, MD;  Location: [facility] ENDOSCOPY;  Service: Endoscopy;  Laterality: N/A;   CORONARY ARTERY BYPASS GRAFT N/A 08/24/2016   Procedure: CORONARY ARTERY BYPASS GRAFTING (CABG) x three,  using left internal mammary artery and right leg greater saphenous vein harvested endoscopically;  Surgeon: Maude Fleeta Ochoa, MD;  Location: [facility] OR;  Service: Open Heart Surgery;  Laterality: N/A;   HERNIA REPAIR  X2   INCISION AND DRAINAGE ABSCESS Left 06/10/2016   Procedure: INCISION AND DRAINAGE LEFT AXILLARY ABSCESS;  Surgeon: Deward Null III, MD;  Location: [facility] ORS;  Service: General;  Laterality: Left;   REVERSE SHOULDER ARTHROPLASTY Left 03/02/2024   Procedure: ARTHROPLASTY, SHOULDER, TOTAL,  REVERSE;  Surgeon: Edie Norleen PARAS, MD;  Location: [facility] ORS;  Service: Orthopedics;  Laterality: Left;   ROTATOR CUFF REPAIR Bilateral    TEE WITHOUT CARDIOVERSION N/A 08/24/2016   Procedure: TRANSESOPHAGEAL ECHOCARDIOGRAM (TEE);  Surgeon: Maude Fleeta Ochoa, MD;  Location: [facility] OR;  Service: Open Heart Surgery;  Laterality: N/A;   VIDEO BRONCHOSCOPY Right 08/01/2022   Procedure: VIDEO BRONCHOSCOPY WITH FLUORO;  Surgeon: Claudene Toribio BROCKS, MD;  Location: [facility] ENDOSCOPY;  Service: Pulmonary;  Laterality: Right;    Family History  Problem Relation Age of Onset   Hypertension Mother    Breast cancer Mother    Hypertension Father    Sudden death Father 38       at age of death   Diabetes Father    Heart attack Father    Heart attack Sister    Cancer Maternal Aunt        breast x2 aunts    Social History:  reports that he has been smoking cigarettes. He has a 63 pack-year  smoking history. He has never used smokeless tobacco. He reports that he does not drink alcohol and does not use drugs.  Allergies:  Allergies  Allergen Reactions   Latex Rash   Lactose Intolerance (Gi)     Medications: I have reviewed the patient's current medications.  The PMH, PSH, Medications, Allergies, and SH were reviewed and updated.  ROS: Constitutional: Negative for fever, weight loss and weight gain. Cardiovascular: Negative for chest pain and dyspnea on exertion. Respiratory: Is not experiencing shortness of breath at rest. Gastrointestinal: Negative for nausea and vomiting. Neurological: Negative for headaches. Psychiatric: The patient is not nervous/anxious  Blood pressure 99/62, pulse 78, SpO2 97%. There is no height or weight on file to calculate BMI.  PHYSICAL EXAM:  Exam: General: Well-developed, well-nourished Respiratory Respiratory effort: Equal inspiration and expiration without stridor Cardiovascular Peripheral Vascular: Warm extremities with equal color/perfusion Eyes: No nystagmus with  equal extraocular motion bilaterally Neuro/Psych/Balance: Patient oriented to person, place, and time; Appropriate mood and affect; Gait is intact with no imbalance; Cranial nerves I-XII are intact Head and Face Inspection: Normocephalic and atraumatic without mass or lesion Palpation: Facial skeleton intact without bony stepoffs Salivary Glands: No mass or tenderness Facial Strength: Facial motility symmetric and full bilaterally ENT Pinna: External ear intact and fully developed External canal: Canal is patent with intact skin Tympanic Membrane: Clear and mobile External Nose: No scar or anatomic deformity Internal Nose: Septum is deviated to the left. No polyp, or purulence. Mucosal edema and erythema present.  Bilateral inferior turbinate hypertrophy.  Lips, Teeth, and gums: Mucosa and teeth intact and viable TMJ: No pain to palpation with full mobility Oral cavity/oropharynx: No erythema or exudate, no lesions present Nasopharynx: No mass or lesion with intact mucosa Neck Neck and Trachea: Midline trachea without mass or lesion Thyroid : No mass or nodularity Lymphatics: No lymphadenopathy  Procedure:   PROCEDURE NOTE: nasal endoscopy  Preoperative diagnosis: chronic sinusitis symptoms  Postoperative diagnosis: same  Procedure: Diagnostic nasal endoscopy (68768)  Surgeon: Elena Larry, M.D.  Anesthesia: Topical lidocaine  and Afrin  H&P REVIEW: The patient's history and physical were reviewed today prior to procedure. All medications were reviewed and updated as well. Complications: None Condition is stable throughout exam Indications and consent: The patient presents with symptoms of chronic sinusitis not responding to previous therapies. All the risks, benefits, and potential complications were reviewed with the patient preoperatively and informed consent was obtained. The time out was completed with confirmation of the correct procedure.   Procedure: The patient  was seated upright in the clinic. Topical lidocaine  and Afrin were applied to the nasal cavity. After adequate anesthesia had occurred, the rigid nasal endoscope was passed into the nasal cavity. The nasal mucosa, turbinates, septum, and sinus drainage pathways were visualized bilaterally. This revealed no purulence or significant secretions that might be cultured. There were no polyps or sites of significant inflammation. The mucosa was intact and there was no crusting present. The scope was then slowly withdrawn and the patient tolerated the procedure well. There were no complications or blood loss.   Studies Reviewed: CT chest 04/20/24 IMPRESSION: Lung-RADS 2, benign appearance or behavior. Continue annual screening with low-dose chest CT without contrast in 12 months.  Assessment/Plan: Encounter Diagnoses  Name Primary?   Environmental allergies    Chronic nasal congestion Yes   Hypertrophy of both inferior nasal turbinates    Nasal septal deviation    Post-nasal drip    Obstructive sleep apnea     Assessment  and Plan Assessment & Plan Chronic nasal congestion Suspected Environmental Allergies Chronic nasal congestion likely exacerbated by smoking and possible allergies, including possible allergy to dogs No prior allergy testing or treatment. We discussed that treating congestion may improve CPAP tolerance. - Prescribed Xyzal  5 mg at night. - Prescribed Flonase  nasal spray twice daily. - Recommended daily nasal rinses. - Provided smoking cessation resources, including a 1-800 number and nicotine  patch information. - Refer for allergy testing  Deviated nasal septum, left side and ITH Left-sided septal deviation noted on nasal endoscopy. Likely contributes to his sx - Consider septoplasty if symptoms persist after maximum medical management - Re-evaluate in three months.  Tobacco use disorder.  We had an extensive discussion about detrimental effects of smoking on overall health.  I provided resources available at [facility] to assist with smoking cessation. I spent 4 min on counseling  - Advised smoking cessation      Thank you for allowing me to participate in the care of this patient. Please do not hesitate to contact me with any questions or concerns.   Elena Larry, MD Otolaryngology [facility] Phone: [phone] Fax: [phone]    06/13/2024, 11:29 AM

## 2024-06-14 DIAGNOSIS — Z96612 Presence of left artificial shoulder joint: Secondary | ICD-10-CM | POA: Diagnosis not present

## 2024-06-14 DIAGNOSIS — M25512 Pain in left shoulder: Secondary | ICD-10-CM | POA: Diagnosis not present

## 2024-06-27 ENCOUNTER — Encounter: Payer: Self-pay | Admitting: *Deleted

## 2024-06-28 DIAGNOSIS — M75101 Unspecified rotator cuff tear or rupture of right shoulder, not specified as traumatic: Secondary | ICD-10-CM | POA: Diagnosis not present

## 2024-06-28 DIAGNOSIS — G8929 Other chronic pain: Secondary | ICD-10-CM | POA: Diagnosis not present

## 2024-06-28 DIAGNOSIS — M12811 Other specific arthropathies, not elsewhere classified, right shoulder: Secondary | ICD-10-CM | POA: Diagnosis not present

## 2024-06-28 DIAGNOSIS — M25511 Pain in right shoulder: Secondary | ICD-10-CM | POA: Diagnosis not present

## 2024-06-28 DIAGNOSIS — Z96612 Presence of left artificial shoulder joint: Secondary | ICD-10-CM | POA: Diagnosis not present

## 2024-07-03 DIAGNOSIS — E119 Type 2 diabetes mellitus without complications: Secondary | ICD-10-CM | POA: Diagnosis not present

## 2024-07-04 ENCOUNTER — Other Ambulatory Visit: Payer: Self-pay | Admitting: Student

## 2024-07-04 DIAGNOSIS — M12811 Other specific arthropathies, not elsewhere classified, right shoulder: Secondary | ICD-10-CM

## 2024-07-04 DIAGNOSIS — G8929 Other chronic pain: Secondary | ICD-10-CM

## 2024-07-05 ENCOUNTER — Encounter: Payer: Self-pay | Admitting: Pulmonary Disease

## 2024-07-05 NOTE — Telephone Encounter (Signed)
 FYI

## 2024-07-10 ENCOUNTER — Other Ambulatory Visit: Payer: Self-pay | Admitting: Pulmonary Disease

## 2024-07-11 ENCOUNTER — Other Ambulatory Visit: Payer: Self-pay | Admitting: Pulmonary Disease

## 2024-07-13 ENCOUNTER — Ambulatory Visit: Payer: Self-pay | Admitting: Pulmonary Disease

## 2024-07-13 ENCOUNTER — Other Ambulatory Visit: Payer: Self-pay | Admitting: Pulmonary Disease

## 2024-07-13 ENCOUNTER — Telehealth: Payer: Self-pay | Admitting: Pulmonary Disease

## 2024-07-13 NOTE — Telephone Encounter (Unsigned)
 Copied from CRM [id number]. Topic: Clinical - Medication Refill >> Jul 13, 2024  3:59 PM [name] wrote: Medication: pregabalin  (LYRICA ) 75 MG capsule   150mg  was making patient dizzy.    Has the patient contacted their pharmacy? Yes (Agent: If no, request that the patient contact the pharmacy for the refill. If patient does not wish to contact the pharmacy document the reason why and proceed with request.) (Agent: If yes, when and what did the pharmacy advise?)  This is the patient's preferred pharmacy:  [facility] [address], [place] - [address] [place] [address] Phone: [phone] Fax: [phone]  Is this the correct pharmacy for this prescription? Yes If no, delete pharmacy and type the correct one.   Has the prescription been filled recently? Yes  Is the patient out of the medication? Yes  Has the patient been seen for an appointment in the last year OR does the patient have an upcoming appointment? Yes  Can we respond through MyChart? No  Agent: Please be advised that Rx refills may take up to 3 business days. We ask that you follow-up with your pharmacy.

## 2024-07-13 NOTE — Telephone Encounter (Signed)
 This RN made 3rd attempt, unable to leave VM

## 2024-07-13 NOTE — Telephone Encounter (Signed)
 Copied from CRM [id number]. Topic: Clinical - Medication Refill >> Jul 13, 2024  3:59 PM [name] wrote: Medication: pregabalin  (LYRICA ) 75 MG capsule   150mg  was making patient dizzy.    Has the patient contacted their pharmacy? Yes (Agent: If no, request that the patient contact the pharmacy for the refill. If patient does not wish to contact the pharmacy document the reason why and proceed with request.) (Agent: If yes, when and what did the pharmacy advise?)  This is the patient's preferred pharmacy:  [facility] [address], [place] - [address] [place] [address] Phone: [phone] Fax: [phone]  Is this the correct pharmacy for this prescription? Yes If no, delete pharmacy and type the correct one.   Has the prescription been filled recently? Yes  Is the patient out of the medication? Yes  Has the patient been seen for an appointment in the last year OR does the patient have an upcoming appointment? Yes  Can we respond through MyChart? No  Agent: Please be advised that Rx refills may take up to 3 business days. We ask that you follow-up with your pharmacy.

## 2024-07-17 ENCOUNTER — Ambulatory Visit: Payer: Self-pay

## 2024-07-17 ENCOUNTER — Ambulatory Visit
Admission: RE | Admit: 2024-07-17 | Discharge: 2024-07-17 | Disposition: A | Discharge status: Home / Self Care | Source: Ambulatory Visit | Attending: Student | Admitting: Student

## 2024-07-17 DIAGNOSIS — G8929 Other chronic pain: Secondary | ICD-10-CM | POA: Diagnosis not present

## 2024-07-17 DIAGNOSIS — M75101 Unspecified rotator cuff tear or rupture of right shoulder, not specified as traumatic: Secondary | ICD-10-CM | POA: Insufficient documentation

## 2024-07-17 DIAGNOSIS — M25511 Pain in right shoulder: Secondary | ICD-10-CM | POA: Diagnosis not present

## 2024-07-17 DIAGNOSIS — M12811 Other specific arthropathies, not elsewhere classified, right shoulder: Secondary | ICD-10-CM | POA: Insufficient documentation

## 2024-07-17 DIAGNOSIS — M19011 Primary osteoarthritis, right shoulder: Secondary | ICD-10-CM | POA: Diagnosis not present

## 2024-07-17 NOTE — Telephone Encounter (Signed)
 FYI Only or Action Required?: Action required by provider: clinical question for provider and update on patient condition.  Patient is followed in Pulmonology for OSA, COPD, last seen on 05/25/2024 by Neda Jennet LABOR, MD.  Called Nurse Triage reporting Medication Refill and Medication Problem.  Symptoms began today.  Interventions attempted: Nothing.  Symptoms are: unchanged.  Triage Disposition: Call PCP Now  Patient/caregiver understands and will follow disposition?: Yes  Copied from CRM #[id number]. Topic: Clinical - Medication Question >> Jul 17, 2024 11:17 AM [name] wrote: Reason for CRM: [name] - daughter calling in regard to refills for pregabalin  (LYRICA ) 75 MG capsule, 150mg  was making patient dizzy. Reason for Disposition  [1] Pharmacy calling with prescription questions AND [2] triager unable to answer question  Answer Assessment - Initial Assessment Questions Please contact [name] at [phone]  1. DRUG NAME: What medicine do you need to have refilled?     Lyrica  150 mg, going Lyrica  75 mg BID   The 150 mg dose was too much and causing the patient to have side effects.  4. PRESCRIBER: Who prescribed it? Note: The prescribing doctor or group is responsible for refill approvals..     Dr. Neda  5. PHARMACY: Have you contacted your pharmacy (drugstore)? Note: Some pharmacies will contact the doctor (or NP/PA).      A new prescription is needed for the Lyrica  75 mg BID   6. SYMPTOMS: Do you have any symptoms?     Lightheadedness, Dizziness  Protocols used: Medication Refill and Renewal Call-A-AH

## 2024-07-17 NOTE — Telephone Encounter (Signed)
 I called and spoke with [facility] in [place] on [address], they verified that the prescription was sent in on 05/25/24, 60 tablets, 1 tablet twice daily with 3 refills.  They verified that he did not get it filled for September, she stated they can get it ready if he would like.  I asked if this is a prescription that he can have transferred to another pharmacy and she said yes.  I told her I would call and speak with the patient and see what is going on with the prescription.    ATC patient x1.  LVM to return call.  Has refills at [facility] pharmacy.  Did not fill in September.

## 2024-07-19 DIAGNOSIS — E113412 Type 2 diabetes mellitus with severe nonproliferative diabetic retinopathy with macular edema, left eye: Secondary | ICD-10-CM | POA: Diagnosis not present

## 2024-07-19 DIAGNOSIS — E113511 Type 2 diabetes mellitus with proliferative diabetic retinopathy with macular edema, right eye: Secondary | ICD-10-CM | POA: Diagnosis not present

## 2024-07-21 ENCOUNTER — Other Ambulatory Visit: Payer: Self-pay | Admitting: Pulmonary Disease

## 2024-07-21 MED ORDER — PREGABALIN 75 MG PO CAPS: 75.0000 mg | ORAL_CAPSULE | Freq: Two times a day (BID) | ORAL | 3 refills | Status: DC

## 2024-07-21 NOTE — Telephone Encounter (Signed)
**Note De-identified  [place] Obfuscation** Please advise 

## 2024-07-21 NOTE — Telephone Encounter (Signed)
 Dr. Neda, please advise if this rx for Lyrica  can be refilled.  Patient's last OV was 05/25/2024.  Thank you.

## 2024-07-24 NOTE — Telephone Encounter (Signed)
ATC x2.  LVM to return call.  Mychart message sent.

## 2024-07-25 DIAGNOSIS — Z08 Encounter for follow-up examination after completed treatment for malignant neoplasm: Secondary | ICD-10-CM | POA: Diagnosis not present

## 2024-07-25 DIAGNOSIS — D485 Neoplasm of uncertain behavior of skin: Secondary | ICD-10-CM | POA: Diagnosis not present

## 2024-07-25 DIAGNOSIS — L570 Actinic keratosis: Secondary | ICD-10-CM | POA: Diagnosis not present

## 2024-07-25 DIAGNOSIS — D1801 Hemangioma of skin and subcutaneous tissue: Secondary | ICD-10-CM | POA: Diagnosis not present

## 2024-07-25 DIAGNOSIS — L821 Other seborrheic keratosis: Secondary | ICD-10-CM | POA: Diagnosis not present

## 2024-07-25 DIAGNOSIS — Z85828 Personal history of other malignant neoplasm of skin: Secondary | ICD-10-CM | POA: Diagnosis not present

## 2024-07-25 DIAGNOSIS — C44329 Squamous cell carcinoma of skin of other parts of face: Secondary | ICD-10-CM | POA: Diagnosis not present

## 2024-07-25 DIAGNOSIS — L814 Other melanin hyperpigmentation: Secondary | ICD-10-CM | POA: Diagnosis not present

## 2024-07-25 DIAGNOSIS — C44229 Squamous cell carcinoma of skin of left ear and external auricular canal: Secondary | ICD-10-CM | POA: Diagnosis not present

## 2024-07-26 ENCOUNTER — Ambulatory Visit (INDEPENDENT_AMBULATORY_CARE_PROVIDER_SITE_OTHER): Admitting: Internal Medicine

## 2024-07-26 ENCOUNTER — Encounter: Payer: Self-pay | Admitting: Internal Medicine

## 2024-07-26 ENCOUNTER — Other Ambulatory Visit: Payer: Self-pay

## 2024-07-26 VITALS — BP 126/70 | HR 70 | Temp 97.9°F | Ht 64.57 in | Wt 167.4 lb

## 2024-07-26 DIAGNOSIS — J438 Other emphysema: Secondary | ICD-10-CM | POA: Diagnosis not present

## 2024-07-26 DIAGNOSIS — J3089 Other allergic rhinitis: Secondary | ICD-10-CM | POA: Diagnosis not present

## 2024-07-26 DIAGNOSIS — J343 Hypertrophy of nasal turbinates: Secondary | ICD-10-CM

## 2024-07-26 NOTE — Patient Instructions (Addendum)
 Other Allergic Rhinitis: - Use nasal saline rinses before nose sprays such as with Neilmed Sinus Rinse.  Use distilled water .   - Use Flonase  2 sprays each nostril daily. Aim upward and outward. - Use Xyzal  5mg  daily.  -  Not a shot candidate- has Afib, COPD, CAD s/p CABG, on beta blocker.   COPD Current Tobacco Use - Work on smoking cessation - Continue follow up with Pulm: on Breztri  160-9-4.28mcg 2 puffs twice daily and as needed Albuterol .   Hold all anti-histamines (Xyzal , Allegra, Zyrtec , Claritin, Benadryl , Pepcid ) 3 days prior to next visit.  Follow up: 10 AM 10/8 for skin testing 1-55

## 2024-07-26 NOTE — Progress Notes (Addendum)
 NEW PATIENT  Date of Service/Encounter:  07/26/24  Consult requested by: Wendee Lynwood HERO, NP   Subjective:   [name] (DOB: 1960/12/01) is a 63 y.o. male who presents to the clinic on 07/26/2024 with a chief complaint of COPD, Nasal Congestion, and Establish Care .    History obtained from: chart review and patient.   COPD: Followed by Pulm Using Breztri  PRN and Albuterol  PRN.   Some trouble with breathing and coughing.   Still currently smoking   Rhinitis:  Started about a year ago.  Symptoms include: nasal congestion, rhinorrhea, and post nasal drainage; can't breathe because of how congested he is  Occurs year-round Potential triggers: not sure   Treatments tried:  Flonase  Xyzal   Previous allergy testing: no History of sinus surgery: no Nonallergic triggers: none      Reviewed:  06/13/2024: seen by Dr Soldatova for nasal congestion.  Also with OSA on CPAP, tobacco use, COPD.  Use Flonase /Xyzal .  Discussed smoking cessation.  Refer for allergy testing.  Does have deviated septum.  05/25/2024: followed by Linzie Dr Neda for COPD, OSA, current smoker.  Discussed smoking cessation, use Breztri  and Albuterol  PRN.  On CPAP.    03/06/2024: followed by Cardiology for HFrEF, CAD s/p CABG, afib s/p ablation, CKD and DM II.  On beta blocker.   Past Medical History: Past Medical History:  Diagnosis Date   Aortic atherosclerosis    Atrial fibrillation (HCC)    a.) initially occurring in post-op setting in 05/2016, started on Eliquis ; b.) s/p LAA closure at time of CABG 08/24/2016 --> Eliquis  switched to Coumadin ; c.) CHA2DS2VASc = 4 (HFrEF, HTN, prior MI/vascular disease, T2DM) as of 02/29/2024; d.) s/p RFCA 12/20/2020; e.) rate/rhythm maintained on oral metoprolol  succinate; no OAC at this time   Basal cell carcinoma of skin    CAD (coronary artery disease)    a. 05/2016: NSTEMI, cath showing 3v disease with CABG recommended once MRSA infection resolved. b. 08/2016: CABG  with LIMA-LAD, SVG-D1, and SVG-RI.   Cardiomegaly    CKD (chronic kidney disease)    Complication of anesthesia    tachycardia, rash, delayed emergence after cagb-pt aspirated after cyst removal   COPD (chronic obstructive pulmonary disease) (HCC)    DM (diabetes mellitus), type 2 (HCC)    Essential tremor    Gastroparesis    Headache(784.0)    Hematuria, microscopic SINCE I WAS A KID   HFrEF (heart failure with reduced ejection fraction) (HCC)    Hyperlipidemia    Hypertension    Ischemic cardiomyopathy    a. 05/2016: echo w/ EF of 25-35% and akinesis of the basal-midinferolateral and inferior myocardium.   Long-term use of aspirin  therapy    NSTEMI (non-ST elevated myocardial infarction) (HCC) 06/11/2016   Osteomyelitis of left foot (HCC)    Paresthesia and pain of both upper extremities    Peripheral neuropathy    Persistent depressive disorder    Pulmonary hypertension (HCC)    Respiratory failure (HCC)    RLS (restless legs syndrome)    a.) on ropinirole    Rotator cuff tear (BILATERAL)    S/P CABG x 3 08/24/2016   a.) LIMA-LAD, SVG-D1, SVG-RI.   S/P left atrial appendage ligation 08/24/2016   Sleep apnea    a.) unable to tolerate mask required for nocturnal PAP therapy   Thrombocytopenia    Tobacco use     Past Surgical History: Past Surgical History:  Procedure Laterality Date   AMPUTATION TOE Left 02/06/2020  5th ray amputation with graph [facility])   BRONCHIAL BRUSHINGS  08/01/2022   Procedure: BRONCHIAL BRUSHINGS;  Surgeon: Claudene Toribio BROCKS, MD;  Location: [facility] ENDOSCOPY;  Service: Pulmonary;;   BRONCHIAL WASHINGS  08/01/2022   Procedure: BRONCHIAL WASHINGS;  Surgeon: Claudene Toribio BROCKS, MD;  Location: [facility] ENDOSCOPY;  Service: Pulmonary;;   CARDIAC CATHETERIZATION N/A 06/16/2016   Procedure: Left Heart Cath and Coronary Angiography;  Surgeon: Debby DELENA Sor, MD;  Location: [facility] INVASIVE CV LAB;  Service: Cardiovascular;  Laterality: N/A;   CLIPPING OF ATRIAL APPENDAGE  N/A 08/24/2016   Procedure: CLIPPING OF ATRIAL APPENDAGE;  Surgeon: Maude Fleeta Ochoa, MD;  Location: [facility] OR;  Service: Open Heart Surgery;  Laterality: N/A;   COLONOSCOPY WITH PROPOFOL  N/A 02/12/2022   Procedure: COLONOSCOPY WITH PROPOFOL ;  Surgeon: Jinny Carmine, MD;  Location: [facility] ENDOSCOPY;  Service: Endoscopy;  Laterality: N/A;   CORONARY ARTERY BYPASS GRAFT N/A 08/24/2016   Procedure: CORONARY ARTERY BYPASS GRAFTING (CABG) x three,  using left internal mammary artery and right leg greater saphenous vein harvested endoscopically;  Surgeon: Maude Fleeta Ochoa, MD;  Location: [facility] OR;  Service: Open Heart Surgery;  Laterality: N/A;   HERNIA REPAIR  X2   INCISION AND DRAINAGE ABSCESS Left 06/10/2016   Procedure: INCISION AND DRAINAGE LEFT AXILLARY ABSCESS;  Surgeon: Deward Null III, MD;  Location: [facility] ORS;  Service: General;  Laterality: Left;   REVERSE SHOULDER ARTHROPLASTY Left 03/02/2024   Procedure: ARTHROPLASTY, SHOULDER, TOTAL, REVERSE;  Surgeon: Edie Norleen PARAS, MD;  Location: [facility] ORS;  Service: Orthopedics;  Laterality: Left;   ROTATOR CUFF REPAIR Bilateral    TEE WITHOUT CARDIOVERSION N/A 08/24/2016   Procedure: TRANSESOPHAGEAL ECHOCARDIOGRAM (TEE);  Surgeon: Maude Fleeta Ochoa, MD;  Location: [facility] OR;  Service: Open Heart Surgery;  Laterality: N/A;   VIDEO BRONCHOSCOPY Right 08/01/2022   Procedure: VIDEO BRONCHOSCOPY WITH FLUORO;  Surgeon: Claudene Toribio BROCKS, MD;  Location: [facility] ENDOSCOPY;  Service: Pulmonary;  Laterality: Right;    Family History: Family History  Problem Relation Age of Onset   Hypertension Mother    Breast cancer Mother    Hypertension Father    Sudden death Father 42       at age of death   Diabetes Father    Heart attack Father    Heart attack Sister    Cancer Maternal Aunt        breast x2 aunts    Social History:  Pets: rabbit, cat, and dog Tobacco use/exposure: 1980, 2ppd  Job: retired   Medication List:  Allergies as of 07/26/2024       Reactions   Latex Rash    Lactose Intolerance (gi)         Medication List        Accurate as of July 26, 2024  1:26 PM. If you have any questions, ask your nurse or doctor.          acetaminophen  500 MG tablet Commonly known as: TYLENOL  Take 1,000 mg by mouth every 6 (six) hours as needed for headache.   albuterol  108 (90 Base) MCG/ACT inhaler Commonly known as: VENTOLIN  HFA Inhale 1-2 puffs into the lungs every 6 (six) hours as needed for wheezing or shortness of breath.   aspirin  81 MG chewable tablet Chew 1 tablet (81 mg total) by mouth daily.   atorvastatin  80 MG tablet Commonly known as: LIPITOR  Take 1 tablet (80 mg total) by mouth at bedtime.   Breztri  Aerosphere 160-9-4.8 MCG/ACT Aero inhaler Generic drug: budesonide-glycopyrrolate-formoterol  INHALE 2 PUFFS IN THE MORNING AND AT BEDTIME   citalopram  10 MG tablet Commonly known as: CELEXA  Take 10 mg by mouth every morning. Take with 20 to equal 30 mg daily   citalopram  20 MG tablet Commonly known as: CELEXA  Take 20 mg by mouth every morning. Take with 10 to equal 30 mg daily   Entresto  24-26 MG Generic drug: sacubitril -valsartan  Take 1 tablet by mouth daily. 0.5 tablet daily   ezetimibe  10 MG tablet Commonly known as: ZETIA  Take 1 tablet by mouth every morning.   fluticasone  50 MCG/ACT nasal spray Commonly known as: FLONASE  Place 2 sprays into both nostrils 2 (two) times daily.   furosemide  20 MG tablet Commonly known as: LASIX  Take 1 tablet (20 mg total) by mouth daily as needed.   glucose blood test strip 1 each by Other route as needed for other. For one touch ultra meter. DX: E11.9   HumaLOG KwikPen 100 UNIT/ML KwikPen Generic drug: insulin  lispro Inject 20 Units into the skin 3 (three) times daily with meals.   Insulin  Pen Needle 32G X 4 MM Misc Use to inject Insulin  daily. Dx: E11.9   INSULIN  SYRINGE .3CC/31GX5/16 31G X 5/16 0.3 ML Misc 1 Units by Does not apply route 2 (two) times daily.   Lantus   SoloStar 100 UNIT/ML Solostar Pen Generic drug: insulin  glargine Inject 32 Units into the skin 2 (two) times daily.   levocetirizine 5 MG tablet Commonly known as: Xyzal  Allergy 24HR Take 1 tablet (5 mg total) by mouth every evening.   metoprolol  succinate 100 MG 24 hr tablet Commonly known as: TOPROL -XL Take 100 mg by mouth every morning.   Mounjaro 2.5 MG/0.5ML Pen Generic drug: tirzepatide Inject 2.5 mg into the skin every Wednesday.   mupirocin  ointment 2 % Commonly known as: BACTROBAN  Apply topically 3 (three) times daily.   nitroGLYCERIN  0.4 MG SL tablet Commonly known as: NITROSTAT  Place 1 tablet (0.4 mg total) under the tongue every 5 (five) minutes as needed for chest pain.   onetouch ultrasoft lancets Use as instructed What changed:  how much to take when to take this reasons to take this   oxyCODONE  5 MG immediate release tablet Commonly known as: Roxicodone  Take 1-2 tablets (5-10 mg total) by mouth every 4 (four) hours as needed for moderate pain (pain score 4-6) or severe pain (pain score 7-10).   pregabalin  75 MG capsule Commonly known as: LYRICA  TAKE 1 CAPSULE BY MOUTH TWICE DAILY   rOPINIRole  0.5 MG tablet Commonly known as: REQUIP  TAKE 1 TABLET BY MOUTH THREE TIMES DAILY   sitaGLIPtin 100 MG tablet Commonly known as: JANUVIA Take 1 tablet by mouth daily.         REVIEW OF SYSTEMS: Pertinent positives and negatives discussed in HPI.   Objective:   Physical Exam: BP 126/70 (BP Location: Left Arm, Patient Position: Sitting, Cuff Size: Normal)   Pulse 70   Temp 97.9 F (36.6 C) (Temporal)   Ht 5' 4.57 (1.64 m)   Wt 167 lb 6.4 oz (75.9 kg)   SpO2 98%   BMI 28.23 kg/m  Body mass index is 28.23 kg/m. GEN: alert, well developed HEENT: clear conjunctiva, nose with + mild inferior turbinate hypertrophy, pink nasal mucosa, slight clear rhinorrhea, + cobblestoning HEART: regular rate and rhythm, no murmur LUNGS: clear to auscultation  bilaterally, no coughing, unlabored respiration ABDOMEN: soft, non distended  SKIN: no rashes or lesions  Spirometry:  Tracings reviewed. His effort: Good reproducible efforts. FVC: 2.79L,  76% predicted FEV1: 2.27L, 79% predicted FEV1/FVC ratio: 81% Interpretation: Spirometry consistent with normal pattern.  Please see scanned spirometry results for details.  Assessment:   1. Other allergic rhinitis   2. Other emphysema (HCC)   3. Nasal turbinate hypertrophy     Plan/Recommendations:  Other Allergic Rhinitis: - Due to turbinate hypertrophy, worsening symptoms and unresponsive to over the counter meds, will perform skin testing to identify aeroallergen triggers.   - Use nasal saline rinses before nose sprays such as with Neilmed Sinus Rinse.  Use distilled water .   - Use Flonase  2 sprays each nostril daily. Aim upward and outward. - Use Xyzal  5mg  daily.  -  Not a shot candidate- has Afib, COPD, CAD s/p CABG, on beta blocker.   COPD Current Tobacco Use - Work on smoking cessation - Continue follow up with Pulm: on Breztri  160-9-4.30mcg 2 puffs twice daily and as needed Albuterol .   Hold all anti-histamines (Xyzal , Allegra, Zyrtec , Claritin, Benadryl , Pepcid ) 3 days prior to next visit.  Follow up: 10 AM 10/8 for skin testing 1-55, no IDs   Arleta Blanch, MD [facility] 

## 2024-07-26 NOTE — Telephone Encounter (Signed)
 Patient communicated via mychart and clarified what was needed.  Message sent back via mychart.  Closing telephone encounter.

## 2024-07-26 NOTE — Telephone Encounter (Signed)
 Patient will stop 150 mg of Lyrica .  Prescription for 75 mg twice a day sent in

## 2024-08-02 ENCOUNTER — Ambulatory Visit: Admitting: Internal Medicine

## 2024-08-02 DIAGNOSIS — G629 Polyneuropathy, unspecified: Secondary | ICD-10-CM | POA: Diagnosis not present

## 2024-08-02 DIAGNOSIS — E119 Type 2 diabetes mellitus without complications: Secondary | ICD-10-CM | POA: Diagnosis not present

## 2024-08-02 DIAGNOSIS — G250 Essential tremor: Secondary | ICD-10-CM | POA: Diagnosis not present

## 2024-08-02 DIAGNOSIS — R2689 Other abnormalities of gait and mobility: Secondary | ICD-10-CM | POA: Diagnosis not present

## 2024-08-09 ENCOUNTER — Ambulatory Visit (INDEPENDENT_AMBULATORY_CARE_PROVIDER_SITE_OTHER): Admitting: Internal Medicine

## 2024-08-09 DIAGNOSIS — J3089 Other allergic rhinitis: Secondary | ICD-10-CM | POA: Diagnosis not present

## 2024-08-09 MED ORDER — AZELASTINE HCL 0.1 % NA SOLN: 2.0000 | Freq: Two times a day (BID) | NASAL | 5 refills | Status: AC | PRN

## 2024-08-09 NOTE — Patient Instructions (Addendum)
 Chronic Rhinitis: - SPT 07/2024: negative  - Use nasal saline rinses before nose sprays such as with Neilmed Sinus Rinse.  Use distilled water .   - Use Flonase  2 sprays each nostril daily. Aim upward and outward. - Use Azelastine  2 sprays each nostril twice daily as needed for congestion, drainage, sneezing.  Aim upward and outward.  - Use Xyzal  5mg  daily as needed for runny nose, sneezing, itchy watery eyes.   COPD Current Tobacco Use - Work on smoking cessation - Continue follow up with Pulm: on Breztri  160-9-4.10mcg 2 puffs twice daily and as needed Albuterol .

## 2024-08-09 NOTE — Progress Notes (Signed)
 FOLLOW UP Date of Service/Encounter:  08/09/24   Subjective:  [name] (DOB: [birth year]) is a 63 y.o. male who [facility] for follow up for skin testing.   History obtained from: chart review and patient.  Anti histamines held.   Past Medical History: Past Medical History:  Diagnosis Date   Aortic atherosclerosis    Atrial fibrillation (HCC)    a.) initially occurring in post-op setting in 05/2016, started on Eliquis ; b.) s/p LAA closure at time of CABG 08/24/2016 --> Eliquis  switched to Coumadin ; c.) CHA2DS2VASc = 4 (HFrEF, HTN, prior MI/vascular disease, T2DM) as of 02/29/2024; d.) s/p RFCA 12/20/2020; e.) rate/rhythm maintained on oral metoprolol  succinate; no OAC at this time   Basal cell carcinoma of skin    CAD (coronary artery disease)    a. 05/2016: NSTEMI, cath showing 3v disease with CABG recommended once MRSA infection resolved. b. 08/2016: CABG with LIMA-LAD, SVG-D1, and SVG-RI.   Cardiomegaly    CKD (chronic kidney disease)    Complication of anesthesia    tachycardia, rash, delayed emergence after cagb-pt aspirated after cyst removal   COPD (chronic obstructive pulmonary disease) (HCC)    DM (diabetes mellitus), type 2 (HCC)    Essential tremor    Gastroparesis    Headache(784.0)    Hematuria, microscopic SINCE I WAS A KID   HFrEF (heart failure with reduced ejection fraction) (HCC)    Hyperlipidemia    Hypertension    Ischemic cardiomyopathy    a. 05/2016: echo w/ EF of 25-35% and akinesis of the basal-midinferolateral and inferior myocardium.   Long-term use of aspirin  therapy    NSTEMI (non-ST elevated myocardial infarction) (HCC) 06/11/2016   Osteomyelitis of left foot (HCC)    Paresthesia and pain of both upper extremities    Peripheral neuropathy    Persistent depressive disorder    Pulmonary hypertension (HCC)    Respiratory failure (HCC)    RLS (restless legs syndrome)    a.) on ropinirole     Rotator cuff tear (BILATERAL)    S/P CABG x 3 08/24/2016   a.) LIMA-LAD, SVG-D1, SVG-RI.   S/P left atrial appendage ligation 08/24/2016   Sleep apnea    a.) unable to tolerate mask required for nocturnal PAP therapy   Thrombocytopenia    Tobacco use     Objective:  There were no vitals taken for this visit. There is no height or weight on file to calculate BMI. Physical Exam: GEN: alert, well developed HEENT: clear conjunctiva, MMM LUNGS: unlabored respiration  Skin Testing:  Skin prick testing was placed, which includes aeroallergens/foods, histamine control, and saline control.  Verbal consent was obtained prior to placing test.  Patient tolerated procedure well.  Allergy testing results were read and interpreted by myself, documented by clinical staff. Adequate positive and negative control.  Positive results to:  Results discussed with patient/family.  Airborne Adult Perc - 08/09/24 1015     Time Antigen Placed 1015    Allergen Manufacturer Jestine    Location Back    Number of Test 55    1. Control-Buffer 50% Glycerol Negative    2. Control-Histamine 2+    3. Bahia Negative    4. French Southern [place] Negative    5. Johnson Negative    6. [place]  Blue Negative    7. Meadow Fescue Negative    8. Perennial Rye Negative    9. Timothy Negative    10. Ragweed Mix Negative  11. Cocklebur Negative    12. Plantain,  English Negative    13. Baccharis Negative    14. Dog Fennel Negative    15. Russian Thistle Negative    16. Lamb's Quarters Negative    17. Sheep Sorrell Negative    18. Rough Pigweed Negative    19. Marsh Elder, Rough Negative    20. Mugwort, Common Negative    21. Box, Elder Negative    22. Cedar, red Negative    23. Sweet Gum Negative    24. Pecan Pollen Negative    25. Pine Mix Negative    26. Walnut, Black Pollen Negative    27. Red Mulberry Negative    28. Ash Mix Negative    29. Birch Mix Negative    30. Beech American Negative    31. Cottonwood,  [place] Negative    32. Hickory, White Negative    33. Maple Mix Negative    34. Oak, [place] Mix Negative    35. Sycamore Eastern Negative    36. Alternaria Alternata Negative    37. Cladosporium Herbarum Negative    38. Aspergillus Mix Negative    39. Penicillium Mix Negative    40. Bipolaris Sorokiniana (Helminthosporium) Negative    41. Drechslera Spicifera (Curvularia) Negative    42. Mucor Plumbeus Negative    43. Fusarium Moniliforme Negative    44. Aureobasidium Pullulans (pullulara) Negative    45. Rhizopus Oryzae Negative    46. Botrytis Cinera Negative    47. Epicoccum Nigrum Negative    48. Phoma Betae Negative    49. Dust Mite Mix Negative    50. Cat Hair 10,000 BAU/ml Negative    51.  Dog Epithelia Negative    52. Mixed Feathers Negative    53. Horse Epithelia Negative    54. Cockroach, German Negative    55. Tobacco Leaf Negative    1. Fire [employer] Omitted           Assessment:   1. Other allergic rhinitis     Plan/Recommendations:  Other Allergic Rhinitis: - Due to turbinate hypertrophy, worsening symptoms and unresponsive to over the counter meds, will perform skin testing to identify aeroallergen triggers.  - SPT 07/2024: negative  - Use nasal saline rinses before nose sprays such as with Neilmed Sinus Rinse.  Use distilled water .   - Use Flonase  2 sprays each nostril daily. Aim upward and outward. - Use Azelastine  2 sprays each nostril twice daily as needed for congestion, drainage, sneezing.  Aim upward and outward.  - Use Xyzal  5mg  daily as needed for runny nose, sneezing, itchy watery eyes.  -  Of note, not a shot candidate- has Afib, COPD, CAD s/p CABG, on beta blocker.   COPD Current Tobacco Use - Work on smoking cessation - Continue follow up with Pulm: on Breztri  160-9-4.47mcg 2 puffs twice daily and as needed Albuterol .      Return in about 6 months (around 02/07/2025).  Arleta Blanch, MD [facility]

## 2024-08-14 DIAGNOSIS — C44329 Squamous cell carcinoma of skin of other parts of face: Secondary | ICD-10-CM | POA: Diagnosis not present

## 2024-08-16 ENCOUNTER — Ambulatory Visit: Admitting: Podiatry

## 2024-08-25 ENCOUNTER — Ambulatory Visit: Admitting: Pulmonary Disease

## 2024-09-11 ENCOUNTER — Ambulatory Visit (INDEPENDENT_AMBULATORY_CARE_PROVIDER_SITE_OTHER): Admitting: Podiatry

## 2024-09-11 DIAGNOSIS — L84 Corns and callosities: Secondary | ICD-10-CM | POA: Diagnosis not present

## 2024-09-11 DIAGNOSIS — M79609 Pain in unspecified limb: Secondary | ICD-10-CM

## 2024-09-11 DIAGNOSIS — E1142 Type 2 diabetes mellitus with diabetic polyneuropathy: Secondary | ICD-10-CM

## 2024-09-11 DIAGNOSIS — B351 Tinea unguium: Secondary | ICD-10-CM

## 2024-09-12 ENCOUNTER — Encounter (INDEPENDENT_AMBULATORY_CARE_PROVIDER_SITE_OTHER): Payer: Self-pay | Admitting: Otolaryngology

## 2024-09-12 ENCOUNTER — Ambulatory Visit (INDEPENDENT_AMBULATORY_CARE_PROVIDER_SITE_OTHER): Admitting: Otolaryngology

## 2024-09-12 VITALS — BP 129/77 | HR 84 | Temp 98.0°F

## 2024-09-12 DIAGNOSIS — J342 Deviated nasal septum: Secondary | ICD-10-CM | POA: Diagnosis not present

## 2024-09-12 DIAGNOSIS — G4733 Obstructive sleep apnea (adult) (pediatric): Secondary | ICD-10-CM

## 2024-09-12 DIAGNOSIS — J343 Hypertrophy of nasal turbinates: Secondary | ICD-10-CM

## 2024-09-12 NOTE — Progress Notes (Signed)
 Reason for Consult: Nasal obstruction Referring Physician: Dr. Wendee Agent [name] is an 63 y.o. male.  HPI: Here for evaluation of nasal congestion and possible obstruction affecting his sleep apnea.  He is on a nasal steroid spray and has nose congestion.  During the day he really does not have a significant obstruction.  Does not have nasal drainage of any type.  No postnasal drip of purulent type.  He has trouble tolerating CPAP but is working with his doctor regarding his sleep apnea.  He was previously told he had a deviated septum and that that possibly might help him.  What he really wants to know is what it keep him from having to wear his CPAP.  He has severe obstructive sleep apnea.  He does not have any facial pressure or pain.  Past Medical History:  Diagnosis Date   Aortic atherosclerosis    Atrial fibrillation (HCC)    a.) initially occurring in post-op setting in 05/2016, started on Eliquis ; b.) s/p LAA closure at time of CABG 08/24/2016 --> Eliquis  switched to Coumadin ; c.) CHA2DS2VASc = 4 (HFrEF, HTN, prior MI/vascular disease, T2DM) as of 02/29/2024; d.) s/p RFCA 12/20/2020; e.) rate/rhythm maintained on oral metoprolol  succinate; no OAC at this time   Basal cell carcinoma of skin    CAD (coronary artery disease)    a. 05/2016: NSTEMI, cath showing 3v disease with CABG recommended once MRSA infection resolved. b. 08/2016: CABG with LIMA-LAD, SVG-D1, and SVG-RI.   Cardiomegaly    CKD (chronic kidney disease)    Complication of anesthesia    tachycardia, rash, delayed emergence after cagb-pt aspirated after cyst removal   COPD (chronic obstructive pulmonary disease) (HCC)    DM (diabetes mellitus), type 2 (HCC)    Essential tremor    Gastroparesis    Headache(784.0)    Hematuria, microscopic SINCE I WAS A KID   HFrEF (heart failure with reduced ejection fraction) (HCC)    Hyperlipidemia    Hypertension    Ischemic cardiomyopathy    a. 05/2016: echo w/ EF of 25-35% and  akinesis of the basal-midinferolateral and inferior myocardium.   Long-term use of aspirin  therapy    NSTEMI (non-ST elevated myocardial infarction) (HCC) 06/11/2016   Osteomyelitis of left foot (HCC)    Paresthesia and pain of both upper extremities    Peripheral neuropathy    Persistent depressive disorder    Pulmonary hypertension (HCC)    Respiratory failure (HCC)    RLS (restless legs syndrome)    a.) on ropinirole    Rotator cuff tear (BILATERAL)    S/P CABG x 3 08/24/2016   a.) LIMA-LAD, SVG-D1, SVG-RI.   S/P left atrial appendage ligation 08/24/2016   Sleep apnea    a.) unable to tolerate mask required for nocturnal PAP therapy   Thrombocytopenia    Tobacco use     Past Surgical History:  Procedure Laterality Date   AMPUTATION TOE Left 02/06/2020   5th ray amputation with graph [facility])   BRONCHIAL BRUSHINGS  08/01/2022   Procedure: BRONCHIAL BRUSHINGS;  Surgeon: Claudene Toribio BROCKS, MD;  Location: [facility] ENDOSCOPY;  Service: Pulmonary;;   BRONCHIAL WASHINGS  08/01/2022   Procedure: BRONCHIAL WASHINGS;  Surgeon: Claudene Toribio BROCKS, MD;  Location: [facility] ENDOSCOPY;  Service: Pulmonary;;   CARDIAC CATHETERIZATION N/A 06/16/2016   Procedure: Left Heart Cath and Coronary Angiography;  Surgeon: Ned DELENA Sor, MD;  Location: [facility] INVASIVE CV LAB;  Service: Cardiovascular;  Laterality: N/A;   CLIPPING OF ATRIAL APPENDAGE  N/A 08/24/2016   Procedure: CLIPPING OF ATRIAL APPENDAGE;  Surgeon: Maude Fleeta Ochoa, MD;  Location: [facility] OR;  Service: Open Heart Surgery;  Laterality: N/A;   COLONOSCOPY WITH PROPOFOL  N/A 02/12/2022   Procedure: COLONOSCOPY WITH PROPOFOL ;  Surgeon: Jinny Carmine, MD;  Location: [facility] ENDOSCOPY;  Service: Endoscopy;  Laterality: N/A;   CORONARY ARTERY BYPASS GRAFT N/A 08/24/2016   Procedure: CORONARY ARTERY BYPASS GRAFTING (CABG) x three,  using left internal mammary artery and right leg greater saphenous vein harvested endoscopically;  Surgeon: Maude Fleeta Ochoa, MD;  Location: [facility] OR;   Service: Open Heart Surgery;  Laterality: N/A;   HERNIA REPAIR  X2   INCISION AND DRAINAGE ABSCESS Left 06/10/2016   Procedure: INCISION AND DRAINAGE LEFT AXILLARY ABSCESS;  Surgeon: Deward Null III, MD;  Location: [facility] ORS;  Service: General;  Laterality: Left;   REVERSE SHOULDER ARTHROPLASTY Left 03/02/2024   Procedure: ARTHROPLASTY, SHOULDER, TOTAL, REVERSE;  Surgeon: Edie Norleen PARAS, MD;  Location: [facility] ORS;  Service: Orthopedics;  Laterality: Left;   ROTATOR CUFF REPAIR Bilateral    TEE WITHOUT CARDIOVERSION N/A 08/24/2016   Procedure: TRANSESOPHAGEAL ECHOCARDIOGRAM (TEE);  Surgeon: Maude Fleeta Ochoa, MD;  Location: [facility] OR;  Service: Open Heart Surgery;  Laterality: N/A;   VIDEO BRONCHOSCOPY Right 08/01/2022   Procedure: VIDEO BRONCHOSCOPY WITH FLUORO;  Surgeon: Claudene Toribio BROCKS, MD;  Location: [facility] ENDOSCOPY;  Service: Pulmonary;  Laterality: Right;    Family History  Problem Relation Age of Onset   Hypertension Mother    Breast cancer Mother    Hypertension Father    Sudden death Father 86       at age of death   Diabetes Father    Heart attack Father    Heart attack Sister    Cancer Maternal Aunt        breast x2 aunts    Social History:  reports that he has been smoking cigarettes. He has a 63 pack-year smoking history. He has never used smokeless tobacco. He reports that he does not currently use alcohol. He reports that he does not use drugs.  Allergies:  Allergies  Allergen Reactions   Latex Rash   Lactose Intolerance (Gi)     Medications: I have reviewed the patient's current medications.  No results found for this or any previous visit (from the past 48 hours).  No results found.  ROS Blood pressure 129/77, pulse 84, temperature 98 F (36.7 C), SpO2 98%. Physical Exam Constitutional:      Appearance: Normal appearance.  HENT:     Head: Normocephalic and atraumatic.     Right Ear: Tympanic membrane is without lesions and middle ear aerated, ear canal and external ear  normal.     Left Ear: Tympanic membrane is without lesions and middle ear aerated, ear canal and external ear normal.     Nose: Nose the septum is deviated to the left inferiorly and anteriorly.  The turbinates are both moderately hypertrophied, No significant swelling or masses.     Oral cavity/oropharynx: Mucous membranes are moist. No lesions or masses    Larynx: normal voice. Mirror attempted without success    Eyes:     Extraocular Movements: Extraocular movements intact.     Conjunctiva/sclera: Conjunctivae normal.     Pupils: Pupils are equal, round, and reactive to light.  Cardiovascular:     Rate and Rhythm: Normal rate.  Pulmonary:     Effort: Pulmonary effort is normal.  Musculoskeletal:     Cervical back:  Normal range of motion and neck supple. No rigidity.  Lymphadenopathy:     Cervical: No cervical adenopathy or masses.salivary glands without lesions. .  Neurological:     Mental Status: He is alert. CN 2-12 intact. No nystagmus      Assessment/Plan: Obstructive sleep apnea-I reassured him that a nasal septoplasty would not fix his obstructive sleep apnea so he still would need the CPAP.  If he had a deviated septum and turbinate repair then he might be able to tolerate the CPAP better but it would not fix his need for it.  With that knowledge he does not want any surgical intervention.  It does not sound like he is having sinusitis.  I would recommend he follow-up with his sleep apnea doctor to further work on tolerance and come back if he wants to proceed with the septoplasty and turbinate reduction.  Norleen Notice 09/12/2024, 1:14 PM

## 2024-09-13 NOTE — Progress Notes (Signed)
  Subjective:  Patient ID: [name], male    DOB: 07/09/1961,  MRN: [id number]  Chief Complaint  Patient presents with   Nail Problem    Thick painful toenails, 4 month follow up     63 y.o. male presents with the above complaint. History confirmed with patient.  He returns for follow-up.  Nails painful thick and elongated, the callus on the fifth metatarsal right foot has built up again and causing pain as well as the right great toe  Objective:  Physical Exam: warm, good capillary refill, no trophic changes or ulcerative lesions, normal DP and PT pulses, and he has an abnormal sensory exam with loss of protective sensation.  Previous left partial fifth ray amputation Left Foot: dystrophic yellowed discolored nail plates with subungual debris Right Foot: dystrophic yellowed discolored nail plates with subungual debris and plantar fifth metatarsal and medial hallux and MTP joint there are a painful hyperkeratoses  Radiographs of both feet taken today show no osteomyelitis of the areas of concern of the fifth metatarsal on the right foot or left fourth toe  Assessment:   1. Pain due to onychomycosis of nail   2. Callus   3. Type 2 diabetes mellitus with peripheral neuropathy (HCC)       Plan:  Patient was evaluated and treated and all questions answered.  Discussed the etiology and treatment options for the condition in detail with the patient. Prior debridements have been helpful. Recommended debridement of the nails today. Sharp and mechanical debridement performed of all painful and mycotic nails today. Nails debrided in length and thickness using a nail nipper to level of comfort. Discussed treatment options including appropriate shoe gear. Follow up as needed for painful nails.  All symptomatic hyperkeratoses were safely debrided with a sterile #15 blade to patient's level of comfort without incident.  Regular debridement has been helpful in reducing pain and improving  function    Return in about 12 weeks (around 12/04/2024) for at risk diabetic foot care.

## 2024-09-27 ENCOUNTER — Other Ambulatory Visit: Payer: Self-pay | Admitting: Student

## 2024-09-27 DIAGNOSIS — Z96612 Presence of left artificial shoulder joint: Secondary | ICD-10-CM

## 2024-09-27 DIAGNOSIS — M25512 Pain in left shoulder: Secondary | ICD-10-CM

## 2024-09-27 DIAGNOSIS — S42125A Nondisplaced fracture of acromial process, left shoulder, initial encounter for closed fracture: Secondary | ICD-10-CM

## 2024-09-27 NOTE — Progress Notes (Signed)
 [name]                                          MRN: [id number]   09/27/2024   The [facility] Quality Team Specialist reviewed this patient medical record for the purposes of chart review for care gap closure. The following were reviewed: abstraction for care gap closure-glycemic status assessment.    [facility] Quality Team

## 2024-09-30 ENCOUNTER — Ambulatory Visit
Admission: RE | Admit: 2024-09-30 | Discharge: 2024-09-30 | Disposition: A | Source: Ambulatory Visit | Attending: Student

## 2024-09-30 DIAGNOSIS — S42125A Nondisplaced fracture of acromial process, left shoulder, initial encounter for closed fracture: Secondary | ICD-10-CM

## 2024-09-30 DIAGNOSIS — M25512 Pain in left shoulder: Secondary | ICD-10-CM

## 2024-09-30 DIAGNOSIS — Z96612 Presence of left artificial shoulder joint: Secondary | ICD-10-CM

## 2024-10-22 ENCOUNTER — Other Ambulatory Visit: Payer: Self-pay | Admitting: Pulmonary Disease

## 2024-11-01 ENCOUNTER — Encounter: Payer: Self-pay | Admitting: Pulmonary Disease

## 2024-11-01 ENCOUNTER — Ambulatory Visit: Admitting: Pulmonary Disease

## 2024-11-01 VITALS — BP 134/76 | HR 77 | Temp 97.4°F | Ht 65.0 in | Wt 167.6 lb

## 2024-11-01 DIAGNOSIS — G629 Polyneuropathy, unspecified: Secondary | ICD-10-CM | POA: Diagnosis not present

## 2024-11-01 DIAGNOSIS — J449 Chronic obstructive pulmonary disease, unspecified: Secondary | ICD-10-CM | POA: Diagnosis not present

## 2024-11-01 DIAGNOSIS — F17210 Nicotine dependence, cigarettes, uncomplicated: Secondary | ICD-10-CM | POA: Diagnosis not present

## 2024-11-01 DIAGNOSIS — G4733 Obstructive sleep apnea (adult) (pediatric): Secondary | ICD-10-CM | POA: Diagnosis not present

## 2024-11-01 DIAGNOSIS — G2581 Restless legs syndrome: Secondary | ICD-10-CM

## 2024-11-01 MED ORDER — TIOTROPIUM BROMIDE 18 MCG IN CAPS: 18.0000 ug | ORAL_CAPSULE | Freq: Every day | RESPIRATORY_TRACT | 2 refills | Status: AC

## 2024-11-01 NOTE — Patient Instructions (Addendum)
 Order for Spiriva placed Spiriva use 1 inhalation daily  This will be in place of the Breztri   Continue to monitor your symptoms if you feel there is worsening of the cough and congestion, feeling under the weather - Make sure you give us  a call and we can call in an antibiotic  Continue to work on cutting down his smoking to quitting  Make sure you have albuterol  to use as needed albuterol  can be used up to 4 times a day for shortness of breath and wheezing  Your last sleep study was in 2022 showing severe obstructive sleep apnea, your intolerance to CPAP in the past.  If there is ongoing concern, we will need to repeat the study to ascertain if it is still severe  Follow-up in about 3 months

## 2024-11-01 NOTE — Progress Notes (Signed)
 "              [name]    [id number]    [birth year]  Primary Care Physician:Cable, Lynwood HERO, NP  Referring Physician: Wendee Lynwood HERO, NP [address] [place],  [place] [address]  Chief complaint:   Patient in for follow-up for obstructive sleep apnea History of COPD  HPI: Recovering from a upper respiratory infection that he felt he may have caught from his grandkids Is still coughing, bringing up clear phlegm occasionally brownish phlegm Denies any chest pains or chest discomfort  He had recently followed up with ENT Dr. Roark, noted to have a deviated septum, surgical options discussed - Not inclined to move forward with surgical options at present  He did not tolerate CPAP previously and not willing to go back to using CPAP at present  He has a history of restless legs for which he is on , Lyrica .  Patient with severe obstructive sleep apnea for which he tried CPAP Intolerant of CPAP because of chronic nasal stuffiness and congestion  He has history of atrial fibrillation, diabetes, coronary artery disease Continues to use Breztri  Uses Flonase  for nasal stuffiness, Zyrtec   He felt CPAP was preventing him from sleeping rather than helping him at all   he continues to smoke about a pack a day    Outpatient Encounter Medications as of 11/01/2024  Medication Sig   acetaminophen  (TYLENOL ) 500 MG tablet Take 1,000 mg by mouth every 6 (six) hours as needed for headache.   albuterol  (VENTOLIN  HFA) 108 (90 Base) MCG/ACT inhaler Inhale 1-2 puffs into the lungs every 6 (six) hours as needed for wheezing or shortness of breath.   aspirin  81 MG chewable tablet Chew 1 tablet (81 mg total) by mouth daily.   atorvastatin  (LIPITOR ) 80 MG tablet Take 1 tablet (80 mg total) by mouth at bedtime.   azelastine  (ASTELIN ) 0.1 % nasal spray Place 2 sprays into both nostrils 2 (two) times daily as needed for rhinitis. Use in each nostril as directed   citalopram  (CELEXA ) 10 MG tablet Take  10 mg by mouth every morning. Take with 20 to equal 30 mg daily   citalopram  (CELEXA ) 20 MG tablet Take 20 mg by mouth every morning. Take with 10 to equal 30 mg daily   ezetimibe  (ZETIA ) 10 MG tablet Take 1 tablet by mouth every morning.   fluticasone  (FLONASE ) 50 MCG/ACT nasal spray Place 2 sprays into both nostrils 2 (two) times daily.   furosemide  (LASIX ) 20 MG tablet Take 1 tablet (20 mg total) by mouth daily as needed.   glucose blood test strip 1 each by Other route as needed for other. For one touch ultra meter. DX: E11.9   HUMALOG KWIKPEN 100 UNIT/ML KwikPen Inject 20 Units into the skin 3 (three) times daily with meals.   Insulin  Pen Needle 32G X 4 MM MISC Use to inject Insulin  daily. Dx: E11.9   Insulin  Syringe-Needle U-100 (INSULIN  SYRINGE .3CC/31GX5/16) 31G X 5/16 0.3 ML MISC 1 Units by Does not apply route 2 (two) times daily.   Lancets (ONETOUCH ULTRASOFT) lancets Use as instructed (Patient taking differently: 1 each as needed. Use as instructed)   LANTUS  SOLOSTAR 100 UNIT/ML Solostar Pen Inject 32 Units into the skin 2 (two) times daily.   levocetirizine (XYZAL  ALLERGY  24HR) 5 MG tablet Take 1 tablet (5 mg total) by mouth every evening.   metoprolol  succinate (TOPROL -XL) 100 MG 24 hr tablet Take 100 mg by  mouth every morning.   mupirocin  ointment (BACTROBAN ) 2 % Apply topically 3 (three) times daily.   nitroGLYCERIN  (NITROSTAT ) 0.4 MG SL tablet Place 1 tablet (0.4 mg total) under the tongue every 5 (five) minutes as needed for chest pain.   oxyCODONE  (ROXICODONE ) 5 MG immediate release tablet Take 1-2 tablets (5-10 mg total) by mouth every 4 (four) hours as needed for moderate pain (pain score 4-6) or severe pain (pain score 7-10).   pregabalin  (LYRICA ) 75 MG capsule TAKE 1 CAPSULE BY MOUTH TWICE DAILY   rOPINIRole  (REQUIP ) 0.5 MG tablet TAKE 1 TABLET BY MOUTH THREE TIMES DAILY   sacubitril -valsartan  (ENTRESTO ) 24-26 MG Take 1 tablet by mouth daily. 0.5 tablet daily   sitaGLIPtin  (JANUVIA) 100 MG tablet Take 1 tablet by mouth daily.   Tiotropium Bromide  (SPIRIVA HANDIHALER) 18 MCG CAPS Place 18 mcg into inhaler and inhale daily.   tirzepatide (MOUNJARO) 2.5 MG/0.5ML Pen Inject 2.5 mg into the skin every Wednesday.   [DISCONTINUED] BREZTRI  AEROSPHERE 160-9-4.8 MCG/ACT AERO inhaler INHALE 2 PUFFS IN THE MORNING AND AT BEDTIME   No facility-administered encounter medications on file as of 11/01/2024.    Allergies as of 11/01/2024 - Review Complete 11/01/2024  Allergen Reaction Noted   Latex Rash 06/16/2016   Lactose intolerance (gi)  02/29/2024    Past Medical History:  Diagnosis Date   Aortic atherosclerosis    Atrial fibrillation (HCC)    a.) initially occurring in post-op setting in 05/2016, started on Eliquis ; b.) s/p LAA closure at time of CABG 08/24/2016 --> Eliquis  switched to Coumadin ; c.) CHA2DS2VASc = 4 (HFrEF, HTN, prior MI/vascular disease, T2DM) as of 02/29/2024; d.) s/p RFCA 12/20/2020; e.) rate/rhythm maintained on oral metoprolol  succinate; no OAC at this time   Basal cell carcinoma of skin    CAD (coronary artery disease)    a. 05/2016: NSTEMI, cath showing 3v disease with CABG recommended once MRSA infection resolved. b. 08/2016: CABG with LIMA-LAD, SVG-D1, and SVG-RI.   Cardiomegaly    CKD (chronic kidney disease)    Complication of anesthesia    tachycardia, rash, delayed emergence after cagb-pt aspirated after cyst removal   COPD (chronic obstructive pulmonary disease) (HCC)    DM (diabetes mellitus), type 2 (HCC)    Essential tremor    Gastroparesis    Headache(784.0)    Hematuria, microscopic SINCE I WAS A KID   HFrEF (heart failure with reduced ejection fraction) (HCC)    Hyperlipidemia    Hypertension    Ischemic cardiomyopathy    a. 05/2016: echo w/ EF of 25-35% and akinesis of the basal-midinferolateral and inferior myocardium.   Long-term use of aspirin  therapy    NSTEMI (non-ST elevated myocardial infarction) (HCC) 06/11/2016    Osteomyelitis of left foot (HCC)    Paresthesia and pain of both upper extremities    Peripheral neuropathy    Persistent depressive disorder    Pulmonary hypertension (HCC)    Respiratory failure (HCC)    RLS (restless legs syndrome)    a.) on ropinirole    Rotator cuff tear (BILATERAL)    S/P CABG x 3 08/24/2016   a.) LIMA-LAD, SVG-D1, SVG-RI.   S/P left atrial appendage ligation 08/24/2016   Sleep apnea    a.) unable to tolerate mask required for nocturnal PAP therapy   Thrombocytopenia    Tobacco use     Past Surgical History:  Procedure Laterality Date   AMPUTATION TOE Left 02/06/2020   5th ray amputation with graph [facility])   BRONCHIAL  BRUSHINGS  08/01/2022   Procedure: BRONCHIAL BRUSHINGS;  Surgeon: Claudene Toribio BROCKS, MD;  Location: [facility] ENDOSCOPY;  Service: Pulmonary;;   BRONCHIAL WASHINGS  08/01/2022   Procedure: BRONCHIAL WASHINGS;  Surgeon: Claudene Toribio BROCKS, MD;  Location: [facility] ENDOSCOPY;  Service: Pulmonary;;   CARDIAC CATHETERIZATION N/A 06/16/2016   Procedure: Left Heart Cath and Coronary Angiography;  Surgeon: Debby DELENA Sor, MD;  Location: [facility] INVASIVE CV LAB;  Service: Cardiovascular;  Laterality: N/A;   CLIPPING OF ATRIAL APPENDAGE N/A 08/24/2016   Procedure: CLIPPING OF ATRIAL APPENDAGE;  Surgeon: Maude Fleeta Ochoa, MD;  Location: [facility] OR;  Service: Open Heart Surgery;  Laterality: N/A;   COLONOSCOPY WITH PROPOFOL  N/A 02/12/2022   Procedure: COLONOSCOPY WITH PROPOFOL ;  Surgeon: Jinny Carmine, MD;  Location: [facility] ENDOSCOPY;  Service: Endoscopy;  Laterality: N/A;   CORONARY ARTERY BYPASS GRAFT N/A 08/24/2016   Procedure: CORONARY ARTERY BYPASS GRAFTING (CABG) x three,  using left internal mammary artery and right leg greater saphenous vein harvested endoscopically;  Surgeon: Maude Fleeta Ochoa, MD;  Location: [facility] OR;  Service: Open Heart Surgery;  Laterality: N/A;   HERNIA REPAIR  X2   INCISION AND DRAINAGE ABSCESS Left 06/10/2016   Procedure: INCISION AND DRAINAGE LEFT AXILLARY  ABSCESS;  Surgeon: Deward Null III, MD;  Location: [facility] ORS;  Service: General;  Laterality: Left;   REVERSE SHOULDER ARTHROPLASTY Left 03/02/2024   Procedure: ARTHROPLASTY, SHOULDER, TOTAL, REVERSE;  Surgeon: Edie Norleen PARAS, MD;  Location: [facility] ORS;  Service: Orthopedics;  Laterality: Left;   ROTATOR CUFF REPAIR Bilateral    TEE WITHOUT CARDIOVERSION N/A 08/24/2016   Procedure: TRANSESOPHAGEAL ECHOCARDIOGRAM (TEE);  Surgeon: Maude Fleeta Ochoa, MD;  Location: [facility] OR;  Service: Open Heart Surgery;  Laterality: N/A;   VIDEO BRONCHOSCOPY Right 08/01/2022   Procedure: VIDEO BRONCHOSCOPY WITH FLUORO;  Surgeon: Claudene Toribio BROCKS, MD;  Location: [facility] ENDOSCOPY;  Service: Pulmonary;  Laterality: Right;    Family History  Problem Relation Age of Onset   Hypertension Mother    Breast cancer Mother    Hypertension Father    Sudden death Father 52       at age of death   Diabetes Father    Heart attack Father    Heart attack Sister    Cancer Maternal Aunt        breast x2 aunts    Social History   Socioeconomic History   Marital status: Married    Spouse name: Not on file   Number of children: Not on file   Years of education: Not on file   Highest education level: Not on file  Occupational History   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 am  Vaping Use   Vaping status: Never Used  Substance and Sexual Activity   Alcohol use: Not Currently   Drug use: No   Sexual activity: Not on file  Other Topics Concern   Not on file  Social History Narrative   Not on file   Social Drivers of Health   Tobacco Use: High Risk (11/01/2024)   Patient History    Smoking Tobacco Use: Every Day    Smokeless Tobacco Use: Never    Passive  Exposure: Not on file  Financial Resource Strain: Low Risk  (08/30/2024)   Received from [facility]   Overall Financial Resource Strain (CARDIA)    Difficulty of Paying Living Expenses: Not hard at all  Food Insecurity: No Food Insecurity (08/30/2024)   Received from [facility]   Epic    Within the past 12 months, you worried that your food would run out before you got the money to buy more.: Never true    Within the past 12 months, the food you bought just didn't last and you didn't have money to get more.: Never true  Transportation Needs: No Transportation Needs (08/30/2024)   Received from [facility] - Transportation    In the past 12 months, has lack of transportation kept you from medical appointments or from getting medications?: No    Lack of Transportation (Non-Medical): No  Physical Activity: Inactive (03/08/2024)   Exercise Vital Sign    Days of Exercise per Week: 0 days    Minutes of Exercise per Session: 0 min  Stress: No Stress Concern Present (03/08/2024)   [employer] - Occupational Stress Questionnaire    Feeling of Stress : Only a little  Social Connections: Socially Isolated (03/08/2024)   Social Connection and Isolation Panel    Frequency of Communication with Friends and Family: More than three times a week    Frequency of Social Gatherings with Friends and Family: More than three times a week    Attends Religious Services: Never    Database Administrator or Organizations: No    Attends Banker Meetings: Never    Marital Status: Separated  Intimate Partner Violence: Not At Risk (03/08/2024)   Humiliation, Afraid, Rape, and Kick questionnaire    Fear of Current or Ex-Partner: No    Emotionally Abused: No    Physically Abused: No    Sexually Abused: No  Depression (PHQ2-9): High Risk (05/31/2024)   Depression (PHQ2-9)    PHQ-2 Score: 13  Alcohol Screen: Low  Risk (03/08/2024)   Alcohol Screen    Last Alcohol Screening Score (AUDIT): 0  Housing: Low Risk  (08/30/2024)   Received from [facility]   Epic    In the last 12 months, was there a time when you were not able to pay the mortgage or rent on time?: No    In the past 12 months, how many times have you moved where you were living?: 0    At any time in the past 12 months, were you homeless or living in a shelter (including now)?: No  Utilities: Not At Risk (08/30/2024)   Received from [facility]   Epic    In the past 12 months has the electric, gas, oil, or water  company threatened to shut off services in your home?: No  Health Literacy: Inadequate Health Literacy (03/08/2024)   B1300 Health Literacy    Frequency of need for help with medical instructions: Sometimes    Review of Systems  Respiratory:  Positive for apnea and shortness of breath.   Psychiatric/Behavioral:  Positive for sleep disturbance.     Vitals:   11/01/24 1122  BP: 134/76  Pulse: 77  Temp: (!) 97.4 F (36.3 C)  SpO2: 95%   Physical Exam Constitutional:      Appearance: Normal appearance.  HENT:     Head: Normocephalic.     Mouth/Throat:  Mouth: Mucous membranes are moist.  Eyes:     General: No scleral icterus. Cardiovascular:     Rate and Rhythm: Normal rate and regular rhythm.     Heart sounds: No murmur heard.    No friction rub.  Pulmonary:     Effort: No respiratory distress.     Breath sounds: No stridor. No wheezing or rhonchi.  Musculoskeletal:     Cervical back: No rigidity or tenderness.  Neurological:     Mental Status: He is alert.  Psychiatric:        Mood and Affect: Mood normal.    Data Reviewed:  Pulmonary function test with moderate obstructive disease with significant bronchodilator response  Previous sleep study was in 2022 showing an AHI of 56 O2 nadir 42%  Last echocardiogram 07/31/2022 - Mild pulmonary hypertension  Low-dose  CT 04/20/2024 - No significant abnormality - Follow-up a year from now  Records from recent visit with Dr. Roark reviewed  Assessment:   Severe obstructive sleep apnea Unable to tolerate CPAP -followed up with ENT-intervention options was discussed with him and he wants to wait this out at present  Neuropathy RLS - On Lyrica   Active smoker - Counseled about the need to quit smoking  Obstructive lung disease Continue Breztri    Plan/Recommendations:  Prescription placed for Spiriva in place of Breztri , felt Breztri  did not help  Continue Lyrica   Albuterol  as needed  Encouraged to call with significant concerns  Smoking cessation counseling He will continue to work on this  If he changes his mind about CPAP therapy treatments Will need a new study to confirm he still has significant sleep disordered breathing  Jennet Epley MD [facility] 11/01/2024, 12:56 PM  CC: Wendee Lynwood HERO, NP   "

## 2024-11-10 NOTE — Progress Notes (Signed)
 [name]                                          MRN: [id number]   11/10/2024   The [facility] Quality Team Specialist reviewed this patient medical record for the purposes of chart review for care gap closure. The following were reviewed: chart review for care gap closure-glycemic status assessment.    [facility] Quality Team

## 2024-12-01 ENCOUNTER — Ambulatory Visit: Admitting: Nurse Practitioner

## 2024-12-06 ENCOUNTER — Ambulatory Visit: Admitting: Podiatry

## 2025-02-07 ENCOUNTER — Ambulatory Visit: Admitting: Internal Medicine

## 2025-02-08 ENCOUNTER — Ambulatory Visit: Admitting: Pulmonary Disease

## 2025-03-09 ENCOUNTER — Ambulatory Visit
